# Patient Record
Sex: Female | Born: 1950 | Race: White | Hispanic: No | Marital: Married | Smoking: Never smoker
Health system: Southern US, Community
[De-identification: ages and names within clinical notes are randomized; demographics above are authoritative.]

## PROBLEM LIST (undated history)

## (undated) DIAGNOSIS — M545 Low back pain, unspecified: Secondary | ICD-10-CM

## (undated) DIAGNOSIS — R87810 Cervical high risk human papillomavirus (HPV) DNA test positive: Secondary | ICD-10-CM

## (undated) DIAGNOSIS — M1990 Unspecified osteoarthritis, unspecified site: Secondary | ICD-10-CM

## (undated) DIAGNOSIS — N319 Neuromuscular dysfunction of bladder, unspecified: Secondary | ICD-10-CM

## (undated) DIAGNOSIS — E119 Type 2 diabetes mellitus without complications: Secondary | ICD-10-CM

## (undated) DIAGNOSIS — F419 Anxiety disorder, unspecified: Secondary | ICD-10-CM

## (undated) DIAGNOSIS — F329 Major depressive disorder, single episode, unspecified: Secondary | ICD-10-CM

## (undated) DIAGNOSIS — I509 Heart failure, unspecified: Secondary | ICD-10-CM

## (undated) DIAGNOSIS — N189 Chronic kidney disease, unspecified: Secondary | ICD-10-CM

## (undated) DIAGNOSIS — G8929 Other chronic pain: Secondary | ICD-10-CM

## (undated) DIAGNOSIS — F32A Depression, unspecified: Secondary | ICD-10-CM

## (undated) DIAGNOSIS — Z950 Presence of cardiac pacemaker: Secondary | ICD-10-CM

## (undated) DIAGNOSIS — K219 Gastro-esophageal reflux disease without esophagitis: Secondary | ICD-10-CM

## (undated) DIAGNOSIS — K5900 Constipation, unspecified: Secondary | ICD-10-CM

## (undated) DIAGNOSIS — E7800 Pure hypercholesterolemia, unspecified: Secondary | ICD-10-CM

## (undated) DIAGNOSIS — R011 Cardiac murmur, unspecified: Secondary | ICD-10-CM

## (undated) DIAGNOSIS — I10 Essential (primary) hypertension: Secondary | ICD-10-CM

## (undated) DIAGNOSIS — K5909 Other constipation: Secondary | ICD-10-CM

## (undated) HISTORY — DX: Cervical high risk human papillomavirus (HPV) DNA test positive: R87.810

## (undated) HISTORY — PX: SIGMOIDOSCOPY: SUR1295

## (undated) HISTORY — PX: UPPER GASTROINTESTINAL ENDOSCOPY: SHX188

## (undated) HISTORY — PX: CATARACT EXTRACTION: SUR2

## (undated) HISTORY — PX: HERNIA REPAIR: SHX51

## (undated) HISTORY — DX: Essential (primary) hypertension: I10

## (undated) HISTORY — DX: Gastro-esophageal reflux disease without esophagitis: K21.9

## (undated) HISTORY — DX: Other constipation: K59.09

## (undated) HISTORY — PX: BREAST EXCISIONAL BIOPSY: SUR124

## (undated) HISTORY — PX: WISDOM TOOTH EXTRACTION: SHX21

## (undated) HISTORY — PX: POSTERIOR LUMBAR FUSION: SHX6036

## (undated) HISTORY — PX: CHOLECYSTECTOMY: SHX55

## (undated) HISTORY — DX: Constipation, unspecified: K59.00

## (undated) HISTORY — DX: Pure hypercholesterolemia, unspecified: E78.00

## (undated) HISTORY — PX: TUBAL LIGATION: SHX77

---
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
## 2001-11-01 ENCOUNTER — Other Ambulatory Visit: Admission: RE | Admit: 2001-11-01 | Discharge: 2001-11-01 | Payer: Self-pay | Admitting: Obstetrics and Gynecology

## 2001-11-01 ENCOUNTER — Ambulatory Visit (HOSPITAL_COMMUNITY): Admission: RE | Admit: 2001-11-01 | Discharge: 2001-11-01 | Payer: Self-pay | Admitting: Obstetrics and Gynecology

## 2001-11-01 ENCOUNTER — Encounter: Payer: Self-pay | Admitting: Obstetrics and Gynecology

## 2001-11-03 ENCOUNTER — Ambulatory Visit (HOSPITAL_COMMUNITY): Admission: RE | Admit: 2001-11-03 | Discharge: 2001-11-03 | Payer: Self-pay | Admitting: Obstetrics and Gynecology

## 2001-11-03 ENCOUNTER — Encounter: Payer: Self-pay | Admitting: Obstetrics and Gynecology

## 2002-08-15 ENCOUNTER — Ambulatory Visit (HOSPITAL_COMMUNITY): Admission: RE | Admit: 2002-08-15 | Discharge: 2002-08-15 | Payer: Self-pay | Admitting: Pulmonary Disease

## 2002-08-22 ENCOUNTER — Ambulatory Visit (HOSPITAL_COMMUNITY): Admission: RE | Admit: 2002-08-22 | Discharge: 2002-08-22 | Payer: Self-pay | Admitting: Pulmonary Disease

## 2002-09-12 ENCOUNTER — Ambulatory Visit (HOSPITAL_COMMUNITY): Admission: RE | Admit: 2002-09-12 | Discharge: 2002-09-12 | Payer: Self-pay | Admitting: Cardiology

## 2002-11-01 ENCOUNTER — Ambulatory Visit (HOSPITAL_COMMUNITY): Admission: RE | Admit: 2002-11-01 | Discharge: 2002-11-01 | Payer: Self-pay | Admitting: Obstetrics and Gynecology

## 2002-11-01 ENCOUNTER — Encounter: Payer: Self-pay | Admitting: Obstetrics and Gynecology

## 2002-11-02 ENCOUNTER — Encounter: Payer: Self-pay | Admitting: Obstetrics and Gynecology

## 2002-11-02 ENCOUNTER — Ambulatory Visit (HOSPITAL_COMMUNITY): Admission: RE | Admit: 2002-11-02 | Discharge: 2002-11-02 | Payer: Self-pay | Admitting: Obstetrics and Gynecology

## 2003-01-25 ENCOUNTER — Ambulatory Visit (HOSPITAL_COMMUNITY): Admission: RE | Admit: 2003-01-25 | Discharge: 2003-01-25 | Payer: Self-pay | Admitting: Pulmonary Disease

## 2003-04-30 ENCOUNTER — Ambulatory Visit (HOSPITAL_COMMUNITY): Admission: RE | Admit: 2003-04-30 | Discharge: 2003-04-30 | Payer: Self-pay | Admitting: *Deleted

## 2003-06-15 ENCOUNTER — Ambulatory Visit (HOSPITAL_COMMUNITY): Admission: RE | Admit: 2003-06-15 | Discharge: 2003-06-15 | Payer: Self-pay | Admitting: Pulmonary Disease

## 2003-07-13 ENCOUNTER — Encounter: Admission: RE | Admit: 2003-07-13 | Discharge: 2003-07-13 | Payer: Self-pay | Admitting: Neurosurgery

## 2003-07-31 ENCOUNTER — Encounter: Admission: RE | Admit: 2003-07-31 | Discharge: 2003-07-31 | Payer: Self-pay | Admitting: Neurosurgery

## 2003-09-15 HISTORY — PX: BACK SURGERY: SHX140

## 2003-10-03 ENCOUNTER — Inpatient Hospital Stay (HOSPITAL_COMMUNITY): Admission: RE | Admit: 2003-10-03 | Discharge: 2003-10-05 | Payer: Self-pay | Admitting: Neurosurgery

## 2003-11-13 ENCOUNTER — Ambulatory Visit (HOSPITAL_COMMUNITY): Admission: RE | Admit: 2003-11-13 | Discharge: 2003-11-13 | Payer: Self-pay | Admitting: Obstetrics and Gynecology

## 2003-11-14 ENCOUNTER — Ambulatory Visit (HOSPITAL_COMMUNITY): Admission: RE | Admit: 2003-11-14 | Discharge: 2003-11-14 | Payer: Self-pay | Admitting: Obstetrics and Gynecology

## 2004-03-19 ENCOUNTER — Encounter (HOSPITAL_COMMUNITY): Admission: RE | Admit: 2004-03-19 | Discharge: 2004-04-18 | Payer: Self-pay | Admitting: Neurosurgery

## 2004-08-28 ENCOUNTER — Ambulatory Visit: Payer: Self-pay | Admitting: *Deleted

## 2004-09-11 ENCOUNTER — Ambulatory Visit: Payer: Self-pay | Admitting: *Deleted

## 2004-09-14 HISTORY — PX: PACEMAKER INSERTION: SHX728

## 2004-10-13 ENCOUNTER — Ambulatory Visit (HOSPITAL_COMMUNITY): Admission: RE | Admit: 2004-10-13 | Discharge: 2004-10-13 | Payer: Self-pay | Admitting: Pulmonary Disease

## 2004-10-15 ENCOUNTER — Ambulatory Visit: Payer: Self-pay | Admitting: Internal Medicine

## 2004-10-28 ENCOUNTER — Inpatient Hospital Stay (HOSPITAL_COMMUNITY): Admission: RE | Admit: 2004-10-28 | Discharge: 2004-10-29 | Payer: Self-pay | Admitting: Internal Medicine

## 2004-10-28 ENCOUNTER — Ambulatory Visit: Payer: Self-pay | Admitting: Internal Medicine

## 2004-11-12 ENCOUNTER — Ambulatory Visit: Payer: Self-pay

## 2004-11-12 ENCOUNTER — Ambulatory Visit: Payer: Self-pay | Admitting: Internal Medicine

## 2004-12-05 ENCOUNTER — Ambulatory Visit: Payer: Self-pay | Admitting: *Deleted

## 2004-12-05 ENCOUNTER — Encounter: Payer: Self-pay | Admitting: Internal Medicine

## 2005-02-03 ENCOUNTER — Ambulatory Visit: Payer: Self-pay | Admitting: Internal Medicine

## 2005-03-25 ENCOUNTER — Ambulatory Visit: Payer: Self-pay | Admitting: *Deleted

## 2005-03-25 ENCOUNTER — Encounter: Payer: Self-pay | Admitting: Cardiology

## 2005-03-25 ENCOUNTER — Encounter (INDEPENDENT_AMBULATORY_CARE_PROVIDER_SITE_OTHER): Payer: Self-pay | Admitting: *Deleted

## 2005-05-06 ENCOUNTER — Ambulatory Visit: Payer: Self-pay | Admitting: Internal Medicine

## 2005-05-22 ENCOUNTER — Ambulatory Visit: Payer: Self-pay | Admitting: Cardiology

## 2005-05-22 ENCOUNTER — Ambulatory Visit (HOSPITAL_COMMUNITY): Admission: RE | Admit: 2005-05-22 | Discharge: 2005-05-22 | Payer: Self-pay | Admitting: Cardiology

## 2005-05-25 ENCOUNTER — Ambulatory Visit (HOSPITAL_COMMUNITY): Admission: RE | Admit: 2005-05-25 | Discharge: 2005-05-25 | Payer: Self-pay | Admitting: Obstetrics and Gynecology

## 2005-10-27 ENCOUNTER — Ambulatory Visit: Payer: Self-pay | Admitting: *Deleted

## 2005-10-29 ENCOUNTER — Ambulatory Visit: Payer: Self-pay | Admitting: Cardiology

## 2006-05-19 ENCOUNTER — Ambulatory Visit: Payer: Self-pay | Admitting: Cardiology

## 2006-06-17 ENCOUNTER — Ambulatory Visit (HOSPITAL_COMMUNITY): Admission: RE | Admit: 2006-06-17 | Discharge: 2006-06-17 | Payer: Self-pay | Admitting: Obstetrics and Gynecology

## 2006-07-22 ENCOUNTER — Ambulatory Visit: Payer: Self-pay | Admitting: *Deleted

## 2006-08-02 ENCOUNTER — Ambulatory Visit: Payer: Self-pay | Admitting: Cardiovascular Disease

## 2006-10-25 ENCOUNTER — Ambulatory Visit: Payer: Self-pay | Admitting: Cardiovascular Disease

## 2007-01-05 ENCOUNTER — Encounter: Admission: RE | Admit: 2007-01-05 | Discharge: 2007-01-05 | Payer: Self-pay | Admitting: Neurosurgery

## 2007-02-22 ENCOUNTER — Inpatient Hospital Stay (HOSPITAL_COMMUNITY): Admission: RE | Admit: 2007-02-22 | Discharge: 2007-03-01 | Payer: Self-pay | Admitting: Neurosurgery

## 2007-03-30 ENCOUNTER — Ambulatory Visit: Payer: Self-pay | Admitting: Internal Medicine

## 2007-08-21 ENCOUNTER — Inpatient Hospital Stay (HOSPITAL_COMMUNITY): Admission: EM | Admit: 2007-08-21 | Discharge: 2007-08-24 | Payer: Self-pay | Admitting: Emergency Medicine

## 2007-08-22 ENCOUNTER — Ambulatory Visit: Payer: Self-pay | Admitting: Cardiology

## 2007-10-07 ENCOUNTER — Other Ambulatory Visit: Admission: RE | Admit: 2007-10-07 | Discharge: 2007-10-07 | Payer: Self-pay | Admitting: Obstetrics & Gynecology

## 2007-10-11 ENCOUNTER — Ambulatory Visit (HOSPITAL_COMMUNITY): Admission: RE | Admit: 2007-10-11 | Discharge: 2007-10-11 | Payer: Self-pay | Admitting: Obstetrics & Gynecology

## 2008-05-09 ENCOUNTER — Ambulatory Visit: Payer: Self-pay | Admitting: Internal Medicine

## 2008-11-05 ENCOUNTER — Ambulatory Visit (HOSPITAL_COMMUNITY): Admission: RE | Admit: 2008-11-05 | Discharge: 2008-11-05 | Payer: Self-pay | Admitting: Obstetrics and Gynecology

## 2008-11-15 ENCOUNTER — Encounter: Payer: Self-pay | Admitting: Internal Medicine

## 2008-11-20 ENCOUNTER — Other Ambulatory Visit: Admission: RE | Admit: 2008-11-20 | Discharge: 2008-11-20 | Payer: Self-pay | Admitting: Obstetrics and Gynecology

## 2008-11-22 ENCOUNTER — Ambulatory Visit: Payer: Self-pay | Admitting: Cardiology

## 2008-11-27 ENCOUNTER — Encounter: Payer: Self-pay | Admitting: Internal Medicine

## 2008-11-27 ENCOUNTER — Ambulatory Visit: Payer: Self-pay | Admitting: Internal Medicine

## 2008-11-27 DIAGNOSIS — Z950 Presence of cardiac pacemaker: Secondary | ICD-10-CM | POA: Insufficient documentation

## 2008-11-27 DIAGNOSIS — I5042 Chronic combined systolic (congestive) and diastolic (congestive) heart failure: Secondary | ICD-10-CM | POA: Insufficient documentation

## 2008-11-27 DIAGNOSIS — I119 Hypertensive heart disease without heart failure: Secondary | ICD-10-CM

## 2008-11-27 DIAGNOSIS — I5032 Chronic diastolic (congestive) heart failure: Secondary | ICD-10-CM | POA: Insufficient documentation

## 2009-05-22 ENCOUNTER — Ambulatory Visit: Payer: Self-pay | Admitting: Internal Medicine

## 2009-05-22 DIAGNOSIS — I426 Alcoholic cardiomyopathy: Secondary | ICD-10-CM | POA: Insufficient documentation

## 2009-05-22 DIAGNOSIS — I428 Other cardiomyopathies: Secondary | ICD-10-CM

## 2009-05-24 LAB — CONVERTED CEMR LAB
Free T4: 0.5 ng/dL — ABNORMAL LOW (ref 0.6–1.6)
TSH: 1.6 microintl units/mL (ref 0.35–5.50)

## 2009-08-28 ENCOUNTER — Ambulatory Visit (HOSPITAL_COMMUNITY): Admission: RE | Admit: 2009-08-28 | Discharge: 2009-08-28 | Payer: Self-pay | Admitting: Pulmonary Disease

## 2009-11-08 ENCOUNTER — Ambulatory Visit (HOSPITAL_COMMUNITY): Admission: RE | Admit: 2009-11-08 | Discharge: 2009-11-08 | Payer: Self-pay | Admitting: Obstetrics and Gynecology

## 2009-11-28 ENCOUNTER — Other Ambulatory Visit: Admission: RE | Admit: 2009-11-28 | Discharge: 2009-11-28 | Payer: Self-pay | Admitting: Obstetrics and Gynecology

## 2009-12-31 ENCOUNTER — Ambulatory Visit: Payer: Self-pay | Admitting: Gastroenterology

## 2009-12-31 DIAGNOSIS — Z794 Long term (current) use of insulin: Secondary | ICD-10-CM

## 2009-12-31 DIAGNOSIS — E1122 Type 2 diabetes mellitus with diabetic chronic kidney disease: Secondary | ICD-10-CM

## 2009-12-31 DIAGNOSIS — E1165 Type 2 diabetes mellitus with hyperglycemia: Secondary | ICD-10-CM

## 2009-12-31 DIAGNOSIS — E119 Type 2 diabetes mellitus without complications: Secondary | ICD-10-CM | POA: Insufficient documentation

## 2009-12-31 DIAGNOSIS — N183 Chronic kidney disease, stage 3 (moderate): Secondary | ICD-10-CM

## 2009-12-31 DIAGNOSIS — E782 Mixed hyperlipidemia: Secondary | ICD-10-CM

## 2009-12-31 DIAGNOSIS — K5909 Other constipation: Secondary | ICD-10-CM | POA: Insufficient documentation

## 2009-12-31 DIAGNOSIS — K219 Gastro-esophageal reflux disease without esophagitis: Secondary | ICD-10-CM | POA: Insufficient documentation

## 2009-12-31 DIAGNOSIS — F341 Dysthymic disorder: Secondary | ICD-10-CM

## 2009-12-31 DIAGNOSIS — G8929 Other chronic pain: Secondary | ICD-10-CM | POA: Insufficient documentation

## 2009-12-31 DIAGNOSIS — M549 Dorsalgia, unspecified: Secondary | ICD-10-CM

## 2010-01-01 ENCOUNTER — Encounter: Payer: Self-pay | Admitting: Gastroenterology

## 2010-01-07 ENCOUNTER — Telehealth (INDEPENDENT_AMBULATORY_CARE_PROVIDER_SITE_OTHER): Payer: Self-pay

## 2010-01-08 ENCOUNTER — Ambulatory Visit: Payer: Self-pay | Admitting: Cardiology

## 2010-01-08 ENCOUNTER — Encounter: Payer: Self-pay | Admitting: Urgent Care

## 2010-01-08 ENCOUNTER — Encounter: Payer: Self-pay | Admitting: Internal Medicine

## 2010-01-13 ENCOUNTER — Telehealth (INDEPENDENT_AMBULATORY_CARE_PROVIDER_SITE_OTHER): Payer: Self-pay

## 2010-01-21 ENCOUNTER — Ambulatory Visit (HOSPITAL_COMMUNITY): Admission: RE | Admit: 2010-01-21 | Discharge: 2010-01-21 | Payer: Self-pay | Admitting: Gastroenterology

## 2010-01-21 ENCOUNTER — Ambulatory Visit: Payer: Self-pay | Admitting: Gastroenterology

## 2010-01-23 ENCOUNTER — Encounter: Payer: Self-pay | Admitting: Gastroenterology

## 2010-01-29 ENCOUNTER — Encounter: Payer: Self-pay | Admitting: Urgent Care

## 2010-02-05 ENCOUNTER — Telehealth (INDEPENDENT_AMBULATORY_CARE_PROVIDER_SITE_OTHER): Payer: Self-pay

## 2010-03-31 ENCOUNTER — Telehealth (INDEPENDENT_AMBULATORY_CARE_PROVIDER_SITE_OTHER): Payer: Self-pay

## 2010-04-22 ENCOUNTER — Telehealth (INDEPENDENT_AMBULATORY_CARE_PROVIDER_SITE_OTHER): Payer: Self-pay

## 2010-04-23 ENCOUNTER — Emergency Department (HOSPITAL_COMMUNITY): Admission: EM | Admit: 2010-04-23 | Discharge: 2010-04-23 | Payer: Self-pay | Admitting: Emergency Medicine

## 2010-05-14 ENCOUNTER — Telehealth (INDEPENDENT_AMBULATORY_CARE_PROVIDER_SITE_OTHER): Payer: Self-pay

## 2010-06-03 ENCOUNTER — Encounter: Payer: Self-pay | Admitting: Gastroenterology

## 2010-06-04 ENCOUNTER — Encounter: Payer: Self-pay | Admitting: Gastroenterology

## 2010-06-10 ENCOUNTER — Encounter (INDEPENDENT_AMBULATORY_CARE_PROVIDER_SITE_OTHER): Payer: Self-pay | Admitting: *Deleted

## 2010-07-11 ENCOUNTER — Encounter (INDEPENDENT_AMBULATORY_CARE_PROVIDER_SITE_OTHER): Payer: Self-pay | Admitting: *Deleted

## 2010-07-29 ENCOUNTER — Ambulatory Visit: Payer: Self-pay | Admitting: Internal Medicine

## 2010-10-05 ENCOUNTER — Encounter: Payer: Self-pay | Admitting: Neurology

## 2010-10-05 ENCOUNTER — Encounter: Payer: Self-pay | Admitting: Obstetrics and Gynecology

## 2010-10-07 ENCOUNTER — Encounter: Payer: Self-pay | Admitting: Urgent Care

## 2010-10-07 ENCOUNTER — Other Ambulatory Visit: Payer: Self-pay | Admitting: Obstetrics and Gynecology

## 2010-10-07 DIAGNOSIS — Z139 Encounter for screening, unspecified: Secondary | ICD-10-CM

## 2010-10-10 ENCOUNTER — Encounter (INDEPENDENT_AMBULATORY_CARE_PROVIDER_SITE_OTHER): Payer: Self-pay | Admitting: *Deleted

## 2010-10-14 NOTE — Procedures (Signed)
Summary: pc2 check [place]/sl  Medications Added ZOLPIDEM TARTRATE 10 MG TABS (ZOLPIDEM TARTRATE) 2 by mouth at bedtime SEROQUEL 300 MG TABS (QUETIAPINE FUMARATE) 2 by mouth at bedtime      Allergies Added: NKDA  Current Medications (verified): 1)  Effexor Xr 150 Mg Xr24h-Cap (Venlafaxine Hcl) .[name].. 1 By Mouth Two Times A Day 2)  Glipizide 5 Mg Tabs (Glipizide) .[name].. 1 By Mouth Three Times A Day 3)  Aspirin 81 Mg Tbec (Aspirin) .... Take One Tablet By Mouth Daily 4)  Omeprazole 20 Mg Cpdr (Omeprazole) .[name].. 1 By Mouth Once Daily 5)  Furosemide 40 Mg Tabs (Furosemide) .... Take One Tablet By Mouth Three Times A Day 6)  Digoxin 0.125 Mg Tabs (Digoxin) .... Take 1 Tablet By Mouth Daily 7)  Pravastatin Sodium 40 Mg Tabs (Pravastatin Sodium) .... Take One Tablet By Mouth Daily At Bedtime 8)  Zolpidem Tartrate 10 Mg Tabs (Zolpidem Tartrate) .... 2 By Mouth At Bedtime 9)  Clonazepam 0.5 Mg Tabs (Clonazepam) .[name].. 1 By Mouth Three Times A Day Prn 10)  Hydrocodone-Acetaminophen 5-500 Mg Tabs (Hydrocodone-Acetaminophen) .... Every 4 To 6 Hours As Needed 11)  Metformin Hcl 500 Mg Tabs (Metformin Hcl) .[name].. 1 By Mouth Three Times A Day 12)  Seroquel 300 Mg Tabs (Quetiapine Fumarate) .... 2 By Mouth At Bedtime 13)  Klor-Con M20 20 Meq Cr-Tabs (Potassium Chloride Crys Cr) .[name].. 1 By Mouth Two Times A Day 14)  Budeprion Xl 300 Mg Xr24h-Tab (Bupropion Hcl) .[name].. 1 By Mouth Once Daily 15)  Amitiza 8 Mcg Caps (Lubiprostone) .[name].. 1 By Mouth Daily or Two Times A Day W/ Food As Needed For Constipation  Allergies (verified): No Known Drug Allergies   PPM Specifications Following MD:  Bing Quarry, MD     PPM Vendor:  Medtronic     PPM Model Number:  [id number]     PPM Serial Number:  [id number] PPM DOI:  10/28/2004      Lead 1    Location: RA     DOI: 10/28/2004     Model #: [id number]     Serial #: [id number]     Status: active Lead 2    Location: RV     DOI: 10/28/2004     Model #: [id number]     Serial #[id number]      Status: active  Magnet Response Rate:  BOL 85 ERI  65  Indications:  NICM   PPM Follow Up Remote Check?  No Battery Voltage:  2.978 V     Battery Est. Longevity:  5 years     Pacer Dependent:  No       PPM Device Measurements Atrium  Amplitude: 8.0 mV, Impedance: 576 ohms, Threshold: 0.5 V at 0.4 msec Right Ventricle  Amplitude: 11.20 mV, Impedance: 449 ohms, Threshold: 1.0 V at 0.4 msec  Episodes MS Episodes:  76     Percent Mode Switch:  0%     Coumadin:  No Ventricular High Rate:  23     Atrial Pacing:  0.2%     Ventricular Pacing:  30.2%  Parameters Mode:  DDD     Lower Rate Limit:  60     Upper Rate Limit:  130 Paced AV Delay:  180     Sensed AV Delay:  180 Next Cardiology Appt Due:  06/14/2010 Tech Comments:  No parameter changes.  Device function normal.  Her rhythm today was sinus tachy at a rate of 103bpm.  She did have 23  VHR/AHR episodes noted all 1:1 Total of 4.8% heart rates >130bpm.  ROV 6 months with Dr. Lovena Le in [facility]. Alma Friendly, LPN  April 27, 624THL D34-534 PM  MD Comments:  Agree with above.

## 2010-10-14 NOTE — Progress Notes (Signed)
Summary: samples  Phone Note Call from Patient Call back at Home Phone [phone]   Caller: Patient Summary of Call: pt came to office gave #4 boxes of Amitiza samples Initial call taken by: Burnadette Peter LPN,  August 31, 624THL 4:03 PM     Appended Document: samples 23mcg Amitiza

## 2010-10-14 NOTE — Progress Notes (Signed)
Summary: amitiza  Phone Note Call from Patient Call back at [facility] Phone [phone]   Caller: Patient Summary of Call: pt came by office- she is unable to get the amitiza due to her insurance- it will cost over $100.00 because she has reached her "doughnut hole" already for this year. wants to know if there is anything cheaper she can take? gave pt 3 boxes of samples. please advise Initial call taken by: Burnadette Peter LPN,  April 26, 624THL 3:43 PM     Appended Document: Baker Pierini If it works well, let's just see if we can get samples for her if we have.  Thanks  Appended Document: amitiza informed pt we would try to keep her in samples as much as we can. asked her to call me when she gets down to her last box.

## 2010-10-14 NOTE — Progress Notes (Signed)
Summary: samples  Phone Note Call from Patient Call back at Home Phone [phone]   Caller: Patient Summary of Call: pt came in requesting samples- gave her 4 boxes of Amitiza 32mcg Initial call taken by: Burnadette Peter LPN,  August  9, 624THL 3:58 PM

## 2010-10-14 NOTE — Medication Information (Signed)
Summary: OMEPRAZOLE DR  OMEPRAZOLE DR   Imported By: Hoy Morn 06/04/2010 14:32:40  _____________________________________________________________________  External Attachment:    Type:   Image     Comment:   External Document  Appended Document: OMEPRAZOLE DR duplicate

## 2010-10-14 NOTE — Letter (Signed)
Summary: External Other  External Other   Imported By: Sherry Ruffing 01/08/2010 10:14:25  _____________________________________________________________________  External Attachment:    Type:   Image     Comment:   External Document

## 2010-10-14 NOTE — Cardiovascular Report (Signed)
Summary: Office Visit   Office Visit   Imported By: Sallee Provencal 01/28/2010 11:14:29  _____________________________________________________________________  External Attachment:    Type:   Image     Comment:   External Document

## 2010-10-14 NOTE — Assessment & Plan Note (Signed)
Summary: pc2/sl  Medications Added FUROSEMIDE 40 MG TABS (FUROSEMIDE) two tablet once daily MIRALAX  POWD (POLYETHYLENE GLYCOL 3350) take every morning      Allergies Added: NKDA  Visit Type:  Follow-up Referring Provider:  Criss Alvine, NP Primary Provider:  Dr. Luan Pulling  CC:  no cardiology complaints.  History of Present Illness: [name] is a middle aged woman with a non-ischemic CM, EF 40% and LBBB who underwent Bi-V PM implant in 2006.  At that time a LV lead could not be placed.  She has done well over the past year.  She denies c/p or sob and continues to exercise several times a week.  No peripheral edema or syncope. She has lost over 50 lbs. by exercising.  Current Medications (verified): 1)  Effexor Xr 150 Mg Xr24h-Cap (Venlafaxine Hcl) .[name].. 1 By Mouth Two Times A Day 2)  Glipizide 5 Mg Tabs (Glipizide) .[name].. 1 By Mouth Three Times A Day 3)  Aspirin 81 Mg Tbec (Aspirin) .... Take One Tablet By Mouth Daily 4)  Omeprazole 20 Mg Cpdr (Omeprazole) .... One By Mouth At 7am and 5pm Daily 5)  Furosemide 40 Mg Tabs (Furosemide) .... Two Tablet Once Daily 6)  Digoxin 0.125 Mg Tabs (Digoxin) .... Take 1 Tablet By Mouth Daily 7)  Pravastatin Sodium 40 Mg Tabs (Pravastatin Sodium) .... Take One Tablet By Mouth Daily At Bedtime 8)  Zolpidem Tartrate 10 Mg Tabs (Zolpidem Tartrate) .... 2 By Mouth At Bedtime 9)  Clonazepam 0.5 Mg Tabs (Clonazepam) .[name].. 1 By Mouth Three Times A Day Prn 10)  Hydrocodone-Acetaminophen 5-500 Mg Tabs (Hydrocodone-Acetaminophen) .... Every 4 To 6 Hours As Needed 11)  Metformin Hcl 500 Mg Tabs (Metformin Hcl) .[name].. 1 By Mouth Three Times A Day 12)  Klor-Con M20 20 Meq Cr-Tabs (Potassium Chloride Crys Cr) .[name].. 1 By Mouth Two Times A Day 13)  Budeprion Xl 300 Mg Xr24h-Tab (Bupropion Hcl) .[name].. 1 By Mouth Once Daily 14)  Miralax  Powd (Polyethylene Glycol 3350) .... 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    Vital Signs:  Patient profile:   60 year old female Weight:      145 pounds Pulse rate:   108 / minute BP sitting:   142 / 89  (right arm)  Vitals Entered By: Melanie Sou, CNA (July 29, 2010 3:48 PM)  Physical Exam  General:  Well developed, well nourished, no acute distress. Head:  Normocephalic and atraumatic. Eyes:  Sclera clear, no icterus. Mouth:  No deformity or lesions, dentition normal. Neck:  Supple; no masses or thyromegaly. Lungs:  Clear throughout to auscultation. Heart:  Regular rate and rhythm; no murmurs, rubs,  or bruits. Abdomen:  Soft, nontender and nondistended. No masses, hepatosplenomegaly or hernias noted. Normal bowel sounds.without guarding and without rebound.   Msk:  Symmetrical with no gross deformities. Normal posture. Pulses:  Normal pulses noted. Extremities:  No clubbing, cyanosis, edema or deformities noted. Neurologic:  Alert and  oriented x4;  grossly normal neurologically.  PPM Specifications Following MD:  Cristopher Peru, MD     PPM Vendor:  Medtronic     PPM Model Number:  [id number]     PPM Serial Number:  [id number] PPM DOI:  10/28/2004      Lead 1    Location: RA     DOI: 10/28/2004     Model #: [id number]     Serial #: [id number]     Status: active Lead 2    Location: RV     DOI: 10/28/2004     Model #: [id number]     Serial #: [id number]     Status:  active  Magnet Response Rate:  BOL 85 ERI  65  Indications:  NICM   PPM Follow Up Remote Check?  No Battery Voltage:  2.969 V     Battery Est. Longevity:  4.5 years     Pacer Dependent:  No       PPM Device Measurements Atrium  Amplitude: 2.8 mV, Impedance: 605 ohms, Threshold: 0.5 V at 0.4 msec Right Ventricle  Amplitude: 11.20 mV, Impedance: 502 ohms, Threshold: 1.0 V at 0.4 msec  Episodes MS Episodes:  70     Percent Mode Switch:  1%     Coumadin:  No Ventricular High Rate:  32     Atrial Pacing:  0.1%     Ventricular Pacing:  22.7%  Parameters Mode:  DDD     Lower Rate Limit:  60     Upper Rate Limit:  130 Paced AV Delay:  180     Sensed AV Delay:  180 Next Cardiology Appt Due:  01/13/2011 Tech Comments:  No parameter changes.  Device function normal.  ROV 6 months [facility] clinic. Alma Friendly, LPN  November 15, 624THL 4:12 PM  MD Comments:  Agree with above.  Impression & Recommendations:  Problem # 1:  PACEMAKER, PERMANENT (ICD-V45.01) Her device is working normally. Will recheck her device in several months.  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  She will continue her very ambitious exercise program and her meds as below. Also a low sodium diet is requested. Her updated medication list for this problem includes:    Aspirin 81 Mg Tbec (Aspirin) .[name]... Take one tablet by mouth daily    Furosemide 40 Mg Tabs (Furosemide) .[name]..[name] Two tablet once daily    Digoxin 0.125 Mg Tabs (Digoxin) .[name]... Take 1 tablet by mouth daily  Problem # 3:  HYPERTENSION, HEART CONTROLLED W/O ASSOC CHF (ICD-402.10) Her blood pressure is well controlled.  She will continue her current meds and low sodium diet. Her updted medication list for this problem includes:    Aspirin 81 Mg Tbec (Aspirin) .[name]... Take one tablet by mouth daily    Furosemide 40 Mg Tabs (Furosemide) .[name]..[name] Two tablet once daily

## 2010-10-14 NOTE — Progress Notes (Signed)
Summary: samples  Phone Note Call from Patient Call back at Home Phone [phone]   Caller: Patient Summary of Call: pt came by for samples of amitiza 63mcg. gave 4 boxes Initial call taken by: Burnadette Peter LPN,  May 25, 624THL 624THL PM

## 2010-10-14 NOTE — Progress Notes (Signed)
Summary: samples  Phone Note Call from Patient   Summary of Call: SS gave pt #4 boxes of amitizia samples Initial call taken by: Burnadette Peter LPN,  July 18, 624THL 624THL PM     Appended Document: samples 8 micrograms

## 2010-10-14 NOTE — Progress Notes (Signed)
Summary: amitiza samples  Phone Note Call from Patient Call back at Home Phone [phone]   Caller: Patient Summary of Call: pt came by got #4 boxes of Amitiza samples. Initial call taken by: Burnadette Peter LPN,  May  2, 624THL D34-534 PM     Appended Document: amitiza samples Amitiza 24mcg (#4 boxes) ok

## 2010-10-14 NOTE — Assessment & Plan Note (Signed)
Summary: CONSTIPATION   Referring Provider:  Criss Alvine, NP Primary Care Provider:  Dr. Luan Pulling  Chief Complaint:  constipation and needs tcs.  History of Present Illness: Pt here to set up colonoscopy.  Hx chronic constipation.  BM 2 per week.  Takes miralax 17 grams nightlly.  Denies rectal bleeding or melena.  Denies any abd pain.  The patient denies any upper GI symptoms which incluse nausea, vomiting, heartburn, indigestion, dysphagia, odynophagia, anorexia or early satiety.  Takes omeprazole once daily x 1 year.  Previously everyday symptoms.  Never had screening colonoscopy nor EGD.  Preventive Screening-Counseling & Management  Caffeine-Diet-Exercise     Does Patient Exercise: yes  Current Problems (verified): 1)  Gastroesophageal Reflux Disease, Chronic  (ICD-530.81) 2)  Hypercholesterolemia  (ICD-272.0) 3)  Constipation, Chronic  (ICD-564.09) 4)  Anxiety Depression  (ICD-300.4) 5)  Back Pain, Chronic  (ICD-724.5) 6)  Dm  (ICD-250.00) 7)  Cardiomyopathy  (ICD-425.4) 8)  Pacemaker  (ICD-V45.[name]01) 9)  Hypertension, Heart Controlled w/o Assoc CHF  (ICD-402.10) 10)  Combined Heart Failure, Chronic  (ICD-428.42)  Current Medications (verified): 1)  Effexor Xr 150 Mg Xr24h-Cap (Venlafaxine Hcl) .[name].. 1 By Mouth Two Times A Day 2)  Glipizide 5 Mg Tabs (Glipizide) .[name].. 1 By Mouth Three Times A Day 3)  Aspirin 81 Mg Tbec (Aspirin) .... Take One Tablet By Mouth Daily 4)  Omeprazole 20 Mg Cpdr (Omeprazole) .[name].. 1 By Mouth Once Daily 5)  Furosemide 40 Mg Tabs (Furosemide) .... Take One Tablet By Mouth Three Times A Day 6)  Digoxin 0.125 Mg Tabs (Digoxin) .... Take 1 Tablet By Mouth Daily 7)  Pravastatin Sodium 40 Mg Tabs (Pravastatin Sodium) .... Take One Tablet By Mouth Daily At Bedtime 8)  Zolpidem Tartrate 10 Mg Tabs (Zolpidem Tartrate) .[name].. 1 By Mouth At Bedtime 9)  Clonazepam 0.5 Mg Tabs (Clonazepam) .[name].. 1 By Mouth Three Times A Day Prn 10)  Hydrocodone-Acetaminophen  5-500 Mg Tabs (Hydrocodone-Acetaminophen) .... Every 4 To 6 Hours As Needed 11)  Metformin Hcl 500 Mg Tabs (Metformin Hcl) .[name].. 1 By Mouth Three Times A Day 12)  Seroquel 300 Mg Tabs (Quetiapine Fumarate) .[name].. 1 By Mouth At Bedtime 13)  Klor-Con M20 20 Meq Cr-Tabs (Potassium Chloride Crys Cr) .[name].. 1 By Mouth Two Times A Day 14)  Budeprion Xl 300 Mg Xr24h-Tab (Bupropion Hcl) .[name].. 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 Father: (deceased) lung CA Mother: (104) DM Siblings: 2 sisters, 1 brother healthy  Social History: No  tobacco, drinks 2 glasses wine/night married X 58 yrs Lost only son to [place] 2001 worked for [employer] previously, disabled depression Patient gets regular exercise. Does Patient Exercise:  yes  Review of Systems General:  Complains of fatigue, weight loss, and sleep disorder; denies fever, chills, sweats, weakness, and malaise; 58# intentionally over 1 year, exercises 5 days per wk. CV:  Denies chest pains, angina, palpitations, syncope, dyspnea on exertion, orthopnea, PND, peripheral edema, and claudication. Resp:  Denies dyspnea at rest, dyspnea with exercise, cough, sputum, wheezing, coughing up blood, and pleurisy. GI:  Denies jaundice, diarrhea, and fecal incontinence. GU:  Denies urinary burning, blood in urine, nocturnal urination, urinary frequency, and urinary incontinence. Derm:  Denies rash, itching, dry skin, hives, moles, warts, and unhealing  ulcers. Psych:  Denies depression, anxiety, memory loss, suicidal ideation, hallucinations, paranoia, phobia, and confusion. Heme:  Complains of bruising; denies bleeding and enlarged lymph nodes.  Vital Signs:  Patient profile:   60 year old female Height:      61 inches Weight:      154 pounds BMI:     29.20 Temp:     98.0 degrees F oral Pulse rate:   96 / minute BP sitting:   150 / 80  (right arm) Cuff size:   regular  Vitals Entered By: Burnadette Peter LPN (April 19, 624THL 075-GRM AM)  Physical Exam  General:  Well developed, well nourished, no acute distress. Head:  Normocephalic and atraumatic. Eyes:  Sclera clear, no icterus. Ears:  Normal auditory acuity. Nose:  No deformity, discharge,  or lesions. Mouth:  No deformity or lesions, dentition normal. Neck:  Supple; no masses or thyromegaly. Lungs:  Clear throughout to auscultation. Heart:  Regular rate and rhythm; no murmurs, rubs,  or bruits. Abdomen:  Soft, nontender and nondistended. No masses, hepatosplenomegaly or hernias noted. Normal bowel sounds.without guarding and without rebound.   Msk:  Symmetrical with no gross deformities. Normal posture. Pulses:  Normal pulses noted. Extremities:  No clubbing, cyanosis, edema or deformities noted. Neurologic:  Alert and  oriented x4;  grossly normal neurologically. Skin:  Intact without significant lesions or rashes. Cervical Nodes:  No significant cervical adenopathy. Psych:  Alert and cooperative. Normal mood and affect.  Impression & Recommendations:  Problem # 1:  CONSTIPATION, CHRONIC (ICD-564.09) 60 y/o caucasian female w/chronic constipation who needs a screening colonoscopy. She is on as needed narcotics and psychoactive polypharmacy.  Will discuss whether she will need sedation in OR w/ propofol given this history.   Screening colonoscopy/EGD to be performed by Dr. Barney Drain in the near future.  I have discussed risks and benefits which include, but are not  limited to, bleeding, infection, perforation, or medication reaction.  The patient agrees with this plan and consent will be obtained.  Orders: Consultation Level III [id number]  Problem # 2:  GASTROESOPHAGEAL REFLUX DISEASE, CHRONIC (ICD-530.81) Well controlled on PPI.  Will also offer EGD to screen for Barrett's esophagus at the same time given hx chronic GERD over one year.  Patient Instructions: 1)  Constipation literature 2)  Begin amitiza 31mcg daily or two times a day w/ food for constipation (2 boxes samples given) Prescriptions: AMITIZA 8 MCG CAPS (LUBIPROSTONE) 1 by mouth daily or two times a day w/ food as needed for constipation  #62 x 1   Entered and Authorized by:   Andria Meuse FNP-BC   Signed by:   Andria Meuse FNP-BC on 12/31/2009   Method used:   Electronically to        [facility] (retail)       [address]       [place], [place]  [address]       Ph: [phone] or [phone]       Fax: [phone]   RxID:   [id number]

## 2010-10-14 NOTE — Medication Information (Signed)
Summary: RX Folder  RX Folder   Imported By: Sherry Ruffing 01/23/2010 12:58:38  _____________________________________________________________________  External Attachment:    Type:   Image     Comment:   External Document  Appended Document: RX Folder - omeprazole    Prescriptions: OMEPRAZOLE 20 MG CPDR (OMEPRAZOLE) one by mouth at 7am and 5pm daily  #60 x 3   Entered and Authorized by:   Laureen Ochs. Bernarda Caffey   Signed by:   Laureen Ochs Bernarda Caffey on 01/23/2010   Method used:   Electronically to        [facility] (retail)       [address]       [place], [place]  [address]       Ph: [phone] or [phone]       Fax: [phone]   RxID:   [id number]

## 2010-10-14 NOTE — Letter (Signed)
Summary: TCS/EGD ORDER  TCS/EGD ORDER   Imported By: Sofie Rower 01/01/2010 15:15:52  _____________________________________________________________________  External Attachment:    Type:   Image     Comment:   External Document

## 2010-10-14 NOTE — Letter (Signed)
Summary: Recall Office Visit  [facility]  [address]   [place], [place] [address]   Phone: [phone]  Fax: [phone]      June 10, 2010   JASEY SHELTON [address] [place], [place]  [address] 1951-01-30   Dear [name],   According to our records, it is time for you to schedule a follow-up office visit with [name].   At your convenience, please call [phone] to schedule an office visit. If you have any questions, concerns, or feel that this letter is in error, we would appreciate your call.   [facility] Ph: [phone]   Fax: [phone]

## 2010-10-14 NOTE — Letter (Signed)
Summary: TCS ORDER  TCS ORDER   Imported By: Sofie Rower 12/31/2009 12:34:12  _____________________________________________________________________  External Attachment:    Type:   Image     Comment:   External Document

## 2010-10-14 NOTE — Miscellaneous (Signed)
Summary: dx correction   Clinical Lists Changes  Problems: Changed problem from PACEMAKER (ICD-V45.[name]01) to PACEMAKER, PERMANENT (ICD-V45.01) changed the incorrect dx code to correct dx code

## 2010-10-14 NOTE — Medication Information (Signed)
Summary: PA for amitiza  PA for amitiza   Imported By: Burnadette Peter LPN [id number]  _____________________________________________________________________  External Attachment:    Type:   Image     Comment:   External Document

## 2010-10-14 NOTE — Medication Information (Signed)
Summary: OMEPRAZOLE DR 20MG   OMEPRAZOLE DR 20MG    Imported By: Hoy Morn 06/03/2010 12:12:28  _____________________________________________________________________  External Attachment:    Type:   Image     Comment:   External Document  Appended Document: OMEPRAZOLE DR 20MG     Prescriptions: OMEPRAZOLE 20 MG CPDR (OMEPRAZOLE) one by mouth at 7am and 5pm daily  #60 x 3   Entered and Authorized by:   Laban Emperor NP   Signed by:   Laban Emperor NP on 06/04/2010   Method used:   Faxed to ...       [facility] (retail)       [address]       [place], [place]  [address]       Ph: [phone] or [phone]       Fax: [phone]   RxID:   [id number]    Pt needs return OV, routine follow-up.  Appended Document: OMEPRAZOLE DR 20MG  Did this get faxed? Vicente Males did not send electronically.  Appended Document: OMEPRAZOLE DR 20MG  Vicente Males [employer] faxed to pharmacy  Appended Document: OMEPRAZOLE DR 20MG  TRIED TO REACH PT NO ANSW OR MACH

## 2010-10-16 NOTE — Letter (Signed)
Summary: Recall Office Visit  [facility]  [address]   [place], [place] [address]   Phone: [phone]  Fax: [phone]      October 10, 2010   [name] [address] [place], [place]  [address] 06/24/1951   Dear [name],   According to our records, it is time for you to schedule a follow-up office visit with [name].   At your convenience, please call [phone] to schedule an office visit. If you have any questions, concerns, or feel that this letter is in error, we would appreciate your call.   Sincerely,    [facility] Ph: [phone]   Fax: [phone]

## 2010-10-16 NOTE — Medication Information (Signed)
Summary: OMEPRAZOLE DR 20MG   OMEPRAZOLE DR 20MG    Imported By: Hoy Morn 10/07/2010 10:34:54  _____________________________________________________________________  External Attachment:    Type:   Image     Comment:   External Document  Appended Document: OMEPRAZOLE DR 20MG  Pt needs OV   Prescriptions: OMEPRAZOLE 20 MG CPDR (OMEPRAZOLE) one by mouth at 7am and 5pm daily  #60 x 0   Entered and Authorized by:   Andria Meuse FNP-BC   Signed by:   Andria Meuse FNP-BC on 10/07/2010   Method used:   Electronically to        [facility] (retail)       [address]       [place], [place]  [address]       Ph: [phone] or [phone]       Fax: [phone]   RxID:   [id number]     Appended Document: OMEPRAZOLE DR 20MG  mailed letter to pt to call and set up OV

## 2010-11-05 ENCOUNTER — Encounter: Payer: Self-pay | Admitting: Gastroenterology

## 2010-11-10 ENCOUNTER — Ambulatory Visit (HOSPITAL_COMMUNITY)
Admission: RE | Admit: 2010-11-10 | Discharge: 2010-11-10 | Disposition: A | Discharge status: Home / Self Care | Payer: Medicare Other | Source: Ambulatory Visit | Attending: Obstetrics and Gynecology | Admitting: Obstetrics and Gynecology

## 2010-11-10 DIAGNOSIS — Z139 Encounter for screening, unspecified: Secondary | ICD-10-CM

## 2010-11-10 DIAGNOSIS — Z1231 Encounter for screening mammogram for malignant neoplasm of breast: Secondary | ICD-10-CM | POA: Insufficient documentation

## 2010-11-11 NOTE — Medication Information (Addendum)
Summary: OMEPRAZOLE DR 20MG   OMEPRAZOLE DR 20MG    Imported By: Hoy Morn 11/05/2010 11:06:59  _____________________________________________________________________  External Attachment:    Type:   Image     Comment:   External Document  Appended Document: OMEPRAZOLE DR 20MG     Prescriptions: OMEPRAZOLE 20 MG CPDR (OMEPRAZOLE) one by mouth at 7am and 5pm daily  #60 x 0   Entered and Authorized by:   Laban Emperor NP   Signed by:   Laban Emperor NP on 11/05/2010   Method used:   Faxed to ...       [facility] (retail)       [address]       [place], [place]  [address]       Ph: [phone] or [phone]       Fax: [phone]   RxID:   [id number]    Last refill. Has been notified by our office she needs return visit. Letter sent 1/27. Please call pt and inform of office policy. Thanks!  Appended Document: OMEPRAZOLE DR 20MG  pt aware, she said it was ok to make ov  Appended Document: OMEPRAZOLE DR 20MG  pt aware of OV on 4/11 @ 3pm w/SF

## 2010-11-28 LAB — CBC
MCH: 31.5 pg (ref 26.0–34.0)
MCV: 92.5 fL (ref 78.0–100.0)
Platelets: 274 10*3/uL (ref 150–400)
RDW: 13.9 % (ref 11.5–15.5)
WBC: 4.5 10*3/uL (ref 4.0–10.5)

## 2010-11-28 LAB — URINE CULTURE
Colony Count: 75000
Culture  Setup Time: 201108102109

## 2010-11-28 LAB — URINALYSIS, ROUTINE W REFLEX MICROSCOPIC
Bilirubin Urine: NEGATIVE
Ketones, ur: NEGATIVE mg/dL
Nitrite: NEGATIVE
Protein, ur: NEGATIVE mg/dL
Urobilinogen, UA: 0.2 mg/dL (ref 0.0–1.0)
pH: 6.5 (ref 5.0–8.0)

## 2010-11-28 LAB — BASIC METABOLIC PANEL
CO2: 27 mEq/L (ref 19–32)
Calcium: 9 mg/dL (ref 8.4–10.5)
Creatinine, Ser: 0.93 mg/dL (ref 0.4–1.2)
GFR calc non Af Amer: >60 mL/min (ref >60)

## 2010-11-28 LAB — DIFFERENTIAL
Basophils Absolute: 0 10*3/uL (ref 0.0–0.1)
Eosinophils Absolute: 0.1 10*3/uL (ref 0.0–0.7)
Eosinophils Relative: 3 % (ref 0–5)
Lymphocytes Relative: 33 % (ref 12–46)

## 2010-11-28 LAB — URINE MICROSCOPIC-ADD ON

## 2010-12-02 LAB — HEMOGLOBIN AND HEMATOCRIT, BLOOD
HCT: 35.5 % — ABNORMAL LOW (ref 36.0–46.0)
Hemoglobin: 12.5 g/dL (ref 12.0–15.0)

## 2010-12-02 LAB — BASIC METABOLIC PANEL
Calcium: 9.6 mg/dL (ref 8.4–10.5)
GFR calc non Af Amer: 50 mL/min — ABNORMAL LOW (ref >60)
Sodium: 136 mEq/L (ref 135–145)

## 2010-12-16 ENCOUNTER — Telehealth: Payer: Self-pay

## 2010-12-16 NOTE — Telephone Encounter (Signed)
May give pt 4 boxes of Amitiza 8 mcg and instruct to take BID. We will not be able to give her samples if she misses her appt in May 2012.

## 2010-12-16 NOTE — Telephone Encounter (Signed)
Pt called to reschedule her appt from 12/24/2010 and rescheduled at end of May. She is leaving town 12/24/2010 and will be gone for 5-6 weeks. She asked for samples of Amitiza 24 mcg. She was supposed to have come in in Sept, but has not done so yet. ( Per her EMR notes she is on Amitiza 8 mcg. Please advise!

## 2010-12-17 NOTE — Telephone Encounter (Signed)
Called and informed pt. She is aware of samples at front and she needs to keep May appt.

## 2010-12-24 ENCOUNTER — Ambulatory Visit: Payer: Medicare Other | Admitting: Gastroenterology

## 2011-01-27 NOTE — Procedures (Signed)
[name], [name] NO.:  [id number]   MEDICAL RECORD NO.:  [id number]          PATIENT TYPE:  INP   LOCATION:  A212                          FACILITY:  [facility]   PHYSICIAN:  Cristopher Estimable. Lattie Haw, MD, FACCDATE OF BIRTH:  Dec 16, 1950   DATE OF PROCEDURE:  08/22/2007  DATE OF DISCHARGE:                                ECHOCARDIOGRAM   REFERRING:  Percell Miller L. Luan Pulling, M.D.   CLINICAL DATA:  [age]-year-old woman with congestive heart failure,  hypertension and diabetes.   M-MODE:  Aorta 2.6, left atrium 3.8, septum 1, posterior wall 0.9, LV  diastole 4.7, LV systole 3.8.   1. Technically adequate echocardiographic study.  2. Normal left atrium, right atrium and right ventricle.  A pacing      wire traverses the right heart.  3. Mild aortic sclerosis; mild calcification of the wall and annulus      of the proximal ascending aorta, which is normal in diameter.  4. Normal tricuspid valve with physiologic regurgitation; estimated RV      systolic pressure at the upper limit of normal.  5. Normal proximal pulmonary artery; pulmonic valve suboptimally      imaged, but appears normal.  6. Normal mitral valve; mild annular calcification.  7. Normal inferior vena cava.  8. Normal left ventricular size; normal wall thickness; abnormal      septal motion with severe hypokinesis.  Overall left ventricular      systolic function is mildly impaired with an estimated ejection      fraction of 0.45.  9. Comparison to prior study of May 28, 2005:  No significant      interval change.      Cristopher Estimable. Lattie Haw, MD, [facility]  Electronically Signed     RMR/MEDQ  D:  08/22/2007  T:  08/23/2007  Job:  [id number]

## 2011-01-27 NOTE — H&P (Signed)
NAMEKEVA, STRID              ACCOUNT NO.:  [id number]   MEDICAL RECORD NO.:  [id number]          PATIENT TYPE:  INP   LOCATION:  A212                          FACILITY:  [facility]   PHYSICIAN:  Edward L. Luan Pulling, M.D.DATE OF BIRTH:  15-Mar-1951   DATE OF ADMISSION:  08/21/2007  DATE OF DISCHARGE:  LH                              HISTORY & PHYSICAL   Ms. Barbot came to the emergency room with bronchitis and fever.  She  was found to have renal dysfunction and also found to have hyponatremia  and hypokalemia.  Her chief complaint in the emergency room was fever.  She said that she started about 10 days ago with fever, chills, aching  all over.  She has been coughing.  Her temperature went to 103.7.  She  had taken a Z-Pak, which did not work.   PAST MEDICAL HISTORY:  1. Diabetes.  2. Neurogenic bladder.  3. A pacemaker.  4. A history of some congestive heart failure that is largely      resolved.   Surgically she has had a back surgery which has left her with a  neurogenic bladder.  She has had a cholecystectomy.   SOCIAL HISTORY:  She has a history of some fairly significant alcohol  use, although apparently she has reduced that in the last several  months.   MEDICATIONS AT HOME:  1. Metformin 500 mg b.i.d.  2. Wellbutrin 300 mg daily.  3. Effexor 150 mg b.i.d.  4. Lasix 40 mg t.i.d.  5. Spironolactone 25 mg daily.  6. Toprol XL 75 mg daily.  7. Klonopin 0.5 mg t.i.d.  8. Aspirin 81 mg daily.  9. Bethanechol 50 mg four times a day.   She had an echocardiogram that showed about a 25% ejection fraction her  earlier this year.   REVIEW OF SYSTEMS:  She has had the cough and congestion.  She has had  extensive aching and fever to 103 as mentioned.   PHYSICAL EXAMINATION TODAY:  A well-developed, well-nourished female who  is in no acute distress now.  Her pupils are equal, round and react to light and accommodation.  She  does not have any icterus.  Her nose and throat  are clear.  Mucous  membranes are dry.  NECK:  Supple without masses.  She does not have any bruits or jugular  venous distention.  HEART:  Regular.  I do not hear a gallop now.  CHEST:  With some rhonchi bilaterally.  ABDOMEN:  Soft.  I do not feel her liver.  She is nontender.  EXTREMITIES:  No edema.  CNS:  Grossly intact.   LAB WORK:  White count 9000, hemoglobin 11.4, platelets 255.  Electrolytes:  Potassium 2.2, sodium was 123, carbon dioxide 23, glucose  256, BUN of 42, creatinine 3.63.  Urine showed some hemoglobin, nitrite  was negative, trace leukocyte esterase.  Microscopic urine showed white  cells and red cells.  Chest x-ray:  No acute abnormalities.   ASSESSMENT:  She has multiple problems.  She now has what may be a viral  illness versus bronchitis.  She is hyponatremic.  She is hypokalemic.  Her renal function is not normal.  She does have diabetes.  At this  point I am going to go ahead and get a repeat echocardiogram.  She was  supposed to have had that done, I believe, in August and apparently it  has not been done.  She is to have an ultrasound of the abdomen to check  her renal function.  She is going to have replacement of her potassium  and I am going to continue with all of her other treatments, hold her  Glucophage, of course.  She is going to be on sliding scale insulin.      Edward L. Luan Pulling, M.D.  Electronically Signed     ELH/MEDQ  D:  08/22/2007  T:  08/22/2007  Job:  [id number]

## 2011-01-27 NOTE — Consult Note (Signed)
[name], [name] NO.:  [id number]   MEDICAL RECORD NO.:  [id number]          PATIENT TYPE:  INP   LOCATION:  3009                         FACILITY:  [facility]   PHYSICIAN:  Lillette Boxer. Dahlstedt, M.D.DATE OF BIRTH:  10-23-1950   DATE OF CONSULTATION:  02/28/2007  DATE OF DISCHARGE:                                 CONSULTATION   BRIEF HISTORY:  A 60 year old female who is status post lumbar surgery 6  days ago.  Catheter was removed postoperatively.  She did not void very  well, and then was found to have a postvoid residual of 2300 mL.  A  Foley catheter is indwelling at present time.  Urologic consultation is  requested.   She does have a longstanding history of some lower abdominal bloating.  She thinks that this may have been due to not voiding well.  She has not  had recurrent urinary tract infections but does have feeling of  incomplete emptying at times.  No urgency, no leakage.   MEDICAL HISTORY:  Significant for breast biopsy.  She has had prior  lumbar surgery before this visit.  She is diabetic.  She has depression.  She is hypertensive.  She has hypercholesterolemia.  She has insomnia.   MEDICATIONS:  Include Glucophage, Wellbutrin, Effexor, Lasix,  spironolactone, Toprol-XL, clonazepam, aspirin, hydrocodone, Seroquel,  Ambien, and Lipitor.   No drug allergies.   Examination was not performed.   A 20-minute discussion was held with the patient about her acute urinary  retention.   IMPRESSION:  Urinary retention, acute - question whether she has had  longstanding issues, as she is diabetic and is on multiple medications  and has constipation.   PLAN:  1. I have ordered magnesium citrate to help her bowels, as she has not      had a bowel movement since she has been in the hospital.  2. I think it is okay to discharge in the morning from urologic      standpoint - would recommend she go home with Urecholine      (prescription written) and  will teach self-catheterization      tomorrow.  She should be okay to do this.  3. I will follow her up next week in the office - she has my card and      instructions to call to set up an appointment.      Lillette Boxer. Dahlstedt, M.D.  Electronically Signed     SMD/MEDQ  D:  02/28/2007  T:  03/01/2007  Job:  [id number]

## 2011-01-27 NOTE — Group Therapy Note (Signed)
[name], [name]              ACCOUNT NO.:  [id number]   MEDICAL RECORD NO.:  [id number]          PATIENT TYPE:  INP   LOCATION:  A212                          FACILITY:  [facility]   PHYSICIAN:  Edward L. Luan Pulling, M.D.DATE OF BIRTH:  04-03-1951   DATE OF PROCEDURE:  DATE OF DISCHARGE:                                 PROGRESS NOTE   [name] says she feels better and the she looks much better.  She  has no new complaints and feels well.   PHYSICAL EXAMINATION:  Temperature 98.5, pulse 89, respirations 20,  blood pressure 106/60, O2 sat 93%.  Her blood sugar is 88.   Her lab work today, potassium 73.7, BUN 15, creatinine 1.63.   ASSESSMENT:  She is better.   PLAN:  To discharge home.  Please see discharge summary for details.      Edward L. Luan Pulling, M.D.  Electronically Signed     ELH/MEDQ  D:  08/24/2007  T:  08/24/2007  Job:  [id number]

## 2011-01-27 NOTE — Op Note (Signed)
[name], [name] NO.:  [id number]   MEDICAL RECORD NO.:  [id number]          PATIENT TYPE:  INP   LOCATION:  3172                         FACILITY:  [facility]   PHYSICIAN:  Leeroy Cha, M.D.   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   PROCEDURE:  Lysis of adhesions, bilateral L4-L5 discectomy, interbody  fusion with cage and autograft BMP, pedicle screws L4-L5, posterolateral  arthrodesis, Cell Saver, C-arm.   SURGEON:  Leeroy Cha, M.D.   ASSISTANT:  Otilio Connors, M.D.   CLINICAL HISTORY:  [name] is a 60 year old female complaining of  back pain with radiation to both legs, right worse than the left.  In  the past, she underwent laminectomy at L4-L5.  Now, she has stenosis at  the level of L4-L5 with spondylolisthesis.  Surgery was advised and the  risks were explained to her in the history and physical.   DESCRIPTION OF PROCEDURE:  The patient was taken to the OR and after  intubation, she was positioned in a prone manner.  The skin was cleaned  with DuraPrep.  Drapes were applied.  A midline incision following the  previous one from the spinous process of L3 down to L4 and L5 was made.  The muscles were retracted all the way laterally.  We identified the  lamina of L3.  The patient previously had a laminectomy, but since then,  there has been overgrowth of the lamina.  Using the drill, we drilled  the lamina of L4 and L5.  The patient has severe adhesion to the point  that 20% of the procedure was spent doing lysis of adhesions.  At the  end, we identified the L4-L5 space and we  removed the facet of L4.  We  went laterally and we entered the disc space, first from the right side  and then from the left side, and using the curet, total discectomy was  achieved.  The endplate was drilled.  Then, we proceeded with insertion  of two cages of 10 x 22 with autograft and BMP inside.  Good position  was achieved.  Then, with the C-arm, first in AP view and later on in  the lateral view, we drilled the pedicle of L4 and L5 and pedicle probes  were followed and in the end, we introduced four pedicle screws of 5.5 x  45.  Good position was achieved seeing the screws with the x-ray.  Then,  the pedicle screws were held in place using a rod plus cap.  From then  on, we went laterally and we drilled the lateral facet of L4-L5 as well  as the transverse process L4-L5.  A mix  of BMP and autograft was used for arthrodesis.  We investigated the  foramen and there was plenty of space  for the L4 and L5 nerve root.  Then, the area was irrigated.  Valsalva maneuver was negative.  She loss  500 mL of blood and 200 were transfused back.  From then on, the wound  was closed with Vicryl and nylon.  The patient did well.           ______________________________  Leeroy Cha, M.D.     EB/MEDQ  D:  02/22/2007  T:  02/22/2007  Job:  [id number]

## 2011-01-27 NOTE — Assessment & Plan Note (Signed)
[facility] OFFICE NOTE   CALISSA, REISLER                     MRN:          [id number]  DATE:05/09/2008                            DOB:          16-Mar-1951    Ms. Chronis returns today for followup.  She is a very pleasant middle-  aged woman with nonischemic cardiomyopathy, EF 30-40%.  She previously  had very severe congestive heart failure, this is improved.  She  underwent attempted BiV pacemaker insertion several years ago with the  LV lead not being able to be placed secondary to a very difficult  anatomy, and despite this, she has done well. She has started an  exercise program, where she walks on a treadmill 4 times a week and also  has been lifting weights.  Since she began this, her dyspnea and  symptoms of heart failure have markedly improved.  She denies chest pain  or shortness of breath.  She is bothered by indigestion.   PHYSICAL EXAMINATION:  GENERAL:  She is a pleasant well-appearing,  middle-aged woman in no distress.  VITAL SIGNS:  Blood pressure 124/75, pulse 87 and regular, respirations  were 18, the weight was 162 pounds.  NECK:  No jugular venous distention.  LUNGS:  Clear bilaterally to auscultation.  No wheezes, rales, or  rhonchi are present.  CARDIOVASCULAR:  Regular rate and rhythm.  Normal S1, S2.  EXTREMITIES:  No edema.   MEDICATIONS:  1. Furosemide 40 twice a day.  2. Wellbutrin 300 a day.  3. Aspirin 81 a day.  4. Lopressor 50 daily.  5. Glipizide 5 twice daily.  6. Ambien p.r.n.  7. Seroquel p.r.n.   Interrogation of her pacemaker demonstrates a Medtronic InSync model  [id number].  The P-waves were greater than 2, the R-waves were greater than  11.  The impedance 585 in the A, 470 in the V.  The threshold was 0.5 at  0.4 in the atrium and 1 at 0.4 in the RV.  She was pacing 40% of the  time in the V, less than 1% in the A.  She had 17 mode switches less  than 0% at  that time.   IMPRESSION:  1. Nonischemic cardiomyopathy.  2. Congestive heart failure, presently class I.  3. Dyslipidemia.  4. Hypertension.   DISCUSSION:  Overall, the patient is stable, and her pacemaker is  working normally.  We will plan to see the patient back in the office in  1 year for pacemaker followup.  I will see her back as  needed prior to this.  Today, I have recommended that she take  omeprazole for her reflux symptoms daily.     Champ Mungo. Lovena Le, MD  Electronically Signed    GWT/MedQ  DD: 05/09/2008  DT: 05/10/2008  Job #: [id number]   cc:   Percell Miller L. Luan Pulling, M.D.

## 2011-01-27 NOTE — Discharge Summary (Signed)
[name], [name]              ACCOUNT NO.:  [id number]   MEDICAL RECORD NO.:  [id number]          PATIENT TYPE:  INP   LOCATION:  A212                          FACILITY:  [facility]   PHYSICIAN:  Edward L. Luan Pulling, M.D.DATE OF BIRTH:  May 03, 1951   DATE OF ADMISSION:  08/21/2007  DATE OF DISCHARGE:  LH                               DISCHARGE SUMMARY   .   DISCHARGE DIAGNOSES:  1. Bronchitis.  2. Hyponatremia.  3. Hypokalemia.  4. Acute renal failure.  5. Diabetes.  6. Neurogenic bladder.  7. History of pacemaker.  8. History of congestive heart failure.  9. History of back surgery.  10.History of cholecystectomy.  11.Bipolar disease.  12.Hyperlipidemia.   HISTORY:  [name] is a 60 year old who came to the emergency room  with bronchitis and fever.  Her temperature was 103.7.  She said that it  started about 10 days ago. She had fever, chills, aching all over.  She  had been coughing.  She had taken a Z-Pak, which did not help.  When she  came to the emergency room she was markedly hyponatremic, she was  hypokalemic and her renal function was not normal either.  She says that  she has had improved CHF. That was diagnosed some time ago.  Since then,  she has been at home and been doing fairly well.   PHYSICAL EXAM:  On admission, showed a well-developed, well-nourished  female in no acute distress.  Her chest had some rhonchi, but fairly clear.  Her abdomen was soft.  Liver was not palpable.  Her heart was regular without gallop.   White count 9000, hemoglobin 11.4, sodium was 123, potassium was 2.2,  carbon dioxide 23, glucose 256, BUN 42, creatinine 3.63.   Echocardiogram showed ejection fraction of about 45%.  This is an  improvement from before.   HOSPITAL COURSE:  She was given IV fluids.  Her diuretics were held and  she showed marked improvement.  Her potassium was replaced.  She is  discharged home in much improved condition off of metformin because of  problems with her renal function.  She is going to take Glucotrol 5 mg  b.i.d. for her blood sugar. She is going to continue Wellbutrin XL 300  mg daily, Effexor 150 mg said to be b.i.d. Lasix I am only going to have  her take that on a p.r.n. basis. Spironolactone 25 mg on a p.r.n. basis.  The Lasix will be 40 mg daily or twice a day.  She is going to continue  Toprol XL 75 mg daily, Klonopin 0.5 mg t.i.d., aspirin 81 mg daily.  __________  50 mg four times a day.  She is on hydrocodone 7.5/500 every  6 hours as needed, Seroquel 600 mg at bedtime and Ambien 20 mg at  bedtime.  [facility].  I am not  going to send her home on any potassium right now, but she may need  potassium later. I am going to have her get a BMET next week.  She is  going to continue  Lipitor 80 mg at bedtime.      Edward L. Luan Pulling, M.D.  Electronically Signed     ELH/MEDQ  D:  08/24/2007  T:  08/24/2007  Job:  [id number]

## 2011-01-27 NOTE — H&P (Signed)
NAMEBLESSEN, CARVAJAL NO.:  [id number]   MEDICAL RECORD NO.:  [id number]          PATIENT TYPE:  INP   LOCATION:  2899                         FACILITY:  [facility]   PHYSICIAN:  Leeroy Cha, M.D.   DATE OF BIRTH:  18-Sep-1950   DATE OF ADMISSION:  02/22/2007  DATE OF DISCHARGE:                              HISTORY & PHYSICAL   HISTORY OF PRESENT ILLNESS:  Ms. Tafel is a lady who has been  complaining of back pain for at least 6 years, associated with pain down  to both lower extremities, which gives way with ambulation and better  with resting.  We know, in 2004 when I saw her, that she has stenosis at  the level of L4-5 with spondylolisthesis.  The patient underwent  discectomy and foraminotomy about 3 years ago, but she is not any  better, and now both legs are getting worse, right worse than the left  one.  The patient is being admitted for surgery.   PAST MEDICAL HISTORY:  Lumbar discectomy.  She has had a lump removed  from the breast.  She is allergic to no medications.   She drinks, does not smoke.   FAMILY HISTORY:  Unremarkable.   REVIEW OF SYSTEMS:  Positive for diabetes, shortness of breath, high  cholesterol, high blood pressure.   PHYSICAL EXAMINATION:  HEENT:  Head, nose, and throat normal.  NECK:  Normal.  LUNGS:  Clear.  HEART:  Heart sounds normal.  ABDOMEN:  Normal.  EXTREMITIES:  Normal pulses.  NEURO:  She has a weakness on dorsiflexion with both legs, straight leg  raising, SLR, is positive at 45 degrees.  She has a decrease of  flexibility of the lumbar spine.  Lumbar myelogram shows severe stenosis  at L4-5 with a grade 1 spondylolisthesis.   CLINICAL IMPRESSION:  L4-5 spondylolisthesis with stenosis.   RECOMMENDATIONS:  The patient is being admitted for surgery.  The  procedure would be bilateral 4-5 discectomy interbody fusion with  pedicle screws.  She knows all the risks,, such as, infection, CSF leak,  damage to the  vessels of the abdomen, no improvement whatsoever, need  for further surgery.           ______________________________  Leeroy Cha, M.D.     EB/MEDQ  D:  02/22/2007  T:  02/22/2007  Job:  [id number]

## 2011-01-27 NOTE — Discharge Summary (Signed)
[name], [name] NO.:  [id number]   MEDICAL RECORD NO.:  [id number]          PATIENT TYPE:  INP   LOCATION:  3009                         FACILITY:  [facility]   PHYSICIAN:  Leeroy Cha, M.D.   DATE OF BIRTH:  1951/06/23   DATE OF ADMISSION:  02/22/2007  DATE OF DISCHARGE:  03/01/2007                               DISCHARGE SUMMARY   ADMISSION DIAGNOSES:  1. Degenerative disk disease L4/L5 with spondylolisthesis.  2. Chronic radiculopathy.  3. Status post previous surgery.   FINAL DIAGNOSES:  1. Degenerative disk disease L4/L5 with spondylolisthesis.  2. Chronic radiculopathy.  3. Status post previous surgery.  4. Urinary retention.[name]   HISTORY OF PRESENT ILLNESS:  The patient was admitted because of back  pain in relation to both legs.  X-ray showed degenerative disk disease  with stenosis.  The patient previously had surgery.  Because of the  findings, the patient being admitted for surgery.   LABORATORIES:  Within normal limits.   COURSE IN THE HOSPITAL:  The patient was taken to surgery.  L4-L5  diskectomy with fusion was done.  The patient did well.  She had been  taking quite a bit of medication for her mental illness, and she was  quite sleepy soon after surgery.  We discontinued some medication, and  she is more awake now.  She had been having some problem with urinary  retention.  The patient was seen by the urologist yesterday, and he is  going to follow her in his office next week.  Today she is ambulating.  The wound looks fine.  She had no symptoms whatsoever except for  headache and for some lower back pain.   CONDITION ON DISCHARGE:  Improved.   MEDICATIONS:  Percocet for pain.  She is going to continue her previous  medication.   DIET:  She will continue with preadmission diet.   ACTIVITY:  Not to drive for at least a week.   FOLLOWUP:  Again to be seen by me in four weeks and the urologist next  week.    ______________________________  Leeroy Cha, M.D.    EB/MEDQ  D:  03/01/2007  T:  03/01/2007  Job:  [id number]

## 2011-01-27 NOTE — Assessment & Plan Note (Signed)
[place] OFFICE NOTE   [name], [name]                     MRN:          [id number]  DATE:03/30/2007                            DOB:          Apr 13, 1951    [name] returns today for followup.  She is a very pleasant middle-  aged woman with a nonischemic cardiomyopathy and congestive heart  failure, presently class II, with an EF of 35-40%.  She underwent  attempted BiV pacemaker implantation back in 2006, but her anatomy was  not suitable, and this could not be accomplished.  She returns today for  followup.  Overall, she has been stable.  She has recently undergone  back surgery but notes that she is now having problems with urination  since the back surgery.  She denies chest pain.  She has dyspnea with  exertion and has fatigue but nothing severe.  Her symptoms are class II.   PHYSICAL EXAMINATION:  GENERAL:  She is a pleasant well-appearing middle-  aged woman in no distress.  VITAL SIGNS:  Blood pressure 134/90, pulse 88 and regular, respirations  18.  Weight 152 pounds.  NECK:  No jugular venous distension.  LUNGS:  Clear bilaterally to auscultation.  No wheezing, rhonchi, or  rales were present.  CARDIOVASCULAR:  Regular rate and rhythm with normal S1 and S2.  EXTREMITIES:  No clubbing, cyanosis, or edema.  The pulses were 2+ and  symmetric.   MEDICATIONS:  1. Toprol-XL 50 one and a half tablets daily.  2. Furosemide 40 mg twice a day.  3. Glucophage.  4. Effexor.  5. Aspirin.  6. Seroquel.  7. Ambien.  8. Aldactone.  9. Prinivil which recently was stopped secondary to cough.   IMPRESSION:  1. Nonischemic cardiomyopathy.  2. Congestive heart failure, presently class II.  3. Status post implantable cardioverter-defibrillator.   DISCUSSION:  Overall, [name] is stable and her pacemaker is  working normally.  We will plan to see her back for followup in the  Pacemaker  Clinic in 1 year.     Champ Mungo. Lovena Le, MD  Electronically Signed   GWT/MedQ  DD: 03/30/2007  DT: 03/31/2007  Job #: [id number]

## 2011-01-27 NOTE — Group Therapy Note (Signed)
NAMEWAI, NAJAR              ACCOUNT NO.:  [id number]   MEDICAL RECORD NO.:  [id number]          PATIENT TYPE:  INP   LOCATION:  A212                          FACILITY:  [facility]   PHYSICIAN:  Edward L. Luan Pulling, M.D.DATE OF BIRTH:  1951/08/26   DATE OF PROCEDURE:  DATE OF DISCHARGE:                                 PROGRESS NOTE   Ms. Connelley seems to be feeling a little better.  She is not having any  new problems.   PHYSICAL EXAM:  Shows temperature is 99.8, pulse is 92, respirations 20,  blood pressure 89/46, O2 sats 94% on room air.  Her chest is clear.  Heart is regular.  ABDOMEN:  Soft.  Extremities showed no edema.   Her electrolytes this morning shows her potassium is up to 3, better  than it was.  BUN is 27, creatinine 2.19.  She is set for ultrasound of  the abdomen today.  She is going to continue with oral potassium  replacement.  She has also got potassium in her IV fluids and otherwise  she does look to be doing better.  I do not plan to change anything  today and I will check her after the testing done.      Edward L. Luan Pulling, M.D.  Electronically Signed     ELH/MEDQ  D:  08/23/2007  T:  08/23/2007  Job:  [id number]

## 2011-01-30 NOTE — Assessment & Plan Note (Signed)
[place] OFFICE NOTE   [name], [name]                     MRN:          [id number]  DATE:10/25/2006                            DOB:          [birth year]    [name] is seen today in followup.  She has a history of  nonischemic cardiomyopathy.   The last time I saw her we did not have records from her hospitalization  at [facility] in September.  I received these records and read  through 11 pages of them.   She was hospitalized for gall stone pancreatitis and congestive heart  failure.   The patient did have an echo at that time in the hospital with an EF of  25-30%.   Her last echo here in February of 2006 showed an EF of 35-45%.   In talking to the patient, she has been relatively stable.  She does  have occasional bloating in her abdomen and feels like she is retaining  fluid.  I told her she could take an extra Lasix for this if needed.  She has had some atypical chest pain.  She does not get it with  exertion.  She primarily gets them at night when she is about to go to  sleep.   As indicated, she had a normal cath in 2003.  Coronary risk factors  include hypertension, diabetes, and hyperlipidemia.   The patient also has a history of pacemaker implantation Valentine's day  in 2006.  Apparently, they could not place a left-sided lead.  I will  again have to go back through our records and see, but I suspect the  pacemaker was placed for congestive heart failure and RV and LV  synchrony.   The patient's review of systems is otherwise negative.  She has not had  any syncope or palpitations, PND, or orthopnea.   MEDICATIONS:  Numerous and include.  1. Toprol 75 mg a day.  2. Lasix 40 b.i.d.  3. Effexor 150 b.i.d.  4. Glucophage 500 b.i.d.  5. An aspirin a day.  6. Seroquel 200 a day.  7. Ambien 10 nightly.  8. Aldactone 25 a day.  9. Lipitor 80.  10.Prinvil 20.   EXAM:  Remarkable for being somewhat overweight.  The weight is stable,  however, at 159, blood pressure is 118/72, pulse is 76 and regular.  HEENT:  Normal.  There are no carotid bruits.  LUNGS:  Clear.  There is an S1, S2 with distant heart sounds.  ABDOMEN:  Benign.  Distal pulses are intact with no edema.  She has a slight peripheral  stocking-type neuropathy.   IMPRESSION:  Atypical chest pain in the setting of previously normal  catheterization in 2003.   I told the patient I do not think this was angina.  She will have a  followup Myoview in the fall.   In regards to her ejection fraction, it appears to have fluctuated some.  I suspect she continues to have moderate global hypokinesis.  She is on  good medication.  In the future, it may be worthwhile to switch her from  Toprol to Coreg given her diabetes.   The patient's volume status appears stable.  She is not having any PND  or orthopnea.  [facility] and I  told her this would benefit her skeletal muscles in terms of improving  her endurance.  I do not think she needs a BNP at this time.   Her current dose of aldactone and Lasix seem adequate.  She knows she  can take an extra Lasix should she have any swelling or increasing  shortness of breath.  She also knows to call us if she were to have a  significant 3 to 4 pound weight gain or increasing edema.   The patient will have her pacemaker checked in the pacemaker clinic some  time in the next 3 months.   Her hypertension is well controlled on her current medications.   Her last lipid profile was done July 22, 2006.  At that time, her LDL  was 87, which I think is adequate in light of the fact that she has no  known coronary artery disease.   I will see the patient back in about 6 months for her followup echo and  Myoview.     Wallis Bamberg. Johnsie Cancel, MD, [facility]  Electronically Signed    PCN/MedQ  DD: 10/25/2006  DT: 10/25/2006   Job #: [id number]

## 2011-01-30 NOTE — Discharge Summary (Signed)
[name], [name] NO.:  [id number]   MEDICAL RECORD NO.:  [id number]          PATIENT TYPE:  INP   LOCATION:  3707                         FACILITY:  [facility]   PHYSICIAN:  Champ Mungo. Lovena Le, M.D.  DATE OF BIRTH:  [birth year]   DATE OF ADMISSION:  10/28/2004  DATE OF DISCHARGE:  10/29/2004                                 DISCHARGE SUMMARY   DISCHARGE DIAGNOSES:  1.  Discharging postprocedure day one after implantation of Medtronic InSync      pacemaker.  Attempted placement of left ventricular lead aborted      secondary to inhospitable venous anatomy.  There was diaphragmatic      stimulation at one site, small vein at another site.  Unable to place      suitable left ventricular lead.  2.  Syncope.  3.  Left bundle branch block.  4.  Class II-III congestive heart failure.  5.  Nonischemic cardiomyopathy, ejection fraction 35-45%.   SECONDARY DIAGNOSES:  1.  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  2.  Chronic back pain.  3.  Diabetes mellitus type 2.   PROCEDURE:  On October 28, 2004, implantation of Medtronic InSync  pacemaker.  Unable to place left ventricular lead secondary to coronary  venous anatomy. Dr. Cristopher Peru, practitioner.   DISCHARGE DISPOSITION:  Ms. [name] is ready for discharge  postprocedure day one.  She has maintained sinus rhythm throughout her  hospitalization.  She does have some fusion beats noted on EKG.  Once again,  we were unable to place a left ventricular lead.  The patient does have  Medtronic pacemaker in place.  Her incision is healing nicely.  There is no  evidence of erythema, swelling, or drainage.  She has only mild discomfort  at the incision site.  Chest x-ray showed the leads were in appropriate  placement.  No pneumothorax.  Postprocedure interrogation shows that no  changes were made.  All values within normal limits.  The patient was  hypotensive, with systolic blood pressure of 90 on the  morning of discharge,  and her Prinivil was held.  Her Toprol was decreased to 50 mg instead of 75  mg.  She was given her morning Lasix.   She herself will go home with the following medications:  1.  Prinivil 20 mg daily.  2.  Toprol XL 50 mg 1-1/2 tab daily.  3.  Furosemide 40 mg daily.  4.  Effexor XR 150 mg b.i.d.  5.  Enteric coated aspirin 81 mg daily.  6.  Clonazepam 0.5 mg 1-3 tabs daily as needed.  7.  Seroquel 200 mg daily at bedtime.  8.  Ambien 10 mg two at bedtime.  9.  The patient is to hold her Glucophage 500 mg b.i.d. until Friday      October 31, 2004, then restart Friday.   The patient has been given a mobility sheet to describe left upper extremity  mobility.   DISCHARGE DIET:  Low sodium, low cholesterol diabetic diet.   She is asked to keep her incision dry for the next seven  days, sponge bathe  until Tuesday November 04, 2004.   FOLLOWUP:  At [facility], [address]  She presents to  the [facility] Clinic Wednesday November 12, 2004 at 10:00 in the morning.  She will  see Dr. Lovena Le in three months.  His office will call with that appointment.   BRIEF HISTORY:  [name] is a 60 year old female.  She has a history of  congestive heart failure which is class III in symptomatology.  She also has  nonischemic cardiomyopathy.  Ejection fraction varies between 35-45%.  She  was found to have a left bundle branch block and nonischemic cardiomyopathy.  She also recently had two episodes of syncope.  They were without warning.  They were short lived, and by the time she hit the floor she was awake  again.  She has had no palpitations with the spells.  She presents with  bruises on her arms and legs from her falls.  She has a history of  depression stemming from her son's premature demise five years ago in an  automobile accident.  She also has depression related to marital  difficulties related to her husband's alcoholism.  She would perhaps be a   candidate for ICD/biventricular implantation.  However, follow-up  echocardiogram shows ejection fraction greater than 35%.  She presents now  for biventricular pacer placement.   HOSPITAL COURSE:  The patient presented electively October 28, 2004 for  biventricular pacer placement by Dr. Lovena Le.  A Medtronic pacemaker was  implanted.  Left ventricular lead was not implanted secondary to difficult  coronary venous anatomy.  The patient tolerated the procedure well, however,  and is in sinus rhythm.  Discharging postprocedure day one.  She has once  again a slight hypotension, with systolic blood pressure 90.  Medications  were adjusted for this one day after discharge as mentioned above.      GM/MEDQ  D:  10/29/2004  T:  10/29/2004  Job:  [id number]   cc:   Champ Mungo. Lovena Le, M.D.   Edward L. Luan Pulling, M.D.  [place]  [place] [address]  Fax: [phone]

## 2011-01-30 NOTE — Op Note (Signed)
[name], [name]                        ACCOUNT NO.:  [id number]   MEDICAL RECORD NO.:  [id number]                   PATIENT TYPE:  OIB   LOCATION:  [id number]                                 FACILITY:  [facility]   PHYSICIAN:  Leeroy Cha, M.D.                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   PROCEDURE:  Bilateral 4-5 laminectomy, decompression of the L4-L5, S1 nerve  root, microscope.   SURGEON:  Leeroy Cha, M.D.   ASSISTANT:  Marchia Meiers. Vertell Limber, M.D.   INDICATIONS FOR PROCEDURE:  [name] is a 60 year old female complaining  of back pain with radiation to both legs. She has weakness on dorsiflexion.  The patient is having claudication lately. An MRI showed ________ stenosis  at the level of 4-5. Flexion and extension did not show any subluxation. The  patient wanted to proceed with surgery, and the risks were explained to her,  including  the possibility of further surgery, including  fusion.   DESCRIPTION OF PROCEDURE:  The patient was taken to the operating room and  after intubation, she was positioned in a prone manner. A midline incision  from L4 down to L5 was made. The muscles were retracted laterally. We  identified 5-1 and 4-5. We removed the spinal stenosis of L4-L5.   With the drill we drilled the laminas at those two levels. We brought the  microscope into the area, and using the 2, 3 and 4 Kerrison punch, bilateral  laminectomy with preservation of the facet was done. The yellow ligament  also was excised. We went laterally and we decompressed the L4-L5, S1 nerve  root.   At the end we had good  decompression. The level was proved by x-ray. Then  Valsalva maneuver was negative. Depo-Medrol was left in the epidural space  and the wound was closed with Vicryl and Steri-Strips.                                               Leeroy Cha, M.D.    EB/MEDQ  D:  10/03/2003  T:  10/03/2003  Job:  [id number]

## 2011-01-30 NOTE — Assessment & Plan Note (Signed)
[place] OFFICE NOTE   ADRIENNE, GISI                     MRN:          [id number]  DATE:07/22/2006                            DOB:          [birth year]    Ms. [name] was seen in the [facility] clinic today on the 8th of November,  2007 for followup of her Medtronic model [id number], In [place] III.  Date of  implant was October 28, 2004 for nonischemic cardiomyopathy.  On  interrogation of her device today, her battery voltage is 3.013.  P waves  measured greater than 2.8 mV with an atrial capture threshold of 0.5 volts  at 0.4 ms and an atrial lead impedence of 540 ohm.  R waves measured greater  than 11.2 mV with a ventricular pacing threshold of 0.5 volts at 0.4 ms and  a ventricular lead impedence of 459 ohm.  There were 34 mode-switch episodes  noted, totaling a 0% of time and 18 ventricular high rate episodes noted.  The longest one was 15 minutes long with a rate of 175 BPM.  No changes were  made in her parameters today.  She will be seen back again in six months  time.      Alma Friendly, LPN  Electronically Signed      Champ Mungo. Lovena Le, MD  Electronically Signed   PO/MedQ  DD: 07/22/2006  DT: 07/22/2006  Job #: [id number]

## 2011-01-30 NOTE — Procedures (Signed)
[name], [name]              ACCOUNT NO.:  [id number]   MEDICAL RECORD NO.:  [id number]          PATIENT TYPE:  OUT   LOCATION:  RAD                           FACILITY:  [facility]   PHYSICIAN:  Jacqulyn Ducking, M.D. [facility] OF BIRTH:  01-Dec-1950   DATE OF PROCEDURE:  05/22/2005  DATE OF DISCHARGE:                                  ECHOCARDIOGRAM   CLINICAL DATA:  A 60 year old woman with pacemaker.   RESULTS:  1.  Technically adequate echocardiographic study.  2.  Normal left atrium, right atrium and right ventricle.  Pacing wire      visualized traversing the right heart.  3.  Minimal aortic valvular sclerosis of a trileaflet structure.  4.  Mild mitral valve thickening; no prolapse; no regurgitation.  5.  Normal pulmonic valve and proximal pulmonary artery.  6.  Normal tricuspid valve; physiologic regurgitation; right ventricular      systolic pressure not ideally defined, appears normal.  7.  Normal left ventricular size; normal wall thickness; moderate to severe      hypokinesis of the anteroseptal region, at least partially due to the      presence of a paced rhythm.  Overall, LV systolic function is low normal      with an estimated ejection fraction of 0.50.  8.  Normal IVC.  9.  Normal Doppler examination.      Jacqulyn Ducking, M.D. [facility]  Electronically Signed     RR/MEDQ  D:  05/28/2005  T:  05/29/2005  Job:  [id number]

## 2011-01-30 NOTE — Assessment & Plan Note (Signed)
[place] OFFICE NOTE   [name], [name]                     MRN:          [id number]  DATE:08/02/2006                            DOB:          1951-06-23    [name] returns today for followup.  She has had a nonischemic  cardiomyopathy.  Her last EF that I see in the chart from February of 2006  was 35% to 40%.  She had a cath in 2003 with normal coronaries.   She apparently was hospitalized at [facility] recently for gallbladder  surgery.  She tells me that she had some heart problems at that time and  they did do an ultrasound.  We will have to get these records.  From our  perspective here, she has not had a significant PND or orthopnea.  She is  function class I.  Her weight has been stable.  She has been compliant with  her meds.   She is on a multitude of medications for her heart.  She is on Toprol 75 mg  a day, Lasix 40 mg b.i.d., an aspirin a day.   Lipitor 80 a day and Prinvil 20 a day.   Her last LDL cholesterol was excellent at 87.   Her biggest problem appears to be an alcoholic husband.   Unfortunately, their only child together died in a car accident 6 years ago.  Her husband's alcoholism is quite severe, and I suspect she will not be able  to stay with him.   EXAM:  The blood pressure is 100/60, pulse 68 and regular.  HEENT:  Normal.  There is no thyromegaly.  There is no lymphadenopathy.  Carotids are normal.  LUNGS:  Clear.  There is an S1 and S2.  PMI is increased.  JVP is normal.  There is no  murmurs, rubs, gallops, or clicks.  ABDOMEN:  Benign.  LOWER EXTREMITIES:  Intact pulses.  No edema.  NEURO:  Nonfocal.  SKIN:  Warm and dry.   IMPRESSION:  1. Stable nonischemic cardiomyopathy.  We will try to get the records from      [place].  If she has had an echocardiogram there, she does not need      her left ventricular function reassessed.  She will continue  her      current therapy, including diuretic angiotensin-converting enzyme      inhibitor, aspirin, and beta blocker.  2. She is a diabetic and, at some point, switching her Toprol to Coreg may      be in order for decreased insulin resistance.  3. I will see her back in about 3 months.  4. She will try to elicit the help of other family members and friends in      regards to her husband's alcoholism.     Wallis Bamberg. Johnsie Cancel, MD, [facility]  Electronically Signed    PCN/MedQ  DD: 08/02/2006  DT: 08/02/2006  Job #: [id number]

## 2011-01-30 NOTE — Procedures (Signed)
   NAME:  [name], [name]                        ACCOUNT NO.:  [id number]   MEDICAL RECORD NO.:  [id number]                   PATIENT TYPE:  OUT   LOCATION:  RAD                                  FACILITY:  [facility]   PHYSICIAN:  Jacqulyn Ducking, M.D. [facility]           DATE OF BIRTH:  07-22-1951   DATE OF PROCEDURE:  08/22/2002  DATE OF DISCHARGE:                                  ECHOCARDIOGRAM   CLINICAL DATA:  A 60 year old woman with diabetes, hypertension, and  evidence for congestive heart failure.   1. Technically difficult and somewhat limited echocardiographic study.  2. Normal left atrium, right atrium, and right ventricle.  3. Normal aorta, mitral, tricuspid, and pulmonic valves; mild to moderate     mitral annular calcification.  4. Unremarkable Doppler study with trivial mitral and tricuspid     regurgitation.  5. Normal tricuspid valve with a substantial elevation in estimated RV     systolic pressure.  6. Left ventricular size at the upper limit of normal; there is abnormal     septal motion-the anteroseptal region is virtually akinetic. The apex is     also significantly hypokinetic. Overall function appears to be moderately     impaired with an estimated ejection fraction of 0.35.  7. The IVC is not well seen - it appears normal.                                               Jacqulyn Ducking, M.D. [facility]    RR/MEDQ  D:  08/23/2002  T:  08/23/2002  Job:  [id number]

## 2011-01-30 NOTE — Cardiovascular Report (Signed)
NAMEBROOK, ZEINER                        ACCOUNT NO.:  [id number]   MEDICAL RECORD NO.:  [id number]                   PATIENT TYPE:  OIB   LOCATION:  2899                                 FACILITY:  [facility]   PHYSICIAN:  Ethelle Lyon, M.D. [facility]         DATE OF BIRTH:  03-Jan-1951   DATE OF PROCEDURE:  09/12/2002  DATE OF DISCHARGE:  09/12/2002                              CARDIAC CATHETERIZATION   PROCEDURES:  Left heart catheterization, left ventriculography, coronary  angiography.   INDICATIONS:  The patient is a 60 year old lady without prior history of  cardiac disease.  She presents with worsening bipedal edema and exertional  dyspnea.  Echocardiogram demonstrated EF of 35% with diffuse hyperkinesis.  She is therefore referred for diagnostic angiography and possible coronary  intervention.   DIAGNOSTIC TECHNIQUE:  Informed consent was obtained. Under 1% lidocaine  local anesthesia, a 6 French sheath was placed in the right femoral artery  using the modified Seldinger technique. Diagnostic angiography was performed  using a JL4 and No Torque right catheters.  Ventriculography was performed  using a 6 French straight pigtail catheter.  The patient tolerated the  procedure well and was transferred to the holding room in stable condition  where the sheaths will be removed.   COMPLICATIONS:  None.   FINDINGS:  1. LV:  137/15/28, EF 45% without regional wall motion abnormality.  2. Trace mitral regurgitation.  3. No aortic stenosis.  4. Left main:  Angiographically normal.  5. LAD:  The LAD is a moderate sized vessel giving rise to two diagnosis     branches.  It is angiographically normal.  6. Circumflex:  The circumflex is a moderate sized vessel giving rise to two     obtuse marginal branches.  It is angiographically normal.  7. RCA:  The RCA is a moderate sized, dominant vessel.  It is     angiographically normal.   IMPRESSION:  1. Angiographically normal  coronary arteries.  2. Ejection fraction 45% without regional wall motion abnormality.  3. No aortic stenosis.  4. Trace mitral regurgitation.  5. Markedly elevated left ventricular end-diastolic pressure.    PLAN:  The patient has nonischemic cardiomyopathy.  LVEDP is markedly  elevated. She was recently begun on diuretics.  We will continue this and  initiate ACE inhibition.  She will follow up with Dr. Lattie Haw in  [place].                                                     Ethelle Lyon, M.D. [facility]    WED/MEDQ  D:  09/12/2002  T:  09/13/2002  Job:  [id number]   cc:   Percell Miller L. Luan Pulling, M.D.  [place]  [place] [address]  Fax: [phone]  Jacqulyn Ducking, M.D. [facility]  [address] [address]  Fax: 1

## 2011-01-30 NOTE — H&P (Signed)
[name], [name]                        ACCOUNT NO.:  [id number]   MEDICAL RECORD NO.:  [id number]                   PATIENT TYPE:  OIB   LOCATION:  [id number]                                 FACILITY:  [facility]   PHYSICIAN:  Leeroy Cha, M.D.                DATE OF BIRTH:  16-Mar-1951   DATE OF ADMISSION:  10/03/2003  DATE OF DISCHARGE:                                HISTORY & PHYSICAL   [name] is a lady who was seen by me initially in October, 2004 with a  history of back pain for almost two years.  The pain is going to both legs,  but she feels that the right is worse than the left one.  Patient denies any  problem with bowel or bladder.  Patient had an MRI about three years ago  which showed some changes of the L4-5.  At this time, because she feels that  the pain is getting worse, she wants to proceed with surgery as soon as  possible.   PAST MEDICAL HISTORY:  She had a breast biopsy.   ALLERGIES:  She is not allergic to any medications.   FAMILY HISTORY:  Negative.   SOCIAL HISTORY:  Patient does not smoke.  She drinks socially.   REVIEW OF SYSTEMS:  Positive for anxiety, diabetes.  Difficulty with  concentration.  High cholesterol.  High blood pressure.   PHYSICAL EXAMINATION:  GENERAL:  A patient who came to my office, and she  was walking with a shorter stance.  HEENT:  Normal.  LUNGS:  Clear.  HEART:  Heart sounds are normal.  ABDOMEN:  Normal.  EXTREMITIES:  Normal pulses.  NEURO:  Mental status is normal.  Cranial nerves normal.  Strength:  She has  some weakness with dorsiflexion in both feet.  Straight-leg raising is  positive at about 60 degrees.  She has __________ of the lumbar spine.   The MRI showed that indeed she has a severe case of lumbar stenosis at the  level of L4-5.  There is no evidence of any subluxation with flexion and  extension.   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   RECOMMENDATIONS:  The patient is  being admitted for L4-5 laminectomy and  foraminotomy.  Patient was advised of the risks of infection, CSF leak,  worsening pain, damage to the vessels of the abdomen, need for further  surgery which might require fusion.                                               Leeroy Cha, M.D.   EB/MEDQ  D:  10/03/2003  T:  10/03/2003  Job:  [id number]

## 2011-01-30 NOTE — Op Note (Signed)
[name], ERVINE NO.:  [id number]   MEDICAL RECORD NO.:  [id number]          PATIENT TYPE:  INP   LOCATION:  2899                         FACILITY:  [facility]   PHYSICIAN:  Champ Mungo. Lovena Le, M.D.  DATE OF BIRTH:  06-Feb-1951   DATE OF PROCEDURE:  10/28/2004  DATE OF DISCHARGE:                                 OPERATIVE REPORT   PROCEDURE PERFORMED:  Implantation of a dual-chamber pacemaker with  attempted implantation of a biventricular pacemaker with, subsequent  insertion followed by dislodgement x2 of the left ventricular lead.   INTRODUCTION:  The patient is a 60 year old woman with a history of  nonischemic cardiomyopathy and an ejection fraction 35-40%.  She has class  III heart failure and left bundle branch block and a QRS duration of over  150 msec.  She is now referred for bi-V pacemaker insertion.   PROCEDURE:  After informed consent was obtained, the patient was taken to  the diagnostic EP lab in a fasting state.  After the usual preparation and draping, intravenous fentanyl and midazolam  was given for sedation.  Lidocaine 30 mL was infiltrated into the left  infraclavicular region.  A 7 cm incision was carried out over this region  and electrocautery utilized to dissect down to the fascial plane.  Ten  milliliters of contrast injected in the left upper extremity venous system  demonstrated a patent left subclavian vein.  It was subsequently punctured  x3.  The Medtronic model [id number], 58-cm active fixation pacing lead, serial  number [id number], was advanced to the RV septum.  Mapping was carried out  along the RV septum and at the final site, the R-waves measured 11 mV and  the ventricular threshold 0.9 V at 0.5 msec, with a pacing impedance of 851  ohms.  Ten-volt pacing did not stimulate the diaphragm after the lead was  actively fixed.  With the RV lead in satisfactory position, attention was  then turned to placement the atrial lead.  It was placed  in the right  atrium, where the P-waves measured 3 mV and the atrial threshold 1 V at 0.5  msec.  Ten-volt pacing did not stimulate the diaphragm with lead actively  fixed, and the pacing impedance was 800 ohms.  With both atrial and  ventricular leads and satisfactory position, attention was then turned  placement of the LV pacing lead.  The coronary sinus was cannulated without  difficulty.  Venography of the coronary sinus was then carried out  demonstrating three potential vein for LV pacing, first the middle cardiac  vein, which gave posterolateral branch.  The second and the most ideal was  the midlateral vein, which was bifurcated.  This vein went all the way out  to the LV apex; however, it was very small.  There was a high lateral vein  and even higher lateral vein noticed, but these were very small vessels, and  the pacing wire and the angioplasty wire could only be advanced several  centimeters into these particular veins.  The Medtronic bipolar as well as  Medtronic unipolar LV pacing leads were  subsequently advanced over the  angioplasty wire into the midlateral vein.  The lead could only be advanced  approximately one-fourth out from base to apex.  Twice this lead was allowed  to this spot and pacing thresholds were satisfactory.  However, each time  the lead subsequently dislodged spontaneously after the guiding catheters  were removed.  Interestingly enough, the posterolateral branches of the  middle cardiac vein were also attempted to utilize biventricular pacing, but  diaphragmatic stimulation and high pacing thresholds were noted in some sub-  branches of the middle cardiac vein.  For this reason the middle cardiac  vein nor its branches were suitable for LV pacing.  At this point, with no  suitable veins for LV pacing, the LV lead was removed and the atrial and RV  leads were secured to the subpectoralis fascia with a figure-of-eight silk  suture.  The sewing sleeve was  secured with silk suture.  Electrocautery was  utilized to assure hemostasis and to make a subcutaneous pocket.  Kanamycin  irrigation was utilized to irrigate the pocket.  Of course, the leads were  secured with silk suture, as was the sewing sleeve.  Additional kanamycin  was utilized to irrigate the pocket, and the Medtronic InSync model 8042  biventricular pacemaker, serial number [id number], was connected to the  right atrial and right ventricular pacing leads and placed in the  subcutaneous pocket.  The generator was secured with silk suture.  Additional kanamycin was utilized to irrigate the pocket and the incision  then closed with a layer of 2-0 Vicryl, followed by 3-0 Vicryl, followed by  a layer of 4-0 Vicryl.  Benzoin was painted on the skin and a pressure  dressing was placed and the patient returned to her room in satisfactory  condition.   COMPLICATIONS:  There were no immediate procedure complications.   RESULTS:  This demonstrates implantation of a Medtronic biventricular pacing  system with unsuccessful placement of the LV lead secondary to the patient's  very small venous anatomy with no stable pacing site secondary to either  inadequate-size veins or diaphragmatic stimulation, despite attempts  utilizing both the unipolar and bipolar leads.      GWT/MEDQ  D:  10/28/2004  T:  10/28/2004  Job:  [id number]   cc:   Scarlett Presto, M.D.  Fax: [place] Luan Pulling, M.D.  [place]  [place] [address]  Fax: [phone]

## 2011-02-04 ENCOUNTER — Ambulatory Visit (INDEPENDENT_AMBULATORY_CARE_PROVIDER_SITE_OTHER): Payer: Medicare Other | Admitting: Gastroenterology

## 2011-02-04 ENCOUNTER — Encounter: Payer: Self-pay | Admitting: Gastroenterology

## 2011-02-04 DIAGNOSIS — K5900 Constipation, unspecified: Secondary | ICD-10-CM

## 2011-02-04 DIAGNOSIS — K5909 Other constipation: Secondary | ICD-10-CM

## 2011-02-04 DIAGNOSIS — Z1211 Encounter for screening for malignant neoplasm of colon: Secondary | ICD-10-CM

## 2011-02-04 MED ORDER — LUBIPROSTONE 24 MCG PO CAPS: 24.0000 ug | ORAL_CAPSULE | Freq: Two times a day (BID) | ORAL | Status: AC

## 2011-02-04 NOTE — Progress Notes (Signed)
Subjective:    Patient ID: [name], female    DOB: 15-Jun-1951, 60 y.o.   MRN: [id number]  PCP: [facility]  HPI With Amitiza and Ex-Lax she has a BM 2x/week. Drinking water. Eating fiber. cAN'T AFFORD aMITIZA REGULARLY DUE TO COST, $100/MO. She has Medicare Rx coverage.  Past Medical History  Diagnosis Date  . Chronic constipation   . Anxiety and depression   . Back pain, chronic   . DM (diabetes mellitus)   . Cardiomyopathy   . Pacemaker   . HTN (hypertension)     Heart controlled w/o assoc CHF  . Heart failure     Chronic combined  . Hypercholesteremia   . GERD (gastroesophageal reflux disease)   . Constipation - functional    Past Surgical History  Procedure Date  . Biv 2006    PM  . Cesarean section   . Back surgery 2005  . 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 Upper gastrointestinal endoscopy MAY 2011 w/ PROP    MILD GASTRITIS 2o ETOH   No Known Allergies  Current Outpatient Prescriptions  Medication Sig Dispense Refill  . aspirin 81 MG tablet Take 81 mg by mouth daily.        [name] buPROPion (WELLBUTRIN XL) 300 MG 24 hr tablet Take 300 mg by mouth daily.        . clonazePAM (KLONOPIN) 0.5 MG tablet Take 0.5 mg by mouth 2 (two) times daily as needed.        . digoxin (LANOXIN) 0.125 MG tablet Take 125 mcg by mouth daily.        . furosemide (LASIX) 40 MG tablet Take 40 mg by mouth 2 (two) times daily.        [name] glipiZIDE (GLUCOTROL) 5 MG tablet Take 5 mg by mouth 2 (two) times daily before a meal.        . HYDROcodone-acetaminophen (VICODIN) 5-500 MG per tablet Take 1 tablet by mouth every 6 (six) hours as needed.       . metFORMIN (GLUCOPHAGE) 500 MG tablet Take 500 mg by mouth 2 (two) times daily with a meal.        . omeprazole (PRILOSEC) 20 MG capsule Take 20 mg by mouth daily.        . polyethylene glycol (MIRALAX / GLYCOLAX) packet Take 17 g by mouth daily PRN      . potassium chloride SA (K-DUR,KLOR-CON) 20 MEQ tablet Take 20 mEq  by mouth 2 (two) times daily.        . pravastatin (PRAVACHOL) 40 MG tablet Take 40 mg by mouth daily.        [name] venlafaxine (EFFEXOR-XR) 150 MG 24 hr capsule Take 150 mg by mouth daily.        [name] zolpidem (AMBIEN) 10 MG tablet Take 10 mg by mouth at bedtime as needed.        . lubiprostone (AMITIZA) 24 MCG capsule Take 1 capsule (24 mcg total) by mouth 2 (two) times daily with a meal. WHEN SHE CAN AFFORD IT        Review of Systems     Objective:   Physical Exam  Vitals reviewed. Constitutional: She is oriented to person, place, and time. She appears well-developed and well-nourished. No distress.  HENT:  Head: Normocephalic and atraumatic.  Cardiovascular: Normal rate, regular rhythm and normal heart sounds.   Pulmonary/Chest: Effort normal and breath sounds normal.  Abdominal: Soft. Bowel sounds are  normal. She exhibits no distension. There is no tenderness.  Neurological: She is alert and oriented to person, place, and time.  Psychiatric:       FLAT AFFECT.          Assessment & Plan:

## 2011-02-04 NOTE — Progress Notes (Signed)
Reminder in epic to follow up in one year

## 2011-02-04 NOTE — Assessment & Plan Note (Signed)
iTCS 2011 2o to poor bowel prep/tortuous colon  TCS IN 2016

## 2011-02-04 NOTE — Progress Notes (Signed)
Cc to PCP 

## 2011-02-04 NOTE — Assessment & Plan Note (Signed)
Responds to meds but she can't afford to take the meds daily.  Samples #8 Amitiza 24 mg given and Rx for one year. OPV in 1 year.

## 2011-03-14 ENCOUNTER — Other Ambulatory Visit: Payer: Self-pay | Admitting: Internal Medicine

## 2011-03-30 ENCOUNTER — Telehealth: Payer: Self-pay

## 2011-03-30 NOTE — Telephone Encounter (Signed)
Patient came by office wanting samples of Amitiza 24 mg. Gave her 2 boxes of Amitiza 24mg 

## 2011-03-31 NOTE — Telephone Encounter (Signed)
noted 

## 2011-05-20 ENCOUNTER — Encounter: Payer: Self-pay | Admitting: *Deleted

## 2011-06-22 LAB — URINALYSIS, ROUTINE W REFLEX MICROSCOPIC
Bilirubin Urine: NEGATIVE
Nitrite: NEGATIVE
Specific Gravity, Urine: 1.02
pH: 5

## 2011-06-22 LAB — BASIC METABOLIC PANEL
BUN: 15
BUN: 27 — ABNORMAL HIGH
BUN: 32 — ABNORMAL HIGH
CO2: 20
CO2: 23
Calcium: 6.8 — ABNORMAL LOW
Calcium: 7.3 — ABNORMAL LOW
Calcium: 8.2 — ABNORMAL LOW
Chloride: 109
Creatinine, Ser: 1.63 — ABNORMAL HIGH
Creatinine, Ser: 2.19 — ABNORMAL HIGH
Creatinine, Ser: 2.91 — ABNORMAL HIGH
GFR calc Af Amer: 40 — ABNORMAL LOW
GFR calc non Af Amer: 19 — ABNORMAL LOW
Glucose, Bld: 125 — ABNORMAL HIGH
Glucose, Bld: 140 — ABNORMAL HIGH
Potassium: 2.5 — CL

## 2011-06-22 LAB — DIFFERENTIAL
Eosinophils Absolute: 0 — ABNORMAL LOW
Eosinophils Relative: 0
Lymphs Abs: 0.5 — ABNORMAL LOW
Monocytes Relative: 13 — ABNORMAL HIGH

## 2011-06-22 LAB — URINE MICROSCOPIC-ADD ON

## 2011-06-22 LAB — URINE CULTURE

## 2011-06-22 LAB — COMPREHENSIVE METABOLIC PANEL
ALT: 67 — ABNORMAL HIGH
AST: 155 — ABNORMAL HIGH
Calcium: 7.7 — ABNORMAL LOW
GFR calc Af Amer: 16 — ABNORMAL LOW
Potassium: 2.2 — CL
Total Protein: 7.3

## 2011-06-22 LAB — CBC
Hemoglobin: 11.4 — ABNORMAL LOW
RBC: 3.84 — ABNORMAL LOW

## 2011-07-01 LAB — URINALYSIS, ROUTINE W REFLEX MICROSCOPIC
Bilirubin Urine: NEGATIVE
Ketones, ur: NEGATIVE
Protein, ur: 30 — AB
Urobilinogen, UA: 0.2

## 2011-07-01 LAB — URINE MICROSCOPIC-ADD ON

## 2011-07-02 LAB — CBC
HCT: 38.3
Hemoglobin: 12.9
MCHC: 33.7
MCV: 92.9
Platelets: 312
RBC: 4.13
RDW: 12.4
WBC: 8.2

## 2011-07-02 LAB — BASIC METABOLIC PANEL
CO2: 29
Chloride: 96
GFR calc Af Amer: >60
Potassium: 3.3 — ABNORMAL LOW
Sodium: 137

## 2011-07-02 LAB — ABO/RH: ABO/RH(D): A POS

## 2011-07-21 ENCOUNTER — Encounter: Payer: Self-pay | Admitting: Internal Medicine

## 2011-07-21 ENCOUNTER — Ambulatory Visit (INDEPENDENT_AMBULATORY_CARE_PROVIDER_SITE_OTHER): Payer: Medicare Other | Admitting: Internal Medicine

## 2011-07-21 DIAGNOSIS — I428 Other cardiomyopathies: Secondary | ICD-10-CM

## 2011-07-21 DIAGNOSIS — I5042 Chronic combined systolic (congestive) and diastolic (congestive) heart failure: Secondary | ICD-10-CM

## 2011-07-21 DIAGNOSIS — Z950 Presence of cardiac pacemaker: Secondary | ICD-10-CM

## 2011-07-21 LAB — PACEMAKER DEVICE OBSERVATION
AL THRESHOLD: 0.5 V
ATRIAL PACING PM: 0
RV LEAD AMPLITUDE: 8 mv
RV LEAD IMPEDENCE PM: 492 Ohm
RV LEAD THRESHOLD: 1 V

## 2011-07-21 NOTE — Assessment & Plan Note (Signed)
Her symptoms are class I. She behaves as if her left ventricular function is improved. I plan to have her undergo 2-D echo. If her EF is normalized then I would decrease her medications specifically stopping digoxin. On the other hand if her left ventricular dysfunction has stabilized or worsened, then she would be a candidate for initiation of beta blocker therapy as well as an ARB or ACE inhibitor. For now she will continue with a low-sodium diet and regular daily exercise.
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
## 2011-07-21 NOTE — Assessment & Plan Note (Signed)
Her device is working normally. Battery longevity is satisfactory. We plan to recheck in several months.

## 2011-07-21 NOTE — Patient Instructions (Signed)
Your physician recommends that you schedule a follow-up appointment in: 6 months with [place] physician has requested that you have an echocardiogram. Echocardiography is a painless test that uses sound waves to create images of your heart. It provides your doctor with information about the size and shape of your heart and how well your heart's chambers and valves are working. This procedure takes approximately one hour. There are no restrictions for this procedure.

## 2011-07-22 ENCOUNTER — Ambulatory Visit (HOSPITAL_COMMUNITY)
Admission: RE | Admit: 2011-07-22 | Discharge: 2011-07-22 | Disposition: A | Discharge status: Home / Self Care | Payer: Medicare Other | Source: Ambulatory Visit | Attending: Internal Medicine | Admitting: Internal Medicine

## 2011-07-22 DIAGNOSIS — I428 Other cardiomyopathies: Secondary | ICD-10-CM

## 2011-07-22 DIAGNOSIS — I369 Nonrheumatic tricuspid valve disorder, unspecified: Secondary | ICD-10-CM

## 2011-07-22 DIAGNOSIS — I501 Left ventricular failure: Secondary | ICD-10-CM | POA: Insufficient documentation

## 2011-07-22 DIAGNOSIS — E7800 Pure hypercholesterolemia, unspecified: Secondary | ICD-10-CM | POA: Insufficient documentation

## 2011-07-22 DIAGNOSIS — I5042 Chronic combined systolic (congestive) and diastolic (congestive) heart failure: Secondary | ICD-10-CM

## 2011-07-22 NOTE — Progress Notes (Signed)
*  PRELIMINARY RESULTS* Echocardiogram 2D Echocardiogram has been performed.  [name] 07/22/2011, 2:22 PM

## 2011-07-24 ENCOUNTER — Other Ambulatory Visit: Payer: Self-pay

## 2011-07-24 MED ORDER — CARVEDILOL 3.125 MG PO TABS: 3.1250 mg | ORAL_TABLET | Freq: Two times a day (BID) | ORAL | Status: DC

## 2011-07-24 MED ORDER — LOSARTAN POTASSIUM 50 MG PO TABS: 50.0000 mg | ORAL_TABLET | Freq: Every day | ORAL | Status: DC

## 2011-08-24 ENCOUNTER — Ambulatory Visit (INDEPENDENT_AMBULATORY_CARE_PROVIDER_SITE_OTHER): Payer: Medicare Other | Admitting: Internal Medicine

## 2011-08-24 ENCOUNTER — Encounter: Payer: Self-pay | Admitting: Internal Medicine

## 2011-08-24 DIAGNOSIS — I5042 Chronic combined systolic (congestive) and diastolic (congestive) heart failure: Secondary | ICD-10-CM

## 2011-08-24 DIAGNOSIS — I119 Hypertensive heart disease without heart failure: Secondary | ICD-10-CM

## 2011-08-24 MED ORDER — CARVEDILOL 6.25 MG PO TABS: 6.2500 mg | ORAL_TABLET | Freq: Two times a day (BID) | ORAL | Status: DC

## 2011-08-24 NOTE — Patient Instructions (Signed)
Your physician recommends that you schedule a follow-up appointment in: 3 months  Your physician has recommended you make the following change in your medication: Increase Coreg to 6.25 mg twice daily

## 2011-08-24 NOTE — Assessment & Plan Note (Signed)
Her blood pressure is a bit elevated. Will increase her beta blocker.
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
## 2011-08-24 NOTE — Assessment & Plan Note (Addendum)
Her symptoms remain class 1 despite her severe LV dysfunction. She will have her carvedilol uptitrated.  With class 1 symptoms, she does not have an indication for an ICD.

## 2011-11-09 ENCOUNTER — Other Ambulatory Visit: Payer: Self-pay | Admitting: Obstetrics and Gynecology

## 2011-11-09 DIAGNOSIS — Z139 Encounter for screening, unspecified: Secondary | ICD-10-CM

## 2011-11-12 ENCOUNTER — Ambulatory Visit (HOSPITAL_COMMUNITY): Payer: Medicare Other

## 2011-11-16 ENCOUNTER — Other Ambulatory Visit: Payer: Self-pay

## 2011-11-16 MED ORDER — DIGOXIN 125 MCG PO TABS: 125.0000 ug | ORAL_TABLET | Freq: Every day | ORAL | Status: DC

## 2011-11-16 NOTE — Telephone Encounter (Signed)
.   Requested Prescriptions   Signed Prescriptions Disp Refills  . digoxin (LANOXIN) 0.125 MG tablet 30 tablet 6    Sig: Take 1 tablet (125 mcg total) by mouth daily.    Authorizing Provider: Cristopher Peru    Ordering User: Laurence Compton

## 2011-11-17 ENCOUNTER — Encounter: Payer: Self-pay | Admitting: Internal Medicine

## 2011-11-17 ENCOUNTER — Ambulatory Visit (INDEPENDENT_AMBULATORY_CARE_PROVIDER_SITE_OTHER): Payer: Medicare Other | Admitting: Internal Medicine

## 2011-11-17 ENCOUNTER — Telehealth: Payer: Self-pay | Admitting: Internal Medicine

## 2011-11-17 DIAGNOSIS — Z950 Presence of cardiac pacemaker: Secondary | ICD-10-CM

## 2011-11-17 DIAGNOSIS — I5042 Chronic combined systolic (congestive) and diastolic (congestive) heart failure: Secondary | ICD-10-CM

## 2011-11-17 DIAGNOSIS — I428 Other cardiomyopathies: Secondary | ICD-10-CM

## 2011-11-17 DIAGNOSIS — Z01810 Encounter for preprocedural cardiovascular examination: Secondary | ICD-10-CM | POA: Insufficient documentation

## 2011-11-17 LAB — PACEMAKER DEVICE OBSERVATION
AL IMPEDENCE PM: 585 Ohm
AL THRESHOLD: 0.5 V
ATRIAL PACING PM: 0
BATTERY VOLTAGE: 2.933 V
RV LEAD IMPEDENCE PM: 477 Ohm

## 2011-11-17 NOTE — Assessment & Plan Note (Signed)
Her device is working normally and she is not pacemaker dependent.
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
## 2011-11-17 NOTE — Telephone Encounter (Addendum)
Pt calling re surgery tomorrow on foot, saw dr Lovena Le today, pacemaker checked and pt was told her heart only working at 20%, pt very upset and requesting a call asap  [phone]

## 2011-11-17 NOTE — Telephone Encounter (Signed)
Dr Lovena Le spoke with patient

## 2011-11-17 NOTE — Patient Instructions (Signed)
Your physician recommends that you schedule a follow-up appointment in: 6 months for pacer check and OV with Dr Lovena Le

## 2011-11-17 NOTE — Assessment & Plan Note (Signed)
The patient is scheduled for foot surgery tomorrow. She is low risk, despite her severe LV dysfunction. She has class 1 symptoms. She may need blood pressure support with induction of anesthesia.

## 2011-11-17 NOTE — Assessment & Plan Note (Signed)
She is class 1. She will continue her current meds. She is anxious to return to exercise.

## 2011-11-25 ENCOUNTER — Other Ambulatory Visit: Payer: Self-pay | Admitting: Obstetrics and Gynecology

## 2011-11-25 DIAGNOSIS — Z139 Encounter for screening, unspecified: Secondary | ICD-10-CM

## 2011-11-27 ENCOUNTER — Other Ambulatory Visit: Payer: Self-pay

## 2011-11-27 MED ORDER — LOSARTAN POTASSIUM 50 MG PO TABS: 50.0000 mg | ORAL_TABLET | Freq: Every day | ORAL | Status: DC

## 2011-12-02 ENCOUNTER — Other Ambulatory Visit (HOSPITAL_COMMUNITY)
Admission: RE | Admit: 2011-12-02 | Discharge: 2011-12-02 | Disposition: A | Discharge status: Home / Self Care | Payer: Medicare Other | Source: Ambulatory Visit | Attending: Obstetrics and Gynecology | Admitting: Obstetrics and Gynecology

## 2011-12-02 ENCOUNTER — Other Ambulatory Visit: Payer: Self-pay | Admitting: Adult Health

## 2011-12-02 DIAGNOSIS — Z124 Encounter for screening for malignant neoplasm of cervix: Secondary | ICD-10-CM | POA: Insufficient documentation

## 2011-12-04 ENCOUNTER — Ambulatory Visit (HOSPITAL_COMMUNITY): Payer: Medicare Other

## 2011-12-17 ENCOUNTER — Ambulatory Visit (INDEPENDENT_AMBULATORY_CARE_PROVIDER_SITE_OTHER): Payer: Medicare Other | Admitting: Otolaryngology

## 2011-12-24 ENCOUNTER — Ambulatory Visit (INDEPENDENT_AMBULATORY_CARE_PROVIDER_SITE_OTHER): Payer: Medicare Other | Admitting: Otolaryngology

## 2012-05-17 ENCOUNTER — Ambulatory Visit (INDEPENDENT_AMBULATORY_CARE_PROVIDER_SITE_OTHER): Payer: Medicare Other | Admitting: Internal Medicine

## 2012-05-17 ENCOUNTER — Encounter: Payer: Self-pay | Admitting: Internal Medicine

## 2012-05-17 VITALS — BP 112/60 | HR 88 | Ht 62.0 in | Wt 151.8 lb

## 2012-05-17 DIAGNOSIS — I428 Other cardiomyopathies: Secondary | ICD-10-CM

## 2012-05-17 DIAGNOSIS — I5042 Chronic combined systolic (congestive) and diastolic (congestive) heart failure: Secondary | ICD-10-CM

## 2012-05-17 DIAGNOSIS — Z950 Presence of cardiac pacemaker: Secondary | ICD-10-CM

## 2012-05-17 DIAGNOSIS — Z01810 Encounter for preprocedural cardiovascular examination: Secondary | ICD-10-CM

## 2012-05-17 LAB — PACEMAKER DEVICE OBSERVATION
AL IMPEDENCE PM: 605 Ohm
AL THRESHOLD: 0.5 V
ATRIAL PACING PM: 0
BAMS-0001: 150 {beats}/min
RV LEAD AMPLITUDE: 8 mv
RV LEAD THRESHOLD: 1 V

## 2012-05-17 NOTE — Assessment & Plan Note (Signed)
Her symptoms are currently class 1-2. She will continue her current meds. A low sodium diet is requested.

## 2012-05-17 NOTE — Assessment & Plan Note (Signed)
I have discussed the issues regarding the decision to undergo surgery. I have asked the patient to talk to Dr. Stevie Kern as to whether she is likely to be able to return to her regular exercise routine (after surgery) which she is now not participating in because of pain. From the cardiac perspective her CHF is not limiting despite her LV dysfunction. I think she is an acceptable risk. I would recommend that the patient undergo surgery at [facility] hospital, that surgery duration be minimized and that an effort be made to avoid fluid shifts during surgery.

## 2012-05-17 NOTE — Assessment & Plan Note (Signed)
Her medtronic BiV PPM is working normally (except for the absence of an LV lead). She is pacing approx 35% of the time.

## 2012-05-17 NOTE — Patient Instructions (Addendum)
Your physician wants you to follow-up in: 12 months with Dr. Lovena Le.  You will receive a reminder letter in the mail two months in advance. If you don't receive a letter, please call our office to schedule the follow-up appointment.  Your physician wants you to follow-up in: 6 months in the device clinic with [facility]. You will receive a reminder letter in the mail two months in advance. If you don't receive a letter, please call our office to schedule the follow-up appointment.
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
## 2012-05-18 ENCOUNTER — Encounter: Payer: Self-pay | Admitting: Internal Medicine

## 2012-06-14 ENCOUNTER — Other Ambulatory Visit: Payer: Self-pay | Admitting: *Deleted

## 2012-06-14 HISTORY — PX: ACHILLES TENDON SURGERY: SHX542

## 2012-06-14 MED ORDER — DIGOXIN 125 MCG PO TABS: 125.0000 ug | ORAL_TABLET | Freq: Every day | ORAL | Status: DC

## 2012-06-15 ENCOUNTER — Encounter (HOSPITAL_COMMUNITY): Payer: Self-pay | Admitting: Pharmacy Technician

## 2012-06-15 ENCOUNTER — Other Ambulatory Visit: Payer: Self-pay | Admitting: Oncology

## 2012-06-15 NOTE — Progress Notes (Signed)
Dr. Beola Cord : When possible we need orders on [name] please -- surg 06/24/12 pt coming for preop 06/20/12 thank you

## 2012-06-15 NOTE — Progress Notes (Signed)
x

## 2012-06-16 ENCOUNTER — Other Ambulatory Visit: Payer: Self-pay | Admitting: Oncology

## 2012-06-20 ENCOUNTER — Ambulatory Visit (HOSPITAL_COMMUNITY)
Admission: RE | Admit: 2012-06-20 | Discharge: 2012-06-20 | Disposition: A | Discharge status: Home / Self Care | Payer: Medicare Other | Source: Ambulatory Visit | Attending: Orthopedic Surgery | Admitting: Orthopedic Surgery

## 2012-06-20 ENCOUNTER — Encounter (HOSPITAL_COMMUNITY)
Admission: RE | Admit: 2012-06-20 | Discharge: 2012-06-20 | Disposition: A | Discharge status: Home / Self Care | Payer: Medicare Other | Source: Ambulatory Visit | Attending: Orthopedic Surgery | Admitting: Orthopedic Surgery

## 2012-06-20 ENCOUNTER — Encounter (HOSPITAL_COMMUNITY): Payer: Self-pay

## 2012-06-20 DIAGNOSIS — Z01812 Encounter for preprocedural laboratory examination: Secondary | ICD-10-CM | POA: Insufficient documentation

## 2012-06-20 DIAGNOSIS — Z950 Presence of cardiac pacemaker: Secondary | ICD-10-CM | POA: Insufficient documentation

## 2012-06-20 DIAGNOSIS — M928 Other specified juvenile osteochondrosis: Secondary | ICD-10-CM | POA: Insufficient documentation

## 2012-06-20 DIAGNOSIS — I10 Essential (primary) hypertension: Secondary | ICD-10-CM | POA: Insufficient documentation

## 2012-06-20 DIAGNOSIS — Z01810 Encounter for preprocedural cardiovascular examination: Secondary | ICD-10-CM | POA: Insufficient documentation

## 2012-06-20 DIAGNOSIS — Z79899 Other long term (current) drug therapy: Secondary | ICD-10-CM | POA: Insufficient documentation

## 2012-06-20 DIAGNOSIS — E7800 Pure hypercholesterolemia, unspecified: Secondary | ICD-10-CM | POA: Insufficient documentation

## 2012-06-20 DIAGNOSIS — K219 Gastro-esophageal reflux disease without esophagitis: Secondary | ICD-10-CM | POA: Insufficient documentation

## 2012-06-20 DIAGNOSIS — M6240 Contracture of muscle, unspecified site: Secondary | ICD-10-CM | POA: Insufficient documentation

## 2012-06-20 DIAGNOSIS — I509 Heart failure, unspecified: Secondary | ICD-10-CM | POA: Insufficient documentation

## 2012-06-20 DIAGNOSIS — E119 Type 2 diabetes mellitus without complications: Secondary | ICD-10-CM | POA: Insufficient documentation

## 2012-06-20 DIAGNOSIS — M7660 Achilles tendinitis, unspecified leg: Secondary | ICD-10-CM | POA: Insufficient documentation

## 2012-06-20 HISTORY — DX: Depression, unspecified: F32.A

## 2012-06-20 HISTORY — DX: Major depressive disorder, single episode, unspecified: F32.9

## 2012-06-20 HISTORY — DX: Heart failure, unspecified: I50.9

## 2012-06-20 LAB — CBC
MCH: 31.1 pg (ref 26.0–34.0)
MCV: 92.8 fL (ref 78.0–100.0)
Platelets: 292 10*3/uL (ref 150–400)
RBC: 4.15 MIL/uL (ref 3.87–5.11)
RDW: 12.1 % (ref 11.5–15.5)
WBC: 5.5 10*3/uL (ref 4.0–10.5)

## 2012-06-20 LAB — BASIC METABOLIC PANEL
Calcium: 9.3 mg/dL (ref 8.4–10.5)
Creatinine, Ser: 0.76 mg/dL (ref 0.50–1.10)
GFR calc Af Amer: >90 mL/min (ref >90)
GFR calc non Af Amer: >90 mL/min (ref >90)
Sodium: 137 mEq/L (ref 135–145)

## 2012-06-20 LAB — SURGICAL PCR SCREEN: MRSA, PCR: NEGATIVE

## 2012-06-20 NOTE — Progress Notes (Signed)
Abnormal glucose results faxed via EPIC to Dr Beola Cord.

## 2012-06-20 NOTE — Progress Notes (Signed)
Faxed to [facility] device orders to be completed.

## 2012-06-20 NOTE — Progress Notes (Signed)
05/17/12 Last office visit note with Dr Cristopher Peru in [facility]  07/22/11 ECHO in [facility]  Last Device Interrogation 05/17/12 in [facility]

## 2012-06-20 NOTE — Patient Instructions (Signed)
[place]  06/20/2012   Your procedure is scheduled on:  [id number]  Report to [facility] at [place] AM.  Call this number if you have problems the morning of surgery: [phone]   Remember:   Do not eat food:After Midnight.  May have clear liquids:until Midnight .   Take these medicines the morning of surgery with A SIP OF WATER:    Do not wear jewelry, make-up or nail polish.  Do not wear lotions, powders, or perfumes.   Do not shave 48 hours prior to surgery.   Do not bring valuables to the hospital.  Contacts, dentures or bridgework may not be worn into surgery.  Leave suitcase in the car. After surgery it may be brought to your room.  For patients admitted to the hospital, checkout time is 11:00 AM the day of discharge.       SEE CHG INSTRUCTION SHEET    Please read over the following fact sheets that you were given: MRSA Information, coughing and deep breathing exercises, leg exercises

## 2012-06-21 NOTE — Progress Notes (Signed)
Received device perioperative programming orders and placed on front of chart.

## 2012-06-23 MED ORDER — CEFAZOLIN SODIUM-DEXTROSE 2-3 GM-% IV SOLR
2.0000 g | INTRAVENOUS | Status: AC
  Administered 2012-06-24: 2 g via INTRAVENOUS

## 2012-06-23 MED ORDER — CHLORHEXIDINE GLUCONATE 4 % EX LIQD
60.0000 mL | Freq: Once | CUTANEOUS | Status: DC
  Filled 2012-06-23: qty 60

## 2012-06-23 MED ORDER — SODIUM CHLORIDE 0.9 % IV SOLN: INTRAVENOUS | Status: DC

## 2012-06-23 NOTE — Progress Notes (Signed)
Received Cardiac Device Peroperative Device orders completed but not signed by MD after 3rd request and placed on chart.

## 2012-06-23 NOTE — Progress Notes (Signed)
3rd request for signed by MD Cardiac Device perioperative device programming orders to be faxed. Confirmation received.

## 2012-06-24 ENCOUNTER — Encounter (HOSPITAL_COMMUNITY): Payer: Self-pay | Admitting: *Deleted

## 2012-06-24 ENCOUNTER — Encounter (HOSPITAL_COMMUNITY): Payer: Self-pay | Admitting: Anesthesiology

## 2012-06-24 ENCOUNTER — Ambulatory Visit (HOSPITAL_COMMUNITY)
Admission: RE | Admit: 2012-06-24 | Discharge: 2012-06-24 | Disposition: A | Discharge status: Hospice | Payer: Medicare Other | Source: Ambulatory Visit | Attending: Orthopedic Surgery | Admitting: Orthopedic Surgery

## 2012-06-24 ENCOUNTER — Encounter (HOSPITAL_COMMUNITY)
Admission: RE | Disposition: A | Discharge status: Hospice | Payer: Self-pay | Source: Ambulatory Visit | Attending: Orthopedic Surgery

## 2012-06-24 ENCOUNTER — Ambulatory Visit (HOSPITAL_COMMUNITY): Payer: Medicare Other | Admitting: Anesthesiology

## 2012-06-24 DIAGNOSIS — M928 Other specified juvenile osteochondrosis: Secondary | ICD-10-CM | POA: Insufficient documentation

## 2012-06-24 DIAGNOSIS — M7660 Achilles tendinitis, unspecified leg: Secondary | ICD-10-CM

## 2012-06-24 DIAGNOSIS — I509 Heart failure, unspecified: Secondary | ICD-10-CM | POA: Insufficient documentation

## 2012-06-24 DIAGNOSIS — Z950 Presence of cardiac pacemaker: Secondary | ICD-10-CM | POA: Insufficient documentation

## 2012-06-24 DIAGNOSIS — M6240 Contracture of muscle, unspecified site: Secondary | ICD-10-CM | POA: Insufficient documentation

## 2012-06-24 DIAGNOSIS — E119 Type 2 diabetes mellitus without complications: Secondary | ICD-10-CM | POA: Insufficient documentation

## 2012-06-24 DIAGNOSIS — Z79899 Other long term (current) drug therapy: Secondary | ICD-10-CM | POA: Insufficient documentation

## 2012-06-24 DIAGNOSIS — I10 Essential (primary) hypertension: Secondary | ICD-10-CM | POA: Insufficient documentation

## 2012-06-24 DIAGNOSIS — S93499A Sprain of other ligament of unspecified ankle, initial encounter: Secondary | ICD-10-CM | POA: Insufficient documentation

## 2012-06-24 DIAGNOSIS — X58XXXA Exposure to other specified factors, initial encounter: Secondary | ICD-10-CM | POA: Insufficient documentation

## 2012-06-24 HISTORY — DX: Neuromuscular dysfunction of bladder, unspecified: N31.9

## 2012-06-24 HISTORY — PX: GASTROCNEMIUS RECESSION: SHX863

## 2012-06-24 LAB — GLUCOSE, CAPILLARY
Glucose-Capillary: 165 mg/dL — ABNORMAL HIGH (ref 70–99)
Glucose-Capillary: 151 mg/dL — ABNORMAL HIGH (ref 70–99)
Glucose-Capillary: 157 mg/dL — ABNORMAL HIGH (ref 70–99)

## 2012-06-24 SURGERY — EXCISION HAGLUND'S DEFORMITY WITH ACHILLES TENDON REPAIR
Anesthesia: General | Site: Foot | Laterality: Right | Wound class: Clean

## 2012-06-24 MED ORDER — CARVEDILOL 6.25 MG PO TABS
6.2500 mg | ORAL_TABLET | Freq: Two times a day (BID) | ORAL | Status: AC
  Administered 2012-06-24: 6.25 mg via ORAL
  Filled 2012-06-24: qty 1

## 2012-06-24 MED ORDER — PROMETHAZINE HCL 25 MG/ML IJ SOLN: 6.2500 mg | INTRAMUSCULAR | Status: DC | PRN

## 2012-06-24 MED ORDER — METHOCARBAMOL 500 MG PO TABS: 500.0000 mg | ORAL_TABLET | Freq: Three times a day (TID) | ORAL | Status: DC

## 2012-06-24 MED ORDER — MEPERIDINE HCL 50 MG/ML IJ SOLN: 6.2500 mg | INTRAMUSCULAR | Status: DC | PRN

## 2012-06-24 MED ORDER — PROPOFOL 10 MG/ML IV BOLUS
INTRAVENOUS | Status: DC | PRN
  Administered 2012-06-24: 150 mg via INTRAVENOUS

## 2012-06-24 MED ORDER — OXYCODONE-ACETAMINOPHEN 5-325 MG PO TABS: 1.0000 | ORAL_TABLET | ORAL | Status: DC | PRN

## 2012-06-24 MED ORDER — ONDANSETRON HCL 4 MG/2ML IJ SOLN
INTRAMUSCULAR | Status: DC | PRN
  Administered 2012-06-24: 4 mg via INTRAVENOUS

## 2012-06-24 MED ORDER — HYDROMORPHONE HCL PF 1 MG/ML IJ SOLN
0.2500 mg | INTRAMUSCULAR | Status: DC | PRN
  Administered 2012-06-24 (×4): 0.5 mg via INTRAVENOUS

## 2012-06-24 MED ORDER — MUPIROCIN 2 % EX OINT
TOPICAL_OINTMENT | CUTANEOUS | Status: AC
  Filled 2012-06-24: qty 22

## 2012-06-24 MED ORDER — ROCURONIUM BROMIDE 100 MG/10ML IV SOLN
INTRAVENOUS | Status: DC | PRN
  Administered 2012-06-24: 40 mg via INTRAVENOUS

## 2012-06-24 MED ORDER — VITAMIN C 500 MG PO TABS: 500.0000 mg | ORAL_TABLET | Freq: Every day | ORAL | Status: DC

## 2012-06-24 MED ORDER — LACTATED RINGERS IV SOLN: INTRAVENOUS | Status: DC

## 2012-06-24 MED ORDER — LIDOCAINE HCL (CARDIAC) 20 MG/ML IV SOLN
INTRAVENOUS | Status: DC | PRN
  Administered 2012-06-24: 50 mg via INTRAVENOUS

## 2012-06-24 MED ORDER — MIDAZOLAM HCL 5 MG/5ML IJ SOLN
INTRAMUSCULAR | Status: DC | PRN
  Administered 2012-06-24 (×2): 1 mg via INTRAVENOUS

## 2012-06-24 MED ORDER — ASPIRIN EC 325 MG PO TBEC: 325.0000 mg | DELAYED_RELEASE_TABLET | Freq: Two times a day (BID) | ORAL | Status: DC

## 2012-06-24 MED ORDER — ACETAMINOPHEN 10 MG/ML IV SOLN
INTRAVENOUS | Status: DC | PRN
  Administered 2012-06-24: 1000 mg via INTRAVENOUS

## 2012-06-24 MED ORDER — FENTANYL CITRATE 0.05 MG/ML IJ SOLN
50.0000 ug | INTRAMUSCULAR | Status: DC | PRN
  Administered 2012-06-24: 50 ug via INTRAVENOUS

## 2012-06-24 MED ORDER — MUPIROCIN 2 % EX OINT
TOPICAL_OINTMENT | Freq: Two times a day (BID) | CUTANEOUS | Status: DC
Start: 2012-06-24 — End: 2012-06-24
  Administered 2012-06-24: 1 via NASAL

## 2012-06-24 MED ORDER — OXYCODONE-ACETAMINOPHEN 5-325 MG PO TABS
1.0000 | ORAL_TABLET | ORAL | Status: DC | PRN
  Administered 2012-06-24: 2 via ORAL
  Filled 2012-06-24: qty 2

## 2012-06-24 MED ORDER — FENTANYL CITRATE 0.05 MG/ML IJ SOLN
INTRAMUSCULAR | Status: DC | PRN
  Administered 2012-06-24: 50 ug via INTRAVENOUS
  Administered 2012-06-24: 100 ug via INTRAVENOUS
  Administered 2012-06-24 (×2): 50 ug via INTRAVENOUS

## 2012-06-24 MED ORDER — ROPIVACAINE HCL 5 MG/ML IJ SOLN
INTRAMUSCULAR | Status: DC | PRN
  Administered 2012-06-24: 30 mL

## 2012-06-24 MED ORDER — 0.9 % SODIUM CHLORIDE (POUR BTL) OPTIME
TOPICAL | Status: DC | PRN
  Administered 2012-06-24: 1000 mL

## 2012-06-24 MED ORDER — LACTATED RINGERS IV SOLN
INTRAVENOUS | Status: DC | PRN
  Administered 2012-06-24 (×2): via INTRAVENOUS

## 2012-06-24 SURGICAL SUPPLY — 45 items
APL SKNCLS STERI-STRIP NONHPOA (GAUZE/BANDAGES/DRESSINGS) ×1
BAG SPEC THK2 15X12 ZIP CLS (MISCELLANEOUS) ×1
BAG ZIPLOCK 12X15 (MISCELLANEOUS) ×2 IMPLANT
BANDAGE ELASTIC 4 VELCRO ST LF (GAUZE/BANDAGES/DRESSINGS) ×1 IMPLANT
BENZOIN TINCTURE PRP APPL 2/3 (GAUZE/BANDAGES/DRESSINGS) ×2 IMPLANT
CLOTH BEACON ORANGE TIMEOUT ST (SAFETY) ×2 IMPLANT
CUFF TOURN SGL QUICK 34 (TOURNIQUET CUFF) ×2
CUFF TRNQT CYL 34X4X40X1 (TOURNIQUET CUFF) ×1 IMPLANT
DECANTER SPIKE VIAL GLASS SM (MISCELLANEOUS) ×2 IMPLANT
DRAPE LG THREE QUARTER DISP (DRAPES) ×2 IMPLANT
DRAPE SURG 17X11 SM STRL (DRAPES) ×12 IMPLANT
DRSG PAD ABDOMINAL 8X10 ST (GAUZE/BANDAGES/DRESSINGS) ×2 IMPLANT
ELECT REM PT RETURN 9FT ADLT (ELECTROSURGICAL) ×2
ELECTRODE REM PT RTRN 9FT ADLT (ELECTROSURGICAL) ×1 IMPLANT
GAUZE XEROFORM 4X4 STRL (GAUZE/BANDAGES/DRESSINGS) ×2 IMPLANT
GAUZE XEROFORM 5X9 LF (GAUZE/BANDAGES/DRESSINGS) ×1 IMPLANT
GLOVE BIOGEL PI IND STRL 8 (GLOVE) ×1 IMPLANT
GLOVE BIOGEL PI INDICATOR 8 (GLOVE) ×1
GLOVE ECLIPSE 8.0 STRL XLNG CF (GLOVE) ×2 IMPLANT
GLOVE SURG SS PI 8.0 STRL IVOR (GLOVE) ×2 IMPLANT
GOWN STRL REIN XL XLG (GOWN DISPOSABLE) ×2 IMPLANT
KIT BASIN OR (CUSTOM PROCEDURE TRAY) ×2 IMPLANT
MANIFOLD NEPTUNE II (INSTRUMENTS) ×2 IMPLANT
NEEDLE HYPO 22GX1.5 SAFETY (NEEDLE) ×2 IMPLANT
NS IRRIG 1000ML POUR BTL (IV SOLUTION) ×2 IMPLANT
PACK ACHILLES SUTUREBRIDGE (Anchor) ×1 IMPLANT
PACK LOWER EXTREMITY WL (CUSTOM PROCEDURE TRAY) ×2 IMPLANT
PAD CAST 4YDX4 CTTN HI CHSV (CAST SUPPLIES) IMPLANT
PADDING CAST COTTON 4X4 STRL (CAST SUPPLIES) ×2
POSITIONER SURGICAL ARM (MISCELLANEOUS) ×2 IMPLANT
SOL PREP POV-IOD 16OZ 10% (MISCELLANEOUS) ×2 IMPLANT
SOL PREP PROV IODINE SCRUB 4OZ (MISCELLANEOUS) ×2 IMPLANT
SPLINT PLASTER CAST XFAST 5X30 (CAST SUPPLIES) IMPLANT
SPLINT PLASTER XFAST SET 5X30 (CAST SUPPLIES) ×1
SPONGE GAUZE 4X4 12PLY (GAUZE/BANDAGES/DRESSINGS) ×2 IMPLANT
STRIP CLOSURE SKIN 1/2X4 (GAUZE/BANDAGES/DRESSINGS) ×6 IMPLANT
SUT FIBERWIRE 2-0 18 17.9 3/8 (SUTURE) ×2
SUT MNCRL AB 4-0 PS2 18 (SUTURE) ×3 IMPLANT
SUT NYLON 3 0 (SUTURE) ×1 IMPLANT
SUT VIC AB 3-0 X1 27 (SUTURE) ×2 IMPLANT
SUT VICRYL 3 0 (SUTURE) ×1 IMPLANT
SUTURE FIBERWR 2-0 18 17.9 3/8 (SUTURE) IMPLANT
SYR CONTROL 10ML LL (SYRINGE) ×2 IMPLANT
TOWEL OR 17X26 10 PK STRL BLUE (TOWEL DISPOSABLE) ×2 IMPLANT
WATER STERILE IRR 1500ML POUR (IV SOLUTION) ×2 IMPLANT

## 2012-06-24 NOTE — Transfer of Care (Signed)
Immediate Anesthesia Transfer of Care Note  Patient: [name]  Procedure(s) Performed: Procedure(s) (LRB) with comments: EXCISION HAGLUND'S DEFORMITY WITH ACHILLES TENDON REPAIR (Right) - Right Primary Repair Achilles Tendon Gastroc Slide Excision Haglund's Deformity GASTROCNEMIUS SLIDE (Right)  Patient Location: PACU  Anesthesia Type: General and Regional  Level of Consciousness: awake and alert   Airway & Oxygen Therapy: Patient Spontanous Breathing and Patient connected to face mask oxygen  Post-op Assessment: Report given to PACU RN and Post -op Vital signs reviewed and stable  Post vital signs: Reviewed and stable  Complications: No apparent anesthesia complications

## 2012-06-24 NOTE — Anesthesia Procedure Notes (Signed)
Anesthesia Regional Block:  Popliteal block  Pre-Anesthetic Checklist: ,, timeout performed, Correct Patient, Correct Site, Correct Laterality, Correct Procedure, Correct Position, site marked, Risks and benefits discussed,  Surgical consent,  Pre-op evaluation,  At surgeon's request and post-op pain management  Laterality: Right  Prep: chloraprep       Needles:  Injection technique: Single-shot  Needle Type: Stimiplex     Needle Length: 10cm 10 cm Needle Gauge: 20 and 20 G    Additional Needles:  Procedures: ultrasound guided and nerve stimulator Popliteal block Narrative:  Injection made incrementally with aspirations every 5 mL.  Performed by: Personally  Anesthesiologist: Montez Hageman MD  Additional Notes: Risks, benefits and alternative to block explained extensively.  Patient tolerated procedure well, without complications.  Popliteal block

## 2012-06-24 NOTE — Progress Notes (Signed)
Pt knows to go by a medical supply store to pick up a knee scooter

## 2012-06-24 NOTE — H&P (Signed)
  H&P documentation: Placed to be scanned history and physical exam in chart.  -History and Physical Reviewed  -Patient has been re-examined  -No change in the plan of care  [name] A  

## 2012-06-24 NOTE — Addendum Note (Signed)
Addendum  created 06/24/12 1138 by Montez Hageman, MD   Modules edited:Orders

## 2012-06-24 NOTE — Brief Op Note (Signed)
06/24/2012  10:06 AM  PATIENT:  [name]  61 y.o. female  PRE-OPERATIVE DIAGNOSIS:  right tight gastroc achilles tendontis and tear haglund's deformity  POST-OPERATIVE DIAGNOSIS:  right tight gastroc achelles tendonitis and tear haglunds  PROCEDURE:  Procedure(s) (LRB) with comments: EXCISION HAGLUND'S DEFORMITY WITH ACHILLES TENDON REPAIR (Right) - Right Primary Repair Achilles Tendon Gastroc Slide Excision Haglund's Deformity GASTROCNEMIUS SLIDE (Right)  SURGEON:  Surgeon(s) and Role:    * Colin Rhein, MD - Primary  PHYSICIAN ASSISTANT: Benita Stabile, PAC   ASSISTANTS: Benita Stabile, [facility]    ANESTHESIA:   general  EBL:  Total I/O In: 1000 [I.V.:1000] Out: -   BLOOD ADMINISTERED:none  DRAINS: none   LOCAL MEDICATIONS USED:  NONE  SPECIMEN:  No Specimen  DISPOSITION OF SPECIMEN:  N/A  COUNTS:  YES  TOURNIQUET:  * Missing tourniquet times found for documented tourniquets in log:  [id number] *  DICTATION: .Other Dictation: Dictation Number [id number]  PLAN OF CARE: Discharge to home after PACU  PATIENT DISPOSITION:  PACU - hemodynamically stable.   Delay start of Pharmacological VTE agent (>24hrs) due to surgical blood loss or risk of bleeding: no

## 2012-06-24 NOTE — Op Note (Signed)
NAMETARNESHIA, HENRIKSEN NO.:  [id number]  MEDICAL RECORD NO.:  [id number]  LOCATION:  [facility]                         FACILITY:  [facility]  PHYSICIAN:  Weber Cooks, M.D.     DATE OF BIRTH:  09/28/1950  DATE OF PROCEDURE:  06/24/2012 DATE OF DISCHARGE:  06/24/2012                              OPERATIVE REPORT   PREOPERATIVE DIAGNOSES: 1. Right tight gastrocs. 2. Right Achilles tendon tear/tendinopathy. 3. Right Haglund's deformity.  POSTOPERATIVE DIAGNOSES: 1. Right tight gastrocs. 2. Right Achilles tendon tear/tendinopathy. 3. Right Haglund's deformity.  OPERATION: 1. Right gastroc slide. 2. Excision of right Haglund deformity. 3. Right primary repair of Achilles tendon.  ANESTHESIA:  General.  SURGEON:  Weber Cooks, MD  ASSISTANT:  Erskine Emery, PA.  IMPLANT:  Arthrex suture bridge.  DISPOSITION:  Stable to PR.  INDICATION:  This is a 61 year old female, who has persistent longstanding posterior right heel pain due to the above pathology that was interfering with her life to the point where she cannot do what she wants to do despite conservative management.  She was consented for the above procedure.  All risks, infection, vessel injury, Achilles tendon rupture, persistent pain, worsening pain, prolonged recovery, wound healing problems, DVT, PE were all explained.  Questions were encouraged and answered.  OPERATION:  The patient was brought to the operating room, placed in supine position initially after adequate general anesthesia was administered with popliteal block as well as Ancef 1 g IV piggyback. The patient was then placed in prone position.  All bony prominences were well padded.  Chest bolsters were placed.  Right lower extremity was then prepped and draped in sterile manner over proximally placed thigh tourniquet.  Limb was gravity exsanguinated and tourniquet was elevated to 290 mmHg.  A longitudinal incision to medial aspect of  the gastrocnemius musculotendinous junction was then made.  Dissection was carried down through skin.  Hemostasis was obtained.  Fascia was opened in line with the incision. Conjoined region was then developed between the gastroc soleus.  Soft tissue was elevated off the posterior aspect of the gastrocnemius.  Sural nerve was identified and protected posteriorly.  Gastrocnemius was then released with curved Mayo scissors. This had an extra release of tight gastroc.  The area was copiously irrigated with normal saline.  Subcu was closed with 3-0 Vicryl.  Skin was closed with 4-0 Monocryl subcuticular stitch.  Steri-Strips were applied.  A longitudinal incision along the anteromedial and anterolateral aspects of the Achilles tendon was then made.  Dissection was carried down directly to bone.  Soft tissue was elevated off the calcaneal tuber.  The retrocalcaneal bursa was removed and with a sagittal saw, Haglund's deformity was then resected.  Once this was resected, we then carefully dissected out the calcification within the Achilles tendon as well.  Once this was done, we then obtained a C-arm view to verify that this was completely removed.  Once this was done, we then placed drill holes from the Arthrex suture bridge medially and laterally within the calcaneal tuber from the freshly cut area and then with #2 FiberWire repair, the Achilles tendon down to bone.  We then crisscrossed the sutures through the  suture bridge technique and then with a knotless SwiveLock suture anchor through drill holes, we then placed the suture anchor with the suture in a crisscross technique. Once this was done, this held the Achilles tendon down beautifully.  We then repaired the edges with 2-0 FiberWire stitch on both the medial and lateral aspects of the Achilles tendon.  This had an Systems analyst. The area was copiously irrigated with normal saline.  Tourniquet was deflated.  Hemostasis was  obtained.  The skin was closed with 4-0 nylon stitch.  Sterile dressing was applied.  Modified Jones dressing was applied to the leg in gravity equinus.  The patient was stable to PR.     Weber Cooks, M.D.     PB/MEDQ  D:  06/24/2012  T:  06/24/2012  Job:  [id number]

## 2012-06-24 NOTE — Progress Notes (Signed)
Toes warm and color good. Toe movement encouraged

## 2012-06-24 NOTE — Anesthesia Preprocedure Evaluation (Addendum)
Anesthesia Evaluation  Patient identified by MRN, date of birth, ID band Patient awake    Reviewed: Allergy & Precautions, H&P , NPO status , Patient's Chart, lab work & pertinent test results  Airway Mallampati: II TM Distance: >3 FB Neck ROM: Full    Dental No notable dental hx.    Pulmonary neg pulmonary ROS,  breath sounds clear to auscultation  Pulmonary exam normal       Cardiovascular hypertension, Pt. on medications +CHF (nonischemic cardiomyopathy. EF 20%) negative cardio ROS  + pacemaker Rhythm:Regular Rate:Normal     Neuro/Psych negative neurological ROS  negative psych ROS   GI/Hepatic negative GI ROS, Neg liver ROS,   Endo/Other  negative endocrine ROSdiabetes, Oral Hypoglycemic Agents  Renal/GU negative Renal ROS  negative genitourinary   Musculoskeletal negative musculoskeletal ROS (+)   Abdominal   Peds negative pediatric ROS (+)  Hematology negative hematology ROS (+)   Anesthesia Other Findings   Reproductive/Obstetrics negative OB ROS                          Anesthesia Physical Anesthesia Plan  ASA: II  Anesthesia Plan: General   Post-op Pain Management:    Induction: Intravenous  Airway Management Planned: Oral ETT  Additional Equipment:   Intra-op Plan:   Post-operative Plan: Extubation in OR  Informed Consent: I have reviewed the patients History and Physical, chart, labs and discussed the procedure including the risks, benefits and alternatives for the proposed anesthesia with the patient or authorized representative who has indicated his/her understanding and acceptance.   Dental advisory given  Plan Discussed with: CRNA  Anesthesia Plan Comments: (Popliteal block for post op pain.)        Anesthesia Quick Evaluation

## 2012-06-24 NOTE — Anesthesia Postprocedure Evaluation (Signed)
  Anesthesia Post-op Note  Patient: [name]  Procedure(s) Performed: Procedure(s) (LRB): EXCISION HAGLUND'S DEFORMITY WITH ACHILLES TENDON REPAIR (Right) GASTROCNEMIUS SLIDE (Right)  Patient Location: PACU  Anesthesia Type: GA combined with regional for post-op pain  Level of Consciousness: awake and alert   Airway and Oxygen Therapy: Patient Spontanous Breathing  Post-op Pain: mild  Post-op Assessment: Post-op Vital signs reviewed, Patient's Cardiovascular Status Stable, Respiratory Function Stable, Patent Airway and No signs of Nausea or vomiting  Post-op Vital Signs: stable  Complications: No apparent anesthesia complications

## 2012-06-27 ENCOUNTER — Encounter (HOSPITAL_COMMUNITY): Payer: Self-pay | Admitting: Orthopedic Surgery

## 2012-06-29 MED FILL — Bupivacaine HCl Preservative Free (PF) Inj 0.5%: INTRAMUSCULAR | Qty: 30 | Status: AC

## 2012-07-22 ENCOUNTER — Ambulatory Visit (HOSPITAL_COMMUNITY)
Admission: RE | Admit: 2012-07-22 | Discharge: 2012-07-22 | Disposition: A | Discharge status: Home / Self Care | Payer: Medicare Other | Source: Ambulatory Visit | Attending: Obstetrics and Gynecology | Admitting: Obstetrics and Gynecology

## 2012-07-22 DIAGNOSIS — Z139 Encounter for screening, unspecified: Secondary | ICD-10-CM

## 2012-07-22 DIAGNOSIS — Z1231 Encounter for screening mammogram for malignant neoplasm of breast: Secondary | ICD-10-CM | POA: Insufficient documentation

## 2012-08-17 ENCOUNTER — Other Ambulatory Visit: Payer: Self-pay | Admitting: Cardiology

## 2012-08-17 DIAGNOSIS — I119 Hypertensive heart disease without heart failure: Secondary | ICD-10-CM

## 2012-08-17 DIAGNOSIS — I5042 Chronic combined systolic (congestive) and diastolic (congestive) heart failure: Secondary | ICD-10-CM

## 2012-08-17 MED ORDER — CARVEDILOL 6.25 MG PO TABS: 6.2500 mg | ORAL_TABLET | Freq: Two times a day (BID) | ORAL | Status: DC

## 2012-12-12 ENCOUNTER — Encounter: Payer: Self-pay | Admitting: Internal Medicine

## 2012-12-12 ENCOUNTER — Ambulatory Visit (INDEPENDENT_AMBULATORY_CARE_PROVIDER_SITE_OTHER): Payer: Medicare Other | Admitting: *Deleted

## 2012-12-12 ENCOUNTER — Other Ambulatory Visit: Payer: Self-pay | Admitting: Internal Medicine

## 2012-12-12 DIAGNOSIS — I428 Other cardiomyopathies: Secondary | ICD-10-CM

## 2012-12-12 DIAGNOSIS — I5042 Chronic combined systolic (congestive) and diastolic (congestive) heart failure: Secondary | ICD-10-CM

## 2012-12-12 LAB — PACEMAKER DEVICE OBSERVATION
AL AMPLITUDE: 2.8 mv
AL IMPEDENCE PM: 613 Ohm
AL THRESHOLD: 0.5 V
ATRIAL PACING PM: 0
BAMS-0001: 150 {beats}/min
BATTERY VOLTAGE: 2.891 V
RV LEAD AMPLITUDE: 8 mv
RV LEAD IMPEDENCE PM: 1067 Ohm
RV LEAD THRESHOLD: 1.5 V
VENTRICULAR PACING PM: 32

## 2012-12-12 NOTE — Progress Notes (Signed)
PPM check 

## 2013-06-12 ENCOUNTER — Ambulatory Visit (INDEPENDENT_AMBULATORY_CARE_PROVIDER_SITE_OTHER): Payer: Medicare Other | Admitting: Internal Medicine

## 2013-06-12 ENCOUNTER — Encounter: Payer: Self-pay | Admitting: Internal Medicine

## 2013-06-12 VITALS — BP 111/72 | HR 95 | Ht 62.0 in | Wt 143.0 lb

## 2013-06-12 DIAGNOSIS — I5042 Chronic combined systolic (congestive) and diastolic (congestive) heart failure: Secondary | ICD-10-CM

## 2013-06-12 DIAGNOSIS — I428 Other cardiomyopathies: Secondary | ICD-10-CM

## 2013-06-12 DIAGNOSIS — Z950 Presence of cardiac pacemaker: Secondary | ICD-10-CM

## 2013-06-12 LAB — PACEMAKER DEVICE OBSERVATION
AL IMPEDENCE PM: 691 Ohm
ATRIAL PACING PM: 0
RV LEAD IMPEDENCE PM: 1984 Ohm
RV LEAD THRESHOLD: 1 V
VENTRICULAR PACING PM: 36

## 2013-06-12 NOTE — Patient Instructions (Addendum)
Your physician recommends that you schedule a follow-up appointment in: [place] [address]

## 2013-06-12 NOTE — Assessment & Plan Note (Signed)
Her symptoms are currently class 1. She will continue her current medical therapy and maintain a low sodium diet.

## 2013-06-12 NOTE — Assessment & Plan Note (Signed)
Her device function is stable. Will plan to recheck in several months.
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
## 2013-06-15 ENCOUNTER — Other Ambulatory Visit: Payer: Self-pay

## 2013-06-15 DIAGNOSIS — I5042 Chronic combined systolic (congestive) and diastolic (congestive) heart failure: Secondary | ICD-10-CM

## 2013-06-15 DIAGNOSIS — I119 Hypertensive heart disease without heart failure: Secondary | ICD-10-CM

## 2013-06-15 MED ORDER — CARVEDILOL 6.25 MG PO TABS: 6.2500 mg | ORAL_TABLET | Freq: Two times a day (BID) | ORAL | Status: DC

## 2013-06-29 ENCOUNTER — Other Ambulatory Visit: Payer: Self-pay | Admitting: Obstetrics and Gynecology

## 2013-06-29 DIAGNOSIS — Z139 Encounter for screening, unspecified: Secondary | ICD-10-CM

## 2013-07-24 ENCOUNTER — Ambulatory Visit (HOSPITAL_COMMUNITY): Payer: Medicare Other

## 2013-07-25 ENCOUNTER — Ambulatory Visit (HOSPITAL_COMMUNITY)
Admission: RE | Admit: 2013-07-25 | Discharge: 2013-07-25 | Disposition: A | Discharge status: Home / Self Care | Payer: Medicare Other | Source: Ambulatory Visit | Attending: Obstetrics and Gynecology | Admitting: Obstetrics and Gynecology

## 2013-07-25 DIAGNOSIS — Z139 Encounter for screening, unspecified: Secondary | ICD-10-CM

## 2013-07-25 DIAGNOSIS — Z1231 Encounter for screening mammogram for malignant neoplasm of breast: Secondary | ICD-10-CM | POA: Insufficient documentation

## 2013-11-24 ENCOUNTER — Ambulatory Visit (INDEPENDENT_AMBULATORY_CARE_PROVIDER_SITE_OTHER): Payer: Medicare Other | Admitting: *Deleted

## 2013-11-24 ENCOUNTER — Encounter: Payer: Self-pay | Admitting: Internal Medicine

## 2013-11-24 DIAGNOSIS — F341 Dysthymic disorder: Secondary | ICD-10-CM

## 2013-11-24 DIAGNOSIS — I5042 Chronic combined systolic (congestive) and diastolic (congestive) heart failure: Secondary | ICD-10-CM

## 2013-11-24 DIAGNOSIS — Z950 Presence of cardiac pacemaker: Secondary | ICD-10-CM

## 2013-11-24 LAB — MDC_IDC_ENUM_SESS_TYPE_INCLINIC
Battery Remaining Longevity: 7 mo
Implantable Pulse Generator Model: 8042
Lead Channel Impedance Value: 2686 Ohm
Lead Channel Impedance Value: 712 Ohm
Lead Channel Pacing Threshold Amplitude: 1 V
Lead Channel Pacing Threshold Amplitude: 1.5 V
Lead Channel Pacing Threshold Pulse Width: 0.4 ms
Lead Channel Sensing Intrinsic Amplitude: 2.8 mV
Lead Channel Sensing Intrinsic Amplitude: 8 mV
Lead Channel Setting Pacing Amplitude: 2 V
Lead Channel Setting Sensing Sensitivity: 2.8 mV
MDC IDC MSMT LEADCHNL RV PACING THRESHOLD PULSEWIDTH: 0.4 ms
MDC IDC SET LEADCHNL RV PACING AMPLITUDE: 2.5 V
MDC IDC SET LEADCHNL RV PACING PULSEWIDTH: 0.4 ms

## 2013-11-24 NOTE — Addendum Note (Signed)
Addended by: Carlus Pavlov on: 11/24/2013 04:10 PM   Modules accepted: Orders

## 2013-11-24 NOTE — Progress Notes (Signed)
PPM check in office. 

## 2013-11-27 ENCOUNTER — Ambulatory Visit (HOSPITAL_COMMUNITY)
Admission: RE | Admit: 2013-11-27 | Discharge: 2013-11-27 | Disposition: A | Discharge status: Home / Self Care | Payer: Medicare Other | Source: Ambulatory Visit | Attending: Internal Medicine | Admitting: Internal Medicine

## 2013-11-27 DIAGNOSIS — F341 Dysthymic disorder: Secondary | ICD-10-CM

## 2013-11-27 DIAGNOSIS — Z0389 Encounter for observation for other suspected diseases and conditions ruled out: Secondary | ICD-10-CM | POA: Insufficient documentation

## 2013-11-27 DIAGNOSIS — Z950 Presence of cardiac pacemaker: Secondary | ICD-10-CM | POA: Insufficient documentation

## 2013-12-07 ENCOUNTER — Encounter: Payer: Self-pay | Admitting: *Deleted

## 2013-12-07 ENCOUNTER — Ambulatory Visit (INDEPENDENT_AMBULATORY_CARE_PROVIDER_SITE_OTHER): Payer: Medicare Other | Admitting: Internal Medicine

## 2013-12-07 ENCOUNTER — Encounter: Payer: Self-pay | Admitting: Internal Medicine

## 2013-12-07 VITALS — BP 127/76 | HR 89 | Ht 62.0 in | Wt 140.0 lb

## 2013-12-07 DIAGNOSIS — Z950 Presence of cardiac pacemaker: Secondary | ICD-10-CM

## 2013-12-07 DIAGNOSIS — I5042 Chronic combined systolic (congestive) and diastolic (congestive) heart failure: Secondary | ICD-10-CM

## 2013-12-07 DIAGNOSIS — T82110A Breakdown (mechanical) of cardiac electrode, initial encounter: Secondary | ICD-10-CM

## 2013-12-07 DIAGNOSIS — I428 Other cardiomyopathies: Secondary | ICD-10-CM

## 2013-12-07 LAB — MDC_IDC_ENUM_SESS_TYPE_INCLINIC
Battery Voltage: 2.82 V
Brady Statistic AP VP Percent: 0 %
Brady Statistic AS VS Percent: 52 %
Date Time Interrogation Session: 20150326110426
Implantable Pulse Generator Model: 8042
Lead Channel Impedance Value: 0 Ohm
Lead Channel Impedance Value: 2530 Ohm
Lead Channel Pacing Threshold Amplitude: 1 V
Lead Channel Pacing Threshold Pulse Width: 0.4 ms
Lead Channel Pacing Threshold Pulse Width: 0.5 ms
Lead Channel Sensing Intrinsic Amplitude: 2.8 mV
Lead Channel Sensing Intrinsic Amplitude: 8 mV
Lead Channel Setting Pacing Amplitude: 2 V
Lead Channel Setting Pacing Pulse Width: 0.5 ms
Lead Channel Setting Sensing Sensitivity: 2.8 mV
MDC IDC MSMT BATTERY REMAINING LONGEVITY: 9 mo
MDC IDC MSMT LEADCHNL RA IMPEDANCE VALUE: 724 Ohm
MDC IDC MSMT LEADCHNL RA PACING THRESHOLD AMPLITUDE: 0.5 V
MDC IDC SET LEADCHNL RV PACING AMPLITUDE: 2.5 V
MDC IDC STAT BRADY AP VS PERCENT: 0 %
MDC IDC STAT BRADY AS VP PERCENT: 48 %

## 2013-12-07 LAB — BASIC METABOLIC PANEL
BUN: 19 mg/dL (ref 6–23)
CALCIUM: 9.5 mg/dL (ref 8.4–10.5)
CO2: 29 mEq/L (ref 19–32)
CREATININE: 1.1 mg/dL (ref 0.4–1.2)
Chloride: 97 mEq/L (ref 96–112)
GFR: 56.6 mL/min — AB (ref >60.00)
Glucose, Bld: 216 mg/dL — ABNORMAL HIGH (ref 70–99)
Potassium: 4.1 mEq/L (ref 3.5–5.1)
Sodium: 137 mEq/L (ref 135–145)

## 2013-12-07 LAB — CBC WITH DIFFERENTIAL/PLATELET
Basophils Absolute: 0 10*3/uL (ref 0.0–0.1)
Basophils Relative: 0.3 % (ref 0.0–3.0)
EOS PCT: 2.6 % (ref 0.0–5.0)
Eosinophils Absolute: 0.2 10*3/uL (ref 0.0–0.7)
HCT: 38.4 % (ref 36.0–46.0)
Hemoglobin: 12.7 g/dL (ref 12.0–15.0)
LYMPHS PCT: 30.4 % (ref 12.0–46.0)
Lymphs Abs: 2 10*3/uL (ref 0.7–4.0)
MCHC: 33.1 g/dL (ref 30.0–36.0)
MCV: 93.9 fl (ref 78.0–100.0)
MONO ABS: 0.4 10*3/uL (ref 0.1–1.0)
MONOS PCT: 6.7 % (ref 3.0–12.0)
NEUTROS PCT: 60 % (ref 43.0–77.0)
Neutro Abs: 4 10*3/uL (ref 1.4–7.7)
Platelets: 315 10*3/uL (ref 150.0–400.0)
RBC: 4.09 Mil/uL (ref 3.87–5.11)
RDW: 13.2 % (ref 11.5–14.6)
WBC: 6.6 10*3/uL (ref 4.5–10.5)

## 2013-12-07 NOTE — Patient Instructions (Addendum)
See instruction sheet for procedure   Your physician has requested that you have an echocardiogram. Echocardiography is a painless test that uses sound waves to create images of your heart. It provides your doctor with information about the size and shape of your heart and how well your heart's chambers and valves are working. This procedure takes approximately one hour. There are no restrictions for this procedure.

## 2013-12-07 NOTE — Assessment & Plan Note (Signed)
Her PPM is close to ERI and she has developed RV lead dysfunction/malfunction. Because of her young age and the need for additional leads and because her worsening LV function previously, she needs a BiV ICD if her EF remains impaired due to the cumulative risk of sudden cardiac death. Also with her young age and need for multiple leads, she will need to have her RV lead extracted. I have discussed the risks/benefits/goals/expectations of the procedure with the patient and she wishes to proceed.
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
## 2013-12-07 NOTE — Addendum Note (Signed)
Addended by: Janan Halter F on: 12/07/2013 10:34 AM   Modules accepted: Orders, Medications

## 2013-12-07 NOTE — Assessment & Plan Note (Signed)
Her symptoms are class 2A. She is on maximal medical therapy. She will undergo 2D echo prior to her device revision. No change in chf meds.

## 2013-12-11 ENCOUNTER — Encounter (HOSPITAL_COMMUNITY): Payer: Self-pay | Admitting: Pharmacy Technician

## 2013-12-13 ENCOUNTER — Ambulatory Visit (HOSPITAL_COMMUNITY): Payer: Medicare Other | Attending: Internal Medicine | Admitting: Radiology

## 2013-12-13 DIAGNOSIS — I428 Other cardiomyopathies: Secondary | ICD-10-CM

## 2013-12-13 DIAGNOSIS — Z950 Presence of cardiac pacemaker: Secondary | ICD-10-CM

## 2013-12-13 DIAGNOSIS — I509 Heart failure, unspecified: Secondary | ICD-10-CM

## 2013-12-13 DIAGNOSIS — I5042 Chronic combined systolic (congestive) and diastolic (congestive) heart failure: Secondary | ICD-10-CM

## 2013-12-13 NOTE — Progress Notes (Signed)
Echocardiogram performed.  

## 2013-12-17 MED ORDER — GENTAMICIN SULFATE 40 MG/ML IJ SOLN
80.0000 mg | INTRAMUSCULAR | Status: DC
  Filled 2013-12-17: qty 2

## 2013-12-17 MED ORDER — CHLORHEXIDINE GLUCONATE 4 % EX LIQD
60.0000 mL | Freq: Once | CUTANEOUS | Status: DC
  Filled 2013-12-17: qty 60

## 2013-12-17 MED ORDER — SODIUM CHLORIDE 0.9 % IV SOLN: INTRAVENOUS | Status: DC

## 2013-12-17 MED ORDER — CEFAZOLIN SODIUM-DEXTROSE 2-3 GM-% IV SOLR
2.0000 g | INTRAVENOUS | Status: DC
  Filled 2013-12-17: qty 50

## 2013-12-18 ENCOUNTER — Encounter (HOSPITAL_COMMUNITY)
Admission: RE | Disposition: A | Discharge status: Home / Self Care | Payer: Self-pay | Source: Ambulatory Visit | Attending: Internal Medicine

## 2013-12-18 ENCOUNTER — Ambulatory Visit (HOSPITAL_COMMUNITY)
Admission: RE | Admit: 2013-12-18 | Discharge: 2013-12-19 | Disposition: A | Discharge status: Home / Self Care | Payer: Medicare Other | Source: Ambulatory Visit | Attending: Internal Medicine | Admitting: Internal Medicine

## 2013-12-18 ENCOUNTER — Encounter (HOSPITAL_COMMUNITY): Payer: Self-pay | Admitting: General Practice

## 2013-12-18 DIAGNOSIS — Z950 Presence of cardiac pacemaker: Secondary | ICD-10-CM | POA: Insufficient documentation

## 2013-12-18 DIAGNOSIS — T82110A Breakdown (mechanical) of cardiac electrode, initial encounter: Secondary | ICD-10-CM

## 2013-12-18 DIAGNOSIS — I428 Other cardiomyopathies: Secondary | ICD-10-CM | POA: Insufficient documentation

## 2013-12-18 DIAGNOSIS — I5022 Chronic systolic (congestive) heart failure: Secondary | ICD-10-CM | POA: Insufficient documentation

## 2013-12-18 DIAGNOSIS — Y831 Surgical operation with implant of artificial internal device as the cause of abnormal reaction of the patient, or of later complication, without mention of misadventure at the time of the procedure: Secondary | ICD-10-CM | POA: Insufficient documentation

## 2013-12-18 DIAGNOSIS — F3289 Other specified depressive episodes: Secondary | ICD-10-CM | POA: Insufficient documentation

## 2013-12-18 DIAGNOSIS — N319 Neuromuscular dysfunction of bladder, unspecified: Secondary | ICD-10-CM | POA: Insufficient documentation

## 2013-12-18 DIAGNOSIS — T82190A Other mechanical complication of cardiac electrode, initial encounter: Secondary | ICD-10-CM | POA: Insufficient documentation

## 2013-12-18 DIAGNOSIS — F411 Generalized anxiety disorder: Secondary | ICD-10-CM | POA: Insufficient documentation

## 2013-12-18 DIAGNOSIS — E119 Type 2 diabetes mellitus without complications: Secondary | ICD-10-CM | POA: Insufficient documentation

## 2013-12-18 DIAGNOSIS — I5042 Chronic combined systolic (congestive) and diastolic (congestive) heart failure: Secondary | ICD-10-CM

## 2013-12-18 DIAGNOSIS — F329 Major depressive disorder, single episode, unspecified: Secondary | ICD-10-CM | POA: Insufficient documentation

## 2013-12-18 DIAGNOSIS — I119 Hypertensive heart disease without heart failure: Secondary | ICD-10-CM

## 2013-12-18 DIAGNOSIS — Z7982 Long term (current) use of aspirin: Secondary | ICD-10-CM | POA: Insufficient documentation

## 2013-12-18 DIAGNOSIS — E7800 Pure hypercholesterolemia, unspecified: Secondary | ICD-10-CM | POA: Insufficient documentation

## 2013-12-18 DIAGNOSIS — I871 Compression of vein: Secondary | ICD-10-CM | POA: Insufficient documentation

## 2013-12-18 DIAGNOSIS — I10 Essential (primary) hypertension: Secondary | ICD-10-CM | POA: Insufficient documentation

## 2013-12-18 DIAGNOSIS — G8929 Other chronic pain: Secondary | ICD-10-CM | POA: Insufficient documentation

## 2013-12-18 DIAGNOSIS — K5900 Constipation, unspecified: Secondary | ICD-10-CM | POA: Insufficient documentation

## 2013-12-18 DIAGNOSIS — K219 Gastro-esophageal reflux disease without esophagitis: Secondary | ICD-10-CM | POA: Insufficient documentation

## 2013-12-18 DIAGNOSIS — I509 Heart failure, unspecified: Secondary | ICD-10-CM | POA: Insufficient documentation

## 2013-12-18 DIAGNOSIS — M549 Dorsalgia, unspecified: Secondary | ICD-10-CM | POA: Insufficient documentation

## 2013-12-18 HISTORY — PX: BIV PACEMAKER GENERATOR CHANGE OUT: SHX5746

## 2013-12-18 HISTORY — DX: Other chronic pain: G89.29

## 2013-12-18 HISTORY — DX: Type 2 diabetes mellitus without complications: E11.9

## 2013-12-18 HISTORY — DX: Anxiety disorder, unspecified: F41.9

## 2013-12-18 HISTORY — DX: Low back pain: M54.5

## 2013-12-18 HISTORY — PX: LEAD REVISION: SHX5945

## 2013-12-18 HISTORY — PX: BI-VENTRICULAR PACEMAKER UPGRADE: SHX5752

## 2013-12-18 HISTORY — DX: Low back pain, unspecified: M54.50

## 2013-12-18 HISTORY — PX: BI-VENTRICULAR IMPLANTABLE CARDIOVERTER DEFIBRILLATOR  (CRT-D): SHX5747

## 2013-12-18 LAB — GLUCOSE, CAPILLARY
GLUCOSE-CAPILLARY: 110 mg/dL — AB (ref 70–99)
Glucose-Capillary: 194 mg/dL — ABNORMAL HIGH (ref 70–99)
Glucose-Capillary: 122 mg/dL — ABNORMAL HIGH (ref 70–99)

## 2013-12-18 LAB — SURGICAL PCR SCREEN
MRSA, PCR: NEGATIVE
Staphylococcus aureus: NEGATIVE

## 2013-12-18 SURGERY — BIV PACEMAKER GENERATOR CHANGE OUT
Anesthesia: LOCAL

## 2013-12-18 MED ORDER — MIDAZOLAM HCL 5 MG/5ML IJ SOLN
INTRAMUSCULAR | Status: AC
  Filled 2013-12-18: qty 5

## 2013-12-18 MED ORDER — ONDANSETRON HCL 4 MG/2ML IJ SOLN: 4.0000 mg | Freq: Four times a day (QID) | INTRAMUSCULAR | Status: DC | PRN

## 2013-12-18 MED ORDER — LOSARTAN POTASSIUM 50 MG PO TABS
100.0000 mg | ORAL_TABLET | Freq: Every day | ORAL | Status: DC
  Administered 2013-12-19: 100 mg via ORAL
  Filled 2013-12-18: qty 2

## 2013-12-18 MED ORDER — MUPIROCIN 2 % EX OINT
TOPICAL_OINTMENT | CUTANEOUS | Status: AC
  Filled 2013-12-18: qty 22

## 2013-12-18 MED ORDER — DIGOXIN 125 MCG PO TABS
125.0000 ug | ORAL_TABLET | Freq: Every day | ORAL | Status: DC
  Administered 2013-12-19: 10:00:00 125 ug via ORAL
  Filled 2013-12-18: qty 1

## 2013-12-18 MED ORDER — FENTANYL CITRATE 0.05 MG/ML IJ SOLN
INTRAMUSCULAR | Status: AC
  Filled 2013-12-18: qty 2

## 2013-12-18 MED ORDER — METFORMIN HCL 500 MG PO TABS
1000.0000 mg | ORAL_TABLET | Freq: Two times a day (BID) | ORAL | Status: DC
  Filled 2013-12-18 (×2): qty 2

## 2013-12-18 MED ORDER — POTASSIUM CHLORIDE CRYS ER 20 MEQ PO TBCR
20.0000 meq | EXTENDED_RELEASE_TABLET | Freq: Two times a day (BID) | ORAL | Status: DC
  Administered 2013-12-18 – 2013-12-19 (×3): 20 meq via ORAL
  Filled 2013-12-18 (×4): qty 1

## 2013-12-18 MED ORDER — VENLAFAXINE HCL ER 150 MG PO CP24
300.0000 mg | ORAL_CAPSULE | Freq: Every morning | ORAL | Status: DC
  Administered 2013-12-19: 10:00:00 300 mg via ORAL
  Filled 2013-12-18: qty 2

## 2013-12-18 MED ORDER — CEFAZOLIN SODIUM 1-5 GM-% IV SOLN
1.0000 g | Freq: Four times a day (QID) | INTRAVENOUS | Status: AC
  Administered 2013-12-18 – 2013-12-19 (×3): 1 g via INTRAVENOUS
  Filled 2013-12-18 (×3): qty 50

## 2013-12-18 MED ORDER — MUPIROCIN 2 % EX OINT
TOPICAL_OINTMENT | Freq: Two times a day (BID) | CUTANEOUS | Status: DC
  Filled 2013-12-18: qty 22

## 2013-12-18 MED ORDER — FUROSEMIDE 80 MG PO TABS
80.0000 mg | ORAL_TABLET | Freq: Four times a day (QID) | ORAL | Status: DC
  Administered 2013-12-18 – 2013-12-19 (×3): 80 mg via ORAL
  Filled 2013-12-18 (×7): qty 1

## 2013-12-18 MED ORDER — PANTOPRAZOLE SODIUM 40 MG PO TBEC
40.0000 mg | DELAYED_RELEASE_TABLET | Freq: Every day | ORAL | Status: DC
  Administered 2013-12-19: 10:00:00 40 mg via ORAL
  Filled 2013-12-18: qty 1

## 2013-12-18 MED ORDER — CLONAZEPAM 1 MG PO TABS
1.0000 mg | ORAL_TABLET | Freq: Two times a day (BID) | ORAL | Status: DC
  Administered 2013-12-18 – 2013-12-19 (×2): 1 mg via ORAL
  Filled 2013-12-18 (×3): qty 1

## 2013-12-18 MED ORDER — ASPIRIN EC 81 MG PO TBEC
81.0000 mg | DELAYED_RELEASE_TABLET | Freq: Every day | ORAL | Status: DC
  Administered 2013-12-19: 10:00:00 81 mg via ORAL
  Filled 2013-12-18: qty 1

## 2013-12-18 MED ORDER — CARVEDILOL 6.25 MG PO TABS
6.2500 mg | ORAL_TABLET | Freq: Two times a day (BID) | ORAL | Status: DC
  Administered 2013-12-18 – 2013-12-19 (×2): 6.25 mg via ORAL
  Filled 2013-12-18 (×4): qty 1

## 2013-12-18 MED ORDER — ZOLPIDEM TARTRATE 5 MG PO TABS
5.0000 mg | ORAL_TABLET | Freq: Every day | ORAL | Status: DC
  Administered 2013-12-18: 5 mg via ORAL
  Filled 2013-12-18: qty 1

## 2013-12-18 MED ORDER — BUPROPION HCL ER (XL) 150 MG PO TB24
150.0000 mg | ORAL_TABLET | Freq: Every morning | ORAL | Status: DC
  Administered 2013-12-19: 150 mg via ORAL
  Filled 2013-12-18: qty 1

## 2013-12-18 MED ORDER — ACETAMINOPHEN 325 MG PO TABS: 325.0000 mg | ORAL_TABLET | ORAL | Status: DC | PRN

## 2013-12-18 MED ORDER — HYDROCODONE-ACETAMINOPHEN 10-325 MG PO TABS
1.0000 | ORAL_TABLET | Freq: Four times a day (QID) | ORAL | Status: DC | PRN
  Administered 2013-12-18 – 2013-12-19 (×3): 1 via ORAL
  Filled 2013-12-18 (×3): qty 1

## 2013-12-18 MED ORDER — METFORMIN HCL 500 MG PO TABS: 1000.0000 mg | ORAL_TABLET | Freq: Two times a day (BID) | ORAL | Status: DC

## 2013-12-18 MED ORDER — ATORVASTATIN CALCIUM 40 MG PO TABS
40.0000 mg | ORAL_TABLET | Freq: Every morning | ORAL | Status: DC
  Administered 2013-12-19: 40 mg via ORAL
  Filled 2013-12-18: qty 1

## 2013-12-18 MED ORDER — LIDOCAINE HCL (PF) 1 % IJ SOLN
INTRAMUSCULAR | Status: AC
  Filled 2013-12-18: qty 60

## 2013-12-18 MED ORDER — GLIPIZIDE 10 MG PO TABS
10.0000 mg | ORAL_TABLET | Freq: Two times a day (BID) | ORAL | Status: DC
  Administered 2013-12-18 – 2013-12-19 (×2): 10 mg via ORAL
  Filled 2013-12-18 (×4): qty 1

## 2013-12-18 NOTE — H&P (Signed)
  ICD Criteria  Current LVEF:30% ;Obtained < 1 month ago.  NYHA Functional Classification: Class II  Heart Failure History:  Yes, Duration of heart failure since onset is > 9 months  Non-Ischemic Dilated Cardiomyopathy History:  Yes, timeframe is > 9 months  Atrial Fibrillation/Atrial Flutter:  No.  Ventricular Tachycardia History:  No.  Cardiac Arrest History:  No  History of Syndromes with Risk of Sudden Death:  No.  Previous ICD:  No.  Electrophysiology Study: No.  Prior MI: No.  PPM: Yes, PPM type is: Dual Chamber.  OSA:  No  Patient Life Expectancy of >=1 year: Yes.  Anticoagulation Therapy:  Patient is NOT on anticoagulation therapy.   Beta Blocker Therapy:  Yes.   Ace Inhibitor/ARB Therapy:  Yes.

## 2013-12-18 NOTE — CV Procedure (Signed)
BiV ICD implant via the left subclavian vein without immediate complication. [id number].
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
## 2013-12-18 NOTE — Interval H&P Note (Signed)
History and Physical Interval Note:Since prior clinic visit, no change in the history, physical exam, assessment and plan. She has a high impedence pacing lead, and persistant LV dysfunction with an EF of 20-30% for the past 8 years despite maximal medical therapy. Her heart failure symptoms have worsened. Will plan to proceed with insertion of an ICD lead and hopefully an LV lead.  12/18/2013 11:34 AM  [name]  has presented today for surgery, with the diagnosis of lead malfunction/chf  The various methods of treatment have been discussed with the patient and family. After consideration of risks, benefits and other options for treatment, the patient has consented to  Procedure(s): BIV PACEMAKER GENERATOR CHANGE OUT (N/A) LEAD REVISION (N/A) as a surgical intervention .  The patient's history has been reviewed, patient examined, no change in status, stable for surgery.  I have reviewed the patient's chart and labs.  Questions were answered to the patient's satisfaction.     Mikle Bosworth.D.

## 2013-12-18 NOTE — Progress Notes (Signed)
Utilization Review Completed.Donne Anon T4/02/2014

## 2013-12-18 NOTE — CV Procedure (Addendum)
     PERCUTANEOUS CARDIAC VEIN INTERVENTION   [name] is a 63 y.o. female  INDICATION: Left ventricular posterior cardiac vein stenosis, preventing LV lead advancement during biventricular pacemaker.   PROCEDURE: Balloon angioplasty of posterior vein of the left ventricle.    PROCEDURE TECHNIQUE:  Dr. Lovena Le demonstrated a stenosis in the proximal to mid segment in the left posterior vein of the left ventricle. A .014 mm Luge angioplasty wire was successfully placed beyond the region of stenosis. We then used a 2.25 mm semi-compliant balloon to dilate the region of narrowing. Peak pressure was 6 atmospheres and no "waist" was noted. A single inflation was performed.  Dr. Lovena Le then carried on with his procedure, being able to advance a smaller LV lead to the desired position.  IMPRESSIONS:  Successful dilatation of the posterior left ventricular vein to 2.25 mm diameter.   RECOMMENDATION:  No specific recommendations other than as per Dr. Lovena Le.[name]    Sinclair Grooms, MD 12/18/2013 3:23 PM

## 2013-12-19 ENCOUNTER — Encounter (HOSPITAL_COMMUNITY): Payer: Self-pay | Admitting: Physician Assistant

## 2013-12-19 ENCOUNTER — Ambulatory Visit (HOSPITAL_COMMUNITY): Payer: Medicare Other

## 2013-12-19 LAB — GLUCOSE, CAPILLARY: Glucose-Capillary: 142 mg/dL — ABNORMAL HIGH (ref 70–99)

## 2013-12-19 MED ORDER — YOU HAVE A PACEMAKER BOOK
Freq: Once | Status: AC
  Administered 2013-12-19: 12:00:00
  Filled 2013-12-19 (×2): qty 1

## 2013-12-19 NOTE — Op Note (Signed)
NAMEHARTLEY, BURY NO.:  [id number]  MEDICAL RECORD NO.:  [id number]  LOCATION:  6C05C                        FACILITY:  [facility]  PHYSICIAN:  Champ Mungo. Lovena Le, MD    DATE OF BIRTH:  Jan 18, 1951  DATE OF PROCEDURE:  12/18/2013 DATE OF DISCHARGE:                              OPERATIVE REPORT   PROCEDURES PREFORMED:  Upgrade of dual-chamber pacemaker, for which there was evidence of right ventricular lead malfunction and insertion of a new defibrillator lead, a new left ventricular pacing lead, removal of the old dual-chamber pacemaker generator, and insertion of a new biventricular implantable cardioverter-defibrillator, in a patient with longstanding history of a nonischemic cardiomyopathy, chronic class II systolic heart failure, and left bundle-branch block with a QRS duration of 165 milliseconds.  INTRODUCTION:  The patient is a 63 year old woman who is well known to me.  She has a long-standing history at least 8 years of a nonischemic cardiomyopathy.  Her congestive heart failure symptoms have been fairly well controlled and at times even class I despite an ejection fraction which has varied over the years between 25% and 35%.  She is on maximal medical therapy.  The patient underwent attempted BiV pacemaker insertion approximately six years ago.  The procedure was notable that LV lead placement could not be obtained.  Over the years, she has been stable, but has had increasingly worse shortness of breath and fatigue. Her heart failure is class IIB.  The patient was found to have an elevated ventricular pacing impedance of 2500 ohms and increasing pacing thresholds and is now referred for upgrade from her dual-chamber pacemaker and attempted insertion of a biventricular ICD.  DESCRIPTION OF PROCEDURE:  After informed consent was obtained, the patient was taken to the Diagnostic Electrophysiology Laboratory in the fasting state.  After usual preparation and  draping, intravenous fentanyl and midazolam were given for sedation.  Of note, increasingly large amounts of these medications were required.  The left subclavian vein was subsequently punctured.  Prior to this, 10 mL of IV contrast was injected to the left subclavian vein, demonstrated that it was in fact patent.  The Medtronic model 6935, 55 cm, active fixation defibrillation lead, serial [id number] was advanced under fluoroscopic guidance into the right ventricle.  Mapping was carried out at the final site.  The R-waves measured 10 mV and with the lead actively fixed, the pacing impedance was 784 ohms.  The pacing threshold was 0.7 V at 0.5 milliseconds.  A 10 V pacing did not stimulate the diaphragm.  With these satisfactory parameters, attention was then turned to placement of the left ventricular lead.  The coronary sinus guiding catheter was inserted over a long guidewire into the right atrium.  Using a 6-French hexapolar EP catheter, the coronary sinus was cannulated and venography of the coronary sinus carried out.  This demonstrated a very small middle cardiac vein, a small lateral vein, and a very small high lateral vein before terminating into the anterior vein.  A angioplasty guidewire was inserted into the lateral vein and advanced through anastomosis, overlaid back into the right atrium.  A Medtronic quadripolar LV pacing catheter was advanced over the guidewire, but could not  be advanced over a stenosis which was approximately 2 cm from the coronary sinus lateral vein ostium.  At this point, multiple wires were used.  A subselective catheter was used and despite all of the above, the quadripolar LV pacing lead could not be advanced.  At this point, I enlisted the help of Dr. Daneen Schick.  Angioplasty of the lateral vein was carried out utilizing a 2.25-mm Compliant 15-mm balloon with the vein and balloon insufflated up to 4 atmospheres of pressure.  At this point,  the Medtronic model 4396, 88 cm bipolar pacing lead, serial [id number] was inserted over the guidewire and was used to traverse past the stenotic area and into the lateral vein of the left ventricle, approximately 2/3rd from base to apex.  In this location, there was no diaphragmatic stimulation.  The pacing thresholds were satisfactory.  The guiding catheter was liberated in the usual manner.  The new LV lead and the defibrillator lead was secured to the subpectoralis fascia with a figure- of-eight silk suture.  The sewing sleeves were secured with silk suture. The pocket was then opened and the old dual-chamber pacemaker was removed, the old right ventricular pacing lead which had a very elevated pacing impedance was capped.  The old atrial pacing lead was found to be working satisfactorily.  The Medtronic Viva XT BiV ICD, serial [id number] was connected to the old atrial lead and the new defibrillator lead and new LV pacing lead and placed back in the subcutaneous pocket.  The pocket was irrigated with antibiotic irrigation.  The incision was closed with 2-0 and 3-0 Vicryl.  During the procedure, we had difficult time sedating the patient satisfactorily.  For this reason, defibrillation threshold testing was not carried out.  Benzoin and Steri-Strips were painted on the skin.  A pressure dressing was applied, and the patient was returned to her room in satisfactory condition.  COMPLICATIONS:  There were no immediate procedure complications.  RESULTS:  Demonstrate successful implantation of a biventricular implantable cardioverter-defibrillator in a patient with a previously implanted dual-chamber pacemaker who had developed right ventricular lead dysfunction.     Champ Mungo. Lovena Le, MD     GWT/MEDQ  D:  12/18/2013  T:  12/19/2013  Job:  [id number]

## 2013-12-19 NOTE — Discharge Summary (Signed)
Physician Discharge Summary      Patient ID: [name] MRN: [id number] DOB/AGE: [birth year] 63 y.o.  Admit date: 12/18/2013 Discharge date: 12/19/2013  Admission Diagnoses:  Chronic systolic heart failure  Discharge Diagnoses:  Active Problems:   Chronic systolic heart failure   Nonischemic cardiomyopathy  Discharged Condition: stable  Hospital Course:  She is a pleasant 63 yo woman with a h/o a non-ischemic CM, chronic systolic heart failure, class 2a, HTN, and DM. She underwent foot surgery several months ago without difficulty. The patient continues to do well but has not been exercising. She denies chest pain or sob. No edema. No syncope. She gets sob walking up a hill.  She has a high impedence pacing lead, and persistant LV dysfunction with an EF of 20-30% for the past 8 years despite maximal medical therapy. Her heart failure symptoms have worsened. Will plan to proceed with insertion of an ICD lead and hopefully an LV lead.  The patient underwent BiV ICD implant with new LV lead via the left subclavian vein without immediate complication.  She had left ventricular posterior cardiac vein stenosis, preventing LV lead advancement during biventricular pacemaker upgrade. She then had balloon angioplasty of posterior vein of the left ventricle.  No pneumothorax was seen on post-procedure CXR. The patient was seen by Dr. Lovena Le who felt she was stable for DC home. Follow up arranged.    Consults: None  Significant Diagnostic Studies:    Treatments: See above  Discharge Exam: Blood pressure 125/68, pulse 86, temperature 99.4 F (37.4 C), temperature source Oral, resp. rate 17, height '5\' 2"'  (1.575 m), weight 139 lb 1.8 oz (63.1 kg), SpO2 97.00%.  Disposition: 50-Hospice/Home      Discharge Orders   Future Appointments Provider Department Dept Phone   12/29/2013 9:00 AM [facility] [facility] [place] [phone]   01/02/2014 9:50 AM Liliane Shi, PA-C [facility] [phone]   Future Orders Complete By Expires   Call MD for:  redness, tenderness, or signs of infection (pain, swelling, redness, odor or green/yellow discharge around incision site)  As directed    Diet - low sodium heart healthy  As directed    Increase activity slowly  As directed        Medication List         ACCU-CHEK AVIVA PLUS W/DEVICE Kit  As directed     ACCU-CHEK SMARTVIEW CONTROL Liqd  As directed     ACCU-CHEK SMARTVIEW test strip  Generic drug:  glucose blood  As directed     aspirin EC 81 MG tablet  Take 81 mg by mouth daily.     atorvastatin 40 MG tablet  Commonly known as:  LIPITOR  Take 40 mg by mouth every morning.     buPROPion 150 MG 24 hr tablet  Commonly known as:  WELLBUTRIN XL  Take 150 mg by mouth every morning.     carvedilol 6.25 MG tablet  Commonly known as:  COREG  Take 1 tablet (6.25 mg total) by mouth 2 (two) times daily with a meal.     clonazePAM 0.5 MG tablet  Commonly known as:  KLONOPIN  Take 1 mg by mouth daily as needed for anxiety.     digoxin 0.125 MG tablet  Commonly known as:  LANOXIN  Take 1 tablet (125 mcg total) by mouth daily.     FARXIGA 10 MG Tabs  Generic drug:  Dapagliflozin Propanediol  Take 10 mg by mouth  daily.     furosemide 40 MG tablet  Commonly known as:  LASIX  Take 80 mg by mouth 4 (four) times daily.     glipiZIDE 5 MG tablet  Commonly known as:  GLUCOTROL  Take 10 mg by mouth 2 (two) times daily before a meal.     HYDROcodone-acetaminophen 10-325 MG per tablet  Commonly known as:  NORCO  Take 1 tablet by mouth 4 (four) times daily as needed for moderate pain.     losartan 100 MG tablet  Commonly known as:  COZAAR  Take 100 mg by mouth daily.     metFORMIN 500 MG tablet  Commonly known as:  GLUCOPHAGE  Take 1,000 mg by mouth 2 (two) times daily with a meal.     omeprazole 20 MG capsule  Commonly known as:  PRILOSEC  Take 20 mg by mouth 2 (two) times daily.       potassium chloride SA 20 MEQ tablet  Commonly known as:  K-DUR,KLOR-CON  Take 20 mEq by mouth 2 (two) times daily.     venlafaxine XR 150 MG 24 hr capsule  Commonly known as:  EFFEXOR-XR  Take 300 mg by mouth every morning.     zolpidem 10 MG tablet  Commonly known as:  AMBIEN  Take 20 mg by mouth at bedtime.       Follow-up Information   Follow up with Richardson Dopp, PA-C On 01/02/2014. (9:50 AM)    Specialty:  Physician Assistant   Contact information:   [address] [place] [address] [phone]       Signed: Tarri Fuller 12/19/2013, 11:09 AM

## 2013-12-19 NOTE — Progress Notes (Signed)
Patient ID: MICAH FEHER, female   DOB: Jun 05, 1951, 63 y.o.   MRN: [id number]   Patient Name: [name] Date of Encounter: 12/19/2013     Active Problems:   Chronic systolic heart failure    SUBJECTIVE  Minimal incisional soreness  CURRENT MEDS . aspirin EC  81 mg Oral Daily  . atorvastatin  40 mg Oral q morning - 10a  . buPROPion  150 mg Oral q morning - 10a  . carvedilol  6.25 mg Oral BID WC  . clonazePAM  1 mg Oral BID  . digoxin  125 mcg Oral Daily  . furosemide  80 mg Oral QID  . glipiZIDE  10 mg Oral BID AC  . losartan  100 mg Oral Daily  . [START ON 12/21/2013] metFORMIN  1,000 mg Oral BID WC  . pantoprazole  40 mg Oral Daily  . potassium chloride SA  20 mEq Oral BID  . venlafaxine XR  300 mg Oral q morning - 10a  . zolpidem  5 mg Oral QHS    OBJECTIVE  Filed Vitals:   12/18/13 2023 12/19/13 0007 12/19/13 0424 12/19/13 0800  BP: 134/71 129/65 143/72 125/68  Pulse: 81 76 80 86  Temp: 97.9 F (36.6 C) 98.1 F (36.7 C) 97.7 F (36.5 C) 99.4 F (37.4 C)  TempSrc: Oral Oral Oral Oral  Resp: 17 18 17 17   Height:      Weight:  139 lb 1.8 oz (63.1 kg)    SpO2: 98% 98% 97% 97%    Intake/Output Summary (Last 24 hours) at 12/19/13 1032 Last data filed at 12/19/13 1000  Gross per 24 hour  Intake     50 ml  Output   3551 ml  Net  -3501 ml   Filed Weights   12/18/13 0753 12/19/13 0007  Weight: 138 lb (62.596 kg) 139 lb 1.8 oz (63.1 kg)    PHYSICAL EXAM  General: Pleasant, NAD. Neuro: Alert and oriented X 3. Moves all extremities spontaneously. HEENT:  Normal  Neck: Supple without bruits or JVD. Lungs:  Resp regular and unlabored, CTA. No hematoma Heart: RRR no s3, s4, or murmurs. Abdomen: Soft, non-tender, non-distended, BS + x 4.  Extremities: No clubbing, cyanosis or edema. DP/PT/Radials 2+ and equal bilaterally.  Accessory Clinical Findings  ICD interogation demonstrated normal function.  CBC No results found for this basename: WBC,  NEUTROABS, HGB, HCT, MCV, PLT,  in the last 72 hours Basic Metabolic Panel No results found for this basename: NA, K, CL, CO2, GLUCOSE, BUN, CREATININE, CALCIUM, MG, PHOS,  in the last 72 hours Liver Function Tests No results found for this basename: AST, ALT, ALKPHOS, BILITOT, PROT, ALBUMIN,  in the last 72 hours No results found for this basename: LIPASE, AMYLASE,  in the last 72 hours Cardiac Enzymes No results found for this basename: CKTOTAL, CKMB, CKMBINDEX, TROPONINI,  in the last 72 hours BNP No components found with this basename: POCBNP,  D-Dimer No results found for this basename: DDIMER,  in the last 72 hours Hemoglobin A1C No results found for this basename: HGBA1C,  in the last 72 hours Fasting Lipid Panel No results found for this basename: CHOL, HDL, LDLCALC, TRIG, CHOLHDL, LDLDIRECT,  in the last 72 hours Thyroid Function Tests No results found for this basename: TSH, T4TOTAL, FREET3, T3FREE, THYROIDAB,  in the last 72 hours  TELE  NSR with Biv Pacing  ECG  NSR with BiV pacing  Radiology/Studies  Dg Chest 2 View  12/19/2013   CLINICAL DATA:  Chest pain  EXAM: CHEST  2 VIEW  COMPARISON:  11/27/2013  FINDINGS: There is been interval addition of defibrillator leads. The cardiac shadow is stable. The lungs are clear. No pneumothorax is noted.  IMPRESSION: No acute abnormality seen.   Electronically Signed   By: Inez Catalina M.D.   On: 12/19/2013 07:51   Dg Chest 2 View  11/27/2013   CLINICAL DATA:  Suspect pacemaker dysfunction  EXAM: CHEST  2 VIEW  COMPARISON:  DG CHEST 2 VIEW dated 06/20/2012  FINDINGS: The lungs are well-expanded and clear. The cardiac silhouette is normal in size. The positioning of the pacemaker leads appears unchanged. No fracture of the pacemaker leads is demonstrated. The generator overlies the left pectoral region. There is no pleural effusion or pneumothorax. The observed portions of the bony thorax exhibit no acute abnormalities.  IMPRESSION:  There is no evidence of active cardiopulmonary disease. The pacemaker electrodes appear unchanged in position.   Electronically Signed   By: David  Martinique   On: 11/27/2013 14:15    ASSESSMENT AND PLAN  1. Chronic systolic heart failure 2. Broken PPM lead 3. S/p difficult BiV ICD upgrade from a DDD PM to a BiV ICD Rec: ok for discharge home with usual followup. I told the patient to stop drinking ETOH  Jenessa Gillingham,M.D.  12/19/2013 10:32 AM

## 2013-12-19 NOTE — Care Management Note (Addendum)
  Page 1 of 1   12/19/2013     11:54:28 AM   CARE MANAGEMENT NOTE 12/19/2013  Patient:  [name], [name]   Account Number:  [id number]  Date Initiated:  12/19/2013  Documentation initiated by:  Myron Lona  Subjective/Objective Assessment:   Admitted with HF     Action/Plan:   CM to follow for dispositon needs   Anticipated DC Date:  12/19/2013   Anticipated DC Plan:  HOME/SELF CARE         Choice offered to / List presented to:             Status of service:   Medicare Important Message given?   (If response is "NO", the following Medicare IM given date fields will be blank) Date Medicare IM given:   Date Additional Medicare IM given:    Discharge Disposition:  HOME/SELF CARE  Per UR Regulation:  Reviewed for med. necessity/level of care/duration of stay  If discussed at [place] of Stay Meetings, dates discussed:    Comments:  OPIB status D/c 12/19/2013  home / self care Kniyah Khun RN, BSN, [place], CCM 12/19/2013

## 2013-12-19 NOTE — Discharge Instructions (Signed)
Supplemental Discharge Instructions for  Pacemaker/Defibrillator Patients  Activity No heavy lifting or vigorous activity with your left/right arm for 6 to 8 weeks.  Do not raise your left/right arm above your head for one week.  Gradually raise your affected arm as drawn below.            12/22/13                   12/23/13                     12/24/13                  12/25/13  NO DRIVING for 1 week  ; you may begin driving on [id number]. WOUND CARE   Keep the wound area clean and dry.  Do not get this area wet for one week. No showers for one week; you may shower on        12/26/13                                           .   The tape/steri-strips on your wound will fall off; do not pull them off.  No bandage is needed on the site.  DO  NOT apply any creams, oils, or ointments to the wound area.   If you notice any drainage or discharge from the wound, any swelling or bruising at the site, or you develop a fever > 101? F after you are discharged home, call the office at once.  Special Instructions   You are still able to use cellular telephones; use the ear opposite the side where you have your pacemaker/defibrillator.  Avoid carrying your cellular phone near your device.   When traveling through airports, show security personnel your identification card to avoid being screened in the metal detectors.  Ask the security personnel to use the hand wand.   Avoid arc welding equipment, MRI testing (magnetic resonance imaging), TENS units (transcutaneous nerve stimulators).  Call the office for questions about other devices.   Avoid electrical appliances that are in poor condition or are not properly grounded.   Microwave ovens are safe to be near or to operate.  Additional information for defibrillator patients should your device go off:   If your device goes off ONCE and you feel fine afterward, notify the device clinic nurses.   If your device goes off ONCE and you do not feel well afterward, call  911.   If your device goes off TWICE, call 911.   If your device goes off THREE times in one day, call 911.  DO NOT DRIVE YOURSELF OR A FAMILY MEMBER WITH A DEFIBRILLATOR TO THE HOSPITAL--CALL 911.

## 2013-12-26 ENCOUNTER — Encounter (HOSPITAL_COMMUNITY): Payer: Self-pay | Admitting: *Deleted

## 2013-12-29 ENCOUNTER — Encounter: Payer: Self-pay | Admitting: Internal Medicine

## 2013-12-29 ENCOUNTER — Ambulatory Visit (INDEPENDENT_AMBULATORY_CARE_PROVIDER_SITE_OTHER): Payer: Medicare Other | Admitting: *Deleted

## 2013-12-29 DIAGNOSIS — I5022 Chronic systolic (congestive) heart failure: Secondary | ICD-10-CM

## 2013-12-29 DIAGNOSIS — I428 Other cardiomyopathies: Secondary | ICD-10-CM

## 2013-12-29 DIAGNOSIS — F341 Dysthymic disorder: Secondary | ICD-10-CM

## 2013-12-29 LAB — MDC_IDC_ENUM_SESS_TYPE_INCLINIC
Battery Remaining Longevity: 51 mo
Brady Statistic AP VS Percent: 0.03 %
Brady Statistic AS VS Percent: 1.28 %
Brady Statistic RA Percent Paced: 0.06 %
Brady Statistic RV Percent Paced: 7.83 %
Date Time Interrogation Session: 20150417095019
HIGH POWER IMPEDANCE MEASURED VALUE: 68 Ohm
HighPow Impedance: 190 Ohm
Lead Channel Impedance Value: 456 Ohm
Lead Channel Impedance Value: 475 Ohm
Lead Channel Pacing Threshold Pulse Width: 0.4 ms
Lead Channel Pacing Threshold Pulse Width: 0.8 ms
Lead Channel Sensing Intrinsic Amplitude: 17.125 mV
Lead Channel Sensing Intrinsic Amplitude: 19.75 mV
Lead Channel Sensing Intrinsic Amplitude: 4.625 mV
Lead Channel Setting Pacing Amplitude: 3.5 V
Lead Channel Setting Pacing Amplitude: 4 V
Lead Channel Setting Pacing Pulse Width: 0.4 ms
Lead Channel Setting Pacing Pulse Width: 0.8 ms
MDC IDC MSMT BATTERY VOLTAGE: 3.01 V
MDC IDC MSMT LEADCHNL LV IMPEDANCE VALUE: 399 Ohm
MDC IDC MSMT LEADCHNL LV IMPEDANCE VALUE: 760 Ohm
MDC IDC MSMT LEADCHNL LV PACING THRESHOLD AMPLITUDE: 0.75 V
MDC IDC MSMT LEADCHNL RA IMPEDANCE VALUE: 608 Ohm
MDC IDC MSMT LEADCHNL RA PACING THRESHOLD AMPLITUDE: 0.5 V
MDC IDC MSMT LEADCHNL RA PACING THRESHOLD PULSEWIDTH: 0.4 ms
MDC IDC MSMT LEADCHNL RA SENSING INTR AMPL: 4.5 mV
MDC IDC MSMT LEADCHNL RV PACING THRESHOLD AMPLITUDE: 1.125 V
MDC IDC SET LEADCHNL RA PACING AMPLITUDE: 1.5 V
MDC IDC SET LEADCHNL RV SENSING SENSITIVITY: 0.3 mV
MDC IDC SET ZONE DETECTION INTERVAL: 300 ms
MDC IDC STAT BRADY AP VP PERCENT: 0.03 %
MDC IDC STAT BRADY AS VP PERCENT: 98.67 %
Zone Setting Detection Interval: 350 ms
Zone Setting Detection Interval: 350 ms
Zone Setting Detection Interval: 360 ms

## 2013-12-29 NOTE — Progress Notes (Signed)
Wound check ICD with EKG

## 2014-01-02 ENCOUNTER — Ambulatory Visit: Payer: Medicare Other | Admitting: Physician Assistant

## 2014-01-02 ENCOUNTER — Encounter: Payer: Medicare Other | Admitting: Physician Assistant

## 2014-01-12 ENCOUNTER — Encounter: Payer: Self-pay | Admitting: Internal Medicine

## 2014-01-16 ENCOUNTER — Other Ambulatory Visit (HOSPITAL_COMMUNITY)
Admission: RE | Admit: 2014-01-16 | Discharge: 2014-01-16 | Disposition: A | Discharge status: Home / Self Care | Payer: Medicare Other | Source: Ambulatory Visit | Attending: Adult Health | Admitting: Adult Health

## 2014-01-16 ENCOUNTER — Encounter: Payer: Self-pay | Admitting: Adult Health

## 2014-01-16 ENCOUNTER — Ambulatory Visit (INDEPENDENT_AMBULATORY_CARE_PROVIDER_SITE_OTHER): Payer: Medicare Other | Admitting: Adult Health

## 2014-01-16 VITALS — BP 118/76 | HR 72 | Ht 62.0 in | Wt 132.0 lb

## 2014-01-16 DIAGNOSIS — Z124 Encounter for screening for malignant neoplasm of cervix: Secondary | ICD-10-CM | POA: Insufficient documentation

## 2014-01-16 DIAGNOSIS — Z1212 Encounter for screening for malignant neoplasm of rectum: Secondary | ICD-10-CM

## 2014-01-16 DIAGNOSIS — Z01419 Encounter for gynecological examination (general) (routine) without abnormal findings: Secondary | ICD-10-CM

## 2014-01-16 DIAGNOSIS — R87810 Cervical high risk human papillomavirus (HPV) DNA test positive: Secondary | ICD-10-CM | POA: Insufficient documentation

## 2014-01-16 DIAGNOSIS — Z1151 Encounter for screening for human papillomavirus (HPV): Secondary | ICD-10-CM | POA: Insufficient documentation

## 2014-01-16 LAB — HEMOCCULT GUIAC POC 1CARD (OFFICE): Fecal Occult Blood, POC: NEGATIVE

## 2014-01-16 NOTE — Progress Notes (Signed)
Patient ID: [name], female   DOB: 04-15-1951, 63 y.o.   MRN: [id number] History of Present Illness: [name] is a 80 year white female, married in for pap and physical.   Current Medications, Allergies, Past Medical History, Past Surgical History, Family History and Social History were reviewed in [employer] record.   Past Medical History  Diagnosis Date  . Chronic constipation   . Cardiomyopathy   . HTN (hypertension)   . Heart failure     Chronic combined  . Hypercholesteremia   . GERD (gastroesophageal reflux disease)   . Depression   . Neurogenic bladder     2008 nerve damage s/p back ssurgery  . Type II diabetes mellitus   . Chronic lower back pain   . Anxiety    Past Surgical History  Procedure Laterality Date  . Cesarean section  1981  . Back surgery  2005  . 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 Upper gastrointestinal endoscopy  MAY 2011 w/ PROP    MILD GASTRITIS 2o ETOH  . Cholecystectomy    . Achilles tendon surgery Right 06/2012  . Gastrocnemius recession  06/24/2012    Procedure: GASTROCNEMIUS SLIDE;  Surgeon: Melanie Rhein, MD;  Location: [facility] ORS;  Service: Orthopedics;  Laterality: Right;  . Bi-ventricular pacemaker upgrade  12/18/2013    w/defibrillator  . Tubal ligation  ~ 1984  . Breast biopsy Left 1973    benign  . Breast lumpectomy Left 1973  . Posterior lumbar fusion  2006; 2008  . Pacemaker insertion  2006    Dual chamber MDT pacemaker implanted - subsequent upgrade to CRTD 12-2013  . Hernia repair    . Bi-ventricular implantable cardioverter defibrillator  (crt-d)  12-18-2013    upgrade of previously implanted dual chamber pacemaker to MDT CRTD with RV lead revision by Dr [name]  Current outpatient prescriptions:ACCU-CHEK SMARTVIEW test strip, As directed, Disp: , Rfl: ;  aspirin EC 81 MG tablet, Take 81 mg by mouth daily., Disp: , Rfl: ;  atorvastatin (LIPITOR) 40 MG tablet, Take 40 mg by mouth  every morning. , Disp: , Rfl: ;  Blood Glucose Calibration (ACCU-CHEK SMARTVIEW CONTROL) LIQD, As directed, Disp: , Rfl: ;  Blood Glucose Monitoring Suppl (ACCU-CHEK AVIVA PLUS) W/DEVICE KIT, As directed, Disp: , Rfl:  buPROPion (WELLBUTRIN XL) 150 MG 24 hr tablet, Take 150 mg by mouth every morning., Disp: , Rfl: ;  carvedilol (COREG) 6.25 MG tablet, Take 1 tablet (6.25 mg total) by mouth 2 (two) times daily with a meal., Disp: 180 tablet, Rfl: 3;  clonazePAM (KLONOPIN) 0.5 MG tablet, Take 1 mg by mouth daily as needed for anxiety. , Disp: , Rfl: ;  Dapagliflozin Propanediol (FARXIGA) 10 MG TABS, Take 10 mg by mouth daily., Disp: , Rfl:  digoxin (LANOXIN) 0.125 MG tablet, Take 1 tablet (125 mcg total) by mouth daily., Disp: 90 tablet, Rfl: 3;  furosemide (LASIX) 40 MG tablet, Take 80 mg by mouth 4 (four) times daily. , Disp: , Rfl: ;  glipiZIDE (GLUCOTROL) 5 MG tablet, Take 10 mg by mouth 2 (two) times daily before a meal. , Disp: , Rfl: ;  HYDROcodone-acetaminophen (NORCO) 10-325 MG per tablet, Take 1 tablet by mouth 4 (four) times daily as needed for moderate pain. , Disp: , Rfl:  losartan (COZAAR) 100 MG tablet, Take 100 mg by mouth daily. , Disp: , Rfl: ;  metFORMIN (GLUCOPHAGE) 500 MG tablet, Take 1,000 mg  by mouth 2 (two) times daily with a meal. , Disp: , Rfl: ;  omeprazole (PRILOSEC) 20 MG capsule, Take 20 mg by mouth 2 (two) times daily. , Disp: , Rfl: ;  potassium chloride SA (K-DUR,KLOR-CON) 20 MEQ tablet, Take 20 mEq by mouth 2 (two) times daily. , Disp: , Rfl:  venlafaxine (EFFEXOR-XR) 150 MG 24 hr capsule, Take 300 mg by mouth every morning. , Disp: , Rfl: ;  zolpidem (AMBIEN) 10 MG tablet, Take 20 mg by mouth at bedtime. , Disp: , Rfl:  Review of Systems: Patient denies any headaches, blurred vision, shortness of breath, chest pain, abdominal pain, problems with bowel movements(hx constipation), urination(neurogenic bladder), or intercourse, not having sex, still does I&O cath where she does  not empty her bladder, no joint pain, moods better,seems happier.    Physical Exam:BP 118/76  Pulse 72  Ht _0  (1.575 m)  Wt 132 lb (59.875 kg)  BMI 24.14 kg/m2 General:  Well developed, well nourished, no acute distress Skin:  Warm and dry Neck:  Midline trachea, normal thyroid Lungs; Clear to auscultation bilaterally Breast:  No dominant palpable mass, retraction, or nipple discharge, has defibrillator left chest Cardiovascular: Regular rate and rhythm Abdomen:  Soft, non tender, no hepatosplenomegaly Pelvic:  External genitalia is normal in appearance.  The vagina is atrophic.The cervix is atrophic and stenotic at os.Pap with HPV performed.  Uterus is felt to be normal size, shape, and contour.  No  adnexal masses or tenderness noted. Rectal: Good sphincter tone, no polyps, or hemorrhoids felt.  Hemoccult negative. Extremities:  No swelling or varicosities noted Psych:  No mood changes,alert and cooperative,seems happy   Impression: Yearly gyn exam    Plan: Physical in 1 year Mammogram yearly  Labs with PCP Colonoscopy per GI

## 2014-01-16 NOTE — Patient Instructions (Signed)
Physical in  1 year Mammogram yearly Colonoscopy per GI  Labs with PCP 

## 2014-01-22 ENCOUNTER — Ambulatory Visit (INDEPENDENT_AMBULATORY_CARE_PROVIDER_SITE_OTHER): Payer: Medicare Other | Admitting: Internal Medicine

## 2014-01-22 ENCOUNTER — Encounter: Payer: Self-pay | Admitting: Internal Medicine

## 2014-01-22 ENCOUNTER — Telehealth: Payer: Self-pay | Admitting: Adult Health

## 2014-01-22 VITALS — BP 121/69 | HR 84 | Ht 62.0 in | Wt 131.1 lb

## 2014-01-22 DIAGNOSIS — I5022 Chronic systolic (congestive) heart failure: Secondary | ICD-10-CM

## 2014-01-22 DIAGNOSIS — Z950 Presence of cardiac pacemaker: Secondary | ICD-10-CM

## 2014-01-22 MED ORDER — FUROSEMIDE 40 MG PO TABS: 40.0000 mg | ORAL_TABLET | Freq: Two times a day (BID) | ORAL | Status: DC

## 2014-01-22 NOTE — Assessment & Plan Note (Signed)
She may have a stitch abscess. I have asked her to call us if she develops drainage at her incision line. No evidence of infection in the pocket itself.
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
## 2014-01-22 NOTE — Telephone Encounter (Signed)
Pt aware of negative pap but +HPV will repeat pap in 1 year

## 2014-01-22 NOTE — Patient Instructions (Addendum)
Your physician recommends that you schedule a follow-up appointment in: July with Dr Lovena Le.  Decreased Lasix to 40 mg twice a day

## 2014-01-22 NOTE — Assessment & Plan Note (Signed)
Her symptoms are improved. I have asked the patient to reduce her dose of lasix to 80 mg daily.

## 2014-02-20 ENCOUNTER — Ambulatory Visit (INDEPENDENT_AMBULATORY_CARE_PROVIDER_SITE_OTHER): Payer: Medicare Other | Admitting: Urology

## 2014-02-20 DIAGNOSIS — N312 Flaccid neuropathic bladder, not elsewhere classified: Secondary | ICD-10-CM

## 2014-04-09 ENCOUNTER — Telehealth: Payer: Self-pay | Admitting: *Deleted

## 2014-04-09 NOTE — Telephone Encounter (Signed)
Pt has lack of energy since getting device changeout. Pt concerned she cannot see Dr. Lovena Le in [place] for 6 months as told per scheduling. I double booked an appt here in [place] on 04/19/14. Pt also states her device has migrated towards her collar bone--annotated in appt notes.

## 2014-04-19 ENCOUNTER — Encounter: Payer: Self-pay | Admitting: Internal Medicine

## 2014-04-19 ENCOUNTER — Ambulatory Visit (INDEPENDENT_AMBULATORY_CARE_PROVIDER_SITE_OTHER): Payer: Medicare Other | Admitting: Internal Medicine

## 2014-04-19 VITALS — BP 108/68 | HR 95 | Ht 62.0 in | Wt 131.8 lb

## 2014-04-19 DIAGNOSIS — Z95810 Presence of automatic (implantable) cardiac defibrillator: Secondary | ICD-10-CM | POA: Insufficient documentation

## 2014-04-19 DIAGNOSIS — I5022 Chronic systolic (congestive) heart failure: Secondary | ICD-10-CM

## 2014-04-19 LAB — MDC_IDC_ENUM_SESS_TYPE_INCLINIC
Battery Remaining Longevity: 85 mo
Brady Statistic AP VP Percent: 0.03 %
Brady Statistic AS VP Percent: 97.89 %
Brady Statistic RA Percent Paced: 0.05 %
Brady Statistic RV Percent Paced: 23 %
Date Time Interrogation Session: 20150806143450
HighPow Impedance: 190 Ohm
HighPow Impedance: 74 Ohm
Lead Channel Impedance Value: 361 Ohm
Lead Channel Impedance Value: 475 Ohm
Lead Channel Impedance Value: 513 Ohm
Lead Channel Impedance Value: 608 Ohm
Lead Channel Impedance Value: 703 Ohm
Lead Channel Pacing Threshold Amplitude: 0.5 V
Lead Channel Pacing Threshold Pulse Width: 0.4 ms
Lead Channel Sensing Intrinsic Amplitude: 16.375 mV
Lead Channel Sensing Intrinsic Amplitude: 3.875 mV
Lead Channel Setting Pacing Amplitude: 2 V
Lead Channel Setting Pacing Amplitude: 2.5 V
Lead Channel Setting Pacing Pulse Width: 0.4 ms
Lead Channel Setting Pacing Pulse Width: 0.8 ms
Lead Channel Setting Sensing Sensitivity: 0.3 mV
MDC IDC MSMT BATTERY VOLTAGE: 2.99 V
MDC IDC MSMT LEADCHNL LV PACING THRESHOLD PULSEWIDTH: 0.8 ms
MDC IDC MSMT LEADCHNL RA PACING THRESHOLD AMPLITUDE: 0.625 V
MDC IDC MSMT LEADCHNL RA PACING THRESHOLD PULSEWIDTH: 0.4 ms
MDC IDC MSMT LEADCHNL RV PACING THRESHOLD AMPLITUDE: 0.75 V
MDC IDC SET LEADCHNL RA PACING AMPLITUDE: 2 V
MDC IDC SET ZONE DETECTION INTERVAL: 350 ms
MDC IDC SET ZONE DETECTION INTERVAL: 360 ms
MDC IDC STAT BRADY AP VS PERCENT: 0.02 %
MDC IDC STAT BRADY AS VS PERCENT: 2.05 %
Zone Setting Detection Interval: 300 ms
Zone Setting Detection Interval: 350 ms

## 2014-04-19 NOTE — Assessment & Plan Note (Signed)
Her symptoms are class 2. I have asked the patient to continue her current meds, continue a low sodium diet, and start back exercising regularly.

## 2014-04-19 NOTE — Assessment & Plan Note (Signed)
Her BiV ICD is working normally and her incision appears to be well healed. Will recheck in several months.
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
## 2014-04-19 NOTE — Patient Instructions (Addendum)
Your physician wants you to follow-up in: April 2016 with Dr Lovena Le in [place] will receive a reminder letter in the mail two months in advance. If you don't receive a letter, please call our office to schedule the follow-up appointment.   Remote monitoring is used to monitor your Pacemaker or ICD from home. This monitoring reduces the number of office visits required to check your device to one time per year. It allows [name] to keep an eye on the functioning of your device to ensure it is working properly. You are scheduled for a device check from home on 07/23/14. You may send your transmission at any time that day. If you have a wireless device, the transmission will be sent automatically. After your physician reviews your transmission, you will receive a postcard with your next transmission date.

## 2014-06-29 ENCOUNTER — Other Ambulatory Visit: Payer: Self-pay

## 2014-07-06 ENCOUNTER — Other Ambulatory Visit: Payer: Self-pay | Admitting: Obstetrics and Gynecology

## 2014-07-06 DIAGNOSIS — Z1231 Encounter for screening mammogram for malignant neoplasm of breast: Secondary | ICD-10-CM

## 2014-07-13 ENCOUNTER — Emergency Department (HOSPITAL_COMMUNITY)
Admission: EM | Admit: 2014-07-13 | Discharge: 2014-07-13 | Disposition: A | Discharge status: Home / Self Care | Payer: Medicare Other | Attending: Emergency Medicine | Admitting: Emergency Medicine

## 2014-07-13 ENCOUNTER — Encounter (HOSPITAL_COMMUNITY): Payer: Self-pay | Admitting: Emergency Medicine

## 2014-07-13 DIAGNOSIS — F329 Major depressive disorder, single episode, unspecified: Secondary | ICD-10-CM | POA: Diagnosis not present

## 2014-07-13 DIAGNOSIS — K219 Gastro-esophageal reflux disease without esophagitis: Secondary | ICD-10-CM | POA: Insufficient documentation

## 2014-07-13 DIAGNOSIS — R51 Headache: Secondary | ICD-10-CM | POA: Insufficient documentation

## 2014-07-13 DIAGNOSIS — Z7982 Long term (current) use of aspirin: Secondary | ICD-10-CM | POA: Insufficient documentation

## 2014-07-13 DIAGNOSIS — R112 Nausea with vomiting, unspecified: Secondary | ICD-10-CM | POA: Diagnosis present

## 2014-07-13 DIAGNOSIS — E119 Type 2 diabetes mellitus without complications: Secondary | ICD-10-CM | POA: Diagnosis not present

## 2014-07-13 DIAGNOSIS — F419 Anxiety disorder, unspecified: Secondary | ICD-10-CM | POA: Insufficient documentation

## 2014-07-13 DIAGNOSIS — R0602 Shortness of breath: Secondary | ICD-10-CM | POA: Diagnosis not present

## 2014-07-13 DIAGNOSIS — Z79899 Other long term (current) drug therapy: Secondary | ICD-10-CM | POA: Insufficient documentation

## 2014-07-13 DIAGNOSIS — E162 Hypoglycemia, unspecified: Secondary | ICD-10-CM

## 2014-07-13 DIAGNOSIS — I10 Essential (primary) hypertension: Secondary | ICD-10-CM | POA: Insufficient documentation

## 2014-07-13 DIAGNOSIS — N390 Urinary tract infection, site not specified: Secondary | ICD-10-CM

## 2014-07-13 DIAGNOSIS — I509 Heart failure, unspecified: Secondary | ICD-10-CM | POA: Insufficient documentation

## 2014-07-13 LAB — URINALYSIS, ROUTINE W REFLEX MICROSCOPIC
Bilirubin Urine: NEGATIVE
Glucose, UA: >1000 mg/dL — AB
Hgb urine dipstick: NEGATIVE
Ketones, ur: NEGATIVE mg/dL
Nitrite: POSITIVE — AB
Protein, ur: NEGATIVE mg/dL
Specific Gravity, Urine: <1.005 — ABNORMAL LOW (ref 1.005–1.030)
Urobilinogen, UA: 0.2 mg/dL (ref 0.0–1.0)
pH: 6 (ref 5.0–8.0)

## 2014-07-13 LAB — CBC WITH DIFFERENTIAL/PLATELET
Basophils Absolute: 0 10*3/uL (ref 0.0–0.1)
Basophils Relative: 0 % (ref 0–1)
Eosinophils Absolute: 0.2 10*3/uL (ref 0.0–0.7)
Eosinophils Relative: 2 % (ref 0–5)
HCT: 35.2 % — ABNORMAL LOW (ref 36.0–46.0)
Hemoglobin: 11.9 g/dL — ABNORMAL LOW (ref 12.0–15.0)
Lymphocytes Relative: 26 % (ref 12–46)
Lymphs Abs: 2.1 10*3/uL (ref 0.7–4.0)
MCH: 29.9 pg (ref 26.0–34.0)
MCHC: 33.8 g/dL (ref 30.0–36.0)
MCV: 88.4 fL (ref 78.0–100.0)
Monocytes Absolute: 0.7 10*3/uL (ref 0.1–1.0)
Monocytes Relative: 8 % (ref 3–12)
Neutro Abs: 5.1 10*3/uL (ref 1.7–7.7)
Neutrophils Relative %: 64 % (ref 43–77)
Platelets: 272 10*3/uL (ref 150–400)
RBC: 3.98 MIL/uL (ref 3.87–5.11)
RDW: 12.1 % (ref 11.5–15.5)
WBC: 8.1 10*3/uL (ref 4.0–10.5)

## 2014-07-13 LAB — BASIC METABOLIC PANEL
Anion gap: 15 (ref 5–15)
BUN: 14 mg/dL (ref 6–23)
CO2: 28 mEq/L (ref 19–32)
Calcium: 9.7 mg/dL (ref 8.4–10.5)
Chloride: 99 mEq/L (ref 96–112)
Creatinine, Ser: 1.04 mg/dL (ref 0.50–1.10)
GFR calc Af Amer: 66 mL/min — ABNORMAL LOW (ref >90)
GFR calc non Af Amer: 57 mL/min — ABNORMAL LOW (ref >90)
Glucose, Bld: 77 mg/dL (ref 70–99)
Potassium: 3.8 mEq/L (ref 3.7–5.3)
Sodium: 142 mEq/L (ref 137–147)

## 2014-07-13 LAB — CBG MONITORING, ED
Glucose-Capillary: 184 mg/dL — ABNORMAL HIGH (ref 70–99)
Glucose-Capillary: 37 mg/dL — CL (ref 70–99)

## 2014-07-13 LAB — URINE MICROSCOPIC-ADD ON

## 2014-07-13 MED ORDER — ONDANSETRON HCL 4 MG/2ML IJ SOLN
4.0000 mg | Freq: Once | INTRAMUSCULAR | Status: AC
  Administered 2014-07-13: 4 mg via INTRAMUSCULAR
  Filled 2014-07-13: qty 2

## 2014-07-13 MED ORDER — SODIUM CHLORIDE 0.9 % IV BOLUS (SEPSIS)
1000.0000 mL | Freq: Once | INTRAVENOUS | Status: AC
  Administered 2014-07-13: 1000 mL via INTRAVENOUS

## 2014-07-13 MED ORDER — DEXTROSE 50 % IV SOLN
INTRAVENOUS | Status: AC
  Filled 2014-07-13: qty 50

## 2014-07-13 MED ORDER — DEXTROSE 50 % IV SOLN
1.0000 | Freq: Once | INTRAVENOUS | Status: AC
  Administered 2014-07-13: 50 mL via INTRAVENOUS

## 2014-07-13 MED ORDER — DEXTROSE 5 % IV SOLN
1.0000 g | Freq: Once | INTRAVENOUS | Status: AC
  Administered 2014-07-13: 1 g via INTRAVENOUS
  Filled 2014-07-13 (×2): qty 10

## 2014-07-13 MED ORDER — CEPHALEXIN 500 MG PO CAPS: 500.0000 mg | ORAL_CAPSULE | Freq: Four times a day (QID) | ORAL | Status: DC

## 2014-07-13 MED ORDER — ONDANSETRON HCL 4 MG PO TABS: 4.0000 mg | ORAL_TABLET | Freq: Four times a day (QID) | ORAL | Status: DC

## 2014-07-13 NOTE — ED Notes (Signed)
Pt states her CBG was 40 at home. States she ate 3 glucose tabs, and drank OJ waith sugar

## 2014-07-13 NOTE — Discharge Instructions (Signed)
Urinary Tract Infection Urinary tract infections (UTIs) can develop anywhere along your urinary tract. Your urinary tract is your body's drainage system for removing wastes and extra water. Your urinary tract includes two kidneys, two ureters, a bladder, and a urethra. Your kidneys are a pair of bean-shaped organs. Each kidney is about the size of your fist. They are located below your ribs, one on each side of your spine. CAUSES Infections are caused by microbes, which are microscopic organisms, including fungi, viruses, and bacteria. These organisms are so small that they can only be seen through a microscope. Bacteria are the microbes that most commonly cause UTIs. SYMPTOMS  Symptoms of UTIs may vary by age and gender of the patient and by the location of the infection. Symptoms in young women typically include a frequent and intense urge to urinate and a painful, burning feeling in the bladder or urethra during urination. Older women and men are more likely to be tired, shaky, and weak and have muscle aches and abdominal pain. A fever may mean the infection is in your kidneys. Other symptoms of a kidney infection include pain in your back or sides below the ribs, nausea, and vomiting. DIAGNOSIS To diagnose a UTI, your caregiver will ask you about your symptoms. Your caregiver also will ask to provide a urine sample. The urine sample will be tested for bacteria and white blood cells. White blood cells are made by your body to help fight infection. TREATMENT  Typically, UTIs can be treated with medication. Because most UTIs are caused by a bacterial infection, they usually can be treated with the use of antibiotics. The choice of antibiotic and length of treatment depend on your symptoms and the type of bacteria causing your infection. HOME CARE INSTRUCTIONS  If you were prescribed antibiotics, take them exactly as your caregiver instructs you. Finish the medication even if you feel better after you  have only taken some of the medication.  Drink enough water and fluids to keep your urine clear or pale yellow.  Avoid caffeine, tea, and carbonated beverages. They tend to irritate your bladder.  Empty your bladder often. Avoid holding urine for long periods of time.  Empty your bladder before and after sexual intercourse.  After a bowel movement, women should cleanse from front to back. Use each tissue only once. SEEK MEDICAL CARE IF:   You have back pain.  You develop a fever.  Your symptoms do not begin to resolve within 3 days. SEEK IMMEDIATE MEDICAL CARE IF:   You have severe back pain or lower abdominal pain.  You develop chills.  You have nausea or vomiting.  You have continued burning or discomfort with urination. MAKE SURE YOU:   Understand these instructions.  Will watch your condition.  Will get help right away if you are not doing well or get worse. Document Released: 06/10/2005 Document Revised: 03/01/2012 Document Reviewed: 10/09/2011 [facility] Patient Information 2015 [place], [place]. This information is not intended to replace advice given to you by your health care provider. Make sure you discuss any questions you have with your health care provider.  Nausea, Adult Nausea is the feeling that you have an upset stomach or have to vomit. Nausea by itself is not likely a serious concern, but it may be an early sign of more serious medical problems. As nausea gets worse, it can lead to vomiting. If vomiting develops, there is the risk of dehydration.  CAUSES   Viral infections.  Food poisoning.  Medicines.  Pregnancy.  Motion sickness.  Migraine headaches.  Emotional distress.  Severe pain from any source.  Alcohol intoxication. HOME CARE INSTRUCTIONS  Get plenty of rest.  Ask your caregiver about specific rehydration instructions.  Eat small amounts of food and sip liquids more often.  Take all medicines as told by your caregiver. SEEK  MEDICAL CARE IF:  You have not improved after 2 days, or you get worse.  You have a headache. SEEK IMMEDIATE MEDICAL CARE IF:   You have a fever.  You faint.  You keep vomiting or have blood in your vomit.  You are extremely weak or dehydrated.  You have dark or bloody stools.  You have severe chest or abdominal pain. MAKE SURE YOU:  Understand these instructions.  Will watch your condition.  Will get help right away if you are not doing well or get worse. Document Released: 10/08/2004 Document Revised: 05/25/2012 Document Reviewed: 05/13/2011 [facility] Patient Information 2015 [place], [place]. This information is not intended to replace advice given to you by your health care provider. Make sure you discuss any questions you have with your health care provider.

## 2014-07-13 NOTE — ED Notes (Signed)
Pt states she has has nausea and vomiting for three days.

## 2014-07-13 NOTE — ED Provider Notes (Signed)
CSN: [id number]     Arrival date & time 07/13/14  1615 History  This chart was scribed for Virgel Manifold, MD by Tula Nakayama, ED Scribe. This patient was seen in room APA11/APA11 and the patient's care was started at 6:00 PM.     Chief Complaint  Patient presents with  . Emesis   The history is provided by the patient. No language interpreter was used.   HPI Comments: [name] is a 62 y.o. female with a history of DM, who presents to the Emergency Department complaining of intermittent nausea and vomiting that started a few days ago and occurs after eating. Pt states gradually worsening, intermittent dizziness, constant headache and mild SOB that occurred earlier today as associated symptoms. She notes that she has not eaten in 3 days and that her blood sugar measured 40 earlier today. She has not tried any treatment at home for current symptoms and denies positive sick contact. Pt states she was put on defibrillator 5 months ago because one of the leads of her pacemaker became loose from vein. She notes that legs have been tingling lately which has made walking difficult. Pt denies fever, abdominal pain, diarrhea, leg swelling and leg pain.   Past Medical History  Diagnosis Date  . Chronic constipation   . Cardiomyopathy   . HTN (hypertension)   . Heart failure     Chronic combined  . Hypercholesteremia   . GERD (gastroesophageal reflux disease)   . Depression   . Neurogenic bladder     2008 nerve damage s/p back ssurgery  . Type II diabetes mellitus   . Chronic lower back pain   . Anxiety    Past Surgical History  Procedure Laterality Date  . Cesarean section  1981  . Back surgery  2005  . 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 Upper gastrointestinal endoscopy  MAY 2011 w/ PROP    MILD GASTRITIS 2o ETOH  . Cholecystectomy    . Achilles tendon surgery Right 06/2012  . Gastrocnemius recession  06/24/2012    Procedure: GASTROCNEMIUS SLIDE;   Surgeon: Colin Rhein, MD;  Location: [facility];  Service: Orthopedics;  Laterality: Right;  . Bi-ventricular pacemaker upgrade  12/18/2013    w/defibrillator  . Tubal ligation  ~ 1984  . Breast biopsy Left 1973    benign  . Breast lumpectomy Left 1973  . Posterior lumbar fusion  2006; 2008  . Pacemaker insertion  2006    Dual chamber MDT pacemaker implanted - subsequent upgrade to CRTD 12-2013  . Hernia repair    . Bi-ventricular implantable cardioverter defibrillator  (crt-d)  12-18-2013    upgrade of previously implanted dual chamber pacemaker to MDT CRTD with RV lead revision by Dr Lovena Le   Family History  Problem Relation Age of Onset  . Colon cancer Neg Hx   . Colon polyps Neg Hx   . Alzheimer's disease Mother   . Diabetes Mother   . Cancer Father   . Cancer Sister     breast  . Diabetes Sister   . Diabetes Brother    History  Substance Use Topics  . Smoking status: Never Smoker   . Smokeless tobacco: Never Used  . Alcohol Use: No     Comment: 12/18/2013 "1 bottle of wine per week" NONE in 3 months 01/16/14   OB History   Grav Para Term Preterm Abortions TAB SAB Ect Mult Living   1 1  Review of Systems  Constitutional: Negative for fever.  Respiratory: Positive for shortness of breath.   Cardiovascular: Negative for leg swelling.  Gastrointestinal: Positive for nausea and vomiting. Negative for abdominal pain and diarrhea.  Musculoskeletal: Negative for arthralgias.  Neurological: Positive for dizziness and headaches.  All other systems reviewed and are negative.     Allergies  Review of patient's allergies indicates no known allergies.  Home Medications   Prior to Admission medications   Medication Sig Start Date End Date Taking? Authorizing Provider  aspirin EC 81 MG tablet Take 81 mg by mouth daily.   Yes Historical Provider, MD  atorvastatin (LIPITOR) 40 MG tablet Take 40 mg by mouth every morning.    Yes Historical Provider, MD  buPROPion  (WELLBUTRIN XL) 150 MG 24 hr tablet Take 150 mg by mouth every morning.   Yes Historical Provider, MD  BYDUREON 2 MG SUSR Inject 2 mg into the skin every 7 (seven) days.  05/22/14  Yes Historical Provider, MD  carvedilol (COREG) 6.25 MG tablet Take 1 tablet (6.25 mg total) by mouth 2 (two) times daily with a meal. 06/15/13 07/13/14 Yes Evans Lance, MD  clonazePAM (KLONOPIN) 0.5 MG tablet Take 1 mg by mouth daily as needed for anxiety.    Yes Historical Provider, MD  Cyanocobalamin (B-12 PO) Take 1 tablet by mouth daily.   Yes Historical Provider, MD  Dapagliflozin Propanediol (FARXIGA) 10 MG TABS Take 10 mg by mouth daily.   Yes Historical Provider, MD  digoxin (LANOXIN) 0.125 MG tablet Take 1 tablet (125 mcg total) by mouth daily. 06/14/12  Yes Evans Lance, MD  furosemide (LASIX) 40 MG tablet Take 80 mg by mouth daily.   Yes Historical Provider, MD  glipiZIDE (GLUCOTROL) 5 MG tablet Take 10 mg by mouth 2 (two) times daily before a meal.    Yes Historical Provider, MD  HYDROcodone-acetaminophen (NORCO) 10-325 MG per tablet Take 1 tablet by mouth 4 (four) times daily as needed for moderate pain.  05/29/13  Yes Historical Provider, MD  losartan (COZAAR) 100 MG tablet Take 100 mg by mouth every morning.  05/22/13  Yes Historical Provider, MD  metFORMIN (GLUCOPHAGE) 500 MG tablet Take 1,000 mg by mouth 2 (two) times daily with a meal.    Yes Historical Provider, MD  Multiple Vitamin (MULTIVITAMIN WITH MINERALS) TABS tablet Take 1 tablet by mouth daily.   Yes Historical Provider, MD  omeprazole (PRILOSEC) 20 MG capsule Take 20 mg by mouth 2 (two) times daily.    Yes Historical Provider, MD  potassium chloride SA (K-DUR,KLOR-CON) 20 MEQ tablet Take 20 mEq by mouth 2 (two) times daily.    Yes Historical Provider, MD  venlafaxine (EFFEXOR-XR) 150 MG 24 hr capsule Take 300 mg by mouth every morning.    Yes Historical Provider, MD  zolpidem (AMBIEN) 10 MG tablet Take 20 mg by mouth at bedtime.    Yes Historical  Provider, MD   BP 130/72  Pulse 91  Temp(Src) 97.6 F (36.4 C) (Oral)  Resp 18  Ht 5\' 2"  (1.575 m)  Wt 136 lb (61.689 kg)  BMI 24.87 kg/m2  SpO2 100% Physical Exam  Nursing note and vitals reviewed. Constitutional: She is oriented to person, place, and time. She appears well-developed and well-nourished. No distress.  HENT:  Head: Normocephalic and atraumatic.  Eyes: EOM are normal.  Neck: Normal range of motion.  Cardiovascular: Normal rate, regular rhythm and normal heart sounds.   Pulmonary/Chest: Effort normal and breath  sounds normal.  Abdominal: Soft. She exhibits no distension. There is no tenderness.  Musculoskeletal: Normal range of motion.  Neurological: She is alert and oriented to person, place, and time.  Skin: Skin is warm and dry.  Psychiatric: She has a normal mood and affect. Judgment normal.    ED Course  Procedures (including critical care time)  DIAGNOSTIC STUDIES: Oxygen Saturation is 100% on RA, normal by my interpretation.    COORDINATION OF CARE: 6:07 PM Discussed treatment plan with pt at bedside and pt agreed to plan.   Labs Review Labs Reviewed - No data to display  Imaging Review No results found.   EKG Interpretation None      MDM   Final diagnoses:  UTI (lower urinary tract infection)  Non-intractable vomiting with nausea, vomiting of unspecified type  Hypoglycemia    63 year old female with nausea and vomiting. Benign abdominal exam. Suspect symptoms may be at least somewhat related to urinary tract infection. She is afebrile. Started on antibiotics. Also noted to be hypoglycemic. Most likely related to poor by mouth intake in the setting of continued use of diabetic medication. Stabilize prior to discharge. Symptoms improved. Hopefully her nausea and vomiting or improve as infection is treated. As needed Zofran. Discussed with patient that she should defer taking her diabetic medications for the next couple days if he she  continues to have significantly decreased by mouth intake. Return precautions and follow-up instructions discussed.  I personally preformed the services scribed in my presence. The recorded information has been reviewed is accurate. Virgel Manifold, MD.     Virgel Manifold, MD 07/19/14 [phone]

## 2014-07-16 ENCOUNTER — Encounter (HOSPITAL_COMMUNITY): Payer: Self-pay | Admitting: Emergency Medicine

## 2014-07-17 LAB — URINE CULTURE
Colony Count: >=100000
Special Requests: NORMAL

## 2014-07-18 ENCOUNTER — Telehealth (HOSPITAL_BASED_OUTPATIENT_CLINIC_OR_DEPARTMENT_OTHER): Payer: Self-pay | Admitting: Emergency Medicine

## 2014-07-18 NOTE — Telephone Encounter (Signed)
Post ED Visit - Positive Culture Follow-up  Culture report reviewed by antimicrobial stewardship pharmacist: []  Wes [place], Pharm.D., BCPS []  Heide Guile, Pharm.D., BCPS []  Alycia Rossetti, Pharm.D., BCPS []  [place], [place].D., BCPS, AAHIVP []  Legrand Como, Pharm.D., BCPS, AAHIVP [x]  Elicia Lamp, Pharm.D.   Positive urine culture  >100,000 colonies E. Coli Treated with cephalexin, organism sensitive to the same and no further patient follow-up is required at this time.  Hazle Nordmann 07/18/2014, 10:37 AM

## 2014-07-23 ENCOUNTER — Encounter: Payer: Self-pay | Admitting: Internal Medicine

## 2014-07-23 ENCOUNTER — Ambulatory Visit (INDEPENDENT_AMBULATORY_CARE_PROVIDER_SITE_OTHER): Payer: Medicare Other | Admitting: *Deleted

## 2014-07-23 DIAGNOSIS — I5022 Chronic systolic (congestive) heart failure: Secondary | ICD-10-CM

## 2014-07-23 DIAGNOSIS — I429 Cardiomyopathy, unspecified: Secondary | ICD-10-CM

## 2014-07-23 NOTE — Progress Notes (Signed)
Remote ICD transmission.   

## 2014-07-25 LAB — MDC_IDC_ENUM_SESS_TYPE_REMOTE
Battery Voltage: 2.99 V
Brady Statistic AP VP Percent: 0 %
Brady Statistic AP VS Percent: 0.01 %
Brady Statistic AS VP Percent: 94.99 %
Brady Statistic AS VS Percent: 5 %
Brady Statistic RA Percent Paced: 0.01 %
Brady Statistic RV Percent Paced: 28.46 %
HighPow Impedance: 75 Ohm
Lead Channel Impedance Value: 361 Ohm
Lead Channel Impedance Value: 475 Ohm
Lead Channel Impedance Value: 551 Ohm
Lead Channel Impedance Value: 608 Ohm
Lead Channel Impedance Value: 817 Ohm
Lead Channel Pacing Threshold Amplitude: 0.5 V
Lead Channel Pacing Threshold Amplitude: 0.75 V
Lead Channel Pacing Threshold Amplitude: 1.375 V
Lead Channel Pacing Threshold Pulse Width: 0.4 ms
Lead Channel Pacing Threshold Pulse Width: 0.8 ms
Lead Channel Sensing Intrinsic Amplitude: 25.75 mV
Lead Channel Setting Pacing Amplitude: 2 V
Lead Channel Setting Pacing Amplitude: 2.5 V
Lead Channel Setting Pacing Pulse Width: 0.4 ms
Lead Channel Setting Sensing Sensitivity: 0.3 mV
MDC IDC MSMT BATTERY REMAINING LONGEVITY: 77 mo
MDC IDC MSMT LEADCHNL RA SENSING INTR AMPL: 3.125 mV
MDC IDC MSMT LEADCHNL RV IMPEDANCE VALUE: 418 Ohm
MDC IDC MSMT LEADCHNL RV PACING THRESHOLD PULSEWIDTH: 0.4 ms
MDC IDC SESS DTM: 20151109143856
MDC IDC SET LEADCHNL LV PACING AMPLITUDE: 2.5 V
MDC IDC SET LEADCHNL LV PACING PULSEWIDTH: 0.8 ms
MDC IDC SET ZONE DETECTION INTERVAL: 300 ms
MDC IDC SET ZONE DETECTION INTERVAL: 360 ms
Zone Setting Detection Interval: 350 ms
Zone Setting Detection Interval: 350 ms

## 2014-07-30 ENCOUNTER — Ambulatory Visit (HOSPITAL_COMMUNITY)
Admission: RE | Admit: 2014-07-30 | Discharge: 2014-07-30 | Disposition: A | Discharge status: Home / Self Care | Payer: Medicare Other | Source: Ambulatory Visit | Attending: Obstetrics and Gynecology | Admitting: Obstetrics and Gynecology

## 2014-07-30 DIAGNOSIS — Z1231 Encounter for screening mammogram for malignant neoplasm of breast: Secondary | ICD-10-CM | POA: Insufficient documentation

## 2014-08-01 ENCOUNTER — Encounter: Payer: Self-pay | Admitting: Cardiology

## 2014-08-07 ENCOUNTER — Telehealth: Payer: Self-pay | Admitting: Gastroenterology

## 2014-08-07 NOTE — Telephone Encounter (Signed)
I talked to the pt. She said she had been on 4 medications for diabetes and Dr. Luan Pulling referred her to Dr. Dorris Fetch. He took her off 3 of the diabetic meds and only left her on Metformin. She got very sick from that and was in bed for 2 weeks. Said she has been very sick on her stomach. I told her that we have not seen in in several years and we cannot make recommendations over the phone for her.( Last OV looks like was 02/04/2011).  I told her to see her to see her PCP or Dr. Dorris Fetch about her diabetic meds since she has not been back to him after the change in meds. I told her if PCP or Dr. Dorris Fetch thinks she needs GI appt, we will be happy to make one for her.

## 2014-08-07 NOTE — Telephone Encounter (Signed)
Pt called to make OV with [name]. She has been vomiting for 2 weeks, stays nauseated all the time and phenergan isn't helping her. She says that she's lost 17 pounds in 2 months. Our next available is in January and suggested that maybe she should follow up with her PCP. DS is speaking with patient now.

## 2014-08-08 NOTE — Telephone Encounter (Signed)
Agree 

## 2014-08-13 ENCOUNTER — Emergency Department (HOSPITAL_COMMUNITY): Payer: Medicare Other

## 2014-08-13 ENCOUNTER — Ambulatory Visit: Payer: Medicare Other | Admitting: Nutrition

## 2014-08-13 ENCOUNTER — Encounter (HOSPITAL_COMMUNITY): Payer: Self-pay | Admitting: *Deleted

## 2014-08-13 ENCOUNTER — Emergency Department (HOSPITAL_COMMUNITY)
Admission: EM | Admit: 2014-08-13 | Discharge: 2014-08-13 | Disposition: A | Discharge status: Home / Self Care | Payer: Medicare Other | Attending: Emergency Medicine | Admitting: Emergency Medicine

## 2014-08-13 DIAGNOSIS — I10 Essential (primary) hypertension: Secondary | ICD-10-CM | POA: Insufficient documentation

## 2014-08-13 DIAGNOSIS — E780 Pure hypercholesterolemia: Secondary | ICD-10-CM | POA: Insufficient documentation

## 2014-08-13 DIAGNOSIS — R000 Tachycardia, unspecified: Secondary | ICD-10-CM | POA: Insufficient documentation

## 2014-08-13 DIAGNOSIS — G8929 Other chronic pain: Secondary | ICD-10-CM | POA: Diagnosis not present

## 2014-08-13 DIAGNOSIS — R6883 Chills (without fever): Secondary | ICD-10-CM | POA: Insufficient documentation

## 2014-08-13 DIAGNOSIS — R600 Localized edema: Secondary | ICD-10-CM | POA: Insufficient documentation

## 2014-08-13 DIAGNOSIS — Z950 Presence of cardiac pacemaker: Secondary | ICD-10-CM | POA: Insufficient documentation

## 2014-08-13 DIAGNOSIS — F329 Major depressive disorder, single episode, unspecified: Secondary | ICD-10-CM | POA: Insufficient documentation

## 2014-08-13 DIAGNOSIS — Z79899 Other long term (current) drug therapy: Secondary | ICD-10-CM | POA: Insufficient documentation

## 2014-08-13 DIAGNOSIS — N390 Urinary tract infection, site not specified: Secondary | ICD-10-CM | POA: Diagnosis not present

## 2014-08-13 DIAGNOSIS — R51 Headache: Secondary | ICD-10-CM | POA: Diagnosis not present

## 2014-08-13 DIAGNOSIS — Z792 Long term (current) use of antibiotics: Secondary | ICD-10-CM | POA: Diagnosis not present

## 2014-08-13 DIAGNOSIS — R112 Nausea with vomiting, unspecified: Secondary | ICD-10-CM | POA: Diagnosis not present

## 2014-08-13 DIAGNOSIS — F419 Anxiety disorder, unspecified: Secondary | ICD-10-CM | POA: Insufficient documentation

## 2014-08-13 DIAGNOSIS — E119 Type 2 diabetes mellitus without complications: Secondary | ICD-10-CM | POA: Diagnosis not present

## 2014-08-13 DIAGNOSIS — Z7982 Long term (current) use of aspirin: Secondary | ICD-10-CM | POA: Diagnosis not present

## 2014-08-13 DIAGNOSIS — K219 Gastro-esophageal reflux disease without esophagitis: Secondary | ICD-10-CM | POA: Diagnosis not present

## 2014-08-13 DIAGNOSIS — R109 Unspecified abdominal pain: Secondary | ICD-10-CM

## 2014-08-13 LAB — COMPREHENSIVE METABOLIC PANEL
ALK PHOS: 96 U/L (ref 39–117)
ALT: 54 U/L — ABNORMAL HIGH (ref 0–35)
AST: 37 U/L (ref 0–37)
Albumin: 4.4 g/dL (ref 3.5–5.2)
Anion gap: 16 — ABNORMAL HIGH (ref 5–15)
BUN: 17 mg/dL (ref 6–23)
CO2: 28 mEq/L (ref 19–32)
Calcium: 9.6 mg/dL (ref 8.4–10.5)
Chloride: 95 mEq/L — ABNORMAL LOW (ref 96–112)
Creatinine, Ser: 1.21 mg/dL — ABNORMAL HIGH (ref 0.50–1.10)
GFR, EST AFRICAN AMERICAN: 55 mL/min — AB (ref >90)
GFR, EST NON AFRICAN AMERICAN: 47 mL/min — AB (ref >90)
GLUCOSE: 228 mg/dL — AB (ref 70–99)
POTASSIUM: 4.8 meq/L (ref 3.7–5.3)
Sodium: 139 mEq/L (ref 137–147)
Total Bilirubin: 0.6 mg/dL (ref 0.3–1.2)
Total Protein: 7.9 g/dL (ref 6.0–8.3)

## 2014-08-13 LAB — URINALYSIS, ROUTINE W REFLEX MICROSCOPIC
Bilirubin Urine: NEGATIVE
Glucose, UA: NEGATIVE mg/dL
Hgb urine dipstick: NEGATIVE
Ketones, ur: NEGATIVE mg/dL
Nitrite: NEGATIVE
PH: 6 (ref 5.0–8.0)
Protein, ur: NEGATIVE mg/dL
Specific Gravity, Urine: 1.015 (ref 1.005–1.030)
Urobilinogen, UA: 0.2 mg/dL (ref 0.0–1.0)

## 2014-08-13 LAB — CBC WITH DIFFERENTIAL/PLATELET
Basophils Absolute: 0 10*3/uL (ref 0.0–0.1)
Basophils Relative: 1 % (ref 0–1)
EOS PCT: 2 % (ref 0–5)
Eosinophils Absolute: 0.1 10*3/uL (ref 0.0–0.7)
HCT: 40.3 % (ref 36.0–46.0)
HEMOGLOBIN: 13.4 g/dL (ref 12.0–15.0)
LYMPHS ABS: 2.3 10*3/uL (ref 0.7–4.0)
Lymphocytes Relative: 36 % (ref 12–46)
MCH: 30.5 pg (ref 26.0–34.0)
MCHC: 33.3 g/dL (ref 30.0–36.0)
MCV: 91.6 fL (ref 78.0–100.0)
MONOS PCT: 9 % (ref 3–12)
Monocytes Absolute: 0.6 10*3/uL (ref 0.1–1.0)
NEUTROS PCT: 54 % (ref 43–77)
Neutro Abs: 3.6 10*3/uL (ref 1.7–7.7)
Platelets: 341 10*3/uL (ref 150–400)
RBC: 4.4 MIL/uL (ref 3.87–5.11)
RDW: 12.6 % (ref 11.5–15.5)
WBC: 6.6 10*3/uL (ref 4.0–10.5)

## 2014-08-13 LAB — LIPASE, BLOOD: Lipase: 29 U/L (ref 11–59)

## 2014-08-13 LAB — CBG MONITORING, ED: Glucose-Capillary: 135 mg/dL — ABNORMAL HIGH (ref 70–99)

## 2014-08-13 LAB — URINE MICROSCOPIC-ADD ON

## 2014-08-13 MED ORDER — PROMETHAZINE HCL 25 MG PO TABS: 25.0000 mg | ORAL_TABLET | Freq: Four times a day (QID) | ORAL | Status: DC | PRN

## 2014-08-13 MED ORDER — IOHEXOL 300 MG/ML  SOLN: 25.0000 mL | Freq: Once | INTRAMUSCULAR | Status: DC | PRN

## 2014-08-13 MED ORDER — SODIUM CHLORIDE 0.9 % IV SOLN
INTRAVENOUS | Status: DC
  Administered 2014-08-13: 16:00:00 via INTRAVENOUS

## 2014-08-13 MED ORDER — ONDANSETRON 4 MG PO TBDP: 4.0000 mg | ORAL_TABLET | Freq: Three times a day (TID) | ORAL | Status: DC | PRN

## 2014-08-13 MED ORDER — HYDROCODONE-ACETAMINOPHEN 5-325 MG PO TABS
1.0000 | ORAL_TABLET | Freq: Four times a day (QID) | ORAL | Status: DC | PRN
Start: 2014-08-13 — End: 2016-09-18

## 2014-08-13 MED ORDER — ONDANSETRON HCL 4 MG/2ML IJ SOLN
4.0000 mg | Freq: Once | INTRAMUSCULAR | Status: AC
  Administered 2014-08-13: 4 mg via INTRAVENOUS
  Filled 2014-08-13: qty 2

## 2014-08-13 MED ORDER — CEPHALEXIN 500 MG PO CAPS: 500.0000 mg | ORAL_CAPSULE | Freq: Four times a day (QID) | ORAL | Status: DC

## 2014-08-13 MED ORDER — DEXTROSE 5 % IV SOLN
1.0000 g | Freq: Once | INTRAVENOUS | Status: AC
  Administered 2014-08-13: 1 g via INTRAVENOUS
  Filled 2014-08-13: qty 10

## 2014-08-13 MED ORDER — CEFTRIAXONE SODIUM 1 G IJ SOLR
INTRAMUSCULAR | Status: AC
  Filled 2014-08-13: qty 10

## 2014-08-13 MED ORDER — HYDROMORPHONE HCL 1 MG/ML IJ SOLN
1.0000 mg | Freq: Once | INTRAMUSCULAR | Status: AC
  Administered 2014-08-13: 1 mg via INTRAVENOUS
  Filled 2014-08-13: qty 1

## 2014-08-13 MED ORDER — IOHEXOL 300 MG/ML  SOLN
100.0000 mL | Freq: Once | INTRAMUSCULAR | Status: AC | PRN
  Administered 2014-08-13: 100 mL via INTRAVENOUS

## 2014-08-13 MED ORDER — SODIUM CHLORIDE 0.9 % IV BOLUS (SEPSIS)
1000.0000 mL | Freq: Once | INTRAVENOUS | Status: AC
  Administered 2014-08-13: 1000 mL via INTRAVENOUS

## 2014-08-13 NOTE — ED Notes (Signed)
Pt states, " I have been throwing up bile for a month." Pt states pain to left flank, wrapping around back. Pain since Saturday.

## 2014-08-13 NOTE — ED Provider Notes (Signed)
CSN: [id number]     Arrival date & time 08/13/14  [id number] History  This chart was scribed for Fredia Sorrow, MD by Stephania Fragmin, ED Scribe. This patient was seen in room APA12/APA12 and the patient's care was started at 2:22 PM.    Chief Complaint  Patient presents with  . Flank Pain   Patient is a 63 y.o. female presenting with flank pain. The history is provided by the patient. No language interpreter was used.  Flank Pain This is a new problem. The current episode started 2 days ago. The problem occurs constantly. Associated symptoms include headaches (pt states that she gets them whenever her sugar is low, since she is diabetic) and shortness of breath. Pertinent negatives include no chest pain and no abdominal pain. Exacerbated by: none noted. Relieved by: none noted.     HPI Comments: [name] is a 63 y.o. female who presents to the Emergency Department complaining of 8/10 sharp, left lower back pain, as well as emesis that began 2 months ago. She reports associated emesis (reporting that she throws up bile), dehydration, anorexia, and weight loss of about 20 pounds. She has thrown up twice today. Patient reports that she had been seen here in October and was not given a clear diagnosis. She had blood tests but no CAT scan. Movement exacerbates her pain. Patient states that she has a heart defibrillator/pacemaker that has not yet fired. Dr. Luan Pulling is her PCP. Dr. Lovena Le is her cardiologist.    Past Medical History  Diagnosis Date  . Chronic constipation   . Cardiomyopathy   . HTN (hypertension)   . Heart failure     Chronic combined  . Hypercholesteremia   . GERD (gastroesophageal reflux disease)   . Depression   . Neurogenic bladder     2008 nerve damage s/p back ssurgery  . Type II diabetes mellitus   . Chronic lower back pain   . Anxiety    Past Surgical History  Procedure Laterality Date  . Cesarean section  1981  . Back surgery  2005  . Sigmoidoscopy  MAY 2011 w/  PROP    iTCS-->FSIG-poor bowel prep  . Upper gastrointestinal endoscopy  MAY 2011 w/ PROP    MILD GASTRITIS 2o ETOH  . Cholecystectomy    . Achilles tendon surgery Right 06/2012  . Gastrocnemius recession  06/24/2012    Procedure: GASTROCNEMIUS SLIDE;  Surgeon: Colin Rhein, MD;  Location: [facility];  Service: Orthopedics;  Laterality: Right;  . Bi-ventricular pacemaker upgrade  12/18/2013    w/defibrillator  . Tubal ligation  ~ 1984  . Breast biopsy Left 1973    benign  . Breast lumpectomy Left 1973  . Posterior lumbar fusion  2006; 2008  . Pacemaker insertion  2006    Dual chamber MDT pacemaker implanted - subsequent upgrade to CRTD 12-2013  . Hernia repair    . Bi-ventricular implantable cardioverter defibrillator  (crt-d)  12-18-2013    upgrade of previously implanted dual chamber pacemaker to MDT CRTD with RV lead revision by Dr Lovena Le   Family History  Problem Relation Age of Onset  . Colon cancer Neg Hx   . Colon polyps Neg Hx   . Alzheimer's disease Mother   . Diabetes Mother   . Cancer Father   . Cancer Sister     breast  . Diabetes Sister   . Diabetes Brother    History  Substance Use Topics  . Smoking status: Never Smoker   .  Smokeless tobacco: Never Used  . Alcohol Use: No   OB History    Gravida Para Term Preterm AB TAB SAB Ectopic Multiple Living   1 1             Review of Systems  Constitutional: Positive for chills. Negative for fever.  HENT: Negative for congestion, rhinorrhea and sore throat.   Eyes: Negative for visual disturbance.  Respiratory: Positive for shortness of breath. Negative for cough and chest tightness.   Cardiovascular: Positive for leg swelling (ankles). Negative for chest pain.  Gastrointestinal: Positive for nausea and vomiting. Negative for abdominal pain and diarrhea.  Genitourinary: Positive for flank pain. Negative for dysuria.  Musculoskeletal: Positive for back pain and joint swelling.  Skin: Negative for rash.   Neurological: Positive for headaches (pt states that she gets them whenever her sugar is low, since she is diabetic).  Hematological: Does not bruise/bleed easily.  Psychiatric/Behavioral: Negative for confusion.      Allergies  Review of patient's allergies indicates no known allergies.  Home Medications   Prior to Admission medications   Medication Sig Start Date End Date Taking? Authorizing Provider  Artificial Tear Solution (SYSTANE CONTACTS OP) Place 1 drop into both eyes 2 (two) times daily.   Yes Historical Provider, MD  aspirin EC 81 MG tablet Take 81 mg by mouth daily.   Yes Historical Provider, MD  atorvastatin (LIPITOR) 40 MG tablet Take 40 mg by mouth every morning.    Yes Historical Provider, MD  buPROPion (WELLBUTRIN XL) 150 MG 24 hr tablet Take 150 mg by mouth every morning.   Yes Historical Provider, MD  carvedilol (COREG) 6.25 MG tablet Take 1 tablet (6.25 mg total) by mouth 2 (two) times daily with a meal. 06/15/13 08/13/14 Yes Evans Lance, MD  clonazePAM (KLONOPIN) 0.5 MG tablet Take 1 mg by mouth daily as needed for anxiety.    Yes Historical Provider, MD  Cyanocobalamin (B-12 PO) Take 1 tablet by mouth daily.   Yes Historical Provider, MD  digoxin (LANOXIN) 0.125 MG tablet Take 1 tablet (125 mcg total) by mouth daily. 06/14/12  Yes Evans Lance, MD  furosemide (LASIX) 40 MG tablet Take 80 mg by mouth daily.   Yes Historical Provider, MD  losartan (COZAAR) 100 MG tablet Take 100 mg by mouth every morning.  05/22/13  Yes Historical Provider, MD  metFORMIN (GLUCOPHAGE) 500 MG tablet Take 1,000 mg by mouth 2 (two) times daily with a meal.    Yes Historical Provider, MD  Multiple Vitamin (MULTIVITAMIN WITH MINERALS) TABS tablet Take 1 tablet by mouth daily.   Yes Historical Provider, MD  omeprazole (PRILOSEC) 20 MG capsule Take 20 mg by mouth 2 (two) times daily.    Yes Historical Provider, MD  potassium chloride SA (K-DUR,KLOR-CON) 20 MEQ tablet Take 20 mEq by mouth 2  (two) times daily.    Yes Historical Provider, MD  venlafaxine (EFFEXOR-XR) 150 MG 24 hr capsule Take 300 mg by mouth every morning.    Yes Historical Provider, MD  zolpidem (AMBIEN) 10 MG tablet Take 20 mg by mouth at bedtime.    Yes Historical Provider, MD  BYDUREON 2 MG SUSR Inject 2 mg into the skin every 7 (seven) days.  05/22/14   Historical Provider, MD  cephALEXin (KEFLEX) 500 MG capsule Take 1 capsule (500 mg total) by mouth 4 (four) times daily. 08/13/14   Fredia Sorrow, MD  HYDROcodone-acetaminophen (NORCO/VICODIN) 5-325 MG per tablet Take 1-2 tablets by mouth every  6 (six) hours as needed. 08/13/14   Fredia Sorrow, MD  ondansetron (ZOFRAN ODT) 4 MG disintegrating tablet Take 1 tablet (4 mg total) by mouth every 8 (eight) hours as needed. 08/13/14   Fredia Sorrow, MD  ondansetron (ZOFRAN) 4 MG tablet Take 1 tablet (4 mg total) by mouth every 6 (six) hours. Patient not taking: Reported on 08/13/2014 07/13/14   Virgel Manifold, MD  promethazine (PHENERGAN) 25 MG tablet Take 1 tablet (25 mg total) by mouth every 6 (six) hours as needed. 08/13/14   Fredia Sorrow, MD   BP 125/68 mmHg  Pulse 96  Temp(Src) 98 F (36.7 C) (Oral)  Resp 18  Ht 5\' 2"  (1.575 m)  Wt 120 lb (54.432 kg)  BMI 21.94 kg/m2  SpO2 100% Physical Exam  Constitutional: She is oriented to person, place, and time. She appears well-developed and well-nourished. No distress.  HENT:  Head: Normocephalic and atraumatic.  Mouth/Throat: Oropharynx is clear and moist.  Mucus membranes a little dry.  Eyes: Conjunctivae and EOM are normal. Pupils are equal, round, and reactive to light.  Neck: Neck supple. No tracheal deviation present.  Cardiovascular: Normal rate and regular rhythm.   Tachycardic.  Pulmonary/Chest: Effort normal. No respiratory distress.  Clear to auscultation.  Abdominal: Soft. Bowel sounds are normal. There is no tenderness.  Musculoskeletal: Normal range of motion.  Neurological: She is alert and  oriented to person, place, and time. No cranial nerve deficit. She exhibits normal muscle tone. Coordination normal.  Skin: Skin is warm and dry.  Psychiatric: She has a normal mood and affect. Her behavior is normal.  Nursing note and vitals reviewed.   ED Course  Procedures (including critical care time)  DIAGNOSTIC STUDIES: Oxygen Saturation is 100% on room air, normal by my interpretation.    COORDINATION OF CARE: 2:28 PM - Discussed treatment plan with pt at bedside and pt agreed to plan.   Labs Review Labs Reviewed  URINALYSIS, ROUTINE W REFLEX MICROSCOPIC - Abnormal; Notable for the following:    APPearance HAZY (*)    Leukocytes, UA SMALL (*)    All other components within normal limits  COMPREHENSIVE METABOLIC PANEL - Abnormal; Notable for the following:    Chloride 95 (*)    Glucose, Bld 228 (*)    Creatinine, Ser 1.21 (*)    ALT 54 (*)    GFR calc non Af Amer 47 (*)    GFR calc Af Amer 55 (*)    Anion gap 16 (*)    All other components within normal limits  CBG MONITORING, ED - Abnormal; Notable for the following:    Glucose-Capillary 135 (*)    All other components within normal limits  URINE CULTURE  CBC WITH DIFFERENTIAL  URINE MICROSCOPIC-ADD ON  LIPASE, BLOOD   Results for orders placed or performed during the hospital encounter of 08/13/14  Urinalysis, Routine w reflex microscopic  Result Value Ref Range   Color, Urine YELLOW YELLOW   APPearance HAZY (A) CLEAR   Specific Gravity, Urine 1.015 1.005 - 1.030   pH 6.0 5.0 - 8.0   Glucose, UA NEGATIVE NEGATIVE mg/dL   Hgb urine dipstick NEGATIVE NEGATIVE   Bilirubin Urine NEGATIVE NEGATIVE   Ketones, ur NEGATIVE NEGATIVE mg/dL   Protein, ur NEGATIVE NEGATIVE mg/dL   Urobilinogen, UA 0.2 0.0 - 1.0 mg/dL   Nitrite NEGATIVE NEGATIVE   Leukocytes, UA SMALL (A) NEGATIVE  CBC with Differential  Result Value Ref Range   WBC 6.6 4.0 -  10.5 K/uL   RBC 4.40 3.87 - 5.11 MIL/uL   Hemoglobin 13.4 12.0 - 15.0  g/dL   HCT 40.3 36.0 - 46.0 %   MCV 91.6 78.0 - 100.0 fL   MCH 30.5 26.0 - 34.0 pg   MCHC 33.3 30.0 - 36.0 g/dL   RDW 12.6 11.5 - 15.5 %   Platelets 341 150 - 400 K/uL   Neutrophils Relative % 54 43 - 77 %   Neutro Abs 3.6 1.7 - 7.7 K/uL   Lymphocytes Relative 36 12 - 46 %   Lymphs Abs 2.3 0.7 - 4.0 K/uL   Monocytes Relative 9 3 - 12 %   Monocytes Absolute 0.6 0.1 - 1.0 K/uL   Eosinophils Relative 2 0 - 5 %   Eosinophils Absolute 0.1 0.0 - 0.7 K/uL   Basophils Relative 1 0 - 1 %   Basophils Absolute 0.0 0.0 - 0.1 K/uL  Comprehensive metabolic panel  Result Value Ref Range   Sodium 139 137 - 147 mEq/L   Potassium 4.8 3.7 - 5.3 mEq/L   Chloride 95 (L) 96 - 112 mEq/L   CO2 28 19 - 32 mEq/L   Glucose, Bld 228 (H) 70 - 99 mg/dL   BUN 17 6 - 23 mg/dL   Creatinine, Ser 1.21 (H) 0.50 - 1.10 mg/dL   Calcium 9.6 8.4 - 10.5 mg/dL   Total Protein 7.9 6.0 - 8.3 g/dL   Albumin 4.4 3.5 - 5.2 g/dL   AST 37 0 - 37 U/L   ALT 54 (H) 0 - 35 U/L   Alkaline Phosphatase 96 39 - 117 U/L   Total Bilirubin 0.6 0.3 - 1.2 mg/dL   GFR calc non Af Amer 47 (L) >90 mL/min   GFR calc Af Amer 55 (L) >90 mL/min   Anion gap 16 (H) 5 - 15  Urine microscopic-add on  Result Value Ref Range   Squamous Epithelial / LPF RARE RARE   WBC, UA 21-50 <3 WBC/hpf   Bacteria, UA RARE RARE  Lipase, blood  Result Value Ref Range   Lipase 29 11 - 59 U/L  CBG monitoring, ED  Result Value Ref Range   Glucose-Capillary 135 (H) 70 - 99 mg/dL     Imaging Review Ct Abdomen Pelvis W Contrast  08/13/2014   CLINICAL DATA:  63 year old with generalized abdominal pain. Throwing up bile for over a month. History of cholecystectomy.  EXAM: CT ABDOMEN AND PELVIS WITH CONTRAST  TECHNIQUE: Multidetector CT imaging of the abdomen and pelvis was performed using the standard protocol following bolus administration of intravenous contrast.  CONTRAST:  126mL OMNIPAQUE IOHEXOL 300 MG/ML  SOLN  COMPARISON:  CT 11/13/2003  FINDINGS: Lung  bases are clear.  Negative for free air.  The gallbladder has been removed. Patient has a cardiac ICD. No significant pericardial or pleural fluid. Very mild intrahepatic biliary dilatation. No significant extrahepatic biliary dilatation. Portal venous system is patent. Normal appearance of the pancreas and spleen. Normal appearance of the adrenal glands. There is mild scarring in the right kidney without hydronephrosis. Normal appearance of the left kidney. No gross abnormality to the uterus or adnexa tissues. Fluid in the urinary bladder. No significant free fluid or lymphadenopathy. The stomach is not distended. No gross abnormality to the stomach or duodenum. There is a fatty attenuating structure in the distal duodenum and this is most compatible with a duodenal lipoma.  Moderate amount of stool in the distal colon. The ascending colon is redundant and  difficult to exclude any areas of wall thickening. No significant inflammation in this area. Normal appearance of the appendix. No significant small bowel distention. No evidence for bowel obstruction.  Bilateral pedicle screw and rod fixation L4-L5 with interbody fusion.  IMPRESSION: No acute abnormality within the abdomen or pelvis. No evidence for a bowel obstruction.  Evidence for a duodenal lipoma.  Scarring in the right kidney.   Electronically Signed   By: Markus Daft M.D.   On: 08/13/2014 17:50     EKG Interpretation None      MDM   Final diagnoses:  Abdominal pain  UTI (lower urinary tract infection)   Patient with extensive workup for the persistent vomiting which was ongoing for a month. No evidence of significant dehydration on exam. Heart rate improved with hydration. CT scan negative for any acute findings or any explanation for the left flank pain. Possibly could be musculoskeletal but does not explain the vomiting. Urinalysis with concerns for urinary tract infection. Urine culture sent to confirm. Treated with 1 g of Rocephin here  and will continue on Keflex. Also patient given Phenergan and Zofran for the nausea and vomiting and 10 tablets of hydrocodone for the pain in the meantime. Patient does have a history of a pacemaker and defibrillator no complaints of chest pain or shortness of breath today. Patient's lipase is normal patient's the liver function tests without significant abnormalities. Renal function slightly elevated as far as the creatinine goes. Blood sugar a little elevated but no evidence of metabolic acidosis. No leukocytosis no significant anemia.  I personally performed the services described in this documentation, which was scribed in my presence. The recorded information has been reviewed and is accurate.     Fredia Sorrow, MD 08/13/14 [phone]

## 2014-08-13 NOTE — Discharge Instructions (Signed)
Today's workup without significant findings. Make an appointment to follow-up with your regular doctor. CT scan of the abdomen and pelvis was negative. Does appear to be some evidence of urinary tract infection. Urine culture is sent to confirm. Take the Keflex antibiotic as directed. Take the Phenergan and Zofran as needed for nausea and vomiting. Take the hydrocodone as needed for pain.

## 2014-08-16 LAB — URINE CULTURE

## 2014-08-19 ENCOUNTER — Telehealth (HOSPITAL_COMMUNITY): Payer: Self-pay

## 2014-08-19 NOTE — Telephone Encounter (Signed)
Post ED Visit - Positive Culture Follow-up  Culture report reviewed by antimicrobial stewardship pharmacist: []  Wes Freistatt, Pharm.D., BCPS [x]  Heide Guile, Pharm.D., BCPS []  Alycia Rossetti, Pharm.D., BCPS []  [place], [place].D., BCPS, AAHIVP []  Legrand Como, Pharm.D., BCPS, AAHIVP []  Elicia Lamp, Pharm.D.   Positive Urine culture, >/= 20,000 colonies -> E Coli Treated with Cephalexin, organism sensitive to the same and no further patient follow-up is required at this time.  Dortha Kern 08/19/2014, 8:28 PM

## 2014-08-23 ENCOUNTER — Encounter (HOSPITAL_COMMUNITY): Payer: Self-pay | Admitting: Internal Medicine

## 2014-08-27 ENCOUNTER — Ambulatory Visit: Payer: Medicare Other | Admitting: Nutrition

## 2014-08-28 ENCOUNTER — Other Ambulatory Visit: Payer: Self-pay | Admitting: Internal Medicine

## 2014-09-10 ENCOUNTER — Encounter: Payer: Self-pay | Admitting: Nutrition

## 2014-09-10 ENCOUNTER — Encounter: Payer: Medicare Other | Attending: "Endocrinology | Admitting: Nutrition

## 2014-09-10 VITALS — Ht 62.0 in | Wt 124.6 lb

## 2014-09-10 DIAGNOSIS — IMO0002 Reserved for concepts with insufficient information to code with codable children: Secondary | ICD-10-CM

## 2014-09-10 DIAGNOSIS — E118 Type 2 diabetes mellitus with unspecified complications: Secondary | ICD-10-CM | POA: Diagnosis present

## 2014-09-10 DIAGNOSIS — Z713 Dietary counseling and surveillance: Secondary | ICD-10-CM | POA: Insufficient documentation

## 2014-09-10 DIAGNOSIS — E1165 Type 2 diabetes mellitus with hyperglycemia: Secondary | ICD-10-CM

## 2014-09-10 NOTE — Patient Instructions (Signed)
Goals:  Follow Diabetes Meal Plan as instructed  Eat 3 meals per day.  Eat at 9 am Breakfast, 1-2 pm Lunch and 6-7 pm Supper.  Limit carbohydrate intake to 30-45g grams carbohydrate/meal  Add lean protein foods to meals  Bring food record and glucose log to your next nutrition visit  Do not skip meals  Take Metformin after breakfast to reduce GI side effects.

## 2014-09-10 NOTE — Progress Notes (Signed)
  Medical Nutrition Therapy:  Appt start time: 0900 end time:  1000.   Assessment:  Primary concerns today: Diabetes. She lives with her husband. She does the shopping and cooking. Most recent A1C was 7.6%, down from 7.9%. Complains of not feeling well with nausea, vomiting and no energy. BS log brought in. FBS 150's. Evening blood sugars 140's. Metformin 1 g BID. Not eating balanced meals. Doesn't have much of an appetite at times. Being treated for depression. Lost her son 6 years ago in [place].  Preferred Learning Style:   Auditory  Hands on  No preference indicated   Learning Readiness:   Ready  Change in progress  MEDICATIONS: See list   DIETARY INTAKE:  24-hr recall:  B ( AM): Skip breakfast   Snk ( AM): Apple or fruit or nabs, water L ( PM): Chicken breast, 1/4 c creamed potaotes,water Snk ( PM): Popcorn 1 cup, peanuts, banana D ( PM): creamed potatoes, dark chocolate 3 pieces, water Snk ( PM):  Beverages: water  Usual physical activity: Walking, cleaning house  Estimated energy needs: 1500 calories 170 g carbohydrates 112 g protein 42 g fat  Progress Towards Goal(s):  In progress.   Nutritional Diagnosis:  NB-1.1 Food and nutrition-related knowledge deficit As related to Diabetes.  As evidenced by A1C 7.6%.    Intervention:  Nutrition counseling on diabetes, diet, meal planning, MY Plate, target goals, signs/symptoms of hypo/hyperglycemia and treatment of low blood sugars.  Goals:  Follow Diabetes Meal Plan as instructed  Eat 3 meals per day.  Eat at 9 am Breakfast, 1-2 pm Lunch and 6-7 pm Supper.  Limit carbohydrate intake to 30-45g grams carbohydrate/meal  Add lean protein foods to meals  Bring food record and glucose log to your next nutrition visit  Do not skip meals  Take Metformin after breakfast to reduce GI side effects.   Teaching Method Utilized:  Visual Auditory Hands on  Handouts given during visit include:  The Plate  Method  Carb Counting and Food Label handouts  Meal Plan Card  Barriers to learning/adherence to lifestyle change: None  Demonstrated degree of understanding via:  Teach Back   Monitoring/Evaluation:  Dietary intake, exercise, meal planning, and body weight in 1 month(s).

## 2014-09-21 DIAGNOSIS — E1165 Type 2 diabetes mellitus with hyperglycemia: Secondary | ICD-10-CM | POA: Diagnosis not present

## 2014-09-21 DIAGNOSIS — I10 Essential (primary) hypertension: Secondary | ICD-10-CM | POA: Diagnosis not present

## 2014-09-21 DIAGNOSIS — E782 Mixed hyperlipidemia: Secondary | ICD-10-CM | POA: Diagnosis not present

## 2014-09-28 DIAGNOSIS — E782 Mixed hyperlipidemia: Secondary | ICD-10-CM | POA: Diagnosis not present

## 2014-09-28 DIAGNOSIS — I10 Essential (primary) hypertension: Secondary | ICD-10-CM | POA: Diagnosis not present

## 2014-09-28 DIAGNOSIS — E1165 Type 2 diabetes mellitus with hyperglycemia: Secondary | ICD-10-CM | POA: Diagnosis not present

## 2014-10-01 DIAGNOSIS — E119 Type 2 diabetes mellitus without complications: Secondary | ICD-10-CM | POA: Diagnosis not present

## 2014-10-15 ENCOUNTER — Encounter: Payer: Medicare Other | Attending: "Endocrinology | Admitting: Nutrition

## 2014-10-15 VITALS — Ht 62.0 in | Wt 128.6 lb

## 2014-10-15 DIAGNOSIS — E1165 Type 2 diabetes mellitus with hyperglycemia: Secondary | ICD-10-CM

## 2014-10-15 DIAGNOSIS — IMO0002 Reserved for concepts with insufficient information to code with codable children: Secondary | ICD-10-CM

## 2014-10-15 DIAGNOSIS — E118 Type 2 diabetes mellitus with unspecified complications: Secondary | ICD-10-CM | POA: Insufficient documentation

## 2014-10-15 DIAGNOSIS — Z713 Dietary counseling and surveillance: Secondary | ICD-10-CM | POA: Diagnosis not present

## 2014-10-15 NOTE — Progress Notes (Signed)
  Medical Nutrition Therapy:  Appt start time: 1130 end time:  1145  Assessment:  Primary concerns today: Diabetes Follow up. Pt. Gained 4 lbs. BS are better. No hypoglycemia. BS 100-200's with occasional 300. Under a lot of stress.  Dr. Dorris Fetch advised her to go up to 26 units per day of her Levemir.  Most recent A1C was 7.6%. Down.rom 9.2%. She lives with her husband who drinks all day long per her report. She is going to see her mother and family for the next month.    She wasn't able to keep a food journal. Drinking Glucerna shakes for breakfast rather than nothing. Eating TV dinners some since her husband doesn't eat meals.    Preferred Learning Style:   Auditory  Hands on  No preference indicated   Learning Readiness:   Ready  Change in progress  MEDICATIONS: See list   DIETARY INTAKE:  24-hr recall:  B ( AM) yogurt , water, coffee   Snk ( AM):none L ( PM): Taco- meat, lettuce and  Snk ( PM): orange D ( PM): Healthy CHoice and adkins bar, water Snk ( PM): orange Beverages: water  Usual physical activity: Walking, cleaning house  Estimated energy needs: 1500 calories 170 g carbohydrates 112 g protein 42 g fat  Progress Towards Goal(s):  In progress.   Nutritional Diagnosis:  NB-1.1 Food and nutrition-related knowledge deficit As related to Diabetes.  As evidenced by A1C 7.6%.    Intervention:  Nutrition counseling on diabetes, diet, meal planning, MY Plate, target goals, signs/symptoms of hypo/hyperglycemia and treatment of low blood sugars.  Goals:  Keep up the good work!   Follow Diabetes Meal Plan as instructed  Be sure to take care of yourself by eating  3 meals per day to include 2-3 carb choices, protein and vegetables.  Eat at 9 am Breakfast, 1-2 pm Lunch and 6-7 pm Supper.  Drink a Glucerna and a piece of fruit if needed for a meal   Continue drinking water.  Bring food record and glucose log to your next nutrition visit  Do not skip  meals Take Metformin after breakfast to reduce GI side effects. Call Dr. Dorris Fetch if blood sugars are over 300 mg/dl even when out of town.  Maintain /gain weight gradually.  May consider going to [place] to help you deal with husband and his drinking problem.   Teaching Method Utilized:  Visual Auditory Hands on  Handouts given during visit include:  The Plate Method    Barriers to learning/adherence to lifestyle change: None  Demonstrated degree of understanding via:  Teach Back   Monitoring/Evaluation:  Dietary intake, exercise, meal planning, and body weight in 1 month(s).

## 2014-10-15 NOTE — Patient Instructions (Signed)
Goals:  Keep up the good work!   Follow Diabetes Meal Plan as instructed  Be sure to take care of yourself by eating  3 meals per day to include 2-3 carb choices, protein and vegetables.  Eat at 9 am Breakfast, 1-2 pm Lunch and 6-7 pm Supper.  Drink a Glucerna and a piece of fruit if needed for a meal   Continue drinking water.  Bring food record and glucose log to your next nutrition visit  Do not skip meals Take Metformin after breakfast to reduce GI side effects. Call Dr. Dorris Fetch if blood sugars are over 300 mg/dl even when out of town.  Maintain /gain weight gradually.  May consider going to [place] to help you deal with husband and his drinking problem.

## 2014-10-22 DIAGNOSIS — I5022 Chronic systolic (congestive) heart failure: Secondary | ICD-10-CM

## 2014-10-22 DIAGNOSIS — I429 Cardiomyopathy, unspecified: Secondary | ICD-10-CM

## 2014-10-24 ENCOUNTER — Ambulatory Visit (INDEPENDENT_AMBULATORY_CARE_PROVIDER_SITE_OTHER): Payer: Medicare Other | Admitting: *Deleted

## 2014-10-24 DIAGNOSIS — I5022 Chronic systolic (congestive) heart failure: Secondary | ICD-10-CM

## 2014-10-24 DIAGNOSIS — I429 Cardiomyopathy, unspecified: Secondary | ICD-10-CM

## 2014-10-24 DIAGNOSIS — E119 Type 2 diabetes mellitus without complications: Secondary | ICD-10-CM | POA: Diagnosis not present

## 2014-10-24 NOTE — Progress Notes (Signed)
Remote ICD transmission.   

## 2014-10-26 LAB — MDC_IDC_ENUM_SESS_TYPE_REMOTE
Brady Statistic AP VP Percent: 0.02 %
Brady Statistic AP VS Percent: 0.02 %
HighPow Impedance: 60 Ohm
Lead Channel Impedance Value: 342 Ohm
Lead Channel Impedance Value: 456 Ohm
Lead Channel Impedance Value: 475 Ohm
Lead Channel Impedance Value: 589 Ohm
Lead Channel Impedance Value: 703 Ohm
Lead Channel Pacing Threshold Amplitude: 0.625 V
Lead Channel Pacing Threshold Amplitude: 1.125 V
Lead Channel Pacing Threshold Pulse Width: 0.4 ms
Lead Channel Pacing Threshold Pulse Width: 0.4 ms
Lead Channel Sensing Intrinsic Amplitude: 12.75 mV
Lead Channel Sensing Intrinsic Amplitude: 2.75 mV
Lead Channel Sensing Intrinsic Amplitude: 2.75 mV
Lead Channel Setting Pacing Amplitude: 2 V
Lead Channel Setting Pacing Amplitude: 2.5 V
Lead Channel Setting Pacing Pulse Width: 0.4 ms
Lead Channel Setting Sensing Sensitivity: 0.3 mV
MDC IDC MSMT BATTERY REMAINING LONGEVITY: 73 mo
MDC IDC MSMT BATTERY VOLTAGE: 2.99 V
MDC IDC MSMT LEADCHNL LV PACING THRESHOLD PULSEWIDTH: 0.8 ms
MDC IDC MSMT LEADCHNL RV IMPEDANCE VALUE: 342 Ohm
MDC IDC MSMT LEADCHNL RV PACING THRESHOLD AMPLITUDE: 0.875 V
MDC IDC MSMT LEADCHNL RV SENSING INTR AMPL: 12.75 mV
MDC IDC SESS DTM: 20160208173008
MDC IDC SET LEADCHNL LV PACING AMPLITUDE: 2.25 V
MDC IDC SET LEADCHNL LV PACING PULSEWIDTH: 0.8 ms
MDC IDC SET ZONE DETECTION INTERVAL: 300 ms
MDC IDC STAT BRADY AS VP PERCENT: 98.75 %
MDC IDC STAT BRADY AS VS PERCENT: 1.22 %
MDC IDC STAT BRADY RA PERCENT PACED: 0.03 %
MDC IDC STAT BRADY RV PERCENT PACED: 37.26 %
Zone Setting Detection Interval: 350 ms
Zone Setting Detection Interval: 350 ms
Zone Setting Detection Interval: 360 ms

## 2014-10-31 ENCOUNTER — Telehealth: Payer: Self-pay | Admitting: *Deleted

## 2014-10-31 NOTE — Telephone Encounter (Signed)
F/U        Pt returning call.     Please call back at [phone].

## 2014-10-31 NOTE — Telephone Encounter (Signed)
Pt instructed to take one extra Lasix for the next three days. Pt expressed understanding. Pt has noticed increase in weight >5lbs. She also mentioned swollen ankles.    Pt will send extra manual remote on 11/05/14 to review CorVue.

## 2014-10-31 NOTE — Telephone Encounter (Signed)
LMOVM w/ my direct #.  Re: increase Lasix due to abnormal CorVue

## 2014-11-02 ENCOUNTER — Encounter: Payer: Self-pay | Admitting: Internal Medicine

## 2014-11-05 ENCOUNTER — Telehealth: Payer: Self-pay | Admitting: Cardiology

## 2014-11-05 ENCOUNTER — Ambulatory Visit (INDEPENDENT_AMBULATORY_CARE_PROVIDER_SITE_OTHER): Payer: Medicare Other | Admitting: *Deleted

## 2014-11-05 DIAGNOSIS — Z95810 Presence of automatic (implantable) cardiac defibrillator: Secondary | ICD-10-CM

## 2014-11-05 NOTE — Progress Notes (Signed)
Remote ICD transmission.   

## 2014-11-05 NOTE — Telephone Encounter (Signed)
Spoke with pt and reminded pt of remote transmission that is due today. Pt verbalized understanding.   

## 2014-11-06 ENCOUNTER — Telehealth: Payer: Self-pay | Admitting: *Deleted

## 2014-11-07 LAB — MDC_IDC_ENUM_SESS_TYPE_REMOTE
Battery Voltage: 2.99 V
Brady Statistic AP VP Percent: 0.02 %
Brady Statistic AP VS Percent: 0.01 %
Brady Statistic AS VP Percent: 98.46 %
Brady Statistic AS VS Percent: 1.51 %
Brady Statistic RA Percent Paced: 0.03 %
Date Time Interrogation Session: 20160222203458
HIGH POWER IMPEDANCE MEASURED VALUE: 69 Ohm
Lead Channel Impedance Value: 418 Ohm
Lead Channel Impedance Value: 532 Ohm
Lead Channel Impedance Value: 551 Ohm
Lead Channel Impedance Value: 817 Ohm
Lead Channel Pacing Threshold Amplitude: 0.875 V
Lead Channel Pacing Threshold Amplitude: 1 V
Lead Channel Pacing Threshold Pulse Width: 0.4 ms
Lead Channel Pacing Threshold Pulse Width: 0.8 ms
Lead Channel Sensing Intrinsic Amplitude: 2.375 mV
Lead Channel Sensing Intrinsic Amplitude: 2.375 mV
Lead Channel Sensing Intrinsic Amplitude: 22.5 mV
Lead Channel Sensing Intrinsic Amplitude: 22.5 mV
Lead Channel Setting Pacing Amplitude: 2 V
Lead Channel Setting Pacing Amplitude: 2 V
Lead Channel Setting Pacing Pulse Width: 0.8 ms
MDC IDC MSMT BATTERY REMAINING LONGEVITY: 79 mo
MDC IDC MSMT LEADCHNL LV IMPEDANCE VALUE: 361 Ohm
MDC IDC MSMT LEADCHNL RA IMPEDANCE VALUE: 608 Ohm
MDC IDC MSMT LEADCHNL RA PACING THRESHOLD AMPLITUDE: 0.5 V
MDC IDC MSMT LEADCHNL RA PACING THRESHOLD PULSEWIDTH: 0.4 ms
MDC IDC SET LEADCHNL RV PACING AMPLITUDE: 2.5 V
MDC IDC SET LEADCHNL RV PACING PULSEWIDTH: 0.4 ms
MDC IDC SET LEADCHNL RV SENSING SENSITIVITY: 0.3 mV
MDC IDC SET ZONE DETECTION INTERVAL: 350 ms
MDC IDC SET ZONE DETECTION INTERVAL: 350 ms
MDC IDC STAT BRADY RV PERCENT PACED: 23.68 %
Zone Setting Detection Interval: 300 ms
Zone Setting Detection Interval: 360 ms

## 2014-11-07 NOTE — Telephone Encounter (Signed)
Spoke to patient about abnormal Optivol. I informed patient that I showed the report to [facility] last night, and although it appears to be getting better it is still abnormal.  V.O. Per [facility] patient needs to take extra Lasix x 3 more days and send in another remote in 1 week. Patient voiced understanding to instructions given.

## 2014-11-14 ENCOUNTER — Ambulatory Visit (INDEPENDENT_AMBULATORY_CARE_PROVIDER_SITE_OTHER): Payer: Medicare Other | Admitting: *Deleted

## 2014-11-14 DIAGNOSIS — I5042 Chronic combined systolic (congestive) and diastolic (congestive) heart failure: Secondary | ICD-10-CM | POA: Diagnosis not present

## 2014-11-14 DIAGNOSIS — Z95810 Presence of automatic (implantable) cardiac defibrillator: Secondary | ICD-10-CM

## 2014-11-14 NOTE — Progress Notes (Signed)
Remote ICD transmission.   

## 2014-11-16 ENCOUNTER — Encounter: Payer: Self-pay | Admitting: Cardiology

## 2014-11-16 LAB — MDC_IDC_ENUM_SESS_TYPE_REMOTE
Battery Voltage: 2.99 V
Brady Statistic AP VP Percent: 0 %
Brady Statistic AP VS Percent: 0.01 %
Brady Statistic AS VP Percent: 98.08 %
Brady Statistic RA Percent Paced: 0.02 %
HighPow Impedance: 72 Ohm
Lead Channel Impedance Value: 361 Ohm
Lead Channel Impedance Value: 399 Ohm
Lead Channel Impedance Value: 513 Ohm
Lead Channel Impedance Value: 589 Ohm
Lead Channel Impedance Value: 779 Ohm
Lead Channel Pacing Threshold Amplitude: 0.75 V
Lead Channel Pacing Threshold Pulse Width: 0.4 ms
Lead Channel Pacing Threshold Pulse Width: 0.8 ms
Lead Channel Sensing Intrinsic Amplitude: 17 mV
Lead Channel Sensing Intrinsic Amplitude: 17 mV
Lead Channel Setting Pacing Amplitude: 2 V
Lead Channel Setting Pacing Amplitude: 2.5 V
Lead Channel Setting Pacing Pulse Width: 0.8 ms
Lead Channel Setting Sensing Sensitivity: 0.3 mV
MDC IDC MSMT BATTERY REMAINING LONGEVITY: 77 mo
MDC IDC MSMT LEADCHNL LV IMPEDANCE VALUE: 532 Ohm
MDC IDC MSMT LEADCHNL LV PACING THRESHOLD AMPLITUDE: 1 V
MDC IDC MSMT LEADCHNL RA PACING THRESHOLD AMPLITUDE: 0.625 V
MDC IDC MSMT LEADCHNL RA SENSING INTR AMPL: 2.875 mV
MDC IDC MSMT LEADCHNL RA SENSING INTR AMPL: 2.875 mV
MDC IDC MSMT LEADCHNL RV PACING THRESHOLD PULSEWIDTH: 0.4 ms
MDC IDC SESS DTM: 20160302150612
MDC IDC SET LEADCHNL RA PACING AMPLITUDE: 2 V
MDC IDC SET LEADCHNL RV PACING PULSEWIDTH: 0.4 ms
MDC IDC SET ZONE DETECTION INTERVAL: 350 ms
MDC IDC STAT BRADY AS VS PERCENT: 1.91 %
MDC IDC STAT BRADY RV PERCENT PACED: 41.69 %
Zone Setting Detection Interval: 300 ms
Zone Setting Detection Interval: 350 ms
Zone Setting Detection Interval: 360 ms

## 2014-11-19 DIAGNOSIS — Z713 Dietary counseling and surveillance: Secondary | ICD-10-CM | POA: Diagnosis not present

## 2014-11-19 DIAGNOSIS — E782 Mixed hyperlipidemia: Secondary | ICD-10-CM | POA: Diagnosis not present

## 2014-11-19 DIAGNOSIS — I10 Essential (primary) hypertension: Secondary | ICD-10-CM | POA: Diagnosis not present

## 2014-11-19 DIAGNOSIS — E1165 Type 2 diabetes mellitus with hyperglycemia: Secondary | ICD-10-CM | POA: Diagnosis not present

## 2014-11-21 ENCOUNTER — Encounter: Payer: Self-pay | Admitting: Internal Medicine

## 2014-11-21 DIAGNOSIS — I10 Essential (primary) hypertension: Secondary | ICD-10-CM | POA: Diagnosis not present

## 2014-11-21 DIAGNOSIS — I420 Dilated cardiomyopathy: Secondary | ICD-10-CM | POA: Diagnosis not present

## 2014-11-21 DIAGNOSIS — M545 Low back pain: Secondary | ICD-10-CM | POA: Diagnosis not present

## 2014-11-21 DIAGNOSIS — E1165 Type 2 diabetes mellitus with hyperglycemia: Secondary | ICD-10-CM | POA: Diagnosis not present

## 2014-11-28 DIAGNOSIS — E1165 Type 2 diabetes mellitus with hyperglycemia: Secondary | ICD-10-CM | POA: Diagnosis not present

## 2014-11-28 DIAGNOSIS — E782 Mixed hyperlipidemia: Secondary | ICD-10-CM | POA: Diagnosis not present

## 2014-11-28 DIAGNOSIS — I10 Essential (primary) hypertension: Secondary | ICD-10-CM | POA: Diagnosis not present

## 2014-11-29 ENCOUNTER — Encounter: Payer: Self-pay | Admitting: Cardiology

## 2014-12-05 ENCOUNTER — Encounter: Payer: Self-pay | Admitting: Internal Medicine

## 2014-12-11 ENCOUNTER — Encounter: Payer: Self-pay | Admitting: Adult Health

## 2014-12-13 DIAGNOSIS — E119 Type 2 diabetes mellitus without complications: Secondary | ICD-10-CM | POA: Diagnosis not present

## 2014-12-25 ENCOUNTER — Telehealth: Payer: Self-pay | Admitting: Internal Medicine

## 2014-12-25 NOTE — Telephone Encounter (Signed)
She c/o her rt foot and rt hand swelling and she has gained 3 pounds since Sun and she feels her abdomen is distended  She takes Furosemide 40 mg--3 tablets every morning She says she can not sleep and she takes a 10 mg Ambien.  Mild SOB but denies CP

## 2014-12-25 NOTE — Telephone Encounter (Signed)
New Message  Per pt- pt retaining fluid, wanted to speak w/ Rn about what to do going forward. Please call back and discuss.

## 2014-12-25 NOTE — Telephone Encounter (Signed)
Received and reviewed transmission   Fluid levels stable.  Will be in touch with patient on Fri

## 2014-12-25 NOTE — Telephone Encounter (Signed)
Discussed with patient and will increase Furosemide and take an additional 40 mg at lunch on Wed and Thurs and I will touch base with her tomorrow.  She verbalized understanding and will send a transmission for [name] to check her fluid levels

## 2014-12-28 NOTE — Telephone Encounter (Signed)
Spoke with patient and she is down in 1 1/2 pounds.  She resume her regular dose of Furosemide tomorrow and call next week if she has any further problems

## 2015-01-07 DIAGNOSIS — M79641 Pain in right hand: Secondary | ICD-10-CM | POA: Diagnosis not present

## 2015-01-07 DIAGNOSIS — R200 Anesthesia of skin: Secondary | ICD-10-CM | POA: Diagnosis not present

## 2015-01-07 DIAGNOSIS — R209 Unspecified disturbances of skin sensation: Secondary | ICD-10-CM | POA: Diagnosis not present

## 2015-01-09 ENCOUNTER — Telehealth: Payer: Self-pay | Admitting: Gastroenterology

## 2015-01-09 ENCOUNTER — Encounter: Payer: Self-pay | Admitting: Gastroenterology

## 2015-01-09 NOTE — Telephone Encounter (Signed)
Letter was sent by [facility] today.

## 2015-01-09 NOTE — Telephone Encounter (Signed)
RECALL FOR [facility] °

## 2015-01-16 DIAGNOSIS — A770 Spotted fever due to Rickettsia rickettsii: Secondary | ICD-10-CM | POA: Diagnosis not present

## 2015-01-16 DIAGNOSIS — W57XXXA Bitten or stung by nonvenomous insect and other nonvenomous arthropods, initial encounter: Secondary | ICD-10-CM | POA: Diagnosis not present

## 2015-01-21 ENCOUNTER — Other Ambulatory Visit: Payer: Self-pay | Admitting: Adult Health

## 2015-01-24 DIAGNOSIS — R339 Retention of urine, unspecified: Secondary | ICD-10-CM | POA: Diagnosis not present

## 2015-01-30 ENCOUNTER — Encounter: Payer: Self-pay | Admitting: Adult Health

## 2015-01-30 ENCOUNTER — Other Ambulatory Visit (HOSPITAL_COMMUNITY)
Admission: RE | Admit: 2015-01-30 | Discharge: 2015-01-30 | Disposition: A | Discharge status: Home / Self Care | Payer: Medicare Other | Source: Ambulatory Visit | Attending: Adult Health | Admitting: Adult Health

## 2015-01-30 ENCOUNTER — Ambulatory Visit (INDEPENDENT_AMBULATORY_CARE_PROVIDER_SITE_OTHER): Payer: Medicare Other | Admitting: Adult Health

## 2015-01-30 VITALS — BP 110/60 | HR 88 | Ht 61.5 in | Wt 137.5 lb

## 2015-01-30 DIAGNOSIS — Z124 Encounter for screening for malignant neoplasm of cervix: Secondary | ICD-10-CM | POA: Diagnosis not present

## 2015-01-30 DIAGNOSIS — Z01419 Encounter for gynecological examination (general) (routine) without abnormal findings: Secondary | ICD-10-CM | POA: Diagnosis not present

## 2015-01-30 DIAGNOSIS — Z1151 Encounter for screening for human papillomavirus (HPV): Secondary | ICD-10-CM | POA: Diagnosis not present

## 2015-01-30 DIAGNOSIS — R87810 Cervical high risk human papillomavirus (HPV) DNA test positive: Secondary | ICD-10-CM

## 2015-01-30 DIAGNOSIS — Z1212 Encounter for screening for malignant neoplasm of rectum: Secondary | ICD-10-CM

## 2015-01-30 DIAGNOSIS — E119 Type 2 diabetes mellitus without complications: Secondary | ICD-10-CM | POA: Diagnosis not present

## 2015-01-30 DIAGNOSIS — K5900 Constipation, unspecified: Secondary | ICD-10-CM

## 2015-01-30 HISTORY — DX: Constipation, unspecified: K59.00

## 2015-01-30 HISTORY — DX: Cervical high risk human papillomavirus (HPV) DNA test positive: R87.810

## 2015-01-30 LAB — HEMOCCULT GUIAC POC 1CARD (OFFICE): FECAL OCCULT BLD: NEGATIVE

## 2015-01-30 MED ORDER — LUBIPROSTONE 24 MCG PO CAPS: 24.0000 ug | ORAL_CAPSULE | Freq: Every day | ORAL | Status: DC

## 2015-01-30 NOTE — Progress Notes (Signed)
Patient ID: [name], female   DOB: May 25, 1951, 64 y.o.   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 PCP is Dr Luan Pulling and sees Dr Dorris Fetch for diabetes.  Current Medications, Allergies, Past Medical History, Past Surgical History, Family History and Social History were reviewed in [employer] record.     Review of Systems: Patient denies any headaches, hearing loss, fatigue, blurred vision, shortness of breath, chest pain, abdominal pain, problems with urination(she caths self), or intercourse(not having sex). No joint pain or mood swings. Has numbness right hand, to see [place] next week.   Physical Exam:BP 110/60 mmHg  Pulse 88  Ht 5' 1.5" (1.562 m)  Wt 137 lb 8 oz (62.37 kg)  BMI 25.56 kg/m2 General:  Well developed, well nourished, no acute distress Skin:  Warm and dry Neck:  Midline trachea, normal thyroid, good ROM, no lymphadenopathy Lungs; Clear to auscultation bilaterally Breast:  No dominant palpable mass, retraction, or nipple discharge,has defibrillator above left breast Cardiovascular: Regular rate and rhythm Abdomen:  Soft, non tender, no hepatosplenomegaly Pelvic:  External genitalia is normal in appearance, no lesions.  The vagina has decreased color, moisture and rugae. Urethra has no lesions or masses. The cervix is atrophic and stenotic at os, pap with HPV performed.  Uterus is felt to be normal size, shape, and contour.  No adnexal masses or tenderness noted.Bladder is non tender, no masses felt. Rectal: Good sphincter tone, no polyps, external hemorrhoids felt.  Hemoccult negative. Extremities/musculoskeletal:  No swelling or varicosities noted, no clubbing or cyanosis Psych:  No mood changes, alert and cooperative,seems  happy   Impression: Well woman gyn exam with pap History of +HPV Constipation     Plan: Rx amitiza 24 mcg #30 1 daily with 6 refills Physical in  1 year Mammogram yearly Colonoscopy per [facility] with PCP

## 2015-01-30 NOTE — Patient Instructions (Signed)
Physical in 1 year Mammogram yearly Colonoscopy per [facility] with PCP Constipation Constipation is when a person has fewer than three bowel movements a week, has difficulty having a bowel movement, or has stools that are dry, hard, or larger than normal. As people grow older, constipation is more common. If you try to fix constipation with medicines that make you have a bowel movement (laxatives), the problem may get worse. Long-term laxative use may cause the muscles of the colon to become weak. A low-fiber diet, not taking in enough fluids, and taking certain medicines may make constipation worse.  CAUSES   Certain medicines, such as antidepressants, pain medicine, iron supplements, antacids, and water pills.   Certain diseases, such as diabetes, irritable bowel syndrome (IBS), thyroid disease, or depression.   Not drinking enough water.   Not eating enough fiber-rich foods.   Stress or travel.   Lack of physical activity or exercise.   Ignoring the urge to have a bowel movement.   Using laxatives too much.  SIGNS AND SYMPTOMS   Having fewer than three bowel movements a week.   Straining to have a bowel movement.   Having stools that are hard, dry, or larger than normal.   Feeling full or bloated.   Pain in the lower abdomen.   Not feeling relief after having a bowel movement.  DIAGNOSIS  Your health care provider will take a medical history and perform a physical exam. Further testing may be done for severe constipation. Some tests may include:  A barium enema X-ray to examine your rectum, colon, and, sometimes, your small intestine.   A sigmoidoscopy to examine your lower colon.   A colonoscopy to examine your entire colon. TREATMENT  Treatment will depend on the severity of your constipation and what is causing it. Some dietary treatments include drinking more fluids and eating more fiber-rich foods. Lifestyle treatments may include regular exercise.  If these diet and lifestyle recommendations do not help, your health care provider may recommend taking over-the-counter laxative medicines to help you have bowel movements. Prescription medicines may be prescribed if over-the-counter medicines do not work.  HOME CARE INSTRUCTIONS   Eat foods that have a lot of fiber, such as fruits, vegetables, whole grains, and beans.  Limit foods high in fat and processed sugars, such as french fries, hamburgers, cookies, candies, and soda.   A fiber supplement may be added to your diet if you cannot get enough fiber from foods.   Drink enough fluids to keep your urine clear or pale yellow.   Exercise regularly or as directed by your health care provider.   Go to the restroom when you have the urge to go. Do not hold it.   Only take over-the-counter or prescription medicines as directed by your health care provider. Do not take other medicines for constipation without talking to your health care provider first.  [place] IF:   You have bright red blood in your stool.   Your constipation lasts for more than 4 days or gets worse.   You have abdominal or rectal pain.   You have thin, pencil-like stools.   You have unexplained weight loss. MAKE SURE YOU:   Understand these instructions.  Will watch your condition.  Will get help right away if you are not doing well or get worse. Document Released: 05/29/2004 Document Revised: 09/05/2013 Document Reviewed: 06/12/2013 [facility] Patient Information 2015 [place], [place]. This information is not intended to replace advice given to  to you by your health care provider. Make sure you discuss any questions you have with your health care provider.  

## 2015-02-01 ENCOUNTER — Ambulatory Visit (INDEPENDENT_AMBULATORY_CARE_PROVIDER_SITE_OTHER): Payer: Medicare Other | Admitting: Internal Medicine

## 2015-02-01 ENCOUNTER — Encounter: Payer: Self-pay | Admitting: Internal Medicine

## 2015-02-01 ENCOUNTER — Encounter: Payer: Medicare Other | Admitting: Internal Medicine

## 2015-02-01 VITALS — BP 132/70 | HR 71 | Ht 62.0 in | Wt 140.0 lb

## 2015-02-01 DIAGNOSIS — I255 Ischemic cardiomyopathy: Secondary | ICD-10-CM

## 2015-02-01 DIAGNOSIS — Z95810 Presence of automatic (implantable) cardiac defibrillator: Secondary | ICD-10-CM

## 2015-02-01 DIAGNOSIS — Z136 Encounter for screening for cardiovascular disorders: Secondary | ICD-10-CM | POA: Diagnosis not present

## 2015-02-01 DIAGNOSIS — I5022 Chronic systolic (congestive) heart failure: Secondary | ICD-10-CM

## 2015-02-01 DIAGNOSIS — I5042 Chronic combined systolic (congestive) and diastolic (congestive) heart failure: Secondary | ICD-10-CM

## 2015-02-01 DIAGNOSIS — I119 Hypertensive heart disease without heart failure: Secondary | ICD-10-CM

## 2015-02-01 LAB — CUP PACEART INCLINIC DEVICE CHECK
Battery Voltage: 2.98 V
Brady Statistic AP VP Percent: 0.02 %
Brady Statistic AP VS Percent: 0.02 %
Brady Statistic AS VS Percent: 2.2 %
Brady Statistic RA Percent Paced: 0.03 %
Brady Statistic RV Percent Paced: 29.53 %
HighPow Impedance: 190 Ohm
HighPow Impedance: 75 Ohm
Lead Channel Impedance Value: 399 Ohm
Lead Channel Impedance Value: 513 Ohm
Lead Channel Impedance Value: 551 Ohm
Lead Channel Impedance Value: 608 Ohm
Lead Channel Impedance Value: 817 Ohm
Lead Channel Pacing Threshold Pulse Width: 0.4 ms
Lead Channel Pacing Threshold Pulse Width: 0.8 ms
Lead Channel Sensing Intrinsic Amplitude: 4 mV
Lead Channel Setting Pacing Amplitude: 2 V
Lead Channel Setting Pacing Pulse Width: 0.4 ms
Lead Channel Setting Sensing Sensitivity: 0.3 mV
MDC IDC MSMT BATTERY REMAINING LONGEVITY: 75 mo
MDC IDC MSMT LEADCHNL LV PACING THRESHOLD AMPLITUDE: 0.5 V
MDC IDC MSMT LEADCHNL RA PACING THRESHOLD AMPLITUDE: 0.5 V
MDC IDC MSMT LEADCHNL RA PACING THRESHOLD PULSEWIDTH: 0.4 ms
MDC IDC MSMT LEADCHNL RV PACING THRESHOLD AMPLITUDE: 0.5 V
MDC IDC MSMT LEADCHNL RV SENSING INTR AMPL: 15.625 mV
MDC IDC SESS DTM: 20160520100012
MDC IDC SET LEADCHNL LV PACING AMPLITUDE: 2 V
MDC IDC SET LEADCHNL LV PACING PULSEWIDTH: 0.8 ms
MDC IDC SET LEADCHNL RV PACING AMPLITUDE: 2.5 V
MDC IDC SET ZONE DETECTION INTERVAL: 350 ms
MDC IDC SET ZONE DETECTION INTERVAL: 350 ms
MDC IDC STAT BRADY AS VP PERCENT: 97.76 %
Zone Setting Detection Interval: 300 ms
Zone Setting Detection Interval: 360 ms
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
## 2015-02-01 NOTE — Patient Instructions (Signed)
Your physician wants you to follow-up in: 12 months with Dr. Lovena Le.  You will receive a reminder letter in the mail two months in advance. If you don't receive a letter, please call our office to schedule the follow-up appointment.  Your physician recommends that you continue on your current medications as directed. Please refer to the Current Medication list given to you today.  Remote monitoring is used to monitor your Pacemaker of ICD from home. This monitoring reduces the number of office visits required to check your device to one time per year. It allows [name] to keep an eye on the functioning of your device to ensure it is working properly. You are scheduled for a device check from home on 05/06/15. You may send your transmission at any time that day. If you have a wireless device, the transmission will be sent automatically. After your physician reviews your transmission, you will receive a postcard with your next transmission date.  Thank you for choosing [facility]!

## 2015-02-01 NOTE — Assessment & Plan Note (Signed)
Her symptoms are well compensated. She will continue her current meds. She is class 1.

## 2015-02-01 NOTE — Assessment & Plan Note (Signed)
Her medtronic biv ICD is working normally. Will recheck in several months.

## 2015-02-01 NOTE — Assessment & Plan Note (Signed)
Her blood pressure is well controlled. She will continue her current meds.  

## 2015-02-04 DIAGNOSIS — M79642 Pain in left hand: Secondary | ICD-10-CM | POA: Diagnosis not present

## 2015-02-04 LAB — CYTOLOGY - PAP

## 2015-02-06 ENCOUNTER — Telehealth: Payer: Self-pay | Admitting: Adult Health

## 2015-02-06 NOTE — Telephone Encounter (Signed)
Left message I called 

## 2015-02-06 NOTE — Telephone Encounter (Signed)
Pt aware pap negative but +HPV, repeat pap in 1 year , but if will worry can do colpo

## 2015-02-19 ENCOUNTER — Other Ambulatory Visit: Payer: Self-pay | Admitting: Internal Medicine

## 2015-02-19 DIAGNOSIS — E1165 Type 2 diabetes mellitus with hyperglycemia: Secondary | ICD-10-CM | POA: Diagnosis not present

## 2015-02-19 DIAGNOSIS — E559 Vitamin D deficiency, unspecified: Secondary | ICD-10-CM | POA: Diagnosis not present

## 2015-02-19 DIAGNOSIS — E782 Mixed hyperlipidemia: Secondary | ICD-10-CM | POA: Diagnosis not present

## 2015-02-19 DIAGNOSIS — I10 Essential (primary) hypertension: Secondary | ICD-10-CM | POA: Diagnosis not present

## 2015-02-20 ENCOUNTER — Other Ambulatory Visit: Payer: Self-pay

## 2015-02-20 MED ORDER — FUROSEMIDE 40 MG PO TABS: 120.0000 mg | ORAL_TABLET | Freq: Every day | ORAL | Status: DC

## 2015-02-21 DIAGNOSIS — Z0000 Encounter for general adult medical examination without abnormal findings: Secondary | ICD-10-CM | POA: Diagnosis not present

## 2015-02-21 DIAGNOSIS — Z23 Encounter for immunization: Secondary | ICD-10-CM | POA: Diagnosis not present

## 2015-03-06 DIAGNOSIS — E1165 Type 2 diabetes mellitus with hyperglycemia: Secondary | ICD-10-CM | POA: Diagnosis not present

## 2015-03-06 DIAGNOSIS — E782 Mixed hyperlipidemia: Secondary | ICD-10-CM | POA: Diagnosis not present

## 2015-03-06 DIAGNOSIS — I10 Essential (primary) hypertension: Secondary | ICD-10-CM | POA: Diagnosis not present

## 2015-03-11 ENCOUNTER — Other Ambulatory Visit: Payer: Self-pay

## 2015-03-12 DIAGNOSIS — E119 Type 2 diabetes mellitus without complications: Secondary | ICD-10-CM | POA: Diagnosis not present

## 2015-03-13 DIAGNOSIS — G5601 Carpal tunnel syndrome, right upper limb: Secondary | ICD-10-CM | POA: Diagnosis not present

## 2015-03-26 DIAGNOSIS — H2512 Age-related nuclear cataract, left eye: Secondary | ICD-10-CM | POA: Diagnosis not present

## 2015-03-26 DIAGNOSIS — G5601 Carpal tunnel syndrome, right upper limb: Secondary | ICD-10-CM | POA: Diagnosis not present

## 2015-03-26 DIAGNOSIS — G5602 Carpal tunnel syndrome, left upper limb: Secondary | ICD-10-CM | POA: Diagnosis not present

## 2015-04-09 DIAGNOSIS — G5601 Carpal tunnel syndrome, right upper limb: Secondary | ICD-10-CM | POA: Diagnosis not present

## 2015-04-10 DIAGNOSIS — E119 Type 2 diabetes mellitus without complications: Secondary | ICD-10-CM | POA: Diagnosis not present

## 2015-04-22 DIAGNOSIS — Z4789 Encounter for other orthopedic aftercare: Secondary | ICD-10-CM | POA: Diagnosis not present

## 2015-04-22 DIAGNOSIS — G5602 Carpal tunnel syndrome, left upper limb: Secondary | ICD-10-CM | POA: Diagnosis not present

## 2015-04-22 DIAGNOSIS — M65331 Trigger finger, right middle finger: Secondary | ICD-10-CM | POA: Diagnosis not present

## 2015-05-06 ENCOUNTER — Ambulatory Visit (INDEPENDENT_AMBULATORY_CARE_PROVIDER_SITE_OTHER): Payer: Medicare Other | Admitting: *Deleted

## 2015-05-06 DIAGNOSIS — I255 Ischemic cardiomyopathy: Secondary | ICD-10-CM | POA: Diagnosis not present

## 2015-05-06 DIAGNOSIS — I5042 Chronic combined systolic (congestive) and diastolic (congestive) heart failure: Secondary | ICD-10-CM | POA: Diagnosis not present

## 2015-05-07 NOTE — Progress Notes (Signed)
Remote ICD transmission.   

## 2015-05-08 DIAGNOSIS — R339 Retention of urine, unspecified: Secondary | ICD-10-CM | POA: Diagnosis not present

## 2015-05-10 ENCOUNTER — Emergency Department (HOSPITAL_COMMUNITY)
Admission: EM | Admit: 2015-05-10 | Discharge: 2015-05-10 | Disposition: A | Discharge status: Home / Self Care | Payer: Medicare Other | Attending: Emergency Medicine | Admitting: Emergency Medicine

## 2015-05-10 ENCOUNTER — Encounter (HOSPITAL_COMMUNITY): Payer: Self-pay | Admitting: *Deleted

## 2015-05-10 DIAGNOSIS — G8929 Other chronic pain: Secondary | ICD-10-CM | POA: Insufficient documentation

## 2015-05-10 DIAGNOSIS — Z950 Presence of cardiac pacemaker: Secondary | ICD-10-CM | POA: Diagnosis not present

## 2015-05-10 DIAGNOSIS — K5900 Constipation, unspecified: Secondary | ICD-10-CM | POA: Diagnosis not present

## 2015-05-10 DIAGNOSIS — L089 Local infection of the skin and subcutaneous tissue, unspecified: Secondary | ICD-10-CM | POA: Diagnosis not present

## 2015-05-10 DIAGNOSIS — E780 Pure hypercholesterolemia: Secondary | ICD-10-CM | POA: Diagnosis not present

## 2015-05-10 DIAGNOSIS — I5042 Chronic combined systolic (congestive) and diastolic (congestive) heart failure: Secondary | ICD-10-CM | POA: Insufficient documentation

## 2015-05-10 DIAGNOSIS — Z87448 Personal history of other diseases of urinary system: Secondary | ICD-10-CM | POA: Diagnosis not present

## 2015-05-10 DIAGNOSIS — E119 Type 2 diabetes mellitus without complications: Secondary | ICD-10-CM | POA: Insufficient documentation

## 2015-05-10 DIAGNOSIS — Z79899 Other long term (current) drug therapy: Secondary | ICD-10-CM | POA: Insufficient documentation

## 2015-05-10 DIAGNOSIS — I10 Essential (primary) hypertension: Secondary | ICD-10-CM | POA: Diagnosis not present

## 2015-05-10 DIAGNOSIS — F419 Anxiety disorder, unspecified: Secondary | ICD-10-CM | POA: Insufficient documentation

## 2015-05-10 DIAGNOSIS — R21 Rash and other nonspecific skin eruption: Secondary | ICD-10-CM | POA: Diagnosis present

## 2015-05-10 DIAGNOSIS — Z794 Long term (current) use of insulin: Secondary | ICD-10-CM | POA: Insufficient documentation

## 2015-05-10 DIAGNOSIS — Z7982 Long term (current) use of aspirin: Secondary | ICD-10-CM | POA: Insufficient documentation

## 2015-05-10 DIAGNOSIS — F329 Major depressive disorder, single episode, unspecified: Secondary | ICD-10-CM | POA: Diagnosis not present

## 2015-05-10 DIAGNOSIS — K219 Gastro-esophageal reflux disease without esophagitis: Secondary | ICD-10-CM | POA: Diagnosis not present

## 2015-05-10 DIAGNOSIS — L259 Unspecified contact dermatitis, unspecified cause: Secondary | ICD-10-CM | POA: Insufficient documentation

## 2015-05-10 LAB — CUP PACEART REMOTE DEVICE CHECK
Battery Remaining Longevity: 65 mo
Battery Voltage: 2.99 V
Brady Statistic AP VP Percent: 0.02 %
Brady Statistic AP VS Percent: 0.01 %
Brady Statistic AS VP Percent: 98.07 %
Brady Statistic AS VS Percent: 1.9 %
Brady Statistic RA Percent Paced: 0.03 %
Brady Statistic RV Percent Paced: 32.79 %
Date Time Interrogation Session: 20160822125327
HighPow Impedance: 72 Ohm
Lead Channel Impedance Value: 361 Ohm
Lead Channel Impedance Value: 361 Ohm
Lead Channel Impedance Value: 456 Ohm
Lead Channel Impedance Value: 513 Ohm
Lead Channel Impedance Value: 608 Ohm
Lead Channel Impedance Value: 722 Ohm
Lead Channel Pacing Threshold Amplitude: 0.625 V
Lead Channel Pacing Threshold Amplitude: 0.75 V
Lead Channel Pacing Threshold Amplitude: 1.375 V
Lead Channel Pacing Threshold Pulse Width: 0.4 ms
Lead Channel Pacing Threshold Pulse Width: 0.4 ms
Lead Channel Pacing Threshold Pulse Width: 0.8 ms
Lead Channel Sensing Intrinsic Amplitude: 15.5 mV
Lead Channel Sensing Intrinsic Amplitude: 18.125 mV
Lead Channel Sensing Intrinsic Amplitude: 2.625 mV
Lead Channel Sensing Intrinsic Amplitude: 2.625 mV
Lead Channel Setting Pacing Amplitude: 2 V
Lead Channel Setting Pacing Amplitude: 2.5 V
Lead Channel Setting Pacing Amplitude: 2.5 V
Lead Channel Setting Pacing Pulse Width: 0.4 ms
Lead Channel Setting Pacing Pulse Width: 0.8 ms
Lead Channel Setting Sensing Sensitivity: 0.3 mV
Zone Setting Detection Interval: 300 ms
Zone Setting Detection Interval: 350 ms
Zone Setting Detection Interval: 350 ms
Zone Setting Detection Interval: 360 ms

## 2015-05-10 MED ORDER — PREDNISONE 10 MG PO TABS: ORAL_TABLET | ORAL | Status: DC

## 2015-05-10 MED ORDER — SULFAMETHOXAZOLE-TRIMETHOPRIM 800-160 MG PO TABS: 1.0000 | ORAL_TABLET | Freq: Two times a day (BID) | ORAL | Status: AC

## 2015-05-10 NOTE — Discharge Instructions (Signed)
Contact Dermatitis Contact dermatitis is a rash that happens when something touches the skin. You touched something that irritates your skin, or you have allergies to something you touched. HOME CARE   Avoid the thing that caused your rash.  Keep your rash away from hot water, soap, sunlight, chemicals, and other things that might bother it.  Do not scratch your rash.  You can take cool baths to help stop itching.  Only take medicine as told by your doctor.  Keep all doctor visits as told. GET HELP RIGHT AWAY IF:   Your rash is not better after 3 days.  Your rash gets worse.  Your rash is puffy (swollen), tender, red, sore, or warm.  You have problems with your medicine. MAKE SURE YOU:   Understand these instructions.  Will watch your condition.  Will get help right away if you are not doing well or get worse. Document Released: 06/28/2009 Document Revised: 11/23/2011 Document Reviewed: 02/03/2011 [facility] Patient Information 2015 [place], [place]. This information is not intended to replace advice given to you by your health care provider. Make sure you discuss any questions you have with your health care provider.  Facial Infection You have an infection of your face. This requires special attention to help prevent serious problems. Infections in facial wounds can cause poor healing and scars. They can also spread to deeper tissues, especially around the eye. Wound and dental infections can lead to sinusitis, infection of the eye socket, and even meningitis. Permanent damage to the skin, eye, and nervous system may result if facial infections are not treated properly. With severe infections, hospital care for IV antibiotic injections may be needed if they don't respond to oral antibiotics. Antibiotics must be taken for the full course to insure the infection is eliminated. If the infection came from a bad tooth, it may have to be extracted when the infection is under control. Warm  compresses may be applied to reduce skin irritation and remove drainage. You might need a tetanus shot now if:  You cannot remember when your last tetanus shot was.  You have never had a tetanus shot.  The object that caused your wound was dirty. If you need a tetanus shot, and you decide not to get one, there is a rare chance of getting tetanus. Sickness from tetanus can be serious. If you got a tetanus shot, your arm may swell, get red and warm to the touch at the shot site. This is common and not a problem. SEEK IMMEDIATE MEDICAL CARE IF:   You have increased swelling, redness, or trouble breathing.  You have a severe headache, dizziness, nausea, or vomiting.  You develop problems with your eyesight.  You have a fever. Document Released: 10/08/2004 Document Revised: 11/23/2011 Document Reviewed: 08/31/2005 [facility] Patient Information 2015 [place], [place]. This information is not intended to replace advice given to you by your health care provider. Make sure you discuss any questions you have with your health care provider.  Facial Infection You have an infection of your face. This requires special attention to help prevent serious problems. Infections in facial wounds can cause poor healing and scars. They can also spread to deeper tissues, especially around the eye. Wound and dental infections can lead to sinusitis, infection of the eye socket, and even meningitis. Permanent damage to the skin, eye, and nervous system may result if facial infections are not treated properly. With severe infections, hospital care for IV antibiotic injections may be needed if they don't  respond to oral antibiotics. Antibiotics must be taken for the full course to insure the infection is eliminated. If the infection came from a bad tooth, it may have to be extracted when the infection is under control. Warm compresses may be applied to reduce skin irritation and remove drainage. You might need a tetanus  shot now if:  You cannot remember when your last tetanus shot was.  You have never had a tetanus shot.  The object that caused your wound was dirty. If you need a tetanus shot, and you decide not to get one, there is a rare chance of getting tetanus. Sickness from tetanus can be serious. If you got a tetanus shot, your arm may swell, get red and warm to the touch at the shot site. This is common and not a problem. SEEK IMMEDIATE MEDICAL CARE IF:   You have increased swelling, redness, or trouble breathing.  You have a severe headache, dizziness, nausea, or vomiting.  You develop problems with your eyesight.  You have a fever. Document Released: 10/08/2004 Document Revised: 11/23/2011 Document Reviewed: 08/31/2005 [facility] Patient Information 2015 [place], [place]. This information is not intended to replace advice given to you by your health care provider. Make sure you discuss any questions you have with your health care provider.

## 2015-05-10 NOTE — ED Provider Notes (Signed)
CSN: [id number]     Arrival date & time 05/10/15  1529 History   First MD Initiated Contact with Patient 05/10/15 1538     Chief Complaint  Patient presents with  . Insect Bite     (Consider location/radiation/quality/duration/timing/severity/associated sxs/prior Treatment) HPI Comments: Pt comes in with c/o rash to the left jaw. Denies problems swallowing or breathing. She states that she thought she was bit and scratched that area because it is very itchy. Denies fever. No pain. She states that the more red it is the more discomfort she is has. He states that she also has a small area on the left arm. Pt states that she may have been exposed to poison ivy/oak  The history is provided by the patient. No language interpreter was used.    Past Medical History  Diagnosis Date  . Chronic constipation   . Cardiomyopathy   . HTN (hypertension)   . Heart failure     Chronic combined  . Hypercholesteremia   . GERD (gastroesophageal reflux disease)   . Depression   . Neurogenic bladder     2008 nerve damage s/p back ssurgery  . Type II diabetes mellitus   . Chronic lower back pain   . Anxiety   . Constipation 01/30/2015  . Cervical high risk HPV (human papillomavirus) test positive 01/30/2015   Past Surgical History  Procedure Laterality Date  . Cesarean section  1981  . Back surgery  2005  . 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 Upper gastrointestinal endoscopy  MAY 2011 w/ PROP    MILD GASTRITIS 2o ETOH  . Cholecystectomy    . Achilles tendon surgery Right 06/2012  . Gastrocnemius recession  06/24/2012    Procedure: GASTROCNEMIUS SLIDE;  Surgeon: Colin Rhein, MD;  Location: [facility];  Service: Orthopedics;  Laterality: Right;  . Bi-ventricular pacemaker upgrade  12/18/2013    w/defibrillator  . Tubal ligation  ~ 1984  . Breast biopsy Left 1973    benign  . Breast lumpectomy Left 1973  . Posterior lumbar fusion  2006; 2008  . Pacemaker insertion   2006    Dual chamber MDT pacemaker implanted - subsequent upgrade to CRTD 12-2013  . Hernia repair    . Bi-ventricular implantable cardioverter defibrillator  (crt-d)  12-18-2013    upgrade of previously implanted dual chamber pacemaker to MDT CRTD with RV lead revision by Dr Lovena Le  . Biv pacemaker generator change out N/A 12/18/2013    Procedure: BIV PACEMAKER GENERATOR CHANGE OUT;  Surgeon: Evans Lance, MD;  Location: [facility] CATH LAB;  Service: Cardiovascular;  Laterality: N/A;  . Lead revision N/A 12/18/2013    Procedure: LEAD REVISION;  Surgeon: Evans Lance, MD;  Location: [facility] CATH LAB;  Service: Cardiovascular;  Laterality: N/A;   Family History  Problem Relation Age of Onset  . Colon cancer Neg Hx   . Colon polyps Neg Hx   . Alzheimer's disease Mother   . Diabetes Mother   . Cancer Father   . Cancer Sister     breast  . Diabetes Sister   . Diabetes Brother    Social History  Substance Use Topics  . Smoking status: Never Smoker   . Smokeless tobacco: Never Used  . Alcohol Use: Yes     Comment: drinks beer or wine three times a week   OB History    Gravida Para Term Preterm AB TAB SAB Ectopic Multiple Living  1 1             Review of Systems  All other systems reviewed and are negative.     Allergies  Review of patient's allergies indicates no known allergies.  Home Medications   Prior to Admission medications   Medication Sig Start Date End Date Taking? Authorizing Provider  Artificial Tear Solution (SYSTANE CONTACTS OP) Place 1 drop into both eyes 2 (two) times daily.    Historical Provider, MD  aspirin EC 81 MG tablet Take 81 mg by mouth daily.    Historical Provider, MD  atorvastatin (LIPITOR) 40 MG tablet Take 40 mg by mouth every morning.     Historical Provider, MD  buPROPion (WELLBUTRIN XL) 150 MG 24 hr tablet Take 150 mg by mouth every morning.    Historical Provider, MD  carvedilol (COREG) 6.25 MG tablet TAKE ONE TABLET BY MOUTH TWICE A DAY WITH A MEAL  02/20/15   Evans Lance, MD  clonazePAM (KLONOPIN) 0.5 MG tablet Take 1 mg by mouth daily as needed for anxiety.     Historical Provider, MD  Cyanocobalamin (B-12 PO) Take 1 tablet by mouth daily.    Historical Provider, MD  digoxin (LANOXIN) 0.125 MG tablet Take 1 tablet (125 mcg total) by mouth daily. 06/14/12   Evans Lance, MD  furosemide (LASIX) 40 MG tablet Take 3 tablets (120 mg total) by mouth daily. 02/20/15   Evans Lance, MD  HYDROcodone-acetaminophen (NORCO/VICODIN) 5-325 MG per tablet Take 1-2 tablets by mouth every 6 (six) hours as needed. 08/13/14   Fredia Sorrow, MD  insulin detemir (LEVEMIR) 100 UNIT/ML injection Inject 30 Units into the skin at bedtime.     Historical Provider, MD  losartan (COZAAR) 100 MG tablet Take 100 mg by mouth every morning.  05/22/13   Historical Provider, MD  lubiprostone (AMITIZA) 24 MCG capsule Take 1 capsule (24 mcg total) by mouth daily with breakfast. 01/30/15   Estill Dooms, NP  metFORMIN (GLUCOPHAGE) 500 MG tablet Take 1,000 mg by mouth 2 (two) times daily with a meal.     Historical Provider, MD  omeprazole (PRILOSEC) 20 MG capsule Take 20 mg by mouth 2 (two) times daily.     Historical Provider, MD  potassium chloride SA (K-DUR,KLOR-CON) 20 MEQ tablet Take 20 mEq by mouth 2 (two) times daily.     Historical Provider, MD  venlafaxine (EFFEXOR-XR) 150 MG 24 hr capsule Take 300 mg by mouth every morning.     Historical Provider, MD  zolpidem (AMBIEN) 10 MG tablet Take 20 mg by mouth at bedtime.     Historical Provider, MD   BP 138/62 mmHg  Pulse 87  Temp(Src) 97.5 F (36.4 C) (Oral)  Resp 18  Ht 5\' 2"  (1.575 m)  Wt 137 lb (62.143 kg)  BMI 25.05 kg/m2  SpO2 100% Physical Exam  Constitutional: She is oriented to person, place, and time. She appears well-developed and well-nourished.  HENT:  Right Ear: External ear normal.  Left Ear: External ear normal.  Mouth/Throat: Oropharynx is clear and moist.  Cardiovascular: Normal rate and  regular rhythm.   Pulmonary/Chest: Effort normal and breath sounds normal.  Neurological: She is alert and oriented to person, place, and time.  Skin:  Red raised papules to the left jaw with localized redness around the area.  Nursing note and vitals reviewed.   ED Course  Procedures (including critical care time) Labs Review Labs Reviewed - No data to display  Imaging  Review No results found. I have personally reviewed and evaluated these images and lab results as part of my medical decision-making.   EKG Interpretation None      MDM   Final diagnoses:  Contact dermatitis  Skin infection   Exam consistent with a contact dermatitis that has gotten a little infected. Area is localized. Will put on prednisone and bactrim. Discussed return precaution with pt    Glendell Docker, NP 05/10/15 1601  Merrily Pew, MD 05/10/15 1840

## 2015-05-10 NOTE — ED Notes (Signed)
Pt with red area to left jaw, denies trouble swallowing or SOB, c/o itching

## 2015-05-15 ENCOUNTER — Encounter: Payer: Self-pay | Admitting: Cardiology

## 2015-05-17 DIAGNOSIS — M65331 Trigger finger, right middle finger: Secondary | ICD-10-CM | POA: Diagnosis not present

## 2015-05-17 DIAGNOSIS — G5602 Carpal tunnel syndrome, left upper limb: Secondary | ICD-10-CM | POA: Diagnosis not present

## 2015-05-21 ENCOUNTER — Encounter: Payer: Self-pay | Admitting: Internal Medicine

## 2015-05-29 DIAGNOSIS — Z23 Encounter for immunization: Secondary | ICD-10-CM | POA: Diagnosis not present

## 2015-05-29 DIAGNOSIS — F329 Major depressive disorder, single episode, unspecified: Secondary | ICD-10-CM | POA: Diagnosis not present

## 2015-05-29 DIAGNOSIS — E1165 Type 2 diabetes mellitus with hyperglycemia: Secondary | ICD-10-CM | POA: Diagnosis not present

## 2015-05-29 DIAGNOSIS — I10 Essential (primary) hypertension: Secondary | ICD-10-CM | POA: Diagnosis not present

## 2015-05-29 DIAGNOSIS — E782 Mixed hyperlipidemia: Secondary | ICD-10-CM | POA: Diagnosis not present

## 2015-05-29 DIAGNOSIS — G5602 Carpal tunnel syndrome, left upper limb: Secondary | ICD-10-CM | POA: Diagnosis not present

## 2015-05-29 DIAGNOSIS — M545 Low back pain: Secondary | ICD-10-CM | POA: Diagnosis not present

## 2015-06-06 DIAGNOSIS — E119 Type 2 diabetes mellitus without complications: Secondary | ICD-10-CM | POA: Diagnosis not present

## 2015-06-07 DIAGNOSIS — I10 Essential (primary) hypertension: Secondary | ICD-10-CM | POA: Diagnosis not present

## 2015-06-07 DIAGNOSIS — E782 Mixed hyperlipidemia: Secondary | ICD-10-CM | POA: Diagnosis not present

## 2015-06-07 DIAGNOSIS — M545 Low back pain: Secondary | ICD-10-CM | POA: Diagnosis not present

## 2015-06-07 DIAGNOSIS — F329 Major depressive disorder, single episode, unspecified: Secondary | ICD-10-CM | POA: Diagnosis not present

## 2015-06-07 DIAGNOSIS — E1165 Type 2 diabetes mellitus with hyperglycemia: Secondary | ICD-10-CM | POA: Diagnosis not present

## 2015-06-24 ENCOUNTER — Other Ambulatory Visit: Payer: Self-pay | Admitting: Obstetrics and Gynecology

## 2015-06-24 DIAGNOSIS — Z1231 Encounter for screening mammogram for malignant neoplasm of breast: Secondary | ICD-10-CM

## 2015-06-25 ENCOUNTER — Other Ambulatory Visit: Payer: Self-pay | Admitting: Internal Medicine

## 2015-06-28 ENCOUNTER — Ambulatory Visit: Payer: Medicare Other | Admitting: "Endocrinology

## 2015-07-03 DIAGNOSIS — E119 Type 2 diabetes mellitus without complications: Secondary | ICD-10-CM | POA: Diagnosis not present

## 2015-07-08 DIAGNOSIS — E119 Type 2 diabetes mellitus without complications: Secondary | ICD-10-CM | POA: Diagnosis not present

## 2015-07-30 ENCOUNTER — Telehealth: Payer: Self-pay | Admitting: Internal Medicine

## 2015-07-30 ENCOUNTER — Other Ambulatory Visit: Payer: Self-pay | Admitting: "Endocrinology

## 2015-07-30 NOTE — Telephone Encounter (Signed)
New Message    Pt calling stating she is having some problems right now and doesn't know if it is something with her device or if her medications need to be changed. Pt was unable to elaborate while on the phone when she began speaking about her mother's recent passing and some other family issues going on. Please call back and advise.

## 2015-07-31 NOTE — Telephone Encounter (Addendum)
Called and spoke with patient, she is currently in [place].  Her mom died on Halloween and her younger brother who is 34 has stage 4 CA of the brain.  She hs been very upset and says she was out of her Digoxin for 3 days but is back on it now.  She mainly called to talk.  Very emotional and does not know when she will be coming home.  She will still send her transmission on 08/07/15

## 2015-08-02 ENCOUNTER — Ambulatory Visit (HOSPITAL_COMMUNITY): Payer: Medicare Other

## 2015-08-02 DIAGNOSIS — R32 Unspecified urinary incontinence: Secondary | ICD-10-CM | POA: Diagnosis not present

## 2015-08-07 ENCOUNTER — Encounter: Payer: Medicare Other | Admitting: *Deleted

## 2015-08-07 ENCOUNTER — Telehealth: Payer: Self-pay | Admitting: Cardiology

## 2015-08-07 NOTE — Telephone Encounter (Signed)
Spoke with pt and reminded pt of remote transmission that is due today. Pt verbalized understanding.   

## 2015-08-12 ENCOUNTER — Other Ambulatory Visit: Payer: Self-pay | Admitting: "Endocrinology

## 2015-08-12 ENCOUNTER — Encounter: Payer: Self-pay | Admitting: Cardiology

## 2015-08-12 DIAGNOSIS — E1069 Type 1 diabetes mellitus with other specified complication: Principal | ICD-10-CM

## 2015-08-12 DIAGNOSIS — IMO0002 Reserved for concepts with insufficient information to code with codable children: Secondary | ICD-10-CM

## 2015-08-12 DIAGNOSIS — E1065 Type 1 diabetes mellitus with hyperglycemia: Secondary | ICD-10-CM

## 2015-08-14 ENCOUNTER — Ambulatory Visit (INDEPENDENT_AMBULATORY_CARE_PROVIDER_SITE_OTHER): Payer: Medicare Other | Admitting: *Deleted

## 2015-08-14 DIAGNOSIS — I255 Ischemic cardiomyopathy: Secondary | ICD-10-CM | POA: Diagnosis not present

## 2015-08-14 DIAGNOSIS — I5042 Chronic combined systolic (congestive) and diastolic (congestive) heart failure: Secondary | ICD-10-CM

## 2015-08-14 DIAGNOSIS — I5022 Chronic systolic (congestive) heart failure: Secondary | ICD-10-CM

## 2015-08-15 DIAGNOSIS — E538 Deficiency of other specified B group vitamins: Secondary | ICD-10-CM | POA: Diagnosis not present

## 2015-08-15 DIAGNOSIS — D225 Melanocytic nevi of trunk: Secondary | ICD-10-CM | POA: Diagnosis not present

## 2015-08-15 DIAGNOSIS — L259 Unspecified contact dermatitis, unspecified cause: Secondary | ICD-10-CM | POA: Diagnosis not present

## 2015-08-16 DIAGNOSIS — H5203 Hypermetropia, bilateral: Secondary | ICD-10-CM | POA: Diagnosis not present

## 2015-08-16 DIAGNOSIS — H52229 Regular astigmatism, unspecified eye: Secondary | ICD-10-CM | POA: Diagnosis not present

## 2015-08-16 DIAGNOSIS — H524 Presbyopia: Secondary | ICD-10-CM | POA: Diagnosis not present

## 2015-08-16 DIAGNOSIS — H25013 Cortical age-related cataract, bilateral: Secondary | ICD-10-CM | POA: Diagnosis not present

## 2015-08-16 NOTE — Progress Notes (Signed)
Remote ICD transmission.   

## 2015-08-21 ENCOUNTER — Other Ambulatory Visit: Payer: Self-pay | Admitting: Obstetrics and Gynecology

## 2015-08-21 ENCOUNTER — Telehealth: Payer: Self-pay | Admitting: Internal Medicine

## 2015-08-21 DIAGNOSIS — Z1231 Encounter for screening mammogram for malignant neoplasm of breast: Secondary | ICD-10-CM

## 2015-08-21 NOTE — Telephone Encounter (Signed)
Informed pt that her transmission was received. She verbalized understanding.

## 2015-08-21 NOTE — Telephone Encounter (Signed)
New Prob    Pt calling to verify her transmission was received. Please call.

## 2015-08-22 DIAGNOSIS — E1065 Type 1 diabetes mellitus with hyperglycemia: Secondary | ICD-10-CM | POA: Diagnosis not present

## 2015-08-22 DIAGNOSIS — E1069 Type 1 diabetes mellitus with other specified complication: Secondary | ICD-10-CM | POA: Diagnosis not present

## 2015-08-23 LAB — COMPREHENSIVE METABOLIC PANEL
ALBUMIN: 4.2 g/dL (ref 3.6–5.1)
ALK PHOS: 74 U/L (ref 33–130)
ALT: 24 U/L (ref 6–29)
AST: 23 U/L (ref 10–35)
BUN: 22 mg/dL (ref 7–25)
CHLORIDE: 102 mmol/L (ref 98–110)
CO2: 27 mmol/L (ref 20–31)
Calcium: 9.2 mg/dL (ref 8.6–10.4)
Creat: 1 mg/dL — ABNORMAL HIGH (ref 0.50–0.99)
Glucose, Bld: 146 mg/dL — ABNORMAL HIGH (ref 65–99)
Potassium: 5.1 mmol/L (ref 3.5–5.3)
SODIUM: 140 mmol/L (ref 135–146)
TOTAL PROTEIN: 6.8 g/dL (ref 6.1–8.1)
Total Bilirubin: 0.4 mg/dL (ref 0.2–1.2)

## 2015-08-23 LAB — HEMOGLOBIN A1C
Hgb A1c MFr Bld: 7.5 % — ABNORMAL HIGH (ref <5.7)
MEAN PLASMA GLUCOSE: 169 mg/dL — AB (ref <117)

## 2015-08-26 LAB — CUP PACEART REMOTE DEVICE CHECK
Battery Voltage: 2.97 V
Brady Statistic AP VS Percent: 0.01 %
Brady Statistic AS VP Percent: 98.11 %
Brady Statistic RA Percent Paced: 0.03 %
Date Time Interrogation Session: 20161201045159
HIGH POWER IMPEDANCE MEASURED VALUE: 73 Ohm
Implantable Lead Implant Date: 20150406
Implantable Lead Location: 753860
Implantable Lead Model: 5076
Lead Channel Impedance Value: 342 Ohm
Lead Channel Impedance Value: 399 Ohm
Lead Channel Impedance Value: 475 Ohm
Lead Channel Impedance Value: 608 Ohm
Lead Channel Pacing Threshold Amplitude: 0.625 V
Lead Channel Pacing Threshold Pulse Width: 0.4 ms
Lead Channel Pacing Threshold Pulse Width: 0.4 ms
Lead Channel Pacing Threshold Pulse Width: 0.8 ms
Lead Channel Sensing Intrinsic Amplitude: 17.75 mV
Lead Channel Sensing Intrinsic Amplitude: 17.75 mV
Lead Channel Setting Pacing Amplitude: 2 V
Lead Channel Setting Pacing Amplitude: 2.5 V
Lead Channel Setting Pacing Pulse Width: 0.8 ms
Lead Channel Setting Sensing Sensitivity: 0.3 mV
MDC IDC LEAD IMPLANT DT: 20060214
MDC IDC LEAD IMPLANT DT: 20150406
MDC IDC LEAD LOCATION: 753858
MDC IDC LEAD LOCATION: 753859
MDC IDC LEAD MODEL: 4396
MDC IDC MSMT BATTERY REMAINING LONGEVITY: 64 mo
MDC IDC MSMT LEADCHNL LV IMPEDANCE VALUE: 703 Ohm
MDC IDC MSMT LEADCHNL LV PACING THRESHOLD AMPLITUDE: 1 V
MDC IDC MSMT LEADCHNL RA SENSING INTR AMPL: 2.5 mV
MDC IDC MSMT LEADCHNL RA SENSING INTR AMPL: 2.5 mV
MDC IDC MSMT LEADCHNL RV IMPEDANCE VALUE: 475 Ohm
MDC IDC MSMT LEADCHNL RV PACING THRESHOLD AMPLITUDE: 0.625 V
MDC IDC SET LEADCHNL RA PACING AMPLITUDE: 2 V
MDC IDC SET LEADCHNL RV PACING PULSEWIDTH: 0.4 ms
MDC IDC STAT BRADY AP VP PERCENT: 0.01 %
MDC IDC STAT BRADY AS VS PERCENT: 1.86 %
MDC IDC STAT BRADY RV PERCENT PACED: 35.34 %

## 2015-08-27 ENCOUNTER — Encounter: Payer: Self-pay | Admitting: Cardiology

## 2015-08-28 ENCOUNTER — Ambulatory Visit (HOSPITAL_COMMUNITY)
Admission: RE | Admit: 2015-08-28 | Discharge: 2015-08-28 | Disposition: A | Discharge status: Home / Self Care | Payer: Medicare Other | Source: Ambulatory Visit | Attending: Obstetrics and Gynecology | Admitting: Obstetrics and Gynecology

## 2015-08-28 DIAGNOSIS — Z1231 Encounter for screening mammogram for malignant neoplasm of breast: Secondary | ICD-10-CM

## 2015-08-29 DIAGNOSIS — I420 Dilated cardiomyopathy: Secondary | ICD-10-CM | POA: Diagnosis not present

## 2015-08-29 DIAGNOSIS — E1165 Type 2 diabetes mellitus with hyperglycemia: Secondary | ICD-10-CM | POA: Diagnosis not present

## 2015-08-29 DIAGNOSIS — M545 Low back pain: Secondary | ICD-10-CM | POA: Diagnosis not present

## 2015-08-29 DIAGNOSIS — I10 Essential (primary) hypertension: Secondary | ICD-10-CM | POA: Diagnosis not present

## 2015-09-02 DIAGNOSIS — E119 Type 2 diabetes mellitus without complications: Secondary | ICD-10-CM | POA: Diagnosis not present

## 2015-09-12 ENCOUNTER — Ambulatory Visit (INDEPENDENT_AMBULATORY_CARE_PROVIDER_SITE_OTHER): Payer: Medicare Other | Admitting: "Endocrinology

## 2015-09-12 ENCOUNTER — Encounter: Payer: Self-pay | Admitting: "Endocrinology

## 2015-09-12 VITALS — BP 132/77 | HR 88 | Ht 62.0 in | Wt 142.0 lb

## 2015-09-12 DIAGNOSIS — IMO0002 Reserved for concepts with insufficient information to code with codable children: Secondary | ICD-10-CM

## 2015-09-12 DIAGNOSIS — E118 Type 2 diabetes mellitus with unspecified complications: Secondary | ICD-10-CM | POA: Diagnosis not present

## 2015-09-12 DIAGNOSIS — E1165 Type 2 diabetes mellitus with hyperglycemia: Secondary | ICD-10-CM

## 2015-09-12 DIAGNOSIS — E785 Hyperlipidemia, unspecified: Secondary | ICD-10-CM | POA: Diagnosis not present

## 2015-09-12 DIAGNOSIS — I10 Essential (primary) hypertension: Secondary | ICD-10-CM | POA: Diagnosis not present

## 2015-09-12 DIAGNOSIS — Z794 Long term (current) use of insulin: Secondary | ICD-10-CM

## 2015-09-12 MED ORDER — INSULIN DETEMIR 100 UNIT/ML FLEXPEN: 34.0000 [IU] | PEN_INJECTOR | Freq: Every day | SUBCUTANEOUS | Status: DC

## 2015-09-12 MED ORDER — METFORMIN HCL 500 MG PO TABS: 500.0000 mg | ORAL_TABLET | Freq: Two times a day (BID) | ORAL | Status: DC
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
## 2015-09-12 NOTE — Patient Instructions (Signed)

## 2015-09-17 DIAGNOSIS — E538 Deficiency of other specified B group vitamins: Secondary | ICD-10-CM | POA: Diagnosis not present

## 2015-10-11 ENCOUNTER — Telehealth: Payer: Self-pay | Admitting: Internal Medicine

## 2015-10-11 NOTE — Telephone Encounter (Signed)
Pt has pain from neck to elbow,denies injury.I spoke with Tobin Chad in device clinic and she states she had ICD implanted in 2015 and this is not an issue. I encouraged her to see pcp and she agrees

## 2015-10-11 NOTE — Telephone Encounter (Signed)
Patient states that she has defibilator and her left arm has been going numb. Wanted to know if this could be related. / tg

## 2015-10-23 DIAGNOSIS — E538 Deficiency of other specified B group vitamins: Secondary | ICD-10-CM | POA: Diagnosis not present

## 2015-11-13 ENCOUNTER — Ambulatory Visit: Payer: Medicare Other | Admitting: *Deleted

## 2015-11-13 DIAGNOSIS — I5042 Chronic combined systolic (congestive) and diastolic (congestive) heart failure: Secondary | ICD-10-CM | POA: Diagnosis not present

## 2015-11-13 DIAGNOSIS — E119 Type 2 diabetes mellitus without complications: Secondary | ICD-10-CM | POA: Diagnosis not present

## 2015-11-13 DIAGNOSIS — E538 Deficiency of other specified B group vitamins: Secondary | ICD-10-CM | POA: Diagnosis not present

## 2015-11-13 DIAGNOSIS — I255 Ischemic cardiomyopathy: Secondary | ICD-10-CM | POA: Diagnosis not present

## 2015-11-13 NOTE — Progress Notes (Signed)
Remote ICD transmission.   

## 2015-11-28 DIAGNOSIS — R55 Syncope and collapse: Secondary | ICD-10-CM | POA: Diagnosis not present

## 2015-11-28 DIAGNOSIS — N390 Urinary tract infection, site not specified: Secondary | ICD-10-CM | POA: Diagnosis not present

## 2015-11-28 DIAGNOSIS — I10 Essential (primary) hypertension: Secondary | ICD-10-CM | POA: Diagnosis not present

## 2015-11-28 DIAGNOSIS — E1165 Type 2 diabetes mellitus with hyperglycemia: Secondary | ICD-10-CM | POA: Diagnosis not present

## 2015-11-28 DIAGNOSIS — I420 Dilated cardiomyopathy: Secondary | ICD-10-CM | POA: Diagnosis not present

## 2015-11-29 ENCOUNTER — Telehealth: Payer: Self-pay | Admitting: Internal Medicine

## 2015-11-29 DIAGNOSIS — E1165 Type 2 diabetes mellitus with hyperglycemia: Secondary | ICD-10-CM | POA: Diagnosis not present

## 2015-11-29 DIAGNOSIS — I420 Dilated cardiomyopathy: Secondary | ICD-10-CM | POA: Diagnosis not present

## 2015-11-29 DIAGNOSIS — I10 Essential (primary) hypertension: Secondary | ICD-10-CM | POA: Diagnosis not present

## 2015-11-29 DIAGNOSIS — R55 Syncope and collapse: Secondary | ICD-10-CM | POA: Diagnosis not present
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
## 2015-11-29 NOTE — Telephone Encounter (Signed)
Device transmission has been reviewed by Debroah Loop, RN who advised that there have been no incidences of irregular rhythm since March 1 and there are no problems with her device.  I called and spoke with Denman George at Dr. Luan Pulling' office and advised her that patient is scheduled for 3/22 with Tommye Standard, PA and that sooner appointment is not needed.  She thanked me for the call. I spoke with patient and advised her of no problems with her device or rhythm and reminded her of appointment date and time.  I advised her to call back prior to next visit with any complaints or concerns. She verbalized understanding and agreement.

## 2015-11-29 NOTE — Telephone Encounter (Signed)
New message   Denman George [phone]   Dr. Luan Pulling office calling for sooner appt/ pt had a Syncope episode hit face on fridge and device did not fire  Gave first available with PA while Dr.Taylor is in the office.

## 2015-12-02 DIAGNOSIS — E119 Type 2 diabetes mellitus without complications: Secondary | ICD-10-CM | POA: Diagnosis not present

## 2015-12-03 ENCOUNTER — Other Ambulatory Visit: Payer: Self-pay | Admitting: "Endocrinology

## 2015-12-03 ENCOUNTER — Other Ambulatory Visit: Payer: Self-pay | Admitting: Internal Medicine

## 2015-12-03 DIAGNOSIS — E118 Type 2 diabetes mellitus with unspecified complications: Secondary | ICD-10-CM | POA: Diagnosis not present

## 2015-12-03 DIAGNOSIS — E1165 Type 2 diabetes mellitus with hyperglycemia: Secondary | ICD-10-CM | POA: Diagnosis not present

## 2015-12-03 DIAGNOSIS — Z794 Long term (current) use of insulin: Secondary | ICD-10-CM | POA: Diagnosis not present

## 2015-12-03 LAB — BASIC METABOLIC PANEL
BUN: 16 mg/dL (ref 7–25)
CO2: 28 mmol/L (ref 20–31)
CREATININE: 1.07 mg/dL — AB (ref 0.50–0.99)
Calcium: 9.4 mg/dL (ref 8.6–10.4)
Chloride: 102 mmol/L (ref 98–110)
Glucose, Bld: 203 mg/dL — ABNORMAL HIGH (ref 65–99)
Potassium: 5.2 mmol/L (ref 3.5–5.3)
Sodium: 140 mmol/L (ref 135–146)

## 2015-12-03 NOTE — Progress Notes (Signed)
Cardiology Office Note Date:  12/04/2015  Patient ID:  Melanie, [name] Jul 21, 1951, MRN [id number] PCP:  Alonza Bogus, MD  Electrophysiologist:  Dr. Lovena Le   Chief Complaint: syncope  History of Present Illness: [name] is a 65 y.o. female with history of NICM, chronic CHF with CRT-D (upgraded from PPM with hx of   The patient is benig seen today for Dr. Lovena Le, she sufferred a syncopal event on 11/17/15  She did a rermote transmission afterwards without any arrhythmias or therapies.  She reports she had gone to bed as usual the evening prior, she had a few beers (about 6) over the course of 6-7 hours that day, did not feel like she was intoxicated.  She had taken her sleeping pill the same she has been on for >10 years, and went to bed.  She does not recall getting up and going to the kitchen, does not recall any of the events, says she is going by what her fried told her.  Apparently her friend woke to find her on the kitchen floor in front of the fridge awake but "out of it" and bleeding from the side of her forehead.   She states she helped her to bed, and cleaned her up, none of which she has independent recall of, and then woke later that morning with pain where she had struck her shoulder and head, but otherwise felt fine.  She did not seek medical attention at that time, outside of calling her PMD office last week who recommended to call here.  She has had no medication changes and taken no extra or unusual medications, she reports drinking plenty of water.  No CP, palpitations, no near syncope or syncope.  She does say on occasion she will feel a little lightheaded.  She has not had any symptoms since, no dizziness, near syncope or syncope no headaches or neurological symptoms.   She suspects her BS may be the culprit noting her BS in the mornings as low as 50 at times and at other times 170, she had labs yesterday for her endocrinologist and is pending her visit with  them.    Past Medical History  Diagnosis Date  . Chronic constipation   . Cardiomyopathy   . HTN (hypertension)   . Heart failure     Chronic combined  . Hypercholesteremia   . GERD (gastroesophageal reflux disease)   . Depression   . Neurogenic bladder     2008 nerve damage s/p back ssurgery  . Type II diabetes mellitus ([place])   . Chronic lower back pain   . Anxiety   . Constipation 01/30/2015  . Cervical high risk HPV (human papillomavirus) test positive 01/30/2015    Past Surgical History  Procedure Laterality Date  . Cesarean section  1981  . Back surgery  2005  . 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 Upper gastrointestinal endoscopy  MAY 2011 w/ PROP    MILD GASTRITIS 2o ETOH  . Cholecystectomy    . Achilles tendon surgery Right 06/2012  . Gastrocnemius recession  06/24/2012    Procedure: GASTROCNEMIUS SLIDE;  Surgeon: Colin Rhein, MD;  Location: [facility];  Service: Orthopedics;  Laterality: Right;  . Bi-ventricular pacemaker upgrade  12/18/2013    w/defibrillator  . Tubal ligation  ~ 1984  . Breast biopsy Left 1973    benign  . Breast lumpectomy Left 1973  . Posterior lumbar fusion  2006;  2008  . Pacemaker insertion  2006    Dual chamber MDT pacemaker implanted - subsequent upgrade to CRTD 12-2013  . Hernia repair    . Bi-ventricular implantable cardioverter defibrillator  (crt-d)  12-18-2013    upgrade of previously implanted dual chamber pacemaker to MDT CRTD with RV lead revision by Dr Lovena Le  . Biv pacemaker generator change out N/A 12/18/2013    Procedure: BIV PACEMAKER GENERATOR CHANGE OUT;  Surgeon: Evans Lance, MD;  Location: [facility] C[facility] LAB;  Service: Cardiovascular;  Laterality: N/A;  . Lead revision N/A 12/18/2013    Procedure: LEAD REVISION;  Surgeon: Evans Lance, MD;  Location: [facility] CATH LAB;  Service: Cardiovascular;  Laterality: N/A;    Current Outpatient Prescriptions  Medication Sig Dispense Refill  . Artificial Tear  Solution (SYSTANE CONTACTS OP) Place 1 drop into both eyes 2 (two) times daily.    [name] aspirin EC 81 MG tablet Take 81 mg by mouth daily.    [name] atorvastatin (LIPITOR) 40 MG tablet Take 40 mg by mouth every morning.     [name] buPROPion (WELLBUTRIN XL) 150 MG 24 hr tablet Take 150 mg by mouth every morning.    . carvedilol (COREG) 6.25 MG tablet TAKE ONE TABLET BY MOUTH TWICE A DAY WITH A MEAL 180 tablet 3  . clonazePAM (KLONOPIN) 0.5 MG tablet Take 1 mg by mouth daily as needed for anxiety.     . Cyanocobalamin (B-12 PO) Take 1 tablet by mouth daily.    . digoxin (LANOXIN) 0.125 MG tablet Take 1 tablet (125 mcg total) by mouth daily. 90 tablet 3  . furosemide (LASIX) 40 MG tablet TAKE THREE TABLETS BY MOUTH ONCE A DAY. 270 tablet 1  . HYDROcodone-acetaminophen (NORCO/VICODIN) 5-325 MG per tablet Take 1-2 tablets by mouth every 6 (six) hours as needed. 10 tablet 0  . Insulin Detemir (LEVEMIR FLEXTOUCH) 100 UNIT/ML Pen Inject 34 Units into the skin daily at 10 pm. 15 mL 2  . losartan (COZAAR) 100 MG tablet Take 100 mg by mouth every morning.     . metFORMIN (GLUCOPHAGE) 500 MG tablet Take 1 tablet (500 mg total) by mouth 2 (two) times daily with a meal. 60 tablet 2  . omeprazole (PRILOSEC) 20 MG capsule Take 20 mg by mouth 2 (two) times daily.     . potassium chloride SA (K-DUR,KLOR-CON) 20 MEQ tablet Take 20 mEq by mouth 2 (two) times daily.     [name] UNIFINE PENTIPS 32G X 4 MM MISC USE ONCE AT BEDTIME 100 each 2  . venlafaxine (EFFEXOR-XR) 150 MG 24 hr capsule Take 300 mg by mouth every morning.     . zolpidem (AMBIEN) 10 MG tablet Take 20 mg by mouth at bedtime.      No current facility-administered medications for this visit.    Allergies:   Review of patient's allergies indicates no known allergies.   Social History:  The patient  reports that she has never smoked. She has never used smokeless tobacco. She reports that she drinks alcohol. She reports that she does not use illicit drugs.   Family  History:  The patient's family history includes Alzheimer's disease in her mother; Cancer in her father and sister; Diabetes in her brother, mother, and sister. There is no history of Colon cancer or Colon polyps.  ROS:  Please see the history of present illness.   All other systems are reviewed and otherwise negative.   PHYSICAL EXAM:  VS:  BP 136/74 mmHg  Pulse 99  Ht 5\' 2"  (1.575 m)  Wt 143 lb (64.864 kg)  BMI 26.15 kg/m2 BMI: Body mass index is 26.15 kg/(m^2). Well nourished, well developed, in no acute distress HEENT: normocephalic, left periorbital ecchymosis, greenish/yellowing in color, left forehead swelling reported as improving slowly Neck: no JVD, carotid bruits or masses Cardiac:  normal S1, S2; RRR; no significant murmurs, no rubs, or gallops Lungs:  clear to auscultation bilaterally, no wheezing, rhonchi or rales Abd: soft, nontender MS: no deformity or atrophy Ext: no edema Skin: warm and dry, no rash Neuro:  No gross deficits appreciated Psych: euthymic mood, full affect  ICD site is stable, no tethering or discomfort   EKG:  Done today shows SR, V paced ICD check today: normal device function, no arrhythmias or therapies, VT monitoring zone adjusted to 150bpm from 171bpm given syncopal event  12/13/13: Echoardiogram Study Conclusions - Left ventricle: The cavity size was normal. Wall thickness was normal. Systolic function was moderately to severely reduced. The estimated ejection fraction was in the range of 30% to 35%. There is hypokinesis of the anteroseptal, inferior, and apical myocardium. Doppler parameters are consistent with abnormal left ventricular relaxation (grade 1 diastolic dysfunction). Doppler parameters are consistent with high ventricular filling pressure. - Mitral valve: Calcified annulus. - Left atrium: The atrium was mildly dilated. - Pulmonary arteries: Systolic pressure was mildly increased. PA peak pressure: 67mm Hg  (S).  Recent Labs: 08/22/2015: ALT 24 12/03/2015: BUN 16; Creat 1.07*; Potassium 5.2; Sodium 140  No results found for requested labs within last 365 days.   Estimated Creatinine Clearance: 47.6 mL/min (by C-G formula based on Cr of 1.07).   Wt Readings from Last 3 Encounters:  12/04/15 143 lb (64.864 kg)  09/12/15 142 lb (64.411 kg)  05/10/15 137 lb (62.143 kg)     Other studies reviewed: Additional studies/records reviewed today include: summarized above  DEVICE information:  MDT CRT-D 12/18/13, Dr. Lovena Le, original device was PPM  ASSESSMENT AND PLAN:  1. Syncope      Device check without any arrhythmias or therapies, normal device function      No significant orthostatic changes, she tells me on lower doses of lasix she has had trouble with fluid OL and is encouraged to keep adequately hydrated watching her weight daily, she says she does and her daily weight has remained very stable with AM weights 136-139lbs       ?ETOH  She is advised of [place] law, no driving for 6 months after syncopal event, she states understanding  2. NICM with CRT-D     exam appears compensated     On  BB/ARB, lasix/K+  3. HTN     Controlled   Disposition: Urged to get evaluation of the traumatic injury, she denies any recurrent symptoms and reports slowly improving and reluctant to have the injury evaluated, but again, encouraged to do so.  She is instructed to F/u with her PMD for further evaluation of her syncope as well as her endocrinologist, discussed with Dr. Lovena Le, to f/u with him at her scheduled visit in 19months, sooner if needed.  Current medicines are reviewed at length with the patient today.  The patient did not have any concerns regarding medicines.  Haywood Lasso, PA-C 12/04/2015 3:37 PM     [address] [place] [phone] (office)  [phone] (fax)

## 2015-12-04 ENCOUNTER — Encounter: Payer: Self-pay | Admitting: Physician Assistant

## 2015-12-04 ENCOUNTER — Ambulatory Visit (INDEPENDENT_AMBULATORY_CARE_PROVIDER_SITE_OTHER): Payer: Medicare Other | Admitting: Physician Assistant

## 2015-12-04 VITALS — BP 136/74 | HR 99 | Ht 62.0 in | Wt 143.0 lb

## 2015-12-04 DIAGNOSIS — R55 Syncope and collapse: Secondary | ICD-10-CM | POA: Diagnosis not present

## 2015-12-04 DIAGNOSIS — I429 Cardiomyopathy, unspecified: Secondary | ICD-10-CM

## 2015-12-04 DIAGNOSIS — R42 Dizziness and giddiness: Secondary | ICD-10-CM | POA: Diagnosis not present

## 2015-12-04 DIAGNOSIS — I10 Essential (primary) hypertension: Secondary | ICD-10-CM

## 2015-12-04 LAB — HEMOGLOBIN A1C
Hgb A1c MFr Bld: 9.1 % — ABNORMAL HIGH (ref <5.7)
MEAN PLASMA GLUCOSE: 214 mg/dL — AB (ref <117)

## 2015-12-04 NOTE — Patient Instructions (Addendum)
Medication Instructions:   Your physician recommends that you continue on your current medications as directed. Please refer to the Current Medication list given to you today.    If you need a refill on your cardiac medications before your next appointment, please call your pharmacy.  Labwork:  NONE ORDER TODAY    Testing/Procedures:  NONE ORDER TODAY    Follow-Up: WITH YOUR PRIMARY CARE DOCTOR. SYNCOPE EPISODES   Any Other Special Instructions Will Be Listed Below (If Applicable).

## 2015-12-05 ENCOUNTER — Ambulatory Visit: Payer: Medicare Other | Admitting: Physician Assistant

## 2015-12-12 ENCOUNTER — Other Ambulatory Visit: Payer: Self-pay | Admitting: Internal Medicine

## 2015-12-12 ENCOUNTER — Ambulatory Visit (INDEPENDENT_AMBULATORY_CARE_PROVIDER_SITE_OTHER): Payer: Medicare Other | Admitting: "Endocrinology

## 2015-12-12 ENCOUNTER — Encounter: Payer: Self-pay | Admitting: "Endocrinology

## 2015-12-12 VITALS — BP 137/79 | HR 76 | Ht 62.0 in | Wt 141.0 lb

## 2015-12-12 DIAGNOSIS — H5201 Hypermetropia, right eye: Secondary | ICD-10-CM | POA: Diagnosis not present

## 2015-12-12 DIAGNOSIS — E118 Type 2 diabetes mellitus with unspecified complications: Secondary | ICD-10-CM | POA: Diagnosis not present

## 2015-12-12 DIAGNOSIS — H52223 Regular astigmatism, bilateral: Secondary | ICD-10-CM | POA: Diagnosis not present

## 2015-12-12 DIAGNOSIS — E785 Hyperlipidemia, unspecified: Secondary | ICD-10-CM

## 2015-12-12 DIAGNOSIS — E1165 Type 2 diabetes mellitus with hyperglycemia: Secondary | ICD-10-CM | POA: Diagnosis not present

## 2015-12-12 DIAGNOSIS — H524 Presbyopia: Secondary | ICD-10-CM | POA: Diagnosis not present

## 2015-12-12 DIAGNOSIS — I10 Essential (primary) hypertension: Secondary | ICD-10-CM | POA: Diagnosis not present

## 2015-12-12 DIAGNOSIS — Z794 Long term (current) use of insulin: Secondary | ICD-10-CM | POA: Diagnosis not present

## 2015-12-12 DIAGNOSIS — D519 Vitamin B12 deficiency anemia, unspecified: Secondary | ICD-10-CM | POA: Diagnosis not present

## 2015-12-12 DIAGNOSIS — IMO0002 Reserved for concepts with insufficient information to code with codable children: Secondary | ICD-10-CM

## 2015-12-12 DIAGNOSIS — H25013 Cortical age-related cataract, bilateral: Secondary | ICD-10-CM | POA: Diagnosis not present

## 2015-12-12 MED ORDER — INSULIN DETEMIR 100 UNIT/ML FLEXPEN: 40.0000 [IU] | PEN_INJECTOR | Freq: Every day | SUBCUTANEOUS | Status: DC

## 2015-12-12 NOTE — Patient Instructions (Signed)
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
## 2015-12-16 ENCOUNTER — Ambulatory Visit: Payer: Medicare Other | Admitting: "Endocrinology

## 2016-01-13 ENCOUNTER — Other Ambulatory Visit: Payer: Self-pay | Admitting: "Endocrinology

## 2016-01-13 DIAGNOSIS — D519 Vitamin B12 deficiency anemia, unspecified: Secondary | ICD-10-CM | POA: Diagnosis not present

## 2016-01-13 NOTE — Telephone Encounter (Signed)
Please check patient's metformin. Pharmacy is saying we filled it for 500mg  but patient has said she takes 1000 twice a day. Please double check

## 2016-01-13 NOTE — Telephone Encounter (Signed)
I want her to be on metformin 500 mg by mouth twice a day. She may have been taking 1000 mg however it was lowered to 500 mg during one of her visits.

## 2016-01-14 NOTE — Telephone Encounter (Signed)
Left voice mail for pt with info

## 2016-01-21 DIAGNOSIS — M65331 Trigger finger, right middle finger: Secondary | ICD-10-CM | POA: Diagnosis not present

## 2016-01-24 ENCOUNTER — Encounter: Payer: Self-pay | Admitting: "Endocrinology

## 2016-01-24 ENCOUNTER — Ambulatory Visit (INDEPENDENT_AMBULATORY_CARE_PROVIDER_SITE_OTHER): Payer: Medicare Other | Admitting: "Endocrinology

## 2016-01-24 VITALS — BP 138/80 | HR 94 | Resp 18 | Ht 62.0 in | Wt 138.0 lb

## 2016-01-24 DIAGNOSIS — E785 Hyperlipidemia, unspecified: Secondary | ICD-10-CM | POA: Diagnosis not present

## 2016-01-24 DIAGNOSIS — Z794 Long term (current) use of insulin: Secondary | ICD-10-CM | POA: Diagnosis not present

## 2016-01-24 DIAGNOSIS — E1165 Type 2 diabetes mellitus with hyperglycemia: Secondary | ICD-10-CM | POA: Diagnosis not present

## 2016-01-24 DIAGNOSIS — E118 Type 2 diabetes mellitus with unspecified complications: Secondary | ICD-10-CM

## 2016-01-24 DIAGNOSIS — IMO0002 Reserved for concepts with insufficient information to code with codable children: Secondary | ICD-10-CM

## 2016-01-24 DIAGNOSIS — I10 Essential (primary) hypertension: Secondary | ICD-10-CM | POA: Diagnosis not present
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
## 2016-01-24 NOTE — Patient Instructions (Signed)

## 2016-02-05 ENCOUNTER — Encounter: Payer: Self-pay | Admitting: Adult Health

## 2016-02-05 ENCOUNTER — Other Ambulatory Visit (HOSPITAL_COMMUNITY)
Admission: RE | Admit: 2016-02-05 | Discharge: 2016-02-05 | Disposition: A | Discharge status: Home / Self Care | Payer: Medicare Other | Source: Ambulatory Visit | Attending: Adult Health | Admitting: Adult Health

## 2016-02-05 ENCOUNTER — Ambulatory Visit (INDEPENDENT_AMBULATORY_CARE_PROVIDER_SITE_OTHER): Payer: Medicare Other | Admitting: Adult Health

## 2016-02-05 VITALS — BP 132/80 | HR 94 | Ht 61.5 in | Wt 139.0 lb

## 2016-02-05 DIAGNOSIS — M545 Low back pain, unspecified: Secondary | ICD-10-CM

## 2016-02-05 DIAGNOSIS — Z1212 Encounter for screening for malignant neoplasm of rectum: Secondary | ICD-10-CM

## 2016-02-05 DIAGNOSIS — R87810 Cervical high risk human papillomavirus (HPV) DNA test positive: Secondary | ICD-10-CM | POA: Diagnosis not present

## 2016-02-05 DIAGNOSIS — Z01419 Encounter for gynecological examination (general) (routine) without abnormal findings: Secondary | ICD-10-CM | POA: Insufficient documentation

## 2016-02-05 DIAGNOSIS — Z01411 Encounter for gynecological examination (general) (routine) with abnormal findings: Secondary | ICD-10-CM

## 2016-02-05 DIAGNOSIS — Z124 Encounter for screening for malignant neoplasm of cervix: Secondary | ICD-10-CM

## 2016-02-05 DIAGNOSIS — F341 Dysthymic disorder: Secondary | ICD-10-CM

## 2016-02-05 DIAGNOSIS — R319 Hematuria, unspecified: Secondary | ICD-10-CM

## 2016-02-05 DIAGNOSIS — Z1151 Encounter for screening for human papillomavirus (HPV): Secondary | ICD-10-CM | POA: Insufficient documentation

## 2016-02-05 LAB — POCT URINALYSIS DIPSTICK
Glucose, UA: NEGATIVE
Nitrite, UA: NEGATIVE
PROTEIN UA: NEGATIVE

## 2016-02-05 LAB — HEMOCCULT GUIAC POC 1CARD (OFFICE): Fecal Occult Blood, POC: NEGATIVE

## 2016-02-05 NOTE — Patient Instructions (Signed)
Physical in 1 year Mammogram yearly Labs with PCP Colonoscopy advised Call Dr Caprice Beaver

## 2016-02-05 NOTE — Progress Notes (Signed)
Patient ID: [name], female   DOB: 1950/11/29, 65 y.o.   MRN: [id number] History of Present Illness: [name] is a 65 year old white female, married in for a well woman gyn exam and pap, her last pap was 01/16/14,negative with +HPV.She is teary today, her mom died in 07-10-2015 and her younger brother was diagnosed with inoperable brain tumor.She used to see Dr Letta Moynahan but insurance has changed, she knows she is depressed.She has chronic back pain but is having low back pain esp in am. PCP is Dr Luan Pulling and sees Dr Dorris Fetch now for diabetes.   Current Medications, Allergies, Past Medical History, Past Surgical History, Family History and Social History were reviewed in [employer] record.     Review of Systems: Patient denies any headaches, hearing loss, fatigue, blurred vision, shortness of breath, chest pain, abdominal pain, problems with bowel movements, urination, or intercourse(not having sex). No joint pain or mood swings.See HPI for positives.    Physical Exam:BP 132/80 mmHg  Pulse 94  Ht 5' 1.5" (1.562 m)  Wt 139 lb (63.05 kg)  BMI 25.84 kg/m2 urine dipstick trace blood and 2+ leuks General:  Well developed, well nourished, no acute distress Skin:  Warm and dry, no rashes Neck:  Midline trachea, normal thyroid, good ROM, no lymphadenopathy, no carotid bruits heard Lungs; Clear to auscultation bilaterally Breast:  No dominant palpable mass, retraction, or nipple discharge, has pace maker above left breast Cardiovascular: Regular rate and rhythm Abdomen:  Soft, non tender, no hepatosplenomegaly Pelvic:  External genitalia is normal in appearance, no lesions.  The vagina is normal in appearance for age. Urethra has no lesions or masses. The cervix is smooth, pap with HPV performed.  Uterus is felt to be normal size, shape, and contour.  No adnexal masses or tenderness noted.Bladder is non tender, no masses felt. Rectal: Good sphincter tone, no polyps,  or hemorrhoids felt.  Hemoccult negative. Extremities/musculoskeletal:  No swelling or varicosities noted, no clubbing or cyanosis Psych:  No mood changes, alert and cooperative,seems upset and is teary Discussed that she may have arthritis in back, but will send urine to R/O UTI, and she needs to call Dr Caprice Beaver for appt.Try to spend time with family but take time for self.  Impression: Well woman gyn exam and pap High risk HPV on pap Hematuria Low back pain Anxiety and depression     Plan: Call Dr Caprice Beaver for cash appt UA C&S sent Physical in 1 year, pap in 3 if normal Mammogram yearly Colonoscopy advised, but she declines for now Labs with PCP and Dr Dorris Fetch

## 2016-02-06 LAB — URINALYSIS, ROUTINE W REFLEX MICROSCOPIC
BILIRUBIN UA: NEGATIVE
Glucose, UA: NEGATIVE
Ketones, UA: NEGATIVE
Nitrite, UA: NEGATIVE
PH UA: 6.5 (ref 5.0–7.5)
PROTEIN UA: NEGATIVE
RBC, UA: NEGATIVE
Specific Gravity, UA: 1.012 (ref 1.005–1.030)
Urobilinogen, Ur: 0.2 mg/dL (ref 0.2–1.0)

## 2016-02-06 LAB — CYTOLOGY - PAP

## 2016-02-06 LAB — MICROSCOPIC EXAMINATION
CASTS: NONE SEEN /LPF
EPITHELIAL CELLS (NON RENAL): NONE SEEN /HPF (ref 0–10)

## 2016-02-07 LAB — URINE CULTURE: ORGANISM ID, BACTERIA: NO GROWTH

## 2016-02-18 DIAGNOSIS — D519 Vitamin B12 deficiency anemia, unspecified: Secondary | ICD-10-CM | POA: Diagnosis not present

## 2016-02-19 DIAGNOSIS — M65331 Trigger finger, right middle finger: Secondary | ICD-10-CM | POA: Diagnosis not present

## 2016-02-19 DIAGNOSIS — M25532 Pain in left wrist: Secondary | ICD-10-CM | POA: Diagnosis not present

## 2016-02-21 ENCOUNTER — Ambulatory Visit (INDEPENDENT_AMBULATORY_CARE_PROVIDER_SITE_OTHER): Payer: Medicare Other | Admitting: Internal Medicine

## 2016-02-21 ENCOUNTER — Encounter: Payer: Self-pay | Admitting: Internal Medicine

## 2016-02-21 VITALS — BP 134/70 | HR 93 | Ht 61.5 in | Wt 140.0 lb

## 2016-02-21 DIAGNOSIS — I5022 Chronic systolic (congestive) heart failure: Secondary | ICD-10-CM | POA: Diagnosis not present

## 2016-02-21 LAB — CUP PACEART INCLINIC DEVICE CHECK
Battery Remaining Longevity: 55 mo
Battery Voltage: 2.97 V
Brady Statistic AS VP Percent: 98.19 %
Date Time Interrogation Session: 20170609100233
HighPow Impedance: 74 Ohm
Implantable Lead Implant Date: 20060214
Implantable Lead Implant Date: 20150406
Implantable Lead Location: 753858
Implantable Lead Location: 753859
Implantable Lead Model: 5076
Lead Channel Impedance Value: 513 Ohm
Lead Channel Impedance Value: 646 Ohm
Lead Channel Impedance Value: 760 Ohm
Lead Channel Pacing Threshold Amplitude: 0.875 V
Lead Channel Pacing Threshold Amplitude: 1.25 V
Lead Channel Pacing Threshold Pulse Width: 0.4 ms
Lead Channel Pacing Threshold Pulse Width: 0.8 ms
Lead Channel Sensing Intrinsic Amplitude: 2.625 mV
Lead Channel Sensing Intrinsic Amplitude: 20 mV
Lead Channel Sensing Intrinsic Amplitude: 4.25 mV
Lead Channel Setting Pacing Amplitude: 2.5 V
MDC IDC LEAD IMPLANT DT: 20150406
MDC IDC LEAD LOCATION: 753860
MDC IDC LEAD MODEL: 4396
MDC IDC MSMT LEADCHNL LV IMPEDANCE VALUE: 361 Ohm
MDC IDC MSMT LEADCHNL LV IMPEDANCE VALUE: 513 Ohm
MDC IDC MSMT LEADCHNL LV PACING THRESHOLD AMPLITUDE: 1 V
MDC IDC MSMT LEADCHNL LV PACING THRESHOLD PULSEWIDTH: 0.6 ms
MDC IDC MSMT LEADCHNL RA PACING THRESHOLD AMPLITUDE: 0.625 V
MDC IDC MSMT LEADCHNL RA PACING THRESHOLD PULSEWIDTH: 0.4 ms
MDC IDC MSMT LEADCHNL RV IMPEDANCE VALUE: 399 Ohm
MDC IDC MSMT LEADCHNL RV SENSING INTR AMPL: 18.25 mV
MDC IDC SET LEADCHNL LV PACING PULSEWIDTH: 0.6 ms
MDC IDC SET LEADCHNL RA PACING AMPLITUDE: 2 V
MDC IDC SET LEADCHNL RV PACING AMPLITUDE: 2.5 V
MDC IDC SET LEADCHNL RV PACING PULSEWIDTH: 0.4 ms
MDC IDC SET LEADCHNL RV SENSING SENSITIVITY: 0.3 mV
MDC IDC STAT BRADY AP VP PERCENT: 0.03 %
MDC IDC STAT BRADY AP VS PERCENT: 0.02 %
MDC IDC STAT BRADY AS VS PERCENT: 1.77 %
MDC IDC STAT BRADY RA PERCENT PACED: 0.05 %
MDC IDC STAT BRADY RV PERCENT PACED: 27.15 %

## 2016-02-21 NOTE — Patient Instructions (Signed)
Your physician wants you to follow-up in: 1 Year with Dr. Lovena Le. You will receive a reminder letter in the mail two months in advance. If you don't receive a letter, please call our office to schedule the follow-up appointment.  Remote monitoring is used to monitor your Pacemaker of ICD from home. This monitoring reduces the number of office visits required to check your device to one time per year. It allows [name] to keep an eye on the functioning of your device to ensure it is working properly. You are scheduled for a device check from home on 05/25/16. You may send your transmission at any time that day. If you have a wireless device, the transmission will be sent automatically. After your physician reviews your transmission, you will receive a postcard with your next transmission date.  Your physician recommends that you continue on your current medications as directed. Please refer to the Current Medication list given to you today.  If you need a refill on your cardiac medications before your next appointment, please call your pharmacy.  Thank you for choosing [facility]!
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
## 2016-02-26 DIAGNOSIS — R339 Retention of urine, unspecified: Secondary | ICD-10-CM | POA: Diagnosis not present

## 2016-03-02 DIAGNOSIS — M5410 Radiculopathy, site unspecified: Secondary | ICD-10-CM | POA: Diagnosis not present

## 2016-03-02 DIAGNOSIS — I10 Essential (primary) hypertension: Secondary | ICD-10-CM | POA: Diagnosis not present

## 2016-03-02 DIAGNOSIS — E1165 Type 2 diabetes mellitus with hyperglycemia: Secondary | ICD-10-CM | POA: Diagnosis not present

## 2016-03-02 DIAGNOSIS — I420 Dilated cardiomyopathy: Secondary | ICD-10-CM | POA: Diagnosis not present

## 2016-03-13 ENCOUNTER — Other Ambulatory Visit: Payer: Self-pay | Admitting: "Endocrinology

## 2016-03-13 DIAGNOSIS — E118 Type 2 diabetes mellitus with unspecified complications: Secondary | ICD-10-CM | POA: Diagnosis not present

## 2016-03-13 DIAGNOSIS — E1165 Type 2 diabetes mellitus with hyperglycemia: Secondary | ICD-10-CM | POA: Diagnosis not present

## 2016-03-13 DIAGNOSIS — Z794 Long term (current) use of insulin: Secondary | ICD-10-CM | POA: Diagnosis not present

## 2016-03-13 DIAGNOSIS — E785 Hyperlipidemia, unspecified: Secondary | ICD-10-CM | POA: Diagnosis not present

## 2016-03-13 LAB — COMPLETE METABOLIC PANEL WITH GFR
ALT: 22 U/L (ref 6–29)
AST: 17 U/L (ref 10–35)
Albumin: 4.2 g/dL (ref 3.6–5.1)
Alkaline Phosphatase: 74 U/L (ref 33–130)
BILIRUBIN TOTAL: 0.3 mg/dL (ref 0.2–1.2)
BUN: 21 mg/dL (ref 7–25)
CALCIUM: 9.2 mg/dL (ref 8.6–10.4)
CHLORIDE: 102 mmol/L (ref 98–110)
CO2: 27 mmol/L (ref 20–31)
Creat: 1.02 mg/dL — ABNORMAL HIGH (ref 0.50–0.99)
GFR, Est African American: 68 mL/min (ref >=60)
GFR, Est Non African American: 59 mL/min — ABNORMAL LOW (ref >=60)
Glucose, Bld: 121 mg/dL — ABNORMAL HIGH (ref 65–99)
Potassium: 4.2 mmol/L (ref 3.5–5.3)
Sodium: 141 mmol/L (ref 135–146)
TOTAL PROTEIN: 6.9 g/dL (ref 6.1–8.1)

## 2016-03-13 LAB — LIPID PANEL
CHOL/HDL RATIO: 4.1 ratio (ref <=5.0)
CHOLESTEROL: 186 mg/dL (ref 125–200)
HDL: 45 mg/dL — AB (ref >=46)
LDL Cholesterol: 87 mg/dL (ref <130)
TRIGLYCERIDES: 268 mg/dL — AB (ref <150)
VLDL: 54 mg/dL — AB (ref <30)

## 2016-03-13 LAB — HEMOGLOBIN A1C
Hgb A1c MFr Bld: 8 % — ABNORMAL HIGH (ref <5.7)
MEAN PLASMA GLUCOSE: 183 mg/dL

## 2016-03-14 LAB — MICROALBUMIN / CREATININE URINE RATIO
Creatinine, Urine: 115 mg/dL (ref 20–320)
Microalb Creat Ratio: 11 mcg/mg creat (ref <30)
Microalb, Ur: 1.3 mg/dL

## 2016-03-20 ENCOUNTER — Encounter: Payer: Self-pay | Admitting: "Endocrinology

## 2016-03-20 ENCOUNTER — Telehealth: Payer: Self-pay | Admitting: "Endocrinology

## 2016-03-20 ENCOUNTER — Ambulatory Visit (INDEPENDENT_AMBULATORY_CARE_PROVIDER_SITE_OTHER): Payer: Medicare Other | Admitting: "Endocrinology

## 2016-03-20 VITALS — BP 129/78 | HR 90 | Ht 61.5 in | Wt 136.0 lb

## 2016-03-20 DIAGNOSIS — E118 Type 2 diabetes mellitus with unspecified complications: Secondary | ICD-10-CM

## 2016-03-20 DIAGNOSIS — IMO0002 Reserved for concepts with insufficient information to code with codable children: Secondary | ICD-10-CM

## 2016-03-20 DIAGNOSIS — I10 Essential (primary) hypertension: Secondary | ICD-10-CM

## 2016-03-20 DIAGNOSIS — E785 Hyperlipidemia, unspecified: Secondary | ICD-10-CM

## 2016-03-20 DIAGNOSIS — Z794 Long term (current) use of insulin: Secondary | ICD-10-CM

## 2016-03-20 DIAGNOSIS — E1165 Type 2 diabetes mellitus with hyperglycemia: Secondary | ICD-10-CM

## 2016-03-20 MED ORDER — METFORMIN HCL 500 MG PO TABS: 500.0000 mg | ORAL_TABLET | Freq: Two times a day (BID) | ORAL | Status: DC

## 2016-03-20 NOTE — Telephone Encounter (Signed)
500 mg twice a day, I believe that's how I sent it to the pharmacy.

## 2016-03-20 NOTE — Telephone Encounter (Signed)
Should metformin be 1000mg  bid or metformin 500mg  bid.

## 2016-03-20 NOTE — Telephone Encounter (Signed)
pharmacy called and said med was changed to one times a day instead of 2x a day - wants a call back to confirm

## 2016-03-20 NOTE — Telephone Encounter (Signed)
Pharmacy notified.
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
## 2016-03-23 DIAGNOSIS — E538 Deficiency of other specified B group vitamins: Secondary | ICD-10-CM | POA: Diagnosis not present

## 2016-03-31 DIAGNOSIS — E119 Type 2 diabetes mellitus without complications: Secondary | ICD-10-CM | POA: Diagnosis not present

## 2016-04-02 ENCOUNTER — Ambulatory Visit (INDEPENDENT_AMBULATORY_CARE_PROVIDER_SITE_OTHER): Payer: Medicare Other | Admitting: Otolaryngology

## 2016-04-02 DIAGNOSIS — H903 Sensorineural hearing loss, bilateral: Secondary | ICD-10-CM

## 2016-04-13 ENCOUNTER — Telehealth: Payer: Self-pay | Admitting: "Endocrinology

## 2016-04-13 NOTE — Telephone Encounter (Signed)
Left message to return call 

## 2016-04-13 NOTE — Telephone Encounter (Signed)
Pt said Dr. Dorris Fetch changed her from 4 a day metformin to 2 a day - she can't get her readings stable and wants to know what to do

## 2016-04-13 NOTE — Telephone Encounter (Signed)
Patient returned my call and stated that she only test her blood sugar once daily, and her readings have been as follows: 04/10/16 245 04/11/16 166 / 354 (later that afternoon) 04/12/16 215 04/13/16 196 / 209 (later that afternoon) Please advise

## 2016-04-14 DIAGNOSIS — E538 Deficiency of other specified B group vitamins: Secondary | ICD-10-CM | POA: Diagnosis not present

## 2016-04-14 NOTE — Telephone Encounter (Signed)
I will need  All readings for 2 days

## 2016-04-22 NOTE — Telephone Encounter (Signed)
Routing to [employer]

## 2016-05-04 ENCOUNTER — Other Ambulatory Visit: Payer: Self-pay | Admitting: "Endocrinology

## 2016-05-04 ENCOUNTER — Other Ambulatory Visit: Payer: Self-pay | Admitting: Internal Medicine

## 2016-05-22 DIAGNOSIS — E119 Type 2 diabetes mellitus without complications: Secondary | ICD-10-CM | POA: Diagnosis not present

## 2016-05-25 ENCOUNTER — Ambulatory Visit (INDEPENDENT_AMBULATORY_CARE_PROVIDER_SITE_OTHER): Payer: Medicare Other | Admitting: *Deleted

## 2016-05-25 DIAGNOSIS — I5022 Chronic systolic (congestive) heart failure: Secondary | ICD-10-CM | POA: Diagnosis not present

## 2016-05-25 DIAGNOSIS — I429 Cardiomyopathy, unspecified: Secondary | ICD-10-CM

## 2016-05-25 DIAGNOSIS — Z95810 Presence of automatic (implantable) cardiac defibrillator: Secondary | ICD-10-CM

## 2016-05-25 NOTE — Progress Notes (Signed)
Remote ICD transmission.   

## 2016-05-27 DIAGNOSIS — E538 Deficiency of other specified B group vitamins: Secondary | ICD-10-CM | POA: Diagnosis not present

## 2016-05-27 LAB — CUP PACEART REMOTE DEVICE CHECK
Brady Statistic AP VP Percent: 0.03 %
Brady Statistic AP VS Percent: 0.02 %
Brady Statistic AS VP Percent: 98.34 %
Brady Statistic RA Percent Paced: 0.05 %
Brady Statistic RV Percent Paced: 27.02 %
Date Time Interrogation Session: 20170911120807
HighPow Impedance: 72 Ohm
Implantable Lead Implant Date: 20060214
Implantable Lead Implant Date: 20150406
Implantable Lead Location: 753858
Implantable Lead Location: 753859
Implantable Lead Model: 4396
Implantable Lead Model: 5076
Lead Channel Impedance Value: 361 Ohm
Lead Channel Impedance Value: 513 Ohm
Lead Channel Impedance Value: 589 Ohm
Lead Channel Impedance Value: 722 Ohm
Lead Channel Pacing Threshold Amplitude: 0.5 V
Lead Channel Pacing Threshold Pulse Width: 0.4 ms
Lead Channel Pacing Threshold Pulse Width: 0.4 ms
Lead Channel Sensing Intrinsic Amplitude: 16.375 mV
Lead Channel Sensing Intrinsic Amplitude: 3.125 mV
Lead Channel Sensing Intrinsic Amplitude: 3.125 mV
Lead Channel Setting Pacing Pulse Width: 0.6 ms
MDC IDC LEAD IMPLANT DT: 20150406
MDC IDC LEAD LOCATION: 753860
MDC IDC MSMT BATTERY REMAINING LONGEVITY: 42 mo
MDC IDC MSMT BATTERY VOLTAGE: 2.97 V
MDC IDC MSMT LEADCHNL LV PACING THRESHOLD AMPLITUDE: 1.875 V
MDC IDC MSMT LEADCHNL LV PACING THRESHOLD PULSEWIDTH: 0.6 ms
MDC IDC MSMT LEADCHNL RV IMPEDANCE VALUE: 342 Ohm
MDC IDC MSMT LEADCHNL RV IMPEDANCE VALUE: 418 Ohm
MDC IDC MSMT LEADCHNL RV PACING THRESHOLD AMPLITUDE: 0.75 V
MDC IDC MSMT LEADCHNL RV SENSING INTR AMPL: 16.375 mV
MDC IDC SET LEADCHNL LV PACING AMPLITUDE: 3 V
MDC IDC SET LEADCHNL RA PACING AMPLITUDE: 2 V
MDC IDC SET LEADCHNL RV SENSING SENSITIVITY: 0.3 mV
MDC IDC STAT BRADY AS VS PERCENT: 1.61 %

## 2016-05-28 ENCOUNTER — Encounter: Payer: Self-pay | Admitting: Cardiology

## 2016-06-01 DIAGNOSIS — M545 Low back pain: Secondary | ICD-10-CM | POA: Diagnosis not present

## 2016-06-01 DIAGNOSIS — I420 Dilated cardiomyopathy: Secondary | ICD-10-CM | POA: Diagnosis not present

## 2016-06-01 DIAGNOSIS — G44329 Chronic post-traumatic headache, not intractable: Secondary | ICD-10-CM | POA: Diagnosis not present

## 2016-06-01 DIAGNOSIS — I10 Essential (primary) hypertension: Secondary | ICD-10-CM | POA: Diagnosis not present

## 2016-06-01 DIAGNOSIS — Z23 Encounter for immunization: Secondary | ICD-10-CM | POA: Diagnosis not present

## 2016-06-02 ENCOUNTER — Other Ambulatory Visit: Payer: Self-pay

## 2016-06-02 ENCOUNTER — Telehealth: Payer: Self-pay | Admitting: Cardiology

## 2016-06-02 MED ORDER — GLUCOSE BLOOD VI STRP: ORAL_STRIP | 5 refills | Status: DC

## 2016-06-02 NOTE — Telephone Encounter (Signed)
Transmission received- no alerts, normal device function. Spoke with [name]- she describes the sound as a [name] steady tone. She reports that she was cleaning her fireplace glass when she heard the tone. I informed her that this is a "magnet response" from the device and that her device returned to normal function after she stepped away from the magnet at the fireplace. She verbalizes understanding and is appreciative.

## 2016-06-02 NOTE — Telephone Encounter (Signed)
Pt heard a noise from her implanted device. Pt stated that she feels fine. Pt is going to send a remote transmission. Once transmission is received please call and discuss results.

## 2016-06-04 ENCOUNTER — Other Ambulatory Visit: Payer: Self-pay

## 2016-06-04 MED ORDER — BLOOD GLUCOSE MONITOR KIT
PACK | 0 refills | Status: DC
Start: 2016-06-04 — End: 2016-12-03

## 2016-06-16 ENCOUNTER — Ambulatory Visit (INDEPENDENT_AMBULATORY_CARE_PROVIDER_SITE_OTHER): Payer: Medicare Other | Admitting: Urology

## 2016-06-16 DIAGNOSIS — N312 Flaccid neuropathic bladder, not elsewhere classified: Secondary | ICD-10-CM | POA: Diagnosis not present

## 2016-06-18 ENCOUNTER — Other Ambulatory Visit: Payer: Self-pay | Admitting: "Endocrinology

## 2016-06-18 LAB — COMPLETE METABOLIC PANEL WITHOUT GFR
ALT: 19 U/L (ref 6–29)
AST: 14 U/L (ref 10–35)
Albumin: 4.3 g/dL (ref 3.6–5.1)
Alkaline Phosphatase: 80 U/L (ref 33–130)
BUN: 19 mg/dL (ref 7–25)
CO2: 27 mmol/L (ref 20–31)
Calcium: 9.8 mg/dL (ref 8.6–10.4)
Chloride: 98 mmol/L (ref 98–110)
Creat: 1.07 mg/dL — ABNORMAL HIGH (ref 0.50–0.99)
GFR, Est African American: 64 mL/min
GFR, Est Non African American: 55 mL/min — ABNORMAL LOW
Glucose, Bld: 192 mg/dL — ABNORMAL HIGH (ref 65–99)
Potassium: 4.5 mmol/L (ref 3.5–5.3)
Sodium: 139 mmol/L (ref 135–146)
Total Bilirubin: 0.4 mg/dL (ref 0.2–1.2)
Total Protein: 7.1 g/dL (ref 6.1–8.1)

## 2016-06-19 LAB — HEMOGLOBIN A1C
Hgb A1c MFr Bld: 8.3 % — ABNORMAL HIGH (ref <5.7)
MEAN PLASMA GLUCOSE: 192 mg/dL

## 2016-06-25 ENCOUNTER — Ambulatory Visit (INDEPENDENT_AMBULATORY_CARE_PROVIDER_SITE_OTHER): Payer: Medicare Other | Admitting: "Endocrinology

## 2016-06-25 ENCOUNTER — Encounter: Payer: Self-pay | Admitting: "Endocrinology

## 2016-06-25 VITALS — BP 119/72 | HR 98 | Ht 61.5 in | Wt 140.0 lb

## 2016-06-25 DIAGNOSIS — E1165 Type 2 diabetes mellitus with hyperglycemia: Secondary | ICD-10-CM | POA: Diagnosis not present

## 2016-06-25 DIAGNOSIS — E782 Mixed hyperlipidemia: Secondary | ICD-10-CM

## 2016-06-25 DIAGNOSIS — Z794 Long term (current) use of insulin: Secondary | ICD-10-CM | POA: Diagnosis not present

## 2016-06-25 DIAGNOSIS — E118 Type 2 diabetes mellitus with unspecified complications: Secondary | ICD-10-CM

## 2016-06-25 DIAGNOSIS — I10 Essential (primary) hypertension: Secondary | ICD-10-CM | POA: Diagnosis not present

## 2016-06-25 DIAGNOSIS — IMO0002 Reserved for concepts with insufficient information to code with codable children: Secondary | ICD-10-CM

## 2016-06-25 NOTE — Patient Instructions (Signed)
Advice for weight management -For most of us the best way to lose weight is by diet management. Generally speaking, diet management means restricting carbohydrate consumption to minimum possible (and to unprocessed or minimally processed complex starch) and increasing protein intake (animal or plant source), fruits, and vegetables.  -Sticking to a routine mealtime to eat 3 meals a day and avoiding unnecessary snacks is shown to have a big role in weight control.  -It is better to avoid simple carbohydrates including: Cakes, Desserts, Ice Cream, Soda (diet and regular), Sweet Tea, Candies, Chips, Cookies, Artificial Sweeteners, and "Sugar-free" Products.   -Exercise: 30 minutes a day 3-4 days a week, or 150 minutes a week. Combine stretch, strength, and aerobic activities. You may seek evaluation by your heart doctor prior to initiating exercise if you have high risk for heart disease.  -If you are interested, we can schedule a visit with [name], RDN, CDE for individualized nutrition education.  
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
## 2016-07-28 ENCOUNTER — Telehealth: Payer: Self-pay | Admitting: "Endocrinology

## 2016-07-28 NOTE — Telephone Encounter (Signed)
Patient states she has had some high readings at night.  Saturday AM 96 Saturday PM 142  Sunday  AM 88 Sunday PM 215  Monday AM 127 Monday PM 215  Tuesday AM 147  Patient says she had readings the week before that were over 200.  Please advise.

## 2016-07-28 NOTE — Telephone Encounter (Signed)
I spoke with patient, nor change necessary. she will call if her readings are above 300 mg/dL.

## 2016-08-04 ENCOUNTER — Telehealth: Payer: Self-pay | Admitting: Internal Medicine

## 2016-08-04 NOTE — Telephone Encounter (Signed)
[name] is calling because she is feeling pain where the defibrillator is . States is kind of hard to describe it . Please call

## 2016-08-04 NOTE — Telephone Encounter (Signed)
Spoke with patient who had difficulty describing the pain she felt at her site. She additionally described ShOB and fatigue developing and worsening over the last 3 weeks. Reviewed with Dr. Lovena Le, will schedule her for 11/28 at 8:30am.

## 2016-08-11 ENCOUNTER — Encounter: Payer: Self-pay | Admitting: Internal Medicine

## 2016-08-11 ENCOUNTER — Ambulatory Visit (INDEPENDENT_AMBULATORY_CARE_PROVIDER_SITE_OTHER): Payer: Medicare Other | Admitting: Internal Medicine

## 2016-08-11 ENCOUNTER — Other Ambulatory Visit: Payer: Self-pay

## 2016-08-11 VITALS — BP 114/68 | HR 88 | Ht 61.0 in | Wt 137.8 lb

## 2016-08-11 DIAGNOSIS — R0602 Shortness of breath: Secondary | ICD-10-CM | POA: Diagnosis not present

## 2016-08-11 DIAGNOSIS — Z95810 Presence of automatic (implantable) cardiac defibrillator: Secondary | ICD-10-CM | POA: Diagnosis not present

## 2016-08-11 LAB — CUP PACEART INCLINIC DEVICE CHECK
Brady Statistic AP VS Percent: 0.02 %
Brady Statistic AS VS Percent: 1.67 %
Date Time Interrogation Session: 20171128141716
HIGH POWER IMPEDANCE MEASURED VALUE: 70 Ohm
Implantable Lead Implant Date: 20150406
Implantable Lead Location: 753860
Implantable Pulse Generator Implant Date: 20150406
Lead Channel Impedance Value: 361 Ohm
Lead Channel Impedance Value: 456 Ohm
Lead Channel Impedance Value: 608 Ohm
Lead Channel Pacing Threshold Amplitude: 1.75 V
Lead Channel Pacing Threshold Pulse Width: 0.4 ms
Lead Channel Sensing Intrinsic Amplitude: 18.75 mV
Lead Channel Setting Pacing Amplitude: 2 V
Lead Channel Setting Pacing Pulse Width: 0.4 ms
Lead Channel Setting Pacing Pulse Width: 0.6 ms
MDC IDC LEAD IMPLANT DT: 20060214
MDC IDC LEAD IMPLANT DT: 20150406
MDC IDC LEAD LOCATION: 753858
MDC IDC LEAD LOCATION: 753859
MDC IDC LEAD MODEL: 4396
MDC IDC MSMT BATTERY REMAINING LONGEVITY: 40 mo
MDC IDC MSMT BATTERY VOLTAGE: 2.91 V
MDC IDC MSMT LEADCHNL LV IMPEDANCE VALUE: 418 Ohm
MDC IDC MSMT LEADCHNL LV IMPEDANCE VALUE: 646 Ohm
MDC IDC MSMT LEADCHNL LV PACING THRESHOLD PULSEWIDTH: 0.6 ms
MDC IDC MSMT LEADCHNL RA PACING THRESHOLD AMPLITUDE: 0.75 V
MDC IDC MSMT LEADCHNL RA SENSING INTR AMPL: 3 mV
MDC IDC MSMT LEADCHNL RA SENSING INTR AMPL: 3.625 mV
MDC IDC MSMT LEADCHNL RV IMPEDANCE VALUE: 361 Ohm
MDC IDC MSMT LEADCHNL RV PACING THRESHOLD AMPLITUDE: 0.75 V
MDC IDC MSMT LEADCHNL RV PACING THRESHOLD PULSEWIDTH: 0.4 ms
MDC IDC MSMT LEADCHNL RV SENSING INTR AMPL: 16.875 mV
MDC IDC SET LEADCHNL LV PACING AMPLITUDE: 2.75 V
MDC IDC SET LEADCHNL RV PACING AMPLITUDE: 2.5 V
MDC IDC SET LEADCHNL RV SENSING SENSITIVITY: 0.3 mV
MDC IDC STAT BRADY AP VP PERCENT: 0.03 %
MDC IDC STAT BRADY AS VP PERCENT: 98.29 %
MDC IDC STAT BRADY RA PERCENT PACED: 0.05 %
MDC IDC STAT BRADY RV PERCENT PACED: 26.21 %
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
## 2016-08-11 NOTE — Patient Instructions (Addendum)
Medication Instructions:  Your physician recommends that you continue on your current medications as directed. Please refer to the Current Medication list given to you today.   Labwork: None Ordered   Testing/Procedures: None Ordered   Follow-Up: Your physician wants you to follow-up in: 1 year with Dr. Lovena Le. You will receive a reminder letter in the mail two months in advance. If you don't receive a letter, please call our office to schedule the follow-up appointment.  Remote monitoring is used to monitor your ICD from home. This monitoring reduces the number of office visits required to check your device to one time per year. It allows [name] to keep an eye on the functioning of your device to ensure it is working properly. You are scheduled for a device check from home on 11/10/16. You may send your transmission at any time that day. If you have a wireless device, the transmission will be sent automatically. After your physician reviews your transmission, you will receive a postcard with your next transmission date.     Any Other Special Instructions Will Be Listed Below (If Applicable).     If you need a refill on your cardiac medications before your next appointment, please call your pharmacy.

## 2016-08-24 ENCOUNTER — Ambulatory Visit (INDEPENDENT_AMBULATORY_CARE_PROVIDER_SITE_OTHER): Payer: Medicare Other | Admitting: *Deleted

## 2016-08-24 ENCOUNTER — Telehealth: Payer: Self-pay | Admitting: Cardiology

## 2016-08-24 DIAGNOSIS — I255 Ischemic cardiomyopathy: Secondary | ICD-10-CM

## 2016-08-24 DIAGNOSIS — I5042 Chronic combined systolic (congestive) and diastolic (congestive) heart failure: Secondary | ICD-10-CM | POA: Diagnosis not present

## 2016-08-24 NOTE — Telephone Encounter (Signed)
LMOVM reminding pt to send remote transmission.   

## 2016-08-25 NOTE — Progress Notes (Signed)
Remote ICD transmission.   

## 2016-08-28 ENCOUNTER — Encounter: Payer: Self-pay | Admitting: Cardiology

## 2016-09-01 LAB — CUP PACEART REMOTE DEVICE CHECK
Brady Statistic AP VS Percent: 0.01 %
Brady Statistic AS VP Percent: 98.72 %
Brady Statistic AS VS Percent: 1.24 %
Brady Statistic RA Percent Paced: 0.05 %
Brady Statistic RV Percent Paced: 20.68 %
Date Time Interrogation Session: 20171211194412
HIGH POWER IMPEDANCE MEASURED VALUE: 65 Ohm
Implantable Lead Implant Date: 20150406
Implantable Lead Location: 753860
Lead Channel Impedance Value: 342 Ohm
Lead Channel Impedance Value: 418 Ohm
Lead Channel Impedance Value: 513 Ohm
Lead Channel Impedance Value: 589 Ohm
Lead Channel Pacing Threshold Amplitude: 0.625 V
Lead Channel Pacing Threshold Amplitude: 1.625 V
Lead Channel Pacing Threshold Pulse Width: 0.4 ms
Lead Channel Pacing Threshold Pulse Width: 0.4 ms
Lead Channel Sensing Intrinsic Amplitude: 14.25 mV
Lead Channel Setting Pacing Amplitude: 2 V
Lead Channel Setting Pacing Amplitude: 2.5 V
Lead Channel Setting Pacing Pulse Width: 0.4 ms
Lead Channel Setting Pacing Pulse Width: 0.6 ms
Lead Channel Setting Sensing Sensitivity: 0.3 mV
MDC IDC LEAD IMPLANT DT: 20060214
MDC IDC LEAD IMPLANT DT: 20150406
MDC IDC LEAD LOCATION: 753858
MDC IDC LEAD LOCATION: 753859
MDC IDC LEAD MODEL: 4396
MDC IDC MSMT BATTERY REMAINING LONGEVITY: 41 mo
MDC IDC MSMT BATTERY VOLTAGE: 2.96 V
MDC IDC MSMT LEADCHNL LV IMPEDANCE VALUE: 760 Ohm
MDC IDC MSMT LEADCHNL LV PACING THRESHOLD PULSEWIDTH: 0.6 ms
MDC IDC MSMT LEADCHNL RA SENSING INTR AMPL: 3.125 mV
MDC IDC MSMT LEADCHNL RA SENSING INTR AMPL: 3.125 mV
MDC IDC MSMT LEADCHNL RV IMPEDANCE VALUE: 342 Ohm
MDC IDC MSMT LEADCHNL RV PACING THRESHOLD AMPLITUDE: 0.875 V
MDC IDC MSMT LEADCHNL RV SENSING INTR AMPL: 14.25 mV
MDC IDC PG IMPLANT DT: 20150406
MDC IDC SET LEADCHNL LV PACING AMPLITUDE: 2.75 V
MDC IDC STAT BRADY AP VP PERCENT: 0.03 %

## 2016-09-09 ENCOUNTER — Other Ambulatory Visit: Payer: Self-pay | Admitting: "Endocrinology

## 2016-09-10 ENCOUNTER — Other Ambulatory Visit: Payer: Self-pay

## 2016-09-10 MED ORDER — INSULIN DETEMIR 100 UNIT/ML FLEXPEN: 46.0000 [IU] | PEN_INJECTOR | Freq: Every day | SUBCUTANEOUS | 2 refills | Status: DC

## 2016-09-18 ENCOUNTER — Encounter (HOSPITAL_COMMUNITY): Payer: Self-pay | Admitting: Emergency Medicine

## 2016-09-18 ENCOUNTER — Emergency Department (HOSPITAL_COMMUNITY): Payer: Medicare Other

## 2016-09-18 ENCOUNTER — Emergency Department (HOSPITAL_COMMUNITY)
Admission: EM | Admit: 2016-09-18 | Discharge: 2016-09-18 | Disposition: A | Discharge status: Home / Self Care | Payer: Medicare Other | Attending: Emergency Medicine | Admitting: Emergency Medicine

## 2016-09-18 DIAGNOSIS — E860 Dehydration: Secondary | ICD-10-CM | POA: Insufficient documentation

## 2016-09-18 DIAGNOSIS — I110 Hypertensive heart disease with heart failure: Secondary | ICD-10-CM | POA: Insufficient documentation

## 2016-09-18 DIAGNOSIS — R112 Nausea with vomiting, unspecified: Secondary | ICD-10-CM

## 2016-09-18 DIAGNOSIS — E119 Type 2 diabetes mellitus without complications: Secondary | ICD-10-CM | POA: Insufficient documentation

## 2016-09-18 DIAGNOSIS — R197 Diarrhea, unspecified: Secondary | ICD-10-CM | POA: Diagnosis not present

## 2016-09-18 DIAGNOSIS — R1110 Vomiting, unspecified: Secondary | ICD-10-CM | POA: Diagnosis not present

## 2016-09-18 DIAGNOSIS — Z794 Long term (current) use of insulin: Secondary | ICD-10-CM | POA: Insufficient documentation

## 2016-09-18 DIAGNOSIS — I5042 Chronic combined systolic (congestive) and diastolic (congestive) heart failure: Secondary | ICD-10-CM | POA: Diagnosis not present

## 2016-09-18 DIAGNOSIS — Z79899 Other long term (current) drug therapy: Secondary | ICD-10-CM | POA: Insufficient documentation

## 2016-09-18 LAB — CBC
HEMATOCRIT: 39.1 % (ref 36.0–46.0)
HEMOGLOBIN: 12.8 g/dL (ref 12.0–15.0)
MCH: 29.4 pg (ref 26.0–34.0)
MCHC: 32.7 g/dL (ref 30.0–36.0)
MCV: 89.9 fL (ref 78.0–100.0)
Platelets: 290 10*3/uL (ref 150–400)
RBC: 4.35 MIL/uL (ref 3.87–5.11)
RDW: 13.8 % (ref 11.5–15.5)
WBC: 7.4 10*3/uL (ref 4.0–10.5)

## 2016-09-18 LAB — COMPREHENSIVE METABOLIC PANEL
ALBUMIN: 4.1 g/dL (ref 3.5–5.0)
ALT: 27 U/L (ref 14–54)
ANION GAP: 14 (ref 5–15)
AST: 28 U/L (ref 15–41)
Alkaline Phosphatase: 92 U/L (ref 38–126)
BUN: 26 mg/dL — ABNORMAL HIGH (ref 6–20)
CALCIUM: 8.8 mg/dL — AB (ref 8.9–10.3)
CHLORIDE: 102 mmol/L (ref 101–111)
CO2: 20 mmol/L — AB (ref 22–32)
Creatinine, Ser: 1.23 mg/dL — ABNORMAL HIGH (ref 0.44–1.00)
GFR calc Af Amer: 53 mL/min — ABNORMAL LOW (ref >60)
GFR calc non Af Amer: 46 mL/min — ABNORMAL LOW (ref >60)
GLUCOSE: 351 mg/dL — AB (ref 65–99)
POTASSIUM: 4 mmol/L (ref 3.5–5.1)
SODIUM: 136 mmol/L (ref 135–145)
Total Bilirubin: 0.6 mg/dL (ref 0.3–1.2)
Total Protein: 7.7 g/dL (ref 6.5–8.1)

## 2016-09-18 LAB — URINALYSIS, ROUTINE W REFLEX MICROSCOPIC
BACTERIA UA: NONE SEEN
BILIRUBIN URINE: NEGATIVE
Glucose, UA: 50 mg/dL — AB
Hgb urine dipstick: NEGATIVE
KETONES UR: NEGATIVE mg/dL
Nitrite: NEGATIVE
Protein, ur: NEGATIVE mg/dL
SPECIFIC GRAVITY, URINE: 1.014 (ref 1.005–1.030)
pH: 5 (ref 5.0–8.0)

## 2016-09-18 LAB — LIPASE, BLOOD: Lipase: 23 U/L (ref 11–51)

## 2016-09-18 MED ORDER — ACETAMINOPHEN 325 MG PO TABS
650.0000 mg | ORAL_TABLET | Freq: Once | ORAL | Status: DC
  Filled 2016-09-18: qty 2

## 2016-09-18 MED ORDER — KETOROLAC TROMETHAMINE 30 MG/ML IJ SOLN
15.0000 mg | Freq: Once | INTRAMUSCULAR | Status: AC
  Administered 2016-09-18: 15 mg via INTRAVENOUS
  Filled 2016-09-18: qty 1

## 2016-09-18 MED ORDER — SODIUM CHLORIDE 0.9 % IV BOLUS (SEPSIS)
1000.0000 mL | Freq: Once | INTRAVENOUS | Status: AC
  Administered 2016-09-18: 1000 mL via INTRAVENOUS

## 2016-09-18 MED ORDER — METOCLOPRAMIDE HCL 10 MG PO TABS: 10.0000 mg | ORAL_TABLET | Freq: Four times a day (QID) | ORAL | 0 refills | Status: DC | PRN

## 2016-09-18 MED ORDER — ACETAMINOPHEN 325 MG PO TABS
650.0000 mg | ORAL_TABLET | Freq: Once | ORAL | Status: AC
  Administered 2016-09-18: 650 mg via ORAL

## 2016-09-18 MED ORDER — ONDANSETRON HCL 4 MG/2ML IJ SOLN
4.0000 mg | Freq: Once | INTRAMUSCULAR | Status: AC
  Administered 2016-09-18: 4 mg via INTRAVENOUS

## 2016-09-18 MED ORDER — KETOROLAC TROMETHAMINE 30 MG/ML IJ SOLN: 30.0000 mg | Freq: Once | INTRAMUSCULAR | Status: DC

## 2016-09-18 MED ORDER — ONDANSETRON HCL 4 MG/2ML IJ SOLN
INTRAMUSCULAR | Status: AC
  Filled 2016-09-18: qty 2

## 2016-09-18 MED ORDER — METOCLOPRAMIDE HCL 5 MG/ML IJ SOLN
10.0000 mg | Freq: Once | INTRAMUSCULAR | Status: AC
  Administered 2016-09-18: 10 mg via INTRAVENOUS
  Filled 2016-09-18: qty 2

## 2016-09-18 MED ORDER — SODIUM CHLORIDE 0.9 % IV BOLUS (SEPSIS)
500.0000 mL | Freq: Once | INTRAVENOUS | Status: AC
  Administered 2016-09-18: 500 mL via INTRAVENOUS

## 2016-09-18 NOTE — ED Triage Notes (Signed)
Patient states body aches nausea, vomiting that started today.

## 2016-09-18 NOTE — ED Provider Notes (Signed)
[facility] DEPT Provider Note   CSN: [id number] Arrival date & time: 09/18/16  1032   By signing my name below, I, Hilbert Odor, attest that this documentation has been prepared under the direction and in the presence of Merrily Pew, MD. Electronically Signed: Hilbert Odor, Scribe. 09/18/16. 11:18 AM. History   Chief Complaint Chief Complaint  Patient presents with  . Emesis    The history is provided by the patient and the spouse. No language interpreter was used.   HPI Comments: [name] is a 66 y.o. female who presents to the Emergency Department complaining of vomiting since 4 am this morning. She denies taking any medications to alleviate her vomiting. Patient also reports middle back pain. She denies fever. She reports having a flu shot this year. She reports no sick contact. She has a hx of type 2 diabetes which she takes [place]. She denies hx of A-fib. She is currently on 120 mg of lasix a day. Patient was last seen by her cardiologist last year but no recent ECHO was performed.  Past Medical History:  Diagnosis Date  . Anxiety   . Cardiomyopathy   . Cervical high risk HPV (human papillomavirus) test positive 01/30/2015  . Chronic constipation   . Chronic lower back pain   . Constipation 01/30/2015  . Depression   . GERD (gastroesophageal reflux disease)   . Heart failure    Chronic combined  . HTN (hypertension)   . Hypercholesteremia   . Neurogenic bladder    2008 nerve damage s/p back ssurgery  . Type II diabetes mellitus [facility])     Patient Active Problem List   Diagnosis Date Noted  . Essential hypertension, benign 09/12/2015  . Constipation 01/30/2015  . Cervical high risk HPV (human papillomavirus) test positive 01/30/2015  . Biventricular ICD (implantable cardioverter-defibrillator) in place 04/19/2014  . Chronic systolic heart failure ([place]) 12/18/2013  . Preop cardiovascular exam 11/17/2011  . Colon cancer screening 02/04/2011  . Type  2 diabetes mellitus, uncontrolled ([place]) 12/31/2009  . Hyperlipidemia 12/31/2009  . ANXIETY DEPRESSION 12/31/2009  . GASTROESOPHAGEAL REFLUX DISEASE, CHRONIC 12/31/2009  . CONSTIPATION, CHRONIC 12/31/2009  . BACK PAIN, CHRONIC 12/31/2009  . CARDIOMYOPATHY 05/22/2009  . HYPERTENSION, HEART CONTROLLED W/O ASSOC CHF 11/27/2008  . COMBINED HEART FAILURE, CHRONIC 11/27/2008  . PACEMAKER, PERMANENT 11/27/2008    Past Surgical History:  Procedure Laterality Date  . ACHILLES TENDON SURGERY Right 06/2012  . BACK SURGERY  2005  . BI-VENTRICULAR IMPLANTABLE CARDIOVERTER DEFIBRILLATOR  (CRT-D)  12-18-2013   upgrade of previously implanted dual chamber pacemaker to MDT CRTD with RV lead revision by Dr Lovena Le  . BI-VENTRICULAR PACEMAKER UPGRADE  12/18/2013   w/defibrillator  . BIV PACEMAKER GENERATOR CHANGE OUT N/A 12/18/2013   Procedure: BIV PACEMAKER GENERATOR CHANGE OUT;  Surgeon: Evans Lance, MD;  Location: [facility] CATH LAB;  Service: Cardiovascular;  Laterality: N/A;  . BREAST BIOPSY Left 1973   benign  . BREAST LUMPECTOMY Left 1973  . CESAREAN SECTION  1981  . CHOLECYSTECTOMY    . GASTROCNEMIUS RECESSION  06/24/2012   Procedure: GASTROCNEMIUS SLIDE;  Surgeon: Colin Rhein, MD;  Location: [facility] ORS;  Service: Orthopedics;  Laterality: Right;  . HERNIA REPAIR    . LEAD REVISION N/A 12/18/2013   Procedure: LEAD REVISION;  Surgeon: Evans Lance, MD;  Location: [facility] CATH LAB;  Service: Cardiovascular;  Laterality: N/A;  . PACEMAKER INSERTION  2006   Dual chamber MDT pacemaker implanted - subsequent upgrade to CRTD 12-2013  .  POSTERIOR LUMBAR FUSION  2006; 2008  . SIGMOIDOSCOPY  MAY 2011 w/ PROP   iTCS-->FSIG-poor bowel prep  . TUBAL LIGATION  ~ 1984  . UPPER GASTROINTESTINAL ENDOSCOPY  MAY 2011 w/ PROP   MILD GASTRITIS 2o ETOH    OB History    Gravida Para Term Preterm AB Living   1 1           SAB TAB Ectopic Multiple Live Births           1       Home Medications    Prior to Admission  medications   Medication Sig Start Date End Date Taking? Authorizing Provider  ALPRAZolam Duanne Moron) 0.5 MG tablet Take 1 tablet by mouth 4 (four) times daily as needed for anxiety. 07/13/16  Yes Historical Provider, MD  Artificial Tear Solution (SYSTANE CONTACTS OP) Place 1 drop into both eyes 2 (two) times daily.   Yes Historical Provider, MD  aspirin EC 81 MG tablet Take 81 mg by mouth daily.   Yes Historical Provider, MD  atorvastatin (LIPITOR) 40 MG tablet Take 40 mg by mouth every morning.    Yes Historical Provider, MD  buPROPion (WELLBUTRIN XL) 150 MG 24 hr tablet Take 150 mg by mouth every morning.   Yes Historical Provider, MD  carvedilol (COREG) 6.25 MG tablet TAKE ONE TABLET BY MOUTH TWICE A DAY WITH A MEAL 12/04/15  Yes Evans Lance, MD  digoxin (LANOXIN) 0.125 MG tablet Take 1 tablet (125 mcg total) by mouth daily. 06/14/12  Yes Evans Lance, MD  furosemide (LASIX) 40 MG tablet Take 120 mg by mouth daily.   Yes Historical Provider, MD  HYDROcodone-acetaminophen (NORCO) 10-325 MG tablet Take 1-2 tablets by mouth every 6 (six) hours as needed for moderate pain.   Yes Historical Provider, MD  Insulin Detemir (LEVEMIR FLEXTOUCH) 100 UNIT/ML Pen Inject 46 Units into the skin at bedtime. 09/10/16  Yes Cassandria Anger, MD  losartan (COZAAR) 100 MG tablet Take 100 mg by mouth every morning.  05/22/13  Yes Historical Provider, MD  metFORMIN (GLUCOPHAGE) 500 MG tablet Take 1 tablet (500 mg total) by mouth 2 (two) times daily with a meal. 03/20/16  Yes Cassandria Anger, MD  omeprazole (PRILOSEC) 20 MG capsule Take 20 mg by mouth 2 (two) times daily.    Yes Historical Provider, MD  potassium chloride SA (K-DUR,KLOR-CON) 20 MEQ tablet Take 20 mEq by mouth 2 (two) times daily.    Yes Historical Provider, MD  venlafaxine (EFFEXOR-XR) 150 MG 24 hr capsule Take 300 mg by mouth every morning.    Yes Historical Provider, MD  zolpidem (AMBIEN) 10 MG tablet Take 20 mg by mouth at bedtime.    Yes  Historical Provider, MD  blood glucose meter kit and supplies KIT Dispense based on patient and insurance preference. Use up to four times daily as directed. (FOR ICD-10 E10.65) 06/04/16   Cassandria Anger, MD  cephALEXin (KEFLEX) 500 MG capsule Take 1 capsule by mouth 3 (three) times daily. 09/15/16   Historical Provider, MD  glucose blood (BAYER CONTOUR NEXT TEST) test strip Use as instructed 4 x daily. e11.65 06/02/16   Cassandria Anger, MD  metoCLOPramide (REGLAN) 10 MG tablet Take 1 tablet (10 mg total) by mouth every 6 (six) hours as needed for nausea (nausea/headache). 09/18/16   Merrily Pew, MD  UNIFINE PENTIPS 32G X 4 MM MISC USE ONCE AT BEDTIME 07/31/15   Cassandria Anger, MD  Family History Family History  Problem Relation Age of Onset  . Alzheimer's disease Mother   . Diabetes Mother   . Cancer Father   . Cancer Sister     breast  . Diabetes Sister   . Diabetes Brother   . Cancer Brother     brain tumor  . Colon cancer Neg Hx   . Colon polyps Neg Hx     Social History Social History  Substance Use Topics  . Smoking status: Never Smoker  . Smokeless tobacco: Never Used  . Alcohol use 0.0 oz/week     Comment: drinks beer or wine three times a week     Allergies   Patient has no known allergies.   Review of Systems Review of Systems  Constitutional: Negative for fever.  Respiratory: Negative for shortness of breath.   Cardiovascular: Negative for chest pain.  Gastrointestinal: Positive for vomiting.  Musculoskeletal: Positive for back pain.  All other systems reviewed and are negative.    Physical Exam Updated Vital Signs BP 104/64 (BP Location: Right Arm)   Pulse 110   Temp 98.3 F (36.8 C) (Oral)   Resp 18   Ht 5' 1" (1.549 m)   Wt 140 lb (63.5 kg)   SpO2 100%   BMI 26.45 kg/m   Physical Exam  Constitutional: She is oriented to person, place, and time. She appears distressed.  HENT:  Head: Normocephalic and atraumatic.  Eyes:  Conjunctivae and EOM are normal.  Neck: Normal range of motion. Neck supple.  Cardiovascular: Regular rhythm.  Tachycardia present.  Exam reveals no gallop and no friction rub.   No murmur heard. Tachycardia. No obvious signs of rubs or gallops but heard to assess. No lower extremity swelling.  Pulmonary/Chest: Effort normal. She has no wheezes. She has no rales.  Abdominal: Soft. She exhibits no distension and no mass. There is no tenderness. There is no guarding.  Musculoskeletal: Normal range of motion.  Neurological: She is alert and oriented to person, place, and time.  Skin: Skin is warm and dry. No erythema. No pallor.  Psychiatric: She has a normal mood and affect.     ED Treatments / Results  DIAGNOSTIC STUDIES: Oxygen Saturation is 100% on RA, normal by my interpretation.    COORDINATION OF CARE: 11:18 AM Discussed treatment plan with pt at bedside and pt agreed to plan.  Labs (all labs ordered are listed, but only abnormal results are displayed) Labs Reviewed  COMPREHENSIVE METABOLIC PANEL - Abnormal; Notable for the following:       Result Value   CO2 20 (*)    Glucose, Bld 351 (*)    BUN 26 (*)    Creatinine, Ser 1.23 (*)    Calcium 8.8 (*)    GFR calc non Af Amer 46 (*)    GFR calc Af Amer 53 (*)    All other components within normal limits  URINALYSIS, ROUTINE W REFLEX MICROSCOPIC - Abnormal; Notable for the following:    Glucose, UA 50 (*)    Leukocytes, UA SMALL (*)    All other components within normal limits  LIPASE, BLOOD  CBC    EKG  EKG Interpretation  Date/Time:  Friday September 18 2016 11:15:42 EST Ventricular Rate:  141 PR Interval:    QRS Duration: 136 QT Interval:  353 QTC Calculation: 541 R Axis:   -19 Text Interpretation:  Sinus tachycardia Left bundle branch block changes likely 2/2 LBBB, unsure if paced Confirmed by [facility] MD,  Jasper Hanf [phone]) on 09/18/2016 11:26:09 AM       Radiology Dg Chest 2 View  Result Date:  09/18/2016 CLINICAL DATA:  Vomiting since this morning EXAM: CHEST  2 VIEW COMPARISON:  12/19/2013 FINDINGS: There is no focal parenchymal opacity. There is no pleural effusion or pneumothorax. The heart and mediastinal contours are unremarkable. There is a 3 lead cardiac pacemaker. The osseous structures are unremarkable. IMPRESSION: No active cardiopulmonary disease. Electronically Signed   By: Kathreen Devoid   On: 09/18/2016 12:12    Procedures Procedures (including critical care time)  Medications Ordered in ED Medications  ondansetron (ZOFRAN) 4 MG/2ML injection (not administered)  sodium chloride 0.9 % bolus 1,000 mL (0 mLs Intravenous Stopped 09/18/16 1249)  ondansetron (ZOFRAN) injection 4 mg (4 mg Intravenous Given 09/18/16 1131)  acetaminophen (TYLENOL) tablet 650 mg (650 mg Oral Given 09/18/16 1149)  ketorolac (TORADOL) 30 MG/ML injection 15 mg (15 mg Intravenous Given 09/18/16 1150)  metoCLOPramide (REGLAN) injection 10 mg (10 mg Intravenous Given 09/18/16 1304)  sodium chloride 0.9 % bolus 500 mL (0 mLs Intravenous Stopped 09/18/16 1514)     Initial Impression / Assessment and Plan / ED Course  I have reviewed the triage vital signs and the nursing notes.  Pertinent labs & imaging results that were available during my care of the patient were reviewed by me and considered in my medical decision making (see chart for details).  Clinical Course     Patient with likely gastroenteritis. Patient was tolerating by mouth liquids without difficulty at time of discharge. Her heart rate improved from 140s to as low as 101. Suspect this was related to dehydration and fever. I suspect this will continue to improve. Abdominal exam benign so I suspect abdominal symptoms related to vomiting, rather than other way around. doub appy, obstruction or other ermergent causes at this time. I did not want to give her too much IV fluids as she has a history of heart failure with EF in the 30-35% range. Plan for  discharge on Reglan and will continue hydration. Will follow up with primary doctor if not improving otherwise return here for any new or worsening symptoms.  Final Clinical Impressions(s) / ED Diagnoses   Final diagnoses:  Dehydration  Nausea vomiting and diarrhea    New Prescriptions Discharge Medication List as of 09/18/2016  3:00 PM    START taking these medications   Details  metoCLOPramide (REGLAN) 10 MG tablet Take 1 tablet (10 mg total) by mouth every 6 (six) hours as needed for nausea (nausea/headache)., Starting Fri 09/18/2016, Print       I personally performed the services described in this documentation, which was scribed in my presence. The recorded information has been reviewed and is accurate.   Merrily Pew, MD 09/18/16 [phone]

## 2016-09-21 ENCOUNTER — Ambulatory Visit: Payer: Medicare Other | Admitting: "Endocrinology

## 2016-09-30 ENCOUNTER — Ambulatory Visit: Payer: Medicare Other | Admitting: "Endocrinology

## 2016-11-02 ENCOUNTER — Encounter (HOSPITAL_COMMUNITY): Payer: Self-pay | Admitting: *Deleted

## 2016-11-02 ENCOUNTER — Emergency Department (HOSPITAL_COMMUNITY)
Admission: EM | Admit: 2016-11-02 | Discharge: 2016-11-02 | Disposition: A | Discharge status: Home / Self Care | Payer: Medicare Other | Attending: Dermatology | Admitting: Dermatology

## 2016-11-02 DIAGNOSIS — Z794 Long term (current) use of insulin: Secondary | ICD-10-CM | POA: Diagnosis not present

## 2016-11-02 DIAGNOSIS — Z5321 Procedure and treatment not carried out due to patient leaving prior to being seen by health care provider: Secondary | ICD-10-CM | POA: Diagnosis not present

## 2016-11-02 DIAGNOSIS — I10 Essential (primary) hypertension: Secondary | ICD-10-CM | POA: Diagnosis not present

## 2016-11-02 DIAGNOSIS — F329 Major depressive disorder, single episode, unspecified: Secondary | ICD-10-CM | POA: Diagnosis not present

## 2016-11-02 DIAGNOSIS — Z7982 Long term (current) use of aspirin: Secondary | ICD-10-CM | POA: Insufficient documentation

## 2016-11-02 DIAGNOSIS — E119 Type 2 diabetes mellitus without complications: Secondary | ICD-10-CM | POA: Insufficient documentation

## 2016-11-02 LAB — CBG MONITORING, ED: Glucose-Capillary: 144 mg/dL — ABNORMAL HIGH (ref 65–99)

## 2016-11-02 NOTE — ED Notes (Signed)
Notified by registration that patient left.  

## 2016-11-02 NOTE — ED Triage Notes (Signed)
Pt crying during triage. Family member states that she lost her brother few weeks ago.  Pt denies SI

## 2016-11-02 NOTE — ED Notes (Signed)
Pt called to back to be roomed but no answer.

## 2016-11-05 ENCOUNTER — Emergency Department (HOSPITAL_COMMUNITY)
Admission: EM | Admit: 2016-11-05 | Discharge: 2016-11-05 | Disposition: A | Discharge status: Home / Self Care | Payer: Medicare Other | Attending: Emergency Medicine | Admitting: Emergency Medicine

## 2016-11-05 ENCOUNTER — Encounter (HOSPITAL_COMMUNITY): Payer: Self-pay | Admitting: *Deleted

## 2016-11-05 ENCOUNTER — Emergency Department (HOSPITAL_COMMUNITY): Payer: Medicare Other

## 2016-11-05 DIAGNOSIS — R42 Dizziness and giddiness: Secondary | ICD-10-CM | POA: Diagnosis not present

## 2016-11-05 DIAGNOSIS — Z7982 Long term (current) use of aspirin: Secondary | ICD-10-CM | POA: Insufficient documentation

## 2016-11-05 DIAGNOSIS — I10 Essential (primary) hypertension: Secondary | ICD-10-CM | POA: Insufficient documentation

## 2016-11-05 DIAGNOSIS — E876 Hypokalemia: Secondary | ICD-10-CM | POA: Diagnosis not present

## 2016-11-05 DIAGNOSIS — E871 Hypo-osmolality and hyponatremia: Secondary | ICD-10-CM | POA: Insufficient documentation

## 2016-11-05 DIAGNOSIS — E860 Dehydration: Secondary | ICD-10-CM | POA: Diagnosis not present

## 2016-11-05 DIAGNOSIS — Z5181 Encounter for therapeutic drug level monitoring: Secondary | ICD-10-CM | POA: Diagnosis not present

## 2016-11-05 DIAGNOSIS — E162 Hypoglycemia, unspecified: Secondary | ICD-10-CM

## 2016-11-05 DIAGNOSIS — E11649 Type 2 diabetes mellitus with hypoglycemia without coma: Secondary | ICD-10-CM | POA: Diagnosis not present

## 2016-11-05 DIAGNOSIS — R269 Unspecified abnormalities of gait and mobility: Secondary | ICD-10-CM | POA: Diagnosis not present

## 2016-11-05 DIAGNOSIS — Z79899 Other long term (current) drug therapy: Secondary | ICD-10-CM | POA: Insufficient documentation

## 2016-11-05 DIAGNOSIS — I6789 Other cerebrovascular disease: Secondary | ICD-10-CM | POA: Diagnosis not present

## 2016-11-05 DIAGNOSIS — Z794 Long term (current) use of insulin: Secondary | ICD-10-CM | POA: Insufficient documentation

## 2016-11-05 DIAGNOSIS — R531 Weakness: Secondary | ICD-10-CM | POA: Diagnosis not present

## 2016-11-05 DIAGNOSIS — R51 Headache: Secondary | ICD-10-CM | POA: Insufficient documentation

## 2016-11-05 DIAGNOSIS — F329 Major depressive disorder, single episode, unspecified: Secondary | ICD-10-CM | POA: Insufficient documentation

## 2016-11-05 DIAGNOSIS — D649 Anemia, unspecified: Secondary | ICD-10-CM | POA: Insufficient documentation

## 2016-11-05 DIAGNOSIS — F339 Major depressive disorder, recurrent, unspecified: Secondary | ICD-10-CM

## 2016-11-05 LAB — CBC
HCT: 24 % — ABNORMAL LOW (ref 36.0–46.0)
HCT: 24.4 % — ABNORMAL LOW (ref 36.0–46.0)
HEMOGLOBIN: 8.4 g/dL — AB (ref 12.0–15.0)
Hemoglobin: 8.3 g/dL — ABNORMAL LOW (ref 12.0–15.0)
MCH: 28.7 pg (ref 26.0–34.0)
MCH: 28.7 pg (ref 26.0–34.0)
MCHC: 34.6 g/dL (ref 30.0–36.0)
MCHC: 34.4 g/dL (ref 30.0–36.0)
MCV: 83 fL (ref 78.0–100.0)
MCV: 83.3 fL (ref 78.0–100.0)
PLATELETS: 166 10*3/uL (ref 150–400)
Platelets: 176 10*3/uL (ref 150–400)
RBC: 2.89 MIL/uL — ABNORMAL LOW (ref 3.87–5.11)
RBC: 2.93 MIL/uL — AB (ref 3.87–5.11)
RDW: 15.5 % (ref 11.5–15.5)
RDW: 15.5 % (ref 11.5–15.5)
WBC: 9 10*3/uL (ref 4.0–10.5)
WBC: 9.7 10*3/uL (ref 4.0–10.5)

## 2016-11-05 LAB — URINALYSIS, ROUTINE W REFLEX MICROSCOPIC
Bilirubin Urine: NEGATIVE
GLUCOSE, UA: NEGATIVE mg/dL
Ketones, ur: NEGATIVE mg/dL
Nitrite: NEGATIVE
PH: 5 (ref 5.0–8.0)
Protein, ur: NEGATIVE mg/dL
Specific Gravity, Urine: 1.004 — ABNORMAL LOW (ref 1.005–1.030)

## 2016-11-05 LAB — RETICULOCYTES
RBC.: 3.12 MIL/uL — AB (ref 3.87–5.11)
Retic Ct Pct: <0.4 % — ABNORMAL LOW (ref 0.4–3.1)

## 2016-11-05 LAB — RAPID URINE DRUG SCREEN, HOSP PERFORMED
Amphetamines: NOT DETECTED
BENZODIAZEPINES: POSITIVE — AB
Barbiturates: NOT DETECTED
COCAINE: NOT DETECTED
OPIATES: NOT DETECTED
Tetrahydrocannabinol: NOT DETECTED

## 2016-11-05 LAB — BASIC METABOLIC PANEL
ANION GAP: 9 (ref 5–15)
Anion gap: 12 (ref 5–15)
BUN: 43 mg/dL — ABNORMAL HIGH (ref 6–20)
BUN: 44 mg/dL — AB (ref 6–20)
CALCIUM: 8.3 mg/dL — AB (ref 8.9–10.3)
CHLORIDE: 103 mmol/L (ref 101–111)
CHLORIDE: 95 mmol/L — AB (ref 101–111)
CO2: 17 mmol/L — ABNORMAL LOW (ref 22–32)
CO2: 18 mmol/L — ABNORMAL LOW (ref 22–32)
CREATININE: 2.58 mg/dL — AB (ref 0.44–1.00)
Calcium: 7.4 mg/dL — ABNORMAL LOW (ref 8.9–10.3)
Creatinine, Ser: 2.34 mg/dL — ABNORMAL HIGH (ref 0.44–1.00)
GFR calc non Af Amer: 19 mL/min — ABNORMAL LOW (ref >60)
GFR calc non Af Amer: 21 mL/min — ABNORMAL LOW (ref >60)
GFR, EST AFRICAN AMERICAN: 21 mL/min — AB (ref >60)
GFR, EST AFRICAN AMERICAN: 24 mL/min — AB (ref >60)
Glucose, Bld: 230 mg/dL — ABNORMAL HIGH (ref 65–99)
Glucose, Bld: 86 mg/dL (ref 65–99)
POTASSIUM: 3.1 mmol/L — AB (ref 3.5–5.1)
Potassium: 3.5 mmol/L (ref 3.5–5.1)
SODIUM: 130 mmol/L — AB (ref 135–145)
Sodium: 124 mmol/L — ABNORMAL LOW (ref 135–145)

## 2016-11-05 LAB — HEPATIC FUNCTION PANEL
ALBUMIN: 2.7 g/dL — AB (ref 3.5–5.0)
ALK PHOS: 100 U/L (ref 38–126)
ALT: 91 U/L — ABNORMAL HIGH (ref 14–54)
AST: 156 U/L — AB (ref 15–41)
BILIRUBIN DIRECT: 0.2 mg/dL (ref 0.1–0.5)
BILIRUBIN TOTAL: 0.7 mg/dL (ref 0.3–1.2)
Indirect Bilirubin: 0.5 mg/dL (ref 0.3–0.9)
Total Protein: 6.9 g/dL (ref 6.5–8.1)

## 2016-11-05 LAB — IRON AND TIBC
Iron: 20 ug/dL — ABNORMAL LOW (ref 28–170)
SATURATION RATIOS: 7 % — AB (ref 10.4–31.8)
TIBC: 281 ug/dL (ref 250–450)
UIBC: 261 ug/dL

## 2016-11-05 LAB — FERRITIN: Ferritin: 282 ng/mL (ref 11–307)

## 2016-11-05 LAB — FOLATE: FOLATE: 19.4 ng/mL (ref >5.9)

## 2016-11-05 LAB — VITAMIN B12: VITAMIN B 12: 2012 pg/mL — AB (ref 180–914)

## 2016-11-05 LAB — CBG MONITORING, ED
GLUCOSE-CAPILLARY: 147 mg/dL — AB (ref 65–99)
GLUCOSE-CAPILLARY: 238 mg/dL — AB (ref 65–99)
Glucose-Capillary: 35 mg/dL — CL (ref 65–99)

## 2016-11-05 LAB — PROTIME-INR
INR: 1.09
Prothrombin Time: 14.1 seconds (ref 11.4–15.2)

## 2016-11-05 LAB — POC OCCULT BLOOD, ED: FECAL OCCULT BLD: NEGATIVE

## 2016-11-05 LAB — ETHANOL: Alcohol, Ethyl (B): <5 mg/dL (ref <5)

## 2016-11-05 LAB — SALICYLATE LEVEL: Salicylate Lvl: <7 mg/dL (ref 2.8–30.0)

## 2016-11-05 LAB — APTT: aPTT: 32 seconds (ref 24–36)

## 2016-11-05 LAB — ACETAMINOPHEN LEVEL: Acetaminophen (Tylenol), Serum: <10 ug/mL — ABNORMAL LOW (ref 10–30)

## 2016-11-05 MED ORDER — SODIUM CHLORIDE 0.9 % IV BOLUS (SEPSIS)
1000.0000 mL | Freq: Once | INTRAVENOUS | Status: AC
  Administered 2016-11-05: 1000 mL via INTRAVENOUS

## 2016-11-05 MED ORDER — DEXTROSE 50 % IV SOLN
INTRAVENOUS | Status: AC
Start: 2016-11-05 — End: 2016-11-05
  Administered 2016-11-05: 50 mL
  Filled 2016-11-05: qty 50

## 2016-11-05 MED ORDER — FERROUS SULFATE 325 (65 FE) MG PO TABS: 325.0000 mg | ORAL_TABLET | Freq: Three times a day (TID) | ORAL | 0 refills | Status: DC

## 2016-11-05 NOTE — ED Notes (Signed)
Pt ambulatory to restroom,

## 2016-11-05 NOTE — ED Notes (Signed)
Pt sitting up in chair, skin clammy, cool to touch, pt states "I think my blood sugar has dropped" pt assisted back to bed, iv restarted, Dr Tomi Bamberger notified, medication given as ordered,

## 2016-11-05 NOTE — ED Notes (Signed)
ED Provider at bedside. 

## 2016-11-05 NOTE — ED Triage Notes (Addendum)
Pt presents to er with [facility] for generalized weakness, no appetite, depression, states that she has recently lost her brother, denies any SI or HI, pt tearful during triage and confused, states "today is Saturday"

## 2016-11-05 NOTE — ED Notes (Signed)
Pt updated on plan of care,  

## 2016-11-05 NOTE — ED Provider Notes (Signed)
[facility] DEPT Provider Note   CSN: [id number] Arrival date & time: 11/05/16  0024  TIme seen 01:32 AM   History   Chief Complaint Chief Complaint  Patient presents with  . Weakness    HPI [name] is a 66 y.o. female.  HPI  patient states she has not been eating for the past 7-9 days. She states she has no appetite and when she smells food makes her feel very nauseated. She has not had vomiting. She does not think she has lost weight. She states she feels like she has been coordinated for the past week. She states she's bumping into things and she is finding bruises on her body that she doesn't know how she got. She states she has a headache on the top of her head. She states "I always get headaches". She states her headaches are like the one she has today. She takes [facility] powders about once a day for the headaches. She feels like the headache is from an irritation in her neck and she feels like she's having tension in her neck which is causing the headaches. Patient reports she started being treated for depression in Nov 01, 1999 after the death of her only child who was 43 in a motor vehicle accident. She states most recently her brother was diagnosed with brain cancer in 08/01/2015 and he died on 10-31-2022 of this year. She states she feels like she is very depressed. She denies suicidal or homicidal ideation. She states she's never had to be admitted for her depression. She was seen a psychiatrist from 11/01/99 through 10/31/2013, Dr. Caprice Beaver, however her insurance changed and she had to quit going. She states her PCP has continued her on her anti-depression medications.  She denies any black stools or rectal bleeding. She denies having periods.   PCP Alonza Bogus, MD   Past Medical History:  Diagnosis Date  . Anxiety   . Cardiomyopathy   . Cervical high risk HPV (human papillomavirus) test positive 01/30/2015  . Chronic constipation   . Chronic lower back pain   . Constipation  01/30/2015  . Depression   . GERD (gastroesophageal reflux disease)   . Heart failure    Chronic combined  . HTN (hypertension)   . Hypercholesteremia   . Neurogenic bladder    2006/10/31 nerve damage s/p back ssurgery  . Type II diabetes mellitus [facility])     Patient Active Problem List   Diagnosis Date Noted  . Essential hypertension, benign 09/12/2015  . Constipation 01/30/2015  . Cervical high risk HPV (human papillomavirus) test positive 01/30/2015  . Biventricular ICD (implantable cardioverter-defibrillator) in place 04/19/2014  . Chronic systolic heart failure ([place]) 12/18/2013  . Preop cardiovascular exam 11/17/2011  . Colon cancer screening 02/04/2011  . Type 2 diabetes mellitus, uncontrolled ([place]) 12/31/2009  . Hyperlipidemia 12/31/2009  . ANXIETY DEPRESSION 12/31/2009  . GASTROESOPHAGEAL REFLUX DISEASE, CHRONIC 12/31/2009  . CONSTIPATION, CHRONIC 12/31/2009  . BACK PAIN, CHRONIC 12/31/2009  . CARDIOMYOPATHY 05/22/2009  . HYPERTENSION, HEART CONTROLLED W/O ASSOC CHF 11/27/2008  . COMBINED HEART FAILURE, CHRONIC 11/27/2008  . PACEMAKER, PERMANENT 11/27/2008    Past Surgical History:  Procedure Laterality Date  . ACHILLES TENDON SURGERY Right Jul 31, 2012  . BACK SURGERY  2003/11/01  . BI-VENTRICULAR IMPLANTABLE CARDIOVERTER DEFIBRILLATOR  (CRT-D)  12-18-2013   upgrade of previously implanted dual chamber pacemaker to MDT CRTD with RV lead revision by Dr Lovena Le  . BI-VENTRICULAR PACEMAKER UPGRADE  12/18/2013   w/defibrillator  . BIV  PACEMAKER GENERATOR CHANGE OUT N/A 12/18/2013   Procedure: BIV PACEMAKER GENERATOR CHANGE OUT;  Surgeon: Evans Lance, MD;  Location[facility] CATH LAB;  Service: Cardiovascular;  Laterality: N/A;  . BREAST BIOPSY Left 1973   benign  . BREAST LUMPECTOMY Left 1973  . CESAREAN SECTION  1981  . CHOLECYSTECTOMY    . GASTROCNEMIUS RECESSION  06/24/2012   Procedure: GASTROCNEMIUS SLIDE;  Surgeon: Colin Rhein, MD;  Location: [facility] ORS;  Service: Orthopedics;   Laterality: Right;  . HERNIA REPAIR    . LEAD REVISION N/A 12/18/2013   Procedure: LEAD REVISION;  Surgeon: Evans Lance, MD;  Location: [facility] CATH LAB;  Service: Cardiovascular;  Laterality: N/A;  . PACEMAKER INSERTION  2006   Dual chamber MDT pacemaker implanted - subsequent upgrade to CRTD 12-2013  . POSTERIOR LUMBAR FUSION  2006; 2008  . SIGMOIDOSCOPY  MAY 2011 w/ PROP   iTCS-->FSIG-poor bowel prep  . TUBAL LIGATION  ~ 1984  . UPPER GASTROINTESTINAL ENDOSCOPY  MAY 2011 w/ PROP   MILD GASTRITIS 2o ETOH    OB History    Gravida Para Term Preterm AB Living   1 1           SAB TAB Ectopic Multiple Live Births           1       Home Medications    Prior to Admission medications   Medication Sig Start Date End Date Taking? Authorizing Provider  ALPRAZolam Duanne Moron) 0.5 MG tablet Take 1 tablet by mouth 4 (four) times daily as needed for anxiety. 07/13/16  Yes Historical Provider, MD  Artificial Tear Solution (SYSTANE CONTACTS OP) Place 1 drop into both eyes 2 (two) times daily.   Yes Historical Provider, MD  aspirin EC 81 MG tablet Take 81 mg by mouth daily.   Yes Historical Provider, MD  atorvastatin (LIPITOR) 40 MG tablet Take 40 mg by mouth every morning.    Yes Historical Provider, MD  blood glucose meter kit and supplies KIT Dispense based on patient and insurance preference. Use up to four times daily as directed. (FOR ICD-10 E10.65) 06/04/16  Yes Cassandria Anger, MD  buPROPion (WELLBUTRIN XL) 150 MG 24 hr tablet Take 150 mg by mouth every morning.   Yes Historical Provider, MD  carvedilol (COREG) 6.25 MG tablet TAKE ONE TABLET BY MOUTH TWICE A DAY WITH A MEAL 12/04/15  Yes Evans Lance, MD  digoxin (LANOXIN) 0.125 MG tablet Take 1 tablet (125 mcg total) by mouth daily. 06/14/12  Yes Evans Lance, MD  furosemide (LASIX) 40 MG tablet Take 120 mg by mouth daily.   Yes Historical Provider, MD  glucose blood (BAYER CONTOUR NEXT TEST) test strip Use as instructed 4 x daily. e11.65  06/02/16  Yes Cassandria Anger, MD  HYDROcodone-acetaminophen (NORCO) 10-325 MG tablet Take 1-2 tablets by mouth every 6 (six) hours as needed for moderate pain.   Yes Historical Provider, MD  Insulin Detemir (LEVEMIR FLEXTOUCH) 100 UNIT/ML Pen Inject 46 Units into the skin at bedtime. 09/10/16  Yes Cassandria Anger, MD  losartan (COZAAR) 100 MG tablet Take 100 mg by mouth every morning.  05/22/13  Yes Historical Provider, MD  metFORMIN (GLUCOPHAGE) 500 MG tablet Take 1 tablet (500 mg total) by mouth 2 (two) times daily with a meal. 03/20/16  Yes Cassandria Anger, MD  omeprazole (PRILOSEC) 20 MG capsule Take 20 mg by mouth 2 (two) times daily.    Yes Historical Provider,  MD  potassium chloride SA (K-DUR,KLOR-CON) 20 MEQ tablet Take 20 mEq by mouth 2 (two) times daily.    Yes Historical Provider, MD  UNIFINE PENTIPS 32G X 4 MM MISC USE ONCE AT BEDTIME 07/31/15  Yes Gebreselassie W Nida, MD  venlafaxine (EFFEXOR-XR) 150 MG 24 hr capsule Take 300 mg by mouth every morning.    Yes Historical Provider, MD  zolpidem (AMBIEN) 10 MG tablet Take 20 mg by mouth at bedtime.    Yes Historical Provider, MD  cephALEXin (KEFLEX) 500 MG capsule Take 1 capsule by mouth 3 (three) times daily. 09/15/16   Historical Provider, MD  ferrous sulfate 325 (65 FE) MG tablet Take 1 tablet (325 mg total) by mouth 3 (three) times daily with meals. 11/05/16    , MD  metoCLOPramide (REGLAN) 10 MG tablet Take 1 tablet (10 mg total) by mouth every 6 (six) hours as needed for nausea (nausea/headache). 09/18/16   Jason Mesner, MD    Family History Family History  Problem Relation Age of Onset  . Alzheimer's disease Mother   . Diabetes Mother   . Cancer Father   . Cancer Sister     breast  . Diabetes Sister   . Diabetes Brother   . Cancer Brother     brain tumor  . Colon cancer Neg Hx   . Colon polyps Neg Hx     Social History Social History  Substance Use Topics  . Smoking status: Never Smoker  . Smokeless  tobacco: Never Used  . Alcohol use 0.0 oz/week     Comment: drinks beer or wine three times a week  on disability for depression after the death of her son   Allergies   Patient has no known allergies.   Review of Systems Review of Systems  All other systems reviewed and are negative.    Physical Exam Updated Vital Signs BP (!) 104/53   Pulse 81   Temp 97.9 F (36.6 C)   Resp 20   Ht 5' 1" (1.549 m)   Wt 130 lb (59 kg)   SpO2 100%   BMI 24.56 kg/m   Vital signs normal    Physical Exam  Constitutional: She is oriented to person, place, and time. She appears well-developed and well-nourished.  Non-toxic appearance. She does not appear ill. No distress.  HENT:  Head: Normocephalic and atraumatic.  Right Ear: External ear normal.  Left Ear: External ear normal.  Nose: Nose normal. No mucosal edema or rhinorrhea.  Mouth/Throat: Oropharynx is clear and moist and mucous membranes are normal. No dental abscesses or uvula swelling.  Eyes: Conjunctivae and EOM are normal. Pupils are equal, round, and reactive to light.  Neck: Normal range of motion and full passive range of motion without pain. Neck supple.  Cardiovascular: Normal rate, regular rhythm and normal heart sounds.  Exam reveals no gallop and no friction rub.   No murmur heard. Pulmonary/Chest: Effort normal and breath sounds normal. No respiratory distress. She has no wheezes. She has no rhonchi. She has no rales. She exhibits no tenderness and no crepitus.  Abdominal: Soft. Normal appearance and bowel sounds are normal. She exhibits no distension. There is no tenderness. There is no rebound and no guarding.  Musculoskeletal: Normal range of motion. She exhibits no edema or tenderness.  Moves all extremities well.   Neurological: She is alert and oriented to person, place, and time. She has normal strength. No cranial nerve deficit.  Skin: Skin is warm, dry and   intact. No rash noted. No erythema. No pallor.    Psychiatric: Her mood appears not anxious. Her speech is delayed. She is slowed. She exhibits a depressed mood. She expresses no homicidal and no suicidal ideation. She expresses no suicidal plans and no homicidal plans.  sobbing  Nursing note and vitals reviewed.    ED Treatments / Results  Labs (all labs ordered are listed, but only abnormal results are displayed) Results for orders placed or performed during the hospital encounter of 11/05/16  Basic metabolic panel  Result Value Ref Range   Sodium 124 (L) 135 - 145 mmol/L   Potassium 3.5 3.5 - 5.1 mmol/L   Chloride 95 (L) 101 - 111 mmol/L   CO2 17 (L) 22 - 32 mmol/L   Glucose, Bld 230 (H) 65 - 99 mg/dL   BUN 44 (H) 6 - 20 mg/dL   Creatinine, Ser 2.58 (H) 0.44 - 1.00 mg/dL   Calcium 8.3 (L) 8.9 - 10.3 mg/dL   GFR calc non Af Amer 19 (L) >60 mL/min   GFR calc Af Amer 21 (L) >60 mL/min   Anion gap 12 5 - 15  CBC  Result Value Ref Range   WBC 9.7 4.0 - 10.5 K/uL   RBC 2.89 (L) 3.87 - 5.11 MIL/uL   Hemoglobin 8.3 (L) 12.0 - 15.0 g/dL   HCT 24.0 (L) 36.0 - 46.0 %   MCV 83.0 78.0 - 100.0 fL   MCH 28.7 26.0 - 34.0 pg   MCHC 34.6 30.0 - 36.0 g/dL   RDW 15.5 11.5 - 15.5 %   Platelets 166 150 - 400 K/uL  Urinalysis, Routine w reflex microscopic  Result Value Ref Range   Color, Urine YELLOW YELLOW   APPearance HAZY (A) CLEAR   Specific Gravity, Urine 1.004 (L) 1.005 - 1.030   pH 5.0 5.0 - 8.0   Glucose, UA NEGATIVE NEGATIVE mg/dL   Hgb urine dipstick MODERATE (A) NEGATIVE   Bilirubin Urine NEGATIVE NEGATIVE   Ketones, ur NEGATIVE NEGATIVE mg/dL   Protein, ur NEGATIVE NEGATIVE mg/dL   Nitrite NEGATIVE NEGATIVE   Leukocytes, UA LARGE (A) NEGATIVE   RBC / HPF 0-5 0 - 5 RBC/hpf   WBC, UA 6-30 0 - 5 WBC/hpf   Bacteria, UA RARE (A) NONE SEEN   WBC Clumps PRESENT   Hepatic function panel  Result Value Ref Range   Total Protein 6.9 6.5 - 8.1 g/dL   Albumin 2.7 (L) 3.5 - 5.0 g/dL   AST 156 (H) 15 - 41 U/L   ALT 91 (H) 14 - 54  U/L   Alkaline Phosphatase 100 38 - 126 U/L   Total Bilirubin 0.7 0.3 - 1.2 mg/dL   Bilirubin, Direct 0.2 0.1 - 0.5 mg/dL   Indirect Bilirubin 0.5 0.3 - 0.9 mg/dL  Acetaminophen level  Result Value Ref Range   Acetaminophen (Tylenol), Serum <10 (L) 10 - 30 ug/mL  Ethanol  Result Value Ref Range   Alcohol, Ethyl (B) <5 <5 mg/dL  Salicylate level  Result Value Ref Range   Salicylate Lvl <7.0 2.8 - 30.0 mg/dL  Urine rapid drug screen (hosp performed)  Result Value Ref Range   Opiates NONE DETECTED NONE DETECTED   Cocaine NONE DETECTED NONE DETECTED   Benzodiazepines POSITIVE (A) NONE DETECTED   Amphetamines NONE DETECTED NONE DETECTED   Tetrahydrocannabinol NONE DETECTED NONE DETECTED   Barbiturates NONE DETECTED NONE DETECTED  Protime-INR  Result Value Ref Range   Prothrombin Time 14.1 11.4 -   15.2 seconds   INR 1.09   APTT  Result Value Ref Range   aPTT 32 24 - 36 seconds  Basic metabolic panel  Result Value Ref Range   Sodium 130 (L) 135 - 145 mmol/L   Potassium 3.1 (L) 3.5 - 5.1 mmol/L   Chloride 103 101 - 111 mmol/L   CO2 18 (L) 22 - 32 mmol/L   Glucose, Bld 86 65 - 99 mg/dL   BUN 43 (H) 6 - 20 mg/dL   Creatinine, Ser 2.34 (H) 0.44 - 1.00 mg/dL   Calcium 7.4 (L) 8.9 - 10.3 mg/dL   GFR calc non Af Amer 21 (L) >60 mL/min   GFR calc Af Amer 24 (L) >60 mL/min   Anion gap 9 5 - 15  CBC  Result Value Ref Range   WBC 9.0 4.0 - 10.5 K/uL   RBC 2.93 (L) 3.87 - 5.11 MIL/uL   Hemoglobin 8.4 (L) 12.0 - 15.0 g/dL   HCT 24.4 (L) 36.0 - 46.0 %   MCV 83.3 78.0 - 100.0 fL   MCH 28.7 26.0 - 34.0 pg   MCHC 34.4 30.0 - 36.0 g/dL   RDW 15.5 11.5 - 15.5 %   Platelets 176 150 - 400 K/uL  CBG monitoring, ED  Result Value Ref Range   Glucose-Capillary 238 (H) 65 - 99 mg/dL  CBG monitoring, ED  Result Value Ref Range   Glucose-Capillary 35 (LL) 65 - 99 mg/dL  CBG monitoring, ED  Result Value Ref Range   Glucose-Capillary 147 (H) 65 - 99 mg/dL  POC occult blood, ED RN will collect   Result Value Ref Range   Fecal Occult Bld NEGATIVE NEGATIVE   Laboratory interpretation all normal except Hyponatremia which improved after IV fluids, stable renal insufficiency, hypokalemia    EKG  EKG Interpretation None       Radiology Ct Head Wo Contrast  Result Date: 11/05/2016 CLINICAL DATA:  Generalized weakness. EXAM: CT HEAD WITHOUT CONTRAST TECHNIQUE: Contiguous axial images were obtained from the base of the skull through the vertex without intravenous contrast. COMPARISON:  Head CT 08/22/2007 FINDINGS: Brain: No mass lesion, intraparenchymal hemorrhage or extra-axial collection. No evidence of acute cortical infarct. There is periventricular hypoattenuation compatible with chronic microvascular disease. Vascular: No hyperdense vessel or unexpected calcification. Skull: Normal visualized skull base, calvarium and extracranial soft tissues. Sinuses/Orbits: No sinus fluid levels or advanced mucosal thickening. No mastoid effusion. Normal orbits. IMPRESSION: Chronic microvascular ischemia without acute intracranial abnormality. Electronically Signed   By: Kevin  Herman M.D.   On: 11/05/2016 02:10    Procedures Procedures (including critical care time)  Medications Ordered in ED Medications  sodium chloride 0.9 % bolus 1,000 mL (0 mLs Intravenous Stopped 11/05/16 0321)  sodium chloride 0.9 % bolus 1,000 mL (0 mLs Intravenous Stopped 11/05/16 0406)  dextrose 50 % solution (50 mLs  Given 11/05/16 0535)     Initial Impression / Assessment and Plan / ED Course  I have reviewed the triage vital signs and the nursing notes.  Pertinent labs & imaging results that were available during my care of the patient were reviewed by me and considered in my medical decision making (see chart for details).  Patient refused TTS consult. She was given IV fluids, a repeat bmet was done to see if her hyponatremia improved after the IV fluids.  Recheck at 4:50 AM patient states she's feeling  better. She is starting to have urinary output. They're waiting for her second bmet to be   resulted.  5:30 AM Nurse reports patient's mental status seemed to change, her CBG was 35. She was given 1 amp of D50 and then given something to drink and eat.  Patient was again offered mental health evaluation which she has refused. We discussed she now has a new anemia. She denies any known source of bleeding. Patient looks like she is feeling improved. She's no longer tearful and crying. She states she and related to the bathroom without difficulty. Her indices are normal and not indicative of iron deficiency anemia.  Her hemoccult was negative. Anemia panel was ordered. She was started on iron until her anemia panel can be resulted and reviewed by her PCP or hematology. IWe discussed referral to a hematologist for evaluation of her anemia she can follow-up with her primary care. At this point patient was felt stabilized to be discharged.     Orthostatic VS for the past 24 hrs:  BP- Lying Pulse- Lying BP- Sitting Pulse- Sitting BP- Standing at 0 minutes Pulse- Standing at 0 minutes  11/05/16 0806 103/58 85 105/56 90 100/50 90     Orthostatic Vital signs normal    Final Clinical Impressions(s) / ED Diagnoses   Final diagnoses:  Episode of recurrent major depressive disorder, unspecified depression episode severity (HCC)  Dehydration  Hyponatremia  Anemia, unspecified type  Hypoglycemia    New Prescriptions New Prescriptions   FERROUS SULFATE 325 (65 FE) MG TABLET    Take 1 tablet (325 mg total) by mouth 3 (three) times daily with meals.   Plan discharge   , MD, FACEP     , MD 11/05/16 0841  

## 2016-11-05 NOTE — ED Notes (Signed)
Patient ambulatory to restroom without assistance. 

## 2016-11-05 NOTE — ED Notes (Signed)
Patient denies any dizziness or weakness upon sitting and standing - also walking to restroom.

## 2016-11-05 NOTE — ED Notes (Signed)
Pt returned from ct,  

## 2016-11-05 NOTE — ED Notes (Signed)
Pt sitting up in bed, skin warm and dry, states " I am feeling better"

## 2016-11-05 NOTE — ED Notes (Signed)
Pt ambulatory to restroom, tolerated well, reports that she is feeling better,

## 2016-11-05 NOTE — Discharge Instructions (Signed)
Start the iron pills. You can either have Dr Luan Pulling recheck you for the anemia, or see the hematologist here at the hospital (Dr Lindi Adie and [place]). Consider getting a therapist again to help you with your brothers death. You need to monitor your blood sugars closely, you may not need as much insulin if you aren't eating normally.  Return to the ED if he feel like you are going to harm herself or someone else, you started getting dizzy or lightheaded or feel extremely weak, he get chest pain, shortness of breath, or you see blood in your bowel movements or urine.

## 2016-11-07 ENCOUNTER — Encounter (HOSPITAL_COMMUNITY): Payer: Self-pay | Admitting: Emergency Medicine

## 2016-11-07 ENCOUNTER — Emergency Department (HOSPITAL_COMMUNITY)
Admission: EM | Admit: 2016-11-07 | Discharge: 2016-11-07 | Disposition: A | Discharge status: Home / Self Care | Payer: Medicare Other | Attending: Dermatology | Admitting: Dermatology

## 2016-11-07 DIAGNOSIS — E119 Type 2 diabetes mellitus without complications: Secondary | ICD-10-CM | POA: Insufficient documentation

## 2016-11-07 DIAGNOSIS — Z79899 Other long term (current) drug therapy: Secondary | ICD-10-CM | POA: Diagnosis not present

## 2016-11-07 DIAGNOSIS — R202 Paresthesia of skin: Secondary | ICD-10-CM | POA: Diagnosis not present

## 2016-11-07 DIAGNOSIS — R252 Cramp and spasm: Secondary | ICD-10-CM | POA: Diagnosis not present

## 2016-11-07 DIAGNOSIS — I6789 Other cerebrovascular disease: Secondary | ICD-10-CM | POA: Diagnosis not present

## 2016-11-07 DIAGNOSIS — I509 Heart failure, unspecified: Secondary | ICD-10-CM | POA: Diagnosis not present

## 2016-11-07 DIAGNOSIS — I110 Hypertensive heart disease with heart failure: Secondary | ICD-10-CM | POA: Insufficient documentation

## 2016-11-07 DIAGNOSIS — R001 Bradycardia, unspecified: Secondary | ICD-10-CM | POA: Diagnosis not present

## 2016-11-07 DIAGNOSIS — Z7982 Long term (current) use of aspirin: Secondary | ICD-10-CM | POA: Insufficient documentation

## 2016-11-07 DIAGNOSIS — Z794 Long term (current) use of insulin: Secondary | ICD-10-CM | POA: Insufficient documentation

## 2016-11-07 DIAGNOSIS — Z5321 Procedure and treatment not carried out due to patient leaving prior to being seen by health care provider: Secondary | ICD-10-CM | POA: Insufficient documentation

## 2016-11-07 NOTE — ED Triage Notes (Signed)
Pt reports uncontrollable arm and hand twitching since yesterday. Pt appears anxious. Pt does take xanax for anxiety. Pt has pacemaker. NAD noted.

## 2016-11-07 NOTE — ED Notes (Signed)
Called for treatment x 1

## 2016-11-12 DIAGNOSIS — I10 Essential (primary) hypertension: Secondary | ICD-10-CM | POA: Diagnosis not present

## 2016-11-12 DIAGNOSIS — E1165 Type 2 diabetes mellitus with hyperglycemia: Secondary | ICD-10-CM | POA: Diagnosis not present

## 2016-11-12 DIAGNOSIS — I420 Dilated cardiomyopathy: Secondary | ICD-10-CM | POA: Diagnosis not present

## 2016-11-13 ENCOUNTER — Other Ambulatory Visit: Payer: Self-pay | Admitting: Obstetrics and Gynecology

## 2016-11-13 DIAGNOSIS — Z1231 Encounter for screening mammogram for malignant neoplasm of breast: Secondary | ICD-10-CM

## 2016-11-16 ENCOUNTER — Telehealth: Payer: Self-pay | Admitting: "Endocrinology

## 2016-11-16 NOTE — Telephone Encounter (Signed)
Patient calling with her last three AM readings. She is concerned that they are too low.  March 3rd-  53 March 4th- 45 March 5th- 70.  Patient is scheduled to see Dr.Nida on 11-25-16.

## 2016-11-16 NOTE — Telephone Encounter (Signed)
Advise patient to lower her Levemir to 30 units, continue to test blood glucose before meals and at bedtime and bring meter and logs during her next visit.

## 2016-11-16 NOTE — Telephone Encounter (Signed)
Pt.notified

## 2016-11-19 ENCOUNTER — Other Ambulatory Visit: Payer: Self-pay | Admitting: "Endocrinology

## 2016-11-19 DIAGNOSIS — E118 Type 2 diabetes mellitus with unspecified complications: Secondary | ICD-10-CM | POA: Diagnosis not present

## 2016-11-19 DIAGNOSIS — E1165 Type 2 diabetes mellitus with hyperglycemia: Secondary | ICD-10-CM | POA: Diagnosis not present

## 2016-11-19 DIAGNOSIS — Z794 Long term (current) use of insulin: Secondary | ICD-10-CM | POA: Diagnosis not present

## 2016-11-19 LAB — HEMOGLOBIN A1C
Hgb A1c MFr Bld: 9.6 % — ABNORMAL HIGH (ref <5.7)
MEAN PLASMA GLUCOSE: 229 mg/dL

## 2016-11-19 LAB — COMPREHENSIVE METABOLIC PANEL
ALT: 40 U/L — ABNORMAL HIGH (ref 6–29)
AST: 34 U/L (ref 10–35)
Albumin: 3.4 g/dL — ABNORMAL LOW (ref 3.6–5.1)
Alkaline Phosphatase: 127 U/L (ref 33–130)
BILIRUBIN TOTAL: 0.3 mg/dL (ref 0.2–1.2)
BUN: 25 mg/dL (ref 7–25)
CALCIUM: 9 mg/dL (ref 8.6–10.4)
CHLORIDE: 100 mmol/L (ref 98–110)
CO2: 20 mmol/L (ref 20–31)
CREATININE: 1.38 mg/dL — AB (ref 0.50–0.99)
GLUCOSE: 115 mg/dL — AB (ref 65–99)
Potassium: 4 mmol/L (ref 3.5–5.3)
SODIUM: 138 mmol/L (ref 135–146)
Total Protein: 7.1 g/dL (ref 6.1–8.1)

## 2016-11-23 ENCOUNTER — Ambulatory Visit (INDEPENDENT_AMBULATORY_CARE_PROVIDER_SITE_OTHER): Payer: Medicare Other | Admitting: *Deleted

## 2016-11-23 DIAGNOSIS — I255 Ischemic cardiomyopathy: Secondary | ICD-10-CM | POA: Diagnosis not present

## 2016-11-23 DIAGNOSIS — R339 Retention of urine, unspecified: Secondary | ICD-10-CM | POA: Diagnosis not present

## 2016-11-24 NOTE — Progress Notes (Signed)
Remote ICD transmission.   

## 2016-11-25 ENCOUNTER — Ambulatory Visit (HOSPITAL_COMMUNITY)
Admission: RE | Admit: 2016-11-25 | Discharge: 2016-11-25 | Disposition: A | Discharge status: Home / Self Care | Payer: Medicare Other | Source: Ambulatory Visit | Attending: Obstetrics and Gynecology | Admitting: Obstetrics and Gynecology

## 2016-11-25 ENCOUNTER — Ambulatory Visit (INDEPENDENT_AMBULATORY_CARE_PROVIDER_SITE_OTHER): Payer: Medicare Other | Admitting: "Endocrinology

## 2016-11-25 ENCOUNTER — Encounter: Payer: Self-pay | Admitting: Cardiology

## 2016-11-25 ENCOUNTER — Encounter: Payer: Self-pay | Admitting: "Endocrinology

## 2016-11-25 VITALS — BP 109/74 | HR 91 | Ht 61.0 in | Wt 129.0 lb

## 2016-11-25 DIAGNOSIS — E1165 Type 2 diabetes mellitus with hyperglycemia: Secondary | ICD-10-CM | POA: Diagnosis not present

## 2016-11-25 DIAGNOSIS — IMO0002 Reserved for concepts with insufficient information to code with codable children: Secondary | ICD-10-CM

## 2016-11-25 DIAGNOSIS — I10 Essential (primary) hypertension: Secondary | ICD-10-CM

## 2016-11-25 DIAGNOSIS — Z1231 Encounter for screening mammogram for malignant neoplasm of breast: Secondary | ICD-10-CM | POA: Insufficient documentation

## 2016-11-25 DIAGNOSIS — Z794 Long term (current) use of insulin: Secondary | ICD-10-CM | POA: Diagnosis not present

## 2016-11-25 DIAGNOSIS — E782 Mixed hyperlipidemia: Secondary | ICD-10-CM

## 2016-11-25 DIAGNOSIS — E118 Type 2 diabetes mellitus with unspecified complications: Secondary | ICD-10-CM

## 2016-11-25 LAB — CUP PACEART REMOTE DEVICE CHECK
Battery Remaining Longevity: 39 mo
Battery Voltage: 2.96 V
Brady Statistic AP VP Percent: 0.02 %
Brady Statistic AS VP Percent: 97.18 %
Brady Statistic RV Percent Paced: 34.57 %
Date Time Interrogation Session: 20180312083624
HighPow Impedance: 72 Ohm
Implantable Lead Implant Date: 20150406
Implantable Lead Location: 753858
Implantable Lead Location: 753860
Implantable Lead Model: 4396
Implantable Lead Model: 5076
Lead Channel Impedance Value: 342 Ohm
Lead Channel Impedance Value: 418 Ohm
Lead Channel Impedance Value: 475 Ohm
Lead Channel Impedance Value: 703 Ohm
Lead Channel Pacing Threshold Amplitude: 0.625 V
Lead Channel Pacing Threshold Amplitude: 0.875 V
Lead Channel Pacing Threshold Amplitude: 1.5 V
Lead Channel Pacing Threshold Pulse Width: 0.6 ms
Lead Channel Sensing Intrinsic Amplitude: 19.75 mV
Lead Channel Sensing Intrinsic Amplitude: 2.875 mV
Lead Channel Sensing Intrinsic Amplitude: 2.875 mV
Lead Channel Setting Pacing Amplitude: 2.5 V
Lead Channel Setting Pacing Amplitude: 2.5 V
Lead Channel Setting Pacing Pulse Width: 0.6 ms
MDC IDC LEAD IMPLANT DT: 20060214
MDC IDC LEAD IMPLANT DT: 20150406
MDC IDC LEAD LOCATION: 753859
MDC IDC MSMT LEADCHNL LV IMPEDANCE VALUE: 342 Ohm
MDC IDC MSMT LEADCHNL RA IMPEDANCE VALUE: 608 Ohm
MDC IDC MSMT LEADCHNL RA PACING THRESHOLD PULSEWIDTH: 0.4 ms
MDC IDC MSMT LEADCHNL RV PACING THRESHOLD PULSEWIDTH: 0.4 ms
MDC IDC MSMT LEADCHNL RV SENSING INTR AMPL: 19.75 mV
MDC IDC PG IMPLANT DT: 20150406
MDC IDC SET LEADCHNL RA PACING AMPLITUDE: 2 V
MDC IDC SET LEADCHNL RV PACING PULSEWIDTH: 0.4 ms
MDC IDC SET LEADCHNL RV SENSING SENSITIVITY: 0.3 mV
MDC IDC STAT BRADY AP VS PERCENT: 0.01 %
MDC IDC STAT BRADY AS VS PERCENT: 2.78 %
MDC IDC STAT BRADY RA PERCENT PACED: 0.04 %

## 2016-11-25 MED ORDER — INSULIN LISPRO 100 UNIT/ML (KWIKPEN): 5.0000 [IU] | PEN_INJECTOR | Freq: Three times a day (TID) | SUBCUTANEOUS | 2 refills | Status: DC

## 2016-11-25 MED ORDER — INSULIN DETEMIR 100 UNIT/ML FLEXPEN: 30.0000 [IU] | PEN_INJECTOR | Freq: Every day | SUBCUTANEOUS | 2 refills | Status: DC
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
## 2016-11-25 NOTE — Patient Instructions (Signed)

## 2016-11-30 ENCOUNTER — Encounter (HOSPITAL_COMMUNITY): Payer: Self-pay | Admitting: Hematology

## 2016-11-30 ENCOUNTER — Encounter (HOSPITAL_COMMUNITY): Payer: Medicare Other | Attending: Hematology | Admitting: Hematology

## 2016-11-30 ENCOUNTER — Encounter (HOSPITAL_COMMUNITY): Payer: Medicare Other

## 2016-11-30 VITALS — BP 113/51 | HR 91 | Temp 97.7°F | Resp 16 | Wt 137.1 lb

## 2016-11-30 DIAGNOSIS — D649 Anemia, unspecified: Secondary | ICD-10-CM

## 2016-11-30 DIAGNOSIS — I509 Heart failure, unspecified: Secondary | ICD-10-CM | POA: Diagnosis not present

## 2016-11-30 DIAGNOSIS — I10 Essential (primary) hypertension: Secondary | ICD-10-CM | POA: Diagnosis not present

## 2016-11-30 DIAGNOSIS — I119 Hypertensive heart disease without heart failure: Secondary | ICD-10-CM | POA: Diagnosis not present

## 2016-11-30 DIAGNOSIS — I4891 Unspecified atrial fibrillation: Secondary | ICD-10-CM

## 2016-11-30 LAB — COMPREHENSIVE METABOLIC PANEL
ALT: 29 U/L (ref 14–54)
ANION GAP: 8 (ref 5–15)
AST: 25 U/L (ref 15–41)
Albumin: 3.9 g/dL (ref 3.5–5.0)
Alkaline Phosphatase: 103 U/L (ref 38–126)
BILIRUBIN TOTAL: 0.4 mg/dL (ref 0.3–1.2)
BUN: 27 mg/dL — AB (ref 6–20)
CHLORIDE: 98 mmol/L — AB (ref 101–111)
CO2: 27 mmol/L (ref 22–32)
Calcium: 9.4 mg/dL (ref 8.9–10.3)
Creatinine, Ser: 1.39 mg/dL — ABNORMAL HIGH (ref 0.44–1.00)
GFR calc Af Amer: 45 mL/min — ABNORMAL LOW (ref >60)
GFR calc non Af Amer: 39 mL/min — ABNORMAL LOW (ref >60)
Glucose, Bld: 106 mg/dL — ABNORMAL HIGH (ref 65–99)
POTASSIUM: 4 mmol/L (ref 3.5–5.1)
Sodium: 133 mmol/L — ABNORMAL LOW (ref 135–145)
TOTAL PROTEIN: 8 g/dL (ref 6.5–8.1)

## 2016-11-30 LAB — CBC WITH DIFFERENTIAL/PLATELET
BASOS ABS: 0 10*3/uL (ref 0.0–0.1)
BASOS PCT: 1 %
EOS ABS: 0.3 10*3/uL (ref 0.0–0.7)
Eosinophils Relative: 3 %
HEMATOCRIT: 28.4 % — AB (ref 36.0–46.0)
HEMOGLOBIN: 9.1 g/dL — AB (ref 12.0–15.0)
Lymphocytes Relative: 33 %
Lymphs Abs: 2.4 10*3/uL (ref 0.7–4.0)
MCH: 28.8 pg (ref 26.0–34.0)
MCHC: 32 g/dL (ref 30.0–36.0)
MCV: 89.9 fL (ref 78.0–100.0)
MONO ABS: 0.7 10*3/uL (ref 0.1–1.0)
Monocytes Relative: 9 %
NEUTROS ABS: 4 10*3/uL (ref 1.7–7.7)
NEUTROS PCT: 54 %
Platelets: 408 10*3/uL — ABNORMAL HIGH (ref 150–400)
RBC: 3.16 MIL/uL — ABNORMAL LOW (ref 3.87–5.11)
RDW: 16.1 % — ABNORMAL HIGH (ref 11.5–15.5)
WBC: 7.3 10*3/uL (ref 4.0–10.5)

## 2016-11-30 LAB — IRON AND TIBC
IRON: 78 ug/dL (ref 28–170)
SATURATION RATIOS: 22 % (ref 10.4–31.8)
TIBC: 361 ug/dL (ref 250–450)
UIBC: 283 ug/dL

## 2016-11-30 LAB — TYPE AND SCREEN
ABO/RH(D): A POS
ANTIBODY SCREEN: NEGATIVE

## 2016-11-30 LAB — RETICULOCYTES
RBC.: 3.16 MIL/uL — AB (ref 3.87–5.11)
RETIC COUNT ABSOLUTE: 101.1 10*3/uL (ref 19.0–186.0)
RETIC CT PCT: 3.2 % — AB (ref 0.4–3.1)

## 2016-11-30 LAB — FERRITIN: FERRITIN: 58 ng/mL (ref 11–307)

## 2016-11-30 LAB — LACTATE DEHYDROGENASE: LDH: 136 U/L (ref 98–192)

## 2016-11-30 LAB — VITAMIN B12: Vitamin B-12: 701 pg/mL (ref 180–914)
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
## 2016-11-30 NOTE — Patient Instructions (Signed)
[place] at [facility] Discharge Instructions  RECOMMENDATIONS MADE BY THE CONSULTANT AND ANY TEST RESULTS WILL BE SENT TO YOUR REFERRING PHYSICIAN.  You were seen today by Dr. Inetta Fermo today Follow up in 2 weeks with labs See Amy up front for appointments   Thank you for choosing [place] at [facility] to provide your oncology and hematology care.  To afford each patient quality time with our provider, please arrive at least 15 minutes before your scheduled appointment time.    If you have a lab appointment with the [place] please come in thru the  Main Entrance and check in at the main information desk  You need to re-schedule your appointment should you arrive 10 or more minutes late.  We strive to give you quality time with our providers, and arriving late affects you and other patients whose appointments are after yours.  Also, if you no show three or more times for appointments you may be dismissed from the clinic at the providers discretion.     Again, thank you for choosing [facility].  Our hope is that these requests will decrease the amount of time that you wait before being seen by our physicians.       _____________________________________________________________  Should you have questions after your visit to [facility], please contact our office at [phone] between the hours of 8:30 a.m. and 4:30 p.m.  Voicemails left after 4:30 p.m. will not be returned until the following business day.  For prescription refill requests, have your pharmacy contact our office.       Resources For Cancer Patients and their Caregivers ? American Cancer Society: Can assist with transportation, wigs, general needs, runs Look Good Feel Better.        ([phone] 859-1422 ? Cancer Care: Provides financial assistance, online support groups, medication/co-pay assistance.  1-800-813-HOPE (586)590-4840) ? [place] Assists [place] cancer patients and their families through emotional , educational and financial support.  (913)071-9969 ? [place] DSS Where to apply for food stamps, Medicaid and utility assistance. (831)495-0688 ? [facility]: Transportation to medical appointments. 252-364-9176 ? Social Security Administration: May apply for disability if have a Stage IV cancer. 406-642-5011 (214)404-9576 ? [employer], Disability and Transit Services: Assists with nutrition, care and transit needs. [place] Support Programs: @10RELATIVEDAYS @ > Cancer Support Group  2nd Tuesday of the month 1pm-2pm, Journey Room  > Creative Journey  3rd Tuesday of the month 1130am-1pm, Journey Room  > Look Good Feel Better  1st Wednesday of the month 10am-12 noon, Journey Room (Call [place] to register [phone])

## 2016-12-01 LAB — HIV ANTIBODY (ROUTINE TESTING W REFLEX): HIV SCREEN 4TH GENERATION: NONREACTIVE

## 2016-12-01 LAB — THYROID PANEL WITH TSH
Free Thyroxine Index: 1.5 (ref 1.2–4.9)
T3 Uptake Ratio: 24 % (ref 24–39)
T4, Total: 6.3 ug/dL (ref 4.5–12.0)
TSH: 2.58 u[IU]/mL (ref 0.450–4.500)

## 2016-12-01 LAB — KAPPA/LAMBDA LIGHT CHAINS
KAPPA FREE LGHT CHN: 66 mg/L — AB (ref 3.3–19.4)
Kappa, lambda light chain ratio: 2.27 — ABNORMAL HIGH (ref 0.26–1.65)
LAMDA FREE LIGHT CHAINS: 29.1 mg/L — AB (ref 5.7–26.3)

## 2016-12-01 LAB — HAPTOGLOBIN: Haptoglobin: 304 mg/dL — ABNORMAL HIGH (ref 34–200)

## 2016-12-02 LAB — MULTIPLE MYELOMA PANEL, SERUM
ALBUMIN SERPL ELPH-MCNC: 3.6 g/dL (ref 2.9–4.4)
ALPHA 1: 0.4 g/dL (ref 0.0–0.4)
ALPHA2 GLOB SERPL ELPH-MCNC: 1.1 g/dL — AB (ref 0.4–1.0)
Albumin/Glob SerPl: 1 (ref 0.7–1.7)
B-GLOBULIN SERPL ELPH-MCNC: 1.2 g/dL (ref 0.7–1.3)
GAMMA GLOB SERPL ELPH-MCNC: 1.3 g/dL (ref 0.4–1.8)
GLOBULIN, TOTAL: 4 g/dL — AB (ref 2.2–3.9)
IgA: 325 mg/dL (ref 87–352)
IgG (Immunoglobin G), Serum: 1148 mg/dL (ref 700–1600)
IgM, Serum: 102 mg/dL (ref 26–217)
Total Protein ELP: 7.6 g/dL (ref 6.0–8.5)

## 2016-12-02 LAB — PARVOVIRUS B19 ANTIBODY, IGG AND IGM
Parovirus B19 IgG Abs: 4.3 index — ABNORMAL HIGH (ref 0.0–0.8)
Parovirus B19 IgM Abs: 1.1 index — ABNORMAL HIGH (ref 0.0–0.8)

## 2016-12-03 ENCOUNTER — Other Ambulatory Visit: Payer: Self-pay

## 2016-12-03 ENCOUNTER — Other Ambulatory Visit: Payer: Self-pay | Admitting: Internal Medicine

## 2016-12-03 MED ORDER — GLUCOSE BLOOD VI STRP: ORAL_STRIP | 5 refills | Status: DC

## 2016-12-03 MED ORDER — BLOOD GLUCOSE MONITOR KIT: PACK | 0 refills | Status: DC

## 2016-12-07 ENCOUNTER — Encounter: Payer: Self-pay | Admitting: "Endocrinology

## 2016-12-07 ENCOUNTER — Ambulatory Visit (INDEPENDENT_AMBULATORY_CARE_PROVIDER_SITE_OTHER): Payer: Medicare Other | Admitting: "Endocrinology

## 2016-12-07 VITALS — BP 111/70 | HR 101 | Ht 61.0 in | Wt 138.0 lb

## 2016-12-07 DIAGNOSIS — E118 Type 2 diabetes mellitus with unspecified complications: Secondary | ICD-10-CM | POA: Diagnosis not present

## 2016-12-07 DIAGNOSIS — I429 Cardiomyopathy, unspecified: Secondary | ICD-10-CM | POA: Diagnosis not present

## 2016-12-07 DIAGNOSIS — M545 Low back pain: Secondary | ICD-10-CM | POA: Diagnosis not present

## 2016-12-07 DIAGNOSIS — E1165 Type 2 diabetes mellitus with hyperglycemia: Secondary | ICD-10-CM

## 2016-12-07 DIAGNOSIS — Z794 Long term (current) use of insulin: Secondary | ICD-10-CM

## 2016-12-07 DIAGNOSIS — E782 Mixed hyperlipidemia: Secondary | ICD-10-CM | POA: Diagnosis not present

## 2016-12-07 DIAGNOSIS — I10 Essential (primary) hypertension: Secondary | ICD-10-CM

## 2016-12-07 DIAGNOSIS — I420 Dilated cardiomyopathy: Secondary | ICD-10-CM | POA: Diagnosis not present

## 2016-12-07 DIAGNOSIS — IMO0002 Reserved for concepts with insufficient information to code with codable children: Secondary | ICD-10-CM

## 2016-12-07 DIAGNOSIS — N390 Urinary tract infection, site not specified: Secondary | ICD-10-CM | POA: Diagnosis not present

## 2016-12-07 MED ORDER — INSULIN DETEMIR 100 UNIT/ML FLEXPEN: 35.0000 [IU] | PEN_INJECTOR | Freq: Every day | SUBCUTANEOUS | 2 refills | Status: DC

## 2016-12-07 NOTE — Patient Instructions (Signed)
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
## 2016-12-08 LAB — FOLATE RBC
FOLATE, RBC: UNDETERMINED ng/mL
HEMATOCRIT: 27.5 % — AB (ref 34.0–46.6)

## 2016-12-15 ENCOUNTER — Encounter (HOSPITAL_COMMUNITY): Payer: Medicare Other

## 2016-12-15 ENCOUNTER — Encounter (HOSPITAL_COMMUNITY): Payer: Self-pay | Admitting: Oncology

## 2016-12-15 ENCOUNTER — Encounter (HOSPITAL_COMMUNITY): Payer: Medicare Other | Attending: Oncology | Admitting: Oncology

## 2016-12-15 VITALS — BP 133/65 | HR 79 | Temp 97.9°F | Resp 20 | Wt 133.0 lb

## 2016-12-15 DIAGNOSIS — D631 Anemia in chronic kidney disease: Secondary | ICD-10-CM | POA: Diagnosis not present

## 2016-12-15 DIAGNOSIS — D649 Anemia, unspecified: Secondary | ICD-10-CM

## 2016-12-15 DIAGNOSIS — K5900 Constipation, unspecified: Secondary | ICD-10-CM | POA: Diagnosis not present

## 2016-12-15 DIAGNOSIS — N189 Chronic kidney disease, unspecified: Secondary | ICD-10-CM | POA: Insufficient documentation

## 2016-12-15 LAB — CBC WITH DIFFERENTIAL/PLATELET
BASOS PCT: 0 %
Basophils Absolute: 0 10*3/uL (ref 0.0–0.1)
EOS ABS: 0.4 10*3/uL (ref 0.0–0.7)
EOS PCT: 5 %
HCT: 28.7 % — ABNORMAL LOW (ref 36.0–46.0)
HEMOGLOBIN: 9.1 g/dL — AB (ref 12.0–15.0)
LYMPHS ABS: 2.9 10*3/uL (ref 0.7–4.0)
Lymphocytes Relative: 32 %
MCH: 28.5 pg (ref 26.0–34.0)
MCHC: 31.7 g/dL (ref 30.0–36.0)
MCV: 90 fL (ref 78.0–100.0)
Monocytes Absolute: 0.6 10*3/uL (ref 0.1–1.0)
Monocytes Relative: 7 %
Neutro Abs: 5 10*3/uL (ref 1.7–7.7)
Neutrophils Relative %: 56 %
PLATELETS: 387 10*3/uL (ref 150–400)
RBC: 3.19 MIL/uL — ABNORMAL LOW (ref 3.87–5.11)
RDW: 17.1 % — ABNORMAL HIGH (ref 11.5–15.5)
WBC: 9 10*3/uL (ref 4.0–10.5)

## 2016-12-15 LAB — RETICULOCYTES
RBC.: 3.19 MIL/uL — AB (ref 3.87–5.11)
RETIC CT PCT: 2.7 % (ref 0.4–3.1)
Retic Count, Absolute: 86.1 10*3/uL (ref 19.0–186.0)

## 2016-12-15 LAB — COMPREHENSIVE METABOLIC PANEL
ALK PHOS: 110 U/L (ref 38–126)
ALT: 27 U/L (ref 14–54)
ANION GAP: 9 (ref 5–15)
AST: 22 U/L (ref 15–41)
Albumin: 3.5 g/dL (ref 3.5–5.0)
BILIRUBIN TOTAL: 0.4 mg/dL (ref 0.3–1.2)
BUN: 31 mg/dL — ABNORMAL HIGH (ref 6–20)
CALCIUM: 9.1 mg/dL (ref 8.9–10.3)
CO2: 24 mmol/L (ref 22–32)
Chloride: 102 mmol/L (ref 101–111)
Creatinine, Ser: 1.77 mg/dL — ABNORMAL HIGH (ref 0.44–1.00)
GFR, EST AFRICAN AMERICAN: 34 mL/min — AB (ref >60)
GFR, EST NON AFRICAN AMERICAN: 29 mL/min — AB (ref >60)
Glucose, Bld: 108 mg/dL — ABNORMAL HIGH (ref 65–99)
POTASSIUM: 4.7 mmol/L (ref 3.5–5.1)
Sodium: 135 mmol/L (ref 135–145)
TOTAL PROTEIN: 7.8 g/dL (ref 6.5–8.1)

## 2016-12-15 NOTE — Patient Instructions (Signed)
[facility] at [facility] Discharge Instructions  RECOMMENDATIONS MADE BY THE CONSULTANT AND ANY TEST RESULTS WILL BE SENT TO YOUR REFERRING PHYSICIAN.  You were seen today by Dr. Louise Zhou Follow up in 3 months with lab work See Amy up front for appointments   Thank you for choosing [facility] at [facility] to provide your oncology and hematology care.  To afford each patient quality time with our provider, please arrive at least 15 minutes before your scheduled appointment time.    If you have a lab appointment with the Cancer Center please come in thru the  Main Entrance and check in at the main information desk  You need to re-schedule your appointment should you arrive 10 or more minutes late.  We strive to give you quality time with our providers, and arriving late affects you and other patients whose appointments are after yours.  Also, if you no show three or more times for appointments you may be dismissed from the clinic at the providers discretion.     Again, thank you for choosing [facility].  Our hope is that these requests will decrease the amount of time that you wait before being seen by our physicians.       _____________________________________________________________  Should you have questions after your visit to  Cancer Center, please contact our office at [phone] between the hours of 8:30 a.m. and 4:30 p.m.  Voicemails left after 4:30 p.m. will not be returned until the following business day.  For prescription refill requests, have your pharmacy contact our office.       Resources For Cancer Patients and their Caregivers ? American Cancer Society: Can assist with transportation, wigs, general needs, runs Look Good Feel Better.        1-888-227-6333 ? Cancer Care: Provides financial assistance, online support groups, medication/co-pay assistance.  1-800-813-HOPE (4673) ? [facility] Assists [place] cancer patients and their families through emotional , educational and financial support.  336-427-4357 ? [place] DSS Where to apply for food stamps, Medicaid and utility assistance. 336-342-1394 ? [facility]: Transportation to medical appointments. 336-347-2287 ? Social Security Administration: May apply for disability if have a Stage IV cancer. 336-342-7796 1-800-772-1213 ? [place] Aging, Disability and Transit Services: Assists with nutrition, care and transit needs. 336-349-2343  Cancer Center Support Programs: @10RELATIVEDAYS@ > Cancer Support Group  2nd Tuesday of the month 1pm-2pm, Journey Room  > Creative Journey  3rd Tuesday of the month 1130am-1pm, Journey Room  > Look Good Feel Better  1st Wednesday of the month 10am-12 noon, Journey Room (Call American Cancer Society to register 1-800-395-5775)    
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
## 2016-12-16 ENCOUNTER — Encounter (HOSPITAL_COMMUNITY): Payer: Self-pay | Admitting: Oncology

## 2016-12-16 NOTE — Progress Notes (Unsigned)
Faxed patient records for referral to Dr Mechele Collin for CKD.

## 2017-01-04 ENCOUNTER — Other Ambulatory Visit: Payer: Self-pay | Admitting: "Endocrinology

## 2017-01-04 ENCOUNTER — Encounter: Payer: Self-pay | Admitting: "Endocrinology

## 2017-01-04 ENCOUNTER — Ambulatory Visit (INDEPENDENT_AMBULATORY_CARE_PROVIDER_SITE_OTHER): Payer: Medicare Other | Admitting: "Endocrinology

## 2017-01-04 VITALS — BP 127/70 | HR 74 | Ht 61.0 in | Wt 134.0 lb

## 2017-01-04 DIAGNOSIS — E782 Mixed hyperlipidemia: Secondary | ICD-10-CM

## 2017-01-04 DIAGNOSIS — E1165 Type 2 diabetes mellitus with hyperglycemia: Secondary | ICD-10-CM

## 2017-01-04 DIAGNOSIS — Z794 Long term (current) use of insulin: Secondary | ICD-10-CM | POA: Diagnosis not present

## 2017-01-04 DIAGNOSIS — E1122 Type 2 diabetes mellitus with diabetic chronic kidney disease: Secondary | ICD-10-CM | POA: Diagnosis not present

## 2017-01-04 DIAGNOSIS — I10 Essential (primary) hypertension: Secondary | ICD-10-CM | POA: Diagnosis not present

## 2017-01-04 DIAGNOSIS — N183 Chronic kidney disease, stage 3 (moderate): Secondary | ICD-10-CM

## 2017-01-04 DIAGNOSIS — IMO0002 Reserved for concepts with insufficient information to code with codable children: Secondary | ICD-10-CM

## 2017-01-04 MED ORDER — INSULIN LISPRO 100 UNIT/ML (KWIKPEN): 8.0000 [IU] | PEN_INJECTOR | Freq: Three times a day (TID) | SUBCUTANEOUS | 2 refills | Status: DC

## 2017-01-04 MED ORDER — INSULIN DETEMIR 100 UNIT/ML FLEXPEN: 40.0000 [IU] | PEN_INJECTOR | Freq: Every day | SUBCUTANEOUS | 2 refills | Status: DC
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
## 2017-01-27 ENCOUNTER — Other Ambulatory Visit (HOSPITAL_COMMUNITY): Payer: Self-pay | Admitting: Pulmonary Disease

## 2017-01-27 DIAGNOSIS — M545 Low back pain: Secondary | ICD-10-CM | POA: Diagnosis not present

## 2017-01-27 DIAGNOSIS — N185 Chronic kidney disease, stage 5: Secondary | ICD-10-CM

## 2017-01-27 DIAGNOSIS — E1121 Type 2 diabetes mellitus with diabetic nephropathy: Secondary | ICD-10-CM | POA: Diagnosis not present

## 2017-01-27 DIAGNOSIS — H25013 Cortical age-related cataract, bilateral: Secondary | ICD-10-CM | POA: Diagnosis not present

## 2017-01-27 DIAGNOSIS — I129 Hypertensive chronic kidney disease with stage 1 through stage 4 chronic kidney disease, or unspecified chronic kidney disease: Secondary | ICD-10-CM | POA: Diagnosis not present

## 2017-01-27 DIAGNOSIS — H52223 Regular astigmatism, bilateral: Secondary | ICD-10-CM | POA: Diagnosis not present

## 2017-01-27 DIAGNOSIS — H524 Presbyopia: Secondary | ICD-10-CM | POA: Diagnosis not present

## 2017-01-27 DIAGNOSIS — D6481 Anemia due to antineoplastic chemotherapy: Secondary | ICD-10-CM | POA: Diagnosis not present

## 2017-01-27 DIAGNOSIS — H5201 Hypermetropia, right eye: Secondary | ICD-10-CM | POA: Diagnosis not present

## 2017-01-27 LAB — HM DIABETES EYE EXAM

## 2017-02-04 ENCOUNTER — Encounter: Payer: Self-pay | Admitting: Adult Health

## 2017-02-04 ENCOUNTER — Ambulatory Visit (INDEPENDENT_AMBULATORY_CARE_PROVIDER_SITE_OTHER): Payer: Medicare Other | Admitting: Adult Health

## 2017-02-04 VITALS — BP 120/60 | HR 88 | Ht 61.0 in | Wt 138.0 lb

## 2017-02-04 DIAGNOSIS — Z1211 Encounter for screening for malignant neoplasm of colon: Secondary | ICD-10-CM

## 2017-02-04 DIAGNOSIS — Z01419 Encounter for gynecological examination (general) (routine) without abnormal findings: Secondary | ICD-10-CM

## 2017-02-04 DIAGNOSIS — Z1212 Encounter for screening for malignant neoplasm of rectum: Secondary | ICD-10-CM | POA: Diagnosis not present

## 2017-02-04 LAB — HEMOCCULT GUIAC POC 1CARD (OFFICE): FECAL OCCULT BLD: NEGATIVE

## 2017-02-04 NOTE — Progress Notes (Signed)
Patient ID: [name], female   DOB: [birth year], 66 y.o.   MRN: [id number] History of Present Illness:  Nazariah is a 66 year old white female, in for well woman gyn exam, she had a normal pap with negative HPV 02-17-16.Her brother died in 19-Oct-2022 and she is still sad. PCP is Dr Luan Pulling and she sees Dr Dorris Fetch.   Current Medications, Allergies, Past Medical History, Past Surgical History, Family History and Social History were reviewed in [employer] record.     Review of Systems:  Patient denies any headaches, hearing loss, fatigue, blurred vision, shortness of breath, chest pain, abdominal pain, problems with bowel movements(constipated, chronically) , urination, or intercourse(not having sex).No joint pain or mood swings.   Physical Exam:BP 120/60 (BP Location: Left Arm, Patient Position: Sitting, Cuff Size: Small)   Pulse 88   Ht 5\' 1"  (1.549 m)   Wt 138 lb (62.6 kg)   BMI 26.07 kg/m  General:  Well developed, well nourished, no acute distress Skin:  Warm and dry Neck:  Midline trachea, normal thyroid, good ROM, no lymphadenopathy,no carotid bruits  Lungs; Clear to auscultation bilaterally Breast:  No dominant palpable mass, retraction, or nipple discharge,pacemaker on left above breast  Cardiovascular: Regular rate and rhythm Abdomen:  Soft, non tender, no hepatosplenomegaly Pelvic:  External genitalia is normal in appearance, no lesions.  The vagina is normal in appearance. Urethra has no lesions or masses. The cervix is smooth.  Uterus is felt to be normal size, shape, and contour.  No adnexal masses or tenderness noted.Bladder is non tender, no masses felt. Rectal: Good sphincter tone, no polyps, or hemorrhoids felt.  Hemoccult negative.+rectocele Extremities/musculoskeletal:  No swelling or varicosities noted, no clubbing or cyanosis Psych:  No mood changes, alert and cooperative,seems happy PHQ 9 score 7,is on meds and has therapist.denies being suicidal.    Impression:  1. Well woman exam with routine gynecological exam   2. Screening for colorectal cancer      Plan: Physical in 1 year Pap in 2020 Mammogram yearly Labs with PCP and Dr Dorris Fetch  Colonoscopy per GI

## 2017-02-09 ENCOUNTER — Ambulatory Visit (HOSPITAL_COMMUNITY)
Admission: RE | Admit: 2017-02-09 | Discharge: 2017-02-09 | Disposition: A | Discharge status: Home / Self Care | Payer: Medicare Other | Source: Ambulatory Visit | Attending: Pulmonary Disease | Admitting: Pulmonary Disease

## 2017-02-09 DIAGNOSIS — R9341 Abnormal radiologic findings on diagnostic imaging of renal pelvis, ureter, or bladder: Secondary | ICD-10-CM | POA: Diagnosis not present

## 2017-02-09 DIAGNOSIS — N1330 Unspecified hydronephrosis: Secondary | ICD-10-CM | POA: Diagnosis not present

## 2017-02-09 DIAGNOSIS — N185 Chronic kidney disease, stage 5: Secondary | ICD-10-CM | POA: Insufficient documentation

## 2017-02-09 DIAGNOSIS — N181 Chronic kidney disease, stage 1: Secondary | ICD-10-CM | POA: Diagnosis not present

## 2017-02-10 DIAGNOSIS — E1121 Type 2 diabetes mellitus with diabetic nephropathy: Secondary | ICD-10-CM | POA: Diagnosis not present

## 2017-02-10 DIAGNOSIS — M545 Low back pain: Secondary | ICD-10-CM | POA: Diagnosis not present

## 2017-02-10 DIAGNOSIS — N183 Chronic kidney disease, stage 3 (moderate): Secondary | ICD-10-CM | POA: Diagnosis not present

## 2017-02-10 DIAGNOSIS — I129 Hypertensive chronic kidney disease with stage 1 through stage 4 chronic kidney disease, or unspecified chronic kidney disease: Secondary | ICD-10-CM | POA: Diagnosis not present

## 2017-02-22 ENCOUNTER — Ambulatory Visit (INDEPENDENT_AMBULATORY_CARE_PROVIDER_SITE_OTHER): Payer: Medicare Other | Admitting: *Deleted

## 2017-02-22 DIAGNOSIS — I255 Ischemic cardiomyopathy: Secondary | ICD-10-CM | POA: Diagnosis not present

## 2017-02-22 NOTE — Progress Notes (Signed)
Remote ICD transmission.   

## 2017-02-23 LAB — CUP PACEART REMOTE DEVICE CHECK
Brady Statistic AP VS Percent: 0.02 %
Brady Statistic AS VP Percent: 97.95 %
Brady Statistic RA Percent Paced: 0.05 %
Brady Statistic RV Percent Paced: 24.03 %
HighPow Impedance: 79 Ohm
Implantable Lead Implant Date: 20060214
Implantable Lead Implant Date: 20150406
Implantable Lead Location: 753859
Implantable Lead Location: 753860
Implantable Lead Model: 4396
Lead Channel Impedance Value: 399 Ohm
Lead Channel Impedance Value: 456 Ohm
Lead Channel Impedance Value: 551 Ohm
Lead Channel Impedance Value: 646 Ohm
Lead Channel Pacing Threshold Amplitude: 0.625 V
Lead Channel Pacing Threshold Amplitude: 0.875 V
Lead Channel Pacing Threshold Pulse Width: 0.4 ms
Lead Channel Pacing Threshold Pulse Width: 0.4 ms
Lead Channel Sensing Intrinsic Amplitude: 3.125 mV
Lead Channel Setting Pacing Amplitude: 2 V
Lead Channel Setting Pacing Pulse Width: 0.6 ms
Lead Channel Setting Sensing Sensitivity: 0.3 mV
MDC IDC LEAD IMPLANT DT: 20150406
MDC IDC LEAD LOCATION: 753858
MDC IDC MSMT BATTERY REMAINING LONGEVITY: 34 mo
MDC IDC MSMT BATTERY VOLTAGE: 2.95 V
MDC IDC MSMT LEADCHNL LV IMPEDANCE VALUE: 817 Ohm
MDC IDC MSMT LEADCHNL LV PACING THRESHOLD AMPLITUDE: 1.5 V
MDC IDC MSMT LEADCHNL LV PACING THRESHOLD PULSEWIDTH: 0.6 ms
MDC IDC MSMT LEADCHNL RA SENSING INTR AMPL: 3.125 mV
MDC IDC MSMT LEADCHNL RV IMPEDANCE VALUE: 399 Ohm
MDC IDC MSMT LEADCHNL RV SENSING INTR AMPL: 17.625 mV
MDC IDC MSMT LEADCHNL RV SENSING INTR AMPL: 17.625 mV
MDC IDC PG IMPLANT DT: 20150406
MDC IDC SESS DTM: 20180611041706
MDC IDC SET LEADCHNL LV PACING AMPLITUDE: 2.75 V
MDC IDC SET LEADCHNL RV PACING AMPLITUDE: 2.5 V
MDC IDC SET LEADCHNL RV PACING PULSEWIDTH: 0.4 ms
MDC IDC STAT BRADY AP VP PERCENT: 0.03 %
MDC IDC STAT BRADY AS VS PERCENT: 2.01 %

## 2017-02-24 ENCOUNTER — Encounter: Payer: Self-pay | Admitting: Cardiology

## 2017-02-25 DIAGNOSIS — R339 Retention of urine, unspecified: Secondary | ICD-10-CM | POA: Diagnosis not present

## 2017-03-16 ENCOUNTER — Encounter (HOSPITAL_COMMUNITY): Payer: Medicare Other | Attending: Adult Health | Admitting: Adult Health

## 2017-03-16 ENCOUNTER — Encounter (HOSPITAL_COMMUNITY): Payer: Medicare Other

## 2017-03-16 VITALS — BP 138/111 | HR 94 | Resp 16 | Ht 61.0 in | Wt 139.0 lb

## 2017-03-16 DIAGNOSIS — Z9049 Acquired absence of other specified parts of digestive tract: Secondary | ICD-10-CM | POA: Insufficient documentation

## 2017-03-16 DIAGNOSIS — D631 Anemia in chronic kidney disease: Secondary | ICD-10-CM | POA: Diagnosis not present

## 2017-03-16 DIAGNOSIS — K219 Gastro-esophageal reflux disease without esophagitis: Secondary | ICD-10-CM | POA: Diagnosis not present

## 2017-03-16 DIAGNOSIS — N189 Chronic kidney disease, unspecified: Secondary | ICD-10-CM

## 2017-03-16 DIAGNOSIS — K5909 Other constipation: Secondary | ICD-10-CM | POA: Diagnosis not present

## 2017-03-16 DIAGNOSIS — Z79899 Other long term (current) drug therapy: Secondary | ICD-10-CM | POA: Diagnosis not present

## 2017-03-16 DIAGNOSIS — I509 Heart failure, unspecified: Secondary | ICD-10-CM | POA: Diagnosis not present

## 2017-03-16 DIAGNOSIS — Z7982 Long term (current) use of aspirin: Secondary | ICD-10-CM | POA: Insufficient documentation

## 2017-03-16 DIAGNOSIS — D649 Anemia, unspecified: Secondary | ICD-10-CM | POA: Diagnosis not present

## 2017-03-16 DIAGNOSIS — Z950 Presence of cardiac pacemaker: Secondary | ICD-10-CM | POA: Insufficient documentation

## 2017-03-16 DIAGNOSIS — E7800 Pure hypercholesterolemia, unspecified: Secondary | ICD-10-CM | POA: Insufficient documentation

## 2017-03-16 DIAGNOSIS — Z803 Family history of malignant neoplasm of breast: Secondary | ICD-10-CM | POA: Diagnosis not present

## 2017-03-16 DIAGNOSIS — K5900 Constipation, unspecified: Secondary | ICD-10-CM

## 2017-03-16 DIAGNOSIS — E1122 Type 2 diabetes mellitus with diabetic chronic kidney disease: Secondary | ICD-10-CM | POA: Insufficient documentation

## 2017-03-16 DIAGNOSIS — I130 Hypertensive heart and chronic kidney disease with heart failure and stage 1 through stage 4 chronic kidney disease, or unspecified chronic kidney disease: Secondary | ICD-10-CM | POA: Diagnosis not present

## 2017-03-16 DIAGNOSIS — D509 Iron deficiency anemia, unspecified: Secondary | ICD-10-CM

## 2017-03-16 DIAGNOSIS — Z9889 Other specified postprocedural states: Secondary | ICD-10-CM | POA: Diagnosis not present

## 2017-03-16 DIAGNOSIS — Z800 Family history of malignant neoplasm of digestive organs: Secondary | ICD-10-CM | POA: Insufficient documentation

## 2017-03-16 DIAGNOSIS — Z833 Family history of diabetes mellitus: Secondary | ICD-10-CM | POA: Insufficient documentation

## 2017-03-16 DIAGNOSIS — Z808 Family history of malignant neoplasm of other organs or systems: Secondary | ICD-10-CM | POA: Diagnosis not present

## 2017-03-16 DIAGNOSIS — Z794 Long term (current) use of insulin: Secondary | ICD-10-CM | POA: Insufficient documentation

## 2017-03-16 DIAGNOSIS — F329 Major depressive disorder, single episode, unspecified: Secondary | ICD-10-CM

## 2017-03-16 DIAGNOSIS — I429 Cardiomyopathy, unspecified: Secondary | ICD-10-CM | POA: Diagnosis not present

## 2017-03-16 DIAGNOSIS — F419 Anxiety disorder, unspecified: Secondary | ICD-10-CM | POA: Diagnosis not present

## 2017-03-16 LAB — CBC WITH DIFFERENTIAL/PLATELET
BASOS ABS: 0 10*3/uL (ref 0.0–0.1)
Basophils Relative: 1 %
Eosinophils Absolute: 0.3 10*3/uL (ref 0.0–0.7)
Eosinophils Relative: 3 %
HEMATOCRIT: 38.3 % (ref 36.0–46.0)
Hemoglobin: 12.5 g/dL (ref 12.0–15.0)
LYMPHS ABS: 1.7 10*3/uL (ref 0.7–4.0)
LYMPHS PCT: 23 %
MCH: 29.8 pg (ref 26.0–34.0)
MCHC: 32.6 g/dL (ref 30.0–36.0)
MCV: 91.4 fL (ref 78.0–100.0)
MONO ABS: 0.8 10*3/uL (ref 0.1–1.0)
Monocytes Relative: 11 %
NEUTROS ABS: 4.7 10*3/uL (ref 1.7–7.7)
Neutrophils Relative %: 62 %
PLATELETS: 286 10*3/uL (ref 150–400)
RBC: 4.19 MIL/uL (ref 3.87–5.11)
RDW: 13.3 % (ref 11.5–15.5)
WBC: 7.6 10*3/uL (ref 4.0–10.5)

## 2017-03-16 LAB — COMPREHENSIVE METABOLIC PANEL
ALT: 27 U/L (ref 14–54)
AST: 25 U/L (ref 15–41)
Albumin: 4.2 g/dL (ref 3.5–5.0)
Alkaline Phosphatase: 95 U/L (ref 38–126)
Anion gap: 12 (ref 5–15)
BILIRUBIN TOTAL: 0.6 mg/dL (ref 0.3–1.2)
BUN: 29 mg/dL — AB (ref 6–20)
CO2: 24 mmol/L (ref 22–32)
CREATININE: 1.53 mg/dL — AB (ref 0.44–1.00)
Calcium: 9.6 mg/dL (ref 8.9–10.3)
Chloride: 101 mmol/L (ref 101–111)
GFR, EST AFRICAN AMERICAN: 40 mL/min — AB (ref >60)
GFR, EST NON AFRICAN AMERICAN: 35 mL/min — AB (ref >60)
Glucose, Bld: 96 mg/dL (ref 65–99)
POTASSIUM: 4.1 mmol/L (ref 3.5–5.1)
Sodium: 137 mmol/L (ref 135–145)
TOTAL PROTEIN: 8.2 g/dL — AB (ref 6.5–8.1)

## 2017-03-16 LAB — FERRITIN: FERRITIN: 31 ng/mL (ref 11–307)

## 2017-03-16 LAB — IRON AND TIBC
Iron: 113 ug/dL (ref 28–170)
Saturation Ratios: 28 % (ref 10.4–31.8)
TIBC: 399 ug/dL (ref 250–450)
UIBC: 286 ug/dL

## 2017-03-16 MED ORDER — POLYSACCHAR IRON-FA-B12 150-1-25 MG-MG-MCG PO CAPS: 1.0000 | ORAL_CAPSULE | Freq: Every day | ORAL | 6 refills | Status: DC

## 2017-03-16 NOTE — Patient Instructions (Signed)
[place] at [facility] Discharge Instructions  RECOMMENDATIONS MADE BY THE CONSULTANT AND ANY TEST RESULTS WILL BE SENT TO YOUR REFERRING PHYSICIAN.  You were seen today by Mike Craze NP. Constipation sheet given. Start taking Ferrous sulfate daily. Please call us if you feel like you need labs prior to next appointment. Return in 4 months for labs and follow up.   Thank you for choosing [place] at [facility] to provide your oncology and hematology care.  To afford each patient quality time with our provider, please arrive at least 15 minutes before your scheduled appointment time.    If you have a lab appointment with the [facility] please come in thru the  Main Entrance and check in at the main information desk  You need to re-schedule your appointment should you arrive 10 or more minutes late.  We strive to give you quality time with our providers, and arriving late affects you and other patients whose appointments are after yours.  Also, if you no show three or more times for appointments you may be dismissed from the clinic at the providers discretion.     Again, thank you for choosing [facility].  Our hope is that these requests will decrease the amount of time that you wait before being seen by our physicians.       _____________________________________________________________  Should you have questions after your visit to [facility], please contact our office at [phone] between the hours of 8:30 a.m. and 4:30 p.m.  Voicemails left after 4:30 p.m. will not be returned until the following business day.  For prescription refill requests, have your pharmacy contact our office.       Resources For Cancer Patients and their Caregivers ? American Cancer Society: Can assist with transportation, wigs, general needs, runs Look Good Feel Better.        469-828-8544 ? Cancer Care: Provides  financial assistance, online support groups, medication/co-pay assistance.  1-800-813-HOPE 773-851-6136) ? [place] Assists [place] cancer patients and their families through emotional , educational and financial support.  215-226-0661 ? [place] DSS Where to apply for food stamps, Medicaid and utility assistance. 620-578-4237 ? [facility]: Transportation to medical appointments. (850)741-6618 ? Social Security Administration: May apply for disability if have a Stage IV cancer. (574)245-4643 (984) 821-6182 ? [employer], Disability and Transit Services: Assists with nutrition, care and transit needs. [place] Support Programs: @10RELATIVEDAYS @ > Cancer Support Group  2nd Tuesday of the month 1pm-2pm, Journey Room  > Creative Journey  3rd Tuesday of the month 1130am-1pm, Journey Room  > Look Good Feel Better  1st Wednesday of the month 10am-12 noon, Journey Room (Call [place] to register [phone])
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
## 2017-03-17 LAB — ERYTHROPOIETIN: ERYTHROPOIETIN: 15.4 m[IU]/mL (ref 2.6–18.5)

## 2017-03-18 ENCOUNTER — Other Ambulatory Visit: Payer: Self-pay | Admitting: "Endocrinology

## 2017-03-18 DIAGNOSIS — E1065 Type 1 diabetes mellitus with hyperglycemia: Secondary | ICD-10-CM

## 2017-03-18 DIAGNOSIS — IMO0002 Reserved for concepts with insufficient information to code with codable children: Secondary | ICD-10-CM

## 2017-03-18 DIAGNOSIS — E108 Type 1 diabetes mellitus with unspecified complications: Principal | ICD-10-CM

## 2017-03-18 LAB — KAPPA/LAMBDA LIGHT CHAINS
KAPPA FREE LGHT CHN: 39.3 mg/L — AB (ref 3.3–19.4)
Kappa, lambda light chain ratio: 1.86 — ABNORMAL HIGH (ref 0.26–1.65)
Lambda free light chains: 21.1 mg/L (ref 5.7–26.3)

## 2017-03-22 ENCOUNTER — Ambulatory Visit (INDEPENDENT_AMBULATORY_CARE_PROVIDER_SITE_OTHER): Payer: Medicare Other | Admitting: Otolaryngology

## 2017-03-22 ENCOUNTER — Ambulatory Visit: Payer: Medicare Other | Admitting: "Endocrinology

## 2017-03-23 ENCOUNTER — Emergency Department (HOSPITAL_COMMUNITY)
Admission: EM | Admit: 2017-03-23 | Discharge: 2017-03-23 | Disposition: A | Discharge status: Home / Self Care | Payer: Medicare Other | Attending: Emergency Medicine | Admitting: Emergency Medicine

## 2017-03-23 ENCOUNTER — Encounter (HOSPITAL_COMMUNITY): Payer: Self-pay | Admitting: *Deleted

## 2017-03-23 DIAGNOSIS — Z950 Presence of cardiac pacemaker: Secondary | ICD-10-CM | POA: Insufficient documentation

## 2017-03-23 DIAGNOSIS — Z79899 Other long term (current) drug therapy: Secondary | ICD-10-CM | POA: Diagnosis not present

## 2017-03-23 DIAGNOSIS — T383X1A Poisoning by insulin and oral hypoglycemic [antidiabetic] drugs, accidental (unintentional), initial encounter: Secondary | ICD-10-CM | POA: Diagnosis not present

## 2017-03-23 DIAGNOSIS — I5042 Chronic combined systolic (congestive) and diastolic (congestive) heart failure: Secondary | ICD-10-CM | POA: Diagnosis not present

## 2017-03-23 DIAGNOSIS — E119 Type 2 diabetes mellitus without complications: Secondary | ICD-10-CM | POA: Diagnosis not present

## 2017-03-23 DIAGNOSIS — I110 Hypertensive heart disease with heart failure: Secondary | ICD-10-CM | POA: Insufficient documentation

## 2017-03-23 DIAGNOSIS — Z7982 Long term (current) use of aspirin: Secondary | ICD-10-CM | POA: Diagnosis not present

## 2017-03-23 DIAGNOSIS — R7309 Other abnormal glucose: Secondary | ICD-10-CM | POA: Diagnosis present

## 2017-03-23 LAB — CBG MONITORING, ED
GLUCOSE-CAPILLARY: 84 mg/dL (ref 65–99)
Glucose-Capillary: 202 mg/dL — ABNORMAL HIGH (ref 65–99)
Glucose-Capillary: 163 mg/dL — ABNORMAL HIGH (ref 65–99)
Glucose-Capillary: 182 mg/dL — ABNORMAL HIGH (ref 65–99)

## 2017-03-23 LAB — BASIC METABOLIC PANEL
Anion gap: 11 (ref 5–15)
BUN: 39 mg/dL — AB (ref 6–20)
CHLORIDE: 103 mmol/L (ref 101–111)
CO2: 25 mmol/L (ref 22–32)
CREATININE: 1.74 mg/dL — AB (ref 0.44–1.00)
Calcium: 9.7 mg/dL (ref 8.9–10.3)
GFR calc Af Amer: 35 mL/min — ABNORMAL LOW (ref >60)
GFR calc non Af Amer: 30 mL/min — ABNORMAL LOW (ref >60)
GLUCOSE: 93 mg/dL (ref 65–99)
Potassium: 4.2 mmol/L (ref 3.5–5.1)
Sodium: 139 mmol/L (ref 135–145)

## 2017-03-23 MED ORDER — DEXTROSE 50 % IV SOLN
INTRAVENOUS | Status: AC
  Filled 2017-03-23: qty 50

## 2017-03-23 MED ORDER — DEXTROSE 50 % IV SOLN
1.0000 | Freq: Once | INTRAVENOUS | Status: AC
  Administered 2017-03-23: 50 mL via INTRAVENOUS

## 2017-03-23 NOTE — Discharge Instructions (Signed)
You were seen today for an accidental overdose of insulin. Continue to monitor your blood sugars closely and do not administer any further insulin until after a normal breakfast and repeat blood sugar testing. If you begin to feel symptoms of low blood sugar, take glucose tablets and seek evaluation.

## 2017-03-23 NOTE — ED Provider Notes (Signed)
[facility] DEPT Provider Note   CSN: [id number] Arrival date & time: 03/23/17  0009     History   Chief Complaint Chief Complaint  Patient presents with  . Drug Overdose    HPI [name] is a 66 y.o. female.  HPI  This is a 66 year old female with a history of diabetes, cardiomyopathy, hypertension, hyperlipidemia who presents after administering 2 months insulin. Patient reports that at 10 PM she administered 40 units of Humalog. She states that at night she usually takes 40 units of Levemir. At that time her blood glucose was 197. Since that time she has taken 6 glucose tablets, eat a piece of pie, and a pack of crackers. She has noted downward trend of her blood sugars. Currently her blood sugar is 84. She reports that she feels shaky and is having difficulty concentrating. She also reports sweatiness and headache. No chest pain or shortness of breath. She states that she feels this way when her blood glucose is falling.  Past Medical History:  Diagnosis Date  . Anxiety   . Cardiomyopathy   . Cervical high risk HPV (human papillomavirus) test positive 01/30/2015  . Chronic constipation   . Chronic lower back pain   . Constipation 01/30/2015  . Depression   . GERD (gastroesophageal reflux disease)   . Heart failure    Chronic combined  . HTN (hypertension)   . Hypercholesteremia   . Neurogenic bladder    2008 nerve damage s/p back ssurgery  . Type II diabetes mellitus [facility])     Patient Active Problem List   Diagnosis Date Noted  . Anemia in chronic kidney disease 12/15/2016  . Essential hypertension, benign 09/12/2015  . Constipation 01/30/2015  . Cervical high risk HPV (human papillomavirus) test positive 01/30/2015  . Biventricular ICD (implantable cardioverter-defibrillator) in place 04/19/2014  . Chronic systolic heart failure ([place]) 12/18/2013  . Preop cardiovascular exam 11/17/2011  . Colon cancer screening 02/04/2011  . Type 2 diabetes mellitus,  uncontrolled ([place]) 12/31/2009  . Mixed hyperlipidemia 12/31/2009  . ANXIETY DEPRESSION 12/31/2009  . GASTROESOPHAGEAL REFLUX DISEASE, CHRONIC 12/31/2009  . CONSTIPATION, CHRONIC 12/31/2009  . BACK PAIN, CHRONIC 12/31/2009  . CARDIOMYOPATHY 05/22/2009  . HYPERTENSION, HEART CONTROLLED W/O ASSOC CHF 11/27/2008  . COMBINED HEART FAILURE, CHRONIC 11/27/2008  . PACEMAKER, PERMANENT 11/27/2008    Past Surgical History:  Procedure Laterality Date  . ACHILLES TENDON SURGERY Right 06/2012  . BACK SURGERY  2005  . BI-VENTRICULAR IMPLANTABLE CARDIOVERTER DEFIBRILLATOR  (CRT-D)  12-18-2013   upgrade of previously implanted dual chamber pacemaker to MDT CRTD with RV lead revision by Dr Lovena Le  . BI-VENTRICULAR PACEMAKER UPGRADE  12/18/2013   w/defibrillator  . BIV PACEMAKER GENERATOR CHANGE OUT N/A 12/18/2013   Procedure: BIV PACEMAKER GENERATOR CHANGE OUT;  Surgeon: Evans Lance, MD;  Location: [facility] CATH LAB;  Service: Cardiovascular;  Laterality: N/A;  . BREAST BIOPSY Left 1973   benign  . BREAST LUMPECTOMY Left 1973  . CESAREAN SECTION  1981  . CHOLECYSTECTOMY    . GASTROCNEMIUS RECESSION  06/24/2012   Procedure: GASTROCNEMIUS SLIDE;  Surgeon: Colin Rhein, MD;  Location: [facility] ORS;  Service: Orthopedics;  Laterality: Right;  . HERNIA REPAIR    . LEAD REVISION N/A 12/18/2013   Procedure: LEAD REVISION;  Surgeon: Evans Lance, MD;  Location: [facility] CATH LAB;  Service: Cardiovascular;  Laterality: N/A;  . PACEMAKER INSERTION  2006   Dual chamber MDT pacemaker implanted - subsequent upgrade to CRTD 12-2013  .  POSTERIOR LUMBAR FUSION  2006; 2008  . SIGMOIDOSCOPY  MAY 2011 w/ PROP   iTCS-->FSIG-poor bowel prep  . TUBAL LIGATION  ~ 1984  . UPPER GASTROINTESTINAL ENDOSCOPY  MAY 2011 w/ PROP   MILD GASTRITIS 2o ETOH    OB History    Gravida Para Term Preterm AB Living   1 1           SAB TAB Ectopic Multiple Live Births           1       Home Medications    Prior to Admission medications     Medication Sig Start Date End Date Taking? Authorizing Provider  acyclovir (ZOVIRAX) 200 MG capsule  09/23/16  Yes [provider]  ALPRAZolam (XANAX) 0.5 MG tablet Take 1 tablet by mouth 4 (four) times daily as needed for anxiety. 07/13/16  Yes [provider]  Artificial Tear Solution (SYSTANE CONTACTS OP) Place 1 drop into both eyes 2 (two) times daily.   Yes [provider]  aspirin EC 81 MG tablet Take 81 mg by mouth daily.   Yes [provider]  atorvastatin (LIPITOR) 40 MG tablet Take 40 mg by mouth every morning.    Yes [provider]  blood glucose meter kit and supplies KIT Dispense based on patient and insurance preference. Use up to four times daily as directed. (FOR ICD-10 E10.65) 12/03/16  Yes Cassandria Anger, MD  buPROPion [facility] SR) 150 MG 12 hr tablet  03/03/17  Yes [provider]  carvedilol (COREG) 6.25 MG tablet TAKE ONE TABLET BY MOUTH TWICE A DAY WITH A MEAL 12/03/16  Yes Evans Lance, MD  digoxin (LANOXIN) 0.125 MG tablet Take 1 tablet (125 mcg total) by mouth daily. 06/14/12  Yes Evans Lance, MD  furosemide (LASIX) 40 MG tablet Take 120 mg by mouth daily.   Yes [provider]  HYDROcodone-acetaminophen (NORCO) 10-325 MG tablet Take 1-2 tablets by mouth every 6 (six) hours as needed for moderate pain.   Yes [provider]  Insulin Detemir (LEVEMIR FLEXTOUCH) 100 UNIT/ML Pen Inject 40 Units into the skin at bedtime. 01/04/17  Yes Nida, Marella Chimes, MD  insulin lispro (HUMALOG KWIKPEN) 100 UNIT/ML KiwkPen Inject 0.08-0.14 mLs (8-14 Units total) into the skin 3 (three) times daily. 01/04/17  Yes Nida, Marella Chimes, MD  losartan (COZAAR) 100 MG tablet Take 100 mg by mouth every morning.  05/22/13  Yes [provider]  omeprazole (PRILOSEC) 20 MG capsule Take 20 mg by mouth 2 (two) times daily.    Yes [provider]  ONE TOUCH ULTRA TEST test strip USE TO CHECK BLOOD  SUGAR UP TO 4 TIMES ADAY. 01/04/17  Yes Nida, Marella Chimes, MD  ONETOUCH DELICA LANCETS 42H MISC USE TO CHECK BLOOD SUGAR UP TO FOUR TIMES DAILY. 01/04/17  Yes Nida, Marella Chimes, MD  Polysacchar Iron-FA-B12 (FERREX 150 FORTE) 150-1-25 MG-MG-MCG CAPS Take 1 tablet by mouth daily. 03/16/17  Yes Holley Bouche, NP  potassium chloride SA (K-DUR,KLOR-CON) 20 MEQ tablet Take 20 mEq by mouth 2 (two) times daily.    Yes [provider]  UNIFINE PENTIPS 32G X 4 MM MISC USE ONCE AT BEDTIME 07/31/15  Yes Nida, Marella Chimes, MD  venlafaxine (EFFEXOR-XR) 150 MG 24 hr capsule Take 300 mg by mouth every morning.    Yes [provider]  vitamin C (ASCORBIC ACID) 250 MG tablet Take 250 mg by mouth daily.   Yes [provider]  zolpidem (AMBIEN) 10 MG tablet Take 20 mg by mouth at bedtime.    Yes [provider]    Family History Family History  Problem Relation Age of Onset  . Alzheimer's disease Mother   . Diabetes Mother   . Cancer Father   . Cancer Sister        breast  . Diabetes Sister   . Diabetes Brother   . Brain cancer Brother   . Diabetes Brother   . Colon cancer Neg Hx   . Colon polyps Neg Hx     Social History Social History  Substance Use Topics  . Smoking status: Never Smoker  . Smokeless tobacco: Never Used  . Alcohol use 0.0 oz/week     Comment: drinks beer or wine three times a week     Allergies   Patient has no known allergies.   Review of Systems Review of Systems  Constitutional: Positive for chills and diaphoresis. Negative for fever.  Respiratory: Negative for shortness of breath.   Cardiovascular: Negative for chest pain.  Gastrointestinal: Negative for nausea.  Genitourinary: Negative for dysuria.  Neurological: Positive for headaches.  All other systems reviewed and are negative.    Physical Exam Updated Vital Signs BP 138/60   Pulse 81   Temp 98.6 F (37 C) (Oral)   Resp 16   Ht '5\' 1"'  (1.549 m)   Wt 63  kg (139 lb)   SpO2 99%   BMI 26.26 kg/m   Physical Exam  Constitutional: She is oriented to person, place, and time. She appears well-developed and well-nourished.  HENT:  Head: Normocephalic and atraumatic.  Eyes: Pupils are equal, round, and reactive to light.  Cardiovascular: Normal rate, regular rhythm and normal heart sounds.   Pulmonary/Chest: Effort normal and breath sounds normal. No respiratory distress. She has no wheezes.  Abdominal: Soft. There is no tenderness.  Neurological: She is alert and oriented to person, place, and time.  Skin: Skin is warm. She is diaphoretic.  Psychiatric: She has a normal mood and affect.  Nursing note and vitals reviewed.    ED Treatments / Results  Labs (all labs ordered are listed, but only abnormal results are displayed) Labs Reviewed  BASIC METABOLIC PANEL - Abnormal; Notable for the following:       Result Value   BUN 39 (*)    Creatinine, Ser 1.74 (*)    GFR calc non Af Amer 30 (*)    GFR calc Af Amer 35 (*)    All other components within normal limits  CBG MONITORING, ED - Abnormal; Notable for the following:    Glucose-Capillary 202 (*)    All other components within normal limits  CBG MONITORING, ED - Abnormal; Notable for the following:    Glucose-Capillary 163 (*)    All other components within normal limits  CBG MONITORING, ED - Abnormal; Notable for the following:    Glucose-Capillary 182 (*)    All other components within normal limits  CBG MONITORING, ED    EKG  EKG Interpretation None       Radiology No results found.  Procedures Procedures (including critical care time)  Medications Ordered in ED Medications  dextrose 50 % solution 50 mL (50 mLs Intravenous Given 03/23/17 0047)     Initial Impression / Assessment and Plan / ED Course  I have reviewed the triage vital signs and the nursing notes.  Pertinent labs & imaging results that were available during my care  of the patient were reviewed by  me and considered in my medical decision making (see chart for details).     Patient presents after accidentally administering 40 units of Humalog instead of her long-acting insulin. She is overall nontoxic appearing. She is diaphoretic. Blood sugar initially 84. Given that she is symptomatic and has had continued decline in blood sugar despite multiple oral challenges, patient was given 1 amp of D50. Peak of Humalog is between 2 and 5 hours. She is right timing. She was monitored closely. She continued to eat. Multiple repeat blood sugars at 1, 2, and 3 hours were reassuring. Patient states that she feels "great." Feel that she is mechanically monitored. She has not required any further glucose administration. Encouraged close glucose monitoring at home and no administration of insulin until after breakfast if needed. Patient stated understanding.  After history, exam, and medical workup I feel the patient has been appropriately medically screened and is safe for discharge home. Pertinent diagnoses were discussed with the patient. Patient was given return precautions.   Final Clinical Impressions(s) / ED Diagnoses   Final diagnoses:  Insulin overdose, accidental or unintentional, initial encounter    New Prescriptions New Prescriptions   No medications on file     Merryl Hacker, MD 03/23/17 [phone]

## 2017-03-23 NOTE — ED Notes (Signed)
Patient given regular Coke and peanut butter nabs at this time. RN aware.

## 2017-03-23 NOTE — ED Triage Notes (Signed)
Pt states she took 40 units of humalog insulin instead of her levimer tonight around 2200.

## 2017-03-29 DIAGNOSIS — E1121 Type 2 diabetes mellitus with diabetic nephropathy: Secondary | ICD-10-CM | POA: Diagnosis not present

## 2017-03-29 DIAGNOSIS — M545 Low back pain: Secondary | ICD-10-CM | POA: Diagnosis not present

## 2017-03-29 DIAGNOSIS — E1165 Type 2 diabetes mellitus with hyperglycemia: Secondary | ICD-10-CM | POA: Diagnosis not present

## 2017-03-29 DIAGNOSIS — I10 Essential (primary) hypertension: Secondary | ICD-10-CM | POA: Diagnosis not present

## 2017-03-31 DIAGNOSIS — R809 Proteinuria, unspecified: Secondary | ICD-10-CM | POA: Diagnosis not present

## 2017-03-31 DIAGNOSIS — I509 Heart failure, unspecified: Secondary | ICD-10-CM | POA: Diagnosis not present

## 2017-03-31 DIAGNOSIS — D649 Anemia, unspecified: Secondary | ICD-10-CM | POA: Diagnosis not present

## 2017-03-31 DIAGNOSIS — N183 Chronic kidney disease, stage 3 (moderate): Secondary | ICD-10-CM | POA: Diagnosis not present

## 2017-04-05 ENCOUNTER — Telehealth: Payer: Self-pay | Admitting: "Endocrinology

## 2017-04-05 DIAGNOSIS — E108 Type 1 diabetes mellitus with unspecified complications: Secondary | ICD-10-CM | POA: Diagnosis not present

## 2017-04-05 DIAGNOSIS — E119 Type 2 diabetes mellitus without complications: Secondary | ICD-10-CM | POA: Diagnosis not present

## 2017-04-05 DIAGNOSIS — E1065 Type 1 diabetes mellitus with hyperglycemia: Secondary | ICD-10-CM | POA: Diagnosis not present

## 2017-04-05 DIAGNOSIS — R21 Rash and other nonspecific skin eruption: Secondary | ICD-10-CM | POA: Diagnosis not present

## 2017-04-05 NOTE — Telephone Encounter (Signed)
Pt states she has had high BG readings after getting a Dexamethasone inj for Poison oak yesterday.    Date Before breakfast Before lunch Before supper Bedtime  7/21 184 124 157 160  7/22 277 185 216 160  7/23 400 280            Pt taking: Levemir 40 units qhs, Humalog 8-14 units TIDAC

## 2017-04-05 NOTE — Telephone Encounter (Signed)
Pt.notified

## 2017-04-05 NOTE — Telephone Encounter (Signed)
[name] is stating that she had to get a Prednisone Injection for Poison [place] sugar is running 400 this morning fasting what should she do?

## 2017-04-05 NOTE — Telephone Encounter (Signed)
Advise to increase Levemir to 50 units qhs, humalog to 10 units TIDAC plus SSI.

## 2017-04-06 LAB — COMPREHENSIVE METABOLIC PANEL
A/G RATIO: 1.8 (ref 1.2–2.2)
ALBUMIN: 4.9 g/dL — AB (ref 3.6–4.8)
ALT: 20 IU/L (ref 0–32)
AST: 16 IU/L (ref 0–40)
Alkaline Phosphatase: 98 IU/L (ref 39–117)
BUN/Creatinine Ratio: 25 (ref 12–28)
BUN: 39 mg/dL — ABNORMAL HIGH (ref 8–27)
Bilirubin Total: 0.3 mg/dL (ref 0.0–1.2)
CALCIUM: 10.1 mg/dL (ref 8.7–10.3)
CO2: 20 mmol/L (ref 20–29)
CREATININE: 1.56 mg/dL — AB (ref 0.57–1.00)
Chloride: 98 mmol/L (ref 96–106)
GFR, EST AFRICAN AMERICAN: 40 mL/min/{1.73_m2} — AB (ref >59)
GFR, EST NON AFRICAN AMERICAN: 35 mL/min/{1.73_m2} — AB (ref >59)
GLOBULIN, TOTAL: 2.8 g/dL (ref 1.5–4.5)
Glucose: 263 mg/dL — ABNORMAL HIGH (ref 65–99)
POTASSIUM: 5.2 mmol/L (ref 3.5–5.2)
SODIUM: 140 mmol/L (ref 134–144)
TOTAL PROTEIN: 7.7 g/dL (ref 6.0–8.5)

## 2017-04-06 LAB — HEMOGLOBIN A1C
Est. average glucose Bld gHb Est-mCnc: 177 mg/dL
HEMOGLOBIN A1C: 7.8 % — AB (ref 4.8–5.6)

## 2017-04-09 ENCOUNTER — Other Ambulatory Visit (HOSPITAL_COMMUNITY): Payer: Self-pay | Admitting: Nephrology

## 2017-04-09 DIAGNOSIS — N183 Chronic kidney disease, stage 3 unspecified: Secondary | ICD-10-CM

## 2017-04-13 ENCOUNTER — Telehealth: Payer: Self-pay | Admitting: Adult Health

## 2017-04-13 NOTE — Telephone Encounter (Signed)
Left message that I called her back

## 2017-04-14 NOTE — Telephone Encounter (Signed)
Left message I returned her call

## 2017-04-15 ENCOUNTER — Ambulatory Visit: Payer: Medicare Other | Admitting: "Endocrinology

## 2017-04-28 ENCOUNTER — Encounter (HOSPITAL_COMMUNITY): Payer: Self-pay

## 2017-04-28 ENCOUNTER — Ambulatory Visit (HOSPITAL_COMMUNITY)
Admission: RE | Admit: 2017-04-28 | Discharge: 2017-04-28 | Disposition: A | Discharge status: Home / Self Care | Payer: Medicare Other | Source: Ambulatory Visit | Attending: Nephrology | Admitting: Nephrology

## 2017-05-06 ENCOUNTER — Encounter: Payer: Self-pay | Admitting: "Endocrinology

## 2017-05-06 ENCOUNTER — Ambulatory Visit (INDEPENDENT_AMBULATORY_CARE_PROVIDER_SITE_OTHER): Payer: Medicare Other | Admitting: "Endocrinology

## 2017-05-06 VITALS — BP 140/80 | HR 92 | Ht 61.0 in | Wt 142.0 lb

## 2017-05-06 DIAGNOSIS — I10 Essential (primary) hypertension: Secondary | ICD-10-CM

## 2017-05-06 DIAGNOSIS — E1122 Type 2 diabetes mellitus with diabetic chronic kidney disease: Secondary | ICD-10-CM | POA: Diagnosis not present

## 2017-05-06 DIAGNOSIS — N183 Chronic kidney disease, stage 3 (moderate): Secondary | ICD-10-CM

## 2017-05-06 DIAGNOSIS — Z794 Long term (current) use of insulin: Secondary | ICD-10-CM | POA: Diagnosis not present

## 2017-05-06 DIAGNOSIS — E782 Mixed hyperlipidemia: Secondary | ICD-10-CM | POA: Diagnosis not present

## 2017-05-06 DIAGNOSIS — E1165 Type 2 diabetes mellitus with hyperglycemia: Secondary | ICD-10-CM

## 2017-05-06 DIAGNOSIS — IMO0002 Reserved for concepts with insufficient information to code with codable children: Secondary | ICD-10-CM

## 2017-05-06 MED ORDER — FREESTYLE LIBRE SENSOR SYSTEM MISC: 2 refills | Status: DC

## 2017-05-06 MED ORDER — FREESTYLE LIBRE READER DEVI: 1.0000 | Freq: Once | 0 refills | Status: AC

## 2017-05-06 NOTE — Patient Instructions (Signed)
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
## 2017-05-14 ENCOUNTER — Telehealth: Payer: Self-pay

## 2017-05-14 MED ORDER — INSULIN LISPRO 100 UNIT/ML (KWIKPEN): PEN_INJECTOR | SUBCUTANEOUS | 2 refills | Status: DC

## 2017-05-14 NOTE — Telephone Encounter (Signed)
Melissa from [employer] called and wants to change the dose so it will be for 30 days and ins will pay and only charge one co-pay.  Can you please call Melissa at [phone]

## 2017-05-24 ENCOUNTER — Ambulatory Visit (INDEPENDENT_AMBULATORY_CARE_PROVIDER_SITE_OTHER): Payer: Medicare Other | Admitting: *Deleted

## 2017-05-24 DIAGNOSIS — I255 Ischemic cardiomyopathy: Secondary | ICD-10-CM | POA: Diagnosis not present

## 2017-05-25 LAB — CUP PACEART REMOTE DEVICE CHECK
Battery Remaining Longevity: 34 mo
Brady Statistic AP VP Percent: 0.38 %
Brady Statistic AP VS Percent: 0.06 %
Brady Statistic AS VP Percent: 92.68 %
HIGH POWER IMPEDANCE MEASURED VALUE: 69 Ohm
Implantable Lead Implant Date: 20150406
Implantable Lead Implant Date: 20150406
Implantable Lead Location: 753860
Implantable Lead Model: 4396
Implantable Lead Model: 5076
Implantable Pulse Generator Implant Date: 20150406
Lead Channel Impedance Value: 342 Ohm
Lead Channel Impedance Value: 342 Ohm
Lead Channel Impedance Value: 456 Ohm
Lead Channel Impedance Value: 532 Ohm
Lead Channel Impedance Value: 760 Ohm
Lead Channel Pacing Threshold Amplitude: 0.75 V
Lead Channel Pacing Threshold Amplitude: 1.5 V
Lead Channel Pacing Threshold Pulse Width: 0.4 ms
Lead Channel Pacing Threshold Pulse Width: 0.4 ms
Lead Channel Pacing Threshold Pulse Width: 0.6 ms
Lead Channel Sensing Intrinsic Amplitude: 17.75 mV
Lead Channel Setting Pacing Amplitude: 2 V
Lead Channel Setting Pacing Amplitude: 2.5 V
Lead Channel Setting Pacing Pulse Width: 0.4 ms
Lead Channel Setting Pacing Pulse Width: 0.6 ms
Lead Channel Setting Sensing Sensitivity: 0.3 mV
MDC IDC LEAD IMPLANT DT: 20060214
MDC IDC LEAD LOCATION: 753858
MDC IDC LEAD LOCATION: 753859
MDC IDC MSMT BATTERY VOLTAGE: 2.95 V
MDC IDC MSMT LEADCHNL RA IMPEDANCE VALUE: 646 Ohm
MDC IDC MSMT LEADCHNL RA SENSING INTR AMPL: 3.625 mV
MDC IDC MSMT LEADCHNL RA SENSING INTR AMPL: 3.625 mV
MDC IDC MSMT LEADCHNL RV PACING THRESHOLD AMPLITUDE: 0.875 V
MDC IDC MSMT LEADCHNL RV SENSING INTR AMPL: 17.75 mV
MDC IDC SESS DTM: 20180910062725
MDC IDC SET LEADCHNL LV PACING AMPLITUDE: 2.5 V
MDC IDC STAT BRADY AS VS PERCENT: 6.88 %
MDC IDC STAT BRADY RA PERCENT PACED: 0.44 %
MDC IDC STAT BRADY RV PERCENT PACED: 22.17 %

## 2017-05-25 NOTE — Progress Notes (Signed)
Remote ICD transmission.   

## 2017-05-26 ENCOUNTER — Encounter: Payer: Self-pay | Admitting: Cardiology

## 2017-06-02 ENCOUNTER — Telehealth: Payer: Self-pay

## 2017-06-02 NOTE — Telephone Encounter (Signed)
Increase Levemir to 50 units daily at bedtime, increase Humalog to 10 units 3 times a day before meals plus sliding scale.

## 2017-06-02 NOTE — Telephone Encounter (Signed)
Pt states she has had high BG readings.   Date Before breakfast Before lunch Before supper Bedtime  9/17 [phone]  9/18 296 [phone]  9/19 [phone]            Pt taking:  Levemir 40 units qhs, Humalog 8-16 units TIDAC

## 2017-06-03 DIAGNOSIS — E119 Type 2 diabetes mellitus without complications: Secondary | ICD-10-CM | POA: Diagnosis not present

## 2017-06-03 NOTE — Telephone Encounter (Signed)
Pt.notified

## 2017-06-03 NOTE — Telephone Encounter (Signed)
Left message for pt to call back  °

## 2017-06-30 ENCOUNTER — Other Ambulatory Visit: Payer: Self-pay | Admitting: "Endocrinology

## 2017-07-01 DIAGNOSIS — N183 Chronic kidney disease, stage 3 (moderate): Secondary | ICD-10-CM | POA: Diagnosis not present

## 2017-07-01 DIAGNOSIS — Z23 Encounter for immunization: Secondary | ICD-10-CM | POA: Diagnosis not present

## 2017-07-01 DIAGNOSIS — M545 Low back pain: Secondary | ICD-10-CM | POA: Diagnosis not present

## 2017-07-01 DIAGNOSIS — E1121 Type 2 diabetes mellitus with diabetic nephropathy: Secondary | ICD-10-CM | POA: Diagnosis not present

## 2017-07-01 DIAGNOSIS — I10 Essential (primary) hypertension: Secondary | ICD-10-CM | POA: Diagnosis not present

## 2017-07-16 ENCOUNTER — Ambulatory Visit (HOSPITAL_COMMUNITY): Payer: Medicare Other

## 2017-07-16 ENCOUNTER — Other Ambulatory Visit (HOSPITAL_COMMUNITY): Payer: Medicare Other

## 2017-07-22 ENCOUNTER — Other Ambulatory Visit: Payer: Self-pay | Admitting: "Endocrinology

## 2017-07-29 ENCOUNTER — Other Ambulatory Visit: Payer: Self-pay | Admitting: "Endocrinology

## 2017-07-30 ENCOUNTER — Encounter: Payer: Self-pay | Admitting: Internal Medicine

## 2017-07-30 ENCOUNTER — Ambulatory Visit (INDEPENDENT_AMBULATORY_CARE_PROVIDER_SITE_OTHER): Payer: Medicare Other | Admitting: Internal Medicine

## 2017-07-30 ENCOUNTER — Other Ambulatory Visit: Payer: Self-pay | Admitting: "Endocrinology

## 2017-07-30 VITALS — BP 126/76 | HR 89 | Ht 61.0 in | Wt 145.0 lb

## 2017-07-30 DIAGNOSIS — Z95810 Presence of automatic (implantable) cardiac defibrillator: Secondary | ICD-10-CM

## 2017-07-30 DIAGNOSIS — I255 Ischemic cardiomyopathy: Secondary | ICD-10-CM

## 2017-07-30 DIAGNOSIS — I5042 Chronic combined systolic (congestive) and diastolic (congestive) heart failure: Secondary | ICD-10-CM

## 2017-07-30 DIAGNOSIS — Z794 Long term (current) use of insulin: Secondary | ICD-10-CM | POA: Diagnosis not present

## 2017-07-30 DIAGNOSIS — E782 Mixed hyperlipidemia: Secondary | ICD-10-CM | POA: Diagnosis not present

## 2017-07-30 DIAGNOSIS — N183 Chronic kidney disease, stage 3 (moderate): Secondary | ICD-10-CM | POA: Diagnosis not present

## 2017-07-30 DIAGNOSIS — E1165 Type 2 diabetes mellitus with hyperglycemia: Secondary | ICD-10-CM | POA: Diagnosis not present

## 2017-07-30 DIAGNOSIS — E1122 Type 2 diabetes mellitus with diabetic chronic kidney disease: Secondary | ICD-10-CM | POA: Diagnosis not present

## 2017-07-30 NOTE — Patient Instructions (Signed)
Medication Instructions:  Your physician recommends that you continue on your current medications as directed. Please refer to the Current Medication list given to you today.   Labwork: NONE   Testing/Procedures: NONE   Follow-Up: Your physician wants you to follow-up in: 1 Year with Dr. Lovena Le. You will receive a reminder letter in the mail two months in advance. If you don't receive a letter, please call our office to schedule the follow-up appointment.  Remote monitoring is used to monitor your Pacemaker of ICD from home. This monitoring reduces the number of office visits required to check your device to one time per year. It allows [name] to keep an eye on the functioning of your device to ensure it is working properly. You are scheduled for a device check from home on 08/24/17. You may send your transmission at any time that day. If you have a wireless device, the transmission will be sent automatically. After your physician reviews your transmission, you will receive a postcard with your next transmission date.   Any Other Special Instructions Will Be Listed Below (If Applicable).     If you need a refill on your cardiac medications before your next appointment, please call your pharmacy. Thank you for choosing [facility]!
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
## 2017-07-31 LAB — SPECIMEN STATUS REPORT

## 2017-07-31 LAB — LIPID PANEL W/O CHOL/HDL RATIO
CHOLESTEROL TOTAL: 202 mg/dL — AB (ref 100–199)
HDL: 40 mg/dL (ref >39)
LDL CALC: 92 mg/dL (ref 0–99)
Triglycerides: 348 mg/dL — ABNORMAL HIGH (ref 0–149)
VLDL CHOLESTEROL CAL: 70 mg/dL — AB (ref 5–40)

## 2017-07-31 LAB — RENAL FUNCTION PANEL
Albumin: 4.3 g/dL (ref 3.6–4.8)
BUN / CREAT RATIO: 17 (ref 12–28)
BUN: 26 mg/dL (ref 8–27)
CO2: 20 mmol/L (ref 20–29)
CREATININE: 1.51 mg/dL — AB (ref 0.57–1.00)
Calcium: 9.3 mg/dL (ref 8.7–10.3)
Chloride: 101 mmol/L (ref 96–106)
GFR calc non Af Amer: 36 mL/min/{1.73_m2} — ABNORMAL LOW (ref >59)
GFR, EST AFRICAN AMERICAN: 42 mL/min/{1.73_m2} — AB (ref >59)
Glucose: 190 mg/dL — ABNORMAL HIGH (ref 65–99)
Phosphorus: 4.9 mg/dL — ABNORMAL HIGH (ref 2.5–4.5)
Potassium: 4.4 mmol/L (ref 3.5–5.2)
Sodium: 139 mmol/L (ref 134–144)

## 2017-07-31 LAB — HGB A1C W/O EAG: Hgb A1c MFr Bld: 7.9 % — ABNORMAL HIGH (ref 4.8–5.6)

## 2017-08-02 ENCOUNTER — Other Ambulatory Visit: Payer: Self-pay | Admitting: Internal Medicine

## 2017-08-04 ENCOUNTER — Other Ambulatory Visit: Payer: Self-pay

## 2017-08-09 ENCOUNTER — Ambulatory Visit (INDEPENDENT_AMBULATORY_CARE_PROVIDER_SITE_OTHER): Payer: Medicare Other | Admitting: "Endocrinology

## 2017-08-09 ENCOUNTER — Encounter: Payer: Self-pay | Admitting: "Endocrinology

## 2017-08-09 VITALS — BP 132/83 | HR 81 | Ht 61.0 in | Wt 148.0 lb

## 2017-08-09 DIAGNOSIS — E1122 Type 2 diabetes mellitus with diabetic chronic kidney disease: Secondary | ICD-10-CM

## 2017-08-09 DIAGNOSIS — N183 Chronic kidney disease, stage 3 (moderate): Secondary | ICD-10-CM

## 2017-08-09 DIAGNOSIS — E782 Mixed hyperlipidemia: Secondary | ICD-10-CM | POA: Diagnosis not present

## 2017-08-09 DIAGNOSIS — Z794 Long term (current) use of insulin: Secondary | ICD-10-CM | POA: Diagnosis not present

## 2017-08-09 DIAGNOSIS — E1165 Type 2 diabetes mellitus with hyperglycemia: Secondary | ICD-10-CM | POA: Diagnosis not present

## 2017-08-09 DIAGNOSIS — I10 Essential (primary) hypertension: Secondary | ICD-10-CM

## 2017-08-09 DIAGNOSIS — IMO0002 Reserved for concepts with insufficient information to code with codable children: Secondary | ICD-10-CM
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
## 2017-08-09 NOTE — Patient Instructions (Signed)

## 2017-08-17 LAB — CUP PACEART INCLINIC DEVICE CHECK
Battery Remaining Longevity: 31 mo
Brady Statistic AP VS Percent: 0.03 %
Brady Statistic AS VS Percent: 3.1 %
Brady Statistic RV Percent Paced: 25.33 %
Date Time Interrogation Session: 20181116160616
HIGH POWER IMPEDANCE MEASURED VALUE: 76 Ohm
Implantable Lead Implant Date: 20150406
Implantable Lead Location: 753859
Implantable Pulse Generator Implant Date: 20150406
Lead Channel Impedance Value: 342 Ohm
Lead Channel Impedance Value: 399 Ohm
Lead Channel Pacing Threshold Amplitude: 0.75 V
Lead Channel Pacing Threshold Amplitude: 1.5 V
Lead Channel Pacing Threshold Pulse Width: 0.4 ms
Lead Channel Sensing Intrinsic Amplitude: 19.625 mV
Lead Channel Setting Pacing Amplitude: 2.5 V
Lead Channel Setting Pacing Pulse Width: 0.4 ms
Lead Channel Setting Sensing Sensitivity: 0.3 mV
MDC IDC LEAD IMPLANT DT: 20060214
MDC IDC LEAD IMPLANT DT: 20150406
MDC IDC LEAD LOCATION: 753858
MDC IDC LEAD LOCATION: 753860
MDC IDC MSMT BATTERY VOLTAGE: 2.95 V
MDC IDC MSMT LEADCHNL LV IMPEDANCE VALUE: 532 Ohm
MDC IDC MSMT LEADCHNL LV IMPEDANCE VALUE: 779 Ohm
MDC IDC MSMT LEADCHNL LV PACING THRESHOLD PULSEWIDTH: 0.6 ms
MDC IDC MSMT LEADCHNL RA IMPEDANCE VALUE: 646 Ohm
MDC IDC MSMT LEADCHNL RA SENSING INTR AMPL: 3.5 mV
MDC IDC MSMT LEADCHNL RV IMPEDANCE VALUE: 475 Ohm
MDC IDC MSMT LEADCHNL RV PACING THRESHOLD AMPLITUDE: 0.75 V
MDC IDC MSMT LEADCHNL RV PACING THRESHOLD PULSEWIDTH: 0.4 ms
MDC IDC SET LEADCHNL LV PACING AMPLITUDE: 2.5 V
MDC IDC SET LEADCHNL LV PACING PULSEWIDTH: 0.6 ms
MDC IDC SET LEADCHNL RA PACING AMPLITUDE: 2 V
MDC IDC STAT BRADY AP VP PERCENT: 0.1 %
MDC IDC STAT BRADY AS VP PERCENT: 96.78 %
MDC IDC STAT BRADY RA PERCENT PACED: 0.12 %

## 2017-08-24 ENCOUNTER — Ambulatory Visit (INDEPENDENT_AMBULATORY_CARE_PROVIDER_SITE_OTHER): Payer: Medicare Other | Admitting: *Deleted

## 2017-08-24 DIAGNOSIS — I255 Ischemic cardiomyopathy: Secondary | ICD-10-CM | POA: Diagnosis not present

## 2017-08-24 NOTE — Progress Notes (Signed)
Remote ICD transmission.   

## 2017-08-26 LAB — CUP PACEART REMOTE DEVICE CHECK
Brady Statistic AP VP Percent: 0.03 %
Brady Statistic AP VS Percent: 0.02 %
Brady Statistic AS VP Percent: 98.5 %
Brady Statistic AS VS Percent: 1.46 %
Brady Statistic RV Percent Paced: 26.21 %
Date Time Interrogation Session: 20181211093824
HighPow Impedance: 73 Ohm
Implantable Lead Implant Date: 20060214
Implantable Lead Implant Date: 20150406
Implantable Lead Implant Date: 20150406
Implantable Lead Location: 753858
Lead Channel Impedance Value: 361 Ohm
Lead Channel Impedance Value: 361 Ohm
Lead Channel Impedance Value: 456 Ohm
Lead Channel Impedance Value: 779 Ohm
Lead Channel Pacing Threshold Amplitude: 1.625 V
Lead Channel Pacing Threshold Pulse Width: 0.4 ms
Lead Channel Pacing Threshold Pulse Width: 0.6 ms
Lead Channel Sensing Intrinsic Amplitude: 2.875 mV
Lead Channel Sensing Intrinsic Amplitude: 2.875 mV
Lead Channel Setting Pacing Amplitude: 2 V
Lead Channel Setting Pacing Amplitude: 2.5 V
Lead Channel Setting Pacing Pulse Width: 0.4 ms
MDC IDC LEAD LOCATION: 753859
MDC IDC LEAD LOCATION: 753860
MDC IDC MSMT BATTERY REMAINING LONGEVITY: 28 mo
MDC IDC MSMT BATTERY VOLTAGE: 2.94 V
MDC IDC MSMT LEADCHNL LV IMPEDANCE VALUE: 532 Ohm
MDC IDC MSMT LEADCHNL RA IMPEDANCE VALUE: 665 Ohm
MDC IDC MSMT LEADCHNL RA PACING THRESHOLD AMPLITUDE: 0.625 V
MDC IDC MSMT LEADCHNL RV PACING THRESHOLD AMPLITUDE: 0.875 V
MDC IDC MSMT LEADCHNL RV PACING THRESHOLD PULSEWIDTH: 0.4 ms
MDC IDC MSMT LEADCHNL RV SENSING INTR AMPL: 18.75 mV
MDC IDC MSMT LEADCHNL RV SENSING INTR AMPL: 18.75 mV
MDC IDC PG IMPLANT DT: 20150406
MDC IDC SET LEADCHNL LV PACING AMPLITUDE: 2.75 V
MDC IDC SET LEADCHNL LV PACING PULSEWIDTH: 0.6 ms
MDC IDC SET LEADCHNL RV SENSING SENSITIVITY: 0.3 mV
MDC IDC STAT BRADY RA PERCENT PACED: 0.04 %

## 2017-08-27 ENCOUNTER — Encounter: Payer: Self-pay | Admitting: Cardiology

## 2017-09-06 ENCOUNTER — Other Ambulatory Visit: Payer: Self-pay | Admitting: "Endocrinology

## 2017-09-30 DIAGNOSIS — N183 Chronic kidney disease, stage 3 (moderate): Secondary | ICD-10-CM | POA: Diagnosis not present

## 2017-09-30 DIAGNOSIS — I10 Essential (primary) hypertension: Secondary | ICD-10-CM | POA: Diagnosis not present

## 2017-09-30 DIAGNOSIS — Z79891 Long term (current) use of opiate analgesic: Secondary | ICD-10-CM | POA: Diagnosis not present

## 2017-09-30 DIAGNOSIS — E1121 Type 2 diabetes mellitus with diabetic nephropathy: Secondary | ICD-10-CM | POA: Diagnosis not present

## 2017-09-30 DIAGNOSIS — M545 Low back pain: Secondary | ICD-10-CM | POA: Diagnosis not present

## 2017-10-04 ENCOUNTER — Other Ambulatory Visit: Payer: Self-pay | Admitting: Internal Medicine

## 2017-10-04 ENCOUNTER — Other Ambulatory Visit: Payer: Self-pay | Admitting: "Endocrinology

## 2017-10-04 DIAGNOSIS — Z23 Encounter for immunization: Secondary | ICD-10-CM | POA: Diagnosis not present

## 2017-10-06 ENCOUNTER — Other Ambulatory Visit: Payer: Self-pay | Admitting: "Endocrinology

## 2017-10-12 ENCOUNTER — Other Ambulatory Visit: Payer: Self-pay

## 2017-10-12 ENCOUNTER — Other Ambulatory Visit (HOSPITAL_COMMUNITY)
Admission: RE | Admit: 2017-10-12 | Discharge: 2017-10-12 | Disposition: A | Discharge status: Home / Self Care | Payer: Medicare Other | Source: Ambulatory Visit | Attending: Pulmonary Disease | Admitting: Pulmonary Disease

## 2017-10-12 DIAGNOSIS — G44329 Chronic post-traumatic headache, not intractable: Secondary | ICD-10-CM | POA: Insufficient documentation

## 2017-10-12 DIAGNOSIS — N183 Chronic kidney disease, stage 3 (moderate): Secondary | ICD-10-CM | POA: Diagnosis not present

## 2017-10-12 DIAGNOSIS — F4321 Adjustment disorder with depressed mood: Secondary | ICD-10-CM | POA: Insufficient documentation

## 2017-10-12 DIAGNOSIS — I10 Essential (primary) hypertension: Secondary | ICD-10-CM | POA: Diagnosis not present

## 2017-10-12 DIAGNOSIS — Z79891 Long term (current) use of opiate analgesic: Secondary | ICD-10-CM | POA: Diagnosis not present

## 2017-10-12 LAB — CBC
HEMATOCRIT: 35.1 % — AB (ref 36.0–46.0)
HEMOGLOBIN: 10.8 g/dL — AB (ref 12.0–15.0)
MCH: 29.1 pg (ref 26.0–34.0)
MCHC: 30.8 g/dL (ref 30.0–36.0)
MCV: 94.6 fL (ref 78.0–100.0)
Platelets: 305 10*3/uL (ref 150–400)
RBC: 3.71 MIL/uL — ABNORMAL LOW (ref 3.87–5.11)
RDW: 13.1 % (ref 11.5–15.5)
WBC: 5.8 10*3/uL (ref 4.0–10.5)

## 2017-10-12 LAB — LIPID PANEL
Cholesterol: 235 mg/dL — ABNORMAL HIGH (ref 0–200)
HDL: 39 mg/dL — AB (ref >40)
LDL Cholesterol: UNDETERMINED mg/dL (ref 0–99)
TRIGLYCERIDES: 550 mg/dL — AB (ref <150)
Total CHOL/HDL Ratio: 6 RATIO
VLDL: UNDETERMINED mg/dL (ref 0–40)

## 2017-10-12 LAB — COMPREHENSIVE METABOLIC PANEL
ALBUMIN: 4.1 g/dL (ref 3.5–5.0)
ALT: 26 U/L (ref 14–54)
ANION GAP: 12 (ref 5–15)
AST: 23 U/L (ref 15–41)
Alkaline Phosphatase: 93 U/L (ref 38–126)
BILIRUBIN TOTAL: 0.5 mg/dL (ref 0.3–1.2)
BUN: 36 mg/dL — AB (ref 6–20)
CO2: 27 mmol/L (ref 22–32)
Calcium: 9.6 mg/dL (ref 8.9–10.3)
Chloride: 99 mmol/L — ABNORMAL LOW (ref 101–111)
Creatinine, Ser: 1.42 mg/dL — ABNORMAL HIGH (ref 0.44–1.00)
GFR calc Af Amer: 44 mL/min — ABNORMAL LOW (ref >60)
GFR calc non Af Amer: 38 mL/min — ABNORMAL LOW (ref >60)
GLUCOSE: 162 mg/dL — AB (ref 65–99)
Potassium: 4.2 mmol/L (ref 3.5–5.1)
Sodium: 138 mmol/L (ref 135–145)
TOTAL PROTEIN: 7.5 g/dL (ref 6.5–8.1)

## 2017-10-12 LAB — HEMOGLOBIN A1C
HEMOGLOBIN A1C: 8.3 % — AB (ref 4.8–5.6)
MEAN PLASMA GLUCOSE: 191.51 mg/dL

## 2017-10-12 LAB — TSH: TSH: 1.956 u[IU]/mL (ref 0.350–4.500)

## 2017-10-12 LAB — RAPID URINE DRUG SCREEN, HOSP PERFORMED
Amphetamines: NOT DETECTED
BENZODIAZEPINES: NOT DETECTED
Barbiturates: NOT DETECTED
Cocaine: NOT DETECTED
Opiates: POSITIVE — AB
Tetrahydrocannabinol: NOT DETECTED

## 2017-10-12 MED ORDER — INSULIN DETEMIR 100 UNIT/ML FLEXPEN: 40.0000 [IU] | PEN_INJECTOR | Freq: Every day | SUBCUTANEOUS | 2 refills | Status: DC

## 2017-11-01 ENCOUNTER — Other Ambulatory Visit (HOSPITAL_COMMUNITY): Payer: Self-pay | Admitting: Adult Health

## 2017-11-01 DIAGNOSIS — D509 Iron deficiency anemia, unspecified: Secondary | ICD-10-CM

## 2017-11-01 NOTE — Telephone Encounter (Signed)
Pt has no follow-up scheduled.  I saw her last in 03/2017 and it was recommended she return in 4 months for FU with labs.  Appears that she called and canceled appointments in 07/2017 and did not wish to reschedule.     I would prefer we see her to continue to prescribe her oral iron supplements.  Please call patient to check on her. If she has decided to seek care elsewhere, please let me know.   Mike Craze, NP [place] [phone]

## 2017-11-02 ENCOUNTER — Other Ambulatory Visit: Payer: Self-pay | Admitting: Internal Medicine

## 2017-11-02 ENCOUNTER — Other Ambulatory Visit: Payer: Self-pay

## 2017-11-02 ENCOUNTER — Telehealth: Payer: Self-pay | Admitting: "Endocrinology

## 2017-11-02 MED ORDER — FUROSEMIDE 40 MG PO TABS: 120.0000 mg | ORAL_TABLET | Freq: Every day | ORAL | 2 refills | Status: DC

## 2017-11-02 MED ORDER — INSULIN GLARGINE 100 UNIT/ML SOLOSTAR PEN: 50.0000 [IU] | PEN_INJECTOR | Freq: Every day | SUBCUTANEOUS | 0 refills | Status: DC

## 2017-11-02 NOTE — Telephone Encounter (Signed)
New Message    *STAT* If patient is at the pharmacy, call can be transferred to refill team.   1. Which medications need to be refilled? (please list name of each medication and dose if known) digoxin (LANOXIN) 0.125 MG tablet, carvedilol (COREG) 6.25 MG tablet and furosemide (LASIX) 40 MG tablet  2. Which pharmacy/location (including street and city if local pharmacy) is medication to be sent to [facility]  3. Do they need a 30 day or 90 day supply? 90  Patient states that [facility] is new rx on file.

## 2017-11-02 NOTE — Telephone Encounter (Signed)
[name] is calling stating that she is changing pharmacies she is going to [facility] now and she is requesting her Rx's from Dr. Dorris Fetch to be sent or faxed to # [phone], please advise?

## 2017-11-02 NOTE — Telephone Encounter (Signed)
Pt's medication was sent to pt's pharmacy as requested. Confirmation received.  °

## 2017-11-10 ENCOUNTER — Ambulatory Visit: Payer: Medicare Other | Admitting: "Endocrinology

## 2017-11-11 ENCOUNTER — Encounter: Payer: Self-pay | Admitting: *Deleted

## 2017-11-15 ENCOUNTER — Other Ambulatory Visit: Payer: Self-pay | Admitting: "Endocrinology

## 2017-11-15 DIAGNOSIS — Z794 Long term (current) use of insulin: Secondary | ICD-10-CM | POA: Diagnosis not present

## 2017-11-15 DIAGNOSIS — E1122 Type 2 diabetes mellitus with diabetic chronic kidney disease: Secondary | ICD-10-CM | POA: Diagnosis not present

## 2017-11-15 DIAGNOSIS — E1165 Type 2 diabetes mellitus with hyperglycemia: Secondary | ICD-10-CM | POA: Diagnosis not present

## 2017-11-15 DIAGNOSIS — N183 Chronic kidney disease, stage 3 (moderate): Secondary | ICD-10-CM | POA: Diagnosis not present

## 2017-11-16 LAB — COMPREHENSIVE METABOLIC PANEL
ALBUMIN: 4.7 g/dL (ref 3.6–4.8)
ALT: 19 IU/L (ref 0–32)
AST: 21 IU/L (ref 0–40)
Albumin/Globulin Ratio: 1.7 (ref 1.2–2.2)
Alkaline Phosphatase: 98 IU/L (ref 39–117)
BILIRUBIN TOTAL: 0.2 mg/dL (ref 0.0–1.2)
BUN / CREAT RATIO: 21 (ref 12–28)
BUN: 41 mg/dL — ABNORMAL HIGH (ref 8–27)
CHLORIDE: 103 mmol/L (ref 96–106)
CO2: 23 mmol/L (ref 20–29)
CREATININE: 1.96 mg/dL — AB (ref 0.57–1.00)
Calcium: 9.4 mg/dL (ref 8.7–10.3)
GFR calc non Af Amer: 26 mL/min/{1.73_m2} — ABNORMAL LOW (ref >59)
GFR, EST AFRICAN AMERICAN: 30 mL/min/{1.73_m2} — AB (ref >59)
GLOBULIN, TOTAL: 2.7 g/dL (ref 1.5–4.5)
Glucose: 181 mg/dL — ABNORMAL HIGH (ref 65–99)
Potassium: 4.8 mmol/L (ref 3.5–5.2)
Sodium: 140 mmol/L (ref 134–144)
TOTAL PROTEIN: 7.4 g/dL (ref 6.0–8.5)

## 2017-11-16 LAB — SPECIMEN STATUS REPORT

## 2017-11-16 LAB — HGB A1C W/O EAG: Hgb A1c MFr Bld: 7.9 % — ABNORMAL HIGH (ref 4.8–5.6)

## 2017-11-22 ENCOUNTER — Ambulatory Visit (INDEPENDENT_AMBULATORY_CARE_PROVIDER_SITE_OTHER): Payer: Medicare Other | Admitting: "Endocrinology

## 2017-11-22 ENCOUNTER — Encounter: Payer: Self-pay | Admitting: "Endocrinology

## 2017-11-22 VITALS — BP 136/82 | HR 89 | Ht 61.0 in | Wt 142.0 lb

## 2017-11-22 DIAGNOSIS — E1165 Type 2 diabetes mellitus with hyperglycemia: Secondary | ICD-10-CM | POA: Diagnosis not present

## 2017-11-22 DIAGNOSIS — E1122 Type 2 diabetes mellitus with diabetic chronic kidney disease: Secondary | ICD-10-CM

## 2017-11-22 DIAGNOSIS — IMO0002 Reserved for concepts with insufficient information to code with codable children: Secondary | ICD-10-CM

## 2017-11-22 DIAGNOSIS — N183 Chronic kidney disease, stage 3 (moderate): Secondary | ICD-10-CM

## 2017-11-22 DIAGNOSIS — Z794 Long term (current) use of insulin: Secondary | ICD-10-CM | POA: Diagnosis not present

## 2017-11-22 DIAGNOSIS — I10 Essential (primary) hypertension: Secondary | ICD-10-CM | POA: Diagnosis not present

## 2017-11-22 DIAGNOSIS — E782 Mixed hyperlipidemia: Secondary | ICD-10-CM | POA: Diagnosis not present

## 2017-11-22 MED ORDER — FREESTYLE LIBRE 14 DAY READER DEVI: 1.0000 | 0 refills | Status: DC

## 2017-11-22 MED ORDER — FREESTYLE LIBRE 14 DAY SENSOR MISC: 1.0000 | 3 refills | Status: DC
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
## 2017-11-23 ENCOUNTER — Ambulatory Visit (INDEPENDENT_AMBULATORY_CARE_PROVIDER_SITE_OTHER): Payer: Medicare Other | Admitting: *Deleted

## 2017-11-23 DIAGNOSIS — I255 Ischemic cardiomyopathy: Secondary | ICD-10-CM

## 2017-11-23 NOTE — Progress Notes (Signed)
Remote ICD transmission.   

## 2017-11-24 ENCOUNTER — Encounter: Payer: Self-pay | Admitting: Cardiology

## 2017-11-25 ENCOUNTER — Other Ambulatory Visit: Payer: Self-pay | Admitting: "Endocrinology

## 2017-11-26 ENCOUNTER — Other Ambulatory Visit (HOSPITAL_COMMUNITY): Payer: Self-pay | Admitting: Pulmonary Disease

## 2017-11-26 DIAGNOSIS — Z1231 Encounter for screening mammogram for malignant neoplasm of breast: Secondary | ICD-10-CM

## 2017-11-29 ENCOUNTER — Ambulatory Visit (HOSPITAL_COMMUNITY)
Admission: RE | Admit: 2017-11-29 | Discharge: 2017-11-29 | Disposition: A | Discharge status: Home / Self Care | Payer: Medicare Other | Source: Ambulatory Visit | Attending: Pulmonary Disease | Admitting: Pulmonary Disease

## 2017-11-29 ENCOUNTER — Encounter (HOSPITAL_COMMUNITY): Payer: Self-pay

## 2017-11-29 DIAGNOSIS — Z1231 Encounter for screening mammogram for malignant neoplasm of breast: Secondary | ICD-10-CM | POA: Insufficient documentation

## 2017-11-30 LAB — CUP PACEART REMOTE DEVICE CHECK
Brady Statistic AP VP Percent: 0.02 %
Brady Statistic AP VS Percent: 0.01 %
Brady Statistic AS VP Percent: 98.36 %
Brady Statistic AS VS Percent: 1.6 %
Brady Statistic RV Percent Paced: 27.55 %
Date Time Interrogation Session: 20190312082505
HighPow Impedance: 82 Ohm
Implantable Lead Implant Date: 20060214
Implantable Lead Implant Date: 20150406
Implantable Lead Implant Date: 20150406
Implantable Lead Location: 753858
Implantable Lead Model: 4396
Implantable Lead Model: 5076
Lead Channel Impedance Value: 399 Ohm
Lead Channel Impedance Value: 456 Ohm
Lead Channel Impedance Value: 513 Ohm
Lead Channel Impedance Value: 760 Ohm
Lead Channel Pacing Threshold Amplitude: 1 V
Lead Channel Pacing Threshold Amplitude: 1.75 V
Lead Channel Pacing Threshold Pulse Width: 0.4 ms
Lead Channel Sensing Intrinsic Amplitude: 2.75 mV
Lead Channel Sensing Intrinsic Amplitude: 2.75 mV
Lead Channel Setting Pacing Amplitude: 2 V
Lead Channel Setting Pacing Pulse Width: 0.4 ms
MDC IDC LEAD LOCATION: 753859
MDC IDC LEAD LOCATION: 753860
MDC IDC MSMT BATTERY REMAINING LONGEVITY: 23 mo
MDC IDC MSMT BATTERY VOLTAGE: 2.93 V
MDC IDC MSMT LEADCHNL LV PACING THRESHOLD PULSEWIDTH: 0.6 ms
MDC IDC MSMT LEADCHNL RA IMPEDANCE VALUE: 665 Ohm
MDC IDC MSMT LEADCHNL RA PACING THRESHOLD AMPLITUDE: 0.75 V
MDC IDC MSMT LEADCHNL RV IMPEDANCE VALUE: 361 Ohm
MDC IDC MSMT LEADCHNL RV PACING THRESHOLD PULSEWIDTH: 0.4 ms
MDC IDC MSMT LEADCHNL RV SENSING INTR AMPL: 26.25 mV
MDC IDC MSMT LEADCHNL RV SENSING INTR AMPL: 26.25 mV
MDC IDC PG IMPLANT DT: 20150406
MDC IDC SET LEADCHNL LV PACING AMPLITUDE: 2.75 V
MDC IDC SET LEADCHNL LV PACING PULSEWIDTH: 0.6 ms
MDC IDC SET LEADCHNL RV PACING AMPLITUDE: 2.5 V
MDC IDC SET LEADCHNL RV SENSING SENSITIVITY: 0.3 mV
MDC IDC STAT BRADY RA PERCENT PACED: 0.04 %

## 2017-12-07 ENCOUNTER — Telehealth: Payer: Self-pay | Admitting: Internal Medicine

## 2017-12-07 NOTE — Telephone Encounter (Signed)
Pt states she's having some pain in her left shoulder at her defib. States no CP and no SOB has been going on for about 2 days.

## 2017-12-07 NOTE — Telephone Encounter (Signed)
LVM for call back

## 2017-12-08 NOTE — Telephone Encounter (Signed)
[name] reports that she has pain around the device site x 3 days. Denies redness, swelling or drainage. Denies fever and chills. She reports that she was pulling weeds recently but is right handed and doubts that it may have impacted her left-sided device. She denies any recent infections. She will come to the device clinic tomorrow at 3 to be evaluated.

## 2017-12-09 ENCOUNTER — Ambulatory Visit (INDEPENDENT_AMBULATORY_CARE_PROVIDER_SITE_OTHER): Payer: Self-pay | Admitting: *Deleted

## 2017-12-09 DIAGNOSIS — I255 Ischemic cardiomyopathy: Secondary | ICD-10-CM

## 2017-12-09 NOTE — Progress Notes (Signed)
Device site assessed due to pain, no redness, no edema, pt reported pain started 3 days ago, pt denies any kind of trauma to site, pt stated it hurts when she moves her arm a certain way and radiates.  Assessed by JA recommended pt to apply heat and ibuprofen, pt voiced understanding of this.

## 2017-12-27 ENCOUNTER — Other Ambulatory Visit: Payer: Self-pay | Admitting: "Endocrinology

## 2017-12-29 DIAGNOSIS — E785 Hyperlipidemia, unspecified: Secondary | ICD-10-CM | POA: Diagnosis not present

## 2017-12-29 DIAGNOSIS — N183 Chronic kidney disease, stage 3 (moderate): Secondary | ICD-10-CM | POA: Diagnosis not present

## 2017-12-29 DIAGNOSIS — E1165 Type 2 diabetes mellitus with hyperglycemia: Secondary | ICD-10-CM | POA: Diagnosis not present

## 2017-12-29 DIAGNOSIS — E1121 Type 2 diabetes mellitus with diabetic nephropathy: Secondary | ICD-10-CM | POA: Diagnosis not present

## 2017-12-30 ENCOUNTER — Other Ambulatory Visit: Payer: Self-pay

## 2017-12-30 ENCOUNTER — Other Ambulatory Visit: Payer: Self-pay | Admitting: "Endocrinology

## 2017-12-30 ENCOUNTER — Other Ambulatory Visit (HOSPITAL_COMMUNITY): Payer: Self-pay | Admitting: Adult Health

## 2017-12-30 DIAGNOSIS — D509 Iron deficiency anemia, unspecified: Secondary | ICD-10-CM

## 2017-12-30 MED ORDER — INSULIN DETEMIR 100 UNIT/ML FLEXPEN: 40.0000 [IU] | PEN_INJECTOR | Freq: Every day | SUBCUTANEOUS | 2 refills | Status: DC

## 2017-12-30 MED ORDER — FREESTYLE LIBRE 14 DAY SENSOR MISC: 1.0000 | 3 refills | Status: DC

## 2017-12-30 MED ORDER — FREESTYLE LIBRE 14 DAY READER DEVI: 1.0000 | 0 refills | Status: DC

## 2017-12-30 NOTE — Telephone Encounter (Signed)
She has not been seen since 03/2017.  She needs to return for follow-up visit; please help get her scheduled if she is willing.   Mike Craze, NP [place] [phone]

## 2018-01-05 ENCOUNTER — Other Ambulatory Visit: Payer: Self-pay | Admitting: "Endocrinology

## 2018-01-05 DIAGNOSIS — E559 Vitamin D deficiency, unspecified: Secondary | ICD-10-CM | POA: Diagnosis not present

## 2018-01-05 DIAGNOSIS — N183 Chronic kidney disease, stage 3 (moderate): Secondary | ICD-10-CM | POA: Diagnosis not present

## 2018-01-05 DIAGNOSIS — R809 Proteinuria, unspecified: Secondary | ICD-10-CM | POA: Diagnosis not present

## 2018-01-05 DIAGNOSIS — D519 Vitamin B12 deficiency anemia, unspecified: Secondary | ICD-10-CM | POA: Diagnosis not present

## 2018-01-05 DIAGNOSIS — I10 Essential (primary) hypertension: Secondary | ICD-10-CM | POA: Diagnosis not present

## 2018-01-05 DIAGNOSIS — Z79899 Other long term (current) drug therapy: Secondary | ICD-10-CM | POA: Diagnosis not present

## 2018-01-05 DIAGNOSIS — D509 Iron deficiency anemia, unspecified: Secondary | ICD-10-CM | POA: Diagnosis not present

## 2018-01-12 DIAGNOSIS — N183 Chronic kidney disease, stage 3 (moderate): Secondary | ICD-10-CM | POA: Diagnosis not present

## 2018-01-12 DIAGNOSIS — E559 Vitamin D deficiency, unspecified: Secondary | ICD-10-CM | POA: Diagnosis not present

## 2018-01-12 DIAGNOSIS — N2581 Secondary hyperparathyroidism of renal origin: Secondary | ICD-10-CM | POA: Diagnosis not present

## 2018-01-12 DIAGNOSIS — R809 Proteinuria, unspecified: Secondary | ICD-10-CM | POA: Diagnosis not present

## 2018-02-08 ENCOUNTER — Encounter: Payer: Self-pay | Admitting: Adult Health

## 2018-02-08 ENCOUNTER — Ambulatory Visit (INDEPENDENT_AMBULATORY_CARE_PROVIDER_SITE_OTHER): Payer: Medicare Other | Admitting: Adult Health

## 2018-02-08 VITALS — BP 116/60 | HR 82 | Ht 61.0 in | Wt 145.0 lb

## 2018-02-08 DIAGNOSIS — Z1211 Encounter for screening for malignant neoplasm of colon: Secondary | ICD-10-CM

## 2018-02-08 DIAGNOSIS — Z780 Asymptomatic menopausal state: Secondary | ICD-10-CM | POA: Diagnosis not present

## 2018-02-08 DIAGNOSIS — Z01411 Encounter for gynecological examination (general) (routine) with abnormal findings: Secondary | ICD-10-CM | POA: Diagnosis not present

## 2018-02-08 DIAGNOSIS — Z1212 Encounter for screening for malignant neoplasm of rectum: Secondary | ICD-10-CM | POA: Diagnosis not present

## 2018-02-08 DIAGNOSIS — K5900 Constipation, unspecified: Secondary | ICD-10-CM | POA: Diagnosis not present

## 2018-02-08 DIAGNOSIS — Z01419 Encounter for gynecological examination (general) (routine) without abnormal findings: Secondary | ICD-10-CM

## 2018-02-08 LAB — HEMOCCULT GUIAC POC 1CARD (OFFICE): FECAL OCCULT BLD: NEGATIVE

## 2018-02-08 NOTE — Progress Notes (Signed)
Patient ID: [name], female   DOB: 08/24/1951, 67 y.o.   MRN: [id number] History of Present Illness: [name] is a 67 year old white female, married, PM in for well woman gyn exam, she had normal pap with negative HPV 02/05/16. PCP is Dr Luan Pulling and she sees Dr Dorris Fetch and Dr Lovena Le, and Dr Lowanda Foster now.    Current Medications, Allergies, Past Medical History, Past Surgical History, Family History and Social History were reviewed in [employer] record.     Review of Systems: Patient denies any headaches, hearing loss, fatigue, blurred vision, shortness of breath, chest pain, abdominal pain, problems with bowel movements(constipated at times), urination(she does self cath), or intercourse.(not having sex) No joint pain or mood swings. Short term memory not as good, misplaces things.   Physical Exam:BP 116/60 (BP Location: Left Arm, Patient Position: Sitting, Cuff Size: Normal)   Pulse 82   Ht 5\' 1"  (1.549 m)   Wt 145 lb (65.8 kg)   BMI 27.40 kg/m   General:  Well developed, well nourished, no acute distress Skin:  Warm and dry,has fading bruise above left eye,( she does not remember falling or hitting it) Neck:  Midline trachea, normal thyroid, good ROM, no lymphadenopathy,no carotid bruits heard Lungs; Clear to auscultation bilaterally Breast:  No dominant palpable mass, retraction, or nipple discharge,pacemaker above left breast Cardiovascular: Regular rate and rhythm Abdomen:  Soft, non tender, no hepatosplenomegaly Pelvic:  External genitalia is normal in appearance, no lesions.  The vagina is normal in appearance for age, with loss of color,moisture and rugae. Urethra has no lesions or masses. The cervix is smooth.  Uterus is felt to be normal size, shape, and contour.  No adnexal masses or tenderness noted.Bladder is non tender, no masses felt. Rectal: Good sphincter tone, no polyps, or hemorrhoids felt.  Hemoccult  negative.+rectocele. Extremities/musculoskeletal:  No swelling or varicosities noted, no clubbing or cyanosis Psych:  No mood changes, alert and cooperative,seems happy PHQ 2 score 0.  Impression: 1. Well woman exam with routine gynecological exam   2. Screening for colorectal cancer   3. Postmenopause       Plan: DEXA scan ordered for 5/30 at 2:30 pm at [facility] Pap and physical in 2 years Mammogram yearly Labs with PCP and Dr Dorris Fetch  Colonoscopy per GI She is going to see Eye doctor soon

## 2018-02-10 ENCOUNTER — Ambulatory Visit (HOSPITAL_COMMUNITY)
Admission: RE | Admit: 2018-02-10 | Discharge: 2018-02-10 | Disposition: A | Discharge status: Home / Self Care | Payer: Medicare Other | Source: Ambulatory Visit | Attending: Adult Health | Admitting: Adult Health

## 2018-02-10 DIAGNOSIS — M85832 Other specified disorders of bone density and structure, left forearm: Secondary | ICD-10-CM | POA: Diagnosis not present

## 2018-02-10 DIAGNOSIS — Z780 Asymptomatic menopausal state: Secondary | ICD-10-CM | POA: Insufficient documentation

## 2018-02-18 ENCOUNTER — Other Ambulatory Visit: Payer: Self-pay | Admitting: "Endocrinology

## 2018-02-18 DIAGNOSIS — E1122 Type 2 diabetes mellitus with diabetic chronic kidney disease: Secondary | ICD-10-CM | POA: Diagnosis not present

## 2018-02-18 DIAGNOSIS — E1165 Type 2 diabetes mellitus with hyperglycemia: Secondary | ICD-10-CM | POA: Diagnosis not present

## 2018-02-18 DIAGNOSIS — N183 Chronic kidney disease, stage 3 (moderate): Secondary | ICD-10-CM | POA: Diagnosis not present

## 2018-02-18 DIAGNOSIS — Z794 Long term (current) use of insulin: Secondary | ICD-10-CM | POA: Diagnosis not present

## 2018-02-19 LAB — COMPREHENSIVE METABOLIC PANEL
A/G RATIO: 1.6 (ref 1.2–2.2)
ALK PHOS: 96 IU/L (ref 39–117)
ALT: 24 IU/L (ref 0–32)
AST: 22 IU/L (ref 0–40)
Albumin: 4.2 g/dL (ref 3.6–4.8)
BILIRUBIN TOTAL: 0.3 mg/dL (ref 0.0–1.2)
BUN/Creatinine Ratio: 17 (ref 12–28)
BUN: 29 mg/dL — AB (ref 8–27)
CHLORIDE: 102 mmol/L (ref 96–106)
CO2: 24 mmol/L (ref 20–29)
Calcium: 9.4 mg/dL (ref 8.7–10.3)
Creatinine, Ser: 1.66 mg/dL — ABNORMAL HIGH (ref 0.57–1.00)
GFR calc non Af Amer: 32 mL/min/{1.73_m2} — ABNORMAL LOW (ref >59)
GFR, EST AFRICAN AMERICAN: 37 mL/min/{1.73_m2} — AB (ref >59)
GLUCOSE: 220 mg/dL — AB (ref 65–99)
Globulin, Total: 2.7 g/dL (ref 1.5–4.5)
POTASSIUM: 5.5 mmol/L — AB (ref 3.5–5.2)
Sodium: 140 mmol/L (ref 134–144)
TOTAL PROTEIN: 6.9 g/dL (ref 6.0–8.5)

## 2018-02-19 LAB — HGB A1C W/O EAG: HEMOGLOBIN A1C: 8 % — AB (ref 4.8–5.6)

## 2018-02-22 ENCOUNTER — Ambulatory Visit (INDEPENDENT_AMBULATORY_CARE_PROVIDER_SITE_OTHER): Payer: Medicare Other | Admitting: *Deleted

## 2018-02-22 DIAGNOSIS — I255 Ischemic cardiomyopathy: Secondary | ICD-10-CM | POA: Diagnosis not present

## 2018-02-22 LAB — CUP PACEART REMOTE DEVICE CHECK
Brady Statistic AP VS Percent: 0.01 %
Brady Statistic AS VS Percent: 1.84 %
Date Time Interrogation Session: 20190611062725
HIGH POWER IMPEDANCE MEASURED VALUE: 72 Ohm
Implantable Lead Implant Date: 20060214
Implantable Lead Implant Date: 20150406
Implantable Lead Location: 753860
Implantable Pulse Generator Implant Date: 20150406
Lead Channel Impedance Value: 361 Ohm
Lead Channel Impedance Value: 456 Ohm
Lead Channel Impedance Value: 532 Ohm
Lead Channel Impedance Value: 665 Ohm
Lead Channel Pacing Threshold Amplitude: 0.75 V
Lead Channel Pacing Threshold Amplitude: 1.5 V
Lead Channel Pacing Threshold Pulse Width: 0.4 ms
Lead Channel Pacing Threshold Pulse Width: 0.4 ms
Lead Channel Sensing Intrinsic Amplitude: 17 mV
Lead Channel Sensing Intrinsic Amplitude: 17 mV
Lead Channel Sensing Intrinsic Amplitude: 3.375 mV
Lead Channel Setting Pacing Amplitude: 2 V
Lead Channel Setting Pacing Amplitude: 2.5 V
Lead Channel Setting Pacing Pulse Width: 0.6 ms
MDC IDC LEAD IMPLANT DT: 20150406
MDC IDC LEAD LOCATION: 753858
MDC IDC LEAD LOCATION: 753859
MDC IDC MSMT BATTERY REMAINING LONGEVITY: 20 mo
MDC IDC MSMT BATTERY VOLTAGE: 2.92 V
MDC IDC MSMT LEADCHNL LV IMPEDANCE VALUE: 779 Ohm
MDC IDC MSMT LEADCHNL LV PACING THRESHOLD PULSEWIDTH: 0.6 ms
MDC IDC MSMT LEADCHNL RA SENSING INTR AMPL: 3.375 mV
MDC IDC MSMT LEADCHNL RV IMPEDANCE VALUE: 361 Ohm
MDC IDC MSMT LEADCHNL RV PACING THRESHOLD AMPLITUDE: 0.875 V
MDC IDC SET LEADCHNL RV PACING AMPLITUDE: 2.5 V
MDC IDC SET LEADCHNL RV PACING PULSEWIDTH: 0.4 ms
MDC IDC SET LEADCHNL RV SENSING SENSITIVITY: 0.3 mV
MDC IDC STAT BRADY AP VP PERCENT: 0.03 %
MDC IDC STAT BRADY AS VP PERCENT: 98.12 %
MDC IDC STAT BRADY RA PERCENT PACED: 0.04 %
MDC IDC STAT BRADY RV PERCENT PACED: 23.83 %

## 2018-02-22 NOTE — Progress Notes (Signed)
Remote ICD transmission.   

## 2018-02-23 ENCOUNTER — Encounter: Payer: Self-pay | Admitting: Cardiology

## 2018-02-24 ENCOUNTER — Ambulatory Visit (INDEPENDENT_AMBULATORY_CARE_PROVIDER_SITE_OTHER): Payer: Medicare Other | Admitting: "Endocrinology

## 2018-02-24 ENCOUNTER — Encounter: Payer: Self-pay | Admitting: "Endocrinology

## 2018-02-24 VITALS — BP 132/82 | HR 86 | Ht 61.0 in | Wt 146.0 lb

## 2018-02-24 DIAGNOSIS — I255 Ischemic cardiomyopathy: Secondary | ICD-10-CM

## 2018-02-24 DIAGNOSIS — I10 Essential (primary) hypertension: Secondary | ICD-10-CM | POA: Diagnosis not present

## 2018-02-24 DIAGNOSIS — E1165 Type 2 diabetes mellitus with hyperglycemia: Secondary | ICD-10-CM | POA: Diagnosis not present

## 2018-02-24 DIAGNOSIS — IMO0002 Reserved for concepts with insufficient information to code with codable children: Secondary | ICD-10-CM

## 2018-02-24 DIAGNOSIS — N183 Chronic kidney disease, stage 3 (moderate): Secondary | ICD-10-CM | POA: Diagnosis not present

## 2018-02-24 DIAGNOSIS — E1122 Type 2 diabetes mellitus with diabetic chronic kidney disease: Secondary | ICD-10-CM

## 2018-02-24 DIAGNOSIS — Z794 Long term (current) use of insulin: Secondary | ICD-10-CM | POA: Diagnosis not present

## 2018-02-24 DIAGNOSIS — E782 Mixed hyperlipidemia: Secondary | ICD-10-CM | POA: Diagnosis not present

## 2018-02-24 MED ORDER — INSULIN GLARGINE 100 UNIT/ML SOLOSTAR PEN: 50.0000 [IU] | PEN_INJECTOR | Freq: Every day | SUBCUTANEOUS | 2 refills | Status: DC

## 2018-02-24 NOTE — Patient Instructions (Signed)
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
## 2018-03-04 ENCOUNTER — Other Ambulatory Visit: Payer: Self-pay | Admitting: "Endocrinology

## 2018-03-08 ENCOUNTER — Other Ambulatory Visit: Payer: Self-pay | Admitting: "Endocrinology

## 2018-03-22 DIAGNOSIS — H25013 Cortical age-related cataract, bilateral: Secondary | ICD-10-CM | POA: Diagnosis not present

## 2018-03-22 DIAGNOSIS — E119 Type 2 diabetes mellitus without complications: Secondary | ICD-10-CM | POA: Diagnosis not present

## 2018-03-30 DIAGNOSIS — E785 Hyperlipidemia, unspecified: Secondary | ICD-10-CM | POA: Diagnosis not present

## 2018-03-30 DIAGNOSIS — M546 Pain in thoracic spine: Secondary | ICD-10-CM | POA: Diagnosis not present

## 2018-03-30 DIAGNOSIS — E1121 Type 2 diabetes mellitus with diabetic nephropathy: Secondary | ICD-10-CM | POA: Diagnosis not present

## 2018-03-30 DIAGNOSIS — I129 Hypertensive chronic kidney disease with stage 1 through stage 4 chronic kidney disease, or unspecified chronic kidney disease: Secondary | ICD-10-CM | POA: Diagnosis not present

## 2018-03-30 DIAGNOSIS — Z79891 Long term (current) use of opiate analgesic: Secondary | ICD-10-CM | POA: Diagnosis not present

## 2018-03-31 DIAGNOSIS — E785 Hyperlipidemia, unspecified: Secondary | ICD-10-CM | POA: Diagnosis not present

## 2018-03-31 DIAGNOSIS — E1121 Type 2 diabetes mellitus with diabetic nephropathy: Secondary | ICD-10-CM | POA: Diagnosis not present

## 2018-03-31 DIAGNOSIS — I129 Hypertensive chronic kidney disease with stage 1 through stage 4 chronic kidney disease, or unspecified chronic kidney disease: Secondary | ICD-10-CM | POA: Diagnosis not present

## 2018-03-31 DIAGNOSIS — M546 Pain in thoracic spine: Secondary | ICD-10-CM | POA: Diagnosis not present

## 2018-05-09 ENCOUNTER — Encounter: Payer: Self-pay | Admitting: "Endocrinology

## 2018-05-09 DIAGNOSIS — E119 Type 2 diabetes mellitus without complications: Secondary | ICD-10-CM | POA: Diagnosis not present

## 2018-05-09 LAB — HM DIABETES EYE EXAM

## 2018-05-24 ENCOUNTER — Other Ambulatory Visit: Payer: Self-pay | Admitting: "Endocrinology

## 2018-05-24 ENCOUNTER — Ambulatory Visit (INDEPENDENT_AMBULATORY_CARE_PROVIDER_SITE_OTHER): Payer: Medicare Other | Admitting: *Deleted

## 2018-05-24 DIAGNOSIS — E1122 Type 2 diabetes mellitus with diabetic chronic kidney disease: Secondary | ICD-10-CM | POA: Diagnosis not present

## 2018-05-24 DIAGNOSIS — I10 Essential (primary) hypertension: Secondary | ICD-10-CM | POA: Diagnosis not present

## 2018-05-24 DIAGNOSIS — D509 Iron deficiency anemia, unspecified: Secondary | ICD-10-CM | POA: Diagnosis not present

## 2018-05-24 DIAGNOSIS — R809 Proteinuria, unspecified: Secondary | ICD-10-CM | POA: Diagnosis not present

## 2018-05-24 DIAGNOSIS — Z79899 Other long term (current) drug therapy: Secondary | ICD-10-CM | POA: Diagnosis not present

## 2018-05-24 DIAGNOSIS — E559 Vitamin D deficiency, unspecified: Secondary | ICD-10-CM | POA: Diagnosis not present

## 2018-05-24 DIAGNOSIS — N183 Chronic kidney disease, stage 3 (moderate): Secondary | ICD-10-CM | POA: Diagnosis not present

## 2018-05-24 DIAGNOSIS — I255 Ischemic cardiomyopathy: Secondary | ICD-10-CM | POA: Diagnosis not present

## 2018-05-24 DIAGNOSIS — Z794 Long term (current) use of insulin: Secondary | ICD-10-CM | POA: Diagnosis not present

## 2018-05-24 DIAGNOSIS — E1165 Type 2 diabetes mellitus with hyperglycemia: Secondary | ICD-10-CM | POA: Diagnosis not present

## 2018-05-24 NOTE — Progress Notes (Signed)
Remote ICD transmission.   

## 2018-05-25 LAB — COMPREHENSIVE METABOLIC PANEL
ALBUMIN: 4.5 g/dL (ref 3.6–4.8)
ALT: 28 IU/L (ref 0–32)
AST: 23 IU/L (ref 0–40)
Albumin/Globulin Ratio: 1.7 (ref 1.2–2.2)
Alkaline Phosphatase: 101 IU/L (ref 39–117)
BUN / CREAT RATIO: 23 (ref 12–28)
BUN: 38 mg/dL — AB (ref 8–27)
Bilirubin Total: 0.3 mg/dL (ref 0.0–1.2)
CALCIUM: 9.4 mg/dL (ref 8.7–10.3)
CO2: 22 mmol/L (ref 20–29)
CREATININE: 1.64 mg/dL — AB (ref 0.57–1.00)
Chloride: 100 mmol/L (ref 96–106)
GFR calc Af Amer: 38 mL/min/{1.73_m2} — ABNORMAL LOW (ref >59)
GFR calc non Af Amer: 33 mL/min/{1.73_m2} — ABNORMAL LOW (ref >59)
GLUCOSE: 199 mg/dL — AB (ref 65–99)
Globulin, Total: 2.6 g/dL (ref 1.5–4.5)
Potassium: 4.6 mmol/L (ref 3.5–5.2)
Sodium: 141 mmol/L (ref 134–144)
Total Protein: 7.1 g/dL (ref 6.0–8.5)

## 2018-05-25 LAB — HGB A1C W/O EAG: Hgb A1c MFr Bld: 7.8 % — ABNORMAL HIGH (ref 4.8–5.6)

## 2018-05-25 LAB — SPECIMEN STATUS REPORT

## 2018-05-31 ENCOUNTER — Ambulatory Visit (INDEPENDENT_AMBULATORY_CARE_PROVIDER_SITE_OTHER): Payer: Medicare Other | Admitting: "Endocrinology

## 2018-05-31 ENCOUNTER — Encounter: Payer: Self-pay | Admitting: "Endocrinology

## 2018-05-31 VITALS — BP 134/79 | HR 91 | Ht 61.0 in | Wt 146.0 lb

## 2018-05-31 DIAGNOSIS — E782 Mixed hyperlipidemia: Secondary | ICD-10-CM | POA: Diagnosis not present

## 2018-05-31 DIAGNOSIS — I255 Ischemic cardiomyopathy: Secondary | ICD-10-CM | POA: Diagnosis not present

## 2018-05-31 DIAGNOSIS — N183 Chronic kidney disease, stage 3 (moderate): Secondary | ICD-10-CM | POA: Diagnosis not present

## 2018-05-31 DIAGNOSIS — Z794 Long term (current) use of insulin: Secondary | ICD-10-CM

## 2018-05-31 DIAGNOSIS — E1122 Type 2 diabetes mellitus with diabetic chronic kidney disease: Secondary | ICD-10-CM

## 2018-05-31 DIAGNOSIS — E1165 Type 2 diabetes mellitus with hyperglycemia: Secondary | ICD-10-CM

## 2018-05-31 DIAGNOSIS — I10 Essential (primary) hypertension: Secondary | ICD-10-CM

## 2018-05-31 DIAGNOSIS — IMO0002 Reserved for concepts with insufficient information to code with codable children: Secondary | ICD-10-CM

## 2018-05-31 MED ORDER — FREESTYLE LIBRE 14 DAY SENSOR MISC: 1.0000 | 2 refills | Status: DC

## 2018-05-31 MED ORDER — FREESTYLE LIBRE 14 DAY READER DEVI: 1.0000 | Freq: Once | 0 refills | Status: AC

## 2018-05-31 NOTE — Patient Instructions (Signed)
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
## 2018-06-14 LAB — CUP PACEART REMOTE DEVICE CHECK
Battery Voltage: 2.91 V
Brady Statistic RV Percent Paced: 24.1 %
Date Time Interrogation Session: 20190910083723
HighPow Impedance: 71 Ohm
Implantable Lead Implant Date: 20060214
Implantable Lead Implant Date: 20150406
Implantable Lead Location: 753858
Implantable Lead Location: 753860
Lead Channel Impedance Value: 342 Ohm
Lead Channel Impedance Value: 418 Ohm
Lead Channel Impedance Value: 418 Ohm
Lead Channel Pacing Threshold Amplitude: 1.625 V
Lead Channel Pacing Threshold Pulse Width: 0.4 ms
Lead Channel Pacing Threshold Pulse Width: 0.4 ms
Lead Channel Pacing Threshold Pulse Width: 0.6 ms
Lead Channel Sensing Intrinsic Amplitude: 17.375 mV
Lead Channel Sensing Intrinsic Amplitude: 17.375 mV
Lead Channel Sensing Intrinsic Amplitude: 3 mV
Lead Channel Setting Pacing Amplitude: 2 V
Lead Channel Setting Pacing Amplitude: 2.5 V
Lead Channel Setting Pacing Amplitude: 2.75 V
Lead Channel Setting Pacing Pulse Width: 0.4 ms
Lead Channel Setting Pacing Pulse Width: 0.6 ms
MDC IDC LEAD IMPLANT DT: 20150406
MDC IDC LEAD LOCATION: 753859
MDC IDC MSMT BATTERY REMAINING LONGEVITY: 18 mo
MDC IDC MSMT LEADCHNL LV IMPEDANCE VALUE: 532 Ohm
MDC IDC MSMT LEADCHNL LV IMPEDANCE VALUE: 817 Ohm
MDC IDC MSMT LEADCHNL RA IMPEDANCE VALUE: 646 Ohm
MDC IDC MSMT LEADCHNL RA PACING THRESHOLD AMPLITUDE: 0.875 V
MDC IDC MSMT LEADCHNL RA SENSING INTR AMPL: 3 mV
MDC IDC MSMT LEADCHNL RV PACING THRESHOLD AMPLITUDE: 1 V
MDC IDC PG IMPLANT DT: 20150406
MDC IDC SET LEADCHNL RV SENSING SENSITIVITY: 0.3 mV
MDC IDC STAT BRADY AP VP PERCENT: 0.02 %
MDC IDC STAT BRADY AP VS PERCENT: 0.01 %
MDC IDC STAT BRADY AS VP PERCENT: 98.49 %
MDC IDC STAT BRADY AS VS PERCENT: 1.47 %
MDC IDC STAT BRADY RA PERCENT PACED: 0.03 %

## 2018-06-17 ENCOUNTER — Other Ambulatory Visit: Payer: Self-pay

## 2018-06-17 MED ORDER — FREESTYLE LIBRE 14 DAY SENSOR MISC: 1.0000 | 2 refills | Status: DC

## 2018-06-22 DIAGNOSIS — R809 Proteinuria, unspecified: Secondary | ICD-10-CM | POA: Diagnosis not present

## 2018-06-22 DIAGNOSIS — E559 Vitamin D deficiency, unspecified: Secondary | ICD-10-CM | POA: Diagnosis not present

## 2018-06-22 DIAGNOSIS — N2581 Secondary hyperparathyroidism of renal origin: Secondary | ICD-10-CM | POA: Diagnosis not present

## 2018-06-22 DIAGNOSIS — N183 Chronic kidney disease, stage 3 (moderate): Secondary | ICD-10-CM | POA: Diagnosis not present

## 2018-06-29 ENCOUNTER — Other Ambulatory Visit: Payer: Self-pay | Admitting: "Endocrinology

## 2018-07-04 DIAGNOSIS — Z23 Encounter for immunization: Secondary | ICD-10-CM | POA: Diagnosis not present

## 2018-07-12 ENCOUNTER — Ambulatory Visit (INDEPENDENT_AMBULATORY_CARE_PROVIDER_SITE_OTHER): Payer: Medicare Other | Admitting: Urology

## 2018-07-12 DIAGNOSIS — N312 Flaccid neuropathic bladder, not elsewhere classified: Secondary | ICD-10-CM

## 2018-07-19 ENCOUNTER — Telehealth: Payer: Self-pay | Admitting: "Endocrinology

## 2018-07-19 NOTE — Telephone Encounter (Signed)
Pt notified to put another sensor on but to make sure she cleans the area well and has no lotion on skin in that area.

## 2018-07-19 NOTE — Telephone Encounter (Signed)
[name] is calling stating that her [name] Sensor only stayed on for 7 days and fell off, she is asking what can she do now, please advise?

## 2018-08-02 ENCOUNTER — Other Ambulatory Visit: Payer: Self-pay

## 2018-08-02 ENCOUNTER — Other Ambulatory Visit: Payer: Self-pay | Admitting: Internal Medicine

## 2018-08-02 MED ORDER — INSULIN PEN NEEDLE 32G X 4 MM MISC: 1.0000 | Freq: Four times a day (QID) | 2 refills | Status: DC

## 2018-08-03 DIAGNOSIS — I10 Essential (primary) hypertension: Secondary | ICD-10-CM | POA: Diagnosis not present

## 2018-08-03 DIAGNOSIS — N183 Chronic kidney disease, stage 3 (moderate): Secondary | ICD-10-CM | POA: Diagnosis not present

## 2018-08-03 DIAGNOSIS — E1121 Type 2 diabetes mellitus with diabetic nephropathy: Secondary | ICD-10-CM | POA: Diagnosis not present

## 2018-08-03 DIAGNOSIS — I420 Dilated cardiomyopathy: Secondary | ICD-10-CM | POA: Diagnosis not present

## 2018-08-23 ENCOUNTER — Ambulatory Visit (INDEPENDENT_AMBULATORY_CARE_PROVIDER_SITE_OTHER): Payer: Medicare Other

## 2018-08-23 DIAGNOSIS — I255 Ischemic cardiomyopathy: Secondary | ICD-10-CM

## 2018-08-23 DIAGNOSIS — I5042 Chronic combined systolic (congestive) and diastolic (congestive) heart failure: Secondary | ICD-10-CM

## 2018-08-25 ENCOUNTER — Other Ambulatory Visit: Payer: Self-pay | Admitting: "Endocrinology

## 2018-08-25 DIAGNOSIS — N183 Chronic kidney disease, stage 3 (moderate): Secondary | ICD-10-CM | POA: Diagnosis not present

## 2018-08-25 DIAGNOSIS — E1165 Type 2 diabetes mellitus with hyperglycemia: Secondary | ICD-10-CM | POA: Diagnosis not present

## 2018-08-25 DIAGNOSIS — E1122 Type 2 diabetes mellitus with diabetic chronic kidney disease: Secondary | ICD-10-CM | POA: Diagnosis not present

## 2018-08-25 DIAGNOSIS — Z794 Long term (current) use of insulin: Secondary | ICD-10-CM | POA: Diagnosis not present

## 2018-08-25 NOTE — Progress Notes (Signed)
Remote ICD transmission.   

## 2018-08-26 LAB — HGB A1C W/O EAG: Hgb A1c MFr Bld: 8.2 % — ABNORMAL HIGH (ref 4.8–5.6)

## 2018-08-26 LAB — COMPREHENSIVE METABOLIC PANEL
ALT: 29 IU/L (ref 0–32)
AST: 26 IU/L (ref 0–40)
Albumin/Globulin Ratio: 1.9 (ref 1.2–2.2)
Albumin: 4.6 g/dL (ref 3.6–4.8)
Alkaline Phosphatase: 103 IU/L (ref 39–117)
BUN/Creatinine Ratio: 19 (ref 12–28)
BUN: 35 mg/dL — ABNORMAL HIGH (ref 8–27)
Bilirubin Total: <0.2 mg/dL (ref 0.0–1.2)
CO2: 24 mmol/L (ref 20–29)
CREATININE: 1.8 mg/dL — AB (ref 0.57–1.00)
Calcium: 9.8 mg/dL (ref 8.7–10.3)
Chloride: 98 mmol/L (ref 96–106)
GFR calc Af Amer: 34 mL/min/{1.73_m2} — ABNORMAL LOW (ref >59)
GFR calc non Af Amer: 29 mL/min/{1.73_m2} — ABNORMAL LOW (ref >59)
Globulin, Total: 2.4 g/dL (ref 1.5–4.5)
Glucose: 191 mg/dL — ABNORMAL HIGH (ref 65–99)
Potassium: 4.9 mmol/L (ref 3.5–5.2)
Sodium: 139 mmol/L (ref 134–144)
TOTAL PROTEIN: 7 g/dL (ref 6.0–8.5)

## 2018-09-01 ENCOUNTER — Encounter: Payer: Self-pay | Admitting: "Endocrinology

## 2018-09-01 ENCOUNTER — Ambulatory Visit (INDEPENDENT_AMBULATORY_CARE_PROVIDER_SITE_OTHER): Payer: Medicare Other | Admitting: "Endocrinology

## 2018-09-01 ENCOUNTER — Other Ambulatory Visit: Payer: Self-pay | Admitting: "Endocrinology

## 2018-09-01 VITALS — BP 137/79 | HR 99 | Ht 61.0 in | Wt 149.0 lb

## 2018-09-01 DIAGNOSIS — E1122 Type 2 diabetes mellitus with diabetic chronic kidney disease: Secondary | ICD-10-CM | POA: Diagnosis not present

## 2018-09-01 DIAGNOSIS — E782 Mixed hyperlipidemia: Secondary | ICD-10-CM

## 2018-09-01 DIAGNOSIS — I10 Essential (primary) hypertension: Secondary | ICD-10-CM | POA: Diagnosis not present

## 2018-09-01 DIAGNOSIS — I255 Ischemic cardiomyopathy: Secondary | ICD-10-CM

## 2018-09-01 DIAGNOSIS — Z794 Long term (current) use of insulin: Secondary | ICD-10-CM | POA: Diagnosis not present

## 2018-09-01 DIAGNOSIS — N184 Chronic kidney disease, stage 4 (severe): Secondary | ICD-10-CM | POA: Diagnosis not present

## 2018-09-01 NOTE — Patient Instructions (Signed)
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
## 2018-09-21 ENCOUNTER — Other Ambulatory Visit: Payer: Self-pay | Admitting: "Endocrinology

## 2018-10-02 LAB — CUP PACEART REMOTE DEVICE CHECK
Battery Remaining Longevity: 14 mo
Battery Voltage: 2.89 V
Brady Statistic AP VP Percent: 0.03 %
Brady Statistic AP VS Percent: 0.01 %
Brady Statistic AS VP Percent: 98.14 %
Brady Statistic AS VS Percent: 1.82 %
Brady Statistic RA Percent Paced: 0.04 %
Brady Statistic RV Percent Paced: 21.74 %
Date Time Interrogation Session: 20191210083523
HighPow Impedance: 78 Ohm
Implantable Lead Implant Date: 20060214
Implantable Lead Implant Date: 20150406
Implantable Lead Location: 753859
Implantable Lead Location: 753860
Implantable Lead Model: 4396
Implantable Lead Model: 5076
Implantable Pulse Generator Implant Date: 20150406
Lead Channel Impedance Value: 342 Ohm
Lead Channel Impedance Value: 418 Ohm
Lead Channel Impedance Value: 513 Ohm
Lead Channel Impedance Value: 646 Ohm
Lead Channel Impedance Value: 817 Ohm
Lead Channel Pacing Threshold Amplitude: 0.75 V
Lead Channel Pacing Threshold Amplitude: 1 V
Lead Channel Pacing Threshold Amplitude: 1.375 V
Lead Channel Pacing Threshold Pulse Width: 0.4 ms
Lead Channel Pacing Threshold Pulse Width: 0.4 ms
Lead Channel Pacing Threshold Pulse Width: 0.6 ms
Lead Channel Sensing Intrinsic Amplitude: 3.625 mV
Lead Channel Sensing Intrinsic Amplitude: 31.125 mV
Lead Channel Setting Pacing Amplitude: 2 V
Lead Channel Setting Pacing Amplitude: 2.5 V
Lead Channel Setting Pacing Amplitude: 2.5 V
Lead Channel Setting Pacing Pulse Width: 0.4 ms
Lead Channel Setting Pacing Pulse Width: 0.6 ms
Lead Channel Setting Sensing Sensitivity: 0.3 mV
MDC IDC LEAD IMPLANT DT: 20150406
MDC IDC LEAD LOCATION: 753858
MDC IDC MSMT LEADCHNL RA SENSING INTR AMPL: 3.625 mV
MDC IDC MSMT LEADCHNL RV IMPEDANCE VALUE: 456 Ohm
MDC IDC MSMT LEADCHNL RV SENSING INTR AMPL: 31.125 mV

## 2018-11-03 DIAGNOSIS — N390 Urinary tract infection, site not specified: Secondary | ICD-10-CM | POA: Diagnosis not present

## 2018-11-03 DIAGNOSIS — N183 Chronic kidney disease, stage 3 (moderate): Secondary | ICD-10-CM | POA: Diagnosis not present

## 2018-11-03 DIAGNOSIS — I129 Hypertensive chronic kidney disease with stage 1 through stage 4 chronic kidney disease, or unspecified chronic kidney disease: Secondary | ICD-10-CM | POA: Diagnosis not present

## 2018-11-03 DIAGNOSIS — E1121 Type 2 diabetes mellitus with diabetic nephropathy: Secondary | ICD-10-CM | POA: Diagnosis not present

## 2018-11-11 DIAGNOSIS — E785 Hyperlipidemia, unspecified: Secondary | ICD-10-CM | POA: Diagnosis not present

## 2018-11-11 DIAGNOSIS — E0821 Diabetes mellitus due to underlying condition with diabetic nephropathy: Secondary | ICD-10-CM | POA: Diagnosis not present

## 2018-11-11 DIAGNOSIS — Z6828 Body mass index (BMI) 28.0-28.9, adult: Secondary | ICD-10-CM | POA: Diagnosis not present

## 2018-11-11 DIAGNOSIS — E118 Type 2 diabetes mellitus with unspecified complications: Secondary | ICD-10-CM | POA: Diagnosis not present

## 2018-11-11 DIAGNOSIS — I429 Cardiomyopathy, unspecified: Secondary | ICD-10-CM | POA: Diagnosis not present

## 2018-11-11 DIAGNOSIS — N183 Chronic kidney disease, stage 3 (moderate): Secondary | ICD-10-CM | POA: Diagnosis not present

## 2018-11-11 DIAGNOSIS — N390 Urinary tract infection, site not specified: Secondary | ICD-10-CM | POA: Diagnosis not present

## 2018-11-11 DIAGNOSIS — R102 Pelvic and perineal pain: Secondary | ICD-10-CM | POA: Diagnosis not present

## 2018-11-11 DIAGNOSIS — E1140 Type 2 diabetes mellitus with diabetic neuropathy, unspecified: Secondary | ICD-10-CM | POA: Diagnosis not present

## 2018-11-22 ENCOUNTER — Ambulatory Visit (INDEPENDENT_AMBULATORY_CARE_PROVIDER_SITE_OTHER): Payer: Medicare Other | Admitting: *Deleted

## 2018-11-22 DIAGNOSIS — I255 Ischemic cardiomyopathy: Secondary | ICD-10-CM

## 2018-11-23 LAB — CUP PACEART REMOTE DEVICE CHECK
Battery Remaining Longevity: 13 mo
Battery Voltage: 2.87 V
Brady Statistic AP VP Percent: 0.03 %
Brady Statistic AP VS Percent: 0.02 %
Brady Statistic AS VP Percent: 97.81 %
Brady Statistic AS VS Percent: 2.14 %
Brady Statistic RA Percent Paced: 0.04 %
Date Time Interrogation Session: 20200310084226
HighPow Impedance: 69 Ohm
Implantable Lead Implant Date: 20150406
Implantable Lead Implant Date: 20150406
Implantable Lead Location: 753858
Implantable Lead Location: 753860
Implantable Lead Model: 4396
Implantable Lead Model: 5076
Implantable Pulse Generator Implant Date: 20150406
Lead Channel Impedance Value: 342 Ohm
Lead Channel Impedance Value: 399 Ohm
Lead Channel Impedance Value: 418 Ohm
Lead Channel Impedance Value: 456 Ohm
Lead Channel Impedance Value: 646 Ohm
Lead Channel Impedance Value: 760 Ohm
Lead Channel Pacing Threshold Amplitude: 0.875 V
Lead Channel Pacing Threshold Amplitude: 1 V
Lead Channel Pacing Threshold Amplitude: 1.5 V
Lead Channel Pacing Threshold Pulse Width: 0.4 ms
Lead Channel Pacing Threshold Pulse Width: 0.4 ms
Lead Channel Pacing Threshold Pulse Width: 0.6 ms
Lead Channel Sensing Intrinsic Amplitude: 24 mV
Lead Channel Sensing Intrinsic Amplitude: 24 mV
Lead Channel Sensing Intrinsic Amplitude: 3.25 mV
Lead Channel Setting Pacing Amplitude: 2 V
Lead Channel Setting Pacing Amplitude: 2.5 V
Lead Channel Setting Pacing Amplitude: 2.5 V
Lead Channel Setting Pacing Pulse Width: 0.4 ms
Lead Channel Setting Pacing Pulse Width: 0.6 ms
Lead Channel Setting Sensing Sensitivity: 0.3 mV
MDC IDC LEAD IMPLANT DT: 20060214
MDC IDC LEAD LOCATION: 753859
MDC IDC MSMT LEADCHNL RA SENSING INTR AMPL: 3.25 mV
MDC IDC STAT BRADY RV PERCENT PACED: 24.98 %

## 2018-11-28 ENCOUNTER — Other Ambulatory Visit: Payer: Self-pay | Admitting: Internal Medicine

## 2018-11-30 ENCOUNTER — Encounter: Payer: Self-pay | Admitting: Cardiology

## 2018-11-30 NOTE — Progress Notes (Signed)
Remote ICD transmission.   

## 2018-12-02 ENCOUNTER — Ambulatory Visit: Payer: Medicare Other | Admitting: "Endocrinology

## 2018-12-07 DIAGNOSIS — Z6828 Body mass index (BMI) 28.0-28.9, adult: Secondary | ICD-10-CM | POA: Diagnosis not present

## 2018-12-07 DIAGNOSIS — I429 Cardiomyopathy, unspecified: Secondary | ICD-10-CM | POA: Diagnosis not present

## 2018-12-07 DIAGNOSIS — E1140 Type 2 diabetes mellitus with diabetic neuropathy, unspecified: Secondary | ICD-10-CM | POA: Diagnosis not present

## 2018-12-07 DIAGNOSIS — N183 Chronic kidney disease, stage 3 (moderate): Secondary | ICD-10-CM | POA: Diagnosis not present

## 2018-12-07 DIAGNOSIS — E118 Type 2 diabetes mellitus with unspecified complications: Secondary | ICD-10-CM | POA: Diagnosis not present

## 2018-12-07 DIAGNOSIS — E0821 Diabetes mellitus due to underlying condition with diabetic nephropathy: Secondary | ICD-10-CM | POA: Diagnosis not present

## 2018-12-07 DIAGNOSIS — E785 Hyperlipidemia, unspecified: Secondary | ICD-10-CM | POA: Diagnosis not present

## 2018-12-15 ENCOUNTER — Other Ambulatory Visit: Payer: Self-pay | Admitting: "Endocrinology

## 2018-12-19 DIAGNOSIS — F419 Anxiety disorder, unspecified: Secondary | ICD-10-CM | POA: Diagnosis not present

## 2018-12-19 DIAGNOSIS — E119 Type 2 diabetes mellitus without complications: Secondary | ICD-10-CM | POA: Diagnosis not present

## 2018-12-19 DIAGNOSIS — M545 Low back pain: Secondary | ICD-10-CM | POA: Diagnosis not present

## 2018-12-19 DIAGNOSIS — K210 Gastro-esophageal reflux disease with esophagitis: Secondary | ICD-10-CM | POA: Diagnosis not present

## 2018-12-29 ENCOUNTER — Other Ambulatory Visit: Payer: Self-pay | Admitting: Internal Medicine

## 2019-01-04 DIAGNOSIS — N183 Chronic kidney disease, stage 3 (moderate): Secondary | ICD-10-CM | POA: Diagnosis not present

## 2019-01-04 DIAGNOSIS — E1140 Type 2 diabetes mellitus with diabetic neuropathy, unspecified: Secondary | ICD-10-CM | POA: Diagnosis not present

## 2019-01-04 DIAGNOSIS — I429 Cardiomyopathy, unspecified: Secondary | ICD-10-CM | POA: Diagnosis not present

## 2019-01-04 DIAGNOSIS — E0821 Diabetes mellitus due to underlying condition with diabetic nephropathy: Secondary | ICD-10-CM | POA: Diagnosis not present

## 2019-01-04 DIAGNOSIS — Z6828 Body mass index (BMI) 28.0-28.9, adult: Secondary | ICD-10-CM | POA: Diagnosis not present

## 2019-01-04 DIAGNOSIS — E118 Type 2 diabetes mellitus with unspecified complications: Secondary | ICD-10-CM | POA: Diagnosis not present

## 2019-01-04 DIAGNOSIS — E785 Hyperlipidemia, unspecified: Secondary | ICD-10-CM | POA: Diagnosis not present

## 2019-01-05 ENCOUNTER — Telehealth: Payer: Self-pay

## 2019-01-05 DIAGNOSIS — N184 Chronic kidney disease, stage 4 (severe): Principal | ICD-10-CM

## 2019-01-05 DIAGNOSIS — Z794 Long term (current) use of insulin: Principal | ICD-10-CM

## 2019-01-05 DIAGNOSIS — E1122 Type 2 diabetes mellitus with diabetic chronic kidney disease: Secondary | ICD-10-CM

## 2019-01-05 MED ORDER — FREESTYLE LIBRE 14 DAY SENSOR MISC: 1.0000 | 2 refills | Status: DC

## 2019-01-05 NOTE — Telephone Encounter (Signed)
LeighAnn Hailei Besser, CMA  

## 2019-01-06 DIAGNOSIS — I959 Hypotension, unspecified: Secondary | ICD-10-CM | POA: Diagnosis not present

## 2019-01-06 DIAGNOSIS — I509 Heart failure, unspecified: Secondary | ICD-10-CM | POA: Diagnosis not present

## 2019-01-09 ENCOUNTER — Telehealth: Payer: Self-pay | Admitting: Internal Medicine

## 2019-01-09 NOTE — Telephone Encounter (Signed)
Pt has been having dizzy spells for a few days and states her BP is low. Please give pt a call @ [phone]

## 2019-01-09 NOTE — Telephone Encounter (Signed)
Returned pt call. She states that for the past two weeks she has been getting dizzy. She has taken her blood pressure several time and it has been low. ( 86/49, 95/66, 94/60) her heart rate has been staying in upper 80's to 90's. She has not had any CP, SOB, or passing out. She is taking her medications as prescribed. She states she drinks plenty of water to stay hydrated. Please advise.

## 2019-01-10 DIAGNOSIS — I10 Essential (primary) hypertension: Secondary | ICD-10-CM | POA: Diagnosis not present

## 2019-01-10 DIAGNOSIS — I420 Dilated cardiomyopathy: Secondary | ICD-10-CM | POA: Diagnosis not present

## 2019-01-10 NOTE — Telephone Encounter (Signed)
Reduce losartan to 50 mg daily.

## 2019-01-10 NOTE — Telephone Encounter (Signed)
Called to notify pt to reduce Losartan to 50 mg Daily. Pt states that she has been taking 50 mg daily and is still having decreased BP and dizziness. Please advise.

## 2019-01-13 DIAGNOSIS — I10 Essential (primary) hypertension: Secondary | ICD-10-CM | POA: Diagnosis not present

## 2019-01-13 DIAGNOSIS — I509 Heart failure, unspecified: Secondary | ICD-10-CM | POA: Diagnosis not present

## 2019-01-13 DIAGNOSIS — M545 Low back pain: Secondary | ICD-10-CM | POA: Diagnosis not present

## 2019-01-18 DIAGNOSIS — Z79899 Other long term (current) drug therapy: Secondary | ICD-10-CM | POA: Diagnosis not present

## 2019-01-18 DIAGNOSIS — E118 Type 2 diabetes mellitus with unspecified complications: Secondary | ICD-10-CM | POA: Diagnosis not present

## 2019-01-18 DIAGNOSIS — E1140 Type 2 diabetes mellitus with diabetic neuropathy, unspecified: Secondary | ICD-10-CM | POA: Diagnosis not present

## 2019-01-18 DIAGNOSIS — Z7189 Other specified counseling: Secondary | ICD-10-CM | POA: Diagnosis not present

## 2019-01-18 DIAGNOSIS — Z6828 Body mass index (BMI) 28.0-28.9, adult: Secondary | ICD-10-CM | POA: Diagnosis not present

## 2019-01-18 DIAGNOSIS — N183 Chronic kidney disease, stage 3 (moderate): Secondary | ICD-10-CM | POA: Diagnosis not present

## 2019-01-18 DIAGNOSIS — I429 Cardiomyopathy, unspecified: Secondary | ICD-10-CM | POA: Diagnosis not present

## 2019-01-18 DIAGNOSIS — N184 Chronic kidney disease, stage 4 (severe): Secondary | ICD-10-CM | POA: Diagnosis not present

## 2019-01-18 DIAGNOSIS — E0821 Diabetes mellitus due to underlying condition with diabetic nephropathy: Secondary | ICD-10-CM | POA: Diagnosis not present

## 2019-01-18 DIAGNOSIS — R945 Abnormal results of liver function studies: Secondary | ICD-10-CM | POA: Diagnosis not present

## 2019-01-18 DIAGNOSIS — E785 Hyperlipidemia, unspecified: Secondary | ICD-10-CM | POA: Diagnosis not present

## 2019-01-24 ENCOUNTER — Other Ambulatory Visit (HOSPITAL_COMMUNITY): Payer: Self-pay | Admitting: Pulmonary Disease

## 2019-01-24 DIAGNOSIS — Z1231 Encounter for screening mammogram for malignant neoplasm of breast: Secondary | ICD-10-CM

## 2019-02-01 ENCOUNTER — Encounter (HOSPITAL_COMMUNITY): Payer: Self-pay

## 2019-02-01 ENCOUNTER — Ambulatory Visit (HOSPITAL_COMMUNITY)
Admission: RE | Admit: 2019-02-01 | Discharge: 2019-02-01 | Disposition: A | Discharge status: Home / Self Care | Payer: Medicare Other | Source: Ambulatory Visit | Attending: Pulmonary Disease | Admitting: Pulmonary Disease

## 2019-02-01 ENCOUNTER — Other Ambulatory Visit: Payer: Self-pay

## 2019-02-01 DIAGNOSIS — Z1231 Encounter for screening mammogram for malignant neoplasm of breast: Secondary | ICD-10-CM | POA: Insufficient documentation

## 2019-02-02 ENCOUNTER — Other Ambulatory Visit (HOSPITAL_COMMUNITY): Payer: Self-pay | Admitting: Pulmonary Disease

## 2019-02-02 DIAGNOSIS — R928 Other abnormal and inconclusive findings on diagnostic imaging of breast: Secondary | ICD-10-CM

## 2019-02-07 ENCOUNTER — Other Ambulatory Visit: Payer: Self-pay

## 2019-02-07 ENCOUNTER — Ambulatory Visit (HOSPITAL_COMMUNITY)
Admission: RE | Admit: 2019-02-07 | Discharge: 2019-02-07 | Disposition: A | Discharge status: Home / Self Care | Payer: Medicare Other | Source: Ambulatory Visit | Attending: Pulmonary Disease | Admitting: Pulmonary Disease

## 2019-02-07 DIAGNOSIS — R928 Other abnormal and inconclusive findings on diagnostic imaging of breast: Secondary | ICD-10-CM

## 2019-02-20 ENCOUNTER — Telehealth: Payer: Self-pay | Admitting: Internal Medicine

## 2019-02-20 NOTE — Telephone Encounter (Signed)
Patient called to stated her machine to check defib is not working

## 2019-02-20 NOTE — Telephone Encounter (Signed)
LMOVM with my direct office number.

## 2019-02-21 ENCOUNTER — Encounter: Payer: Medicare Other | Admitting: *Deleted

## 2019-02-21 NOTE — Telephone Encounter (Signed)
Pt states that [employer] is sending her a new monitor. It will take 7-10- business days to get to her.

## 2019-02-22 ENCOUNTER — Telehealth: Payer: Self-pay

## 2019-02-22 NOTE — Telephone Encounter (Signed)
Left message for patient to remind of missed remote transmission.  

## 2019-02-23 ENCOUNTER — Ambulatory Visit (INDEPENDENT_AMBULATORY_CARE_PROVIDER_SITE_OTHER): Payer: Medicare Other | Admitting: *Deleted

## 2019-02-23 ENCOUNTER — Telehealth: Payer: Self-pay | Admitting: Student

## 2019-02-23 DIAGNOSIS — I255 Ischemic cardiomyopathy: Secondary | ICD-10-CM | POA: Diagnosis not present

## 2019-02-23 LAB — CUP PACEART REMOTE DEVICE CHECK
Battery Remaining Longevity: 9 mo
Battery Voltage: 2.84 V
Brady Statistic AP VP Percent: 0.03 %
Brady Statistic AP VS Percent: 0.01 %
Brady Statistic AS VP Percent: 98.19 %
Brady Statistic AS VS Percent: 1.77 %
Brady Statistic RA Percent Paced: 0.04 %
Brady Statistic RV Percent Paced: 24.08 %
Date Time Interrogation Session: 20200611124326
HighPow Impedance: 71 Ohm
Implantable Lead Implant Date: 20060214
Implantable Lead Implant Date: 20150406
Implantable Lead Implant Date: 20150406
Implantable Lead Location: 753858
Implantable Lead Location: 753859
Implantable Lead Location: 753860
Implantable Lead Model: 4396
Implantable Lead Model: 5076
Implantable Pulse Generator Implant Date: 20150406
Lead Channel Impedance Value: 342 Ohm
Lead Channel Impedance Value: 361 Ohm
Lead Channel Impedance Value: 399 Ohm
Lead Channel Impedance Value: 475 Ohm
Lead Channel Impedance Value: 646 Ohm
Lead Channel Impedance Value: 722 Ohm
Lead Channel Pacing Threshold Amplitude: 0.875 V
Lead Channel Pacing Threshold Amplitude: 1 V
Lead Channel Pacing Threshold Amplitude: 1.75 V
Lead Channel Pacing Threshold Pulse Width: 0.4 ms
Lead Channel Pacing Threshold Pulse Width: 0.4 ms
Lead Channel Pacing Threshold Pulse Width: 0.6 ms
Lead Channel Sensing Intrinsic Amplitude: 15.125 mV
Lead Channel Sensing Intrinsic Amplitude: 2.875 mV
Lead Channel Setting Pacing Amplitude: 2 V
Lead Channel Setting Pacing Amplitude: 2.5 V
Lead Channel Setting Pacing Amplitude: 2.75 V
Lead Channel Setting Pacing Pulse Width: 0.4 ms
Lead Channel Setting Pacing Pulse Width: 0.6 ms
Lead Channel Setting Sensing Sensitivity: 0.3 mV

## 2019-02-23 NOTE — Telephone Encounter (Signed)
Pt new monitor came and she sent a transmission. Transmission received 02-23-2019

## 2019-02-23 NOTE — Telephone Encounter (Signed)
Called to let patient know device estimates 9 mo until ERI. Will proceed with monthly battery checks. All questions answered.    Legrand Como [address]" [place], PA-C 02/23/2019 1:42 PM

## 2019-03-02 ENCOUNTER — Encounter: Payer: Self-pay | Admitting: Cardiology

## 2019-03-02 NOTE — Progress Notes (Signed)
Remote ICD transmission.   

## 2019-03-10 ENCOUNTER — Telehealth: Payer: Self-pay | Admitting: Internal Medicine

## 2019-03-10 ENCOUNTER — Other Ambulatory Visit: Payer: Self-pay | Admitting: Internal Medicine

## 2019-03-10 ENCOUNTER — Other Ambulatory Visit: Payer: Medicare Other

## 2019-03-10 DIAGNOSIS — Z20822 Contact with and (suspected) exposure to covid-19: Secondary | ICD-10-CM

## 2019-03-10 DIAGNOSIS — R6889 Other general symptoms and signs: Secondary | ICD-10-CM | POA: Diagnosis not present

## 2019-03-10 NOTE — Telephone Encounter (Signed)
I advise her she needs to be seen probably in the ED as it is late Friday evening now.She says sx's started four days ago and she is still ataxic, dizzy and has numb sensation in legs

## 2019-03-10 NOTE — Telephone Encounter (Signed)
New Message           Patient is calling in today to let Dr. Lovena Le know that she is dizzy and wobble, can't walk straight and legs feel numb. Pls advise.

## 2019-03-14 ENCOUNTER — Other Ambulatory Visit (HOSPITAL_COMMUNITY): Payer: Self-pay | Admitting: Pulmonary Disease

## 2019-03-14 DIAGNOSIS — R42 Dizziness and giddiness: Secondary | ICD-10-CM

## 2019-03-14 DIAGNOSIS — R55 Syncope and collapse: Secondary | ICD-10-CM

## 2019-03-15 ENCOUNTER — Other Ambulatory Visit: Payer: Self-pay

## 2019-03-15 ENCOUNTER — Ambulatory Visit (HOSPITAL_COMMUNITY)
Admission: RE | Admit: 2019-03-15 | Discharge: 2019-03-15 | Disposition: A | Discharge status: Home / Self Care | Payer: Medicare Other | Source: Ambulatory Visit | Attending: Pulmonary Disease | Admitting: Pulmonary Disease

## 2019-03-15 DIAGNOSIS — R42 Dizziness and giddiness: Secondary | ICD-10-CM | POA: Diagnosis not present

## 2019-03-15 DIAGNOSIS — E1165 Type 2 diabetes mellitus with hyperglycemia: Secondary | ICD-10-CM | POA: Diagnosis not present

## 2019-03-15 DIAGNOSIS — R55 Syncope and collapse: Secondary | ICD-10-CM | POA: Insufficient documentation

## 2019-03-16 ENCOUNTER — Encounter: Payer: Medicare Other | Admitting: Internal Medicine

## 2019-03-16 LAB — NOVEL CORONAVIRUS, NAA: SARS-CoV-2, NAA: NOT DETECTED

## 2019-03-24 DIAGNOSIS — E119 Type 2 diabetes mellitus without complications: Secondary | ICD-10-CM | POA: Diagnosis not present

## 2019-03-27 ENCOUNTER — Ambulatory Visit (INDEPENDENT_AMBULATORY_CARE_PROVIDER_SITE_OTHER): Payer: Medicare Other | Admitting: *Deleted

## 2019-03-27 DIAGNOSIS — I639 Cerebral infarction, unspecified: Secondary | ICD-10-CM

## 2019-03-27 DIAGNOSIS — I5022 Chronic systolic (congestive) heart failure: Secondary | ICD-10-CM

## 2019-03-27 LAB — CUP PACEART REMOTE DEVICE CHECK
Battery Remaining Longevity: 8 mo
Battery Voltage: 2.85 V
Brady Statistic AP VP Percent: 0.03 %
Brady Statistic AP VS Percent: 0.02 %
Brady Statistic AS VP Percent: 98.26 %
Brady Statistic AS VS Percent: 1.69 %
Brady Statistic RA Percent Paced: 0.05 %
Brady Statistic RV Percent Paced: 18.22 %
Date Time Interrogation Session: 20200713062722
HighPow Impedance: 73 Ohm
Implantable Lead Implant Date: 20060214
Implantable Lead Implant Date: 20150406
Implantable Lead Implant Date: 20150406
Implantable Lead Location: 753858
Implantable Lead Location: 753859
Implantable Lead Location: 753860
Implantable Lead Model: 4396
Implantable Lead Model: 5076
Implantable Pulse Generator Implant Date: 20150406
Lead Channel Impedance Value: 361 Ohm
Lead Channel Impedance Value: 418 Ohm
Lead Channel Impedance Value: 456 Ohm
Lead Channel Impedance Value: 532 Ohm
Lead Channel Impedance Value: 646 Ohm
Lead Channel Impedance Value: 836 Ohm
Lead Channel Pacing Threshold Amplitude: 0.875 V
Lead Channel Pacing Threshold Amplitude: 1 V
Lead Channel Pacing Threshold Amplitude: 1.875 V
Lead Channel Pacing Threshold Pulse Width: 0.4 ms
Lead Channel Pacing Threshold Pulse Width: 0.4 ms
Lead Channel Pacing Threshold Pulse Width: 0.6 ms
Lead Channel Sensing Intrinsic Amplitude: 17.5 mV
Lead Channel Sensing Intrinsic Amplitude: 17.5 mV
Lead Channel Sensing Intrinsic Amplitude: 2.875 mV
Lead Channel Sensing Intrinsic Amplitude: 2.875 mV
Lead Channel Setting Pacing Amplitude: 2 V
Lead Channel Setting Pacing Amplitude: 2.5 V
Lead Channel Setting Pacing Amplitude: 3.25 V
Lead Channel Setting Pacing Pulse Width: 0.4 ms
Lead Channel Setting Pacing Pulse Width: 0.6 ms
Lead Channel Setting Sensing Sensitivity: 0.3 mV

## 2019-04-05 ENCOUNTER — Encounter: Payer: Self-pay | Admitting: Internal Medicine

## 2019-04-05 ENCOUNTER — Ambulatory Visit (INDEPENDENT_AMBULATORY_CARE_PROVIDER_SITE_OTHER): Payer: Medicare Other | Admitting: Internal Medicine

## 2019-04-05 DIAGNOSIS — I255 Ischemic cardiomyopathy: Secondary | ICD-10-CM

## 2019-04-05 DIAGNOSIS — I5042 Chronic combined systolic (congestive) and diastolic (congestive) heart failure: Secondary | ICD-10-CM

## 2019-04-05 LAB — CUP PACEART INCLINIC DEVICE CHECK
Battery Remaining Longevity: 8 mo
Battery Voltage: 2.84 V
Brady Statistic AP VP Percent: 0.03 %
Brady Statistic AP VS Percent: 0.01 %
Brady Statistic AS VP Percent: 98.19 %
Brady Statistic AS VS Percent: 1.76 %
Brady Statistic RA Percent Paced: 0.05 %
Brady Statistic RV Percent Paced: 23.62 %
Date Time Interrogation Session: 20200722102607
HighPow Impedance: 67 Ohm
Implantable Lead Implant Date: 20060214
Implantable Lead Implant Date: 20150406
Implantable Lead Implant Date: 20150406
Implantable Lead Location: 753858
Implantable Lead Location: 753859
Implantable Lead Location: 753860
Implantable Lead Model: 4396
Implantable Lead Model: 5076
Implantable Pulse Generator Implant Date: 20150406
Lead Channel Impedance Value: 342 Ohm
Lead Channel Impedance Value: 399 Ohm
Lead Channel Impedance Value: 456 Ohm
Lead Channel Impedance Value: 456 Ohm
Lead Channel Impedance Value: 646 Ohm
Lead Channel Impedance Value: 703 Ohm
Lead Channel Pacing Threshold Amplitude: 0.875 V
Lead Channel Pacing Threshold Amplitude: 0.875 V
Lead Channel Pacing Threshold Amplitude: 1.875 V
Lead Channel Pacing Threshold Pulse Width: 0.4 ms
Lead Channel Pacing Threshold Pulse Width: 0.4 ms
Lead Channel Pacing Threshold Pulse Width: 0.6 ms
Lead Channel Sensing Intrinsic Amplitude: 15 mV
Lead Channel Sensing Intrinsic Amplitude: 16.125 mV
Lead Channel Sensing Intrinsic Amplitude: 2.75 mV
Lead Channel Sensing Intrinsic Amplitude: 2.875 mV
Lead Channel Setting Pacing Amplitude: 2 V
Lead Channel Setting Pacing Amplitude: 2.5 V
Lead Channel Setting Pacing Amplitude: 3 V
Lead Channel Setting Pacing Pulse Width: 0.4 ms
Lead Channel Setting Pacing Pulse Width: 0.6 ms
Lead Channel Setting Sensing Sensitivity: 0.3 mV

## 2019-04-05 NOTE — Patient Instructions (Signed)
Medication Instructions:  Your physician recommends that you continue on your current medications as directed. Please refer to the Current Medication list given to you today.  If you need a refill on your cardiac medications before your next appointment, please call your pharmacy.   Lab work: NONE   If you have labs (blood work) drawn today and your tests are completely normal, you will receive your results only by: [name] MyChart Message (if you have MyChart) OR . A paper copy in the mail If you have any lab test that is abnormal or we need to change your treatment, we will call you to review the results.  Testing/Procedures: NONE   Follow-Up: At [facility], you and your health needs are our priority.  As part of our continuing mission to provide you with exceptional heart care, we have created designated Provider Care Teams.  These Care Teams include your primary Cardiologist (physician) and Advanced Practice Providers (APPs -  Physician Assistants and Nurse Practitioners) who all work together to provide you with the care you need, when you need it. You will need a follow up appointment in 1 years.  Please call our office 2 months in advance to schedule this appointment.  You may see None or one of the following Advanced Practice Providers on your designated Care Team:   Chanetta Marshall, NP . Tommye Standard, PA-C  Any Other Special Instructions Will Be Listed Below (If Applicable). Thank you for choosing [facility]!
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
## 2019-04-10 NOTE — Progress Notes (Signed)
Remote ICD transmission.   

## 2019-04-10 NOTE — Addendum Note (Signed)
Addended by: Douglass Rivers D on: 04/10/2019 01:13 PM   Modules accepted: Level of Service

## 2019-04-20 DIAGNOSIS — N184 Chronic kidney disease, stage 4 (severe): Secondary | ICD-10-CM | POA: Diagnosis not present

## 2019-04-20 DIAGNOSIS — I429 Cardiomyopathy, unspecified: Secondary | ICD-10-CM | POA: Diagnosis not present

## 2019-04-20 DIAGNOSIS — E1140 Type 2 diabetes mellitus with diabetic neuropathy, unspecified: Secondary | ICD-10-CM | POA: Diagnosis not present

## 2019-04-20 DIAGNOSIS — R945 Abnormal results of liver function studies: Secondary | ICD-10-CM | POA: Diagnosis not present

## 2019-04-20 DIAGNOSIS — E118 Type 2 diabetes mellitus with unspecified complications: Secondary | ICD-10-CM | POA: Diagnosis not present

## 2019-04-20 DIAGNOSIS — E139 Other specified diabetes mellitus without complications: Secondary | ICD-10-CM | POA: Diagnosis not present

## 2019-04-20 DIAGNOSIS — Z7189 Other specified counseling: Secondary | ICD-10-CM | POA: Diagnosis not present

## 2019-04-20 DIAGNOSIS — E109 Type 1 diabetes mellitus without complications: Secondary | ICD-10-CM | POA: Diagnosis not present

## 2019-04-20 DIAGNOSIS — E0821 Diabetes mellitus due to underlying condition with diabetic nephropathy: Secondary | ICD-10-CM | POA: Diagnosis not present

## 2019-04-20 DIAGNOSIS — Z6823 Body mass index (BMI) 23.0-23.9, adult: Secondary | ICD-10-CM | POA: Diagnosis not present

## 2019-04-20 DIAGNOSIS — E785 Hyperlipidemia, unspecified: Secondary | ICD-10-CM | POA: Diagnosis not present

## 2019-04-26 ENCOUNTER — Ambulatory Visit (INDEPENDENT_AMBULATORY_CARE_PROVIDER_SITE_OTHER): Payer: Medicare Other | Admitting: *Deleted

## 2019-04-26 DIAGNOSIS — Z95810 Presence of automatic (implantable) cardiac defibrillator: Secondary | ICD-10-CM

## 2019-04-27 LAB — CUP PACEART REMOTE DEVICE CHECK
Battery Remaining Longevity: 7 mo
Battery Voltage: 2.84 V
Brady Statistic AP VP Percent: 0.05 %
Brady Statistic AP VS Percent: 0.02 %
Brady Statistic AS VP Percent: 98.35 %
Brady Statistic AS VS Percent: 1.58 %
Brady Statistic RA Percent Paced: 0.07 %
Brady Statistic RV Percent Paced: 18.36 %
Date Time Interrogation Session: 20200812073624
HighPow Impedance: 68 Ohm
Implantable Lead Implant Date: 20060214
Implantable Lead Implant Date: 20150406
Implantable Lead Implant Date: 20150406
Implantable Lead Location: 753858
Implantable Lead Location: 753859
Implantable Lead Location: 753860
Implantable Lead Model: 4396
Implantable Lead Model: 5076
Implantable Pulse Generator Implant Date: 20150406
Lead Channel Impedance Value: 304 Ohm
Lead Channel Impedance Value: 399 Ohm
Lead Channel Impedance Value: 418 Ohm
Lead Channel Impedance Value: 475 Ohm
Lead Channel Impedance Value: 646 Ohm
Lead Channel Impedance Value: 722 Ohm
Lead Channel Pacing Threshold Amplitude: 0.75 V
Lead Channel Pacing Threshold Amplitude: 0.875 V
Lead Channel Pacing Threshold Amplitude: 1.625 V
Lead Channel Pacing Threshold Pulse Width: 0.4 ms
Lead Channel Pacing Threshold Pulse Width: 0.4 ms
Lead Channel Pacing Threshold Pulse Width: 0.6 ms
Lead Channel Sensing Intrinsic Amplitude: 14.375 mV
Lead Channel Sensing Intrinsic Amplitude: 14.375 mV
Lead Channel Sensing Intrinsic Amplitude: 2.875 mV
Lead Channel Sensing Intrinsic Amplitude: 2.875 mV
Lead Channel Setting Pacing Amplitude: 2 V
Lead Channel Setting Pacing Amplitude: 2.5 V
Lead Channel Setting Pacing Amplitude: 3.25 V
Lead Channel Setting Pacing Pulse Width: 0.4 ms
Lead Channel Setting Pacing Pulse Width: 0.6 ms
Lead Channel Setting Sensing Sensitivity: 0.3 mV

## 2019-05-05 ENCOUNTER — Encounter: Payer: Self-pay | Admitting: Cardiology

## 2019-05-05 NOTE — Addendum Note (Signed)
Addended by: Tiajuana Amass on: 05/05/2019 01:52 PM   Modules accepted: Level of Service

## 2019-05-05 NOTE — Progress Notes (Signed)
Remote ICD transmission.   

## 2019-05-22 ENCOUNTER — Other Ambulatory Visit: Payer: Self-pay

## 2019-05-22 ENCOUNTER — Emergency Department (HOSPITAL_COMMUNITY): Payer: Medicare Other

## 2019-05-22 ENCOUNTER — Emergency Department (HOSPITAL_COMMUNITY)
Admission: EM | Admit: 2019-05-22 | Discharge: 2019-05-22 | Disposition: A | Discharge status: Home / Self Care | Payer: Medicare Other | Attending: Emergency Medicine | Admitting: Emergency Medicine

## 2019-05-22 ENCOUNTER — Encounter (HOSPITAL_COMMUNITY): Payer: Self-pay

## 2019-05-22 DIAGNOSIS — M25552 Pain in left hip: Secondary | ICD-10-CM | POA: Insufficient documentation

## 2019-05-22 DIAGNOSIS — I10 Essential (primary) hypertension: Secondary | ICD-10-CM | POA: Diagnosis not present

## 2019-05-22 DIAGNOSIS — Z794 Long term (current) use of insulin: Secondary | ICD-10-CM | POA: Diagnosis not present

## 2019-05-22 DIAGNOSIS — Z79899 Other long term (current) drug therapy: Secondary | ICD-10-CM | POA: Insufficient documentation

## 2019-05-22 DIAGNOSIS — E119 Type 2 diabetes mellitus without complications: Secondary | ICD-10-CM | POA: Diagnosis not present

## 2019-05-22 DIAGNOSIS — Z950 Presence of cardiac pacemaker: Secondary | ICD-10-CM | POA: Diagnosis not present

## 2019-05-22 MED ORDER — LIDOCAINE 4 % EX PTCH: 1.0000 | MEDICATED_PATCH | Freq: Two times a day (BID) | CUTANEOUS | 0 refills | Status: DC

## 2019-05-22 MED ORDER — NAPROXEN 375 MG PO TABS: 375.0000 mg | ORAL_TABLET | Freq: Two times a day (BID) | ORAL | 0 refills | Status: DC

## 2019-05-22 MED ORDER — NAPROXEN 250 MG PO TABS
375.0000 mg | ORAL_TABLET | Freq: Once | ORAL | Status: AC
  Administered 2019-05-22: 06:00:00 375 mg via ORAL
  Filled 2019-05-22: qty 2

## 2019-05-22 NOTE — Discharge Instructions (Addendum)
°  We signed the ER for hip pain. X-rays show a small bone spur in the hip, however it does not appear to be the main cause for your pain to [name].  We would like you to add Naprosyn to your pain regimen.  Lidocaine patch also prescribed. Follow-up with orthopedic doctors in 1 week for further management.

## 2019-05-22 NOTE — ED Triage Notes (Signed)
Pt c/o left hip pain that has been going on a few weeks. Denies injury, said she came now because she was tired of it hurting ,.

## 2019-05-23 NOTE — ED Provider Notes (Signed)
[facility] EMERGENCY DEPARTMENT Provider Note   CSN: [id number] Arrival date & time: 05/22/19  0444     History   Chief Complaint Chief Complaint  Patient presents with  . Hip Pain    left    HPI [name] is a 68 y.o. female.     HPI  68 year old female comes in a chief complaint of left hip pain.  She reports that over the last couple of weeks she has had increased pain in her left hip.  The pain is gotten more intense and is constant now.  Pain is worse with walking, however tonight she could not sleep well because of the pain decided come to the ER.  Patient is really not taken much of medicine for the pain as she is on chronic 10 mg oxycodone.  She denies any nausea, vomiting, fevers, chills.  She also denies any trauma.  No history of known hip arthritis.  Patient does not take any medications that would weaken her immune system and she does not have any underlying immuno-compromising diagnosis.   Past Medical History:  Diagnosis Date  . Anxiety   . Cardiomyopathy   . Cervical high risk HPV (human papillomavirus) test positive 01/30/2015  . Chronic constipation   . Chronic lower back pain   . Constipation 01/30/2015  . Depression   . GERD (gastroesophageal reflux disease)   . Heart failure    Chronic combined  . HTN (hypertension)   . Hypercholesteremia   . Neurogenic bladder    2008 nerve damage s/p back ssurgery  . Type II diabetes mellitus [facility])     Patient Active Problem List   Diagnosis Date Noted  . Postmenopause 02/08/2018  . Screening for colorectal cancer 02/08/2018  . Well woman exam with routine gynecological exam 02/08/2018  . Anemia in chronic kidney disease 12/15/2016  . Essential hypertension, benign 09/12/2015  . Constipation 01/30/2015  . Cervical high risk HPV (human papillomavirus) test positive 01/30/2015  . Biventricular ICD (implantable cardioverter-defibrillator) in place 04/19/2014  . Chronic systolic heart failure ([place])  12/18/2013  . Preop cardiovascular exam 11/17/2011  . Colon cancer screening 02/04/2011  . Uncontrolled type 2 diabetes mellitus with stage 3 chronic kidney disease, with long-term current use of insulin ([place]) 12/31/2009  . Mixed hyperlipidemia 12/31/2009  . ANXIETY DEPRESSION 12/31/2009  . GASTROESOPHAGEAL REFLUX DISEASE, CHRONIC 12/31/2009  . CONSTIPATION, CHRONIC 12/31/2009  . BACK PAIN, CHRONIC 12/31/2009  . Cerebral vascular accident ([place]) 05/22/2009  . HYPERTENSION, HEART CONTROLLED W/O ASSOC CHF 11/27/2008  . COMBINED HEART FAILURE, CHRONIC 11/27/2008  . PACEMAKER, PERMANENT 11/27/2008    Past Surgical History:  Procedure Laterality Date  . ACHILLES TENDON SURGERY Right 06/2012  . BACK SURGERY  2005  . BI-VENTRICULAR IMPLANTABLE CARDIOVERTER DEFIBRILLATOR  (CRT-D)  12-18-2013   upgrade of previously implanted dual chamber pacemaker to MDT CRTD with RV lead revision by Dr Lovena Le  . BI-VENTRICULAR PACEMAKER UPGRADE  12/18/2013   w/defibrillator  . BIV PACEMAKER GENERATOR CHANGE OUT N/A 12/18/2013   Procedure: BIV PACEMAKER GENERATOR CHANGE OUT;  Surgeon: Evans Lance, MD;  Location: [facility] CATH LAB;  Service: Cardiovascular;  Laterality: N/A;  . BREAST BIOPSY Left 1973   benign  . BREAST LUMPECTOMY Left 1973  . CESAREAN SECTION  1981  . CHOLECYSTECTOMY    . GASTROCNEMIUS RECESSION  06/24/2012   Procedure: GASTROCNEMIUS SLIDE;  Surgeon: Colin Rhein, MD;  Location: [facility] ORS;  Service: Orthopedics;  Laterality: Right;  . HERNIA REPAIR    .  LEAD REVISION N/A 12/18/2013   Procedure: LEAD REVISION;  Surgeon: Evans Lance, MD;  Location: [facility] CATH LAB;  Service: Cardiovascular;  Laterality: N/A;  . PACEMAKER INSERTION  2006   Dual chamber MDT pacemaker implanted - subsequent upgrade to CRTD 12-2013  . POSTERIOR LUMBAR FUSION  2006; 2008  . SIGMOIDOSCOPY  MAY 2011 w/ PROP   iTCS-->FSIG-poor bowel prep  . TUBAL LIGATION  ~ 1984  . UPPER GASTROINTESTINAL ENDOSCOPY  MAY 2011 w/ PROP   MILD  GASTRITIS 2o ETOH     OB History    Gravida  1   Para  1   Term      Preterm      AB      Living        SAB      TAB      Ectopic      Multiple      Live Births  1            Home Medications    Prior to Admission medications   Medication Sig Start Date End Date Taking? Authorizing Provider  ALPRAZolam Duanne Moron) 0.5 MG tablet Take 1 tablet by mouth 4 (four) times daily as needed for anxiety. 07/13/16   [provider]  Artificial Tear Solution (SYSTANE CONTACTS OP) Place 1 drop into both eyes 2 (two) times daily.    [provider]  aspirin EC 81 MG tablet Take 81 mg by mouth daily.    [provider]  blood glucose meter kit and supplies KIT Dispense based on patient and insurance preference. Use up to four times daily as directed. (FOR ICD-10 E10.65) 12/03/16   Cassandria Anger, MD  buPROPion [facility] SR) 150 MG 12 hr tablet  03/03/17   [provider]  [place] 2 MG/0.85ML AUIJ  02/10/19   [provider]  carvedilol (COREG) 6.25 MG tablet TAKE ONE TABLET BY MOUTH TWICE A DAY WITH A MEAL 12/29/18   Evans Lance, MD  Continuous Blood Gluc Sensor (FREESTYLE LIBRE 14 DAY SENSOR) MISC Inject 1 each into the skin every 14 (fourteen) days. Use as directed. 01/05/19   Cassandria Anger, MD  digoxin (LANOXIN) 0.125 MG tablet Take 1 tablet (125 mcg total) by mouth daily. 06/14/12   Evans Lance, MD  furosemide (LASIX) 40 MG tablet TAKE 3 TABLETS BY MOUTH ONCE DAILY Patient taking differently: Take 40 mg by mouth daily.  08/02/18   Evans Lance, MD  HYDROcodone-acetaminophen [facility]) 10-325 MG tablet Take 1-2 tablets by mouth every 6 (six) hours as needed for moderate pain.    [provider]  insulin lispro (HUMALOG KWIKPEN) 100 UNIT/ML KwikPen INJECT UP TO 50 UNITS DAILY 09/21/18   Nida, Marella Chimes, MD  LANTUS SOLOSTAR 100 UNIT/ML Solostar Pen INJECT 50 UNITS INTO THE SKIN DAILY AT 10:00PM 09/01/18    Cassandria Anger, MD  Lidocaine 4 % PTCH Apply 1 patch topically 2 (two) times daily. 05/22/19   Varney Biles, MD  losartan (COZAAR) 100 MG tablet Take 100 mg by mouth every morning.  05/22/13   [provider]  naproxen (NAPROSYN) 375 MG tablet Take 1 tablet (375 mg total) by mouth 2 (two) times daily. 05/22/19   Varney Biles, MD  omeprazole (PRILOSEC) 20 MG capsule Take 20 mg by mouth 2 (two) times daily.     [provider]  ONE TOUCH ULTRA TEST test strip USE TO CHECK BLOOD SUGAR UP TO FOUR  TIMES DAILY 03/07/18   Cassandria Anger, MD  [facility] DELICA LANCETS 93T MISC USE TO CHECK BLOOD SUGAR UP TO FOUR TIMES DAILY 01/06/18   Cassandria Anger, MD  potassium chloride SA (K-DUR,KLOR-CON) 20 MEQ tablet Take 20 mEq by mouth 2 (two) times daily.     [provider]  rosuvastatin (CRESTOR) 40 MG tablet Take 40 mg by mouth daily.  02/18/19   [provider]  UNIFINE PENTIPS 32G X 4 MM MISC USE ONE AT BEDTIME 03/08/18   Nida, Marella Chimes, MD  UNIFINE PENTIPS 32G X 4 MM MISC USE TO CHECK SUGAR FOUR TIMES DAILY 12/15/18   Cassandria Anger, MD  venlafaxine (EFFEXOR-XR) 150 MG 24 hr capsule Take 300 mg by mouth every morning.     [provider]  zolpidem (AMBIEN) 10 MG tablet Take 20 mg by mouth at bedtime.     [provider]    Family History Family History  Problem Relation Age of Onset  . Alzheimer's disease Mother   . Diabetes Mother   . Cancer Father   . Cancer Sister        breast  . Diabetes Sister   . Diabetes Brother   . Brain cancer Brother   . Diabetes Brother   . Colon cancer Neg Hx   . Colon polyps Neg Hx     Social History Social History   Tobacco Use  . Smoking status: Never Smoker  . Smokeless tobacco: Never Used  Substance Use Topics  . Alcohol use: Yes    Alcohol/week: 0.0 standard drinks    Comment: drinks beer or wine three times a week  . Drug use: No     Allergies   Patient has no  known allergies.   Review of Systems Review of Systems  Constitutional: Positive for activity change.  Musculoskeletal: Positive for arthralgias.  Allergic/Immunologic: Negative for immunocompromised state.  Neurological: Negative for numbness.     Physical Exam Updated Vital Signs BP (!) 144/82   Pulse 84   Temp 98.5 F (36.9 C)   Resp 18   Wt 56.7 kg   SpO2 98%   BMI 23.62 kg/m   Physical Exam Vitals signs and nursing note reviewed.  Constitutional:      Appearance: She is well-developed.  HENT:     Head: Normocephalic and atraumatic.  Neck:     Musculoskeletal: Normal range of motion and neck supple.  Cardiovascular:     Rate and Rhythm: Normal rate.  Pulmonary:     Effort: Pulmonary effort is normal.  Abdominal:     General: Bowel sounds are normal.  Musculoskeletal:     Comments: Patient has tenderness with active hip flexion.  She was able to tolerate passive internal and external hip rotation without any significant discomfort.  Patient has reproducible tenderness over the anterior and lateral proximal femur region.  Skin:    General: Skin is warm and dry.  Neurological:     Mental Status: She is alert and oriented to person, place, and time.      ED Treatments / Results  Labs (all labs ordered are listed, but only abnormal results are displayed) Labs Reviewed - No data to display  EKG None  Radiology Dg Hip Unilat With Pelvis 2-3 Views Left  Result Date: 05/22/2019 CLINICAL DATA:  Hip pain on the left for a few weeks EXAM: DG HIP (WITH OR WITHOUT PELVIS) 2-3V LEFT COMPARISON:  None. FINDINGS: No acute fracture, malalignment, or  erosion. Mild left hip spurring. No noted joint narrowing. There is an os acetabulum on the left. Ischial tuberosity enthesophytes. L4-5 fusion. Prominent formed stool throughout the visualized colon. Vascular calcifications. IMPRESSION: 1. No acute finding. 2. Mild left hip spurring. Electronically Signed   By: Monte Fantasia M.D.   On: 05/22/2019 07:04    Procedures Procedures (including critical care time)  Medications Ordered in ED Medications  naproxen (NAPROSYN) tablet 375 mg (375 mg Oral Given 05/22/19 1100)     Initial Impression / Assessment and Plan / ED Course  I have reviewed the triage vital signs and the nursing notes.  Pertinent labs & imaging results that were available during my care of the patient were reviewed by me and considered in my medical decision making (see chart for details).       68 year old comes in a chief complaint of hip pain.  She has no signs of hip fracture, dislocation and exam is not consistent with septic arthritis.  Pain is been getting worse over the past few days.  X-ray does show some hip spurring, which could be the underlying cause for the pain.  I also think the pain could be IT band related.  She has been advised to follow-up with orthopedic service, given that the pain has significantly disabling her.   Final Clinical Impressions(s) / ED Diagnoses   Final diagnoses:  Pain of left hip joint    ED Discharge Orders         Ordered    naproxen (NAPROSYN) 375 MG tablet  2 times daily     05/22/19 0731    Lidocaine 4 % PTCH  2 times daily     05/22/19 Hadar, Dotti Busey, MD 05/23/19 [phone]

## 2019-05-26 ENCOUNTER — Ambulatory Visit (INDEPENDENT_AMBULATORY_CARE_PROVIDER_SITE_OTHER): Payer: Medicare Other | Admitting: *Deleted

## 2019-05-26 DIAGNOSIS — I639 Cerebral infarction, unspecified: Secondary | ICD-10-CM

## 2019-05-26 DIAGNOSIS — I5022 Chronic systolic (congestive) heart failure: Secondary | ICD-10-CM | POA: Diagnosis not present

## 2019-05-26 LAB — CUP PACEART REMOTE DEVICE CHECK
Battery Remaining Longevity: 6 mo
Battery Voltage: 2.82 V
Brady Statistic AP VP Percent: 0.03 %
Brady Statistic AP VS Percent: 0.02 %
Brady Statistic AS VP Percent: 98.56 %
Brady Statistic AS VS Percent: 1.39 %
Brady Statistic RA Percent Paced: 0.04 %
Brady Statistic RV Percent Paced: 16.18 %
Date Time Interrogation Session: 20200911072109
HighPow Impedance: 74 Ohm
Implantable Lead Implant Date: 20060214
Implantable Lead Implant Date: 20150406
Implantable Lead Implant Date: 20150406
Implantable Lead Location: 753858
Implantable Lead Location: 753859
Implantable Lead Location: 753860
Implantable Lead Model: 4396
Implantable Lead Model: 5076
Implantable Pulse Generator Implant Date: 20150406
Lead Channel Impedance Value: 342 Ohm
Lead Channel Impedance Value: 399 Ohm
Lead Channel Impedance Value: 418 Ohm
Lead Channel Impedance Value: 532 Ohm
Lead Channel Impedance Value: 646 Ohm
Lead Channel Impedance Value: 817 Ohm
Lead Channel Pacing Threshold Amplitude: 0.875 V
Lead Channel Pacing Threshold Amplitude: 1 V
Lead Channel Pacing Threshold Amplitude: 2.25 V
Lead Channel Pacing Threshold Pulse Width: 0.4 ms
Lead Channel Pacing Threshold Pulse Width: 0.4 ms
Lead Channel Pacing Threshold Pulse Width: 0.6 ms
Lead Channel Sensing Intrinsic Amplitude: 15.125 mV
Lead Channel Sensing Intrinsic Amplitude: 15.125 mV
Lead Channel Sensing Intrinsic Amplitude: 2.625 mV
Lead Channel Sensing Intrinsic Amplitude: 2.625 mV
Lead Channel Setting Pacing Amplitude: 2 V
Lead Channel Setting Pacing Amplitude: 2.5 V
Lead Channel Setting Pacing Amplitude: 3.25 V
Lead Channel Setting Pacing Pulse Width: 0.4 ms
Lead Channel Setting Pacing Pulse Width: 0.6 ms
Lead Channel Setting Sensing Sensitivity: 0.3 mV

## 2019-05-31 DIAGNOSIS — N183 Chronic kidney disease, stage 3 (moderate): Secondary | ICD-10-CM | POA: Diagnosis not present

## 2019-05-31 DIAGNOSIS — Z23 Encounter for immunization: Secondary | ICD-10-CM | POA: Diagnosis not present

## 2019-05-31 DIAGNOSIS — I420 Dilated cardiomyopathy: Secondary | ICD-10-CM | POA: Diagnosis not present

## 2019-05-31 DIAGNOSIS — E1121 Type 2 diabetes mellitus with diabetic nephropathy: Secondary | ICD-10-CM | POA: Diagnosis not present

## 2019-05-31 DIAGNOSIS — M545 Low back pain: Secondary | ICD-10-CM | POA: Diagnosis not present

## 2019-06-02 NOTE — Progress Notes (Signed)
Remote ICD transmission.   

## 2019-06-06 ENCOUNTER — Other Ambulatory Visit: Payer: Self-pay | Admitting: Internal Medicine

## 2019-06-06 DIAGNOSIS — Z0000 Encounter for general adult medical examination without abnormal findings: Secondary | ICD-10-CM | POA: Diagnosis not present

## 2019-06-06 DIAGNOSIS — Z114 Encounter for screening for human immunodeficiency virus [HIV]: Secondary | ICD-10-CM | POA: Diagnosis not present

## 2019-06-06 DIAGNOSIS — M545 Low back pain: Secondary | ICD-10-CM | POA: Diagnosis not present

## 2019-06-06 DIAGNOSIS — Z79899 Other long term (current) drug therapy: Secondary | ICD-10-CM | POA: Diagnosis not present

## 2019-06-06 DIAGNOSIS — F411 Generalized anxiety disorder: Secondary | ICD-10-CM | POA: Diagnosis not present

## 2019-06-06 DIAGNOSIS — Z1159 Encounter for screening for other viral diseases: Secondary | ICD-10-CM | POA: Diagnosis not present

## 2019-06-06 DIAGNOSIS — E559 Vitamin D deficiency, unspecified: Secondary | ICD-10-CM | POA: Diagnosis not present

## 2019-06-06 DIAGNOSIS — R5383 Other fatigue: Secondary | ICD-10-CM | POA: Diagnosis not present

## 2019-06-13 ENCOUNTER — Telehealth: Payer: Self-pay | Admitting: Internal Medicine

## 2019-06-13 NOTE — Telephone Encounter (Signed)
Please give pt a call concerning her AVS from 05/26/2019  [phone]

## 2019-06-14 NOTE — Telephone Encounter (Signed)
LMOVM for pt to return call 

## 2019-06-16 NOTE — Telephone Encounter (Signed)
Second attempt:  LMOVM requesting call back from patient. Direct number given for return call.

## 2019-06-20 NOTE — Telephone Encounter (Signed)
Third attempt:  LMOVM requesting call back from patient. Direct number given for return call.  Also sent MyChart message within this encounter asking patient to call if she still has questions.

## 2019-06-26 ENCOUNTER — Ambulatory Visit (INDEPENDENT_AMBULATORY_CARE_PROVIDER_SITE_OTHER): Payer: Medicare Other | Admitting: *Deleted

## 2019-06-26 DIAGNOSIS — I5022 Chronic systolic (congestive) heart failure: Secondary | ICD-10-CM

## 2019-06-26 DIAGNOSIS — I639 Cerebral infarction, unspecified: Secondary | ICD-10-CM

## 2019-06-26 LAB — CUP PACEART REMOTE DEVICE CHECK
Battery Remaining Longevity: 5 mo
Battery Voltage: 2.82 V
Brady Statistic AP VP Percent: 0.17 %
Brady Statistic AP VS Percent: 0.03 %
Brady Statistic AS VP Percent: 97.05 %
Brady Statistic AS VS Percent: 2.75 %
Brady Statistic RA Percent Paced: 0.19 %
Brady Statistic RV Percent Paced: 13.64 %
Date Time Interrogation Session: 20201012062506
HighPow Impedance: 75 Ohm
Implantable Lead Implant Date: 20060214
Implantable Lead Implant Date: 20150406
Implantable Lead Implant Date: 20150406
Implantable Lead Location: 753858
Implantable Lead Location: 753859
Implantable Lead Location: 753860
Implantable Lead Model: 4396
Implantable Lead Model: 5076
Implantable Pulse Generator Implant Date: 20150406
Lead Channel Impedance Value: 361 Ohm
Lead Channel Impedance Value: 456 Ohm
Lead Channel Impedance Value: 456 Ohm
Lead Channel Impedance Value: 551 Ohm
Lead Channel Impedance Value: 646 Ohm
Lead Channel Impedance Value: 836 Ohm
Lead Channel Pacing Threshold Amplitude: 0.875 V
Lead Channel Pacing Threshold Amplitude: 1 V
Lead Channel Pacing Threshold Amplitude: 2.125 V
Lead Channel Pacing Threshold Pulse Width: 0.4 ms
Lead Channel Pacing Threshold Pulse Width: 0.4 ms
Lead Channel Pacing Threshold Pulse Width: 0.6 ms
Lead Channel Sensing Intrinsic Amplitude: 16.125 mV
Lead Channel Sensing Intrinsic Amplitude: 16.125 mV
Lead Channel Sensing Intrinsic Amplitude: 2.875 mV
Lead Channel Sensing Intrinsic Amplitude: 2.875 mV
Lead Channel Setting Pacing Amplitude: 2 V
Lead Channel Setting Pacing Amplitude: 2.5 V
Lead Channel Setting Pacing Amplitude: 3.25 V
Lead Channel Setting Pacing Pulse Width: 0.4 ms
Lead Channel Setting Pacing Pulse Width: 0.6 ms
Lead Channel Setting Sensing Sensitivity: 0.3 mV

## 2019-07-06 DIAGNOSIS — N184 Chronic kidney disease, stage 4 (severe): Secondary | ICD-10-CM | POA: Diagnosis not present

## 2019-07-06 DIAGNOSIS — Z79899 Other long term (current) drug therapy: Secondary | ICD-10-CM | POA: Diagnosis not present

## 2019-07-06 DIAGNOSIS — M545 Low back pain: Secondary | ICD-10-CM | POA: Diagnosis not present

## 2019-07-06 DIAGNOSIS — M961 Postlaminectomy syndrome, not elsewhere classified: Secondary | ICD-10-CM | POA: Diagnosis not present

## 2019-07-06 DIAGNOSIS — Z1159 Encounter for screening for other viral diseases: Secondary | ICD-10-CM | POA: Diagnosis not present

## 2019-07-06 DIAGNOSIS — G8929 Other chronic pain: Secondary | ICD-10-CM | POA: Diagnosis not present

## 2019-07-06 NOTE — Progress Notes (Signed)
Remote ICD transmission.   

## 2019-07-24 DIAGNOSIS — R945 Abnormal results of liver function studies: Secondary | ICD-10-CM | POA: Diagnosis not present

## 2019-07-24 DIAGNOSIS — E1140 Type 2 diabetes mellitus with diabetic neuropathy, unspecified: Secondary | ICD-10-CM | POA: Diagnosis not present

## 2019-07-24 DIAGNOSIS — E118 Type 2 diabetes mellitus with unspecified complications: Secondary | ICD-10-CM | POA: Diagnosis not present

## 2019-07-24 DIAGNOSIS — Z6823 Body mass index (BMI) 23.0-23.9, adult: Secondary | ICD-10-CM | POA: Diagnosis not present

## 2019-07-24 DIAGNOSIS — E119 Type 2 diabetes mellitus without complications: Secondary | ICD-10-CM | POA: Diagnosis not present

## 2019-07-24 DIAGNOSIS — S90425A Blister (nonthermal), left lesser toe(s), initial encounter: Secondary | ICD-10-CM | POA: Diagnosis not present

## 2019-07-24 DIAGNOSIS — E0821 Diabetes mellitus due to underlying condition with diabetic nephropathy: Secondary | ICD-10-CM | POA: Diagnosis not present

## 2019-07-24 DIAGNOSIS — E785 Hyperlipidemia, unspecified: Secondary | ICD-10-CM | POA: Diagnosis not present

## 2019-07-24 DIAGNOSIS — Z7189 Other specified counseling: Secondary | ICD-10-CM | POA: Diagnosis not present

## 2019-07-24 DIAGNOSIS — E139 Other specified diabetes mellitus without complications: Secondary | ICD-10-CM | POA: Diagnosis not present

## 2019-07-24 DIAGNOSIS — N184 Chronic kidney disease, stage 4 (severe): Secondary | ICD-10-CM | POA: Diagnosis not present

## 2019-07-24 DIAGNOSIS — E109 Type 1 diabetes mellitus without complications: Secondary | ICD-10-CM | POA: Diagnosis not present

## 2019-07-24 DIAGNOSIS — I429 Cardiomyopathy, unspecified: Secondary | ICD-10-CM | POA: Diagnosis not present

## 2019-07-26 ENCOUNTER — Ambulatory Visit (INDEPENDENT_AMBULATORY_CARE_PROVIDER_SITE_OTHER): Payer: Medicare Other | Admitting: *Deleted

## 2019-07-26 DIAGNOSIS — I5022 Chronic systolic (congestive) heart failure: Secondary | ICD-10-CM

## 2019-07-26 DIAGNOSIS — I639 Cerebral infarction, unspecified: Secondary | ICD-10-CM

## 2019-07-26 LAB — CUP PACEART REMOTE DEVICE CHECK
Battery Remaining Longevity: 5 mo
Battery Voltage: 2.82 V
Brady Statistic AP VP Percent: 0.66 %
Brady Statistic AP VS Percent: 0.03 %
Brady Statistic AS VP Percent: 96.57 %
Brady Statistic AS VS Percent: 2.74 %
Brady Statistic RA Percent Paced: 0.68 %
Brady Statistic RV Percent Paced: 15.52 %
Date Time Interrogation Session: 20201111052204
HighPow Impedance: 69 Ohm
Implantable Lead Implant Date: 20060214
Implantable Lead Implant Date: 20150406
Implantable Lead Implant Date: 20150406
Implantable Lead Location: 753858
Implantable Lead Location: 753859
Implantable Lead Location: 753860
Implantable Lead Model: 4396
Implantable Lead Model: 5076
Implantable Pulse Generator Implant Date: 20150406
Lead Channel Impedance Value: 342 Ohm
Lead Channel Impedance Value: 418 Ohm
Lead Channel Impedance Value: 418 Ohm
Lead Channel Impedance Value: 475 Ohm
Lead Channel Impedance Value: 665 Ohm
Lead Channel Impedance Value: 760 Ohm
Lead Channel Pacing Threshold Amplitude: 1 V
Lead Channel Pacing Threshold Amplitude: 1 V
Lead Channel Pacing Threshold Amplitude: 1.5 V
Lead Channel Pacing Threshold Pulse Width: 0.4 ms
Lead Channel Pacing Threshold Pulse Width: 0.4 ms
Lead Channel Pacing Threshold Pulse Width: 0.6 ms
Lead Channel Sensing Intrinsic Amplitude: 15.125 mV
Lead Channel Sensing Intrinsic Amplitude: 15.125 mV
Lead Channel Sensing Intrinsic Amplitude: 2.75 mV
Lead Channel Sensing Intrinsic Amplitude: 2.75 mV
Lead Channel Setting Pacing Amplitude: 2 V
Lead Channel Setting Pacing Amplitude: 2.5 V
Lead Channel Setting Pacing Amplitude: 3 V
Lead Channel Setting Pacing Pulse Width: 0.4 ms
Lead Channel Setting Pacing Pulse Width: 0.6 ms
Lead Channel Setting Sensing Sensitivity: 0.3 mV

## 2019-08-02 DIAGNOSIS — M961 Postlaminectomy syndrome, not elsewhere classified: Secondary | ICD-10-CM | POA: Diagnosis not present

## 2019-08-02 DIAGNOSIS — Z79899 Other long term (current) drug therapy: Secondary | ICD-10-CM | POA: Diagnosis not present

## 2019-08-02 DIAGNOSIS — F411 Generalized anxiety disorder: Secondary | ICD-10-CM | POA: Diagnosis not present

## 2019-08-02 DIAGNOSIS — E1142 Type 2 diabetes mellitus with diabetic polyneuropathy: Secondary | ICD-10-CM | POA: Diagnosis not present

## 2019-08-03 DIAGNOSIS — M79675 Pain in left toe(s): Secondary | ICD-10-CM | POA: Diagnosis not present

## 2019-08-03 DIAGNOSIS — S90425D Blister (nonthermal), left lesser toe(s), subsequent encounter: Secondary | ICD-10-CM | POA: Diagnosis not present

## 2019-08-03 DIAGNOSIS — E119 Type 2 diabetes mellitus without complications: Secondary | ICD-10-CM | POA: Diagnosis not present

## 2019-08-16 ENCOUNTER — Other Ambulatory Visit: Payer: Self-pay | Admitting: "Endocrinology

## 2019-08-17 NOTE — Progress Notes (Signed)
Remote ICD transmission.   

## 2019-08-25 ENCOUNTER — Ambulatory Visit (INDEPENDENT_AMBULATORY_CARE_PROVIDER_SITE_OTHER): Payer: Medicare Other | Admitting: *Deleted

## 2019-08-25 DIAGNOSIS — Z95810 Presence of automatic (implantable) cardiac defibrillator: Secondary | ICD-10-CM | POA: Diagnosis not present

## 2019-08-25 LAB — CUP PACEART REMOTE DEVICE CHECK
Battery Remaining Longevity: 4 mo
Battery Voltage: 2.8 V
Brady Statistic AP VP Percent: 0.48 %
Brady Statistic AP VS Percent: 0.02 %
Brady Statistic AS VP Percent: 97.1 %
Brady Statistic AS VS Percent: 2.4 %
Brady Statistic RA Percent Paced: 0.5 %
Brady Statistic RV Percent Paced: 12.01 %
Date Time Interrogation Session: 20201211022604
HighPow Impedance: 75 Ohm
Implantable Lead Implant Date: 20060214
Implantable Lead Implant Date: 20150406
Implantable Lead Implant Date: 20150406
Implantable Lead Location: 753858
Implantable Lead Location: 753859
Implantable Lead Location: 753860
Implantable Lead Model: 4396
Implantable Lead Model: 5076
Implantable Pulse Generator Implant Date: 20150406
Lead Channel Impedance Value: 361 Ohm
Lead Channel Impedance Value: 456 Ohm
Lead Channel Impedance Value: 475 Ohm
Lead Channel Impedance Value: 551 Ohm
Lead Channel Impedance Value: 665 Ohm
Lead Channel Impedance Value: 874 Ohm
Lead Channel Pacing Threshold Amplitude: 0.875 V
Lead Channel Pacing Threshold Amplitude: 0.875 V
Lead Channel Pacing Threshold Amplitude: 2.125 V
Lead Channel Pacing Threshold Pulse Width: 0.4 ms
Lead Channel Pacing Threshold Pulse Width: 0.4 ms
Lead Channel Pacing Threshold Pulse Width: 0.6 ms
Lead Channel Sensing Intrinsic Amplitude: 14.125 mV
Lead Channel Sensing Intrinsic Amplitude: 14.125 mV
Lead Channel Sensing Intrinsic Amplitude: 3 mV
Lead Channel Sensing Intrinsic Amplitude: 3 mV
Lead Channel Setting Pacing Amplitude: 2 V
Lead Channel Setting Pacing Amplitude: 2.5 V
Lead Channel Setting Pacing Amplitude: 3.25 V
Lead Channel Setting Pacing Pulse Width: 0.4 ms
Lead Channel Setting Pacing Pulse Width: 0.6 ms
Lead Channel Setting Sensing Sensitivity: 0.3 mV

## 2019-09-05 DIAGNOSIS — M961 Postlaminectomy syndrome, not elsewhere classified: Secondary | ICD-10-CM | POA: Diagnosis not present

## 2019-09-05 DIAGNOSIS — Z79899 Other long term (current) drug therapy: Secondary | ICD-10-CM | POA: Diagnosis not present

## 2019-09-05 DIAGNOSIS — E1142 Type 2 diabetes mellitus with diabetic polyneuropathy: Secondary | ICD-10-CM | POA: Diagnosis not present

## 2019-09-05 DIAGNOSIS — R300 Dysuria: Secondary | ICD-10-CM | POA: Diagnosis not present

## 2019-09-05 DIAGNOSIS — G4700 Insomnia, unspecified: Secondary | ICD-10-CM | POA: Diagnosis not present

## 2019-09-13 DIAGNOSIS — M545 Low back pain: Secondary | ICD-10-CM | POA: Diagnosis not present

## 2019-09-19 ENCOUNTER — Other Ambulatory Visit: Payer: Self-pay | Admitting: Internal Medicine

## 2019-09-20 ENCOUNTER — Encounter: Payer: Self-pay | Admitting: Family Medicine

## 2019-09-20 ENCOUNTER — Ambulatory Visit (INDEPENDENT_AMBULATORY_CARE_PROVIDER_SITE_OTHER): Payer: Medicare Other | Admitting: Family Medicine

## 2019-09-20 ENCOUNTER — Other Ambulatory Visit: Payer: Self-pay

## 2019-09-20 VITALS — BP 125/79 | HR 80 | Temp 97.8°F | Ht 61.0 in | Wt 120.0 lb

## 2019-09-20 DIAGNOSIS — E1165 Type 2 diabetes mellitus with hyperglycemia: Secondary | ICD-10-CM

## 2019-09-20 DIAGNOSIS — Z1211 Encounter for screening for malignant neoplasm of colon: Secondary | ICD-10-CM | POA: Diagnosis not present

## 2019-09-20 DIAGNOSIS — I10 Essential (primary) hypertension: Secondary | ICD-10-CM

## 2019-09-20 DIAGNOSIS — R82998 Other abnormal findings in urine: Secondary | ICD-10-CM

## 2019-09-20 DIAGNOSIS — R3129 Other microscopic hematuria: Secondary | ICD-10-CM

## 2019-09-20 DIAGNOSIS — Z794 Long term (current) use of insulin: Secondary | ICD-10-CM

## 2019-09-20 DIAGNOSIS — E1122 Type 2 diabetes mellitus with diabetic chronic kidney disease: Secondary | ICD-10-CM

## 2019-09-20 DIAGNOSIS — E782 Mixed hyperlipidemia: Secondary | ICD-10-CM

## 2019-09-20 DIAGNOSIS — N183 Chronic kidney disease, stage 3 unspecified: Secondary | ICD-10-CM | POA: Diagnosis not present

## 2019-09-20 DIAGNOSIS — F341 Dysthymic disorder: Secondary | ICD-10-CM | POA: Diagnosis not present

## 2019-09-20 DIAGNOSIS — IMO0002 Reserved for concepts with insufficient information to code with codable children: Secondary | ICD-10-CM

## 2019-09-20 DIAGNOSIS — R310 Gross hematuria: Secondary | ICD-10-CM | POA: Diagnosis not present

## 2019-09-20 LAB — POCT URINALYSIS DIP (CLINITEK)
Bilirubin, UA: NEGATIVE
Glucose, UA: 100 mg/dL — AB
Ketones, POC UA: NEGATIVE mg/dL
Nitrite, UA: POSITIVE — AB
POC PROTEIN,UA: 30 — AB
Spec Grav, UA: 1.025 (ref 1.010–1.025)
Urobilinogen, UA: 0.2 E.U./dL
pH, UA: 6 (ref 5.0–8.0)

## 2019-09-20 MED ORDER — AMOXICILLIN 250 MG PO CAPS: 250.0000 mg | ORAL_CAPSULE | Freq: Two times a day (BID) | ORAL | 0 refills | Status: DC

## 2019-09-20 NOTE — Telephone Encounter (Signed)
This is a [place] pt.  °
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
## 2019-09-20 NOTE — Patient Instructions (Addendum)
cologuard Referral to nephrology Referral to psy Fasting labwork

## 2019-09-21 ENCOUNTER — Other Ambulatory Visit: Payer: Self-pay | Admitting: Internal Medicine

## 2019-09-21 ENCOUNTER — Telehealth: Payer: Self-pay | Admitting: Internal Medicine

## 2019-09-21 DIAGNOSIS — E1165 Type 2 diabetes mellitus with hyperglycemia: Secondary | ICD-10-CM | POA: Diagnosis not present

## 2019-09-21 DIAGNOSIS — N183 Chronic kidney disease, stage 3 unspecified: Secondary | ICD-10-CM | POA: Diagnosis not present

## 2019-09-21 DIAGNOSIS — E1122 Type 2 diabetes mellitus with diabetic chronic kidney disease: Secondary | ICD-10-CM | POA: Diagnosis not present

## 2019-09-21 DIAGNOSIS — E782 Mixed hyperlipidemia: Secondary | ICD-10-CM | POA: Diagnosis not present

## 2019-09-21 DIAGNOSIS — Z1211 Encounter for screening for malignant neoplasm of colon: Secondary | ICD-10-CM | POA: Diagnosis not present

## 2019-09-21 DIAGNOSIS — Z794 Long term (current) use of insulin: Secondary | ICD-10-CM | POA: Diagnosis not present

## 2019-09-21 DIAGNOSIS — I10 Essential (primary) hypertension: Secondary | ICD-10-CM | POA: Diagnosis not present

## 2019-09-21 NOTE — Telephone Encounter (Signed)
Please give pt a call- states she's not feeling well, not having a lot of energy and she thinks its coming from her device   [phone]

## 2019-09-21 NOTE — Telephone Encounter (Signed)
Patient called because she has concerns about her battery life of her CRT-D. Saw readings on my chart and PCP referred her to call our office. She reports that her only complaint at this time is that she has decreased energy level x 1 week. No CP, no SOB, no palpitations, no syncope. Education done concerning remote transmissions and alerts for RRT. Requested patient send manual transmission for reassurance.

## 2019-09-22 ENCOUNTER — Other Ambulatory Visit: Payer: Self-pay | Admitting: Family Medicine

## 2019-09-22 LAB — URINE CULTURE
MICRO NUMBER:: 10014342
SPECIMEN QUALITY:: ADEQUATE

## 2019-09-22 LAB — COMPREHENSIVE METABOLIC PANEL
ALT: 73 IU/L — ABNORMAL HIGH (ref 0–32)
AST: 64 IU/L — ABNORMAL HIGH (ref 0–40)
Albumin/Globulin Ratio: 1.8 (ref 1.2–2.2)
Albumin: 4.5 g/dL (ref 3.8–4.8)
Alkaline Phosphatase: 140 IU/L — ABNORMAL HIGH (ref 39–117)
BUN/Creatinine Ratio: 14 (ref 12–28)
BUN: 25 mg/dL (ref 8–27)
Bilirubin Total: 0.2 mg/dL (ref 0.0–1.2)
CO2: 23 mmol/L (ref 20–29)
Calcium: 9.3 mg/dL (ref 8.7–10.3)
Chloride: 101 mmol/L (ref 96–106)
Creatinine, Ser: 1.79 mg/dL — ABNORMAL HIGH (ref 0.57–1.00)
GFR calc Af Amer: 34 mL/min/{1.73_m2} — ABNORMAL LOW (ref >59)
GFR calc non Af Amer: 29 mL/min/{1.73_m2} — ABNORMAL LOW (ref >59)
Globulin, Total: 2.5 g/dL (ref 1.5–4.5)
Glucose: 164 mg/dL — ABNORMAL HIGH (ref 65–99)
Potassium: 4.2 mmol/L (ref 3.5–5.2)
Sodium: 140 mmol/L (ref 134–144)
Total Protein: 7 g/dL (ref 6.0–8.5)

## 2019-09-22 LAB — TSH: TSH: 3.73 u[IU]/mL (ref 0.450–4.500)

## 2019-09-22 MED ORDER — NITROFURANTOIN MONOHYD MACRO 100 MG PO CAPS: 100.0000 mg | ORAL_CAPSULE | Freq: Two times a day (BID) | ORAL | 0 refills | Status: DC

## 2019-09-22 NOTE — Telephone Encounter (Signed)
LMOVM (DPR) advising manual transmission received 09/22/19 shows estimated remaining battery longevity is 3 months. Continue monthly battery checks. Gave direct DC phone number and office hours for any additional questions concerns.

## 2019-09-24 DIAGNOSIS — R3129 Other microscopic hematuria: Secondary | ICD-10-CM | POA: Insufficient documentation

## 2019-09-24 DIAGNOSIS — R82998 Other abnormal findings in urine: Secondary | ICD-10-CM | POA: Insufficient documentation

## 2019-09-25 ENCOUNTER — Ambulatory Visit (INDEPENDENT_AMBULATORY_CARE_PROVIDER_SITE_OTHER): Payer: Medicare Other | Admitting: *Deleted

## 2019-09-25 DIAGNOSIS — I5022 Chronic systolic (congestive) heart failure: Secondary | ICD-10-CM | POA: Diagnosis not present

## 2019-09-25 LAB — CUP PACEART REMOTE DEVICE CHECK
Battery Remaining Longevity: 4 mo
Battery Voltage: 2.79 V
Brady Statistic AP VP Percent: 0.03 %
Brady Statistic AP VS Percent: 0.01 %
Brady Statistic AS VP Percent: 98.42 %
Brady Statistic AS VS Percent: 1.54 %
Brady Statistic RA Percent Paced: 0.04 %
Brady Statistic RV Percent Paced: 17.11 %
Date Time Interrogation Session: 20210111001605
HighPow Impedance: 70 Ohm
Implantable Lead Implant Date: 20060214
Implantable Lead Implant Date: 20150406
Implantable Lead Implant Date: 20150406
Implantable Lead Location: 753858
Implantable Lead Location: 753859
Implantable Lead Location: 753860
Implantable Lead Model: 4396
Implantable Lead Model: 5076
Implantable Pulse Generator Implant Date: 20150406
Lead Channel Impedance Value: 342 Ohm
Lead Channel Impedance Value: 399 Ohm
Lead Channel Impedance Value: 456 Ohm
Lead Channel Impedance Value: 475 Ohm
Lead Channel Impedance Value: 665 Ohm
Lead Channel Impedance Value: 760 Ohm
Lead Channel Pacing Threshold Amplitude: 0.75 V
Lead Channel Pacing Threshold Amplitude: 0.875 V
Lead Channel Pacing Threshold Amplitude: 1.625 V
Lead Channel Pacing Threshold Pulse Width: 0.4 ms
Lead Channel Pacing Threshold Pulse Width: 0.4 ms
Lead Channel Pacing Threshold Pulse Width: 0.6 ms
Lead Channel Sensing Intrinsic Amplitude: 2.875 mV
Lead Channel Sensing Intrinsic Amplitude: 2.875 mV
Lead Channel Sensing Intrinsic Amplitude: 22.25 mV
Lead Channel Sensing Intrinsic Amplitude: 22.25 mV
Lead Channel Setting Pacing Amplitude: 2 V
Lead Channel Setting Pacing Amplitude: 2.5 V
Lead Channel Setting Pacing Amplitude: 2.75 V
Lead Channel Setting Pacing Pulse Width: 0.4 ms
Lead Channel Setting Pacing Pulse Width: 0.6 ms
Lead Channel Setting Sensing Sensitivity: 0.3 mV

## 2019-09-30 ENCOUNTER — Encounter (HOSPITAL_COMMUNITY): Payer: Self-pay | Admitting: Emergency Medicine

## 2019-09-30 ENCOUNTER — Emergency Department (HOSPITAL_COMMUNITY)
Admission: EM | Admit: 2019-09-30 | Discharge: 2019-09-30 | Disposition: A | Discharge status: Home / Self Care | Payer: Medicare Other | Attending: Emergency Medicine | Admitting: Emergency Medicine

## 2019-09-30 ENCOUNTER — Emergency Department (HOSPITAL_COMMUNITY): Payer: Medicare Other

## 2019-09-30 ENCOUNTER — Other Ambulatory Visit: Payer: Self-pay

## 2019-09-30 DIAGNOSIS — N183 Chronic kidney disease, stage 3 unspecified: Secondary | ICD-10-CM | POA: Insufficient documentation

## 2019-09-30 DIAGNOSIS — Z79899 Other long term (current) drug therapy: Secondary | ICD-10-CM | POA: Diagnosis not present

## 2019-09-30 DIAGNOSIS — I129 Hypertensive chronic kidney disease with stage 1 through stage 4 chronic kidney disease, or unspecified chronic kidney disease: Secondary | ICD-10-CM | POA: Diagnosis not present

## 2019-09-30 DIAGNOSIS — Z950 Presence of cardiac pacemaker: Secondary | ICD-10-CM | POA: Insufficient documentation

## 2019-09-30 DIAGNOSIS — Z794 Long term (current) use of insulin: Secondary | ICD-10-CM | POA: Insufficient documentation

## 2019-09-30 DIAGNOSIS — E1122 Type 2 diabetes mellitus with diabetic chronic kidney disease: Secondary | ICD-10-CM | POA: Insufficient documentation

## 2019-09-30 DIAGNOSIS — M25552 Pain in left hip: Secondary | ICD-10-CM | POA: Diagnosis not present

## 2019-09-30 MED ORDER — PREDNISONE 10 MG PO TABS: ORAL_TABLET | ORAL | 0 refills | Status: DC

## 2019-09-30 MED ORDER — PREDNISONE 50 MG PO TABS
60.0000 mg | ORAL_TABLET | Freq: Once | ORAL | Status: AC
  Administered 2019-09-30: 18:00:00 60 mg via ORAL
  Filled 2019-09-30: qty 1

## 2019-09-30 NOTE — ED Triage Notes (Signed)
Patient complains of left sided hip pain that began December 30. Patient has been referred to an orthopedic and her appointment is next Wednesday.

## 2019-09-30 NOTE — ED Provider Notes (Signed)
[facility] EMERGENCY DEPARTMENT Provider Note   CSN: [id number] Arrival date & time: 09/30/19  1553     History Chief Complaint  Patient presents with  . Hip Pain    [name] is a 69 y.o. female with a history as outlined below, significant for chronic low back pain, hypertension and type 2 diabetes and known left hip arthritis, currently pending evaluation by orthopedist Dr. Aline Brochure in 4 days presenting with increased pain. She denies injury but describes sharp pain radiating from her left posterior mid buttock region and radiates into the left upper posterior thigh.  She was seen for this previously here and a trial of nsaids and lidoderm patch was not helpful.  She does take hydrocodone 10/325 q 6 hours for her chronic back pain for many years, but states it this medicine now wears off after about 4-5 hours with this new pain.  She denies new or worsened low back pain, also denies weakness in the legs, but has noticed a numb sensation localized to the left posterior calf recently. No urinary or fecal incontinence or retention.  Denies fevers or chills.  She is asking for a steroid joint injection which has helped with hand pain in the past.    HPI     Past Medical History:  Diagnosis Date  . Anxiety   . Cardiomyopathy   . Cervical high risk HPV (human papillomavirus) test positive 01/30/2015  . Chronic constipation   . Chronic lower back pain   . Constipation 01/30/2015  . Depression   . GERD (gastroesophageal reflux disease)   . Heart failure    Chronic combined  . HTN (hypertension)   . Hypercholesteremia   . Neurogenic bladder    2008 nerve damage s/p back ssurgery  . Type II diabetes mellitus [facility])     Patient Active Problem List   Diagnosis Date Noted  . Urine leukocytes 09/24/2019  . Other microscopic hematuria 09/24/2019  . Postmenopause 02/08/2018  . Screening for colorectal cancer 02/08/2018  . Well woman exam with routine gynecological exam 02/08/2018   . Anemia in chronic kidney disease 12/15/2016  . Essential hypertension, benign 09/12/2015  . Constipation 01/30/2015  . Cervical high risk HPV (human papillomavirus) test positive 01/30/2015  . Biventricular ICD (implantable cardioverter-defibrillator) in place 04/19/2014  . Chronic systolic heart failure ([place]) 12/18/2013  . Preop cardiovascular exam 11/17/2011  . Colon cancer screening 02/04/2011  . Uncontrolled type 2 diabetes mellitus with stage 3 chronic kidney disease, with long-term current use of insulin ([place]) 12/31/2009  . Mixed hyperlipidemia 12/31/2009  . ANXIETY DEPRESSION 12/31/2009  . GASTROESOPHAGEAL REFLUX DISEASE, CHRONIC 12/31/2009  . CONSTIPATION, CHRONIC 12/31/2009  . BACK PAIN, CHRONIC 12/31/2009  . Cerebral vascular accident ([place]) 05/22/2009  . HYPERTENSION, HEART CONTROLLED W/O ASSOC CHF 11/27/2008  . COMBINED HEART FAILURE, CHRONIC 11/27/2008  . PACEMAKER, PERMANENT 11/27/2008    Past Surgical History:  Procedure Laterality Date  . ACHILLES TENDON SURGERY Right 06/2012  . BACK SURGERY  2005  . BI-VENTRICULAR IMPLANTABLE CARDIOVERTER DEFIBRILLATOR  (CRT-D)  12-18-2013   upgrade of previously implanted dual chamber pacemaker to MDT CRTD with RV lead revision by Dr Lovena Le  . BI-VENTRICULAR PACEMAKER UPGRADE  12/18/2013   w/defibrillator  . BIV PACEMAKER GENERATOR CHANGE OUT N/A 12/18/2013   Procedure: BIV PACEMAKER GENERATOR CHANGE OUT;  Surgeon: Evans Lance, MD;  Location: [facility] CATH LAB;  Service: Cardiovascular;  Laterality: N/A;  . BREAST BIOPSY Left 1973   benign  . BREAST LUMPECTOMY  Left 1973  . CESAREAN SECTION  1981  . CHOLECYSTECTOMY    . GASTROCNEMIUS RECESSION  06/24/2012   Procedure: GASTROCNEMIUS SLIDE;  Surgeon: Colin Rhein, MD;  Location: [facility] ORS;  Service: Orthopedics;  Laterality: Right;  . HERNIA REPAIR    . LEAD REVISION N/A 12/18/2013   Procedure: LEAD REVISION;  Surgeon: Evans Lance, MD;  Location: [facility] CATH LAB;  Service: Cardiovascular;   Laterality: N/A;  . PACEMAKER INSERTION  2006   Dual chamber MDT pacemaker implanted - subsequent upgrade to CRTD 12-2013  . POSTERIOR LUMBAR FUSION  2006; 2008  . SIGMOIDOSCOPY  MAY 2011 w/ PROP   iTCS-->FSIG-poor bowel prep  . TUBAL LIGATION  ~ 1984  . UPPER GASTROINTESTINAL ENDOSCOPY  MAY 2011 w/ PROP   MILD GASTRITIS 2o ETOH     OB History    Gravida  1   Para  1   Term      Preterm      AB      Living        SAB      TAB      Ectopic      Multiple      Live Births  1           Family History  Problem Relation Age of Onset  . Alzheimer's disease Mother   . Diabetes Mother   . Cancer Father   . Cancer Sister        breast  . Diabetes Sister   . Diabetes Brother   . Brain cancer Brother   . Diabetes Brother   . Colon cancer Neg Hx   . Colon polyps Neg Hx     Social History   Tobacco Use  . Smoking status: Never Smoker  . Smokeless tobacco: Never Used  Substance Use Topics  . Alcohol use: Yes    Alcohol/week: 0.0 standard drinks    Comment: drinks beer or wine three times a week  . Drug use: No    Home Medications Prior to Admission medications   Medication Sig Start Date End Date Taking? Authorizing Provider  ALPRAZolam Duanne Moron) 0.5 MG tablet Take 1 tablet by mouth 4 (four) times daily as needed for anxiety. 07/13/16   [provider]  amoxicillin (AMOXIL) 250 MG capsule Take 1 capsule (250 mg total) by mouth 2 (two) times daily. 09/20/19   Corum, Rex Kras, MD  Artificial Tear Solution (SYSTANE CONTACTS OP) Place 1 drop into both eyes 2 (two) times daily.    [provider]  aspirin EC 81 MG tablet Take 81 mg by mouth daily.    [provider]  blood glucose meter kit and supplies KIT Dispense based on patient and insurance preference. Use up to four times daily as directed. (FOR ICD-10 E10.65) 12/03/16   Cassandria Anger, MD  buPROPion [facility] SR) 150 MG 12 hr tablet  03/03/17   [provider]    [place] 2 MG/0.85ML AUIJ  02/10/19   [provider]  carvedilol (COREG) 6.25 MG tablet TAKE ONE TABLET BY MOUTH TWICE A DAY WITH A MEAL 09/20/19   Arnoldo Lenis, MD  Continuous Blood Gluc Sensor (FREESTYLE LIBRE 14 DAY SENSOR) MISC Inject 1 each into the skin every 14 (fourteen) days. Use as directed. 01/05/19   Cassandria Anger, MD  digoxin (LANOXIN) 0.125 MG tablet Take 1 tablet (125 mcg total) by mouth daily. 06/14/12   Evans Lance, MD  furosemide (  LASIX) 40 MG tablet Take 1 tablet (40 mg total) by mouth daily. 06/06/19   Evans Lance, MD  HYDROcodone-acetaminophen [facility]) 10-325 MG tablet Take 1-2 tablets by mouth every 6 (six) hours as needed for moderate pain.    [provider]  insulin lispro (HUMALOG KWIKPEN) 100 UNIT/ML KwikPen INJECT UP TO 50 UNITS DAILY 09/21/18   Nida, Marella Chimes, MD  LANTUS SOLOSTAR 100 UNIT/ML Solostar Pen INJECT 50 UNITS INTO THE SKIN DAILY AT 10:00PM 09/01/18   Cassandria Anger, MD  Lidocaine 4 % PTCH Apply 1 patch topically 2 (two) times daily. 05/22/19   Varney Biles, MD  losartan (COZAAR) 100 MG tablet Take 100 mg by mouth every morning.  05/22/13   [provider]  naproxen (NAPROSYN) 375 MG tablet Take 1 tablet (375 mg total) by mouth 2 (two) times daily. 05/22/19   Varney Biles, MD  nitrofurantoin, macrocrystal-monohydrate, (MACROBID) 100 MG capsule Take 1 capsule (100 mg total) by mouth 2 (two) times daily. 09/22/19   Corum, Rex Kras, MD  omeprazole (PRILOSEC) 20 MG capsule Take 20 mg by mouth 2 (two) times daily.     [provider]  ONE TOUCH ULTRA TEST test strip USE TO CHECK BLOOD SUGAR UP TO FOUR TIMES DAILY 03/07/18   Nida, Marella Chimes, MD  [facility] DELICA LANCETS 70Y MISC USE TO CHECK BLOOD SUGAR UP TO FOUR TIMES DAILY 01/06/18   Nida, Marella Chimes, MD  potassium chloride SA (K-DUR,KLOR-CON) 20 MEQ tablet Take 20 mEq by mouth 2 (two) times daily.     [provider]  predniSONE  (DELTASONE) 10 MG tablet Take 5 tablets day one, 4 tablets day two, 3 tablets day three ,2 tablets day four, then 1 tablet day five 09/30/19   Zai Chmiel, Almyra Free, PA-C  rosuvastatin (CRESTOR) 40 MG tablet Take 40 mg by mouth daily.  02/18/19   [provider]  UNIFINE PENTIPS 32G X 4 MM MISC USE ONE AT BEDTIME 03/08/18   Nida, Marella Chimes, MD  UNIFINE PENTIPS 32G X 4 MM MISC USE TO CHECK SUGAR FOUR TIMES DAILY 12/15/18   Cassandria Anger, MD  venlafaxine (EFFEXOR-XR) 150 MG 24 hr capsule Take 300 mg by mouth every morning.     [provider]  zolpidem (AMBIEN) 10 MG tablet Take 20 mg by mouth at bedtime. 1 1/2 at bedtime    [provider]    Allergies    Patient has no known allergies.  Review of Systems   Review of Systems  Constitutional: Negative for fever.  Respiratory: Negative for shortness of breath.   Cardiovascular: Negative for chest pain and leg swelling.  Gastrointestinal: Negative for abdominal distention, abdominal pain and constipation.  Genitourinary: Negative for difficulty urinating, dysuria, flank pain, frequency and urgency.  Musculoskeletal: Positive for arthralgias and back pain. Negative for gait problem and joint swelling.  Skin: Negative for rash.  Neurological: Positive for numbness. Negative for weakness.    Physical Exam Updated Vital Signs BP 127/71 (BP Location: Right Arm)   Pulse 77   Temp 98.2 F (36.8 C) (Oral)   Ht '5\' 1"'$  (1.549 m)   Wt 54.4 kg   SpO2 100%   BMI 22.67 kg/m   Physical Exam Vitals and nursing note reviewed.  Constitutional:      Appearance: She is well-developed.  HENT:     Head: Normocephalic.  Eyes:     Conjunctiva/sclera: Conjunctivae normal.  Cardiovascular:     Rate and Rhythm: Normal rate.  Comments: Pedal pulses normal. Pulmonary:     Effort: Pulmonary effort is normal.  Abdominal:     General: There is no distension.     Palpations: Abdomen is soft. There is no mass.    Musculoskeletal:        General: Normal range of motion.     Cervical back: Normal range of motion and neck supple.     Lumbar back: No swelling, edema, spasms or tenderness.       Legs:     Comments: Pain from mid left buttock through left lateral hip. No rash, no edema. No lumbar midline pain.   Skin:    General: Skin is warm and dry.  Neurological:     Mental Status: She is alert.     Sensory: No sensory deficit.     Motor: No tremor or atrophy.     Gait: Gait normal.     Deep Tendon Reflexes:     Reflex Scores:      Patellar reflexes are 2+ on the right side and 2+ on the left side.      Achilles reflexes are 2+ on the right side and 2+ on the left side.    Comments: No strength deficit noted in hip and knee flexor and extensor muscle groups.  Ankle flexion and extension intact. Negative SLR bilaterally.  Decreased sensation left posterior calf.       ED Results / Procedures / Treatments   Labs (all labs ordered are listed, but only abnormal results are displayed) Labs Reviewed - No data to display  EKG None  Radiology DG Hip Unilat With Pelvis 2-3 Views Left  Result Date: 09/30/2019 CLINICAL DATA:  69 year old female with left hip pain. EXAM: DG HIP (WITH OR WITHOUT PELVIS) 2-3V LEFT COMPARISON:  Left hip radiograph dated 05/22/2019. FINDINGS: There is no acute fracture or dislocation. Mild bilateral hip arthritic changes, left greater right. There is mild associated spurring as seen on the prior radiograph. Lower lumbar fusion screws. The soft tissues are unremarkable. IMPRESSION: 1. No acute fracture or dislocation. 2. Mild bilateral hip osteoarthritic changes with spurring, left greater right, and similar to prior radiograph. Electronically Signed   By: Anner Crete M.D.   On: 09/30/2019 16:41    Procedures Procedures (including critical care time)  Medications Ordered in ED Medications  predniSONE (DELTASONE) tablet 60 mg (has no administration in time range)     ED Course  I have reviewed the triage vital signs and the nursing notes.  Pertinent labs & imaging results that were available during my care of the patient were reviewed by me and considered in my medical decision making (see chart for details).    MDM Rules/Calculators/A&P                      Exam and history suggesting soft tissue inflammation and/or sciatica.  She has no groin pain with active or passive ROM of the left hip.   Imaging positive for mild arthritis changes.  Discussed general steroid injection vs prednisone tablets.  Pt opted for prednisone tabs.  6 day taper given.  Heat tx, activities as tolerated.  Plan f/u with Dr. Aline Brochure as planned.  Advised to keep close watch on her cbgs while on prednisone - she has Dexcom for frequent checks.   Final Clinical Impression(s) / ED Diagnoses Final diagnoses:  Left hip pain    Rx / DC Orders ED Discharge Orders  Ordered    predniSONE (DELTASONE) 10 MG tablet     09/30/19 1738           Landis Martins 09/30/19 1827    Nat Christen, MD 10/01/19 1728

## 2019-09-30 NOTE — Discharge Instructions (Addendum)
Keep your appointment with Dr. Aline Brochure on Wednesday as you have scheduled.  Take your next dose of prednisone tomorrow evening.  Continue application of heat 20 minutes 3-4 times daily.  Make sure to keep a close watch on your blood glucose levels while on prednisone.

## 2019-09-30 NOTE — Progress Notes (Signed)
ICD remote 

## 2019-10-04 ENCOUNTER — Ambulatory Visit: Payer: Medicare Other

## 2019-10-04 ENCOUNTER — Other Ambulatory Visit: Payer: Self-pay

## 2019-10-04 ENCOUNTER — Ambulatory Visit (INDEPENDENT_AMBULATORY_CARE_PROVIDER_SITE_OTHER): Payer: Medicare Other | Admitting: Orthopedic Surgery

## 2019-10-04 VITALS — BP 99/53 | HR 87 | Temp 97.3°F | Ht 61.0 in | Wt 113.0 lb

## 2019-10-04 DIAGNOSIS — G8929 Other chronic pain: Secondary | ICD-10-CM | POA: Diagnosis not present

## 2019-10-04 DIAGNOSIS — M961 Postlaminectomy syndrome, not elsewhere classified: Secondary | ICD-10-CM | POA: Diagnosis not present

## 2019-10-04 DIAGNOSIS — M5442 Lumbago with sciatica, left side: Secondary | ICD-10-CM | POA: Diagnosis not present

## 2019-10-04 DIAGNOSIS — Z79899 Other long term (current) drug therapy: Secondary | ICD-10-CM | POA: Diagnosis not present

## 2019-10-04 NOTE — Progress Notes (Signed)
[name]  10/04/2019  Body mass index is 21.35 kg/m.   HISTORY SECTION :  Chief Complaint  Patient presents with  . Back Pain    Back pain.   69 year old female followed by pain management at [facility] medical history of 2 lumbar surgeries by Dr. Joya Salm in 2006 and 8 presents with a 4-week history of left lower back pain radiating into both left and right lower extremities with bilateral numbness and tingling requiring ER visit on September 30, 2019 at which time they gave her steroids however despite that and her chronic opioids of hydrocodone 10 mg she still having severe pain in the left leg with weakness and difficulty walking.  Her bowel and bladder function remain intact she has a history of defibrillator.  She presents to [name] from the ER for evaluation and treatment.  Review of systems heartburn history of hematuria depression back pain all other systems were negative  History of hypertension kidney disease liver disease heart disease pneumonia and tuberculosis  Other medical history is negative    ROS As above    has a past medical history of Anxiety, Cardiomyopathy, Cervical high risk HPV (human papillomavirus) test positive (01/30/2015), Chronic constipation, Chronic lower back pain, Constipation (01/30/2015), Depression, GERD (gastroesophageal reflux disease), Heart failure, HTN (hypertension), Hypercholesteremia, Neurogenic bladder, and Type II diabetes mellitus ([place]).   Past Surgical History:  Procedure Laterality Date  . ACHILLES TENDON SURGERY Right 06/2012  . BACK SURGERY  2005  . BI-VENTRICULAR IMPLANTABLE CARDIOVERTER DEFIBRILLATOR  (CRT-D)  12-18-2013   upgrade of previously implanted dual chamber pacemaker to MDT CRTD with RV lead revision by Dr Lovena Le  . BI-VENTRICULAR PACEMAKER UPGRADE  12/18/2013   w/defibrillator  . BIV PACEMAKER GENERATOR CHANGE OUT N/A 12/18/2013   Procedure: BIV PACEMAKER GENERATOR CHANGE OUT;  Surgeon: Evans Lance, MD;  Location: [facility]  CATH LAB;  Service: Cardiovascular;  Laterality: N/A;  . BREAST BIOPSY Left 1973   benign  . BREAST LUMPECTOMY Left 1973  . CESAREAN SECTION  1981  . CHOLECYSTECTOMY    . GASTROCNEMIUS RECESSION  06/24/2012   Procedure: GASTROCNEMIUS SLIDE;  Surgeon: Colin Rhein, MD;  Location: [facility] ORS;  Service: Orthopedics;  Laterality: Right;  . HERNIA REPAIR    . LEAD REVISION N/A 12/18/2013   Procedure: LEAD REVISION;  Surgeon: Evans Lance, MD;  Location: [facility] CATH LAB;  Service: Cardiovascular;  Laterality: N/A;  . PACEMAKER INSERTION  2006   Dual chamber MDT pacemaker implanted - subsequent upgrade to CRTD 12-2013  . POSTERIOR LUMBAR FUSION  2006; 2008  . SIGMOIDOSCOPY  MAY 2011 w/ PROP   iTCS-->FSIG-poor bowel prep  . TUBAL LIGATION  ~ 1984  . UPPER GASTROINTESTINAL ENDOSCOPY  MAY 2011 w/ PROP   MILD GASTRITIS 2o ETOH    Body mass index is 21.35 kg/m.   No Known Allergies   Current Outpatient Medications:  .  ALPRAZolam (XANAX) 0.5 MG tablet, Take 1 tablet by mouth 4 (four) times daily as needed for anxiety., Disp: , Rfl:  .  Artificial Tear Solution (SYSTANE CONTACTS OP), Place 1 drop into both eyes 2 (two) times daily., Disp: , Rfl:  .  aspirin EC 81 MG tablet, Take 81 mg by mouth daily., Disp: , Rfl:  .  blood glucose meter kit and supplies KIT, Dispense based on patient and insurance preference. Use up to four times daily as directed. (FOR ICD-10 E10.65), Disp: 1 each, Rfl: 0 .  buPROPion (WELLBUTRIN SR) 150 MG 12  hr tablet, , Disp: , Rfl:  .  BYDUREON [place] 2 MG/0.85ML AUIJ, , Disp: , Rfl:  .  carvedilol (COREG) 6.25 MG tablet, TAKE ONE TABLET BY MOUTH TWICE A DAY WITH A MEAL, Disp: 60 tablet, Rfl: 6 .  Continuous Blood Gluc Sensor (FREESTYLE LIBRE 14 DAY SENSOR) MISC, Inject 1 each into the skin every 14 (fourteen) days. Use as directed., Disp: 6 each, Rfl: 2 .  digoxin (LANOXIN) 0.125 MG tablet, Take 1 tablet (125 mcg total) by mouth daily., Disp: 90 tablet, Rfl: 3 .  furosemide  (LASIX) 40 MG tablet, Take 1 tablet (40 mg total) by mouth daily., Disp: 90 tablet, Rfl: 3 .  HYDROcodone-acetaminophen (NORCO) 10-325 MG tablet, Take 1-2 tablets by mouth every 6 (six) hours as needed for moderate pain., Disp: , Rfl:  .  insulin lispro (HUMALOG KWIKPEN) 100 UNIT/ML KwikPen, INJECT UP TO 50 UNITS DAILY, Disp: 15 mL, Rfl: 2 .  LANTUS SOLOSTAR 100 UNIT/ML Solostar Pen, INJECT 50 UNITS INTO THE SKIN DAILY AT 10:00PM, Disp: 15 mL, Rfl: 2 .  Lidocaine 4 % PTCH, Apply 1 patch topically 2 (two) times daily., Disp: 12 patch, Rfl: 0 .  nitrofurantoin, macrocrystal-monohydrate, (MACROBID) 100 MG capsule, Take 1 capsule (100 mg total) by mouth 2 (two) times daily., Disp: 10 capsule, Rfl: 0 .  omeprazole (PRILOSEC) 20 MG capsule, Take 20 mg by mouth 2 (two) times daily. , Disp: , Rfl:  .  ONETOUCH DELICA LANCETS 50I MISC, USE TO CHECK BLOOD SUGAR UP TO FOUR TIMES DAILY, Disp: 400 each, Rfl: 2 .  potassium chloride SA (K-DUR,KLOR-CON) 20 MEQ tablet, Take 20 mEq by mouth 2 (two) times daily. , Disp: , Rfl:  .  rosuvastatin (CRESTOR) 40 MG tablet, Take 40 mg by mouth daily. , Disp: , Rfl:  .  UNIFINE PENTIPS 32G X 4 MM MISC, USE TO CHECK SUGAR FOUR TIMES DAILY, Disp: 200 each, Rfl: 2 .  venlafaxine (EFFEXOR-XR) 150 MG 24 hr capsule, Take 300 mg by mouth every morning. , Disp: , Rfl:  .  zolpidem (AMBIEN) 10 MG tablet, Take 20 mg by mouth at bedtime. 1 1/2 at bedtime, Disp: , Rfl:  .  amoxicillin (AMOXIL) 250 MG capsule, Take 1 capsule (250 mg total) by mouth 2 (two) times daily. (Patient not taking: Reported on 10/04/2019), Disp: 10 capsule, Rfl: 0 .  losartan (COZAAR) 100 MG tablet, Take 100 mg by mouth every morning. , Disp: , Rfl:  .  naproxen (NAPROSYN) 375 MG tablet, Take 1 tablet (375 mg total) by mouth 2 (two) times daily., Disp: 10 tablet, Rfl: 0 .  ONE TOUCH ULTRA TEST test strip, USE TO CHECK BLOOD SUGAR UP TO FOUR TIMES DAILY, Disp: 150 each, Rfl: 5 .  predniSONE (DELTASONE) 10 MG  tablet, Take 5 tablets day one, 4 tablets day two, 3 tablets day three ,2 tablets day four, then 1 tablet day five (Patient not taking: Reported on 10/04/2019), Disp: 15 tablet, Rfl: 0 .  UNIFINE PENTIPS 32G X 4 MM MISC, USE ONE AT BEDTIME, Disp: 100 each, Rfl: 2   PHYSICAL EXAM SECTION: 1) BP (!) 99/53   Pulse 87   Temp (!) 97.3 F (36.3 C)   Ht _0  (1.549 m)   Wt 113 lb (51.3 kg)   BMI 21.35 kg/m   Body mass index is 21.35 kg/m. General appearance: Well-developed well-nourished no gross deformities  2) Cardiovascular normal pulse and perfusion in the lower extremities normal color without edema  3) Neurologically deep tendon reflexes are equal and normal, no sensation loss or deficits no pathologic reflexes  4) Psychological: Awake alert and oriented x3 mood and affect normal  5) Skin no lacerations or ulcerations no nodularity no palpable masses, no erythema or nodularity  6) Musculoskeletal: Tenderness in the lumbar spine starting at about L2 down through S1 and then on the left side with negative straight leg raise right positive left 2+ reflexes at the knees 1+ at the ankles  Dorsiflexion plantarflexion strength normal   MEDICAL DECISION MAKING  A.  Encounter Diagnosis  Name Primary?  . Chronic left-sided low back pain with left-sided sciatica Yes    B. DATA ANALYSED:   IMAGING: Independent interpretation of images: X-rays of the pelvis show arthritis in the left hip with some lateral acetabular spurs with minimal joint space narrowing there is slight decrease in head neck offset on the lateral hip x-ray and we see fusion device at looks like L5-S1  Orders: I ordered an x-ray of her lumbar spine.  Report is in the chart.  Coronal alignment looks normal sagittal alignment shows slight anterolisthesis of L5 on S1 the fusion is actually L4 and 5 looks to be intact with some osteophyte formation superior to the fusion construct  Outside records reviewed: Emergency  department on September 30, 2019 notes indicate patient is on a pain management regimen at a pain management center came there complaining of back pain radiating to the left thigh and complained of pain in the buttock area lateral hip, imaging was done I reviewed those independently and she was placed on prednisone  C. MANAGEMENT   She is going to need a CT scan secondary to the defibrillator I will read it and then call her with the result and then get her over to neurosurgery  She will do her pain management with [facility] medical as she is doing  Summary chronic problem with exacerbation, CT scan No orders of the defined types were placed in this encounter.     Arther Abbott, MD  10/04/2019 10:15 AM

## 2019-10-04 NOTE — Patient Instructions (Signed)
[name] we will arrange a CAT scan you will get a call on the date after I read I will call you let you know what it shows and then have you set up with [place] [facility] for follow-up  Please see [facility] medical for pain management

## 2019-10-09 ENCOUNTER — Telehealth: Payer: Self-pay | Admitting: Orthopedic Surgery

## 2019-10-09 NOTE — Telephone Encounter (Signed)
FYI on our referral to Dr Ernestina Patches for this patient.

## 2019-10-13 ENCOUNTER — Other Ambulatory Visit: Payer: Self-pay | Admitting: Family Medicine

## 2019-10-13 ENCOUNTER — Other Ambulatory Visit: Payer: Self-pay

## 2019-10-13 ENCOUNTER — Ambulatory Visit (HOSPITAL_COMMUNITY)
Admission: RE | Admit: 2019-10-13 | Discharge: 2019-10-13 | Disposition: A | Discharge status: Home / Self Care | Payer: Medicare Other | Source: Ambulatory Visit | Attending: Orthopedic Surgery | Admitting: Orthopedic Surgery

## 2019-10-13 DIAGNOSIS — M48061 Spinal stenosis, lumbar region without neurogenic claudication: Secondary | ICD-10-CM | POA: Diagnosis not present

## 2019-10-13 DIAGNOSIS — G8929 Other chronic pain: Secondary | ICD-10-CM | POA: Diagnosis not present

## 2019-10-13 DIAGNOSIS — M5442 Lumbago with sciatica, left side: Secondary | ICD-10-CM | POA: Insufficient documentation

## 2019-10-16 ENCOUNTER — Telehealth: Payer: Self-pay | Admitting: Orthopedic Surgery

## 2019-10-16 ENCOUNTER — Telehealth: Payer: Self-pay | Admitting: Radiology

## 2019-10-16 DIAGNOSIS — G8929 Other chronic pain: Secondary | ICD-10-CM

## 2019-10-16 DIAGNOSIS — M5442 Lumbago with sciatica, left side: Secondary | ICD-10-CM

## 2019-10-16 NOTE — Telephone Encounter (Signed)
Discussed with Ms. [name] on CT scan results showing severe spinal stenosis previous fusion seem to heal okay but has disease above and below the fusion level  Patient does not want to return to Dr. Joya Salm but needs referral so he can [name] try Dr. Patrice Paradise

## 2019-10-16 NOTE — Telephone Encounter (Signed)
-----   Message from Carole Civil, MD sent at 10/16/2019 12:06 PM EST ----- Referral doesn't want botero so lets try cohen

## 2019-10-24 DIAGNOSIS — N1832 Chronic kidney disease, stage 3b: Secondary | ICD-10-CM | POA: Insufficient documentation

## 2019-10-25 ENCOUNTER — Ambulatory Visit (INDEPENDENT_AMBULATORY_CARE_PROVIDER_SITE_OTHER): Payer: Medicare Other | Admitting: *Deleted

## 2019-10-25 DIAGNOSIS — I5022 Chronic systolic (congestive) heart failure: Secondary | ICD-10-CM

## 2019-10-25 LAB — CUP PACEART REMOTE DEVICE CHECK
Battery Remaining Longevity: 4 mo
Battery Voltage: 2.77 V
Brady Statistic AP VP Percent: 0.06 %
Brady Statistic AP VS Percent: 0.01 %
Brady Statistic AS VP Percent: 98.65 %
Brady Statistic AS VS Percent: 1.27 %
Brady Statistic RA Percent Paced: 0.07 %
Brady Statistic RV Percent Paced: 23.46 %
Date Time Interrogation Session: 20210210012405
HighPow Impedance: 76 Ohm
Implantable Lead Implant Date: 20060214
Implantable Lead Implant Date: 20150406
Implantable Lead Implant Date: 20150406
Implantable Lead Location: 753858
Implantable Lead Location: 753859
Implantable Lead Location: 753860
Implantable Lead Model: 4396
Implantable Lead Model: 5076
Implantable Pulse Generator Implant Date: 20150406
Lead Channel Impedance Value: 361 Ohm
Lead Channel Impedance Value: 456 Ohm
Lead Channel Impedance Value: 475 Ohm
Lead Channel Impedance Value: 589 Ohm
Lead Channel Impedance Value: 703 Ohm
Lead Channel Impedance Value: 874 Ohm
Lead Channel Pacing Threshold Amplitude: 0.875 V
Lead Channel Pacing Threshold Amplitude: 0.875 V
Lead Channel Pacing Threshold Amplitude: 1.75 V
Lead Channel Pacing Threshold Pulse Width: 0.4 ms
Lead Channel Pacing Threshold Pulse Width: 0.4 ms
Lead Channel Pacing Threshold Pulse Width: 0.6 ms
Lead Channel Sensing Intrinsic Amplitude: 2.75 mV
Lead Channel Sensing Intrinsic Amplitude: 2.75 mV
Lead Channel Sensing Intrinsic Amplitude: 21.875 mV
Lead Channel Sensing Intrinsic Amplitude: 21.875 mV
Lead Channel Setting Pacing Amplitude: 2 V
Lead Channel Setting Pacing Amplitude: 2.5 V
Lead Channel Setting Pacing Amplitude: 2.75 V
Lead Channel Setting Pacing Pulse Width: 0.4 ms
Lead Channel Setting Pacing Pulse Width: 0.6 ms
Lead Channel Setting Sensing Sensitivity: 0.3 mV

## 2019-10-25 NOTE — Progress Notes (Signed)
ICD Remote  

## 2019-10-25 NOTE — Addendum Note (Signed)
Addended by: Jennette Banker on: 10/25/2019 04:05 PM   Modules accepted: Level of Service

## 2019-10-28 ENCOUNTER — Other Ambulatory Visit: Payer: Self-pay

## 2019-10-28 ENCOUNTER — Ambulatory Visit: Payer: Medicare Other | Attending: Internal Medicine

## 2019-10-28 DIAGNOSIS — Z23 Encounter for immunization: Secondary | ICD-10-CM

## 2019-10-28 NOTE — Progress Notes (Signed)
   Covid-19 Vaccination Clinic  Name:  [name]    MRN: [id number] DOB: 1950/11/29  10/28/2019  [name] was observed post Covid-19 immunization for 15 minutes without incidence. She was provided with Vaccine Information Sheet and instruction to access the V-Safe system.   [name] was instructed to call 911 with any severe reactions post vaccine: [name] Difficulty breathing  . Swelling of your face and throat  . A fast heartbeat  . A bad rash all over your body  . Dizziness and weakness    Immunizations Administered    Name Date Dose VIS Date Route   Moderna COVID-19 Vaccine 10/28/2019  1:49 PM 0.5 mL 08/15/2019 Intramuscular   Manufacturer: Moderna   Lot: [id number]   [place]: [phone]

## 2019-10-30 DIAGNOSIS — I5042 Chronic combined systolic (congestive) and diastolic (congestive) heart failure: Secondary | ICD-10-CM | POA: Diagnosis not present

## 2019-10-30 DIAGNOSIS — Z79899 Other long term (current) drug therapy: Secondary | ICD-10-CM | POA: Diagnosis not present

## 2019-10-30 DIAGNOSIS — E211 Secondary hyperparathyroidism, not elsewhere classified: Secondary | ICD-10-CM | POA: Diagnosis not present

## 2019-10-30 DIAGNOSIS — D638 Anemia in other chronic diseases classified elsewhere: Secondary | ICD-10-CM | POA: Diagnosis not present

## 2019-10-30 DIAGNOSIS — R809 Proteinuria, unspecified: Secondary | ICD-10-CM | POA: Diagnosis not present

## 2019-10-30 DIAGNOSIS — E1129 Type 2 diabetes mellitus with other diabetic kidney complication: Secondary | ICD-10-CM | POA: Diagnosis not present

## 2019-10-30 DIAGNOSIS — E559 Vitamin D deficiency, unspecified: Secondary | ICD-10-CM | POA: Diagnosis not present

## 2019-10-30 DIAGNOSIS — E1122 Type 2 diabetes mellitus with diabetic chronic kidney disease: Secondary | ICD-10-CM | POA: Diagnosis not present

## 2019-10-30 DIAGNOSIS — N189 Chronic kidney disease, unspecified: Secondary | ICD-10-CM | POA: Diagnosis not present

## 2019-11-01 DIAGNOSIS — M961 Postlaminectomy syndrome, not elsewhere classified: Secondary | ICD-10-CM | POA: Diagnosis not present

## 2019-11-01 DIAGNOSIS — Z79899 Other long term (current) drug therapy: Secondary | ICD-10-CM | POA: Diagnosis not present

## 2019-11-01 DIAGNOSIS — M48062 Spinal stenosis, lumbar region with neurogenic claudication: Secondary | ICD-10-CM | POA: Diagnosis not present

## 2019-11-03 DIAGNOSIS — M791 Myalgia, unspecified site: Secondary | ICD-10-CM | POA: Diagnosis not present

## 2019-11-03 DIAGNOSIS — I429 Cardiomyopathy, unspecified: Secondary | ICD-10-CM | POA: Diagnosis not present

## 2019-11-03 DIAGNOSIS — E118 Type 2 diabetes mellitus with unspecified complications: Secondary | ICD-10-CM | POA: Diagnosis not present

## 2019-11-03 DIAGNOSIS — E785 Hyperlipidemia, unspecified: Secondary | ICD-10-CM | POA: Diagnosis not present

## 2019-11-03 DIAGNOSIS — Z79899 Other long term (current) drug therapy: Secondary | ICD-10-CM | POA: Diagnosis not present

## 2019-11-03 DIAGNOSIS — M4316 Spondylolisthesis, lumbar region: Secondary | ICD-10-CM | POA: Diagnosis not present

## 2019-11-03 DIAGNOSIS — M4716 Other spondylosis with myelopathy, lumbar region: Secondary | ICD-10-CM | POA: Diagnosis not present

## 2019-11-03 DIAGNOSIS — M4326 Fusion of spine, lumbar region: Secondary | ICD-10-CM | POA: Diagnosis not present

## 2019-11-03 DIAGNOSIS — E139 Other specified diabetes mellitus without complications: Secondary | ICD-10-CM | POA: Diagnosis not present

## 2019-11-03 DIAGNOSIS — E0821 Diabetes mellitus due to underlying condition with diabetic nephropathy: Secondary | ICD-10-CM | POA: Diagnosis not present

## 2019-11-03 DIAGNOSIS — N184 Chronic kidney disease, stage 4 (severe): Secondary | ICD-10-CM | POA: Diagnosis not present

## 2019-11-17 ENCOUNTER — Other Ambulatory Visit: Payer: Self-pay | Admitting: Rehabilitation

## 2019-11-17 ENCOUNTER — Telehealth: Payer: Self-pay | Admitting: Nurse Practitioner

## 2019-11-17 DIAGNOSIS — M4716 Other spondylosis with myelopathy, lumbar region: Secondary | ICD-10-CM

## 2019-11-17 NOTE — Telephone Encounter (Signed)
Phone call to patient to verify medication list and allergies for myelogram procedure. Pt instructed to hold wellbutrin and effexor for 48hrs prior to myelogram appointment time. Pt verbalized understanding. Pre and post procedure instructions reviewed with pt.

## 2019-11-21 ENCOUNTER — Ambulatory Visit
Admission: RE | Admit: 2019-11-21 | Discharge: 2019-11-21 | Disposition: A | Discharge status: Home / Self Care | Payer: 59 | Source: Ambulatory Visit | Attending: Rehabilitation | Admitting: Rehabilitation

## 2019-11-21 ENCOUNTER — Other Ambulatory Visit: Payer: Self-pay

## 2019-11-21 DIAGNOSIS — M4716 Other spondylosis with myelopathy, lumbar region: Secondary | ICD-10-CM

## 2019-11-21 DIAGNOSIS — M48061 Spinal stenosis, lumbar region without neurogenic claudication: Secondary | ICD-10-CM | POA: Diagnosis not present

## 2019-11-21 MED ORDER — IOPAMIDOL (ISOVUE-M 300) INJECTION 61%
15.0000 mL | Freq: Once | INTRAMUSCULAR | Status: AC | PRN
  Administered 2019-11-21: 15 mL via INTRATHECAL

## 2019-11-21 MED ORDER — ONDANSETRON HCL 4 MG/2ML IJ SOLN
4.0000 mg | Freq: Once | INTRAMUSCULAR | Status: AC
  Administered 2019-11-21: 4 mg via INTRAMUSCULAR

## 2019-11-21 MED ORDER — MEPERIDINE HCL 50 MG/ML IJ SOLN
50.0000 mg | Freq: Once | INTRAMUSCULAR | Status: AC
  Administered 2019-11-21: 14:00:00 50 mg via INTRAMUSCULAR

## 2019-11-21 MED ORDER — DIAZEPAM 5 MG PO TABS
5.0000 mg | ORAL_TABLET | Freq: Once | ORAL | Status: AC
  Administered 2019-11-21: 13:00:00 5 mg via ORAL

## 2019-11-21 NOTE — Progress Notes (Signed)
Patient states she has been off Effexor and Wellbutrin for at least the past two days.

## 2019-11-21 NOTE — Discharge Instructions (Signed)
Myelogram Discharge Instructions  1. Go home and rest quietly for the next 24 hours.  It is important to lie flat for the next 24 hours.  Get up only to go to the restroom.  You may lie in the bed or on a couch on your back, your stomach, your left side or your right side.  You may have one pillow under your head.  You may have pillows between your knees while you are on your side or under your knees while you are on your back.  2. DO NOT drive today.  Recline the seat as far back as it will go, while still wearing your seat belt, on the way home.  3. You may get up to go to the bathroom as needed.  You may sit up for 10 minutes to eat.  You may resume your normal diet and medications unless otherwise indicated.  Drink lots of extra fluids today and tomorrow.  4. The incidence of headache, nausea, or vomiting is about 5% (one in 20 patients).  If you develop a headache, lie flat and drink plenty of fluids until the headache goes away.  Caffeinated beverages may be helpful.  If you develop severe nausea and vomiting or a headache that does not go away with flat bed rest, call [phone].  5. You may resume normal activities after your 24 hours of bed rest is over; however, do not exert yourself strongly or do any heavy lifting tomorrow. If when you get up you have a headache when standing, go back to bed and force fluids for another 24 hours.  6. Call your physician for a follow-up appointment.  The results of your myelogram will be sent directly to your physician by the following day.  7. If you have any questions or if complications develop after you arrive home, please call [phone].  Discharge instructions have been explained to the patient.  The patient, or the person responsible for the patient, fully understands these instructions.  YOU MAY RESTART YOUR [facility] AND [facility] TOMORROW 11/22/19 AT 1:00PM.

## 2019-11-24 ENCOUNTER — Ambulatory Visit (INDEPENDENT_AMBULATORY_CARE_PROVIDER_SITE_OTHER): Payer: Medicare Other | Admitting: *Deleted

## 2019-11-24 ENCOUNTER — Telehealth: Payer: Self-pay

## 2019-11-24 DIAGNOSIS — I5022 Chronic systolic (congestive) heart failure: Secondary | ICD-10-CM

## 2019-11-24 LAB — CUP PACEART REMOTE DEVICE CHECK
Battery Remaining Longevity: 2 mo
Battery Voltage: 2.74 V
Brady Statistic AP VP Percent: 0.04 %
Brady Statistic AP VS Percent: 0.01 %
Brady Statistic AS VP Percent: 98.73 %
Brady Statistic AS VS Percent: 1.23 %
Brady Statistic RA Percent Paced: 0.05 %
Brady Statistic RV Percent Paced: 22.06 %
Date Time Interrogation Session: 20210312033623
HighPow Impedance: 70 Ohm
Implantable Lead Implant Date: 20060214
Implantable Lead Implant Date: 20150406
Implantable Lead Implant Date: 20150406
Implantable Lead Location: 753858
Implantable Lead Location: 753859
Implantable Lead Location: 753860
Implantable Lead Model: 4396
Implantable Lead Model: 5076
Implantable Pulse Generator Implant Date: 20150406
Lead Channel Impedance Value: 342 Ohm
Lead Channel Impedance Value: 399 Ohm
Lead Channel Impedance Value: 418 Ohm
Lead Channel Impedance Value: 513 Ohm
Lead Channel Impedance Value: 703 Ohm
Lead Channel Impedance Value: 779 Ohm
Lead Channel Pacing Threshold Amplitude: 0.875 V
Lead Channel Pacing Threshold Amplitude: 1 V
Lead Channel Pacing Threshold Amplitude: 1.5 V
Lead Channel Pacing Threshold Pulse Width: 0.4 ms
Lead Channel Pacing Threshold Pulse Width: 0.4 ms
Lead Channel Pacing Threshold Pulse Width: 0.6 ms
Lead Channel Sensing Intrinsic Amplitude: 16.75 mV
Lead Channel Sensing Intrinsic Amplitude: 16.75 mV
Lead Channel Sensing Intrinsic Amplitude: 2.375 mV
Lead Channel Sensing Intrinsic Amplitude: 2.375 mV
Lead Channel Setting Pacing Amplitude: 2 V
Lead Channel Setting Pacing Amplitude: 2.5 V
Lead Channel Setting Pacing Amplitude: 2.75 V
Lead Channel Setting Pacing Pulse Width: 0.4 ms
Lead Channel Setting Pacing Pulse Width: 0.6 ms
Lead Channel Setting Sensing Sensitivity: 0.3 mV

## 2019-11-24 NOTE — Telephone Encounter (Signed)
Pt states she had a remote visit this morning.  She does not understand the remote message.  Please call [phone]  Thanks renee

## 2019-11-24 NOTE — Progress Notes (Signed)
ICD Remote  

## 2019-11-24 NOTE — Telephone Encounter (Signed)
Device clinic called pt, not cardiology, will forward

## 2019-11-25 ENCOUNTER — Ambulatory Visit: Payer: Medicare Other | Attending: Internal Medicine

## 2019-11-25 DIAGNOSIS — Z23 Encounter for immunization: Secondary | ICD-10-CM

## 2019-11-25 NOTE — Progress Notes (Signed)
   Covid-19 Vaccination Clinic  Name:  [name]    MRN: [id number] DOB: [birth year]  11/25/2019  [name] was observed post Covid-19 immunization for 15 minutes without incident. She was provided with Vaccine Information Sheet and instruction to access the V-Safe system.   [name] was instructed to call 911 with any severe reactions post vaccine: [name] Difficulty breathing  . Swelling of face and throat  . A fast heartbeat  . A bad rash all over body  . Dizziness and weakness   Immunizations Administered    Name Date Dose VIS Date Route   Pfizer COVID-19 Vaccine 11/25/2019 11:34 AM 0.3 mL 08/25/2019 Intramuscular   Manufacturer: [employer]   Lot: [id number]   [place]: [id number]

## 2019-11-27 NOTE — Addendum Note (Signed)
Addended by: Douglass Rivers D on: 11/27/2019 12:50 PM   Modules accepted: Level of Service

## 2019-11-27 NOTE — Telephone Encounter (Signed)
Spoke with pt, advised remote transmission received on 3/12, normal device function.  Currently on mothly checks due to battery at [facility].  Pt questioned notation on report about fluid accumulation.  Advised this has stabilized by time of report.  Increase fluid retention can occur if increase in sodium in diet or changes in diuretic.  Pt indicates she did recently have a change in furosemide dosage.

## 2019-11-28 DIAGNOSIS — Z7189 Other specified counseling: Secondary | ICD-10-CM | POA: Diagnosis not present

## 2019-11-28 DIAGNOSIS — E785 Hyperlipidemia, unspecified: Secondary | ICD-10-CM | POA: Diagnosis not present

## 2019-11-28 DIAGNOSIS — I429 Cardiomyopathy, unspecified: Secondary | ICD-10-CM | POA: Diagnosis not present

## 2019-11-28 DIAGNOSIS — N184 Chronic kidney disease, stage 4 (severe): Secondary | ICD-10-CM | POA: Diagnosis not present

## 2019-11-28 DIAGNOSIS — R945 Abnormal results of liver function studies: Secondary | ICD-10-CM | POA: Diagnosis not present

## 2019-11-28 DIAGNOSIS — E0821 Diabetes mellitus due to underlying condition with diabetic nephropathy: Secondary | ICD-10-CM | POA: Diagnosis not present

## 2019-11-28 DIAGNOSIS — Z6823 Body mass index (BMI) 23.0-23.9, adult: Secondary | ICD-10-CM | POA: Diagnosis not present

## 2019-11-28 DIAGNOSIS — E1140 Type 2 diabetes mellitus with diabetic neuropathy, unspecified: Secondary | ICD-10-CM | POA: Diagnosis not present

## 2019-11-28 DIAGNOSIS — E109 Type 1 diabetes mellitus without complications: Secondary | ICD-10-CM | POA: Diagnosis not present

## 2019-11-28 DIAGNOSIS — E139 Other specified diabetes mellitus without complications: Secondary | ICD-10-CM | POA: Diagnosis not present

## 2019-11-30 DIAGNOSIS — Z79899 Other long term (current) drug therapy: Secondary | ICD-10-CM | POA: Diagnosis not present

## 2019-11-30 DIAGNOSIS — M48062 Spinal stenosis, lumbar region with neurogenic claudication: Secondary | ICD-10-CM | POA: Diagnosis not present

## 2019-11-30 DIAGNOSIS — F411 Generalized anxiety disorder: Secondary | ICD-10-CM | POA: Diagnosis not present

## 2019-11-30 DIAGNOSIS — M961 Postlaminectomy syndrome, not elsewhere classified: Secondary | ICD-10-CM | POA: Diagnosis not present

## 2019-12-01 DIAGNOSIS — M4326 Fusion of spine, lumbar region: Secondary | ICD-10-CM | POA: Diagnosis not present

## 2019-12-01 DIAGNOSIS — Z6822 Body mass index (BMI) 22.0-22.9, adult: Secondary | ICD-10-CM | POA: Diagnosis not present

## 2019-12-01 DIAGNOSIS — M4716 Other spondylosis with myelopathy, lumbar region: Secondary | ICD-10-CM | POA: Diagnosis not present

## 2019-12-01 DIAGNOSIS — M4316 Spondylolisthesis, lumbar region: Secondary | ICD-10-CM | POA: Diagnosis not present

## 2019-12-08 DIAGNOSIS — M4316 Spondylolisthesis, lumbar region: Secondary | ICD-10-CM | POA: Diagnosis not present

## 2019-12-08 DIAGNOSIS — M5417 Radiculopathy, lumbosacral region: Secondary | ICD-10-CM | POA: Diagnosis not present

## 2019-12-08 DIAGNOSIS — M4326 Fusion of spine, lumbar region: Secondary | ICD-10-CM | POA: Diagnosis not present

## 2019-12-11 DIAGNOSIS — M5417 Radiculopathy, lumbosacral region: Secondary | ICD-10-CM | POA: Diagnosis not present

## 2019-12-25 ENCOUNTER — Ambulatory Visit (INDEPENDENT_AMBULATORY_CARE_PROVIDER_SITE_OTHER): Payer: Medicare Other | Admitting: *Deleted

## 2019-12-25 DIAGNOSIS — I5022 Chronic systolic (congestive) heart failure: Secondary | ICD-10-CM

## 2019-12-25 LAB — CUP PACEART REMOTE DEVICE CHECK
Battery Remaining Longevity: 2 mo
Battery Voltage: 2.73 V
Brady Statistic AP VP Percent: 0.03 %
Brady Statistic AP VS Percent: 0.01 %
Brady Statistic AS VP Percent: 98.61 %
Brady Statistic AS VS Percent: 1.35 %
Brady Statistic RA Percent Paced: 0.04 %
Brady Statistic RV Percent Paced: 16.46 %
Date Time Interrogation Session: 20210412033323
HighPow Impedance: 74 Ohm
Implantable Lead Implant Date: 20060214
Implantable Lead Implant Date: 20150406
Implantable Lead Implant Date: 20150406
Implantable Lead Location: 753858
Implantable Lead Location: 753859
Implantable Lead Location: 753860
Implantable Lead Model: 4396
Implantable Lead Model: 5076
Implantable Pulse Generator Implant Date: 20150406
Lead Channel Impedance Value: 342 Ohm
Lead Channel Impedance Value: 399 Ohm
Lead Channel Impedance Value: 418 Ohm
Lead Channel Impedance Value: 532 Ohm
Lead Channel Impedance Value: 665 Ohm
Lead Channel Impedance Value: 779 Ohm
Lead Channel Pacing Threshold Amplitude: 0.875 V
Lead Channel Pacing Threshold Amplitude: 0.875 V
Lead Channel Pacing Threshold Amplitude: 1.75 V
Lead Channel Pacing Threshold Pulse Width: 0.4 ms
Lead Channel Pacing Threshold Pulse Width: 0.4 ms
Lead Channel Pacing Threshold Pulse Width: 0.6 ms
Lead Channel Sensing Intrinsic Amplitude: 18.875 mV
Lead Channel Sensing Intrinsic Amplitude: 18.875 mV
Lead Channel Sensing Intrinsic Amplitude: 2.625 mV
Lead Channel Sensing Intrinsic Amplitude: 2.625 mV
Lead Channel Setting Pacing Amplitude: 2 V
Lead Channel Setting Pacing Amplitude: 2.5 V
Lead Channel Setting Pacing Amplitude: 2.75 V
Lead Channel Setting Pacing Pulse Width: 0.4 ms
Lead Channel Setting Pacing Pulse Width: 0.6 ms
Lead Channel Setting Sensing Sensitivity: 0.3 mV

## 2019-12-26 ENCOUNTER — Ambulatory Visit: Payer: Medicare Other | Admitting: Family Medicine

## 2019-12-28 DIAGNOSIS — M4326 Fusion of spine, lumbar region: Secondary | ICD-10-CM | POA: Diagnosis not present

## 2019-12-28 DIAGNOSIS — M7062 Trochanteric bursitis, left hip: Secondary | ICD-10-CM | POA: Diagnosis not present

## 2019-12-28 DIAGNOSIS — M5417 Radiculopathy, lumbosacral region: Secondary | ICD-10-CM | POA: Diagnosis not present

## 2019-12-31 DIAGNOSIS — Z79899 Other long term (current) drug therapy: Secondary | ICD-10-CM | POA: Diagnosis not present

## 2019-12-31 DIAGNOSIS — M48062 Spinal stenosis, lumbar region with neurogenic claudication: Secondary | ICD-10-CM | POA: Diagnosis not present

## 2019-12-31 DIAGNOSIS — M961 Postlaminectomy syndrome, not elsewhere classified: Secondary | ICD-10-CM | POA: Diagnosis not present

## 2020-01-08 ENCOUNTER — Telehealth: Payer: Self-pay | Admitting: Internal Medicine

## 2020-01-08 NOTE — Telephone Encounter (Signed)
Per phone call from pt-- for the past few mornings her defib has been going off at 8am, it sounds like an alarm and it's waking her up   Please give pt a call @ [phone]

## 2020-01-08 NOTE — Telephone Encounter (Signed)
Per phone call from pt-- for the past few mornings her defib has been going off at 8am, it sounds like an alarm and it's waking her up   Please give pt a call @ [phone]

## 2020-01-08 NOTE — Telephone Encounter (Signed)
Transmission from 01/06/20 at 05:15 reviewed. ICD at RRT as of 01/06/20. Otherwise normal device function.  Spoke with patient to advise of RRT status. Scheduled with Dr. Lovena Le in [address] on 01/11/20 at 11:00am to discuss gen change and to turn off RRT alert. Advised pt that if alert starts sounding more than once daily, to call back, but otherwise she can ignore alert. Pt verbalizes understanding and agreement with plan. Pt denies additional questions or concerns at this time.

## 2020-01-09 DIAGNOSIS — R945 Abnormal results of liver function studies: Secondary | ICD-10-CM | POA: Diagnosis not present

## 2020-01-09 DIAGNOSIS — E109 Type 1 diabetes mellitus without complications: Secondary | ICD-10-CM | POA: Diagnosis not present

## 2020-01-09 DIAGNOSIS — Z6823 Body mass index (BMI) 23.0-23.9, adult: Secondary | ICD-10-CM | POA: Diagnosis not present

## 2020-01-09 DIAGNOSIS — E0821 Diabetes mellitus due to underlying condition with diabetic nephropathy: Secondary | ICD-10-CM | POA: Diagnosis not present

## 2020-01-09 DIAGNOSIS — I429 Cardiomyopathy, unspecified: Secondary | ICD-10-CM | POA: Diagnosis not present

## 2020-01-09 DIAGNOSIS — E785 Hyperlipidemia, unspecified: Secondary | ICD-10-CM | POA: Diagnosis not present

## 2020-01-09 DIAGNOSIS — E1140 Type 2 diabetes mellitus with diabetic neuropathy, unspecified: Secondary | ICD-10-CM | POA: Diagnosis not present

## 2020-01-09 DIAGNOSIS — N184 Chronic kidney disease, stage 4 (severe): Secondary | ICD-10-CM | POA: Diagnosis not present

## 2020-01-09 DIAGNOSIS — E139 Other specified diabetes mellitus without complications: Secondary | ICD-10-CM | POA: Diagnosis not present

## 2020-01-09 DIAGNOSIS — Z7189 Other specified counseling: Secondary | ICD-10-CM | POA: Diagnosis not present

## 2020-01-11 ENCOUNTER — Encounter: Payer: Self-pay | Admitting: Internal Medicine

## 2020-01-11 ENCOUNTER — Other Ambulatory Visit: Payer: Self-pay

## 2020-01-11 ENCOUNTER — Ambulatory Visit (INDEPENDENT_AMBULATORY_CARE_PROVIDER_SITE_OTHER): Payer: Medicare Other | Admitting: Internal Medicine

## 2020-01-11 VITALS — BP 140/76 | HR 83 | Temp 97.7°F | Ht 61.0 in | Wt 122.4 lb

## 2020-01-11 DIAGNOSIS — Z95810 Presence of automatic (implantable) cardiac defibrillator: Secondary | ICD-10-CM | POA: Diagnosis not present

## 2020-01-11 DIAGNOSIS — I5042 Chronic combined systolic (congestive) and diastolic (congestive) heart failure: Secondary | ICD-10-CM

## 2020-01-11 DIAGNOSIS — Z01818 Encounter for other preprocedural examination: Secondary | ICD-10-CM | POA: Diagnosis not present

## 2020-01-11 LAB — CUP PACEART INCLINIC DEVICE CHECK
Date Time Interrogation Session: 20210429110720
Implantable Lead Implant Date: 20060214
Implantable Lead Implant Date: 20150406
Implantable Lead Implant Date: 20150406
Implantable Lead Location: 753858
Implantable Lead Location: 753859
Implantable Lead Location: 753860
Implantable Lead Model: 4396
Implantable Lead Model: 5076
Implantable Pulse Generator Implant Date: 20150406
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
## 2020-01-11 NOTE — Patient Instructions (Signed)
Medication Instructions:  Your physician recommends that you continue on your current medications as directed. Please refer to the Current Medication list given to you today.  *If you need a refill on your cardiac medications before your next appointment, please call your pharmacy*   Lab Work: Your physician recommends that you return for lab work in: [place] , BMET   If you have labs (blood work) drawn today and your tests are completely normal, you will receive your results only by: [name] MyChart Message (if you have MyChart) OR . A paper copy in the mail If you have any lab test that is abnormal or we need to change your treatment, we will call you to review the results.   Testing/Procedures: Your physician has recommended that you have a defibrillator inserted. An implantable cardioverter defibrillator (ICD) is a small device that is placed in your chest or, in rare cases, your abdomen. This device uses electrical pulses or shocks to help control life-threatening, irregular heartbeats that could lead the heart to suddenly stop beating (sudden cardiac arrest). Leads are attached to the ICD that goes into your heart. This is done in the hospital and usually requires an overnight stay. Please see the instruction sheet given to you today for more information.     Follow-Up: At [facility], you and your health needs are our priority.  As part of our continuing mission to provide you with exceptional heart care, we have created designated Provider Care Teams.  These Care Teams include your primary Cardiologist (physician) and Advanced Practice Providers (APPs -  Physician Assistants and Nurse Practitioners) who all work together to provide you with the care you need, when you need it.  We recommend signing up for the patient portal called "MyChart".  Sign up information is provided on this After Visit Summary.  MyChart is used to connect with patients for Virtual Visits (Telemedicine).  Patients are  able to view lab/test results, encounter notes, upcoming appointments, etc.  Non-urgent messages can be sent to your provider as well.   To learn more about what you can do with MyChart, go to [email or web address].    Your next appointment:    ICD Gen Change   The format for your next appointment:   In Person  Provider:   Cristopher Peru, MD   Other Instructions Thank you for choosing [facility]!

## 2020-01-16 ENCOUNTER — Other Ambulatory Visit (HOSPITAL_COMMUNITY): Payer: 59

## 2020-01-16 DIAGNOSIS — M5417 Radiculopathy, lumbosacral region: Secondary | ICD-10-CM | POA: Diagnosis not present

## 2020-01-18 ENCOUNTER — Other Ambulatory Visit: Payer: Self-pay

## 2020-01-18 ENCOUNTER — Ambulatory Visit (HOSPITAL_COMMUNITY)
Admission: RE | Admit: 2020-01-18 | Discharge: 2020-01-18 | Disposition: A | Discharge status: Home / Self Care | Payer: Medicare Other | Source: Ambulatory Visit | Attending: Internal Medicine | Admitting: Internal Medicine

## 2020-01-18 DIAGNOSIS — Z95810 Presence of automatic (implantable) cardiac defibrillator: Secondary | ICD-10-CM | POA: Diagnosis not present

## 2020-01-18 DIAGNOSIS — I5042 Chronic combined systolic (congestive) and diastolic (congestive) heart failure: Secondary | ICD-10-CM | POA: Insufficient documentation

## 2020-01-18 NOTE — Progress Notes (Signed)
*  PRELIMINARY RESULTS* Echocardiogram 2D Echocardiogram has been performed.  [name] 01/18/2020, 2:56 PM

## 2020-01-23 DIAGNOSIS — E0822 Diabetes mellitus due to underlying condition with diabetic chronic kidney disease: Secondary | ICD-10-CM | POA: Diagnosis not present

## 2020-01-23 DIAGNOSIS — M545 Low back pain: Secondary | ICD-10-CM | POA: Diagnosis not present

## 2020-01-23 DIAGNOSIS — K219 Gastro-esophageal reflux disease without esophagitis: Secondary | ICD-10-CM | POA: Diagnosis not present

## 2020-01-23 DIAGNOSIS — I4891 Unspecified atrial fibrillation: Secondary | ICD-10-CM | POA: Diagnosis not present

## 2020-01-23 DIAGNOSIS — F5101 Primary insomnia: Secondary | ICD-10-CM | POA: Diagnosis not present

## 2020-01-23 DIAGNOSIS — F411 Generalized anxiety disorder: Secondary | ICD-10-CM | POA: Diagnosis not present

## 2020-01-23 DIAGNOSIS — E782 Mixed hyperlipidemia: Secondary | ICD-10-CM | POA: Diagnosis not present

## 2020-01-23 DIAGNOSIS — M79673 Pain in unspecified foot: Secondary | ICD-10-CM | POA: Diagnosis not present

## 2020-01-23 DIAGNOSIS — Z0189 Encounter for other specified special examinations: Secondary | ICD-10-CM | POA: Diagnosis not present

## 2020-01-24 ENCOUNTER — Ambulatory Visit (INDEPENDENT_AMBULATORY_CARE_PROVIDER_SITE_OTHER): Payer: Medicare Other | Admitting: *Deleted

## 2020-01-24 DIAGNOSIS — E109 Type 1 diabetes mellitus without complications: Secondary | ICD-10-CM | POA: Diagnosis not present

## 2020-01-24 DIAGNOSIS — I429 Cardiomyopathy, unspecified: Secondary | ICD-10-CM | POA: Diagnosis not present

## 2020-01-24 DIAGNOSIS — E1140 Type 2 diabetes mellitus with diabetic neuropathy, unspecified: Secondary | ICD-10-CM | POA: Diagnosis not present

## 2020-01-24 DIAGNOSIS — I5022 Chronic systolic (congestive) heart failure: Secondary | ICD-10-CM

## 2020-01-24 DIAGNOSIS — N184 Chronic kidney disease, stage 4 (severe): Secondary | ICD-10-CM | POA: Diagnosis not present

## 2020-01-24 DIAGNOSIS — E0821 Diabetes mellitus due to underlying condition with diabetic nephropathy: Secondary | ICD-10-CM | POA: Diagnosis not present

## 2020-01-24 DIAGNOSIS — Z6823 Body mass index (BMI) 23.0-23.9, adult: Secondary | ICD-10-CM | POA: Diagnosis not present

## 2020-01-24 DIAGNOSIS — E785 Hyperlipidemia, unspecified: Secondary | ICD-10-CM | POA: Diagnosis not present

## 2020-01-24 DIAGNOSIS — Z7189 Other specified counseling: Secondary | ICD-10-CM | POA: Diagnosis not present

## 2020-01-24 DIAGNOSIS — R945 Abnormal results of liver function studies: Secondary | ICD-10-CM | POA: Diagnosis not present

## 2020-01-24 DIAGNOSIS — E139 Other specified diabetes mellitus without complications: Secondary | ICD-10-CM | POA: Diagnosis not present

## 2020-01-24 DIAGNOSIS — I255 Ischemic cardiomyopathy: Secondary | ICD-10-CM

## 2020-01-24 LAB — CUP PACEART REMOTE DEVICE CHECK
Battery Remaining Longevity: 1 mo
Battery Voltage: 2.7 V
Brady Statistic AP VP Percent: 0.02 %
Brady Statistic AP VS Percent: 0.01 %
Brady Statistic AS VP Percent: 98.67 %
Brady Statistic AS VS Percent: 1.31 %
Brady Statistic RA Percent Paced: 0.02 %
Brady Statistic RV Percent Paced: 31.4 %
Date Time Interrogation Session: 20210512001603
HighPow Impedance: 69 Ohm
Implantable Lead Implant Date: 20060214
Implantable Lead Implant Date: 20150406
Implantable Lead Implant Date: 20150406
Implantable Lead Location: 753858
Implantable Lead Location: 753859
Implantable Lead Location: 753860
Implantable Lead Model: 4396
Implantable Lead Model: 5076
Implantable Pulse Generator Implant Date: 20150406
Lead Channel Impedance Value: 342 Ohm
Lead Channel Impedance Value: 361 Ohm
Lead Channel Impedance Value: 418 Ohm
Lead Channel Impedance Value: 475 Ohm
Lead Channel Impedance Value: 703 Ohm
Lead Channel Impedance Value: 760 Ohm
Lead Channel Pacing Threshold Amplitude: 0.875 V
Lead Channel Pacing Threshold Amplitude: 0.875 V
Lead Channel Pacing Threshold Amplitude: 1.5 V
Lead Channel Pacing Threshold Pulse Width: 0.4 ms
Lead Channel Pacing Threshold Pulse Width: 0.4 ms
Lead Channel Pacing Threshold Pulse Width: 0.6 ms
Lead Channel Sensing Intrinsic Amplitude: 15.5 mV
Lead Channel Sensing Intrinsic Amplitude: 15.5 mV
Lead Channel Sensing Intrinsic Amplitude: 2.5 mV
Lead Channel Sensing Intrinsic Amplitude: 2.5 mV
Lead Channel Setting Pacing Amplitude: 2 V
Lead Channel Setting Pacing Amplitude: 2.5 V
Lead Channel Setting Pacing Amplitude: 2.5 V
Lead Channel Setting Pacing Pulse Width: 0.4 ms
Lead Channel Setting Pacing Pulse Width: 0.6 ms
Lead Channel Setting Sensing Sensitivity: 0.3 mV

## 2020-01-25 ENCOUNTER — Encounter: Payer: Self-pay | Admitting: *Deleted

## 2020-01-25 DIAGNOSIS — N1832 Chronic kidney disease, stage 3b: Secondary | ICD-10-CM | POA: Diagnosis not present

## 2020-01-25 DIAGNOSIS — N189 Chronic kidney disease, unspecified: Secondary | ICD-10-CM | POA: Diagnosis not present

## 2020-01-25 DIAGNOSIS — Z79899 Other long term (current) drug therapy: Secondary | ICD-10-CM | POA: Diagnosis not present

## 2020-01-25 DIAGNOSIS — E1122 Type 2 diabetes mellitus with diabetic chronic kidney disease: Secondary | ICD-10-CM | POA: Diagnosis not present

## 2020-01-25 DIAGNOSIS — D631 Anemia in chronic kidney disease: Secondary | ICD-10-CM | POA: Diagnosis not present

## 2020-01-25 DIAGNOSIS — R809 Proteinuria, unspecified: Secondary | ICD-10-CM | POA: Diagnosis not present

## 2020-01-25 DIAGNOSIS — E559 Vitamin D deficiency, unspecified: Secondary | ICD-10-CM | POA: Diagnosis not present

## 2020-01-25 DIAGNOSIS — I5042 Chronic combined systolic (congestive) and diastolic (congestive) heart failure: Secondary | ICD-10-CM | POA: Diagnosis not present

## 2020-01-25 NOTE — Progress Notes (Signed)
Remote ICD transmission.   

## 2020-01-26 NOTE — Addendum Note (Signed)
Addended by: Levonne Hubert on: 01/26/2020 02:19 PM   Modules accepted: Orders

## 2020-01-26 NOTE — Addendum Note (Signed)
Addended by: Levonne Hubert on: 01/26/2020 02:08 PM   Modules accepted: Orders

## 2020-01-29 ENCOUNTER — Other Ambulatory Visit: Payer: Self-pay | Admitting: Family Medicine

## 2020-01-29 NOTE — Telephone Encounter (Signed)
Please advise,

## 2020-02-01 DIAGNOSIS — Z79899 Other long term (current) drug therapy: Secondary | ICD-10-CM | POA: Diagnosis not present

## 2020-02-01 DIAGNOSIS — M48062 Spinal stenosis, lumbar region with neurogenic claudication: Secondary | ICD-10-CM | POA: Diagnosis not present

## 2020-02-01 DIAGNOSIS — M961 Postlaminectomy syndrome, not elsewhere classified: Secondary | ICD-10-CM | POA: Diagnosis not present

## 2020-02-01 DIAGNOSIS — F411 Generalized anxiety disorder: Secondary | ICD-10-CM | POA: Diagnosis not present

## 2020-02-02 DIAGNOSIS — E1129 Type 2 diabetes mellitus with other diabetic kidney complication: Secondary | ICD-10-CM | POA: Diagnosis not present

## 2020-02-02 DIAGNOSIS — E211 Secondary hyperparathyroidism, not elsewhere classified: Secondary | ICD-10-CM | POA: Diagnosis not present

## 2020-02-02 DIAGNOSIS — N178 Other acute kidney failure: Secondary | ICD-10-CM | POA: Diagnosis not present

## 2020-02-02 DIAGNOSIS — N189 Chronic kidney disease, unspecified: Secondary | ICD-10-CM | POA: Diagnosis not present

## 2020-02-02 DIAGNOSIS — I5042 Chronic combined systolic (congestive) and diastolic (congestive) heart failure: Secondary | ICD-10-CM | POA: Diagnosis not present

## 2020-02-02 DIAGNOSIS — E1122 Type 2 diabetes mellitus with diabetic chronic kidney disease: Secondary | ICD-10-CM | POA: Diagnosis not present

## 2020-02-02 DIAGNOSIS — R809 Proteinuria, unspecified: Secondary | ICD-10-CM | POA: Diagnosis not present

## 2020-02-02 DIAGNOSIS — D638 Anemia in other chronic diseases classified elsewhere: Secondary | ICD-10-CM | POA: Diagnosis not present

## 2020-02-05 ENCOUNTER — Telehealth: Payer: Self-pay | Admitting: Internal Medicine

## 2020-02-05 NOTE — Telephone Encounter (Signed)
Pt called stating defib went off on Saturday

## 2020-02-05 NOTE — Telephone Encounter (Addendum)
Patient reports she was sleeping and woke up when here ICD "went Off". She reports heard a noise but felt no shock. She heard a noise and says it sounded like an alarm clock. Patient denies s/sx except feeling tired today.Remote transmission sent by patient. No alerts or arrhythmias on transmission. Patient scheduled to have gen change on 02/22/20. Patient reassured that her ICD has not treated her. Will call office if she has any change in condition. Shock plan reviewed.

## 2020-02-06 DIAGNOSIS — M7062 Trochanteric bursitis, left hip: Secondary | ICD-10-CM | POA: Diagnosis not present

## 2020-02-06 DIAGNOSIS — M4316 Spondylolisthesis, lumbar region: Secondary | ICD-10-CM | POA: Diagnosis not present

## 2020-02-06 DIAGNOSIS — M4716 Other spondylosis with myelopathy, lumbar region: Secondary | ICD-10-CM | POA: Diagnosis not present

## 2020-02-06 DIAGNOSIS — M4326 Fusion of spine, lumbar region: Secondary | ICD-10-CM | POA: Diagnosis not present

## 2020-02-06 DIAGNOSIS — M545 Low back pain: Secondary | ICD-10-CM | POA: Diagnosis not present

## 2020-02-06 DIAGNOSIS — M5417 Radiculopathy, lumbosacral region: Secondary | ICD-10-CM | POA: Diagnosis not present

## 2020-02-07 DIAGNOSIS — E0822 Diabetes mellitus due to underlying condition with diabetic chronic kidney disease: Secondary | ICD-10-CM | POA: Diagnosis not present

## 2020-02-07 DIAGNOSIS — Z0189 Encounter for other specified special examinations: Secondary | ICD-10-CM | POA: Diagnosis not present

## 2020-02-07 DIAGNOSIS — E782 Mixed hyperlipidemia: Secondary | ICD-10-CM | POA: Diagnosis not present

## 2020-02-08 ENCOUNTER — Telehealth: Payer: Self-pay | Admitting: "Endocrinology

## 2020-02-08 NOTE — Telephone Encounter (Signed)
Left a message requesting a return call to the office. 

## 2020-02-08 NOTE — Telephone Encounter (Signed)
[facility] left a VM that the pt is trying to get her [place] [facility]. They need the date of last appt patient has not been here since 08/2018. Call back @ [phone] ext [phone] reference number [id number]

## 2020-02-13 DIAGNOSIS — M79673 Pain in unspecified foot: Secondary | ICD-10-CM | POA: Diagnosis not present

## 2020-02-13 DIAGNOSIS — I4891 Unspecified atrial fibrillation: Secondary | ICD-10-CM | POA: Diagnosis not present

## 2020-02-13 DIAGNOSIS — R945 Abnormal results of liver function studies: Secondary | ICD-10-CM | POA: Diagnosis not present

## 2020-02-13 DIAGNOSIS — M545 Low back pain: Secondary | ICD-10-CM | POA: Diagnosis not present

## 2020-02-13 DIAGNOSIS — E782 Mixed hyperlipidemia: Secondary | ICD-10-CM | POA: Diagnosis not present

## 2020-02-13 DIAGNOSIS — F5101 Primary insomnia: Secondary | ICD-10-CM | POA: Diagnosis not present

## 2020-02-13 DIAGNOSIS — F331 Major depressive disorder, recurrent, moderate: Secondary | ICD-10-CM | POA: Diagnosis not present

## 2020-02-13 DIAGNOSIS — K219 Gastro-esophageal reflux disease without esophagitis: Secondary | ICD-10-CM | POA: Diagnosis not present

## 2020-02-13 DIAGNOSIS — N1832 Chronic kidney disease, stage 3b: Secondary | ICD-10-CM | POA: Diagnosis not present

## 2020-02-13 DIAGNOSIS — F411 Generalized anxiety disorder: Secondary | ICD-10-CM | POA: Diagnosis not present

## 2020-02-13 DIAGNOSIS — E0822 Diabetes mellitus due to underlying condition with diabetic chronic kidney disease: Secondary | ICD-10-CM | POA: Diagnosis not present

## 2020-02-13 NOTE — Telephone Encounter (Signed)
Spoke with Denzil Hughes and verified that the pt has not been here since 08/2018

## 2020-02-14 DIAGNOSIS — M48062 Spinal stenosis, lumbar region with neurogenic claudication: Secondary | ICD-10-CM | POA: Diagnosis not present

## 2020-02-14 DIAGNOSIS — M961 Postlaminectomy syndrome, not elsewhere classified: Secondary | ICD-10-CM | POA: Diagnosis not present

## 2020-02-14 DIAGNOSIS — M4716 Other spondylosis with myelopathy, lumbar region: Secondary | ICD-10-CM | POA: Diagnosis not present

## 2020-02-14 DIAGNOSIS — M4316 Spondylolisthesis, lumbar region: Secondary | ICD-10-CM | POA: Diagnosis not present

## 2020-02-15 ENCOUNTER — Telehealth: Payer: Self-pay

## 2020-02-15 NOTE — Telephone Encounter (Signed)
   [facility] Pre-operative Risk Assessment    HEARTCARE STAFF: - Please ensure there is not already an duplicate clearance open for this procedure. - Under Visit Info/Reason for Call, type in Other and utilize the format Clearance MM/DD/YY or Clearance TBD. Do not use dashes or single digits. - If request is for dental extraction, please clarify the # of teeth to be extracted.  Request for surgical clearance:  1. What type of surgery is being performed? Lumbar    2. When is this surgery scheduled?  TBD   3. What type of clearance is required (medical clearance vs. Pharmacy clearance to hold med vs. Both)? Both   4. Are there any medications that need to be held prior to surgery and how long? Aspirin 7 days prior    5. Practice name and name of physician performing surgery? [facility]  6. What is the office phone number? [phone]   7.   What is the office fax number? [phone] Attn: Surgery department   8.   Anesthesia type (None, local, MAC, general) ? None listed    [name] 02/15/2020, 4:30 PM  _________________________________________________________________   (provider comments below)

## 2020-02-16 NOTE — Telephone Encounter (Signed)
   Primary Cardiologist: Cristopher Peru, MD  Chart reviewed as part of pre-operative protocol coverage. Patient chart was reviewed 02/16/2020 in reference to pre-operative risk assessment for pending surgery as outlined below.  RILDA BULLS was last seen on 01/11/20 by Dr. Lovena Le.  At this visit it was noted that her device was at [facility]. Follow up echo with improved EF and now scheduled for generator change out on 02/22/20 with Dr. Lovena Le. Will need to defer surgery until after this is completed and seen in follow up.    Pre-op covering staff: - Please schedule appointment and call patient to inform them that surgery will need to be deferred until after generator change out and follow up.   I will contact requesting surgeon's office via preferred method (i.e, phone, fax) to inform them of need for generator change out and follow up prior to surgery.  Reino Bellis, NP 02/16/2020, 10:33 AM

## 2020-02-19 ENCOUNTER — Telehealth: Payer: Self-pay | Admitting: Internal Medicine

## 2020-02-19 ENCOUNTER — Other Ambulatory Visit (HOSPITAL_COMMUNITY)
Admission: RE | Admit: 2020-02-19 | Discharge: 2020-02-19 | Disposition: A | Discharge status: Home / Self Care | Payer: Medicare Other | Source: Ambulatory Visit | Attending: Internal Medicine | Admitting: Internal Medicine

## 2020-02-19 DIAGNOSIS — Z01812 Encounter for preprocedural laboratory examination: Secondary | ICD-10-CM | POA: Insufficient documentation

## 2020-02-19 DIAGNOSIS — I5042 Chronic combined systolic (congestive) and diastolic (congestive) heart failure: Secondary | ICD-10-CM | POA: Diagnosis not present

## 2020-02-19 DIAGNOSIS — Z20822 Contact with and (suspected) exposure to covid-19: Secondary | ICD-10-CM | POA: Insufficient documentation

## 2020-02-19 DIAGNOSIS — Z01818 Encounter for other preprocedural examination: Secondary | ICD-10-CM | POA: Diagnosis not present

## 2020-02-19 LAB — SARS CORONAVIRUS 2 (TAT 6-24 HRS): SARS Coronavirus 2: NEGATIVE

## 2020-02-19 NOTE — Telephone Encounter (Signed)
New message   Pt has question about her procedure with Dr. Lovena Le.

## 2020-02-20 ENCOUNTER — Telehealth: Payer: Self-pay | Admitting: Internal Medicine

## 2020-02-20 LAB — CBC
Hematocrit: 38.5 % (ref 34.0–46.6)
Hemoglobin: 12.1 g/dL (ref 11.1–15.9)
MCH: 30 pg (ref 26.6–33.0)
MCHC: 31.4 g/dL — ABNORMAL LOW (ref 31.5–35.7)
MCV: 96 fL (ref 79–97)
Platelets: 210 10*3/uL (ref 150–450)
RBC: 4.03 x10E6/uL (ref 3.77–5.28)
RDW: 13.1 % (ref 11.7–15.4)
WBC: 7.2 10*3/uL (ref 3.4–10.8)

## 2020-02-20 LAB — BASIC METABOLIC PANEL
BUN/Creatinine Ratio: 20 (ref 12–28)
BUN: 26 mg/dL (ref 8–27)
CO2: 27 mmol/L (ref 20–29)
Calcium: 8.8 mg/dL (ref 8.7–10.3)
Chloride: 102 mmol/L (ref 96–106)
Creatinine, Ser: 1.33 mg/dL — ABNORMAL HIGH (ref 0.57–1.00)
GFR calc Af Amer: 48 mL/min/{1.73_m2} — ABNORMAL LOW (ref >59)
GFR calc non Af Amer: 41 mL/min/{1.73_m2} — ABNORMAL LOW (ref >59)
Glucose: 200 mg/dL — ABNORMAL HIGH (ref 65–99)
Potassium: 4.8 mmol/L (ref 3.5–5.2)
Sodium: 143 mmol/L (ref 134–144)

## 2020-02-20 NOTE — Telephone Encounter (Signed)
Tammy with [facility] returned Brittany's call. Call successfully transferred to [place].

## 2020-02-20 NOTE — Telephone Encounter (Signed)
Spoke with pt. All questions answered. Pt's sister will go with her during procedure and pt will take overnight bag.

## 2020-02-20 NOTE — Telephone Encounter (Signed)
Patient is scheduled for BIV PPM generator change on 02/22/20. Patient is not scheduled with Dr. Lovena Le until 05/28/20 which is the standard 3 month physician follow up post gen change. Spoke with Dr. Lovena Le and he will plan to clear patient 6 weeks after her generator change.   Spoke with Tammy from [facility] to make her aware of the above information. Patient is suppose to have L3-S1 Laminectomy. She states that they will plan to schedule surgery 6 weeks after gen change on 02/22/20, but will call back or re-fax clearance a little closer to time.

## 2020-02-20 NOTE — Telephone Encounter (Signed)
Will remove from pre-op pool for now and wait for office to re-fax clearance form closer to time of surgery.

## 2020-02-21 ENCOUNTER — Telehealth: Payer: Self-pay

## 2020-02-21 NOTE — Telephone Encounter (Signed)
Pt is requesting to be re-instructed before surgery tomorrow   Please call  [phone]  Thanks renee

## 2020-02-21 NOTE — Telephone Encounter (Signed)
Pt calling to ask if she may have supper tonight. Pt informed she is NPO after midnight.

## 2020-02-22 ENCOUNTER — Ambulatory Visit (HOSPITAL_COMMUNITY)
Admission: RE | Disposition: A | Discharge status: Home / Self Care | Payer: Self-pay | Source: Home / Self Care | Attending: Internal Medicine

## 2020-02-22 ENCOUNTER — Encounter (HOSPITAL_COMMUNITY): Payer: Self-pay | Admitting: Internal Medicine

## 2020-02-22 ENCOUNTER — Other Ambulatory Visit: Payer: Self-pay

## 2020-02-22 ENCOUNTER — Ambulatory Visit (HOSPITAL_COMMUNITY)
Admission: RE | Admit: 2020-02-22 | Discharge: 2020-02-22 | Disposition: A | Discharge status: Home / Self Care | Payer: Medicare Other | Attending: Internal Medicine | Admitting: Internal Medicine

## 2020-02-22 DIAGNOSIS — I110 Hypertensive heart disease with heart failure: Secondary | ICD-10-CM | POA: Insufficient documentation

## 2020-02-22 DIAGNOSIS — Z7982 Long term (current) use of aspirin: Secondary | ICD-10-CM | POA: Diagnosis not present

## 2020-02-22 DIAGNOSIS — F419 Anxiety disorder, unspecified: Secondary | ICD-10-CM | POA: Diagnosis not present

## 2020-02-22 DIAGNOSIS — E7800 Pure hypercholesterolemia, unspecified: Secondary | ICD-10-CM | POA: Diagnosis not present

## 2020-02-22 DIAGNOSIS — I429 Cardiomyopathy, unspecified: Secondary | ICD-10-CM | POA: Diagnosis not present

## 2020-02-22 DIAGNOSIS — Z794 Long term (current) use of insulin: Secondary | ICD-10-CM | POA: Diagnosis not present

## 2020-02-22 DIAGNOSIS — Z006 Encounter for examination for normal comparison and control in clinical research program: Secondary | ICD-10-CM | POA: Diagnosis not present

## 2020-02-22 DIAGNOSIS — I5022 Chronic systolic (congestive) heart failure: Secondary | ICD-10-CM | POA: Diagnosis not present

## 2020-02-22 DIAGNOSIS — K219 Gastro-esophageal reflux disease without esophagitis: Secondary | ICD-10-CM | POA: Diagnosis not present

## 2020-02-22 DIAGNOSIS — F329 Major depressive disorder, single episode, unspecified: Secondary | ICD-10-CM | POA: Insufficient documentation

## 2020-02-22 DIAGNOSIS — E119 Type 2 diabetes mellitus without complications: Secondary | ICD-10-CM | POA: Diagnosis not present

## 2020-02-22 DIAGNOSIS — E785 Hyperlipidemia, unspecified: Secondary | ICD-10-CM | POA: Insufficient documentation

## 2020-02-22 DIAGNOSIS — Z79899 Other long term (current) drug therapy: Secondary | ICD-10-CM | POA: Insufficient documentation

## 2020-02-22 DIAGNOSIS — Z950 Presence of cardiac pacemaker: Secondary | ICD-10-CM | POA: Diagnosis present

## 2020-02-22 HISTORY — PX: BIV PACEMAKER INSERTION CRT-P: EP1199

## 2020-02-22 LAB — CUP PACEART REMOTE DEVICE CHECK
Battery Voltage: 3.2 V
Brady Statistic AP VP Percent: 0.06 %
Brady Statistic AP VS Percent: 0 %
Brady Statistic AS VP Percent: 98.72 %
Brady Statistic AS VS Percent: 1.22 %
Brady Statistic RA Percent Paced: 0.06 %
Brady Statistic RV Percent Paced: 1.08 %
Date Time Interrogation Session: 20210610145138
Implantable Pulse Generator Implant Date: 20150406
Lead Channel Impedance Value: 1273 Ohm
Lead Channel Impedance Value: 1387 Ohm
Lead Channel Impedance Value: 361 Ohm
Lead Channel Impedance Value: 380 Ohm
Lead Channel Impedance Value: 494 Ohm
Lead Channel Impedance Value: 513 Ohm
Lead Channel Impedance Value: 627 Ohm
Lead Channel Impedance Value: 741 Ohm
Lead Channel Impedance Value: 798 Ohm
Lead Channel Sensing Intrinsic Amplitude: 2.25 mV
Lead Channel Sensing Intrinsic Amplitude: 6 mV
Lead Channel Setting Pacing Amplitude: 3.5 V
Lead Channel Setting Pacing Amplitude: 3.5 V
Lead Channel Setting Pacing Amplitude: 3.5 V
Lead Channel Setting Pacing Pulse Width: 0.4 ms
Lead Channel Setting Pacing Pulse Width: 0.6 ms
Lead Channel Setting Sensing Sensitivity: 0.9 mV

## 2020-02-22 LAB — GLUCOSE, CAPILLARY
Glucose-Capillary: 165 mg/dL — ABNORMAL HIGH (ref 70–99)
Glucose-Capillary: 171 mg/dL — ABNORMAL HIGH (ref 70–99)

## 2020-02-22 SURGERY — BIV PACEMAKER INSERTION CRT-P

## 2020-02-22 MED ORDER — CHLORHEXIDINE GLUCONATE 4 % EX LIQD: 4.0000 "application " | Freq: Once | CUTANEOUS | Status: DC

## 2020-02-22 MED ORDER — SODIUM CHLORIDE 0.9 % IV SOLN
80.0000 mg | INTRAVENOUS | Status: AC
  Administered 2020-02-22: 80 mg
  Filled 2020-02-22: qty 2

## 2020-02-22 MED ORDER — LIDOCAINE HCL 1 % IJ SOLN
INTRAMUSCULAR | Status: AC
  Filled 2020-02-22: qty 60

## 2020-02-22 MED ORDER — CEFAZOLIN SODIUM-DEXTROSE 2-4 GM/100ML-% IV SOLN
INTRAVENOUS | Status: AC
  Filled 2020-02-22: qty 100

## 2020-02-22 MED ORDER — SODIUM CHLORIDE 0.9 % IV SOLN
INTRAVENOUS | Status: AC
  Filled 2020-02-22: qty 2

## 2020-02-22 MED ORDER — LIDOCAINE HCL (PF) 1 % IJ SOLN
INTRAMUSCULAR | Status: DC | PRN
  Administered 2020-02-22: 60 mL

## 2020-02-22 MED ORDER — MIDAZOLAM HCL 5 MG/5ML IJ SOLN
INTRAMUSCULAR | Status: DC | PRN
  Administered 2020-02-22: 2 mg via INTRAVENOUS
  Administered 2020-02-22: 1 mg via INTRAVENOUS
  Administered 2020-02-22 (×2): 2 mg via INTRAVENOUS
  Administered 2020-02-22 (×3): 1 mg via INTRAVENOUS

## 2020-02-22 MED ORDER — FENTANYL CITRATE (PF) 100 MCG/2ML IJ SOLN
INTRAMUSCULAR | Status: AC
  Filled 2020-02-22: qty 2

## 2020-02-22 MED ORDER — ACETAMINOPHEN 325 MG PO TABS: 325.0000 mg | ORAL_TABLET | ORAL | Status: DC | PRN

## 2020-02-22 MED ORDER — MIDAZOLAM HCL 5 MG/5ML IJ SOLN
INTRAMUSCULAR | Status: AC
  Filled 2020-02-22: qty 5

## 2020-02-22 MED ORDER — CEFAZOLIN SODIUM-DEXTROSE 2-4 GM/100ML-% IV SOLN
2.0000 g | INTRAVENOUS | Status: AC
  Administered 2020-02-22: 2 g via INTRAVENOUS

## 2020-02-22 MED ORDER — ONDANSETRON HCL 4 MG/2ML IJ SOLN: 4.0000 mg | Freq: Four times a day (QID) | INTRAMUSCULAR | Status: DC | PRN

## 2020-02-22 MED ORDER — SODIUM CHLORIDE 0.9 % IV SOLN: INTRAVENOUS | Status: DC

## 2020-02-22 MED ORDER — FENTANYL CITRATE (PF) 100 MCG/2ML IJ SOLN
INTRAMUSCULAR | Status: DC | PRN
  Administered 2020-02-22: 12.5 ug via INTRAVENOUS
  Administered 2020-02-22 (×3): 25 ug via INTRAVENOUS
  Administered 2020-02-22: 12.5 ug via INTRAVENOUS
  Administered 2020-02-22: 25 ug via INTRAVENOUS

## 2020-02-22 SURGICAL SUPPLY — 9 items
CABLE SURGICAL S-101-97-12 (CABLE) ×3 IMPLANT
DEVICE DISSECT PLASMABLAD 3.0S (MISCELLANEOUS) IMPLANT
PACEMAKER PRCT MRI CRTP W1TR01 (Pacemaker) IMPLANT
PAD PRO RADIOLUCENT 2001M-C (PAD) ×3 IMPLANT
PLASMABLADE 3.0S (MISCELLANEOUS) ×3
POUCH AIGIS-R ANTIBACT ICD (Mesh General) ×3 IMPLANT
POUCH AIGIS-R ANTIBACT ICD LRG (Mesh General) IMPLANT
PPM PRECEPTA MRI CRT-P W1TR01 (Pacemaker) ×3 IMPLANT
TRAY PACEMAKER INSERTION (PACKS) ×3 IMPLANT

## 2020-02-22 NOTE — Discharge Instructions (Signed)
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
## 2020-02-23 MED FILL — Lidocaine HCl Local Inj 1%: INTRAMUSCULAR | Qty: 60 | Status: AC

## 2020-03-04 ENCOUNTER — Ambulatory Visit: Payer: Medicare Other | Admitting: Family Medicine

## 2020-03-05 ENCOUNTER — Ambulatory Visit (INDEPENDENT_AMBULATORY_CARE_PROVIDER_SITE_OTHER): Payer: Medicare Other | Admitting: Emergency Medicine

## 2020-03-05 ENCOUNTER — Other Ambulatory Visit: Payer: Self-pay

## 2020-03-05 DIAGNOSIS — Z950 Presence of cardiac pacemaker: Secondary | ICD-10-CM

## 2020-03-05 DIAGNOSIS — I5043 Acute on chronic combined systolic (congestive) and diastolic (congestive) heart failure: Secondary | ICD-10-CM

## 2020-03-05 LAB — CUP PACEART INCLINIC DEVICE CHECK
Battery Remaining Longevity: 95 mo
Battery Voltage: 3.2 V
Brady Statistic AP VP Percent: 0.01 %
Brady Statistic AP VS Percent: 0.01 %
Brady Statistic AS VP Percent: 97.85 %
Brady Statistic AS VS Percent: 2.12 %
Brady Statistic RA Percent Paced: 0.03 %
Brady Statistic RV Percent Paced: 29.47 %
Date Time Interrogation Session: 20210622164400
Implantable Lead Implant Date: 20060214
Implantable Lead Implant Date: 20150406
Implantable Lead Implant Date: 20150406
Implantable Lead Location: 753858
Implantable Lead Location: 753859
Implantable Lead Location: 753860
Implantable Lead Model: 4396
Implantable Lead Model: 5076
Implantable Pulse Generator Implant Date: 20210610
Lead Channel Impedance Value: 1292 Ohm
Lead Channel Impedance Value: 1406 Ohm
Lead Channel Impedance Value: 342 Ohm
Lead Channel Impedance Value: 399 Ohm
Lead Channel Impedance Value: 456 Ohm
Lead Channel Impedance Value: 532 Ohm
Lead Channel Impedance Value: 608 Ohm
Lead Channel Impedance Value: 722 Ohm
Lead Channel Impedance Value: 798 Ohm
Lead Channel Pacing Threshold Amplitude: 1 V
Lead Channel Pacing Threshold Amplitude: 1.5 V
Lead Channel Pacing Threshold Amplitude: 1.75 V
Lead Channel Pacing Threshold Pulse Width: 0.4 ms
Lead Channel Pacing Threshold Pulse Width: 0.4 ms
Lead Channel Pacing Threshold Pulse Width: 0.6 ms
Lead Channel Sensing Intrinsic Amplitude: 2.75 mV
Lead Channel Sensing Intrinsic Amplitude: 8.125 mV
Lead Channel Setting Pacing Amplitude: 2.25 V
Lead Channel Setting Pacing Amplitude: 2.25 V
Lead Channel Setting Pacing Amplitude: 3 V
Lead Channel Setting Pacing Pulse Width: 0.4 ms
Lead Channel Setting Pacing Pulse Width: 0.6 ms
Lead Channel Setting Sensing Sensitivity: 0.9 mV

## 2020-03-05 NOTE — Progress Notes (Signed)
CRT-P  wound check and device check in clinic. Steri strips removed, wound edges approximated, edema present at wound site that is reported to be decreasing by patient. No drainage ir redness at incision site. Normal device function. Thresholds, sensing, impedance consistent with previous measurements. Histograms appropriate for patient and level of activity. No mode switches or ventricular high rate episodes. Patient bi-ventricularly pacing 97.85% of the time. Device programmed with appropriate safety margins. Device heart failure diagnostics are within normal limits and stable over time. Estimated longevity 7 yrs 11 months. Patient enrolled in remote follow-up and next remote scheduled for 05/24/20. Plan to check device remotely every  3 months. Follow up with Dr Lovena Le 04/10/20 for preop clearance. Patient instructed to apply cold pack to wound site for 20 minutes TID. To call office if she has any increased edema, drainage, redness , fever or chills.

## 2020-03-06 DIAGNOSIS — E139 Other specified diabetes mellitus without complications: Secondary | ICD-10-CM | POA: Diagnosis not present

## 2020-03-06 DIAGNOSIS — I429 Cardiomyopathy, unspecified: Secondary | ICD-10-CM | POA: Diagnosis not present

## 2020-03-06 DIAGNOSIS — E0821 Diabetes mellitus due to underlying condition with diabetic nephropathy: Secondary | ICD-10-CM | POA: Diagnosis not present

## 2020-03-06 DIAGNOSIS — E109 Type 1 diabetes mellitus without complications: Secondary | ICD-10-CM | POA: Diagnosis not present

## 2020-03-06 DIAGNOSIS — Z6823 Body mass index (BMI) 23.0-23.9, adult: Secondary | ICD-10-CM | POA: Diagnosis not present

## 2020-03-06 DIAGNOSIS — R945 Abnormal results of liver function studies: Secondary | ICD-10-CM | POA: Diagnosis not present

## 2020-03-06 DIAGNOSIS — N184 Chronic kidney disease, stage 4 (severe): Secondary | ICD-10-CM | POA: Diagnosis not present

## 2020-03-06 DIAGNOSIS — E1140 Type 2 diabetes mellitus with diabetic neuropathy, unspecified: Secondary | ICD-10-CM | POA: Diagnosis not present

## 2020-03-06 DIAGNOSIS — E785 Hyperlipidemia, unspecified: Secondary | ICD-10-CM | POA: Diagnosis not present

## 2020-03-07 DIAGNOSIS — G8929 Other chronic pain: Secondary | ICD-10-CM | POA: Diagnosis not present

## 2020-03-07 DIAGNOSIS — M545 Low back pain: Secondary | ICD-10-CM | POA: Diagnosis not present

## 2020-03-07 DIAGNOSIS — Z79899 Other long term (current) drug therapy: Secondary | ICD-10-CM | POA: Diagnosis not present

## 2020-03-07 DIAGNOSIS — F411 Generalized anxiety disorder: Secondary | ICD-10-CM | POA: Diagnosis not present

## 2020-03-11 ENCOUNTER — Encounter (HOSPITAL_COMMUNITY): Payer: Self-pay | Admitting: *Deleted

## 2020-03-11 ENCOUNTER — Emergency Department (HOSPITAL_COMMUNITY): Payer: Medicare Other

## 2020-03-11 ENCOUNTER — Inpatient Hospital Stay (HOSPITAL_COMMUNITY)
Admission: EM | Admit: 2020-03-11 | Discharge: 2020-03-14 | DRG: 091 | Disposition: A | Discharge status: Home Health | Payer: Medicare Other | Attending: Family Medicine | Admitting: Family Medicine

## 2020-03-11 ENCOUNTER — Other Ambulatory Visit: Payer: Self-pay

## 2020-03-11 DIAGNOSIS — Z03818 Encounter for observation for suspected exposure to other biological agents ruled out: Secondary | ICD-10-CM | POA: Diagnosis not present

## 2020-03-11 DIAGNOSIS — R52 Pain, unspecified: Secondary | ICD-10-CM

## 2020-03-11 DIAGNOSIS — Z7982 Long term (current) use of aspirin: Secondary | ICD-10-CM

## 2020-03-11 DIAGNOSIS — Z794 Long term (current) use of insulin: Secondary | ICD-10-CM | POA: Diagnosis not present

## 2020-03-11 DIAGNOSIS — K219 Gastro-esophageal reflux disease without esophagitis: Secondary | ICD-10-CM | POA: Diagnosis present

## 2020-03-11 DIAGNOSIS — R9431 Abnormal electrocardiogram [ECG] [EKG]: Secondary | ICD-10-CM | POA: Diagnosis not present

## 2020-03-11 DIAGNOSIS — Z950 Presence of cardiac pacemaker: Secondary | ICD-10-CM

## 2020-03-11 DIAGNOSIS — I429 Cardiomyopathy, unspecified: Secondary | ICD-10-CM | POA: Diagnosis present

## 2020-03-11 DIAGNOSIS — R410 Disorientation, unspecified: Secondary | ICD-10-CM | POA: Diagnosis not present

## 2020-03-11 DIAGNOSIS — F341 Dysthymic disorder: Secondary | ICD-10-CM | POA: Diagnosis present

## 2020-03-11 DIAGNOSIS — G92 Toxic encephalopathy: Principal | ICD-10-CM | POA: Diagnosis present

## 2020-03-11 DIAGNOSIS — Z79891 Long term (current) use of opiate analgesic: Secondary | ICD-10-CM

## 2020-03-11 DIAGNOSIS — Z20822 Contact with and (suspected) exposure to covid-19: Secondary | ICD-10-CM | POA: Diagnosis present

## 2020-03-11 DIAGNOSIS — N390 Urinary tract infection, site not specified: Secondary | ICD-10-CM | POA: Diagnosis present

## 2020-03-11 DIAGNOSIS — N1832 Chronic kidney disease, stage 3b: Secondary | ICD-10-CM | POA: Diagnosis present

## 2020-03-11 DIAGNOSIS — R4182 Altered mental status, unspecified: Secondary | ICD-10-CM

## 2020-03-11 DIAGNOSIS — E1122 Type 2 diabetes mellitus with diabetic chronic kidney disease: Secondary | ICD-10-CM | POA: Diagnosis present

## 2020-03-11 DIAGNOSIS — Z820 Family history of epilepsy and other diseases of the nervous system: Secondary | ICD-10-CM

## 2020-03-11 DIAGNOSIS — IMO0002 Reserved for concepts with insufficient information to code with codable children: Secondary | ICD-10-CM

## 2020-03-11 DIAGNOSIS — Z95810 Presence of automatic (implantable) cardiac defibrillator: Secondary | ICD-10-CM | POA: Diagnosis present

## 2020-03-11 DIAGNOSIS — F418 Other specified anxiety disorders: Secondary | ICD-10-CM | POA: Diagnosis present

## 2020-03-11 DIAGNOSIS — G8929 Other chronic pain: Secondary | ICD-10-CM | POA: Diagnosis present

## 2020-03-11 DIAGNOSIS — Z79899 Other long term (current) drug therapy: Secondary | ICD-10-CM | POA: Diagnosis not present

## 2020-03-11 DIAGNOSIS — B952 Enterococcus as the cause of diseases classified elsewhere: Secondary | ICD-10-CM | POA: Diagnosis present

## 2020-03-11 DIAGNOSIS — E1165 Type 2 diabetes mellitus with hyperglycemia: Secondary | ICD-10-CM | POA: Diagnosis present

## 2020-03-11 DIAGNOSIS — I5042 Chronic combined systolic (congestive) and diastolic (congestive) heart failure: Secondary | ICD-10-CM | POA: Diagnosis present

## 2020-03-11 DIAGNOSIS — I5032 Chronic diastolic (congestive) heart failure: Secondary | ICD-10-CM | POA: Diagnosis present

## 2020-03-11 DIAGNOSIS — M545 Low back pain: Secondary | ICD-10-CM | POA: Diagnosis present

## 2020-03-11 DIAGNOSIS — T424X5A Adverse effect of benzodiazepines, initial encounter: Secondary | ICD-10-CM | POA: Diagnosis present

## 2020-03-11 DIAGNOSIS — I130 Hypertensive heart and chronic kidney disease with heart failure and stage 1 through stage 4 chronic kidney disease, or unspecified chronic kidney disease: Secondary | ICD-10-CM | POA: Diagnosis present

## 2020-03-11 DIAGNOSIS — M25559 Pain in unspecified hip: Secondary | ICD-10-CM | POA: Diagnosis present

## 2020-03-11 DIAGNOSIS — E782 Mixed hyperlipidemia: Secondary | ICD-10-CM | POA: Diagnosis present

## 2020-03-11 DIAGNOSIS — Z833 Family history of diabetes mellitus: Secondary | ICD-10-CM

## 2020-03-11 DIAGNOSIS — T402X5A Adverse effect of other opioids, initial encounter: Secondary | ICD-10-CM | POA: Diagnosis present

## 2020-03-11 DIAGNOSIS — G9341 Metabolic encephalopathy: Secondary | ICD-10-CM | POA: Diagnosis present

## 2020-03-11 DIAGNOSIS — T426X5A Adverse effect of other antiepileptic and sedative-hypnotic drugs, initial encounter: Secondary | ICD-10-CM | POA: Diagnosis present

## 2020-03-11 DIAGNOSIS — E119 Type 2 diabetes mellitus without complications: Secondary | ICD-10-CM

## 2020-03-11 DIAGNOSIS — I639 Cerebral infarction, unspecified: Secondary | ICD-10-CM | POA: Diagnosis present

## 2020-03-11 DIAGNOSIS — M1612 Unilateral primary osteoarthritis, left hip: Secondary | ICD-10-CM | POA: Diagnosis not present

## 2020-03-11 DIAGNOSIS — Z808 Family history of malignant neoplasm of other organs or systems: Secondary | ICD-10-CM

## 2020-03-11 DIAGNOSIS — N319 Neuromuscular dysfunction of bladder, unspecified: Secondary | ICD-10-CM | POA: Diagnosis present

## 2020-03-11 DIAGNOSIS — I119 Hypertensive heart disease without heart failure: Secondary | ICD-10-CM | POA: Diagnosis present

## 2020-03-11 LAB — CBC WITH DIFFERENTIAL/PLATELET
Abs Immature Granulocytes: 0.02 10*3/uL (ref 0.00–0.07)
Basophils Absolute: 0 10*3/uL (ref 0.0–0.1)
Basophils Relative: 1 %
Eosinophils Absolute: 0.2 10*3/uL (ref 0.0–0.5)
Eosinophils Relative: 3 %
HCT: 40.1 % (ref 36.0–46.0)
Hemoglobin: 12.8 g/dL (ref 12.0–15.0)
Immature Granulocytes: 0 %
Lymphocytes Relative: 28 %
Lymphs Abs: 1.6 10*3/uL (ref 0.7–4.0)
MCH: 30.7 pg (ref 26.0–34.0)
MCHC: 31.9 g/dL (ref 30.0–36.0)
MCV: 96.2 fL (ref 80.0–100.0)
Monocytes Absolute: 0.6 10*3/uL (ref 0.1–1.0)
Monocytes Relative: 10 %
Neutro Abs: 3.5 10*3/uL (ref 1.7–7.7)
Neutrophils Relative %: 58 %
Platelets: 278 10*3/uL (ref 150–400)
RBC: 4.17 MIL/uL (ref 3.87–5.11)
RDW: 13.5 % (ref 11.5–15.5)
WBC: 5.9 10*3/uL (ref 4.0–10.5)
nRBC: 0 % (ref 0.0–0.2)

## 2020-03-11 LAB — ETHANOL: Alcohol, Ethyl (B): <10 mg/dL (ref <10)

## 2020-03-11 LAB — COMPREHENSIVE METABOLIC PANEL
ALT: 47 U/L — ABNORMAL HIGH (ref 0–44)
AST: 40 U/L (ref 15–41)
Albumin: 4.3 g/dL (ref 3.5–5.0)
Alkaline Phosphatase: 112 U/L (ref 38–126)
Anion gap: 13 (ref 5–15)
BUN: 32 mg/dL — ABNORMAL HIGH (ref 8–23)
CO2: 23 mmol/L (ref 22–32)
Calcium: 9.3 mg/dL (ref 8.9–10.3)
Chloride: 106 mmol/L (ref 98–111)
Creatinine, Ser: 1.6 mg/dL — ABNORMAL HIGH (ref 0.44–1.00)
GFR calc Af Amer: 38 mL/min — ABNORMAL LOW (ref >60)
GFR calc non Af Amer: 33 mL/min — ABNORMAL LOW (ref >60)
Glucose, Bld: 234 mg/dL — ABNORMAL HIGH (ref 70–99)
Potassium: 4.1 mmol/L (ref 3.5–5.1)
Sodium: 142 mmol/L (ref 135–145)
Total Bilirubin: 0.9 mg/dL (ref 0.3–1.2)
Total Protein: 7.4 g/dL (ref 6.5–8.1)

## 2020-03-11 LAB — SALICYLATE LEVEL: Salicylate Lvl: <7 mg/dL — ABNORMAL LOW (ref 7.0–30.0)

## 2020-03-11 LAB — RAPID URINE DRUG SCREEN, HOSP PERFORMED
Amphetamines: NOT DETECTED
Barbiturates: NOT DETECTED
Benzodiazepines: POSITIVE — AB
Cocaine: NOT DETECTED
Opiates: POSITIVE — AB
Tetrahydrocannabinol: NOT DETECTED

## 2020-03-11 LAB — URINALYSIS, ROUTINE W REFLEX MICROSCOPIC
Bilirubin Urine: NEGATIVE
Glucose, UA: 50 mg/dL — AB
Hgb urine dipstick: NEGATIVE
Ketones, ur: NEGATIVE mg/dL
Nitrite: POSITIVE — AB
Protein, ur: 30 mg/dL — AB
Specific Gravity, Urine: 1.013 (ref 1.005–1.030)
pH: 5 (ref 5.0–8.0)

## 2020-03-11 LAB — CBG MONITORING, ED: Glucose-Capillary: 217 mg/dL — ABNORMAL HIGH (ref 70–99)

## 2020-03-11 MED ORDER — SODIUM CHLORIDE 0.9 % IV BOLUS
1000.0000 mL | Freq: Once | INTRAVENOUS | Status: AC
  Administered 2020-03-11: 1000 mL via INTRAVENOUS

## 2020-03-11 MED ORDER — SODIUM CHLORIDE 0.9 % IV SOLN
1.0000 g | Freq: Once | INTRAVENOUS | Status: AC
  Administered 2020-03-11: 1 g via INTRAVENOUS
  Filled 2020-03-11: qty 10

## 2020-03-11 MED ORDER — ACETAMINOPHEN 325 MG PO TABS
650.0000 mg | ORAL_TABLET | Freq: Once | ORAL | Status: AC
  Administered 2020-03-11: 650 mg via ORAL
  Filled 2020-03-11: qty 2

## 2020-03-11 NOTE — ED Notes (Signed)
Neighbor reports when she went to check on the patient at her home the pt was not talking coherently and she had taken apart a toilet in the middle of the night

## 2020-03-11 NOTE — ED Triage Notes (Signed)
Pt brought in by a neighbor stating she has altered mental status x 2 days; pt self caths for her urine and it may be a UTI

## 2020-03-11 NOTE — ED Provider Notes (Signed)
[facility] Provider Note   CSN: [id number] Arrival date & time: 03/11/20  1640     History Chief Complaint  Patient presents with  . Altered Mental Status    [name] is a 69 y.o. female.  HPI  LEVEL 5 CAVEAT 2/90 TO AMS 69 year old female with a history of neurogenic bladder, CHF with an EF of 55 to 60% as of 01/18/2020, DM type II, with ICD to the ER with altered mental status.  Patient was brought into the ER by her neighbor, who noted increasing confusion starting Sunday.  Patient's neighbor states that she spoke with her on Saturday and the patient seemed alert and oriented, however on Sunday appeared acutely confused.  She states she made it back into town to check on her, and noticed that she was even more confused than on Sunday.  She was found in her bed unaware of her surroundings.  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  Patient has a history of a neurogenic bladder and generally has to self catheterize herself, and her neighbor suspects that her altered mental status may be due to a UTI.    Past Medical History:  Diagnosis Date  . Anxiety   . Cardiomyopathy   . Cervical high risk HPV (human papillomavirus) test positive 01/30/2015  . Chronic constipation   . Chronic lower back pain   . Constipation 01/30/2015  . Depression   . GERD (gastroesophageal reflux disease)   . Heart failure    Chronic combined  . HTN (hypertension)   . Hypercholesteremia   . Neurogenic bladder    2008 nerve damage s/p back ssurgery  . Type II diabetes mellitus [facility])     Patient Active Problem List   Diagnosis Date Noted  . Stage 3b chronic kidney disease 10/24/2019  . Urine leukocytes 09/24/2019  . Other microscopic hematuria 09/24/2019  . Postmenopause 02/08/2018  . Screening for colorectal cancer 02/08/2018  . Well woman exam with routine gynecological exam 02/08/2018  . Anemia in chronic kidney disease 12/15/2016    . Essential hypertension, benign 09/12/2015  . Constipation 01/30/2015  . Cervical high risk HPV (human papillomavirus) test positive 01/30/2015  . Biventricular ICD (implantable cardioverter-defibrillator) in place 04/19/2014  . Chronic systolic heart failure ([place]) 12/18/2013  . Preop cardiovascular exam 11/17/2011  . Colon cancer screening 02/04/2011  . Uncontrolled type 2 diabetes mellitus with stage 3 chronic kidney disease, with long-term current use of insulin ([place]) 12/31/2009  . Mixed hyperlipidemia 12/31/2009  . ANXIETY DEPRESSION 12/31/2009  . GASTROESOPHAGEAL REFLUX DISEASE, CHRONIC 12/31/2009  . CONSTIPATION, CHRONIC 12/31/2009  . BACK PAIN, CHRONIC 12/31/2009  . Cerebral vascular accident ([place]) 05/22/2009  . HYPERTENSION, HEART CONTROLLED W/O ASSOC CHF 11/27/2008  . COMBINED HEART FAILURE, CHRONIC 11/27/2008  . PACEMAKER, PERMANENT 11/27/2008    Past Surgical History:  Procedure Laterality Date  . ACHILLES TENDON SURGERY Right 06/2012  . BACK SURGERY  2005  . BI-VENTRICULAR IMPLANTABLE CARDIOVERTER DEFIBRILLATOR  (CRT-D)  12-18-2013   upgrade of previously implanted dual chamber pacemaker to MDT CRTD with RV lead revision by Dr Lovena Le  . BI-VENTRICULAR PACEMAKER UPGRADE  12/18/2013   w/defibrillator  . BIV PACEMAKER GENERATOR CHANGE OUT N/A 12/18/2013   Procedure: BIV PACEMAKER GENERATOR CHANGE OUT;  Surgeon: Evans Lance, MD;  Location: [facility] CATH LAB;  Service: Cardiovascular;  Laterality: N/A;  . BIV PACEMAKER INSERTION CRT-P N/A 02/22/2020   Procedure: DOWNGRADE BIV PACEMAKER INSERTION CRT-P;  Surgeon: Evans Lance, MD;  Location: [facility] CV LAB;  Service: Cardiovascular;  Laterality: N/A;  . BREAST BIOPSY Left 1973   benign  . BREAST LUMPECTOMY Left 1973  . CESAREAN SECTION  1981  . CHOLECYSTECTOMY    . GASTROCNEMIUS RECESSION  06/24/2012   Procedure: GASTROCNEMIUS SLIDE;  Surgeon: Colin Rhein, MD;  Location: [facility] ORS;  Service: Orthopedics;  Laterality: Right;   . HERNIA REPAIR    . LEAD REVISION N/A 12/18/2013   Procedure: LEAD REVISION;  Surgeon: Evans Lance, MD;  Location: [facility] CATH LAB;  Service: Cardiovascular;  Laterality: N/A;  . PACEMAKER INSERTION  2006   Dual chamber MDT pacemaker implanted - subsequent upgrade to CRTD 12-2013  . POSTERIOR LUMBAR FUSION  2006; 2008  . SIGMOIDOSCOPY  MAY 2011 w/ PROP   iTCS-->FSIG-poor bowel prep  . TUBAL LIGATION  ~ 1984  . UPPER GASTROINTESTINAL ENDOSCOPY  MAY 2011 w/ PROP   MILD GASTRITIS 2o ETOH     OB History    Gravida  1   Para  1   Term      Preterm      AB      Living        SAB      TAB      Ectopic      Multiple      Live Births  1           Family History  Problem Relation Age of Onset  . Alzheimer's disease Mother   . Diabetes Mother   . Cancer Father   . Cancer Sister        breast  . Diabetes Sister   . Diabetes Brother   . Brain cancer Brother   . Diabetes Brother   . Colon cancer Neg Hx   . Colon polyps Neg Hx     Social History   Tobacco Use  . Smoking status: Never Smoker  . Smokeless tobacco: Never Used  Vaping Use  . Vaping Use: Never used  Substance Use Topics  . Alcohol use: Yes    Alcohol/week: 0.0 standard drinks    Comment: drinks beer or wine three times a week  . Drug use: No    Home Medications Prior to Admission medications   Medication Sig Start Date End Date Taking? Authorizing Provider  ALPRAZolam Duanne Moron) 0.5 MG tablet Take 0.5 mg by mouth 4 (four) times daily as needed for anxiety.  07/13/16   [provider]  Artificial Tear Solution (SYSTANE CONTACTS OP) Place 1 drop into both eyes 2 (two) times daily.    [provider]  aspirin EC 81 MG tablet Take 81 mg by mouth daily.    [provider]  buPROPion (WELLBUTRIN SR) 150 MG 12 hr tablet Take 150 mg by mouth daily.  03/03/17   [provider]  BYDUREON BCISE 2 MG/0.85ML AUIJ Inject 2 mg into the muscle every 14 (fourteen) days.  02/10/19    [provider]  carvedilol (COREG) 6.25 MG tablet TAKE ONE TABLET BY MOUTH TWICE A DAY WITH A MEAL Patient taking differently: Take 6.25 mg by mouth 2 (two) times daily with a meal.  09/20/19   Branch, Alphonse Guild, MD  clonazePAM (KLONOPIN) 1 MG tablet Take 1 mg by mouth 2 (two) times daily as needed. 03/07/20   [provider]  digoxin (LANOXIN) 0.125 MG tablet Take 1 tablet (125 mcg total) by mouth daily. 06/14/12   Lovena Le,  Champ Mungo, MD  furosemide (LASIX) 40 MG tablet Take 1 tablet (40 mg total) by mouth daily. 06/06/19   Evans Lance, MD  gabapentin (NEURONTIN) 600 MG tablet Take 600 mg by mouth at bedtime.  11/30/19   [provider]  HYDROcodone-acetaminophen (NORCO) 10-325 MG tablet Take 1-2 tablets by mouth every 6 (six) hours as needed for moderate pain.    [provider]  insulin lispro (HUMALOG KWIKPEN) 100 UNIT/ML KwikPen INJECT UP TO 50 UNITS DAILY Patient taking differently: Inject 6 Units into the skin 3 (three) times daily.  09/21/18   Cassandria Anger, MD  LANTUS SOLOSTAR 100 UNIT/ML Solostar Pen INJECT 50 UNITS INTO THE SKIN DAILY AT 10:00PM Patient taking differently: Inject 16 Units into the skin at bedtime.  09/01/18   Cassandria Anger, MD  Lidocaine 4 % PTCH Apply 1 patch topically 2 (two) times daily. Patient taking differently: Apply 1 patch topically 2 (two) times daily as needed (pain).  05/22/19   Varney Biles, MD  omeprazole (PRILOSEC) 20 MG capsule TAKE ONE CAPSULE BY MOUTH TWICE A DAY Patient taking differently: Take 20 mg by mouth daily.  01/30/20   Corum, Rex Kras, MD  potassium chloride SA (K-DUR,KLOR-CON) 20 MEQ tablet Take 20 mEq by mouth daily.     [provider]  pregabalin (LYRICA) 75 MG capsule Take 75 mg by mouth 2 (two) times daily.    [provider]  rosuvastatin (CRESTOR) 40 MG tablet TAKE ONE (1) TABLET BY MOUTH EVERY DAY (STOP ATORVASTATIN) Patient taking differently: Take 40 mg by mouth  daily.  01/29/20   Maryruth Hancock, MD  venlafaxine (EFFEXOR-XR) 150 MG 24 hr capsule Take 300 mg by mouth every morning.     [provider]  venlafaxine XR (EFFEXOR-XR) 75 MG 24 hr capsule Take 75 mg by mouth daily. 03/06/20   [provider]  zolpidem (AMBIEN) 10 MG tablet Take 10 mg by mouth at bedtime.     [provider]    Allergies    Patient has no known allergies.  Review of Systems   Review of Systems  Unable to perform ROS: Mental status change    Physical Exam Updated Vital Signs BP (!) 155/81   Pulse (!) 103   Temp 99.6 F (37.6 C) (Rectal)   Resp (!) 23   Ht 5\' 1"  (1.549 m)   Wt 59 kg   SpO2 100%   BMI 24.56 kg/m   Physical Exam Vitals and nursing note reviewed.  Constitutional:      General: She is not in acute distress.    Appearance: Normal appearance. She is well-developed. She is not ill-appearing, toxic-appearing or diaphoretic.  HENT:     Head: Normocephalic and atraumatic.     Mouth/Throat:     Mouth: Mucous membranes are moist.  Eyes:     Conjunctiva/sclera: Conjunctivae normal.  Cardiovascular:     Rate and Rhythm: Normal rate and regular rhythm.     Heart sounds: No murmur heard.   Pulmonary:     Effort: Pulmonary effort is normal. No respiratory distress.     Breath sounds: Normal breath sounds.  Abdominal:     Palpations: Abdomen is soft.     Tenderness: There is no abdominal tenderness.     Comments: Abdomen soft and nontender  Musculoskeletal:     Cervical back: Neck supple.  Skin:    General: Skin is warm and dry.  Neurological:     Mental  Status: She is alert.     Comments:  Mental Status:  Alert to person and place, but not time.  Follow 2 step commands without difficulty.  Cranial Nerves:  II: Peripheral visual fields grossly normal, pupils equal, round, reactive to light III,IV, VI: ptosis not present, extra-ocular motions intact bilaterally  V,VII: smile symmetric, facial light touch sensation  equal VIII: hearing grossly normal to voice  X: uvula elevates symmetrically  XI: bilateral shoulder shrug symmetric and strong XII: midline tongue extension without fassiculations Motor:  Normal tone. 5/5 strength of BUE and BLE major muscle groups including strong and equal grip strength and dorsiflexion/plantar flexion Sensory: light touch normal in all extremities. Cerebellar: normal finger-to-nose with bilateral upper extremities, Romberg sign absent      ED Results / Procedures / Treatments   Labs (all labs ordered are listed, but only abnormal results are displayed) Labs Reviewed  COMPREHENSIVE METABOLIC PANEL - Abnormal; Notable for the following components:      Result Value   Glucose, Bld 234 (*)    BUN 32 (*)    Creatinine, Ser 1.60 (*)    ALT 47 (*)    GFR calc non Af Amer 33 (*)    GFR calc Af Amer 38 (*)    All other components within normal limits  URINALYSIS, ROUTINE W REFLEX MICROSCOPIC - Abnormal; Notable for the following components:   Color, Urine AMBER (*)    Glucose, UA 50 (*)    Protein, ur 30 (*)    Nitrite POSITIVE (*)    Leukocytes,Ua SMALL (*)    Bacteria, UA RARE (*)    All other components within normal limits  RAPID URINE DRUG SCREEN, HOSP PERFORMED - Abnormal; Notable for the following components:   Opiates POSITIVE (*)    Benzodiazepines POSITIVE (*)    All other components within normal limits  SALICYLATE LEVEL - Abnormal; Notable for the following components:   Salicylate Lvl <7.8 (*)    All other components within normal limits  CBG MONITORING, ED - Abnormal; Notable for the following components:   Glucose-Capillary 217 (*)    All other components within normal limits  URINE CULTURE  CBC WITH DIFFERENTIAL/PLATELET  ETHANOL  CBG MONITORING, ED    EKG EKG Interpretation  Date/Time:  Monday March 11 2020 18:20:36 EDT Ventricular Rate:  97 PR Interval:    QRS Duration: 135 QT Interval:  372 QTC Calculation: 473 R  Axis:   -11 Text Interpretation: Sinus rhythm Nonspecific intraventricular conduction delay Lateral infarct, old Borderline ST elevation, anterior leads since last tracing no significant change Confirmed by Noemi Chapel [phone]) on 03/11/2020 7:47:48 PM   Radiology CT Head Wo Contrast  Result Date: 03/11/2020 CLINICAL DATA:  Altered mental status EXAM: CT HEAD WITHOUT CONTRAST TECHNIQUE: Contiguous axial images were obtained from the base of the skull through the vertex without intravenous contrast. COMPARISON:  03/15/2019 FINDINGS: Brain: No acute intracranial abnormality. Specifically, no hemorrhage, hydrocephalus, mass lesion, acute infarction, or significant intracranial injury. Vascular: No hyperdense vessel or unexpected calcification. Skull: No acute calvarial abnormality. Sinuses/Orbits: Visualized paranasal sinuses and mastoids clear. Orbital soft tissues unremarkable. Other: None IMPRESSION: Normal study. Electronically Signed   By: Rolm Baptise M.D.   On: 03/11/2020 20:21    Procedures Procedures (including critical care time)  Medications Ordered in ED Medications  sodium chloride 0.9 % bolus 1,000 mL (1,000 mLs Intravenous New Bag/Given 03/11/20 2124)  cefTRIAXone (ROCEPHIN) 1 g in sodium chloride 0.9 % 100 mL IVPB (  1 g Intravenous New Bag/Given 03/11/20 2151)    ED Course  I have reviewed the triage vital signs and the nursing notes.  Pertinent labs & imaging results that were available during my care of the patient were reviewed by me and considered in my medical decision making (see chart for details).    MDM Rules/Calculators/A&P                         Patient presents with altered mental status On physical exam, she is overall well-appearing, in no acute distress, however rambling and unable to answer certain questions.  She is alert to person and place, however she thinks that it is 77.  She will follow two-step commands, no acute neuro deficits.  Abdomen soft and  nontender.  CBC without leukocytosis, normal hemoglobin.  UA with positive nitrates, suggestive of UTI, will send for cultures.  CMP with a stable creatinine per chart review, glucose of 234.  No evidence of DKA.  Negative alcohol, salicylate level.  UDS with positive opioids and benzodiazepines.  Per PDMP, patient has scripts for both.  EKG unchanged from previous.  CT head without acute abnormalities.  Altered mental status could be secondary to UTI, but the patient does not meet sepsis criteria.  She is tachycardic, but not tachypneic on my exam.  Patient started on Rocephin, given fluids.  Discussed the case with Dr. Sabra Heck, consulted hospitalist group for admission for IV antibiotics and close observation.  Patient has remained hemodynamically stable throughout the ED course.   At this stage in the ED course, the patient is medically screened and stable for discharge. Final Clinical Impression(s) / ED Diagnoses Final diagnoses:  Altered mental status, unspecified altered mental status type  Urinary tract infection without hematuria, site unspecified    Rx / DC Orders ED Discharge Orders    None       Lyndel Safe 03/11/20 2301    Noemi Chapel, MD 03/12/20 [phone]

## 2020-03-11 NOTE — ED Notes (Signed)
Patient transported to CT 

## 2020-03-12 DIAGNOSIS — I130 Hypertensive heart and chronic kidney disease with heart failure and stage 1 through stage 4 chronic kidney disease, or unspecified chronic kidney disease: Secondary | ICD-10-CM | POA: Diagnosis present

## 2020-03-12 DIAGNOSIS — T424X5A Adverse effect of benzodiazepines, initial encounter: Secondary | ICD-10-CM | POA: Diagnosis present

## 2020-03-12 DIAGNOSIS — I429 Cardiomyopathy, unspecified: Secondary | ICD-10-CM | POA: Diagnosis present

## 2020-03-12 DIAGNOSIS — R4182 Altered mental status, unspecified: Secondary | ICD-10-CM | POA: Diagnosis present

## 2020-03-12 DIAGNOSIS — G9341 Metabolic encephalopathy: Secondary | ICD-10-CM | POA: Diagnosis present

## 2020-03-12 DIAGNOSIS — Z20822 Contact with and (suspected) exposure to covid-19: Secondary | ICD-10-CM | POA: Diagnosis present

## 2020-03-12 DIAGNOSIS — G92 Toxic encephalopathy: Secondary | ICD-10-CM | POA: Diagnosis present

## 2020-03-12 DIAGNOSIS — N390 Urinary tract infection, site not specified: Secondary | ICD-10-CM

## 2020-03-12 DIAGNOSIS — G8929 Other chronic pain: Secondary | ICD-10-CM | POA: Diagnosis present

## 2020-03-12 DIAGNOSIS — T402X5A Adverse effect of other opioids, initial encounter: Secondary | ICD-10-CM | POA: Diagnosis present

## 2020-03-12 DIAGNOSIS — E1165 Type 2 diabetes mellitus with hyperglycemia: Secondary | ICD-10-CM | POA: Diagnosis present

## 2020-03-12 DIAGNOSIS — M25559 Pain in unspecified hip: Secondary | ICD-10-CM | POA: Diagnosis present

## 2020-03-12 DIAGNOSIS — Z794 Long term (current) use of insulin: Secondary | ICD-10-CM | POA: Diagnosis not present

## 2020-03-12 DIAGNOSIS — K219 Gastro-esophageal reflux disease without esophagitis: Secondary | ICD-10-CM | POA: Diagnosis present

## 2020-03-12 DIAGNOSIS — Z79899 Other long term (current) drug therapy: Secondary | ICD-10-CM | POA: Diagnosis not present

## 2020-03-12 DIAGNOSIS — T426X5A Adverse effect of other antiepileptic and sedative-hypnotic drugs, initial encounter: Secondary | ICD-10-CM | POA: Diagnosis present

## 2020-03-12 DIAGNOSIS — B952 Enterococcus as the cause of diseases classified elsewhere: Secondary | ICD-10-CM | POA: Diagnosis present

## 2020-03-12 DIAGNOSIS — Z79891 Long term (current) use of opiate analgesic: Secondary | ICD-10-CM | POA: Diagnosis not present

## 2020-03-12 DIAGNOSIS — F418 Other specified anxiety disorders: Secondary | ICD-10-CM | POA: Diagnosis present

## 2020-03-12 DIAGNOSIS — R410 Disorientation, unspecified: Secondary | ICD-10-CM

## 2020-03-12 DIAGNOSIS — N1832 Chronic kidney disease, stage 3b: Secondary | ICD-10-CM | POA: Diagnosis present

## 2020-03-12 DIAGNOSIS — I5042 Chronic combined systolic (congestive) and diastolic (congestive) heart failure: Secondary | ICD-10-CM | POA: Diagnosis present

## 2020-03-12 DIAGNOSIS — E1122 Type 2 diabetes mellitus with diabetic chronic kidney disease: Secondary | ICD-10-CM | POA: Diagnosis present

## 2020-03-12 DIAGNOSIS — N319 Neuromuscular dysfunction of bladder, unspecified: Secondary | ICD-10-CM | POA: Diagnosis present

## 2020-03-12 DIAGNOSIS — E782 Mixed hyperlipidemia: Secondary | ICD-10-CM | POA: Diagnosis present

## 2020-03-12 DIAGNOSIS — M545 Low back pain: Secondary | ICD-10-CM | POA: Diagnosis present

## 2020-03-12 LAB — CBC
HCT: 37 % (ref 36.0–46.0)
Hemoglobin: 11.6 g/dL — ABNORMAL LOW (ref 12.0–15.0)
MCH: 31.1 pg (ref 26.0–34.0)
MCHC: 31.4 g/dL (ref 30.0–36.0)
MCV: 99.2 fL (ref 80.0–100.0)
Platelets: 247 10*3/uL (ref 150–400)
RBC: 3.73 MIL/uL — ABNORMAL LOW (ref 3.87–5.11)
RDW: 13.6 % (ref 11.5–15.5)
WBC: 5.8 10*3/uL (ref 4.0–10.5)
nRBC: 0 % (ref 0.0–0.2)

## 2020-03-12 LAB — COMPREHENSIVE METABOLIC PANEL
ALT: 39 U/L (ref 0–44)
AST: 33 U/L (ref 15–41)
Albumin: 3.7 g/dL (ref 3.5–5.0)
Alkaline Phosphatase: 97 U/L (ref 38–126)
Anion gap: 11 (ref 5–15)
BUN: 28 mg/dL — ABNORMAL HIGH (ref 8–23)
CO2: 24 mmol/L (ref 22–32)
Calcium: 8.6 mg/dL — ABNORMAL LOW (ref 8.9–10.3)
Chloride: 107 mmol/L (ref 98–111)
Creatinine, Ser: 1.42 mg/dL — ABNORMAL HIGH (ref 0.44–1.00)
GFR calc Af Amer: 44 mL/min — ABNORMAL LOW (ref >60)
GFR calc non Af Amer: 38 mL/min — ABNORMAL LOW (ref >60)
Glucose, Bld: 189 mg/dL — ABNORMAL HIGH (ref 70–99)
Potassium: 3.7 mmol/L (ref 3.5–5.1)
Sodium: 142 mmol/L (ref 135–145)
Total Bilirubin: 0.5 mg/dL (ref 0.3–1.2)
Total Protein: 6.5 g/dL (ref 6.5–8.1)

## 2020-03-12 LAB — GLUCOSE, CAPILLARY
Glucose-Capillary: 170 mg/dL — ABNORMAL HIGH (ref 70–99)
Glucose-Capillary: 190 mg/dL — ABNORMAL HIGH (ref 70–99)
Glucose-Capillary: 183 mg/dL — ABNORMAL HIGH (ref 70–99)
Glucose-Capillary: 153 mg/dL — ABNORMAL HIGH (ref 70–99)

## 2020-03-12 LAB — SARS CORONAVIRUS 2 BY RT PCR (HOSPITAL ORDER, PERFORMED IN ~~LOC~~ HOSPITAL LAB): SARS Coronavirus 2: NEGATIVE

## 2020-03-12 LAB — HIV ANTIBODY (ROUTINE TESTING W REFLEX): HIV Screen 4th Generation wRfx: NONREACTIVE

## 2020-03-12 MED ORDER — ALPRAZOLAM 0.5 MG PO TABS: 0.5000 mg | ORAL_TABLET | Freq: Three times a day (TID) | ORAL | Status: DC | PRN

## 2020-03-12 MED ORDER — BISACODYL 10 MG RE SUPP: 10.0000 mg | Freq: Once | RECTAL | Status: DC

## 2020-03-12 MED ORDER — SODIUM CHLORIDE 0.9% FLUSH
3.0000 mL | INTRAVENOUS | Status: DC | PRN
  Administered 2020-03-12: 3 mL via INTRAVENOUS

## 2020-03-12 MED ORDER — ASPIRIN EC 81 MG PO TBEC
81.0000 mg | DELAYED_RELEASE_TABLET | Freq: Every day | ORAL | Status: DC
  Administered 2020-03-12 – 2020-03-14 (×3): 81 mg via ORAL
  Filled 2020-03-12 (×3): qty 1

## 2020-03-12 MED ORDER — INSULIN GLARGINE 100 UNIT/ML ~~LOC~~ SOLN
16.0000 [IU] | Freq: Every day | SUBCUTANEOUS | Status: DC
  Administered 2020-03-12 – 2020-03-13 (×2): 16 [IU] via SUBCUTANEOUS
  Filled 2020-03-12 (×3): qty 0.16

## 2020-03-12 MED ORDER — CHLORHEXIDINE GLUCONATE CLOTH 2 % EX PADS
6.0000 | MEDICATED_PAD | Freq: Every day | CUTANEOUS | Status: DC
  Administered 2020-03-13: 6 via TOPICAL

## 2020-03-12 MED ORDER — VENLAFAXINE HCL ER 75 MG PO CP24
300.0000 mg | ORAL_CAPSULE | Freq: Every morning | ORAL | Status: DC
  Administered 2020-03-12 – 2020-03-14 (×3): 300 mg via ORAL
  Filled 2020-03-12 (×3): qty 4

## 2020-03-12 MED ORDER — SODIUM CHLORIDE 0.9 % IV SOLN: INTRAVENOUS | Status: DC

## 2020-03-12 MED ORDER — ACETAMINOPHEN 325 MG PO TABS
650.0000 mg | ORAL_TABLET | Freq: Four times a day (QID) | ORAL | Status: DC | PRN
  Administered 2020-03-12 – 2020-03-13 (×2): 650 mg via ORAL
  Filled 2020-03-12 (×2): qty 2

## 2020-03-12 MED ORDER — VENLAFAXINE HCL ER 37.5 MG PO CP24: 75.0000 mg | ORAL_CAPSULE | Freq: Every day | ORAL | Status: DC

## 2020-03-12 MED ORDER — ACETAMINOPHEN 650 MG RE SUPP: 650.0000 mg | Freq: Four times a day (QID) | RECTAL | Status: DC | PRN

## 2020-03-12 MED ORDER — BUPROPION HCL ER (SR) 150 MG PO TB12
150.0000 mg | ORAL_TABLET | Freq: Every day | ORAL | Status: DC
  Administered 2020-03-12 – 2020-03-14 (×3): 150 mg via ORAL
  Filled 2020-03-12 (×3): qty 1

## 2020-03-12 MED ORDER — FUROSEMIDE 10 MG/ML IJ SOLN
INTRAMUSCULAR | Status: AC
  Filled 2020-03-12: qty 2

## 2020-03-12 MED ORDER — SODIUM CHLORIDE 0.9 % IV SOLN: 250.0000 mL | INTRAVENOUS | Status: DC | PRN

## 2020-03-12 MED ORDER — HYDROMORPHONE HCL 1 MG/ML IJ SOLN
0.5000 mg | INTRAMUSCULAR | Status: DC | PRN
  Administered 2020-03-12 – 2020-03-13 (×4): 0.5 mg via INTRAVENOUS
  Filled 2020-03-12 (×4): qty 0.5

## 2020-03-12 MED ORDER — SODIUM CHLORIDE 0.9% FLUSH
3.0000 mL | Freq: Two times a day (BID) | INTRAVENOUS | Status: DC
  Administered 2020-03-12 – 2020-03-14 (×6): 3 mL via INTRAVENOUS

## 2020-03-12 MED ORDER — PANTOPRAZOLE SODIUM 40 MG PO TBEC
40.0000 mg | DELAYED_RELEASE_TABLET | Freq: Every day | ORAL | Status: DC
  Administered 2020-03-12 – 2020-03-14 (×3): 40 mg via ORAL
  Filled 2020-03-12 (×3): qty 1

## 2020-03-12 MED ORDER — BISACODYL 10 MG RE SUPP
10.0000 mg | Freq: Once | RECTAL | Status: AC
  Administered 2020-03-12: 10 mg via RECTAL

## 2020-03-12 MED ORDER — FUROSEMIDE 10 MG/ML IJ SOLN
INTRAMUSCULAR | Status: AC
  Filled 2020-03-12: qty 4

## 2020-03-12 MED ORDER — FUROSEMIDE 40 MG PO TABS
40.0000 mg | ORAL_TABLET | Freq: Every day | ORAL | Status: DC
  Administered 2020-03-12 – 2020-03-14 (×3): 40 mg via ORAL
  Filled 2020-03-12 (×3): qty 1

## 2020-03-12 MED ORDER — HALOPERIDOL LACTATE 5 MG/ML IJ SOLN
2.0000 mg | Freq: Four times a day (QID) | INTRAMUSCULAR | Status: DC | PRN
  Administered 2020-03-12: 2 mg via INTRAVENOUS
  Filled 2020-03-12: qty 1

## 2020-03-12 MED ORDER — POTASSIUM CHLORIDE CRYS ER 20 MEQ PO TBCR
20.0000 meq | EXTENDED_RELEASE_TABLET | Freq: Every day | ORAL | Status: DC
  Administered 2020-03-12 – 2020-03-14 (×3): 20 meq via ORAL
  Filled 2020-03-12 (×4): qty 1

## 2020-03-12 MED ORDER — ENOXAPARIN SODIUM 30 MG/0.3ML ~~LOC~~ SOLN
30.0000 mg | SUBCUTANEOUS | Status: DC
  Administered 2020-03-13: 30 mg via SUBCUTANEOUS
  Filled 2020-03-12 (×2): qty 0.3

## 2020-03-12 MED ORDER — ONDANSETRON HCL 4 MG PO TABS: 4.0000 mg | ORAL_TABLET | Freq: Four times a day (QID) | ORAL | Status: DC | PRN

## 2020-03-12 MED ORDER — ONDANSETRON HCL 4 MG/2ML IJ SOLN
4.0000 mg | Freq: Four times a day (QID) | INTRAMUSCULAR | Status: DC | PRN
  Administered 2020-03-12: 4 mg via INTRAVENOUS
  Filled 2020-03-12: qty 2

## 2020-03-12 MED ORDER — DIGOXIN 125 MCG PO TABS
125.0000 ug | ORAL_TABLET | Freq: Every day | ORAL | Status: DC
  Administered 2020-03-12 – 2020-03-14 (×3): 125 ug via ORAL
  Filled 2020-03-12 (×3): qty 1

## 2020-03-12 MED ORDER — ZOLPIDEM TARTRATE 5 MG PO TABS
5.0000 mg | ORAL_TABLET | Freq: Every day | ORAL | Status: DC
  Administered 2020-03-12: 5 mg via ORAL
  Filled 2020-03-12: qty 1

## 2020-03-12 MED ORDER — INSULIN ASPART 100 UNIT/ML ~~LOC~~ SOLN
0.0000 [IU] | Freq: Three times a day (TID) | SUBCUTANEOUS | Status: DC
  Administered 2020-03-12 (×3): 2 [IU] via SUBCUTANEOUS
  Administered 2020-03-13 (×2): 1 [IU] via SUBCUTANEOUS
  Administered 2020-03-14: 2 [IU] via SUBCUTANEOUS
  Administered 2020-03-14: 3 [IU] via SUBCUTANEOUS

## 2020-03-12 MED ORDER — ROSUVASTATIN CALCIUM 20 MG PO TABS
40.0000 mg | ORAL_TABLET | Freq: Every day | ORAL | Status: DC
  Administered 2020-03-12 – 2020-03-14 (×3): 40 mg via ORAL
  Filled 2020-03-12 (×3): qty 2

## 2020-03-12 MED ORDER — SODIUM CHLORIDE 0.9 % IV SOLN
1.0000 g | INTRAVENOUS | Status: DC
  Administered 2020-03-12: 1 g via INTRAVENOUS
  Filled 2020-03-12: qty 10

## 2020-03-12 MED ORDER — CARVEDILOL 12.5 MG PO TABS
6.2500 mg | ORAL_TABLET | Freq: Two times a day (BID) | ORAL | Status: DC
  Administered 2020-03-12 – 2020-03-14 (×5): 6.25 mg via ORAL
  Filled 2020-03-12 (×5): qty 1
  Filled 2020-03-12: qty 2

## 2020-03-12 NOTE — ED Notes (Signed)
Patient yelling out went in to see patient. Patient crying and started screaming at  nurse. Try to calm patient down and redirect to see problem. Unsuccessful at this time. Rambling about how "hemorrhoids" called nurse a "bitch". Left room to allow patient to calm down.

## 2020-03-12 NOTE — Progress Notes (Signed)
PROGRESS NOTE    Patient: [name]                            PCP: Maryruth Hancock, MD                    DOB: 1950-12-05            DOA: 03/11/2020 [id number]             DOS: 03/12/2020, 10:26 AM   LOS: 0 days   Date of Service: The patient was seen and examined on 03/12/2020  Subjective:   The patient was seen and examined this AM.  Much more awake, cooperative, complaining of hip pain Hemodynamic stable -Overnight was agitated, confused, yelling, screaming, was not cooperative Currently stable awake alert oriented following commands   Brief Narrative:  Per HPI: [name]  is a 69 y.o. female, with medical history of neurogenic bladder, chronic systolic and diastolic heart failure, s/p ICD, diabetes mellitus type 2 was brought to the ED with complaints of altered mental status. 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  Patient has history of neurogenic bladder and generally has to self catheterize herself. Patient confusion continues to improve, remains agitated. -Noted for abnormal urine in ED, started on IV antibiotics, On chronic narcotics due to chronic back pain,  Assessment & Plan:   Active Problems:   Altered mental status  Altered mental status  -Associated with confusion, agitation, combative -Seems to be multifactorial, ? underlying psychiatric disorder narcotic dependence, infection, UTI -Treating underlying UTI -As needed Haldol, Xanax ordered -As needed Dilaudid, scheduled home medication of Vicodin (gabapentin) on hold -Continuing home medication of Wellbutrin, Effexor,  Urine tract infection, with a history of neurogenic bladder, self-catheterization -We will follow the urine cultures -Patient has been initiated IV antibiotics Rocephin -will be continued  Diabetes mellitus type 2 -Monitoring CBG QA CHS, SSI coverage -  Continue Lantus 6 units subcu daily.   Chronic diastolic CHF -continue Lasix 40 mg daily,  Coreg 6.25 mg twice daily -We will monitor daily weight, I's and O  Chronic pain -Hip pain, back pain -chronic -hold Vicodin, continue Tylenol as needed for pain -Initiated as needed Dilaudid.  History of dysrhythmia/ ?  CAD, -On pacemaker, recently placed -Patient reports she has been stable since the pacemaker placement has had syncopes prior.  Not on chronic anticoagulation -She is to follow-up with cardiology as an outpatient. -We will continue home medication of digoxin, Coreg, statins, Lasix   Nutritional status:          Cultures; Urine Culture >> NGT   Antimicrobials: -6/28 IV Rocephin  Consultants: None   ------------------------------------------------------------------------------------------------------------------------------------------------  DVT prophylaxis:  @VTEPROPHYLAXISORDERS2 @  SCD/Compression stockings and Lovenox SQ Code Status:   Code Status: Full Code Family Communication: No family member present at bedside- attempt will be made to update daily The above findings and plan of care has been discussed with patient  in detail,  they expressed understanding and agreement of above. -Advance care planning has been discussed.   Admission status:    Status is: Inpatient  Remains inpatient appropriate because:Inpatient level of care appropriate due to severity of illness   Dispo: The patient is from: Home              Anticipated d/c is to: Home              Anticipated d/c  date is: 2 days              Patient currently is not medically stable to d/c.        Procedures:   No admission procedures for hospital encounter.    Antimicrobials:  Anti-infectives (From admission, onward)   Start     Dose/Rate Route Frequency Ordered Stop   03/12/20 2200  cefTRIAXone (ROCEPHIN) 1 g in sodium chloride 0.9 % 100 mL IVPB     Discontinue     1 g 200 mL/hr over 30 Minutes Intravenous Every 24 hours 03/12/20 0308     03/11/20 2130  cefTRIAXone  (ROCEPHIN) 1 g in sodium chloride 0.9 % 100 mL IVPB        1 g 200 mL/hr over 30 Minutes Intravenous  Once 03/11/20 2122 03/11/20 2248       Medication:  . aspirin EC  81 mg Oral Daily  . buPROPion  150 mg Oral Daily  . carvedilol  6.25 mg Oral BID WC  . digoxin  125 mcg Oral Daily  . enoxaparin (LOVENOX) injection  30 mg Subcutaneous Q24H  . furosemide  40 mg Oral Daily  . insulin aspart  0-9 Units Subcutaneous TID WC  . insulin glargine  16 Units Subcutaneous QHS  . pantoprazole  40 mg Oral Daily  . potassium chloride SA  20 mEq Oral Daily  . rosuvastatin  40 mg Oral Daily  . sodium chloride flush  3 mL Intravenous Q12H  . venlafaxine XR  300 mg Oral q morning - 10a  . zolpidem  5 mg Oral QHS    sodium chloride, acetaminophen **OR** acetaminophen, [START ON 03/13/2020] ALPRAZolam, haloperidol lactate, HYDROmorphone (DILAUDID) injection, ondansetron **OR** ondansetron (ZOFRAN) IV, sodium chloride flush   Objective:   Vitals:   03/11/20 2001 03/11/20 2128 03/12/20 0100 03/12/20 0315  BP: (!) 155/81  (!) 160/89 (!) 166/76  Pulse: (!) 103  100 96  Resp: (!) 23  15 20   Temp:  99.6 F (37.6 C)  99.4 F (37.4 C)  TempSrc:  Rectal  Oral  SpO2: 100%  99% 100%  Weight:      Height:        Intake/Output Summary (Last 24 hours) at 03/12/2020 1026 Last data filed at 03/12/2020 0500 Gross per 24 hour  Intake --  Output 613 ml  Net -613 ml   Filed Weights   03/11/20 1737  Weight: 59 kg     Examination:   Physical Exam  Constitution:  Alert, cooperative, no distress,  Appears calm and comfortable  Psychiatric: Normal and stable mood and affect, cognition intact,   HEENT: Normocephalic, PERRL, otherwise with in Normal limits  Chest:Chest symmetric Cardio vascular:  S1/S2, RRR, No murmure, No Rubs or Gallops  pulmonary: Clear to auscultation bilaterally, respirations unlabored, negative wheezes / crackles Abdomen: Soft, non-tender, non-distended, bowel sounds,no masses,  no organomegaly Muscular skeletal: Limited exam - in bed, able to move all 4 extremities, Normal strength,  Neuro: CNII-XII intact. , normal motor and sensation, reflexes intact  Extremities: No pitting edema lower extremities, +2 pulses  Skin: Dry, warm to touch, negative for any Rashes, No open wounds Wounds: per nursing documentation    ------------------------------------------------------------------------------------------------------------------------------------------    LABs:  CBC Latest Ref Rng & Units 03/12/2020 03/11/2020 02/19/2020  WBC 4.0 - 10.5 K/uL 5.8 5.9 7.2  Hemoglobin 12.0 - 15.0 g/dL 11.6(L) 12.8 12.1  Hematocrit 36 - 46 % 37.0 40.1 38.5  Platelets 150 - 400 K/uL  247 278 210   CMP Latest Ref Rng & Units 03/12/2020 03/11/2020 02/19/2020  Glucose 70 - 99 mg/dL 189(H) 234(H) 200(H)  BUN 8 - 23 mg/dL 28(H) 32(H) 26  Creatinine 0.44 - 1.00 mg/dL 1.42(H) 1.60(H) 1.33(H)  Sodium 135 - 145 mmol/L 142 142 143  Potassium 3.5 - 5.1 mmol/L 3.7 4.1 4.8  Chloride 98 - 111 mmol/L 107 106 102  CO2 22 - 32 mmol/L 24 23 27   Calcium 8.9 - 10.3 mg/dL 8.6(L) 9.3 8.8  Total Protein 6.5 - 8.1 g/dL 6.5 7.4 -  Total Bilirubin 0.3 - 1.2 mg/dL 0.5 0.9 -  Alkaline Phos 38 - 126 U/L 97 112 -  AST 15 - 41 U/L 33 40 -  ALT 0 - 44 U/L 39 47(H) -       Micro Results Recent Results (from the past 240 hour(s))  SARS Coronavirus 2 by RT PCR (hospital order, performed in [place] hospital lab) Nasopharyngeal Nasopharyngeal Swab     Status: None   Collection Time: 03/12/20  1:30 AM   Specimen: Nasopharyngeal Swab  Result Value Ref Range Status   SARS Coronavirus 2 NEGATIVE NEGATIVE Final    Comment: (NOTE) SARS-CoV-2 target nucleic acids are NOT DETECTED.  and sho    Radiology Reports CT Head Wo Contrast  Result Date: 03/11/2020 CLINICAL DATA:  Altered mental status EXAM: CT HEAD WITHOUT CONTRAST TECHNIQUE: Contiguous axial images were obtained from the base of the skull through the  vertex without intravenous contrast. COMPARISON:  03/15/2019 FINDINGS: Brain: No acute intracranial abnormality. Specifically, no hemorrhage, hydrocephalus, mass lesion, acute infarction, or significant intracranial injury. Vascular: No hyperdense vessel or unexpected calcification. Skull: No acute calvarial abnormality. Sinuses/Orbits: Visualized paranasal sinuses and mastoids clear. Orbital soft tissues unremarkable. Other: None IMPRESSION: Normal study. Electronically Signed   By: Rolm Baptise M.D.   On: 03/11/2020 20:21   EP PPM/ICD IMPLANT  Result Date: 02/22/2020 Conclusion: Successful removal of a previously implanted biventricular ICD which had reached elective replacement, and insertion of a new biventricular pacemaker with relocation of the pacemaker pocket from the subcutaneous to the subpectoral location. Cristopher Peru, MD  CUP PACEART [facility] DEVICE CHECK  Result Date: 03/05/2020 CRT-P  wound check and device check in clinic. Steri strips removed, wound edges approximated, edema present at wound site that is reported to be decreasing by patient. No drainage ir redness at incision site. Normal device function. Thresholds, sensing,  impedance consistent with previous measurements. Histograms appropriate for patient and level of activity. No mode switches or ventricular high rate episodes. Patient bi-ventricularly pacing 97.85% of the time. Device programmed with appropriate safety margins. Device heart failure diagnostics are within normal limits and stable over time. Estimated longevity 7 yrs 11 months. Patient enrolled in remote follow-up and next remote scheduled for 05/24/20. Plan to check device remotely every  3 months. Follow up with [facility] 04/10/20 for preop clearance.  CUP PACEART REMOTE DEVICE CHECK  Result Date: 02/22/2020 Activation. Materials engineer. Normal funciton.   SIGNED: Deatra James, MD, FACP, FHM. [facility],  Pager (please use [email or web address] to page/text)  If  7PM-7AM, please contact night-coverage [email or web address] [facility] 03/12/2020, 10:26 AM

## 2020-03-12 NOTE — H&P (Signed)
TRH H&P    Patient Demographics:    [name], is a 69 y.o. female  MRN: [id number]  DOB - 18-Nov-1950  Admit Date - 03/11/2020  Referring MD/NP/PA: Noemi Chapel  Outpatient Primary MD for the patient is Corum, Rex Kras, MD  Patient coming from: Home  Chief complaint-altered mental status   HPI:    [name]  is a 69 y.o. female, with medical history of neurogenic bladder, chronic systolic and diastolic heart failure, s/p ICD, diabetes mellitus type 2 was brought to the ED with complaints of altered mental status.  Patient was brought to the ER by her neighbor who noticed that she was having increasing confusion starting Sunday.  Patient's neighbor checked back today and she was more confused she was found in her bed, unaware of her surroundings.  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  Patient has history of neurogenic bladder and generally has to self catheterize herself. In the ED patient mental status improved, she is alert and has only mild confusion now. She is was found to abnormal UA, started on IV Rocephin She takes Vicodin for chronic low back pain at home. Patient also takes clonazepam, Lyrica, gabapentin She complains of dysuria Denies abdominal pain Denies chest pain or shortness of breath    Review of systems:    In addition to the HPI above,    All other systems reviewed and are negative.    Past History of the following :    Past Medical History:  Diagnosis Date  . Anxiety   . Cardiomyopathy   . Cervical high risk HPV (human papillomavirus) test positive 01/30/2015  . Chronic constipation   . Chronic lower back pain   . Constipation 01/30/2015  . Depression   . GERD (gastroesophageal reflux disease)   . Heart failure    Chronic combined  . HTN (hypertension)   . Hypercholesteremia   . Neurogenic bladder    2008 nerve damage s/p back  ssurgery  . Type II diabetes mellitus ([place])       Past Surgical History:  Procedure Laterality Date  . ACHILLES TENDON SURGERY Right 06/2012  . BACK SURGERY  2005  . BI-VENTRICULAR IMPLANTABLE CARDIOVERTER DEFIBRILLATOR  (CRT-D)  12-18-2013   upgrade of previously implanted dual chamber pacemaker to MDT CRTD with RV lead revision by Dr Lovena Le  . BI-VENTRICULAR PACEMAKER UPGRADE  12/18/2013   w/defibrillator  . BIV PACEMAKER GENERATOR CHANGE OUT N/A 12/18/2013   Procedure: BIV PACEMAKER GENERATOR CHANGE OUT;  Surgeon: Evans Lance, MD;  Location: [facility] CATH LAB;  Service: Cardiovascular;  Laterality: N/A;  . BIV PACEMAKER INSERTION CRT-P N/A 02/22/2020   Procedure: DOWNGRADE BIV PACEMAKER INSERTION CRT-P;  Surgeon: Evans Lance, MD;  Location: [facility] CV LAB;  Service: Cardiovascular;  Laterality: N/A;  . BREAST BIOPSY Left 1973   benign  . BREAST LUMPECTOMY Left 1973  . CESAREAN SECTION  1981  . CHOLECYSTECTOMY    . GASTROCNEMIUS RECESSION  06/24/2012   Procedure: GASTROCNEMIUS SLIDE;  Surgeon: Eddie Dibbles  Naaman Plummer, MD;  Location: [facility] ORS;  Service: Orthopedics;  Laterality: Right;  . HERNIA REPAIR    . LEAD REVISION N/A 12/18/2013   Procedure: LEAD REVISION;  Surgeon: Evans Lance, MD;  Location: [facility] CATH LAB;  Service: Cardiovascular;  Laterality: N/A;  . PACEMAKER INSERTION  2006   Dual chamber MDT pacemaker implanted - subsequent upgrade to CRTD 12-2013  . POSTERIOR LUMBAR FUSION  2006; 2008  . SIGMOIDOSCOPY  MAY 2011 w/ PROP   iTCS-->FSIG-poor bowel prep  . TUBAL LIGATION  ~ 1984  . UPPER GASTROINTESTINAL ENDOSCOPY  MAY 2011 w/ PROP   MILD GASTRITIS 2o ETOH      Social History:      Social History   Tobacco Use  . Smoking status: Never Smoker  . Smokeless tobacco: Never Used  Substance Use Topics  . Alcohol use: Yes    Alcohol/week: 0.0 standard drinks    Comment: drinks beer or wine three times a week       Family History :     Family History  Problem Relation Age  of Onset  . Alzheimer's disease Mother   . Diabetes Mother   . Cancer Father   . Cancer Sister        breast  . Diabetes Sister   . Diabetes Brother   . Brain cancer Brother   . Diabetes Brother   . Colon cancer Neg Hx   . Colon polyps Neg Hx       Home Medications:   Prior to Admission medications   Medication Sig Start Date End Date Taking? Authorizing Provider  ALPRAZolam Duanne Moron) 0.5 MG tablet Take 0.5 mg by mouth 4 (four) times daily as needed for anxiety.  07/13/16   [provider]  Artificial Tear Solution (SYSTANE CONTACTS OP) Place 1 drop into both eyes 2 (two) times daily.    [provider]  aspirin EC 81 MG tablet Take 81 mg by mouth daily.    [provider]  buPROPion (WELLBUTRIN SR) 150 MG 12 hr tablet Take 150 mg by mouth daily.  03/03/17   [provider]  BYDUREON BCISE 2 MG/0.85ML AUIJ Inject 2 mg into the muscle every 14 (fourteen) days.  02/10/19   [provider]  carvedilol (COREG) 6.25 MG tablet TAKE ONE TABLET BY MOUTH TWICE A DAY WITH A MEAL Patient taking differently: Take 6.25 mg by mouth 2 (two) times daily with a meal.  09/20/19   Branch, Alphonse Guild, MD  clonazePAM (KLONOPIN) 1 MG tablet Take 1 mg by mouth 2 (two) times daily as needed. 03/07/20   [provider]  digoxin (LANOXIN) 0.125 MG tablet Take 1 tablet (125 mcg total) by mouth daily. 06/14/12   Evans Lance, MD  furosemide (LASIX) 40 MG tablet Take 1 tablet (40 mg total) by mouth daily. 06/06/19   Evans Lance, MD  gabapentin (NEURONTIN) 600 MG tablet Take 600 mg by mouth at bedtime.  11/30/19   [provider]  HYDROcodone-acetaminophen (NORCO) 10-325 MG tablet Take 1-2 tablets by mouth every 6 (six) hours as needed for moderate pain.    [provider]  insulin lispro (HUMALOG KWIKPEN) 100 UNIT/ML KwikPen INJECT UP TO 50 UNITS DAILY Patient taking differently: Inject 6 Units into the skin 3 (three) times daily.  09/21/18    Cassandria Anger, MD  LANTUS SOLOSTAR 100 UNIT/ML Solostar Pen INJECT 50 UNITS INTO THE SKIN DAILY AT 10:00PM Patient taking differently: Inject 16 Units  into the skin at bedtime.  09/01/18   Cassandria Anger, MD  Lidocaine 4 % PTCH Apply 1 patch topically 2 (two) times daily. Patient taking differently: Apply 1 patch topically 2 (two) times daily as needed (pain).  05/22/19   Varney Biles, MD  omeprazole (PRILOSEC) 20 MG capsule TAKE ONE CAPSULE BY MOUTH TWICE A DAY Patient taking differently: Take 20 mg by mouth daily.  01/30/20   Corum, Rex Kras, MD  potassium chloride SA (K-DUR,KLOR-CON) 20 MEQ tablet Take 20 mEq by mouth daily.     [provider]  pregabalin (LYRICA) 75 MG capsule Take 75 mg by mouth 2 (two) times daily.    [provider]  rosuvastatin (CRESTOR) 40 MG tablet TAKE ONE (1) TABLET BY MOUTH EVERY DAY (STOP ATORVASTATIN) Patient taking differently: Take 40 mg by mouth daily.  01/29/20   Maryruth Hancock, MD  venlafaxine (EFFEXOR-XR) 150 MG 24 hr capsule Take 300 mg by mouth every morning.     [provider]  venlafaxine XR (EFFEXOR-XR) 75 MG 24 hr capsule Take 75 mg by mouth daily. 03/06/20   [provider]  zolpidem (AMBIEN) 10 MG tablet Take 10 mg by mouth at bedtime.     [provider]     Allergies:    No Known Allergies   Physical Exam:   Vitals  Blood pressure (!) 155/81, pulse (!) 103, temperature 99.6 F (37.6 C), temperature source Rectal, resp. rate (!) 23, height 5\' 1"  (1.549 m), weight 59 kg, SpO2 100 %.  1.  General: Appears in no acute distress  2. Psychiatric: Alert, oriented x3, intact insight and judgment  3. Neurologic: Cranial nerves II through XII grossly intact, no focal deficit noted  4. HEENMT:  Atraumatic normocephalic, extraocular muscles are intact  5. Respiratory : Clear to auscultation bilaterally, no wheezing or crackles auscultated  6. Cardiovascular : S1-S2, regular, no  murmur auscultated, no edema in the lower extremities  7. Gastrointestinal:  Abdomen is soft, nontender, no organomegaly      Data Review:    CBC Recent Labs  Lab 03/11/20 1756  WBC 5.9  HGB 12.8  HCT 40.1  PLT 278  MCV 96.2  MCH 30.7  MCHC 31.9  RDW 13.5  LYMPHSABS 1.6  MONOABS 0.6  EOSABS 0.2  BASOSABS 0.0   ------------------------------------------------------------------------------------------------------------------  Results for orders placed or performed during the hospital encounter of 03/11/20 (from the past 48 hour(s))  CBG monitoring, ED     Status: Abnormal   Collection Time: 03/11/20  5:33 PM  Result Value Ref Range   Glucose-Capillary 217 (H) 70 - 99 mg/dL    Comment: Glucose reference range applies only to samples taken after fasting for at least 8 hours.  Urinalysis, Routine w reflex microscopic     Status: Abnormal   Collection Time: 03/11/20  5:47 PM  Result Value Ref Range   Color, Urine AMBER (A) YELLOW    Comment: BIOCHEMICALS MAY BE AFFECTED BY COLOR   APPearance CLEAR CLEAR   Specific Gravity, Urine 1.013 1.005 - 1.030   pH 5.0 5.0 - 8.0   Glucose, UA 50 (A) NEGATIVE mg/dL   Hgb urine dipstick NEGATIVE NEGATIVE   Bilirubin Urine NEGATIVE NEGATIVE   Ketones, ur NEGATIVE NEGATIVE mg/dL   Protein, ur 30 (A) NEGATIVE mg/dL   Nitrite POSITIVE (A) NEGATIVE   Leukocytes,Ua SMALL (A) NEGATIVE   RBC / HPF 6-10 0 - 5 RBC/hpf   WBC, UA 21-50 0 -  5 WBC/hpf   Bacteria, UA RARE (A) NONE SEEN   Squamous Epithelial / LPF 0-5 0 - 5   WBC Clumps PRESENT    Mucus PRESENT     Comment: Performed at [facility], [address], [place], [place] [address]  Rapid urine drug screen (hospital performed)     Status: Abnormal   Collection Time: 03/11/20  5:52 PM  Result Value Ref Range   Opiates POSITIVE (A) NONE DETECTED   Cocaine NONE DETECTED NONE DETECTED   Benzodiazepines POSITIVE (A) NONE DETECTED   Amphetamines NONE DETECTED NONE DETECTED    Tetrahydrocannabinol NONE DETECTED NONE DETECTED   Barbiturates NONE DETECTED NONE DETECTED    Comment: (NOTE) DRUG SCREEN FOR MEDICAL PURPOSES ONLY.  IF CONFIRMATION IS NEEDED FOR ANY PURPOSE, NOTIFY LAB WITHIN 5 DAYS.  LOWEST DETECTABLE LIMITS FOR URINE DRUG SCREEN Drug Class                     Cutoff (ng/mL) Amphetamine and metabolites    1000 Barbiturate and metabolites    200 Benzodiazepine                 673 Tricyclics and metabolites     300 Opiates and metabolites        300 Cocaine and metabolites        300 THC                            50 Performed at [facility], [address], [place], [place] [address]   CBC with Differential     Status: None   Collection Time: 03/11/20  5:56 PM  Result Value Ref Range   WBC 5.9 4.0 - 10.5 K/uL   RBC 4.17 3.87 - 5.11 MIL/uL   Hemoglobin 12.8 12.0 - 15.0 g/dL   HCT 40.1 36 - 46 %   MCV 96.2 80.0 - 100.0 fL   MCH 30.7 26.0 - 34.0 pg   MCHC 31.9 30.0 - 36.0 g/dL   RDW 13.5 11.5 - 15.5 %   Platelets 278 150 - 400 K/uL   nRBC 0.0 0.0 - 0.2 %   Neutrophils Relative % 58 %   Neutro Abs 3.5 1.7 - 7.7 K/uL   Lymphocytes Relative 28 %   Lymphs Abs 1.6 0.7 - 4.0 K/uL   Monocytes Relative 10 %   Monocytes Absolute 0.6 0 - 1 K/uL   Eosinophils Relative 3 %   Eosinophils Absolute 0.2 0 - 0 K/uL   Basophils Relative 1 %   Basophils Absolute 0.0 0 - 0 K/uL   Immature Granulocytes 0 %   Abs Immature Granulocytes 0.02 0.00 - 0.07 K/uL    Comment: Performed at [facility], [address] [place], [place] [address]  Comprehensive metabolic panel     Status: Abnormal   Collection Time: 03/11/20  5:56 PM  Result Value Ref Range   Sodium 142 135 - 145 mmol/L   Potassium 4.1 3.5 - 5.1 mmol/L   Chloride 106 98 - 111 mmol/L   CO2 23 22 - 32 mmol/L   Glucose, Bld 234 (H) 70 - 99 mg/dL    Comment: Glucose reference range applies only to samples taken after fasting for at least 8 hours.   BUN 32 (H) 8 - 23 mg/dL   Creatinine, Ser 1.60  (H) 0.44 - 1.00 mg/dL   Calcium 9.3 8.9 - 10.3 mg/dL   Total Protein  7.4 6.5 - 8.1 g/dL   Albumin 4.3 3.5 - 5.0 g/dL   AST 40 15 - 41 U/L   ALT 47 (H) 0 - 44 U/L   Alkaline Phosphatase 112 38 - 126 U/L   Total Bilirubin 0.9 0.3 - 1.2 mg/dL   GFR calc non Af Amer 33 (L) >60 mL/min   GFR calc Af Amer 38 (L) >60 mL/min   Anion gap 13 5 - 15    Comment: Performed at [facility], [address] [place], [place] [address]  Ethanol     Status: None   Collection Time: 03/11/20  5:56 PM  Result Value Ref Range   Alcohol, Ethyl (B) <10 <10 mg/dL    Comment: (NOTE) Lowest detectable limit for serum alcohol is 10 mg/dL.  For medical purposes only. Performed at [facility], [address], [place], [place] [address]   Salicylate level     Status: Abnormal   Collection Time: 03/11/20  5:56 PM  Result Value Ref Range   Salicylate Lvl <5.8 (L) 7.0 - 30.0 mg/dL    Comment: Performed at [facility], [address] [place], [place] [address]    Chemistries  Recent Labs  Lab 03/11/20 1756  NA 142  K 4.1  CL 106  CO2 23  GLUCOSE 234*  BUN 32*  CREATININE 1.60*  CALCIUM 9.3  AST 40  ALT 47*  ALKPHOS 112  BILITOT 0.9   ------------------------------------------------------------------------------------------------------------------  ------------------------------------------------------------------------------------------------------------------ GFR: Estimated Creatinine Clearance: 28.2 mL/min (A) (by C-G formula based on SCr of 1.6 mg/dL (H)). Liver Function Tests: Recent Labs  Lab 03/11/20 1756  AST 40  ALT 47*  ALKPHOS 112  BILITOT 0.9  PROT 7.4  ALBUMIN 4.3   No results for input(s): LIPASE, AMYLASE in the last 168 hours. No results for input(s): AMMONIA in the last 168 hours. Coagulation Profile: No results for input(s): INR, PROTIME in the last 168 hours. Cardiac Enzymes: No results for input(s): CKTOTAL, CKMB, CKMBINDEX, TROPONINI in the last 168 hours. BNP (last  3 results) No results for input(s): PROBNP in the last 8760 hours. HbA1C: No results for input(s): HGBA1C in the last 72 hours. CBG: Recent Labs  Lab 03/11/20 1733  GLUCAP 217*   Lipid Profile: No results for input(s): CHOL, HDL, LDLCALC, TRIG, CHOLHDL, LDLDIRECT in the last 72 hours. Thyroid Function Tests: No results for input(s): TSH, T4TOTAL, FREET4, T3FREE, THYROIDAB in the last 72 hours. Anemia Panel: No results for input(s): VITAMINB12, FOLATE, FERRITIN, TIBC, IRON, RETICCTPCT in the last 72 hours.  --------------------------------------------------------------------------------------------------------------- Urine analysis:    Component Value Date/Time   COLORURINE AMBER (A) 03/11/2020 1747   APPEARANCEUR CLEAR 03/11/2020 1747   APPEARANCEUR Clear 02/05/2016 1300   LABSPEC 1.013 03/11/2020 1747   PHURINE 5.0 03/11/2020 1747   GLUCOSEU 50 (A) 03/11/2020 1747   HGBUR NEGATIVE 03/11/2020 1747   BILIRUBINUR NEGATIVE 03/11/2020 1747   BILIRUBINUR negative 09/20/2019 1545   BILIRUBINUR Negative 02/05/2016 1300   KETONESUR NEGATIVE 03/11/2020 1747   PROTEINUR 30 (A) 03/11/2020 1747   UROBILINOGEN 0.2 09/20/2019 1545   UROBILINOGEN 0.2 08/13/2014 1332   NITRITE POSITIVE (A) 03/11/2020 1747   LEUKOCYTESUR SMALL (A) 03/11/2020 1747      Imaging Results:    CT Head Wo Contrast  Result Date: 03/11/2020 CLINICAL DATA:  Altered mental status EXAM: CT HEAD WITHOUT CONTRAST TECHNIQUE: Contiguous axial images were obtained from the base of the skull through the vertex without intravenous contrast. COMPARISON:  03/15/2019 FINDINGS: Brain: No acute  intracranial abnormality. Specifically, no hemorrhage, hydrocephalus, mass lesion, acute infarction, or significant intracranial injury. Vascular: No hyperdense vessel or unexpected calcification. Skull: No acute calvarial abnormality. Sinuses/Orbits: Visualized paranasal sinuses and mastoids clear. Orbital soft tissues unremarkable.  Other: None IMPRESSION: Normal study. Electronically Signed   By: Rolm Baptise M.D.   On: 03/11/2020 20:21    My personal review of EKG: Rhythm NSR, nonspecific ST changes   Assessment & Plan:    Active Problems:   Altered mental status   1. Altered mental status-multifactorial, medication induced and also due to UTI.  Patient found to have abnormal UA, started on IV Rocephin.  Urine culture has been obtained.  Will follow urine culture results.  Will hold Vicodin, Lyrica, gabapentin, clonazepam. 2. UTI-patient has abnormal UA, started on IV Rocephin as above.  Follow urine culture results. 3. Diabetes mellitus type 2-start sliding scale insulin NovoLog.  Continue Lantus 6 units subcu daily. 4. Chronic diastolic insulin CHF-continue Lasix 40 mg daily, Coreg 6.25 mg twice daily 5. Chronic pain-hold Vicodin, continue Tylenol as needed for pain   DVT Prophylaxis-   Lovenox   AM Labs Ordered, also please review Full Orders  Family Communication: Admission, patients condition and plan of care including tests being ordered have been discussed with the patient  who indicate understanding and agree with the plan and Code Status.  Code Status: Full code  Admission status: Inpatient :The appropriate admission status for this patient is INPATIENT. Inpatient status is judged to be reasonable and necessary in order to provide the required intensity of service to ensure the patient's safety. The patient's presenting symptoms, physical exam findings, and initial radiographic and laboratory data in the context of their chronic comorbidities is felt to place them at high risk for further clinical deterioration. Furthermore, it is not anticipated that the patient will be medically stable for discharge from the hospital within 2 midnights of admission. The following factors support the admission status of inpatient.     The patient's presenting symptoms include altered mental status. The worrisome  physical exam findings include altered mental status. The initial radiographic and laboratory data are worrisome because of UTI. The chronic co-morbidities include diabetes mellitus type 2.       * I certify that at the point of admission it is my clinical judgment that the patient will require inpatient hospital care spanning beyond 2 midnights from the point of admission due to high intensity of service, high risk for further deterioration and high frequency of surveillance required.*  Time spent in minutes : 60 minutes   Kamila Broda S Rozena Fierro M.D

## 2020-03-12 NOTE — ED Notes (Signed)
Patient confused and crying. Repeatedly calling out.

## 2020-03-13 ENCOUNTER — Inpatient Hospital Stay (HOSPITAL_COMMUNITY): Payer: Medicare Other

## 2020-03-13 LAB — BASIC METABOLIC PANEL
Anion gap: 10 (ref 5–15)
BUN: 23 mg/dL (ref 8–23)
CO2: 27 mmol/L (ref 22–32)
Calcium: 9.1 mg/dL (ref 8.9–10.3)
Chloride: 102 mmol/L (ref 98–111)
Creatinine, Ser: 1.48 mg/dL — ABNORMAL HIGH (ref 0.44–1.00)
GFR calc Af Amer: 42 mL/min — ABNORMAL LOW (ref >60)
GFR calc non Af Amer: 36 mL/min — ABNORMAL LOW (ref >60)
Glucose, Bld: 195 mg/dL — ABNORMAL HIGH (ref 70–99)
Potassium: 3.7 mmol/L (ref 3.5–5.1)
Sodium: 139 mmol/L (ref 135–145)

## 2020-03-13 LAB — GLUCOSE, CAPILLARY
Glucose-Capillary: 130 mg/dL — ABNORMAL HIGH (ref 70–99)
Glucose-Capillary: 150 mg/dL — ABNORMAL HIGH (ref 70–99)
Glucose-Capillary: 103 mg/dL — ABNORMAL HIGH (ref 70–99)
Glucose-Capillary: 188 mg/dL — ABNORMAL HIGH (ref 70–99)

## 2020-03-13 LAB — CBC
HCT: 39.7 % (ref 36.0–46.0)
Hemoglobin: 12.4 g/dL (ref 12.0–15.0)
MCH: 30.9 pg (ref 26.0–34.0)
MCHC: 31.2 g/dL (ref 30.0–36.0)
MCV: 99 fL (ref 80.0–100.0)
Platelets: 238 10*3/uL (ref 150–400)
RBC: 4.01 MIL/uL (ref 3.87–5.11)
RDW: 13.3 % (ref 11.5–15.5)
WBC: 5.9 10*3/uL (ref 4.0–10.5)
nRBC: 0 % (ref 0.0–0.2)

## 2020-03-13 LAB — HEMOGLOBIN A1C
Hgb A1c MFr Bld: 7.9 % — ABNORMAL HIGH (ref 4.8–5.6)
Mean Plasma Glucose: 180 mg/dL

## 2020-03-13 MED ORDER — HYDROCODONE-ACETAMINOPHEN 10-325 MG PO TABS: 1.0000 | ORAL_TABLET | Freq: Four times a day (QID) | ORAL | Status: DC | PRN

## 2020-03-13 MED ORDER — SODIUM CHLORIDE 0.9 % IV SOLN
1.0000 g | Freq: Three times a day (TID) | INTRAVENOUS | Status: DC
  Administered 2020-03-13 – 2020-03-14 (×2): 1 g via INTRAVENOUS
  Filled 2020-03-13 (×6): qty 1000

## 2020-03-13 MED ORDER — POLYETHYLENE GLYCOL 3350 17 G PO PACK
17.0000 g | PACK | Freq: Two times a day (BID) | ORAL | Status: DC
  Administered 2020-03-13 – 2020-03-14 (×3): 17 g via ORAL
  Filled 2020-03-13 (×3): qty 1

## 2020-03-13 MED ORDER — CLONAZEPAM 0.5 MG PO TABS: 1.0000 mg | ORAL_TABLET | Freq: Two times a day (BID) | ORAL | Status: DC | PRN

## 2020-03-13 MED ORDER — HYDROCODONE-ACETAMINOPHEN 10-325 MG PO TABS
1.0000 | ORAL_TABLET | Freq: Four times a day (QID) | ORAL | Status: DC | PRN
  Administered 2020-03-13 – 2020-03-14 (×4): 1 via ORAL
  Filled 2020-03-13 (×4): qty 1

## 2020-03-13 MED ORDER — ZOLPIDEM TARTRATE 5 MG PO TABS
5.0000 mg | ORAL_TABLET | Freq: Every evening | ORAL | Status: AC | PRN
  Administered 2020-03-14: 5 mg via ORAL
  Filled 2020-03-13: qty 1

## 2020-03-13 NOTE — Progress Notes (Signed)
Patient wants her Ambien so bad and stated she cannot sleep without it. Notified Danny NP and awaiting new orders.

## 2020-03-13 NOTE — Progress Notes (Signed)
PROGRESS NOTE    [name]  QQV:[id number] DOB: [birth year] DOA: 03/11/2020 PCP: Maryruth Hancock, MD   Chief Complaint  Patient presents with  . Altered Mental Status    Brief Narrative: [name]  is [name] 69 y.o. female, with medical history of neurogenic bladder, chronic systolic and diastolic heart failure, s/p ICD, diabetes mellitus type 2 was brought to the ED with complaints of altered mental status.  Patient was brought to the ER by her neighbor who noticed that she was having increasing confusion starting Sunday.  Patient's neighbor checked back today and she was more confused she was found in her bed, unaware of her surroundings.  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  Patient has history of neurogenic bladder and generally has to self catheterize herself. She takes Vicodin for chronic low back pain at home. Patient also takes clonazepam, Lyrica, gabapentin  She was admitted for AMS and started on abx for UTI.  Assessment & Plan:   Principal Problem:   Altered mental status Active Problems:   Uncontrolled type 2 diabetes mellitus with stage 3 chronic kidney disease, with long-term current use of insulin (HCC)   Mixed hyperlipidemia   ANXIETY DEPRESSION   HYPERTENSION, HEART CONTROLLED W/O ASSOC CHF   Cerebral vascular accident ([place])   COMBINED HEART FAILURE, CHRONIC   PACEMAKER, PERMANENT   Biventricular ICD (implantable cardioverter-defibrillator) in place  Acute Metabolic Encephalopathy - suspect this is related to her UTI below with possible contribution from some of her home potentially deliriogenic meds (clonazepam, lyrica, gabapentin, vicodin) -Treating underlying UTI - delirium is improving, [name]&Ox3, [name] little slow to respond - As needed Haldol - resume home clonazepam and norco - continue wellbutrin and effexor - may need to d/c some meds - on lyrica and gabapentin and xanax and klonopin on med list - will touch base with  pharmacy, but likely plan to simplify regimen by discontinuing some of these   Urine tract infection, with Temperence Zenor history of neurogenic bladder, self-catheterization -We will follow the urine cultures -foley was placed - will discontinue and resume I&O cath  Diabetes mellitus type 2 - lantus 16 units daily - SSI   Chronic diastolic CHF -continue Lasix 40 mg daily, Coreg 6.25 mg twice daily -I/O, daily weights  Chronic pain -Hip pain, back pain -chronic - c/o L hip pain today, no known fall - follow L hip plain films -> no fx/dislocation -> OA changes in hip L>R - resume home norco  History of Systolic HF  S/p Pacemaker Placement - EF improved in recent echo 01/18/20 -> EF 55-60% - s/p removal of biventricular ICD and replacement of biventricular pacemaker on 02/22/20 -She is to follow-up with cardiology as an outpatient. -We will continue home medication of digoxin, Coreg, statins, Lasix  DVT prophylaxis: lovenox Code Status: full  Family Communication: none at bedside Disposition:   Status is: Inpatient  Remains inpatient appropriate because:Inpatient level of care appropriate due to severity of illness   Dispo: The patient is from: Home              Anticipated d/c is to: Home              Anticipated d/c date is: 2 days              Patient currently is not medically stable to d/c.   Consultants:   none  Procedures:   none  Antimicrobials:  Anti-infectives (From admission, onward)  Start     Dose/Rate Route Frequency Ordered Stop   03/12/20 2200  cefTRIAXone (ROCEPHIN) 1 g in sodium chloride 0.9 % 100 mL IVPB     Discontinue     1 g 200 mL/hr over 30 Minutes Intravenous Every 24 hours 03/12/20 0308     03/11/20 2130  cefTRIAXone (ROCEPHIN) 1 g in sodium chloride 0.9 % 100 mL IVPB        1 g 200 mL/hr over 30 Minutes Intravenous  Once 03/11/20 2122 03/11/20 2248      Subjective: [name]&Ox3 C/o L hip pain, no fall Notes her thinking is clearer When her  neighbor found her, talking out of her head  Objective: Vitals:   03/12/20 2001 03/12/20 2058 03/13/20 0544 03/13/20 0951  BP:  131/70 (!) 146/71   Pulse:  86 93 97  Resp:  20 20   Temp:  98.2 F (36.8 C) 98.8 F (37.1 C)   TempSrc:  Oral Oral   SpO2: 99% 98% 97%   Weight:      Height:        Intake/Output Summary (Last 24 hours) at 03/13/2020 1132 Last data filed at 03/13/2020 1000 Gross per 24 hour  Intake 533.02 ml  Output 2000 ml  Net -1466.98 ml   Filed Weights   03/11/20 1737  Weight: 59 kg    Examination:  General exam: Appears calm and comfortable  Respiratory system: Clear to auscultation. Respiratory effort normal. Cardiovascular system: S1 & S2 heard, RRR. Gastrointestinal system: Abdomen is nondistended, soft and nontender. Central nervous system: Alert and oriented. No focal neurological deficits. Extremities: no LEE, L hip pain with palpation (able to lift and move LLE without apparent difficulty) Skin: No rashes, lesions or ulcers Psychiatry: Judgement and insight appear normal. Mood & affect appropriate.     Data Reviewed: I have personally reviewed following labs and imaging studies  CBC: Recent Labs  Lab 03/11/20 1756 03/12/20 0425 03/13/20 0448  WBC 5.9 5.8 5.9  NEUTROABS 3.5  --   --   HGB 12.8 11.6* 12.4  HCT 40.1 37.0 39.7  MCV 96.2 99.2 99.0  PLT 278 247 706    Basic Metabolic Panel: Recent Labs  Lab 03/11/20 1756 03/12/20 0425 03/13/20 0448  NA 142 142 139  K 4.1 3.7 3.7  CL 106 107 102  CO2 23 24 27   GLUCOSE 234* 189* 195*  BUN 32* 28* 23  CREATININE 1.60* 1.42* 1.48*  CALCIUM 9.3 8.6* 9.1    GFR: Estimated Creatinine Clearance: 30.5 mL/min ([name]) (by C-G formula based on SCr of 1.48 mg/dL (H)).  Liver Function Tests: Recent Labs  Lab 03/11/20 1756 03/12/20 0425  AST 40 33  ALT 47* 39  ALKPHOS 112 97  BILITOT 0.9 0.5  PROT 7.4 6.5  ALBUMIN 4.3 3.7    CBG: Recent Labs  Lab 03/12/20 0738 03/12/20 1118  03/12/20 1638 03/12/20 2011 03/13/20 0753  GLUCAP 170* 190* 183* 153* 130*     Recent Results (from the past 240 hour(s))  Urine culture     Status: Abnormal (Preliminary result)   Collection Time: 03/11/20  5:47 PM   Specimen: Urine, Random  Result Value Ref Range Status   Specimen Description   Final    URINE, RANDOM Performed at [facility], [address], [place], [place] [address]    Special Requests   Final    NONE Performed at [facility], [address] [place], [place] [address]    Culture ([name])  Final    >=100,000 COLONIES/mL ENTEROCOCCUS FAECALIS SUSCEPTIBILITIES TO FOLLOW Performed at [facility], [place] [address], [place], [place] [address]    Report Status PENDING  Incomplete  SARS Coronavirus 2 by RT PCR (hospital order, performed in [facility] hospital lab) Nasopharyngeal Nasopharyngeal Swab     Status: None   Collection Time: 03/12/20  1:30 AM   Specimen: Nasopharyngeal Swab  Result Value Ref Range Status   SARS Coronavirus 2 NEGATIVE NEGATIVE Final    Comment: (NOTE) SARS-CoV-2 target nucleic acids are NOT DETECTED.  The SARS-CoV-2 RNA is generally detectable in upper and lower respiratory specimens during the acute phase of infection. The lowest concentration of SARS-CoV-2 viral copies this assay can detect is 250 copies / mL. [name] negative result does not preclude SARS-CoV-2 infection and should not be used as the sole basis for treatment or other patient management decisions.  [name] negative result may occur with improper specimen collection / handling, submission of specimen other than nasopharyngeal swab, presence of viral mutation(s) within the areas targeted by this assay, and inadequate number of viral copies (<250 copies / mL). [name] negative result must be combined with clinical observations, patient history, and epidemiological information.  Fact Sheet for Patients:   StrictlyIdeas.no  Fact Sheet for Healthcare  Providers: BankingDealers.co.za  This test is not yet approved or  cleared by the [place] FDA and has been authorized for detection and/or diagnosis of SARS-CoV-2 by FDA under an Emergency Use Authorization (EUA).  This EUA will remain in effect (meaning this test can be used) for the duration of the COVID-19 declaration under Section 564(b)(1) of the Act, 21 U.S.C. section 360bbb-3(b)(1), unless the authorization is terminated or revoked sooner.  Performed at [facility], [address], [place], [place] [address]          Radiology Studies: CT Head Wo Contrast  Result Date: 03/11/2020 CLINICAL DATA:  Altered mental status EXAM: CT HEAD WITHOUT CONTRAST TECHNIQUE: Contiguous axial images were obtained from the base of the skull through the vertex without intravenous contrast. COMPARISON:  03/15/2019 FINDINGS: Brain: No acute intracranial abnormality. Specifically, no hemorrhage, hydrocephalus, mass lesion, acute infarction, or significant intracranial injury. Vascular: No hyperdense vessel or unexpected calcification. Skull: No acute calvarial abnormality. Sinuses/Orbits: Visualized paranasal sinuses and mastoids clear. Orbital soft tissues unremarkable. Other: None IMPRESSION: Normal study. Electronically Signed   By: Rolm Baptise M.D.   On: 03/11/2020 20:21   DG HIP UNILAT WITH PELVIS 2-3 VIEWS LEFT  Result Date: 03/13/2020 CLINICAL DATA:  Pain EXAM: DG HIP (WITH OR WITHOUT PELVIS) 2-3V LEFT COMPARISON:  September 30, 2019 FINDINGS: Frontal pelvis as well as frontal and lateral left hip images obtained. There is no evident fracture or dislocation. There is relatively mild symmetric narrowing of each hip joint. There are calcifications along the lateral aspect of the left hip joint likely due to arthropathy and potential tendinosis. No erosive changes. Postoperative change noted in the lower lumbar region. IMPRESSION: Osteoarthritic change in each hip joint,  slightly more severe on the left than on the right, stable. No fracture or dislocation. Postoperative change lower lumbar spine. Electronically Signed   By: Lowella Grip III M.D.   On: 03/13/2020 11:07        Scheduled Meds: . aspirin EC  81 mg Oral Daily  . buPROPion  150 mg Oral Daily  . carvedilol  6.25 mg Oral BID WC  . Chlorhexidine Gluconate Cloth  6 each Topical Daily  . digoxin  125 mcg Oral Daily  . enoxaparin (LOVENOX) injection  30 mg Subcutaneous Q24H  . furosemide  40 mg Oral Daily  . insulin aspart  0-9 Units Subcutaneous TID WC  . insulin glargine  16 Units Subcutaneous QHS  . pantoprazole  40 mg Oral Daily  . polyethylene glycol  17 g Oral BID  . potassium chloride SA  20 mEq Oral Daily  . rosuvastatin  40 mg Oral Daily  . sodium chloride flush  3 mL Intravenous Q12H  . venlafaxine XR  300 mg Oral q morning - 10a  . zolpidem  5 mg Oral QHS   Continuous Infusions: . sodium chloride    . cefTRIAXone (ROCEPHIN)  IV 1 g (03/12/20 2219)     LOS: 1 day    Time spent: over 30 min    Fayrene Helper, MD [facility]   To contact the attending provider between 7A-7P or the covering provider during after hours 7P-7A, please log into the web site [email or web address] and access using universal [place] password for that web site. If you do not have the password, please call the hospital operator.  03/13/2020, 11:32 AM

## 2020-03-13 NOTE — Progress Notes (Signed)
New order for Ambien 5 mg PRN received from [facility] NP. Will administer and continue to monitor.

## 2020-03-13 NOTE — Evaluation (Signed)
Physical Therapy Evaluation Patient Details Name: [name] MRN: [id number] DOB: 1951/04/05 Today's Date: 03/13/2020   History of Present Illness  [name]  is a 69 y.o. female, with medical history of neurogenic bladder, chronic systolic and diastolic heart failure, s/p ICD, diabetes mellitus type 2 was brought to the ED with complaints of altered mental status.  Patient was brought to the ER by her neighbor who noticed that she was having increasing confusion starting Sunday.  Patient's neighbor checked back today and she was more confused she was found in her bed, unaware of her surroundings.  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  Patient has history of neurogenic bladder and generally has to self catheterize herself.In the ED patient mental status improved, she is alert and has only mild confusion now.She is was found to abnormal UA, started on IV RocephinShe takes Vicodin for chronic low back pain at home.Patient also takes clonazepam, Lyrica, gabapentinShe complains of dysuriaDenies abdominal painDenies chest pain or shortness of breath    Clinical Impression  Patient demonstrates good return for moving LLE during bed mobility, able to ambulate without AD with slow labored cadence and limited weightbearing on RLE due to increasing pain, had to use RW for safety to return to room and patient tolerated sitting up in chair after therapy.  Patient will benefit from continued physical therapy in hospital and recommended venue below to increase strength, balance, endurance for safe ADLs and gait.    Follow Up Recommendations Home health PT;Supervision for mobility/OOB;Supervision - Intermittent    Equipment Recommendations  None recommended by PT    Recommendations for Other Services       Precautions / Restrictions Precautions Precautions: Fall Restrictions Weight Bearing Restrictions: No      Mobility  Bed Mobility Overal bed mobility:  Modified Independent                Transfers Overall transfer level: Needs assistance Equipment used: Rolling walker (2 wheeled);None Transfers: Sit to/from [employer] to Stand: Supervision Stand pivot transfers: Supervision       General transfer comment: increased time, labored movement  Ambulation/Gait Ambulation/Gait assistance: Supervision;Min guard Gait Distance (Feet): 50 Feet Assistive device: Rolling walker (2 wheeled);None Gait Pattern/deviations: Decreased step length - right;Decreased step length - left;Decreased stride length Gait velocity: decreased   General Gait Details: slow labored cadence, ambulated half the distance without AD, once left hip increased used RW due decreased stride length and slowing of cadence, able to make it back to room without loss of balance  Stairs            Wheelchair Mobility    Modified Rankin (Stroke Patients Only)       Balance Overall balance assessment: Needs assistance Sitting-balance support: Feet supported;No upper extremity supported Sitting balance-Leahy Scale: Good Sitting balance - Comments: seated at EOB   Standing balance support: During functional activity;No upper extremity supported Standing balance-Leahy Scale: Fair Standing balance comment: fair/good using RW                             Pertinent Vitals/Pain Pain Assessment: 0-10 Pain Score: 8  Pain Location: left hip Pain Descriptors / Indicators: Sore;Discomfort Pain Intervention(s): Limited activity within patient's tolerance;Monitored during session;Patient requesting pain meds-RN notified    Home Living Family/patient expects to be discharged to:: Private residence Living Arrangements: Spouse/significant other Available Help at Discharge: Family;Available 24 hours/day Type  of Home: House Home Access: [place]: One level Home Equipment: [place] - 2 wheels;Wheelchair -  manual;Shower seat;Bedside commode      Prior Function Level of Independence: Independent         Comments: Hydrographic surveyor, drives     Journalist, newspaper        Extremity/Trunk Assessment   Upper Extremity Assessment Upper Extremity Assessment: Overall WFL for tasks assessed    Lower Extremity Assessment Lower Extremity Assessment: Generalized weakness    Cervical / Trunk Assessment Cervical / Trunk Assessment: Normal  Communication   Communication: No difficulties  Cognition Arousal/Alertness: Awake/alert Behavior During Therapy: WFL for tasks assessed/performed Overall Cognitive Status: Within Functional Limits for tasks assessed                                        General Comments      Exercises     Assessment/Plan    PT Assessment Patient needs continued PT services  PT Problem List Decreased strength;Decreased activity tolerance;Decreased balance;Decreased mobility       PT Treatment Interventions Balance training;Gait training;Stair training;Functional mobility training;Therapeutic activities;Therapeutic exercise;Patient/family education    PT Goals (Current goals can be found in the Care Plan section)  Acute Rehab PT Goals Patient Stated Goal: return home with family to assist PT Goal Formulation: With patient Time For Goal Achievement: 03/15/20 Potential to Achieve Goals: Good    Frequency Min 3X/week   Barriers to discharge        Co-evaluation               AM-PAC PT "6 Clicks" Mobility  Outcome Measure Help needed turning from your back to your side while in a flat bed without using bedrails?: None Help needed moving from lying on your back to sitting on the side of a flat bed without using bedrails?: None Help needed moving to and from a bed to a chair (including a wheelchair)?: A Little Help needed standing up from a chair using your arms (e.g., wheelchair or bedside chair)?: A Little Help needed to walk in  hospital room?: A Little Help needed climbing 3-5 steps with a railing? : A Little 6 Click Score: 20    End of Session   Activity Tolerance: Patient tolerated treatment well;Patient limited by fatigue Patient left: in chair;with call bell/phone within reach Nurse Communication: Mobility status PT Visit Diagnosis: Other abnormalities of gait and mobility (R26.89);Unsteadiness on feet (R26.81);Muscle weakness (generalized) (M62.81)    Time: 8563-1497 PT Time Calculation (min) (ACUTE ONLY): 17 min   Charges:   PT Evaluation $PT Eval Low Complexity: 1 Low PT Treatments $Therapeutic Activity: 8-22 mins        1:52 PM, 03/13/20 Lonell Grandchild, MPT Physical Therapist with [facility] 336 [phone] office [phone] mobile phone

## 2020-03-13 NOTE — Plan of Care (Signed)
  Problem: Acute Rehab PT Goals(only PT should resolve) Goal: Pt Will Go Supine/Side To Sit Outcome: Progressing Flowsheets (Taken 03/13/2020 1353) Pt will go Supine/Side to Sit:  Independently  with modified independence Goal: Patient Will Transfer Sit To/From Stand Outcome: Progressing Flowsheets (Taken 03/13/2020 1353) Patient will transfer sit to/from stand:  with supervision  with modified independence Goal: Pt Will Transfer Bed To Chair/Chair To Bed Outcome: Progressing Flowsheets (Taken 03/13/2020 1353) Pt will Transfer Bed to Chair/Chair to Bed:  with modified independence  with supervision Goal: Pt Will Ambulate Outcome: Progressing Flowsheets (Taken 03/13/2020 1353) Pt will Ambulate:  with modified independence  with supervision  100 feet  with rolling walker   1:54 PM, 03/13/20 Lonell Grandchild, MPT Physical Therapist with [facility] 336 [phone] office [phone] mobile phone

## 2020-03-13 NOTE — Progress Notes (Signed)
PHARMACY - PHYSICIAN COMMUNICATION CRITICAL VALUE ALERT - BLOOD CULTURE IDENTIFICATION (BCID)  [name] is an 69 y.o. female who presented to [facility] on 03/11/2020   Assessment:  Enterococcus faecalis UTI- sensitivities pending  Name of physician (or Provider) Contacted: Dr. Florene Glen  Current antibiotics: Ceftriaxone 1000 mg IV every 24 hours.  Changes to prescribed antibiotics recommended:  Recommendations accepted by provider- change to ampicillin 1000 mg IV every 8 hours. 99% sensitivity per antibiogram  No results found for this or any previous visit.  Ramond Craver 03/13/2020  3:29 PM

## 2020-03-14 LAB — URINE CULTURE: Culture: >=100000 — AB

## 2020-03-14 LAB — CBC
HCT: 38.9 % (ref 36.0–46.0)
Hemoglobin: 12.4 g/dL (ref 12.0–15.0)
MCH: 30.6 pg (ref 26.0–34.0)
MCHC: 31.9 g/dL (ref 30.0–36.0)
MCV: 96 fL (ref 80.0–100.0)
Platelets: 225 10*3/uL (ref 150–400)
RBC: 4.05 MIL/uL (ref 3.87–5.11)
RDW: 13 % (ref 11.5–15.5)
WBC: 6.4 10*3/uL (ref 4.0–10.5)
nRBC: 0 % (ref 0.0–0.2)

## 2020-03-14 LAB — GLUCOSE, CAPILLARY
Glucose-Capillary: 158 mg/dL — ABNORMAL HIGH (ref 70–99)
Glucose-Capillary: 218 mg/dL — ABNORMAL HIGH (ref 70–99)

## 2020-03-14 LAB — COMPREHENSIVE METABOLIC PANEL
ALT: 41 U/L (ref 0–44)
AST: 35 U/L (ref 15–41)
Albumin: 4 g/dL (ref 3.5–5.0)
Alkaline Phosphatase: 100 U/L (ref 38–126)
Anion gap: 12 (ref 5–15)
BUN: 23 mg/dL (ref 8–23)
CO2: 26 mmol/L (ref 22–32)
Calcium: 9.4 mg/dL (ref 8.9–10.3)
Chloride: 102 mmol/L (ref 98–111)
Creatinine, Ser: 1.38 mg/dL — ABNORMAL HIGH (ref 0.44–1.00)
GFR calc Af Amer: 46 mL/min — ABNORMAL LOW (ref >60)
GFR calc non Af Amer: 39 mL/min — ABNORMAL LOW (ref >60)
Glucose, Bld: 135 mg/dL — ABNORMAL HIGH (ref 70–99)
Potassium: 3.4 mmol/L — ABNORMAL LOW (ref 3.5–5.1)
Sodium: 140 mmol/L (ref 135–145)
Total Bilirubin: 0.8 mg/dL (ref 0.3–1.2)
Total Protein: 7 g/dL (ref 6.5–8.1)

## 2020-03-14 MED ORDER — ZOLPIDEM TARTRATE 10 MG PO TABS: 5.0000 mg | ORAL_TABLET | Freq: Every day | ORAL | 0 refills | Status: DC

## 2020-03-14 MED ORDER — LACTATED RINGERS IV BOLUS: 500.0000 mL | Freq: Once | INTRAVENOUS | Status: DC

## 2020-03-14 MED ORDER — AMOXICILLIN-POT CLAVULANATE 875-125 MG PO TABS: 1.0000 | ORAL_TABLET | Freq: Two times a day (BID) | ORAL | 0 refills | Status: AC

## 2020-03-14 NOTE — Discharge Summary (Signed)
Physician Discharge Summary  [name] OXB:[id number] DOB: 03-30-1951 DOA: 03/11/2020  PCP: Melanie Hancock, MD  Admit date: 03/11/2020 Discharge date: 03/14/2020  Time spent: 40 minutes  Recommendations for Outpatient Follow-up:  1. Follow outpatient CBC/CMP 2. Complete course of abx outpatient 3. Follow potentially deliriogenic meds outpatient - lyrica has been d/c'd - she's on norco, clonazepam, ambien, and gabapentin - follow with outpatient provider for adjustments to these meds 4. Follow HR outpatient - tachycardia improved with IVF prior to discharge  5. Follow hip pain outpatient   Discharge Diagnoses:  Principal Problem:   Altered mental status Active Problems:   Uncontrolled type 2 diabetes mellitus with stage 3 chronic kidney disease, with long-term current use of insulin (HCC)   Mixed hyperlipidemia   ANXIETY DEPRESSION   HYPERTENSION, HEART CONTROLLED W/O ASSOC CHF   Cerebral vascular accident ([place])   COMBINED HEART FAILURE, CHRONIC   PACEMAKER, PERMANENT   Biventricular ICD (implantable cardioverter-defibrillator) in place   Discharge Condition: stable  Diet recommendation: hearthealthy  Filed Weights   03/11/20 1737  Weight: 59 kg    History of present illness:  JanetWashburnis a74 y.o.female,with medical history of neurogenic bladder, chronic systolic and diastolic heart failure, s/p ICD, diabetes mellitus type 2 was brought to the ED with complaints of altered mental status. Patient was brought to the ER by her neighbor who noticed that she was having increasing confusion starting Sunday. Patient's neighbor checked back today and she was more confused she was found in her bed, unaware of her surroundings. 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 Patient has history of neurogenic bladder and generally has to self catheterize herself. She takes Vicodin for chronic low back pain at home. Patient also takes clonazepam,  Lyrica, gabapentin  She was admitted for AMS and started on abx for UTI.  She's improved on antibiotics for UTI.  Discharged with augmentin for enterococcus faecalis sensitive to ampicillin.  Home health ordered.  See below for additional details  Hospital Course:  Acute Metabolic Encephalopathy - suspect this is related to her UTI below with possible contribution from some of her home potentially deliriogenic meds (clonazepam, lyrica, gabapentin, vicodin) -Treating underlying UTI - she's improved today, encephalopathy resolved - resume home clonazepam and norco - continue wellbutrin and effexor - may need to d/c some meds - lyrica d/c'd, continue clonazepam and norco for now.  She's also on ambien (decrease to 5 mg).  She'll need to discuss these meds with her PCP long term as they could contribute to confusion.   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 -We will continue home medication of digoxin, Coreg, statins, Lasix  Tachycardia: improved with IVF today on day  of discharge  Procedures:  none  Consultations:  none  Discharge Exam: Vitals:   03/14/20 0513 03/14/20 0514  BP: (!) 140/91   Pulse:  (!) 112 (!) 107  Resp: 20   Temp: 98.6 F (37 C)   SpO2: 100% 98%  Per discussion with RN, after bolus, HR down to 80's when pt up and about  Eager for discharge No new complaints  General: No acute distress. Cardiovascular: Heart sounds show [name] regular rate, and rhythm. Lungs: Clear to auscultation bilaterally Abdomen: Soft, nontender, nondistende Neurological: Alert and oriented 3. Moves all extremities 4. Cranial nerves II through XII grossly intact. Skin: Warm and dry. No rashes or lesions. Extremities: No clubbing or cyanosis. No edema.   Discharge Instructions   Discharge Instructions    Call MD for:  difficulty breathing, headache or visual disturbances   Complete by: As directed    Call MD for:  extreme fatigue   Complete by: As directed    Call MD for:  hives   Complete by: As directed    Call MD for:  persistant dizziness or light-headedness   Complete by: As directed    Call MD for:  persistant nausea and vomiting   Complete by: As directed    Call MD for:  redness, tenderness, or signs of infection (pain, swelling, redness, odor or green/yellow discharge around incision site)   Complete by: As directed    Call MD for:  severe uncontrolled pain   Complete by: As directed    Call MD for:  temperature >100.4   Complete by: As directed    Diet - low sodium heart healthy   Complete by: As directed    Diet - low sodium heart healthy   Complete by: As directed    Discharge instructions   Complete by: As directed    You were seen for confusion which is likely related to [name] urinary tract infection.  You've improved with treatment.  We're going to discharge you with [name] 7 day course of antibiotics.  You have some medications that could worsen your confusion.  Stop the lyrica.  The hydrocodone, clonazepam, gabapentin, and ambien could all cause confusion individually or in combination.  Use these with caution and consider [name] discussion with your PCP regarding these  medications.  Your ambien dose is higher than recommended.  Please use 5 mg in the future and discuss this with your PCP in follow up.  Return for new, recurrent, or worsening symptoms.  Please ask your PCP to request records from this hospitalization so they know what was done and what the next steps will be.   Increase activity slowly   Complete by: As directed    Increase activity slowly   Complete by: As directed    No wound care   Complete by: As directed    No wound care   Complete by: As directed      Allergies as of 03/14/2020   No Known Allergies     Medication List    STOP taking these medications   pregabalin 75 MG capsule Commonly known as: LYRICA     TAKE these medications   amoxicillin-clavulanate 875-125 MG tablet Commonly known as: Augmentin Take 1 tablet by mouth 2 (two) times daily for 7 days.   aspirin EC 81 MG tablet Take 81 mg by mouth daily.   buPROPion 150 MG 12 hr tablet Commonly known as: WELLBUTRIN SR Take 150 mg by mouth daily.   Bydureon  BCise 2 MG/0.85ML Auij Generic drug: Exenatide ER Inject 2 mg into the muscle every 14 (fourteen) days.   carvedilol 6.25 MG tablet Commonly known as: COREG TAKE ONE TABLET BY MOUTH TWICE [name] DAY WITH [name] MEAL What changed: See the new instructions.   clonazePAM 1 MG tablet Commonly known as: KLONOPIN Take 1 mg by mouth 2 (two) times daily as needed.   digoxin 0.125 MG tablet Commonly known as: LANOXIN Take 1 tablet (125 mcg total) by mouth daily.   furosemide 40 MG tablet Commonly known as: LASIX Take 1 tablet (40 mg total) by mouth daily.   gabapentin 600 MG tablet Commonly known as: NEURONTIN Take 600 mg by mouth at bedtime.   HYDROcodone-acetaminophen 10-325 MG tablet Commonly known as: NORCO Take 1-2 tablets by mouth every 6 (six) hours as needed for moderate pain.   insulin lispro 100 UNIT/ML KwikPen Commonly known as: HumaLOG KwikPen INJECT UP TO 50 UNITS DAILY What changed:   how  much to take  how to take this  when to take this  additional instructions   Lantus SoloStar 100 UNIT/ML Solostar Pen Generic drug: insulin glargine INJECT 50 UNITS INTO THE SKIN DAILY AT 10:00PM What changed: See the new instructions.   Lidocaine 4 % Ptch Apply 1 patch topically 2 (two) times daily. What changed:   when to take this  reasons to take this   omeprazole 20 MG capsule Commonly known as: PRILOSEC TAKE ONE CAPSULE BY MOUTH TWICE Mckynlee Luse DAY What changed: when to take this   potassium chloride SA 20 MEQ tablet Commonly known as: KLOR-CON Take 20 mEq by mouth 2 (two) times daily.   rosuvastatin 40 MG tablet Commonly known as: CRESTOR TAKE ONE (1) TABLET BY MOUTH EVERY DAY (STOP ATORVASTATIN) What changed: See the new instructions.   SYSTANE CONTACTS OP Place 1 drop into both eyes 2 (two) times daily.   venlafaxine XR 75 MG 24 hr capsule Commonly known as: EFFEXOR-XR Take 150 mg by mouth daily.   zolpidem 10 MG tablet Commonly known as: AMBIEN Take 0.5 tablets (5 mg total) by mouth at bedtime. What changed: how much to take      No Known Allergies  Follow-up Information    [facility] Follow [facility].   Specialty: Home Health Services Why: PT               The results of significant diagnostics from this hospitalization (including imaging, microbiology, ancillary and laboratory) are listed below for reference.    Significant Diagnostic Studies: CT Head Wo Contrast  Result Date: 03/11/2020 CLINICAL DATA:  Altered mental status EXAM: CT HEAD WITHOUT CONTRAST TECHNIQUE: Contiguous axial images were obtained from the base of the skull through the vertex without intravenous contrast. COMPARISON:  03/15/2019 FINDINGS: Brain: No acute intracranial abnormality. Specifically, no hemorrhage, hydrocephalus, mass lesion, acute infarction, or significant intracranial injury. Vascular: No hyperdense vessel or unexpected calcification. Skull: No  acute calvarial abnormality. Sinuses/Orbits: Visualized paranasal sinuses and mastoids clear. Orbital soft tissues unremarkable. Other: None IMPRESSION: Normal study. Electronically Signed   By: Rolm Baptise M.D.   On: 03/11/2020 20:21   EP PPM/ICD IMPLANT  Result Date: 02/22/2020 Conclusion: Successful removal of [name] Schuh previously implanted biventricular ICD which had reached elective replacement, and insertion of [name] new biventricular pacemaker with relocation of the pacemaker pocket from the subcutaneous to the subpectoral location. Melanie Peru, MD  CUP PACEART [facility] DEVICE CHECK  Result Date: 03/05/2020 CRT-P  wound check and device  check in clinic. Steri strips removed, wound edges approximated, edema present at wound site that is reported to be decreasing by patient. No drainage ir redness at incision site. Normal device function. Thresholds, sensing,  impedance consistent with previous measurements. Histograms appropriate for patient and level of activity. No mode switches or ventricular high rate episodes. Patient bi-ventricularly pacing 97.85% of the time. Device programmed with appropriate safety margins. Device heart failure diagnostics are within normal limits and stable over time. Estimated longevity 7 yrs 11 months. Patient enrolled in remote follow-up and next remote scheduled for 05/24/20. Plan to check device remotely every  3 months. Follow up with [facility] 04/10/20 for preop clearance.  CUP PACEART REMOTE DEVICE CHECK  Result Date: 02/22/2020 Activation. Materials engineer. Normal funciton.  DG HIP UNILAT WITH PELVIS 2-3 VIEWS LEFT  Result Date: 03/13/2020 CLINICAL DATA:  Pain EXAM: DG HIP (WITH OR WITHOUT PELVIS) 2-3V LEFT COMPARISON:  September 30, 2019 FINDINGS: Frontal pelvis as well as frontal and lateral left hip images obtained. There is no evident fracture or dislocation. There is relatively mild symmetric narrowing of each hip joint. There are calcifications along the lateral aspect of the  left hip joint likely due to arthropathy and potential tendinosis. No erosive changes. Postoperative change noted in the lower lumbar region. IMPRESSION: Osteoarthritic change in each hip joint, slightly more severe on the left than on the right, stable. No fracture or dislocation. Postoperative change lower lumbar spine. Electronically Signed   By: Lowella Grip III M.D.   On: 03/13/2020 11:07    Microbiology: Recent Results (from the past 240 hour(s))  Urine culture     Status: Abnormal   Collection Time: 03/11/20  5:47 PM   Specimen: Urine, Random  Result Value Ref Range Status   Specimen Description   Final    URINE, RANDOM Performed at [facility], [address], [place], [place] [address]    Special Requests   Final    NONE Performed at [facility], [address] [place], [place] [address]    Culture >=100,000 COLONIES/mL ENTEROCOCCUS FAECALIS ([name])  Final   Report Status 03/14/2020 FINAL  Final   Organism ID, Bacteria ENTEROCOCCUS FAECALIS ([name])  Final      Susceptibility   Enterococcus faecalis - MIC*    AMPICILLIN <=2 SENSITIVE Sensitive     NITROFURANTOIN <=16 SENSITIVE Sensitive     VANCOMYCIN 1 SENSITIVE Sensitive     * >=100,000 COLONIES/mL ENTEROCOCCUS FAECALIS  SARS Coronavirus 2 by RT PCR (hospital order, performed in [place] hospital lab) Nasopharyngeal Nasopharyngeal Swab     Status: None   Collection Time: 03/12/20  1:30 AM   Specimen: Nasopharyngeal Swab  Result Value Ref Range Status   SARS Coronavirus 2 NEGATIVE NEGATIVE Final    Comment: (NOTE) SARS-CoV-2 target nucleic acids are NOT DETECTED.  The SARS-CoV-2 RNA is generally detectable in upper and lower respiratory specimens during the acute phase of infection. The lowest concentration of SARS-CoV-2 viral copies this assay can detect is 250 copies / mL. [name] negative result does not preclude SARS-CoV-2 infection and should not be used as the sole basis for treatment or other patient management  decisions.  [name] negative result may occur with improper specimen collection / handling, submission of specimen other than nasopharyngeal swab, presence of viral mutation(s) within the areas targeted by this assay, and inadequate number of viral copies (<250 copies / mL). [name] negative result must be combined with clinical observations, patient history, and epidemiological information.  Fact Sheet for Patients:   StrictlyIdeas.no  Fact Sheet for Healthcare Providers: BankingDealers.co.za  This test is not yet approved or  cleared by the [place] FDA and has been authorized for detection and/or diagnosis of SARS-CoV-2 by FDA under an Emergency Use Authorization (EUA).  This EUA will remain in effect (meaning this test can be used) for the duration of the COVID-19 declaration under Section 564(b)(1) of the Act, 21 U.S.C. section 360bbb-3(b)(1), unless the authorization is terminated or revoked sooner.  Performed at [facility], [address], [place], [place] [address]      Labs: Basic Metabolic Panel: Recent Labs  Lab 03/11/20 1756 03/12/20 0425 03/13/20 0448 03/14/20 0628  NA 142 142 139 140  K 4.1 3.7 3.7 3.4*  CL 106 107 102 102  CO2 23 24 27 26   GLUCOSE 234* 189* 195* 135*  BUN 32* 28* 23 23  CREATININE 1.60* 1.42* 1.48* 1.38*  CALCIUM 9.3 8.6* 9.1 9.4   Liver Function Tests: Recent Labs  Lab 03/11/20 1756 03/12/20 0425 03/14/20 0628  AST 40 33 35  ALT 47* 39 41  ALKPHOS 112 97 100  BILITOT 0.9 0.5 0.8  PROT 7.4 6.5 7.0  ALBUMIN 4.3 3.7 4.0   No results for input(s): LIPASE, AMYLASE in the last 168 hours. No results for input(s): AMMONIA in the last 168 hours. CBC: Recent Labs  Lab 03/11/20 1756 03/12/20 0425 03/13/20 0448 03/14/20 0628  WBC 5.9 5.8 5.9 6.4  NEUTROABS 3.5  --   --   --   HGB 12.8 11.6* 12.4 12.4  HCT 40.1 37.0 39.7 38.9  MCV 96.2 99.2 99.0 96.0  PLT 278 247 238 225   Cardiac  Enzymes: No results for input(s): CKTOTAL, CKMB, CKMBINDEX, TROPONINI in the last 168 hours. BNP: BNP (last 3 results) No results for input(s): BNP in the last 8760 hours.  ProBNP (last 3 results) No results for input(s): PROBNP in the last 8760 hours.  CBG: Recent Labs  Lab 03/13/20 1146 03/13/20 1713 03/13/20 2102 03/14/20 0741 03/14/20 1115  GLUCAP 150* 103* 188* 158* 218*       Signed:  Fayrene Helper MD.  [facility] 03/14/2020, 6:56 PM

## 2020-03-14 NOTE — TOC Progression Note (Signed)
Transition of Care [facility]) - Progression Note    Patient Details  Name: [name] MRN: [id number] Date of Birth: May 25, 1951  Transition of Care [facility]) CM/SW Contact  Natasha Bence, LCSW Phone Number: 03/14/2020, 10:34 AM  Clinical Narrative:     Patient is agreeable to [facility] services with [facility]. Linda with [facility] agreeable to taking patient. TOC to follow.        Expected Discharge Plan and Services           Expected Discharge Date: 03/14/20                                     Social Determinants of Health (SDOH) Interventions    Readmission Risk Interventions No flowsheet data found.

## 2020-03-19 DIAGNOSIS — Z7982 Long term (current) use of aspirin: Secondary | ICD-10-CM | POA: Diagnosis not present

## 2020-03-19 DIAGNOSIS — K5909 Other constipation: Secondary | ICD-10-CM | POA: Diagnosis not present

## 2020-03-19 DIAGNOSIS — M545 Low back pain: Secondary | ICD-10-CM | POA: Diagnosis not present

## 2020-03-19 DIAGNOSIS — Z466 Encounter for fitting and adjustment of urinary device: Secondary | ICD-10-CM | POA: Diagnosis not present

## 2020-03-19 DIAGNOSIS — E7800 Pure hypercholesterolemia, unspecified: Secondary | ICD-10-CM | POA: Diagnosis not present

## 2020-03-19 DIAGNOSIS — N319 Neuromuscular dysfunction of bladder, unspecified: Secondary | ICD-10-CM | POA: Diagnosis not present

## 2020-03-19 DIAGNOSIS — N183 Chronic kidney disease, stage 3 unspecified: Secondary | ICD-10-CM | POA: Diagnosis not present

## 2020-03-19 DIAGNOSIS — E1122 Type 2 diabetes mellitus with diabetic chronic kidney disease: Secondary | ICD-10-CM | POA: Diagnosis not present

## 2020-03-19 DIAGNOSIS — B952 Enterococcus as the cause of diseases classified elsewhere: Secondary | ICD-10-CM | POA: Diagnosis not present

## 2020-03-19 DIAGNOSIS — I5043 Acute on chronic combined systolic (congestive) and diastolic (congestive) heart failure: Secondary | ICD-10-CM | POA: Diagnosis not present

## 2020-03-19 DIAGNOSIS — F419 Anxiety disorder, unspecified: Secondary | ICD-10-CM | POA: Diagnosis not present

## 2020-03-19 DIAGNOSIS — G9341 Metabolic encephalopathy: Secondary | ICD-10-CM | POA: Diagnosis not present

## 2020-03-19 DIAGNOSIS — I110 Hypertensive heart disease with heart failure: Secondary | ICD-10-CM | POA: Diagnosis not present

## 2020-03-19 DIAGNOSIS — Z8673 Personal history of transient ischemic attack (TIA), and cerebral infarction without residual deficits: Secondary | ICD-10-CM | POA: Diagnosis not present

## 2020-03-19 DIAGNOSIS — K219 Gastro-esophageal reflux disease without esophagitis: Secondary | ICD-10-CM | POA: Diagnosis not present

## 2020-03-19 DIAGNOSIS — F329 Major depressive disorder, single episode, unspecified: Secondary | ICD-10-CM | POA: Diagnosis not present

## 2020-03-19 DIAGNOSIS — Z794 Long term (current) use of insulin: Secondary | ICD-10-CM | POA: Diagnosis not present

## 2020-03-19 DIAGNOSIS — Z79891 Long term (current) use of opiate analgesic: Secondary | ICD-10-CM | POA: Diagnosis not present

## 2020-03-19 DIAGNOSIS — N390 Urinary tract infection, site not specified: Secondary | ICD-10-CM | POA: Diagnosis not present

## 2020-03-19 DIAGNOSIS — Z95810 Presence of automatic (implantable) cardiac defibrillator: Secondary | ICD-10-CM | POA: Diagnosis not present

## 2020-03-19 DIAGNOSIS — E782 Mixed hyperlipidemia: Secondary | ICD-10-CM | POA: Diagnosis not present

## 2020-03-19 DIAGNOSIS — G8929 Other chronic pain: Secondary | ICD-10-CM | POA: Diagnosis not present

## 2020-03-20 DIAGNOSIS — R4182 Altered mental status, unspecified: Secondary | ICD-10-CM | POA: Diagnosis not present

## 2020-03-23 DIAGNOSIS — I110 Hypertensive heart disease with heart failure: Secondary | ICD-10-CM | POA: Diagnosis not present

## 2020-03-23 DIAGNOSIS — B952 Enterococcus as the cause of diseases classified elsewhere: Secondary | ICD-10-CM | POA: Diagnosis not present

## 2020-03-23 DIAGNOSIS — N319 Neuromuscular dysfunction of bladder, unspecified: Secondary | ICD-10-CM | POA: Diagnosis not present

## 2020-03-23 DIAGNOSIS — G9341 Metabolic encephalopathy: Secondary | ICD-10-CM | POA: Diagnosis not present

## 2020-03-23 DIAGNOSIS — N390 Urinary tract infection, site not specified: Secondary | ICD-10-CM | POA: Diagnosis not present

## 2020-03-23 DIAGNOSIS — Z466 Encounter for fitting and adjustment of urinary device: Secondary | ICD-10-CM | POA: Diagnosis not present

## 2020-03-28 DIAGNOSIS — I110 Hypertensive heart disease with heart failure: Secondary | ICD-10-CM | POA: Diagnosis not present

## 2020-03-28 DIAGNOSIS — N319 Neuromuscular dysfunction of bladder, unspecified: Secondary | ICD-10-CM | POA: Diagnosis not present

## 2020-03-28 DIAGNOSIS — N390 Urinary tract infection, site not specified: Secondary | ICD-10-CM | POA: Diagnosis not present

## 2020-03-28 DIAGNOSIS — B952 Enterococcus as the cause of diseases classified elsewhere: Secondary | ICD-10-CM | POA: Diagnosis not present

## 2020-03-28 DIAGNOSIS — G9341 Metabolic encephalopathy: Secondary | ICD-10-CM | POA: Diagnosis not present

## 2020-03-28 DIAGNOSIS — Z466 Encounter for fitting and adjustment of urinary device: Secondary | ICD-10-CM | POA: Diagnosis not present

## 2020-03-29 DIAGNOSIS — B952 Enterococcus as the cause of diseases classified elsewhere: Secondary | ICD-10-CM | POA: Diagnosis not present

## 2020-03-29 DIAGNOSIS — Z466 Encounter for fitting and adjustment of urinary device: Secondary | ICD-10-CM | POA: Diagnosis not present

## 2020-03-29 DIAGNOSIS — N319 Neuromuscular dysfunction of bladder, unspecified: Secondary | ICD-10-CM | POA: Diagnosis not present

## 2020-03-29 DIAGNOSIS — I110 Hypertensive heart disease with heart failure: Secondary | ICD-10-CM | POA: Diagnosis not present

## 2020-03-29 DIAGNOSIS — N390 Urinary tract infection, site not specified: Secondary | ICD-10-CM | POA: Diagnosis not present

## 2020-03-29 DIAGNOSIS — G9341 Metabolic encephalopathy: Secondary | ICD-10-CM | POA: Diagnosis not present

## 2020-04-03 DIAGNOSIS — G4700 Insomnia, unspecified: Secondary | ICD-10-CM | POA: Diagnosis not present

## 2020-04-03 DIAGNOSIS — M7062 Trochanteric bursitis, left hip: Secondary | ICD-10-CM | POA: Diagnosis not present

## 2020-04-03 DIAGNOSIS — F411 Generalized anxiety disorder: Secondary | ICD-10-CM | POA: Diagnosis not present

## 2020-04-03 DIAGNOSIS — G8929 Other chronic pain: Secondary | ICD-10-CM | POA: Diagnosis not present

## 2020-04-03 DIAGNOSIS — M545 Low back pain: Secondary | ICD-10-CM | POA: Diagnosis not present

## 2020-04-03 DIAGNOSIS — M961 Postlaminectomy syndrome, not elsewhere classified: Secondary | ICD-10-CM | POA: Diagnosis not present

## 2020-04-03 DIAGNOSIS — M4716 Other spondylosis with myelopathy, lumbar region: Secondary | ICD-10-CM | POA: Diagnosis not present

## 2020-04-03 DIAGNOSIS — Z79899 Other long term (current) drug therapy: Secondary | ICD-10-CM | POA: Diagnosis not present

## 2020-04-03 DIAGNOSIS — M4316 Spondylolisthesis, lumbar region: Secondary | ICD-10-CM | POA: Diagnosis not present

## 2020-04-03 DIAGNOSIS — M48062 Spinal stenosis, lumbar region with neurogenic claudication: Secondary | ICD-10-CM | POA: Diagnosis not present

## 2020-04-10 ENCOUNTER — Ambulatory Visit (INDEPENDENT_AMBULATORY_CARE_PROVIDER_SITE_OTHER): Payer: Medicare Other | Admitting: Internal Medicine

## 2020-04-10 ENCOUNTER — Other Ambulatory Visit: Payer: Self-pay

## 2020-04-10 ENCOUNTER — Encounter: Payer: Self-pay | Admitting: Internal Medicine

## 2020-04-10 VITALS — BP 138/74 | HR 98 | Ht 61.0 in | Wt 130.6 lb

## 2020-04-10 DIAGNOSIS — I5022 Chronic systolic (congestive) heart failure: Secondary | ICD-10-CM

## 2020-04-10 DIAGNOSIS — Z950 Presence of cardiac pacemaker: Secondary | ICD-10-CM | POA: Diagnosis not present

## 2020-04-10 DIAGNOSIS — I255 Ischemic cardiomyopathy: Secondary | ICD-10-CM

## 2020-04-10 LAB — CUP PACEART INCLINIC DEVICE CHECK
Battery Remaining Longevity: 115 mo
Battery Voltage: 3.19 V
Brady Statistic AP VP Percent: 0.01 %
Brady Statistic AP VS Percent: 0.01 %
Brady Statistic AS VP Percent: 98.1 %
Brady Statistic AS VS Percent: 1.88 %
Brady Statistic RA Percent Paced: 0.02 %
Brady Statistic RV Percent Paced: 36.18 %
Date Time Interrogation Session: 20210728155900
Implantable Lead Implant Date: 20060214
Implantable Lead Implant Date: 20150406
Implantable Lead Implant Date: 20150406
Implantable Lead Location: 753858
Implantable Lead Location: 753859
Implantable Lead Location: 753860
Implantable Lead Model: 4396
Implantable Lead Model: 5076
Implantable Pulse Generator Implant Date: 20210610
Lead Channel Impedance Value: 1273 Ohm
Lead Channel Impedance Value: 1387 Ohm
Lead Channel Impedance Value: 342 Ohm
Lead Channel Impedance Value: 418 Ohm
Lead Channel Impedance Value: 494 Ohm
Lead Channel Impedance Value: 551 Ohm
Lead Channel Impedance Value: 646 Ohm
Lead Channel Impedance Value: 741 Ohm
Lead Channel Impedance Value: 855 Ohm
Lead Channel Pacing Threshold Amplitude: 0.75 V
Lead Channel Pacing Threshold Amplitude: 1 V
Lead Channel Pacing Threshold Amplitude: 1.75 V
Lead Channel Pacing Threshold Pulse Width: 0.4 ms
Lead Channel Pacing Threshold Pulse Width: 0.4 ms
Lead Channel Pacing Threshold Pulse Width: 0.6 ms
Lead Channel Sensing Intrinsic Amplitude: 2.8 mV
Lead Channel Sensing Intrinsic Amplitude: 8.1 mV
Lead Channel Setting Pacing Amplitude: 2 V
Lead Channel Setting Pacing Amplitude: 2.25 V
Lead Channel Setting Pacing Amplitude: 2.5 V
Lead Channel Setting Pacing Pulse Width: 0.4 ms
Lead Channel Setting Pacing Pulse Width: 0.6 ms
Lead Channel Setting Sensing Sensitivity: 0.9 mV

## 2020-04-10 NOTE — Patient Instructions (Addendum)
Medication Instructions:  Your physician recommends that you continue on your current medications as directed. Please refer to the Current Medication list given to you today.  *If you need a refill on your cardiac medications before your next appointment, please call your pharmacy*  Lab Work: None ordered.  If you have labs (blood work) drawn today and your tests are completely normal, you will receive your results only by: [name] MyChart Message (if you have MyChart) OR . A paper copy in the mail If you have any lab test that is abnormal or we need to change your treatment, we will call you to review the results.  Testing/Procedures: None ordered.  Follow-Up: At [facility], you and your health needs are our priority.  As part of our continuing mission to provide you with exceptional heart care, we have created designated Provider Care Teams.  These Care Teams include your primary Cardiologist (physician) and Advanced Practice Providers (APPs -  Physician Assistants and Nurse Practitioners) who all work together to provide you with the care you need, when you need it.  We recommend signing up for the patient portal called "MyChart".  Sign up information is provided on this After Visit Summary.  MyChart is used to connect with patients for Virtual Visits (Telemedicine).  Patients are able to view lab/test results, encounter notes, upcoming appointments, etc.  Non-urgent messages can be sent to your provider as well.   To learn more about what you can do with MyChart, go to [email or web address].    Your next appointment:   Your physician wants you to follow-up in: 1 year with Dr. Lovena Le in [place]. You will receive a reminder letter in the mail two months in advance. If you don't receive a letter, please call our office to schedule the follow-up appointment.  Remote monitoring is used to monitor your Pacemaker or ICD from home. This monitoring reduces the number of office visits required  to check your device to one time per year. It allows [name] to keep an eye on the functioning of your device to ensure it is working properly. You are scheduled for a device check from home on 05/24/2020. You may send your transmission at any time that day. If you have a wireless device, the transmission will be sent automatically. After your physician reviews your transmission, you will receive a postcard with your next transmission date.  Other Instructions:
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
## 2020-04-12 DIAGNOSIS — K219 Gastro-esophageal reflux disease without esophagitis: Secondary | ICD-10-CM | POA: Diagnosis not present

## 2020-04-12 DIAGNOSIS — R945 Abnormal results of liver function studies: Secondary | ICD-10-CM | POA: Diagnosis not present

## 2020-04-12 DIAGNOSIS — M545 Low back pain: Secondary | ICD-10-CM | POA: Diagnosis not present

## 2020-04-12 DIAGNOSIS — N1832 Chronic kidney disease, stage 3b: Secondary | ICD-10-CM | POA: Diagnosis not present

## 2020-04-12 DIAGNOSIS — I4891 Unspecified atrial fibrillation: Secondary | ICD-10-CM | POA: Diagnosis not present

## 2020-04-12 DIAGNOSIS — F411 Generalized anxiety disorder: Secondary | ICD-10-CM | POA: Diagnosis not present

## 2020-04-12 DIAGNOSIS — F5101 Primary insomnia: Secondary | ICD-10-CM | POA: Diagnosis not present

## 2020-04-12 DIAGNOSIS — E0822 Diabetes mellitus due to underlying condition with diabetic chronic kidney disease: Secondary | ICD-10-CM | POA: Diagnosis not present

## 2020-04-12 DIAGNOSIS — M79673 Pain in unspecified foot: Secondary | ICD-10-CM | POA: Diagnosis not present

## 2020-04-12 DIAGNOSIS — E782 Mixed hyperlipidemia: Secondary | ICD-10-CM | POA: Diagnosis not present

## 2020-04-18 ENCOUNTER — Telehealth: Payer: Self-pay | Admitting: *Deleted

## 2020-04-18 NOTE — Telephone Encounter (Signed)
[name] L. Porcaro 69 year old female would like to have REV L3-S1 LARNI / PSF TLIF instrumentation allegraft. She was last seen in the clinic on 04/10/2020.  She was doing well at that time.  May her aspirin be held for her upcoming procedure?  Her PMH includes anxiety, cardiomyopathy, chronic low back pain, depression, GERD, hypertension, hypercholesterolemia neurogenic bladder, type 2 diabetes.  Please direct response to CV DIV preop pool.  Thank you for your help.  [name]. Melanie Branum NP-C    04/18/2020, 2:13 PM [name] [address] Office [phone] Fax [phone]

## 2020-04-18 NOTE — Telephone Encounter (Signed)
   [facility] Pre-operative Risk Assessment    HEARTCARE STAFF: - Please ensure there is not already an duplicate clearance open for this procedure. - Under Visit Info/Reason for Call, type in Other and utilize the format Clearance MM/DD/YY or Clearance TBD. Do not use dashes or single digits. - If request is for dental extraction, please clarify the # of teeth to be extracted.  Request for surgical clearance:  1. What type of surgery is being performed?  REV L3-S1 LARNI / PSF TLIF INSTRUMENTATION ALLEGRAFT   2. When is this surgery scheduled?  TBD   3. What type of clearance is required (medical clearance vs. Pharmacy clearance to hold med vs. Both)?  BOTH  4. Are there any medications that need to be held prior to surgery and how long? ASPIRIN   5. Practice name and name of physician performing surgery?  [facility]    6. What is the office phone number?  [phone]   7.   What is the office fax number?  [phone]  8.   Anesthesia type (None, local, MAC, general) ?  GENERAL   [name] 04/18/2020, 1:39 PM  _________________________________________________________________   (provider comments below)

## 2020-04-19 DIAGNOSIS — E1129 Type 2 diabetes mellitus with other diabetic kidney complication: Secondary | ICD-10-CM | POA: Diagnosis not present

## 2020-04-19 DIAGNOSIS — E211 Secondary hyperparathyroidism, not elsewhere classified: Secondary | ICD-10-CM | POA: Diagnosis not present

## 2020-04-19 DIAGNOSIS — N189 Chronic kidney disease, unspecified: Secondary | ICD-10-CM | POA: Diagnosis not present

## 2020-04-19 DIAGNOSIS — E1122 Type 2 diabetes mellitus with diabetic chronic kidney disease: Secondary | ICD-10-CM | POA: Diagnosis not present

## 2020-04-19 DIAGNOSIS — D638 Anemia in other chronic diseases classified elsewhere: Secondary | ICD-10-CM | POA: Diagnosis not present

## 2020-04-19 DIAGNOSIS — I5042 Chronic combined systolic (congestive) and diastolic (congestive) heart failure: Secondary | ICD-10-CM | POA: Diagnosis not present

## 2020-04-19 DIAGNOSIS — N178 Other acute kidney failure: Secondary | ICD-10-CM | POA: Diagnosis not present

## 2020-04-19 DIAGNOSIS — R809 Proteinuria, unspecified: Secondary | ICD-10-CM | POA: Diagnosis not present

## 2020-04-19 NOTE — Telephone Encounter (Signed)
   Primary Cardiologist: Cristopher Peru, MD  Chart reviewed as part of pre-operative protocol coverage. Given past medical history and time since last visit, based on ACC/AHA guidelines, [name] would be at acceptable risk for the planned procedure without further cardiovascular testing.   Her aspirin may be held for 7 days prior to procedure.  Please resume as soon as hemostasis is achieved.  I will route this recommendation to the requesting party via Epic fax function and remove from pre-op pool.  Please call with questions.  Jossie Ng. Syd Manges NP-C    04/19/2020, 9:08 AM [place] [address] Office [phone] Fax [phone]

## 2020-04-19 NOTE — Telephone Encounter (Signed)
OK to hold aspirin- GT

## 2020-04-26 DIAGNOSIS — F411 Generalized anxiety disorder: Secondary | ICD-10-CM | POA: Diagnosis not present

## 2020-04-26 DIAGNOSIS — E119 Type 2 diabetes mellitus without complications: Secondary | ICD-10-CM | POA: Diagnosis not present

## 2020-04-26 DIAGNOSIS — F5101 Primary insomnia: Secondary | ICD-10-CM | POA: Diagnosis not present

## 2020-04-26 DIAGNOSIS — M79673 Pain in unspecified foot: Secondary | ICD-10-CM | POA: Diagnosis not present

## 2020-05-06 ENCOUNTER — Other Ambulatory Visit (HOSPITAL_COMMUNITY): Payer: Self-pay | Admitting: Obstetrics and Gynecology

## 2020-05-06 DIAGNOSIS — Z1231 Encounter for screening mammogram for malignant neoplasm of breast: Secondary | ICD-10-CM

## 2020-05-06 DIAGNOSIS — E785 Hyperlipidemia, unspecified: Secondary | ICD-10-CM | POA: Diagnosis not present

## 2020-05-06 DIAGNOSIS — E109 Type 1 diabetes mellitus without complications: Secondary | ICD-10-CM | POA: Diagnosis not present

## 2020-05-07 DIAGNOSIS — M48062 Spinal stenosis, lumbar region with neurogenic claudication: Secondary | ICD-10-CM | POA: Diagnosis not present

## 2020-05-07 DIAGNOSIS — M961 Postlaminectomy syndrome, not elsewhere classified: Secondary | ICD-10-CM | POA: Diagnosis not present

## 2020-05-07 DIAGNOSIS — Z79899 Other long term (current) drug therapy: Secondary | ICD-10-CM | POA: Diagnosis not present

## 2020-05-09 ENCOUNTER — Ambulatory Visit (HOSPITAL_COMMUNITY): Payer: Medicare Other

## 2020-05-10 DIAGNOSIS — I5042 Chronic combined systolic (congestive) and diastolic (congestive) heart failure: Secondary | ICD-10-CM | POA: Diagnosis not present

## 2020-05-10 DIAGNOSIS — E1129 Type 2 diabetes mellitus with other diabetic kidney complication: Secondary | ICD-10-CM | POA: Diagnosis not present

## 2020-05-10 DIAGNOSIS — E1122 Type 2 diabetes mellitus with diabetic chronic kidney disease: Secondary | ICD-10-CM | POA: Diagnosis not present

## 2020-05-10 DIAGNOSIS — E211 Secondary hyperparathyroidism, not elsewhere classified: Secondary | ICD-10-CM | POA: Diagnosis not present

## 2020-05-10 DIAGNOSIS — R809 Proteinuria, unspecified: Secondary | ICD-10-CM | POA: Diagnosis not present

## 2020-05-10 DIAGNOSIS — N189 Chronic kidney disease, unspecified: Secondary | ICD-10-CM | POA: Diagnosis not present

## 2020-05-13 ENCOUNTER — Other Ambulatory Visit: Payer: Self-pay | Admitting: Cardiology

## 2020-05-13 DIAGNOSIS — E785 Hyperlipidemia, unspecified: Secondary | ICD-10-CM | POA: Diagnosis not present

## 2020-05-13 DIAGNOSIS — E139 Other specified diabetes mellitus without complications: Secondary | ICD-10-CM | POA: Diagnosis not present

## 2020-05-13 DIAGNOSIS — Z6823 Body mass index (BMI) 23.0-23.9, adult: Secondary | ICD-10-CM | POA: Diagnosis not present

## 2020-05-13 DIAGNOSIS — E0821 Diabetes mellitus due to underlying condition with diabetic nephropathy: Secondary | ICD-10-CM | POA: Diagnosis not present

## 2020-05-13 DIAGNOSIS — E109 Type 1 diabetes mellitus without complications: Secondary | ICD-10-CM | POA: Diagnosis not present

## 2020-05-13 DIAGNOSIS — R945 Abnormal results of liver function studies: Secondary | ICD-10-CM | POA: Diagnosis not present

## 2020-05-13 DIAGNOSIS — E1140 Type 2 diabetes mellitus with diabetic neuropathy, unspecified: Secondary | ICD-10-CM | POA: Diagnosis not present

## 2020-05-13 DIAGNOSIS — N184 Chronic kidney disease, stage 4 (severe): Secondary | ICD-10-CM | POA: Diagnosis not present

## 2020-05-13 DIAGNOSIS — I429 Cardiomyopathy, unspecified: Secondary | ICD-10-CM | POA: Diagnosis not present

## 2020-05-14 NOTE — Telephone Encounter (Signed)
This is a [place] pt.  °

## 2020-05-15 ENCOUNTER — Ambulatory Visit (HOSPITAL_COMMUNITY): Payer: Medicare Other

## 2020-05-17 DIAGNOSIS — R799 Abnormal finding of blood chemistry, unspecified: Secondary | ICD-10-CM | POA: Diagnosis not present

## 2020-05-17 DIAGNOSIS — Z01812 Encounter for preprocedural laboratory examination: Secondary | ICD-10-CM | POA: Diagnosis not present

## 2020-05-17 DIAGNOSIS — M48062 Spinal stenosis, lumbar region with neurogenic claudication: Secondary | ICD-10-CM | POA: Diagnosis not present

## 2020-05-17 DIAGNOSIS — M961 Postlaminectomy syndrome, not elsewhere classified: Secondary | ICD-10-CM | POA: Diagnosis not present

## 2020-05-17 DIAGNOSIS — Z4689 Encounter for fitting and adjustment of other specified devices: Secondary | ICD-10-CM | POA: Diagnosis not present

## 2020-05-17 DIAGNOSIS — M4316 Spondylolisthesis, lumbar region: Secondary | ICD-10-CM | POA: Diagnosis not present

## 2020-05-21 ENCOUNTER — Other Ambulatory Visit: Payer: Self-pay | Admitting: Family Medicine

## 2020-05-21 DIAGNOSIS — E0822 Diabetes mellitus due to underlying condition with diabetic chronic kidney disease: Secondary | ICD-10-CM | POA: Diagnosis not present

## 2020-05-21 DIAGNOSIS — N183 Chronic kidney disease, stage 3 unspecified: Secondary | ICD-10-CM | POA: Diagnosis not present

## 2020-05-21 DIAGNOSIS — N390 Urinary tract infection, site not specified: Secondary | ICD-10-CM | POA: Diagnosis not present

## 2020-05-21 DIAGNOSIS — I5043 Acute on chronic combined systolic (congestive) and diastolic (congestive) heart failure: Secondary | ICD-10-CM | POA: Diagnosis not present

## 2020-05-21 DIAGNOSIS — F5101 Primary insomnia: Secondary | ICD-10-CM | POA: Diagnosis not present

## 2020-05-21 DIAGNOSIS — N319 Neuromuscular dysfunction of bladder, unspecified: Secondary | ICD-10-CM | POA: Diagnosis not present

## 2020-05-21 DIAGNOSIS — E782 Mixed hyperlipidemia: Secondary | ICD-10-CM | POA: Diagnosis not present

## 2020-05-21 DIAGNOSIS — E1122 Type 2 diabetes mellitus with diabetic chronic kidney disease: Secondary | ICD-10-CM | POA: Diagnosis not present

## 2020-05-21 DIAGNOSIS — G9341 Metabolic encephalopathy: Secondary | ICD-10-CM | POA: Diagnosis not present

## 2020-05-21 DIAGNOSIS — G8929 Other chronic pain: Secondary | ICD-10-CM | POA: Diagnosis not present

## 2020-05-21 DIAGNOSIS — B952 Enterococcus as the cause of diseases classified elsewhere: Secondary | ICD-10-CM | POA: Diagnosis not present

## 2020-05-21 DIAGNOSIS — I110 Hypertensive heart disease with heart failure: Secondary | ICD-10-CM | POA: Diagnosis not present

## 2020-05-23 DIAGNOSIS — I5043 Acute on chronic combined systolic (congestive) and diastolic (congestive) heart failure: Secondary | ICD-10-CM | POA: Diagnosis not present

## 2020-05-23 DIAGNOSIS — N390 Urinary tract infection, site not specified: Secondary | ICD-10-CM | POA: Diagnosis not present

## 2020-05-23 DIAGNOSIS — E1122 Type 2 diabetes mellitus with diabetic chronic kidney disease: Secondary | ICD-10-CM | POA: Diagnosis not present

## 2020-05-23 DIAGNOSIS — G9341 Metabolic encephalopathy: Secondary | ICD-10-CM | POA: Diagnosis not present

## 2020-05-23 DIAGNOSIS — M79673 Pain in unspecified foot: Secondary | ICD-10-CM | POA: Diagnosis not present

## 2020-05-23 DIAGNOSIS — N183 Chronic kidney disease, stage 3 unspecified: Secondary | ICD-10-CM | POA: Diagnosis not present

## 2020-05-23 DIAGNOSIS — M545 Low back pain: Secondary | ICD-10-CM | POA: Diagnosis not present

## 2020-05-23 DIAGNOSIS — K219 Gastro-esophageal reflux disease without esophagitis: Secondary | ICD-10-CM | POA: Diagnosis not present

## 2020-05-23 DIAGNOSIS — E0822 Diabetes mellitus due to underlying condition with diabetic chronic kidney disease: Secondary | ICD-10-CM | POA: Diagnosis not present

## 2020-05-23 DIAGNOSIS — E782 Mixed hyperlipidemia: Secondary | ICD-10-CM | POA: Diagnosis not present

## 2020-05-23 DIAGNOSIS — M961 Postlaminectomy syndrome, not elsewhere classified: Secondary | ICD-10-CM | POA: Diagnosis not present

## 2020-05-23 DIAGNOSIS — I4891 Unspecified atrial fibrillation: Secondary | ICD-10-CM | POA: Diagnosis not present

## 2020-05-23 DIAGNOSIS — R945 Abnormal results of liver function studies: Secondary | ICD-10-CM | POA: Diagnosis not present

## 2020-05-23 DIAGNOSIS — F411 Generalized anxiety disorder: Secondary | ICD-10-CM | POA: Diagnosis not present

## 2020-05-23 DIAGNOSIS — I110 Hypertensive heart disease with heart failure: Secondary | ICD-10-CM | POA: Diagnosis not present

## 2020-05-23 DIAGNOSIS — N1832 Chronic kidney disease, stage 3b: Secondary | ICD-10-CM | POA: Diagnosis not present

## 2020-05-23 DIAGNOSIS — F5101 Primary insomnia: Secondary | ICD-10-CM | POA: Diagnosis not present

## 2020-05-23 DIAGNOSIS — B952 Enterococcus as the cause of diseases classified elsewhere: Secondary | ICD-10-CM | POA: Diagnosis not present

## 2020-05-23 DIAGNOSIS — G8929 Other chronic pain: Secondary | ICD-10-CM | POA: Diagnosis not present

## 2020-05-23 DIAGNOSIS — N319 Neuromuscular dysfunction of bladder, unspecified: Secondary | ICD-10-CM | POA: Diagnosis not present

## 2020-05-24 ENCOUNTER — Encounter (INDEPENDENT_AMBULATORY_CARE_PROVIDER_SITE_OTHER): Payer: Medicare Other | Admitting: *Deleted

## 2020-05-24 DIAGNOSIS — I255 Ischemic cardiomyopathy: Secondary | ICD-10-CM

## 2020-05-24 LAB — CUP PACEART REMOTE DEVICE CHECK
Battery Remaining Longevity: 109 mo
Battery Voltage: 3.17 V
Brady Statistic AP VP Percent: 0.05 %
Brady Statistic AP VS Percent: 0.01 %
Brady Statistic AS VP Percent: 98.52 %
Brady Statistic AS VS Percent: 1.42 %
Brady Statistic RA Percent Paced: 0.07 %
Brady Statistic RV Percent Paced: 23.52 %
Date Time Interrogation Session: 20210910014803
Implantable Lead Implant Date: 20060214
Implantable Lead Implant Date: 20150406
Implantable Lead Implant Date: 20150406
Implantable Lead Location: 753858
Implantable Lead Location: 753859
Implantable Lead Location: 753860
Implantable Lead Model: 4396
Implantable Lead Model: 5076
Implantable Pulse Generator Implant Date: 20210610
Lead Channel Impedance Value: 1254 Ohm
Lead Channel Impedance Value: 1368 Ohm
Lead Channel Impedance Value: 342 Ohm
Lead Channel Impedance Value: 380 Ohm
Lead Channel Impedance Value: 475 Ohm
Lead Channel Impedance Value: 513 Ohm
Lead Channel Impedance Value: 608 Ohm
Lead Channel Impedance Value: 741 Ohm
Lead Channel Impedance Value: 779 Ohm
Lead Channel Pacing Threshold Amplitude: 1 V
Lead Channel Pacing Threshold Amplitude: 1.125 V
Lead Channel Pacing Threshold Amplitude: 1.875 V
Lead Channel Pacing Threshold Pulse Width: 0.4 ms
Lead Channel Pacing Threshold Pulse Width: 0.4 ms
Lead Channel Pacing Threshold Pulse Width: 0.6 ms
Lead Channel Sensing Intrinsic Amplitude: 16.875 mV
Lead Channel Sensing Intrinsic Amplitude: 16.875 mV
Lead Channel Sensing Intrinsic Amplitude: 2.5 mV
Lead Channel Sensing Intrinsic Amplitude: 2.5 mV
Lead Channel Setting Pacing Amplitude: 2 V
Lead Channel Setting Pacing Amplitude: 2.25 V
Lead Channel Setting Pacing Amplitude: 2.5 V
Lead Channel Setting Pacing Pulse Width: 0.4 ms
Lead Channel Setting Pacing Pulse Width: 0.6 ms
Lead Channel Setting Sensing Sensitivity: 0.9 mV

## 2020-05-27 DIAGNOSIS — Z79899 Other long term (current) drug therapy: Secondary | ICD-10-CM | POA: Diagnosis not present

## 2020-05-27 DIAGNOSIS — M961 Postlaminectomy syndrome, not elsewhere classified: Secondary | ICD-10-CM | POA: Diagnosis not present

## 2020-05-27 DIAGNOSIS — M48062 Spinal stenosis, lumbar region with neurogenic claudication: Secondary | ICD-10-CM | POA: Diagnosis not present

## 2020-05-27 NOTE — Progress Notes (Signed)
Remote pacemaker transmission.   

## 2020-05-28 ENCOUNTER — Other Ambulatory Visit: Payer: Self-pay

## 2020-05-28 ENCOUNTER — Ambulatory Visit (INDEPENDENT_AMBULATORY_CARE_PROVIDER_SITE_OTHER): Payer: Medicare Other | Admitting: Internal Medicine

## 2020-05-28 ENCOUNTER — Encounter: Payer: Self-pay | Admitting: Internal Medicine

## 2020-05-28 VITALS — BP 130/62 | HR 83 | Ht 61.0 in | Wt 138.8 lb

## 2020-05-28 DIAGNOSIS — Z23 Encounter for immunization: Secondary | ICD-10-CM

## 2020-05-28 DIAGNOSIS — I255 Ischemic cardiomyopathy: Secondary | ICD-10-CM
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
## 2020-05-28 NOTE — Patient Instructions (Signed)
Medication Instructions:  Your physician recommends that you continue on your current medications as directed. Please refer to the Current Medication list given to you today.  *If you need a refill on your cardiac medications before your next appointment, please call your pharmacy*   Lab Work: NONE   If you have labs (blood work) drawn today and your tests are completely normal, you will receive your results only by: . MyChart Message (if you have MyChart) OR . A paper copy in the mail If you have any lab test that is abnormal or we need to change your treatment, we will call you to review the results.   Testing/Procedures: NONE    Follow-Up: At [facility], you and your health needs are our priority.  As part of our continuing mission to provide you with exceptional heart care, we have created designated Provider Care Teams.  These Care Teams include your primary Cardiologist (physician) and Advanced Practice Providers (APPs -  Physician Assistants and Nurse Practitioners) who all work together to provide you with the care you need, when you need it.  We recommend signing up for the patient portal called "MyChart".  Sign up information is provided on this After Visit Summary.  MyChart is used to connect with patients for Virtual Visits (Telemedicine).  Patients are able to view lab/test results, encounter notes, upcoming appointments, etc.  Non-urgent messages can be sent to your provider as well.   To learn more about what you can do with MyChart, go to [email or web address].    Your next appointment:   1 year(s)  The format for your next appointment:   In Person  Provider:   Gregg Taylor, MD   Other Instructions Thank you for choosing [facility]!    

## 2020-06-04 DIAGNOSIS — Z833 Family history of diabetes mellitus: Secondary | ICD-10-CM | POA: Diagnosis not present

## 2020-06-04 DIAGNOSIS — M4716 Other spondylosis with myelopathy, lumbar region: Secondary | ICD-10-CM | POA: Diagnosis not present

## 2020-06-04 DIAGNOSIS — R4182 Altered mental status, unspecified: Secondary | ICD-10-CM | POA: Diagnosis not present

## 2020-06-04 DIAGNOSIS — M545 Low back pain: Secondary | ICD-10-CM | POA: Diagnosis not present

## 2020-06-04 DIAGNOSIS — I959 Hypotension, unspecified: Secondary | ICD-10-CM | POA: Diagnosis not present

## 2020-06-04 DIAGNOSIS — D6959 Other secondary thrombocytopenia: Secondary | ICD-10-CM | POA: Diagnosis present

## 2020-06-04 DIAGNOSIS — D62 Acute posthemorrhagic anemia: Secondary | ICD-10-CM | POA: Diagnosis not present

## 2020-06-04 DIAGNOSIS — I5042 Chronic combined systolic (congestive) and diastolic (congestive) heart failure: Secondary | ICD-10-CM | POA: Diagnosis present

## 2020-06-04 DIAGNOSIS — M4317 Spondylolisthesis, lumbosacral region: Secondary | ICD-10-CM | POA: Diagnosis present

## 2020-06-04 DIAGNOSIS — E1122 Type 2 diabetes mellitus with diabetic chronic kidney disease: Secondary | ICD-10-CM | POA: Diagnosis present

## 2020-06-04 DIAGNOSIS — Z981 Arthrodesis status: Secondary | ICD-10-CM | POA: Diagnosis not present

## 2020-06-04 DIAGNOSIS — M5416 Radiculopathy, lumbar region: Secondary | ICD-10-CM | POA: Diagnosis present

## 2020-06-04 DIAGNOSIS — Z95810 Presence of automatic (implantable) cardiac defibrillator: Secondary | ICD-10-CM | POA: Diagnosis not present

## 2020-06-04 DIAGNOSIS — I130 Hypertensive heart and chronic kidney disease with heart failure and stage 1 through stage 4 chronic kidney disease, or unspecified chronic kidney disease: Secondary | ICD-10-CM | POA: Diagnosis present

## 2020-06-04 DIAGNOSIS — I255 Ischemic cardiomyopathy: Secondary | ICD-10-CM | POA: Diagnosis present

## 2020-06-04 DIAGNOSIS — Z20822 Contact with and (suspected) exposure to covid-19: Secondary | ICD-10-CM | POA: Diagnosis not present

## 2020-06-04 DIAGNOSIS — E782 Mixed hyperlipidemia: Secondary | ICD-10-CM | POA: Diagnosis present

## 2020-06-04 DIAGNOSIS — D631 Anemia in chronic kidney disease: Secondary | ICD-10-CM | POA: Diagnosis present

## 2020-06-04 DIAGNOSIS — M961 Postlaminectomy syndrome, not elsewhere classified: Secondary | ICD-10-CM | POA: Diagnosis present

## 2020-06-04 DIAGNOSIS — F10139 Alcohol abuse with withdrawal, unspecified: Secondary | ICD-10-CM | POA: Diagnosis not present

## 2020-06-04 DIAGNOSIS — R451 Restlessness and agitation: Secondary | ICD-10-CM | POA: Diagnosis not present

## 2020-06-04 DIAGNOSIS — F419 Anxiety disorder, unspecified: Secondary | ICD-10-CM | POA: Diagnosis present

## 2020-06-04 DIAGNOSIS — N183 Chronic kidney disease, stage 3 unspecified: Secondary | ICD-10-CM | POA: Diagnosis present

## 2020-06-04 DIAGNOSIS — I468 Cardiac arrest due to other underlying condition: Secondary | ICD-10-CM | POA: Diagnosis not present

## 2020-06-04 DIAGNOSIS — M48062 Spinal stenosis, lumbar region with neurogenic claudication: Secondary | ICD-10-CM | POA: Diagnosis present

## 2020-06-04 DIAGNOSIS — M4316 Spondylolisthesis, lumbar region: Secondary | ICD-10-CM | POA: Diagnosis present

## 2020-06-04 DIAGNOSIS — R200 Anesthesia of skin: Secondary | ICD-10-CM | POA: Diagnosis present

## 2020-06-04 DIAGNOSIS — R509 Fever, unspecified: Secondary | ICD-10-CM | POA: Diagnosis not present

## 2020-06-04 DIAGNOSIS — F329 Major depressive disorder, single episode, unspecified: Secondary | ICD-10-CM | POA: Diagnosis present

## 2020-06-04 DIAGNOSIS — I426 Alcoholic cardiomyopathy: Secondary | ICD-10-CM | POA: Diagnosis present

## 2020-06-04 DIAGNOSIS — R7989 Other specified abnormal findings of blood chemistry: Secondary | ICD-10-CM | POA: Diagnosis not present

## 2020-06-05 DIAGNOSIS — Z95810 Presence of automatic (implantable) cardiac defibrillator: Secondary | ICD-10-CM | POA: Diagnosis not present

## 2020-06-05 DIAGNOSIS — I5042 Chronic combined systolic (congestive) and diastolic (congestive) heart failure: Secondary | ICD-10-CM | POA: Diagnosis not present

## 2020-06-05 DIAGNOSIS — R509 Fever, unspecified: Secondary | ICD-10-CM | POA: Diagnosis not present

## 2020-06-06 DIAGNOSIS — M5416 Radiculopathy, lumbar region: Secondary | ICD-10-CM | POA: Diagnosis present

## 2020-06-06 DIAGNOSIS — F329 Major depressive disorder, single episode, unspecified: Secondary | ICD-10-CM | POA: Diagnosis present

## 2020-06-06 DIAGNOSIS — D631 Anemia in chronic kidney disease: Secondary | ICD-10-CM | POA: Diagnosis present

## 2020-06-06 DIAGNOSIS — I5042 Chronic combined systolic (congestive) and diastolic (congestive) heart failure: Secondary | ICD-10-CM | POA: Diagnosis present

## 2020-06-06 DIAGNOSIS — M48062 Spinal stenosis, lumbar region with neurogenic claudication: Secondary | ICD-10-CM | POA: Diagnosis present

## 2020-06-06 DIAGNOSIS — Z833 Family history of diabetes mellitus: Secondary | ICD-10-CM | POA: Diagnosis not present

## 2020-06-06 DIAGNOSIS — M4316 Spondylolisthesis, lumbar region: Secondary | ICD-10-CM | POA: Diagnosis present

## 2020-06-06 DIAGNOSIS — M961 Postlaminectomy syndrome, not elsewhere classified: Secondary | ICD-10-CM | POA: Diagnosis present

## 2020-06-06 DIAGNOSIS — E782 Mixed hyperlipidemia: Secondary | ICD-10-CM | POA: Diagnosis present

## 2020-06-06 DIAGNOSIS — D62 Acute posthemorrhagic anemia: Secondary | ICD-10-CM | POA: Diagnosis not present

## 2020-06-06 DIAGNOSIS — I130 Hypertensive heart and chronic kidney disease with heart failure and stage 1 through stage 4 chronic kidney disease, or unspecified chronic kidney disease: Secondary | ICD-10-CM | POA: Diagnosis present

## 2020-06-06 DIAGNOSIS — R7989 Other specified abnormal findings of blood chemistry: Secondary | ICD-10-CM | POA: Diagnosis not present

## 2020-06-06 DIAGNOSIS — I426 Alcoholic cardiomyopathy: Secondary | ICD-10-CM | POA: Diagnosis present

## 2020-06-06 DIAGNOSIS — F10139 Alcohol abuse with withdrawal, unspecified: Secondary | ICD-10-CM | POA: Diagnosis not present

## 2020-06-06 DIAGNOSIS — R4182 Altered mental status, unspecified: Secondary | ICD-10-CM | POA: Diagnosis not present

## 2020-06-06 DIAGNOSIS — F419 Anxiety disorder, unspecified: Secondary | ICD-10-CM | POA: Diagnosis present

## 2020-06-06 DIAGNOSIS — R451 Restlessness and agitation: Secondary | ICD-10-CM | POA: Diagnosis not present

## 2020-06-06 DIAGNOSIS — Z20822 Contact with and (suspected) exposure to covid-19: Secondary | ICD-10-CM | POA: Diagnosis not present

## 2020-06-06 DIAGNOSIS — I255 Ischemic cardiomyopathy: Secondary | ICD-10-CM | POA: Diagnosis present

## 2020-06-06 DIAGNOSIS — M4317 Spondylolisthesis, lumbosacral region: Secondary | ICD-10-CM | POA: Diagnosis present

## 2020-06-06 DIAGNOSIS — N183 Chronic kidney disease, stage 3 unspecified: Secondary | ICD-10-CM | POA: Diagnosis present

## 2020-06-06 DIAGNOSIS — I468 Cardiac arrest due to other underlying condition: Secondary | ICD-10-CM | POA: Diagnosis not present

## 2020-06-06 DIAGNOSIS — R200 Anesthesia of skin: Secondary | ICD-10-CM | POA: Diagnosis present

## 2020-06-06 DIAGNOSIS — E1122 Type 2 diabetes mellitus with diabetic chronic kidney disease: Secondary | ICD-10-CM | POA: Diagnosis present

## 2020-06-06 DIAGNOSIS — I959 Hypotension, unspecified: Secondary | ICD-10-CM | POA: Diagnosis not present

## 2020-06-06 DIAGNOSIS — D6959 Other secondary thrombocytopenia: Secondary | ICD-10-CM | POA: Diagnosis present

## 2020-06-09 DIAGNOSIS — F10939 Alcohol use, unspecified with withdrawal, unspecified: Secondary | ICD-10-CM | POA: Insufficient documentation

## 2020-06-09 DIAGNOSIS — F10931 Alcohol use, unspecified with withdrawal delirium: Secondary | ICD-10-CM | POA: Insufficient documentation

## 2020-06-09 DIAGNOSIS — F1010 Alcohol abuse, uncomplicated: Secondary | ICD-10-CM | POA: Insufficient documentation

## 2020-06-09 DIAGNOSIS — F10231 Alcohol dependence with withdrawal delirium: Secondary | ICD-10-CM | POA: Insufficient documentation

## 2020-06-09 DIAGNOSIS — R000 Tachycardia, unspecified: Secondary | ICD-10-CM | POA: Insufficient documentation

## 2020-06-09 DIAGNOSIS — A419 Sepsis, unspecified organism: Secondary | ICD-10-CM | POA: Insufficient documentation

## 2020-06-09 DIAGNOSIS — M47816 Spondylosis without myelopathy or radiculopathy, lumbar region: Secondary | ICD-10-CM | POA: Insufficient documentation

## 2020-06-09 DIAGNOSIS — M4317 Spondylolisthesis, lumbosacral region: Secondary | ICD-10-CM | POA: Insufficient documentation

## 2020-06-09 HISTORY — DX: Alcohol abuse, uncomplicated: F10.10

## 2020-06-10 DIAGNOSIS — Z789 Other specified health status: Secondary | ICD-10-CM | POA: Insufficient documentation

## 2020-06-21 DIAGNOSIS — E782 Mixed hyperlipidemia: Secondary | ICD-10-CM | POA: Diagnosis not present

## 2020-06-21 DIAGNOSIS — Z981 Arthrodesis status: Secondary | ICD-10-CM | POA: Diagnosis not present

## 2020-06-21 DIAGNOSIS — D631 Anemia in chronic kidney disease: Secondary | ICD-10-CM | POA: Diagnosis not present

## 2020-06-21 DIAGNOSIS — Z4789 Encounter for other orthopedic aftercare: Secondary | ICD-10-CM | POA: Diagnosis not present

## 2020-06-21 DIAGNOSIS — Z8674 Personal history of sudden cardiac arrest: Secondary | ICD-10-CM | POA: Diagnosis not present

## 2020-06-21 DIAGNOSIS — Z794 Long term (current) use of insulin: Secondary | ICD-10-CM | POA: Diagnosis not present

## 2020-06-21 DIAGNOSIS — M4316 Spondylolisthesis, lumbar region: Secondary | ICD-10-CM | POA: Diagnosis not present

## 2020-06-21 DIAGNOSIS — M4317 Spondylolisthesis, lumbosacral region: Secondary | ICD-10-CM | POA: Diagnosis not present

## 2020-06-21 DIAGNOSIS — M545 Low back pain, unspecified: Secondary | ICD-10-CM | POA: Diagnosis not present

## 2020-06-21 DIAGNOSIS — Z950 Presence of cardiac pacemaker: Secondary | ICD-10-CM | POA: Diagnosis not present

## 2020-06-21 DIAGNOSIS — I130 Hypertensive heart and chronic kidney disease with heart failure and stage 1 through stage 4 chronic kidney disease, or unspecified chronic kidney disease: Secondary | ICD-10-CM | POA: Diagnosis not present

## 2020-06-21 DIAGNOSIS — I426 Alcoholic cardiomyopathy: Secondary | ICD-10-CM | POA: Diagnosis not present

## 2020-06-21 DIAGNOSIS — K219 Gastro-esophageal reflux disease without esophagitis: Secondary | ICD-10-CM | POA: Diagnosis not present

## 2020-06-21 DIAGNOSIS — E1122 Type 2 diabetes mellitus with diabetic chronic kidney disease: Secondary | ICD-10-CM | POA: Diagnosis not present

## 2020-06-21 DIAGNOSIS — I5042 Chronic combined systolic (congestive) and diastolic (congestive) heart failure: Secondary | ICD-10-CM | POA: Diagnosis not present

## 2020-06-21 DIAGNOSIS — G8929 Other chronic pain: Secondary | ICD-10-CM | POA: Diagnosis not present

## 2020-06-21 DIAGNOSIS — F10131 Alcohol abuse with withdrawal delirium: Secondary | ICD-10-CM | POA: Diagnosis not present

## 2020-06-21 DIAGNOSIS — N183 Chronic kidney disease, stage 3 unspecified: Secondary | ICD-10-CM | POA: Diagnosis not present

## 2020-06-24 DIAGNOSIS — E1140 Type 2 diabetes mellitus with diabetic neuropathy, unspecified: Secondary | ICD-10-CM | POA: Diagnosis not present

## 2020-06-24 DIAGNOSIS — Z6823 Body mass index (BMI) 23.0-23.9, adult: Secondary | ICD-10-CM | POA: Diagnosis not present

## 2020-06-24 DIAGNOSIS — E0821 Diabetes mellitus due to underlying condition with diabetic nephropathy: Secondary | ICD-10-CM | POA: Diagnosis not present

## 2020-06-24 DIAGNOSIS — R945 Abnormal results of liver function studies: Secondary | ICD-10-CM | POA: Diagnosis not present

## 2020-06-24 DIAGNOSIS — E785 Hyperlipidemia, unspecified: Secondary | ICD-10-CM | POA: Diagnosis not present

## 2020-06-24 DIAGNOSIS — E109 Type 1 diabetes mellitus without complications: Secondary | ICD-10-CM | POA: Diagnosis not present

## 2020-06-24 DIAGNOSIS — N184 Chronic kidney disease, stage 4 (severe): Secondary | ICD-10-CM | POA: Diagnosis not present

## 2020-06-24 DIAGNOSIS — E139 Other specified diabetes mellitus without complications: Secondary | ICD-10-CM | POA: Diagnosis not present

## 2020-06-24 DIAGNOSIS — I429 Cardiomyopathy, unspecified: Secondary | ICD-10-CM | POA: Diagnosis not present

## 2020-06-25 DIAGNOSIS — M4317 Spondylolisthesis, lumbosacral region: Secondary | ICD-10-CM | POA: Diagnosis not present

## 2020-06-25 DIAGNOSIS — M4316 Spondylolisthesis, lumbar region: Secondary | ICD-10-CM | POA: Diagnosis not present

## 2020-06-25 DIAGNOSIS — Z4789 Encounter for other orthopedic aftercare: Secondary | ICD-10-CM | POA: Diagnosis not present

## 2020-06-25 DIAGNOSIS — I5042 Chronic combined systolic (congestive) and diastolic (congestive) heart failure: Secondary | ICD-10-CM | POA: Diagnosis not present

## 2020-06-25 DIAGNOSIS — I426 Alcoholic cardiomyopathy: Secondary | ICD-10-CM | POA: Diagnosis not present

## 2020-06-25 DIAGNOSIS — E1122 Type 2 diabetes mellitus with diabetic chronic kidney disease: Secondary | ICD-10-CM | POA: Diagnosis not present

## 2020-06-25 DIAGNOSIS — I130 Hypertensive heart and chronic kidney disease with heart failure and stage 1 through stage 4 chronic kidney disease, or unspecified chronic kidney disease: Secondary | ICD-10-CM | POA: Diagnosis not present

## 2020-06-27 DIAGNOSIS — N390 Urinary tract infection, site not specified: Secondary | ICD-10-CM | POA: Diagnosis not present

## 2020-06-27 DIAGNOSIS — I4891 Unspecified atrial fibrillation: Secondary | ICD-10-CM | POA: Diagnosis not present

## 2020-06-27 DIAGNOSIS — K219 Gastro-esophageal reflux disease without esophagitis: Secondary | ICD-10-CM | POA: Diagnosis not present

## 2020-06-27 DIAGNOSIS — E0822 Diabetes mellitus due to underlying condition with diabetic chronic kidney disease: Secondary | ICD-10-CM | POA: Diagnosis not present

## 2020-06-27 DIAGNOSIS — M79673 Pain in unspecified foot: Secondary | ICD-10-CM | POA: Diagnosis not present

## 2020-06-27 DIAGNOSIS — E782 Mixed hyperlipidemia: Secondary | ICD-10-CM | POA: Diagnosis not present

## 2020-06-27 DIAGNOSIS — F411 Generalized anxiety disorder: Secondary | ICD-10-CM | POA: Diagnosis not present

## 2020-06-27 DIAGNOSIS — R945 Abnormal results of liver function studies: Secondary | ICD-10-CM | POA: Diagnosis not present

## 2020-06-27 DIAGNOSIS — N1832 Chronic kidney disease, stage 3b: Secondary | ICD-10-CM | POA: Diagnosis not present

## 2020-06-27 DIAGNOSIS — F5101 Primary insomnia: Secondary | ICD-10-CM | POA: Diagnosis not present

## 2020-06-28 ENCOUNTER — Encounter: Payer: Medicare Other | Admitting: Nurse Practitioner
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
## 2020-06-29 DIAGNOSIS — M4317 Spondylolisthesis, lumbosacral region: Secondary | ICD-10-CM | POA: Diagnosis not present

## 2020-06-29 DIAGNOSIS — Z4789 Encounter for other orthopedic aftercare: Secondary | ICD-10-CM | POA: Diagnosis not present

## 2020-06-29 DIAGNOSIS — E1122 Type 2 diabetes mellitus with diabetic chronic kidney disease: Secondary | ICD-10-CM | POA: Diagnosis not present

## 2020-06-29 DIAGNOSIS — I130 Hypertensive heart and chronic kidney disease with heart failure and stage 1 through stage 4 chronic kidney disease, or unspecified chronic kidney disease: Secondary | ICD-10-CM | POA: Diagnosis not present

## 2020-06-29 DIAGNOSIS — I426 Alcoholic cardiomyopathy: Secondary | ICD-10-CM | POA: Diagnosis not present

## 2020-06-29 DIAGNOSIS — I5042 Chronic combined systolic (congestive) and diastolic (congestive) heart failure: Secondary | ICD-10-CM | POA: Diagnosis not present

## 2020-07-01 DIAGNOSIS — Z4789 Encounter for other orthopedic aftercare: Secondary | ICD-10-CM | POA: Diagnosis not present

## 2020-07-01 DIAGNOSIS — I130 Hypertensive heart and chronic kidney disease with heart failure and stage 1 through stage 4 chronic kidney disease, or unspecified chronic kidney disease: Secondary | ICD-10-CM | POA: Diagnosis not present

## 2020-07-01 DIAGNOSIS — I426 Alcoholic cardiomyopathy: Secondary | ICD-10-CM | POA: Diagnosis not present

## 2020-07-01 DIAGNOSIS — M4317 Spondylolisthesis, lumbosacral region: Secondary | ICD-10-CM | POA: Diagnosis not present

## 2020-07-01 DIAGNOSIS — E1122 Type 2 diabetes mellitus with diabetic chronic kidney disease: Secondary | ICD-10-CM | POA: Diagnosis not present

## 2020-07-01 DIAGNOSIS — I5042 Chronic combined systolic (congestive) and diastolic (congestive) heart failure: Secondary | ICD-10-CM | POA: Diagnosis not present

## 2020-07-09 DIAGNOSIS — M4317 Spondylolisthesis, lumbosacral region: Secondary | ICD-10-CM | POA: Diagnosis not present

## 2020-07-09 DIAGNOSIS — E1122 Type 2 diabetes mellitus with diabetic chronic kidney disease: Secondary | ICD-10-CM | POA: Diagnosis not present

## 2020-07-09 DIAGNOSIS — I5042 Chronic combined systolic (congestive) and diastolic (congestive) heart failure: Secondary | ICD-10-CM | POA: Diagnosis not present

## 2020-07-09 DIAGNOSIS — Z4789 Encounter for other orthopedic aftercare: Secondary | ICD-10-CM | POA: Diagnosis not present

## 2020-07-09 DIAGNOSIS — I130 Hypertensive heart and chronic kidney disease with heart failure and stage 1 through stage 4 chronic kidney disease, or unspecified chronic kidney disease: Secondary | ICD-10-CM | POA: Diagnosis not present

## 2020-07-09 DIAGNOSIS — I426 Alcoholic cardiomyopathy: Secondary | ICD-10-CM | POA: Diagnosis not present

## 2020-07-11 ENCOUNTER — Ambulatory Visit (INDEPENDENT_AMBULATORY_CARE_PROVIDER_SITE_OTHER): Payer: Medicare Other | Admitting: Student

## 2020-07-11 ENCOUNTER — Other Ambulatory Visit: Payer: Self-pay

## 2020-07-11 ENCOUNTER — Encounter: Payer: Self-pay | Admitting: Student

## 2020-07-11 VITALS — BP 112/68 | HR 77 | Ht 61.0 in | Wt 138.0 lb

## 2020-07-11 DIAGNOSIS — I255 Ischemic cardiomyopathy: Secondary | ICD-10-CM

## 2020-07-11 DIAGNOSIS — I119 Hypertensive heart disease without heart failure: Secondary | ICD-10-CM

## 2020-07-11 DIAGNOSIS — Z950 Presence of cardiac pacemaker: Secondary | ICD-10-CM

## 2020-07-11 LAB — CUP PACEART INCLINIC DEVICE CHECK
Battery Remaining Longevity: 92 mo
Battery Voltage: 3.12 V
Brady Statistic AP VP Percent: 0.03 %
Brady Statistic AP VS Percent: 0.02 %
Brady Statistic AS VP Percent: 98.62 %
Brady Statistic AS VS Percent: 1.33 %
Brady Statistic RA Percent Paced: 0.05 %
Brady Statistic RV Percent Paced: 14.11 %
Date Time Interrogation Session: 20211028144217
Implantable Lead Implant Date: 20060214
Implantable Lead Implant Date: 20150406
Implantable Lead Implant Date: 20150406
Implantable Lead Location: 753858
Implantable Lead Location: 753859
Implantable Lead Location: 753860
Implantable Lead Model: 4396
Implantable Lead Model: 5076
Implantable Pulse Generator Implant Date: 20210610
Lead Channel Impedance Value: 1273 Ohm
Lead Channel Impedance Value: 1406 Ohm
Lead Channel Impedance Value: 361 Ohm
Lead Channel Impedance Value: 380 Ohm
Lead Channel Impedance Value: 380 Ohm
Lead Channel Impedance Value: 532 Ohm
Lead Channel Impedance Value: 570 Ohm
Lead Channel Impedance Value: 703 Ohm
Lead Channel Impedance Value: 779 Ohm
Lead Channel Pacing Threshold Amplitude: 1.125 V
Lead Channel Pacing Threshold Amplitude: 1.125 V
Lead Channel Pacing Threshold Amplitude: 1.75 V
Lead Channel Pacing Threshold Pulse Width: 0.4 ms
Lead Channel Pacing Threshold Pulse Width: 0.4 ms
Lead Channel Pacing Threshold Pulse Width: 0.6 ms
Lead Channel Sensing Intrinsic Amplitude: 2.75 mV
Lead Channel Sensing Intrinsic Amplitude: 3.625 mV
Lead Channel Sensing Intrinsic Amplitude: 5.875 mV
Lead Channel Sensing Intrinsic Amplitude: 7 mV
Lead Channel Setting Pacing Amplitude: 2.25 V
Lead Channel Setting Pacing Amplitude: 2.25 V
Lead Channel Setting Pacing Amplitude: 2.5 V
Lead Channel Setting Pacing Pulse Width: 0.4 ms
Lead Channel Setting Pacing Pulse Width: 0.8 ms
Lead Channel Setting Sensing Sensitivity: 0.9 mV
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
## 2020-07-11 NOTE — Patient Instructions (Signed)
Medication Instructions:  Your physician has recommended you make the following change in your medication:  -- STOP Digoxin --   *If you need a refill on your cardiac medications before your next appointment, please call your pharmacy*   Follow-Up: At [facility], you and your health needs are our priority.  As part of our continuing mission to provide you with exceptional heart care, we have created designated Provider Care Teams.  These Care Teams include your primary Cardiologist (physician) and Advanced Practice Providers (APPs -  Physician Assistants and Nurse Practitioners) who all work together to provide you with the care you need, when you need it.  We recommend signing up for the patient portal called "MyChart".  Sign up information is provided on this After Visit Summary.  MyChart is used to connect with patients for Virtual Visits (Telemedicine).  Patients are able to view lab/test results, encounter notes, upcoming appointments, etc.  Non-urgent messages can be sent to your provider as well.   To learn more about what you can do with MyChart, go to [email or web address].    Your next appointment:   Your physician recommends that you schedule a follow-up appointment in: 6 MONTHS with Dr. Lovena Le.  Remote monitoring is used to monitor your Pacemaker from home. This monitoring reduces the number of office visits required to check your device to one time per year. It allows [name] to keep an eye on the functioning of your device to ensure it is working properly. You are scheduled for a device check from home on 08/23/20. You may send your transmission at any time that day. If you have a wireless device, the transmission will be sent automatically. After your physician reviews your transmission, you will receive a postcard with your next transmission date.  The format for your next appointment:   In Person with Cristopher Peru, MD

## 2020-07-18 DIAGNOSIS — I426 Alcoholic cardiomyopathy: Secondary | ICD-10-CM | POA: Diagnosis not present

## 2020-07-18 DIAGNOSIS — I130 Hypertensive heart and chronic kidney disease with heart failure and stage 1 through stage 4 chronic kidney disease, or unspecified chronic kidney disease: Secondary | ICD-10-CM | POA: Diagnosis not present

## 2020-07-18 DIAGNOSIS — E1122 Type 2 diabetes mellitus with diabetic chronic kidney disease: Secondary | ICD-10-CM | POA: Diagnosis not present

## 2020-07-18 DIAGNOSIS — I5042 Chronic combined systolic (congestive) and diastolic (congestive) heart failure: Secondary | ICD-10-CM | POA: Diagnosis not present

## 2020-07-18 DIAGNOSIS — M4317 Spondylolisthesis, lumbosacral region: Secondary | ICD-10-CM | POA: Diagnosis not present

## 2020-07-18 DIAGNOSIS — Z4789 Encounter for other orthopedic aftercare: Secondary | ICD-10-CM | POA: Diagnosis not present

## 2020-07-21 DIAGNOSIS — Z4789 Encounter for other orthopedic aftercare: Secondary | ICD-10-CM | POA: Diagnosis not present

## 2020-07-23 DIAGNOSIS — M4326 Fusion of spine, lumbar region: Secondary | ICD-10-CM | POA: Diagnosis not present

## 2020-07-23 DIAGNOSIS — Z79891 Long term (current) use of opiate analgesic: Secondary | ICD-10-CM | POA: Diagnosis not present

## 2020-07-23 DIAGNOSIS — G894 Chronic pain syndrome: Secondary | ICD-10-CM | POA: Diagnosis not present

## 2020-07-23 DIAGNOSIS — Z79899 Other long term (current) drug therapy: Secondary | ICD-10-CM | POA: Diagnosis not present

## 2020-08-01 DIAGNOSIS — M79673 Pain in unspecified foot: Secondary | ICD-10-CM | POA: Diagnosis not present

## 2020-08-01 DIAGNOSIS — F5101 Primary insomnia: Secondary | ICD-10-CM | POA: Diagnosis not present

## 2020-08-01 DIAGNOSIS — M545 Low back pain, unspecified: Secondary | ICD-10-CM | POA: Diagnosis not present

## 2020-08-01 DIAGNOSIS — F411 Generalized anxiety disorder: Secondary | ICD-10-CM | POA: Diagnosis not present

## 2020-08-05 DIAGNOSIS — E109 Type 1 diabetes mellitus without complications: Secondary | ICD-10-CM | POA: Diagnosis not present

## 2020-08-05 DIAGNOSIS — E1140 Type 2 diabetes mellitus with diabetic neuropathy, unspecified: Secondary | ICD-10-CM | POA: Diagnosis not present

## 2020-08-05 DIAGNOSIS — R945 Abnormal results of liver function studies: Secondary | ICD-10-CM | POA: Diagnosis not present

## 2020-08-05 DIAGNOSIS — E139 Other specified diabetes mellitus without complications: Secondary | ICD-10-CM | POA: Diagnosis not present

## 2020-08-05 DIAGNOSIS — E785 Hyperlipidemia, unspecified: Secondary | ICD-10-CM | POA: Diagnosis not present

## 2020-08-05 DIAGNOSIS — Z6823 Body mass index (BMI) 23.0-23.9, adult: Secondary | ICD-10-CM | POA: Diagnosis not present

## 2020-08-05 DIAGNOSIS — I429 Cardiomyopathy, unspecified: Secondary | ICD-10-CM | POA: Diagnosis not present

## 2020-08-05 DIAGNOSIS — N184 Chronic kidney disease, stage 4 (severe): Secondary | ICD-10-CM | POA: Diagnosis not present

## 2020-08-05 DIAGNOSIS — E0821 Diabetes mellitus due to underlying condition with diabetic nephropathy: Secondary | ICD-10-CM | POA: Diagnosis not present

## 2020-08-06 DIAGNOSIS — I5042 Chronic combined systolic (congestive) and diastolic (congestive) heart failure: Secondary | ICD-10-CM | POA: Diagnosis not present

## 2020-08-06 DIAGNOSIS — I5043 Acute on chronic combined systolic (congestive) and diastolic (congestive) heart failure: Secondary | ICD-10-CM | POA: Diagnosis not present

## 2020-08-06 DIAGNOSIS — G9341 Metabolic encephalopathy: Secondary | ICD-10-CM | POA: Diagnosis not present

## 2020-08-06 DIAGNOSIS — B952 Enterococcus as the cause of diseases classified elsewhere: Secondary | ICD-10-CM | POA: Diagnosis not present

## 2020-08-06 DIAGNOSIS — N390 Urinary tract infection, site not specified: Secondary | ICD-10-CM | POA: Diagnosis not present

## 2020-08-06 DIAGNOSIS — I426 Alcoholic cardiomyopathy: Secondary | ICD-10-CM | POA: Diagnosis not present

## 2020-08-06 DIAGNOSIS — N319 Neuromuscular dysfunction of bladder, unspecified: Secondary | ICD-10-CM | POA: Diagnosis not present

## 2020-08-06 DIAGNOSIS — I130 Hypertensive heart and chronic kidney disease with heart failure and stage 1 through stage 4 chronic kidney disease, or unspecified chronic kidney disease: Secondary | ICD-10-CM | POA: Diagnosis not present

## 2020-08-06 DIAGNOSIS — D631 Anemia in chronic kidney disease: Secondary | ICD-10-CM | POA: Diagnosis not present

## 2020-08-06 DIAGNOSIS — I110 Hypertensive heart disease with heart failure: Secondary | ICD-10-CM | POA: Diagnosis not present

## 2020-08-06 DIAGNOSIS — N183 Chronic kidney disease, stage 3 unspecified: Secondary | ICD-10-CM | POA: Diagnosis not present

## 2020-08-06 DIAGNOSIS — E1122 Type 2 diabetes mellitus with diabetic chronic kidney disease: Secondary | ICD-10-CM | POA: Diagnosis not present

## 2020-08-12 ENCOUNTER — Ambulatory Visit (HOSPITAL_COMMUNITY)
Admission: RE | Admit: 2020-08-12 | Discharge: 2020-08-12 | Disposition: A | Discharge status: Home / Self Care | Payer: Medicare Other | Source: Ambulatory Visit | Attending: Internal Medicine | Admitting: Internal Medicine

## 2020-08-12 ENCOUNTER — Other Ambulatory Visit: Payer: Self-pay | Admitting: Adult Health Nurse Practitioner

## 2020-08-12 ENCOUNTER — Other Ambulatory Visit (HOSPITAL_COMMUNITY): Payer: Self-pay | Admitting: Internal Medicine

## 2020-08-12 ENCOUNTER — Other Ambulatory Visit: Payer: Self-pay | Admitting: Internal Medicine

## 2020-08-12 ENCOUNTER — Other Ambulatory Visit: Payer: Self-pay

## 2020-08-12 DIAGNOSIS — N202 Calculus of kidney with calculus of ureter: Secondary | ICD-10-CM

## 2020-08-12 DIAGNOSIS — M160 Bilateral primary osteoarthritis of hip: Secondary | ICD-10-CM | POA: Diagnosis not present

## 2020-08-12 DIAGNOSIS — E1122 Type 2 diabetes mellitus with diabetic chronic kidney disease: Secondary | ICD-10-CM | POA: Diagnosis not present

## 2020-08-12 DIAGNOSIS — D7389 Other diseases of spleen: Secondary | ICD-10-CM | POA: Diagnosis not present

## 2020-08-12 DIAGNOSIS — I5042 Chronic combined systolic (congestive) and diastolic (congestive) heart failure: Secondary | ICD-10-CM | POA: Diagnosis not present

## 2020-08-12 DIAGNOSIS — E1129 Type 2 diabetes mellitus with other diabetic kidney complication: Secondary | ICD-10-CM | POA: Diagnosis not present

## 2020-08-12 DIAGNOSIS — R10823 Right lower quadrant rebound abdominal tenderness: Secondary | ICD-10-CM

## 2020-08-12 DIAGNOSIS — M5134 Other intervertebral disc degeneration, thoracic region: Secondary | ICD-10-CM | POA: Diagnosis not present

## 2020-08-12 DIAGNOSIS — R800 Isolated proteinuria: Secondary | ICD-10-CM | POA: Diagnosis not present

## 2020-08-12 DIAGNOSIS — E211 Secondary hyperparathyroidism, not elsewhere classified: Secondary | ICD-10-CM | POA: Diagnosis not present

## 2020-08-12 DIAGNOSIS — R809 Proteinuria, unspecified: Secondary | ICD-10-CM | POA: Diagnosis not present

## 2020-08-12 DIAGNOSIS — N189 Chronic kidney disease, unspecified: Secondary | ICD-10-CM | POA: Diagnosis not present

## 2020-08-12 DIAGNOSIS — K4090 Unilateral inguinal hernia, without obstruction or gangrene, not specified as recurrent: Secondary | ICD-10-CM | POA: Diagnosis not present

## 2020-08-12 DIAGNOSIS — N390 Urinary tract infection, site not specified: Secondary | ICD-10-CM | POA: Diagnosis not present

## 2020-08-13 DIAGNOSIS — M48062 Spinal stenosis, lumbar region with neurogenic claudication: Secondary | ICD-10-CM | POA: Diagnosis not present

## 2020-08-13 DIAGNOSIS — M4326 Fusion of spine, lumbar region: Secondary | ICD-10-CM | POA: Diagnosis not present

## 2020-08-16 ENCOUNTER — Other Ambulatory Visit: Payer: Self-pay | Admitting: Nephrology

## 2020-08-16 ENCOUNTER — Other Ambulatory Visit (HOSPITAL_COMMUNITY): Payer: Self-pay | Admitting: Nephrology

## 2020-08-16 DIAGNOSIS — D638 Anemia in other chronic diseases classified elsewhere: Secondary | ICD-10-CM | POA: Diagnosis not present

## 2020-08-16 DIAGNOSIS — R809 Proteinuria, unspecified: Secondary | ICD-10-CM | POA: Diagnosis not present

## 2020-08-16 DIAGNOSIS — I5042 Chronic combined systolic (congestive) and diastolic (congestive) heart failure: Secondary | ICD-10-CM | POA: Diagnosis not present

## 2020-08-16 DIAGNOSIS — N170 Acute kidney failure with tubular necrosis: Secondary | ICD-10-CM

## 2020-08-16 DIAGNOSIS — E1122 Type 2 diabetes mellitus with diabetic chronic kidney disease: Secondary | ICD-10-CM | POA: Diagnosis not present

## 2020-08-16 DIAGNOSIS — E1129 Type 2 diabetes mellitus with other diabetic kidney complication: Secondary | ICD-10-CM | POA: Diagnosis not present

## 2020-08-16 DIAGNOSIS — N189 Chronic kidney disease, unspecified: Secondary | ICD-10-CM | POA: Diagnosis not present

## 2020-08-22 ENCOUNTER — Other Ambulatory Visit: Payer: Self-pay

## 2020-08-22 ENCOUNTER — Ambulatory Visit (HOSPITAL_COMMUNITY)
Admission: RE | Admit: 2020-08-22 | Discharge: 2020-08-22 | Disposition: A | Discharge status: Home / Self Care | Payer: Medicare Other | Source: Ambulatory Visit | Attending: Nephrology | Admitting: Nephrology

## 2020-08-22 DIAGNOSIS — N170 Acute kidney failure with tubular necrosis: Secondary | ICD-10-CM | POA: Diagnosis not present

## 2020-08-22 DIAGNOSIS — E1129 Type 2 diabetes mellitus with other diabetic kidney complication: Secondary | ICD-10-CM | POA: Diagnosis not present

## 2020-08-22 DIAGNOSIS — I5042 Chronic combined systolic (congestive) and diastolic (congestive) heart failure: Secondary | ICD-10-CM | POA: Diagnosis not present

## 2020-08-22 DIAGNOSIS — N189 Chronic kidney disease, unspecified: Secondary | ICD-10-CM | POA: Diagnosis not present

## 2020-08-22 DIAGNOSIS — N179 Acute kidney failure, unspecified: Secondary | ICD-10-CM | POA: Diagnosis not present

## 2020-08-22 DIAGNOSIS — E1122 Type 2 diabetes mellitus with diabetic chronic kidney disease: Secondary | ICD-10-CM | POA: Diagnosis not present

## 2020-08-22 DIAGNOSIS — R809 Proteinuria, unspecified: Secondary | ICD-10-CM | POA: Diagnosis not present

## 2020-08-22 DIAGNOSIS — D638 Anemia in other chronic diseases classified elsewhere: Secondary | ICD-10-CM | POA: Diagnosis not present

## 2020-08-22 DIAGNOSIS — D519 Vitamin B12 deficiency anemia, unspecified: Secondary | ICD-10-CM | POA: Diagnosis not present

## 2020-08-23 ENCOUNTER — Encounter (INDEPENDENT_AMBULATORY_CARE_PROVIDER_SITE_OTHER): Payer: Medicare Other

## 2020-08-23 DIAGNOSIS — F411 Generalized anxiety disorder: Secondary | ICD-10-CM | POA: Diagnosis not present

## 2020-08-23 DIAGNOSIS — K649 Unspecified hemorrhoids: Secondary | ICD-10-CM | POA: Diagnosis not present

## 2020-08-23 DIAGNOSIS — I5043 Acute on chronic combined systolic (congestive) and diastolic (congestive) heart failure: Secondary | ICD-10-CM

## 2020-08-23 DIAGNOSIS — I255 Ischemic cardiomyopathy: Secondary | ICD-10-CM

## 2020-08-23 DIAGNOSIS — M79673 Pain in unspecified foot: Secondary | ICD-10-CM | POA: Diagnosis not present

## 2020-08-23 DIAGNOSIS — F5101 Primary insomnia: Secondary | ICD-10-CM | POA: Diagnosis not present

## 2020-08-23 DIAGNOSIS — M545 Low back pain, unspecified: Secondary | ICD-10-CM | POA: Diagnosis not present

## 2020-08-26 LAB — CUP PACEART REMOTE DEVICE CHECK
Battery Remaining Longevity: 101 mo
Battery Voltage: 3.08 V
Brady Statistic AP VP Percent: 0.03 %
Brady Statistic AP VS Percent: 0.02 %
Brady Statistic AS VP Percent: 98.61 %
Brady Statistic AS VS Percent: 1.34 %
Brady Statistic RA Percent Paced: 0.05 %
Brady Statistic RV Percent Paced: 2.63 %
Date Time Interrogation Session: 20211210140501
Implantable Lead Implant Date: 20060214
Implantable Lead Implant Date: 20150406
Implantable Lead Implant Date: 20150406
Implantable Lead Location: 753858
Implantable Lead Location: 753859
Implantable Lead Location: 753860
Implantable Lead Model: 4396
Implantable Lead Model: 5076
Implantable Pulse Generator Implant Date: 20210610
Lead Channel Impedance Value: 1292 Ohm
Lead Channel Impedance Value: 1406 Ohm
Lead Channel Impedance Value: 361 Ohm
Lead Channel Impedance Value: 399 Ohm
Lead Channel Impedance Value: 418 Ohm
Lead Channel Impedance Value: 513 Ohm
Lead Channel Impedance Value: 570 Ohm
Lead Channel Impedance Value: 741 Ohm
Lead Channel Impedance Value: 798 Ohm
Lead Channel Pacing Threshold Amplitude: 1.125 V
Lead Channel Pacing Threshold Amplitude: 1.125 V
Lead Channel Pacing Threshold Amplitude: 1.625 V
Lead Channel Pacing Threshold Pulse Width: 0.4 ms
Lead Channel Pacing Threshold Pulse Width: 0.4 ms
Lead Channel Pacing Threshold Pulse Width: 0.8 ms
Lead Channel Sensing Intrinsic Amplitude: 2.75 mV
Lead Channel Sensing Intrinsic Amplitude: 2.75 mV
Lead Channel Sensing Intrinsic Amplitude: 5.75 mV
Lead Channel Sensing Intrinsic Amplitude: 5.75 mV
Lead Channel Setting Pacing Amplitude: 2.25 V
Lead Channel Setting Pacing Amplitude: 2.25 V
Lead Channel Setting Pacing Amplitude: 2.25 V
Lead Channel Setting Pacing Pulse Width: 0.4 ms
Lead Channel Setting Pacing Pulse Width: 0.8 ms
Lead Channel Setting Sensing Sensitivity: 0.9 mV

## 2020-08-28 DIAGNOSIS — D631 Anemia in chronic kidney disease: Secondary | ICD-10-CM | POA: Diagnosis not present

## 2020-08-28 DIAGNOSIS — N390 Urinary tract infection, site not specified: Secondary | ICD-10-CM | POA: Diagnosis not present

## 2020-08-28 DIAGNOSIS — G9341 Metabolic encephalopathy: Secondary | ICD-10-CM | POA: Diagnosis not present

## 2020-08-28 DIAGNOSIS — I5042 Chronic combined systolic (congestive) and diastolic (congestive) heart failure: Secondary | ICD-10-CM | POA: Diagnosis not present

## 2020-08-28 DIAGNOSIS — R10823 Right lower quadrant rebound abdominal tenderness: Secondary | ICD-10-CM | POA: Diagnosis not present

## 2020-08-28 DIAGNOSIS — I130 Hypertensive heart and chronic kidney disease with heart failure and stage 1 through stage 4 chronic kidney disease, or unspecified chronic kidney disease: Secondary | ICD-10-CM | POA: Diagnosis not present

## 2020-08-28 DIAGNOSIS — I5043 Acute on chronic combined systolic (congestive) and diastolic (congestive) heart failure: Secondary | ICD-10-CM | POA: Diagnosis not present

## 2020-08-28 DIAGNOSIS — I426 Alcoholic cardiomyopathy: Secondary | ICD-10-CM | POA: Diagnosis not present

## 2020-08-28 DIAGNOSIS — B952 Enterococcus as the cause of diseases classified elsewhere: Secondary | ICD-10-CM | POA: Diagnosis not present

## 2020-08-28 DIAGNOSIS — R800 Isolated proteinuria: Secondary | ICD-10-CM | POA: Diagnosis not present

## 2020-08-28 DIAGNOSIS — I110 Hypertensive heart disease with heart failure: Secondary | ICD-10-CM | POA: Diagnosis not present

## 2020-08-28 DIAGNOSIS — N319 Neuromuscular dysfunction of bladder, unspecified: Secondary | ICD-10-CM | POA: Diagnosis not present

## 2020-08-30 DIAGNOSIS — R809 Proteinuria, unspecified: Secondary | ICD-10-CM | POA: Diagnosis not present

## 2020-08-30 DIAGNOSIS — E211 Secondary hyperparathyroidism, not elsewhere classified: Secondary | ICD-10-CM | POA: Diagnosis not present

## 2020-08-30 DIAGNOSIS — E1122 Type 2 diabetes mellitus with diabetic chronic kidney disease: Secondary | ICD-10-CM | POA: Diagnosis not present

## 2020-08-30 DIAGNOSIS — N189 Chronic kidney disease, unspecified: Secondary | ICD-10-CM | POA: Diagnosis not present

## 2020-08-30 DIAGNOSIS — I5042 Chronic combined systolic (congestive) and diastolic (congestive) heart failure: Secondary | ICD-10-CM | POA: Diagnosis not present

## 2020-08-30 DIAGNOSIS — E1129 Type 2 diabetes mellitus with other diabetic kidney complication: Secondary | ICD-10-CM | POA: Diagnosis not present

## 2020-08-30 DIAGNOSIS — N170 Acute kidney failure with tubular necrosis: Secondary | ICD-10-CM | POA: Diagnosis not present

## 2020-08-30 DIAGNOSIS — D638 Anemia in other chronic diseases classified elsewhere: Secondary | ICD-10-CM | POA: Diagnosis not present

## 2020-09-03 DIAGNOSIS — R945 Abnormal results of liver function studies: Secondary | ICD-10-CM | POA: Diagnosis not present

## 2020-09-03 DIAGNOSIS — I4891 Unspecified atrial fibrillation: Secondary | ICD-10-CM | POA: Diagnosis not present

## 2020-09-03 DIAGNOSIS — F411 Generalized anxiety disorder: Secondary | ICD-10-CM | POA: Diagnosis not present

## 2020-09-03 DIAGNOSIS — M79673 Pain in unspecified foot: Secondary | ICD-10-CM | POA: Diagnosis not present

## 2020-09-03 DIAGNOSIS — N1832 Chronic kidney disease, stage 3b: Secondary | ICD-10-CM | POA: Diagnosis not present

## 2020-09-03 DIAGNOSIS — D649 Anemia, unspecified: Secondary | ICD-10-CM | POA: Diagnosis not present

## 2020-09-03 DIAGNOSIS — F5101 Primary insomnia: Secondary | ICD-10-CM | POA: Diagnosis not present

## 2020-09-03 DIAGNOSIS — N390 Urinary tract infection, site not specified: Secondary | ICD-10-CM | POA: Diagnosis not present

## 2020-09-03 DIAGNOSIS — E782 Mixed hyperlipidemia: Secondary | ICD-10-CM | POA: Diagnosis not present

## 2020-09-03 DIAGNOSIS — M545 Low back pain, unspecified: Secondary | ICD-10-CM | POA: Diagnosis not present

## 2020-09-03 DIAGNOSIS — K219 Gastro-esophageal reflux disease without esophagitis: Secondary | ICD-10-CM | POA: Diagnosis not present

## 2020-09-03 DIAGNOSIS — E0822 Diabetes mellitus due to underlying condition with diabetic chronic kidney disease: Secondary | ICD-10-CM | POA: Diagnosis not present

## 2020-09-04 DIAGNOSIS — Z6822 Body mass index (BMI) 22.0-22.9, adult: Secondary | ICD-10-CM | POA: Diagnosis not present

## 2020-09-04 DIAGNOSIS — M4326 Fusion of spine, lumbar region: Secondary | ICD-10-CM | POA: Diagnosis not present

## 2020-09-05 NOTE — Progress Notes (Signed)
Remote pacemaker transmission.   

## 2020-09-17 ENCOUNTER — Other Ambulatory Visit (HOSPITAL_COMMUNITY): Payer: Self-pay | Admitting: Internal Medicine

## 2020-09-17 ENCOUNTER — Other Ambulatory Visit: Payer: Self-pay | Admitting: Internal Medicine

## 2020-09-17 DIAGNOSIS — Z1231 Encounter for screening mammogram for malignant neoplasm of breast: Secondary | ICD-10-CM

## 2020-09-17 DIAGNOSIS — R748 Abnormal levels of other serum enzymes: Secondary | ICD-10-CM

## 2020-09-18 ENCOUNTER — Other Ambulatory Visit: Payer: Self-pay

## 2020-09-18 ENCOUNTER — Ambulatory Visit (HOSPITAL_COMMUNITY)
Admission: RE | Admit: 2020-09-18 | Discharge: 2020-09-18 | Disposition: A | Discharge status: Home / Self Care | Payer: Medicare Other | Source: Ambulatory Visit | Attending: Internal Medicine | Admitting: Internal Medicine

## 2020-09-18 DIAGNOSIS — Z9049 Acquired absence of other specified parts of digestive tract: Secondary | ICD-10-CM | POA: Diagnosis not present

## 2020-09-18 DIAGNOSIS — R748 Abnormal levels of other serum enzymes: Secondary | ICD-10-CM

## 2020-09-18 DIAGNOSIS — R7989 Other specified abnormal findings of blood chemistry: Secondary | ICD-10-CM | POA: Diagnosis not present

## 2020-10-02 ENCOUNTER — Ambulatory Visit (HOSPITAL_COMMUNITY): Payer: Medicare Other

## 2020-10-03 DIAGNOSIS — M4326 Fusion of spine, lumbar region: Secondary | ICD-10-CM | POA: Diagnosis not present

## 2020-10-03 DIAGNOSIS — R809 Proteinuria, unspecified: Secondary | ICD-10-CM | POA: Diagnosis not present

## 2020-10-03 DIAGNOSIS — E211 Secondary hyperparathyroidism, not elsewhere classified: Secondary | ICD-10-CM | POA: Diagnosis not present

## 2020-10-03 DIAGNOSIS — E1129 Type 2 diabetes mellitus with other diabetic kidney complication: Secondary | ICD-10-CM | POA: Diagnosis not present

## 2020-10-03 DIAGNOSIS — N189 Chronic kidney disease, unspecified: Secondary | ICD-10-CM | POA: Diagnosis not present

## 2020-10-03 DIAGNOSIS — E1122 Type 2 diabetes mellitus with diabetic chronic kidney disease: Secondary | ICD-10-CM | POA: Diagnosis not present

## 2020-10-03 DIAGNOSIS — N170 Acute kidney failure with tubular necrosis: Secondary | ICD-10-CM | POA: Diagnosis not present

## 2020-10-03 DIAGNOSIS — I5042 Chronic combined systolic (congestive) and diastolic (congestive) heart failure: Secondary | ICD-10-CM | POA: Diagnosis not present

## 2020-10-03 DIAGNOSIS — D638 Anemia in other chronic diseases classified elsewhere: Secondary | ICD-10-CM | POA: Diagnosis not present

## 2020-10-03 DIAGNOSIS — Z6822 Body mass index (BMI) 22.0-22.9, adult: Secondary | ICD-10-CM | POA: Diagnosis not present

## 2020-10-10 ENCOUNTER — Other Ambulatory Visit: Payer: Self-pay

## 2020-10-10 ENCOUNTER — Ambulatory Visit (HOSPITAL_COMMUNITY)
Admission: RE | Admit: 2020-10-10 | Discharge: 2020-10-10 | Disposition: A | Discharge status: Home / Self Care | Payer: Medicare Other | Source: Ambulatory Visit | Attending: Internal Medicine | Admitting: Internal Medicine

## 2020-10-10 DIAGNOSIS — Z1231 Encounter for screening mammogram for malignant neoplasm of breast: Secondary | ICD-10-CM | POA: Diagnosis not present

## 2020-10-11 DIAGNOSIS — E211 Secondary hyperparathyroidism, not elsewhere classified: Secondary | ICD-10-CM | POA: Diagnosis not present

## 2020-10-11 DIAGNOSIS — R809 Proteinuria, unspecified: Secondary | ICD-10-CM | POA: Diagnosis not present

## 2020-10-11 DIAGNOSIS — N189 Chronic kidney disease, unspecified: Secondary | ICD-10-CM | POA: Diagnosis not present

## 2020-10-11 DIAGNOSIS — I5042 Chronic combined systolic (congestive) and diastolic (congestive) heart failure: Secondary | ICD-10-CM | POA: Diagnosis not present

## 2020-10-11 DIAGNOSIS — N170 Acute kidney failure with tubular necrosis: Secondary | ICD-10-CM | POA: Diagnosis not present

## 2020-10-11 DIAGNOSIS — E1122 Type 2 diabetes mellitus with diabetic chronic kidney disease: Secondary | ICD-10-CM | POA: Diagnosis not present

## 2020-10-11 DIAGNOSIS — E1129 Type 2 diabetes mellitus with other diabetic kidney complication: Secondary | ICD-10-CM | POA: Diagnosis not present

## 2020-10-17 DIAGNOSIS — E109 Type 1 diabetes mellitus without complications: Secondary | ICD-10-CM | POA: Diagnosis not present

## 2020-10-23 DIAGNOSIS — N184 Chronic kidney disease, stage 4 (severe): Secondary | ICD-10-CM | POA: Diagnosis not present

## 2020-10-23 DIAGNOSIS — E0821 Diabetes mellitus due to underlying condition with diabetic nephropathy: Secondary | ICD-10-CM | POA: Diagnosis not present

## 2020-10-23 DIAGNOSIS — E1140 Type 2 diabetes mellitus with diabetic neuropathy, unspecified: Secondary | ICD-10-CM | POA: Diagnosis not present

## 2020-10-23 DIAGNOSIS — E139 Other specified diabetes mellitus without complications: Secondary | ICD-10-CM | POA: Diagnosis not present

## 2020-10-23 DIAGNOSIS — R945 Abnormal results of liver function studies: Secondary | ICD-10-CM | POA: Diagnosis not present

## 2020-10-23 DIAGNOSIS — I429 Cardiomyopathy, unspecified: Secondary | ICD-10-CM | POA: Diagnosis not present

## 2020-10-23 DIAGNOSIS — E785 Hyperlipidemia, unspecified: Secondary | ICD-10-CM | POA: Diagnosis not present

## 2020-10-23 DIAGNOSIS — E109 Type 1 diabetes mellitus without complications: Secondary | ICD-10-CM | POA: Diagnosis not present

## 2020-10-23 DIAGNOSIS — Z6823 Body mass index (BMI) 23.0-23.9, adult: Secondary | ICD-10-CM | POA: Diagnosis not present

## 2020-11-12 DIAGNOSIS — H25813 Combined forms of age-related cataract, bilateral: Secondary | ICD-10-CM | POA: Diagnosis not present

## 2020-11-12 DIAGNOSIS — Z794 Long term (current) use of insulin: Secondary | ICD-10-CM | POA: Diagnosis not present

## 2020-11-12 DIAGNOSIS — H16223 Keratoconjunctivitis sicca, not specified as Sjogren's, bilateral: Secondary | ICD-10-CM | POA: Diagnosis not present

## 2020-11-12 DIAGNOSIS — E109 Type 1 diabetes mellitus without complications: Secondary | ICD-10-CM | POA: Diagnosis not present

## 2020-11-12 DIAGNOSIS — H353131 Nonexudative age-related macular degeneration, bilateral, early dry stage: Secondary | ICD-10-CM | POA: Diagnosis not present

## 2020-11-12 DIAGNOSIS — H524 Presbyopia: Secondary | ICD-10-CM | POA: Diagnosis not present

## 2020-11-12 DIAGNOSIS — H52223 Regular astigmatism, bilateral: Secondary | ICD-10-CM | POA: Diagnosis not present

## 2020-11-12 DIAGNOSIS — H5203 Hypermetropia, bilateral: Secondary | ICD-10-CM | POA: Diagnosis not present

## 2020-11-14 ENCOUNTER — Ambulatory Visit: Payer: Medicare Other

## 2020-11-14 ENCOUNTER — Other Ambulatory Visit: Payer: Self-pay

## 2020-11-14 ENCOUNTER — Ambulatory Visit (INDEPENDENT_AMBULATORY_CARE_PROVIDER_SITE_OTHER): Payer: Medicare Other | Admitting: Orthopedic Surgery

## 2020-11-14 ENCOUNTER — Encounter: Payer: Self-pay | Admitting: Orthopedic Surgery

## 2020-11-14 VITALS — BP 168/96 | HR 92 | Ht 61.0 in | Wt 132.5 lb

## 2020-11-14 DIAGNOSIS — I429 Cardiomyopathy, unspecified: Secondary | ICD-10-CM | POA: Insufficient documentation

## 2020-11-14 DIAGNOSIS — G8929 Other chronic pain: Secondary | ICD-10-CM

## 2020-11-14 DIAGNOSIS — M7541 Impingement syndrome of right shoulder: Secondary | ICD-10-CM | POA: Diagnosis not present

## 2020-11-14 DIAGNOSIS — M25511 Pain in right shoulder: Secondary | ICD-10-CM | POA: Diagnosis not present

## 2020-11-14 DIAGNOSIS — E1121 Type 2 diabetes mellitus with diabetic nephropathy: Secondary | ICD-10-CM | POA: Insufficient documentation

## 2020-11-14 NOTE — Patient Instructions (Signed)
You have received an injection of steroids into the joint. 15% of patients will have increased pain within the 24 hours postinjection.   This is transient and will go away.   We recommend that you use ice packs on the injection site for 20 minutes every 2 hours and extra strength Tylenol 2 tablets every 8 as needed until the pain resolves.  If you continue to have pain after taking the Tylenol and using the ice please call the office for further instructions.  Start therapy

## 2020-11-14 NOTE — Progress Notes (Signed)
Chief Complaint  Patient presents with  . Shoulder Pain    R/ picking up my husband's wheelchair for quite awhile and now my shoulders hurt.RT is the worst.    70 year old female started having right shoulder pain 1 year ago.  Her husband had an amputation and she had to push him around in the wheelchair so she had to lift the wheelchair several times over several months and started having anterolateral right shoulder pain with radiation into the shoulder blade.  This is associated with pain when laying on her right side and pain with abduction and flexion.  There is been no trauma  Review of Systems  Musculoskeletal: Negative for myalgias.  Neurological: Negative for tingling and sensory change.  All other systems reviewed and are negative.  Past Medical History:  Diagnosis Date  . Anxiety   . Cardiomyopathy   . Cervical high risk HPV (human papillomavirus) test positive 01/30/2015  . Chronic constipation   . Chronic lower back pain   . Constipation 01/30/2015  . Depression   . GERD (gastroesophageal reflux disease)   . Heart failure    Chronic combined  . HTN (hypertension)   . Hypercholesteremia   . Neurogenic bladder    2008 nerve damage s/p back ssurgery  . Type II diabetes mellitus ([facility])    BP (!) 168/96   Pulse 92   Ht 5\' 1"  (1.549 m)   Wt 132 lb 8 oz (60.1 kg)   BMI 25.04 kg/m   General appearance ectomorphic  Mental status awake alert oriented x3  Mood affect normal  Normal pulse and perfusion upper extremities  Supraclavicular and axillary regions are devoid of any lymph node enlargement  Right shoulder is tender in the subacromial space anterior lateral shoulder and deltoid with intact 5 out of 5 abduction and flexion no restrictions in range of motion positive impingement sign negative abduction external rotation stress test  X-ray negative For acute processes  Encounter Diagnoses  Name Primary?  . Chronic right shoulder pain Yes  . Impingement  syndrome of right shoulder     Recommend physical therapy and subacromial injection  A steroid injection was performed at right shoulder subacromial space using 1% plain Lidocaine and 6 mg of Celestone. This was well tolerated.  Chronic pain with exacerbation + injection

## 2020-11-22 ENCOUNTER — Encounter (INDEPENDENT_AMBULATORY_CARE_PROVIDER_SITE_OTHER): Payer: Medicare Other

## 2020-11-22 DIAGNOSIS — I5043 Acute on chronic combined systolic (congestive) and diastolic (congestive) heart failure: Secondary | ICD-10-CM

## 2020-11-22 DIAGNOSIS — I255 Ischemic cardiomyopathy: Secondary | ICD-10-CM | POA: Diagnosis not present

## 2020-11-25 LAB — CUP PACEART REMOTE DEVICE CHECK
Battery Remaining Longevity: 98 mo
Battery Voltage: 3.02 V
Brady Statistic AP VP Percent: 0.01 %
Brady Statistic AP VS Percent: 0.01 %
Brady Statistic AS VP Percent: 98.7 %
Brady Statistic AS VS Percent: 1.28 %
Brady Statistic RA Percent Paced: 0.02 %
Brady Statistic RV Percent Paced: 24.86 %
Date Time Interrogation Session: 20220310222241
Implantable Lead Implant Date: 20060214
Implantable Lead Implant Date: 20150406
Implantable Lead Implant Date: 20150406
Implantable Lead Location: 753858
Implantable Lead Location: 753859
Implantable Lead Location: 753860
Implantable Lead Model: 4396
Implantable Lead Model: 5076
Implantable Pulse Generator Implant Date: 20210610
Lead Channel Impedance Value: 1273 Ohm
Lead Channel Impedance Value: 1387 Ohm
Lead Channel Impedance Value: 342 Ohm
Lead Channel Impedance Value: 418 Ohm
Lead Channel Impedance Value: 513 Ohm
Lead Channel Impedance Value: 551 Ohm
Lead Channel Impedance Value: 646 Ohm
Lead Channel Impedance Value: 798 Ohm
Lead Channel Impedance Value: 855 Ohm
Lead Channel Pacing Threshold Amplitude: 1.125 V
Lead Channel Pacing Threshold Amplitude: 1.125 V
Lead Channel Pacing Threshold Amplitude: 1.5 V
Lead Channel Pacing Threshold Pulse Width: 0.4 ms
Lead Channel Pacing Threshold Pulse Width: 0.4 ms
Lead Channel Pacing Threshold Pulse Width: 0.8 ms
Lead Channel Sensing Intrinsic Amplitude: 2.375 mV
Lead Channel Sensing Intrinsic Amplitude: 2.375 mV
Lead Channel Sensing Intrinsic Amplitude: 8.625 mV
Lead Channel Sensing Intrinsic Amplitude: 8.625 mV
Lead Channel Setting Pacing Amplitude: 2.25 V
Lead Channel Setting Pacing Amplitude: 2.25 V
Lead Channel Setting Pacing Amplitude: 2.5 V
Lead Channel Setting Pacing Pulse Width: 0.4 ms
Lead Channel Setting Pacing Pulse Width: 0.8 ms
Lead Channel Setting Sensing Sensitivity: 0.9 mV

## 2020-11-27 DIAGNOSIS — K219 Gastro-esophageal reflux disease without esophagitis: Secondary | ICD-10-CM | POA: Diagnosis not present

## 2020-11-27 DIAGNOSIS — E0822 Diabetes mellitus due to underlying condition with diabetic chronic kidney disease: Secondary | ICD-10-CM | POA: Diagnosis not present

## 2020-11-27 DIAGNOSIS — M545 Low back pain, unspecified: Secondary | ICD-10-CM | POA: Diagnosis not present

## 2020-11-27 DIAGNOSIS — D649 Anemia, unspecified: Secondary | ICD-10-CM | POA: Diagnosis not present

## 2020-11-27 DIAGNOSIS — F5101 Primary insomnia: Secondary | ICD-10-CM | POA: Diagnosis not present

## 2020-11-27 DIAGNOSIS — R945 Abnormal results of liver function studies: Secondary | ICD-10-CM | POA: Diagnosis not present

## 2020-11-27 DIAGNOSIS — F411 Generalized anxiety disorder: Secondary | ICD-10-CM | POA: Diagnosis not present

## 2020-11-27 DIAGNOSIS — N1832 Chronic kidney disease, stage 3b: Secondary | ICD-10-CM | POA: Diagnosis not present

## 2020-11-27 DIAGNOSIS — E782 Mixed hyperlipidemia: Secondary | ICD-10-CM | POA: Diagnosis not present

## 2020-11-27 DIAGNOSIS — I4891 Unspecified atrial fibrillation: Secondary | ICD-10-CM | POA: Diagnosis not present

## 2020-11-27 DIAGNOSIS — M79673 Pain in unspecified foot: Secondary | ICD-10-CM | POA: Diagnosis not present

## 2020-11-28 ENCOUNTER — Encounter (HOSPITAL_COMMUNITY): Payer: Self-pay | Admitting: Specialist

## 2020-11-28 ENCOUNTER — Other Ambulatory Visit: Payer: Self-pay

## 2020-11-28 ENCOUNTER — Ambulatory Visit (HOSPITAL_COMMUNITY): Payer: Medicare Other | Attending: Orthopedic Surgery | Admitting: Specialist

## 2020-11-28 DIAGNOSIS — M25611 Stiffness of right shoulder, not elsewhere classified: Secondary | ICD-10-CM | POA: Diagnosis not present

## 2020-11-28 DIAGNOSIS — M25511 Pain in right shoulder: Secondary | ICD-10-CM | POA: Insufficient documentation

## 2020-11-28 DIAGNOSIS — R29898 Other symptoms and signs involving the musculoskeletal system: Secondary | ICD-10-CM | POA: Diagnosis not present

## 2020-11-28 NOTE — Therapy (Signed)
[place], [place], [address] Phone: [phone]   Fax:  [phone]  Occupational Therapy Evaluation  Patient Details  Name: [name] MRN: [id number] Date of Birth: 04-Nov-1950 Referring Provider (OT): Dr. Arther Abbott   Encounter Date: 11/28/2020   OT End of Session - 11/28/20 1707    Visit Number 1    Number of Visits 12    Date for OT Re-Evaluation 01/09/21   mini reassess on 12/19/20   Authorization Type [employer] and [employer] - 42 visit    Authorization - Visit Number 1    Authorization - Number of Visits 30    Progress Note Due on Visit 10    OT Start Time 1350    OT Stop Time 1425    OT Time Calculation (min) 35 min    Activity Tolerance Patient tolerated treatment well    Behavior During Therapy [facility] for tasks assessed/performed           Past Medical History:  Diagnosis Date  . Anxiety   . Cardiomyopathy   . Cervical high risk HPV (human papillomavirus) test positive 01/30/2015  . Chronic constipation   . Chronic lower back pain   . Constipation 01/30/2015  . Depression   . GERD (gastroesophageal reflux disease)   . Heart failure    Chronic combined  . HTN (hypertension)   . Hypercholesteremia   . Neurogenic bladder    2008 nerve damage s/p back ssurgery  . Type II diabetes mellitus ([place])     Past Surgical History:  Procedure Laterality Date  . ACHILLES TENDON SURGERY Right 06/2012  . BACK SURGERY  2005  . BI-VENTRICULAR IMPLANTABLE CARDIOVERTER DEFIBRILLATOR  (CRT-D)  12-18-2013   upgrade of previously implanted dual chamber pacemaker to MDT CRTD with RV lead revision by Dr Lovena Le  . BI-VENTRICULAR PACEMAKER UPGRADE  12/18/2013   w/defibrillator  . BIV PACEMAKER GENERATOR CHANGE OUT N/A 12/18/2013   Procedure: BIV PACEMAKER GENERATOR CHANGE OUT;  Surgeon: Evans Lance, MD;  Location: [facility] CATH LAB;  Service: Cardiovascular;  Laterality: N/A;  . BIV PACEMAKER INSERTION CRT-P N/A 02/22/2020    Procedure: DOWNGRADE BIV PACEMAKER INSERTION CRT-P;  Surgeon: Evans Lance, MD;  Location: [place] CV LAB;  Service: Cardiovascular;  Laterality: N/A;  . BREAST BIOPSY Left 1973   benign  . BREAST LUMPECTOMY Left 1973  . CESAREAN SECTION  1981  . CHOLECYSTECTOMY    . GASTROCNEMIUS RECESSION  06/24/2012   Procedure: GASTROCNEMIUS SLIDE;  Surgeon: Colin Rhein, MD;  Location: [facility] ORS;  Service: Orthopedics;  Laterality: Right;  . HERNIA REPAIR    . LEAD REVISION N/A 12/18/2013   Procedure: LEAD REVISION;  Surgeon: Evans Lance, MD;  Location: [facility] CATH LAB;  Service: Cardiovascular;  Laterality: N/A;  . PACEMAKER INSERTION  2006   Dual chamber MDT pacemaker implanted - subsequent upgrade to CRTD 12-2013  . POSTERIOR LUMBAR FUSION  2006; 2008  . SIGMOIDOSCOPY  MAY 2011 w/ PROP   iTCS-->FSIG-poor bowel prep  . TUBAL LIGATION  ~ 1984  . UPPER GASTROINTESTINAL ENDOSCOPY  MAY 2011 w/ PROP   MILD GASTRITIS 2o ETOH    There were no vitals filed for this visit.   Subjective Assessment - 11/28/20 1700    Subjective  S:  I have to do everything for my husband now, and I am having alot of shoulder pain because of that.    Pertinent History [name] reports that  she has been experiencing increased pain in her right shoulder for several months.  Her husband had a leg amputation recently and now requires use of a wc, increased lifting of wc and caring for her husband has irritated her shoulder.  She consulted with Dr. Aline Brochure, recieved a shoulder injection with minimal relief, and has been referred to occupational therapy for evaluation and treatment.    Patient Stated Goals I want to be pain free    Currently in Pain? Yes    Pain Score 8     Pain Location Shoulder    Pain Orientation Right    Pain Descriptors / Indicators Aching    Pain Type Acute pain    Pain Radiating Towards upper arm    Pain Onset More than a month ago    Pain Frequency Constant    Aggravating Factors  use,  laying on it    Pain Relieving Factors nothing    Effect of Pain on Daily Activities moderate             [facility] OT Assessment - 11/28/20 0001      Assessment   Medical Diagnosis Right Shoulder Pain    Referring Provider (OT) Dr. Arther Abbott    Onset Date/Surgical Date --   unknown   Hand Dominance Right    Prior Therapy no      Precautions   Precautions ICD/Pacemaker      Restrictions   Weight Bearing Restrictions No      Balance Screen   Has the patient fallen in the past 6 months No    Has the patient had a decrease in activity level because of a fear of falling?  No    Is the patient reluctant to leave their home because of a fear of falling?  No      Home  Environment   Family/patient expects to be discharged to: Private residence    Living Arrangements Spouse/significant other      Prior Function   Level of [place] Retired    Leisure working in her yard      ADL   ADL comments Patient is sole caregiver for her husband who recently had a LE amputation.  He requires max assist with most BADLs and she must complete all upkeep of the home both inside and out.  She has increased pain reaching overhead, behind her back, has pain in her shoulder when lying down in her bed, lifting heavy items such as her husband's wheelchair, vacuuming sweeping, and cleaning the bathtub are all difficult to complete and painful      Written Expression   Dominant Hand Right      Vision - History   Baseline Vision No visual deficits      Cognition   Overall Cognitive Status Within Functional Limits for tasks assessed      Observation/Other Assessments   Focus on Therapeutic Outcomes (FOTO)  50% limited      Sensation   Light Touch Appears Intact      Coordination   Gross Motor Movements are Fluid and Coordinated Yes    Fine Motor Movements are Fluid and Coordinated Yes      ROM / Strength   AROM / PROM / Strength AROM;Strength      Palpation    Palpation comment max fascial restrictions in right upper arm, scapular, shoulder region      AROM   Overall AROM Comments assessed in seated, external  and internal rotation with shoulder adducted    AROM Assessment Site Shoulder    Right/Left Shoulder Right    Right Shoulder Flexion 135 Degrees    Right Shoulder ABduction 130 Degrees    Right Shoulder Internal Rotation 90 Degrees    Right Shoulder External Rotation 72 Degrees      Strength   Overall Strength Comments as above    Strength Assessment Site Shoulder    Right/Left Shoulder Right    Right Shoulder Flexion 4-/5    Right Shoulder ABduction 4-/5    Right Shoulder Internal Rotation 4-/5    Right Shoulder External Rotation 4-/5                    OT Treatments/Exercises (OP) - 11/28/20 0001      Manual Therapy   Manual Therapy Myofascial release    Manual therapy comments manual therapy completed seperately from all other interventions    Myofascial Release myofascial release and manual stretching to right upper arm, scapular and shoulder region to decrease pain and restrictions and improve pain free mobility in right shoulder region.                 OT Education - 11/28/20 1705    Education Details educated on shoulder stretches, use of heat for pain control and consideration of hiring help for some IADLs as she has increased responsibilities caring for her husband.    Person(s) Educated Patient    Methods Explanation;Handout;Demonstration    Comprehension Verbalized understanding;Returned demonstration            OT Short Term Goals - 11/28/20 1716      OT SHORT TERM GOAL #1   Title Patient will be educated on a HEP for improved right shoulder mobility.    Time 3    Period Weeks    Status New    Target Date 12/19/20      OT SHORT TERM GOAL #2   Title Patient will decrease pain in her right shoulder to 6/10 or better during adl completion.    Time 3    Period Weeks    Status New              OT Long Term Goals - 11/28/20 1717      OT LONG TERM GOAL #1   Title Patient will return to prior level of function with all desired daily tasks.    Time 6    Period Weeks    Status New    Target Date 01/09/21      OT LONG TERM GOAL #2   Title Patient will improve right shoulder a/rom to WNL for improved ability to reach overhead and behind her back.    Time 6    Period Weeks    Status New      OT LONG TERM GOAL #3   Title Patient will improbe right shoulder strength to 5/5 for improved ability to lift her husbands wheelchair and complete yard work.    Time 6    Period Weeks    Status New      OT LONG TERM GOAL #4   Title Patient will have 3/10 or less pain in her right shoulder region when completing adl tasks.    Time 6    Period Weeks    Status New                 Plan - 11/28/20 1712    Clinical  Impression Statement A:  Patient is a 70 year old female with past medical history significant for back surgery, pacemaker, and diabetes.  Patient's husband had a LE amputation a few months ago and she has had increased lifitng of wc, assisting with transfers, ADLs since his amputation.  As a result, she has increased right shoulder pain and decreased mobilty.  She continues to complete her daily tasks, as she does not have assistance, it is just very painful.    OT Occupational Profile and History Problem Focused Assessment - Including review of records relating to presenting problem    Occupational performance deficits (Please refer to evaluation for details): ADL's;IADL's;Rest and Sleep;Leisure    Body Structure / Function / Physical Skills ADL;Strength;Pain;UE functional use;ROM;Muscle spasms;Fascial restriction    Rehab Potential Good    Clinical Decision Making Limited treatment options, no task modification necessary    Comorbidities Affecting Occupational Performance: None    Modification or Assistance to Complete Evaluation  No modification of tasks or  assist necessary to complete eval    OT Frequency 2x / week    OT Duration 6 weeks    OT Treatment/Interventions Self-care/ADL training;Therapeutic exercise;Patient/family education;Neuromuscular education;Moist Heat;Energy conservation;Therapeutic activities;Passive range of motion;Manual Therapy;Cryotherapy    Plan P:  Skilled OT intervention to decrease pain and improve pain free mobility in her right shoulder for less pain during daily tasks.  next session:  myofascial release, manual stretching aa.rom progress to a.rom.    OT Home Exercise Plan eval: shoulder stretches and heat for pain relief    Consulted and Agree with Plan of Care Patient           Patient will benefit from skilled therapeutic intervention in order to improve the following deficits and impairments:   Body Structure / Function / Physical Skills: ADL,Strength,Pain,UE functional use,ROM,Muscle spasms,Fascial restriction       Visit Diagnosis: Acute pain of right shoulder - Plan: Ot plan of care cert/re-cert  Other symptoms and signs involving the musculoskeletal system - Plan: Ot plan of care cert/re-cert  Stiffness of right shoulder, not elsewhere classified - Plan: Ot plan of care cert/re-cert    Problem List Patient Active Problem List   Diagnosis Date Noted  . Cardiomyopathy ([place]) 11/14/2020  . Diabetic nephropathy ([place]) 11/14/2020  . Impaired mobility and activities of daily living 06/10/2020  . Alcohol withdrawal syndrome, with delirium ([place]) 06/09/2020  . Chronic alcohol abuse 06/09/2020  . Facet arthropathy, lumbar 06/09/2020  . Sepsis ([place]) 06/09/2020  . Sinus tachycardia by electrocardiogram 06/09/2020  . Spondylolisthesis of lumbosacral region 06/09/2020  . Altered mental status 03/12/2020  . Stage 3b chronic kidney disease ([place]) 10/24/2019  . Urine leukocytes 09/24/2019  . Other microscopic hematuria 09/24/2019  . Postmenopause 02/08/2018  . Screening for colorectal cancer 02/08/2018  .  Well woman exam with routine gynecological exam 02/08/2018  . Anemia in chronic kidney disease 12/15/2016  . Essential hypertension, benign 09/12/2015  . Cervical high risk HPV (human papillomavirus) test positive 01/30/2015  . Biventricular ICD (implantable cardioverter-defibrillator) in place 04/19/2014  . Chronic systolic heart failure ([place]) 12/18/2013  . Preop cardiovascular exam 11/17/2011  . Colon cancer screening 02/04/2011  . Uncontrolled type 2 diabetes mellitus with stage 3 chronic kidney disease, with long-term current use of insulin ([place]) 12/31/2009  . Mixed hyperlipidemia 12/31/2009  . ANXIETY DEPRESSION 12/31/2009  . GASTROESOPHAGEAL REFLUX DISEASE, CHRONIC 12/31/2009  . CONSTIPATION, CHRONIC 12/31/2009  . BACK PAIN, CHRONIC 12/31/2009  . Cerebral vascular accident ([place]) 05/22/2009  .  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 HYPERTENSION, HEART CONTROLLED W/O ASSOC CHF 11/27/2008  . COMBINED HEART FAILURE, CHRONIC 11/27/2008  . PACEMAKER, PERMANENT 11/27/2008    Vangie Bicker, [place], OTR/L [phone]  11/28/2020, 9:41 PM  [place], [place], [address] Phone: [phone]   Fax:  [phone]  Name: [name] MRN: [id number] Date of Birth: July 18, 1951

## 2020-11-28 NOTE — Patient Instructions (Signed)

## 2020-12-02 NOTE — Progress Notes (Signed)
Remote pacemaker transmission.   

## 2020-12-03 DIAGNOSIS — E211 Secondary hyperparathyroidism, not elsewhere classified: Secondary | ICD-10-CM | POA: Diagnosis not present

## 2020-12-03 DIAGNOSIS — R809 Proteinuria, unspecified: Secondary | ICD-10-CM | POA: Diagnosis not present

## 2020-12-03 DIAGNOSIS — I5042 Chronic combined systolic (congestive) and diastolic (congestive) heart failure: Secondary | ICD-10-CM | POA: Diagnosis not present

## 2020-12-03 DIAGNOSIS — N170 Acute kidney failure with tubular necrosis: Secondary | ICD-10-CM | POA: Diagnosis not present

## 2020-12-03 DIAGNOSIS — N189 Chronic kidney disease, unspecified: Secondary | ICD-10-CM | POA: Diagnosis not present

## 2020-12-03 DIAGNOSIS — E1129 Type 2 diabetes mellitus with other diabetic kidney complication: Secondary | ICD-10-CM | POA: Diagnosis not present

## 2020-12-03 DIAGNOSIS — E1122 Type 2 diabetes mellitus with diabetic chronic kidney disease: Secondary | ICD-10-CM | POA: Diagnosis not present

## 2020-12-06 ENCOUNTER — Ambulatory Visit (HOSPITAL_COMMUNITY): Payer: Medicare Other | Admitting: Occupational Therapy

## 2020-12-06 ENCOUNTER — Encounter (HOSPITAL_COMMUNITY): Payer: Self-pay | Admitting: Occupational Therapy

## 2020-12-06 ENCOUNTER — Other Ambulatory Visit: Payer: Self-pay

## 2020-12-06 DIAGNOSIS — R29898 Other symptoms and signs involving the musculoskeletal system: Secondary | ICD-10-CM

## 2020-12-06 DIAGNOSIS — M25611 Stiffness of right shoulder, not elsewhere classified: Secondary | ICD-10-CM

## 2020-12-06 DIAGNOSIS — M25511 Pain in right shoulder: Secondary | ICD-10-CM | POA: Diagnosis not present

## 2020-12-06 NOTE — Therapy (Signed)
[place], [place], [address] Phone: [phone]   Fax:  [phone]  Occupational Therapy Treatment  Patient Details  Name: [name] MRN: [id number] Date of Birth: 05/26/1951 Referring Provider (OT): Dr. Arther Abbott   Encounter Date: 12/06/2020   OT End of Session - 12/06/20 1524    Visit Number 2    Number of Visits 12    Date for OT Re-Evaluation 01/09/21   mini reassess on 12/19/20   Authorization Type [employer] and [employer] - 64 visit    Authorization - Visit Number 2    Authorization - Number of Visits 30    Progress Note Due on Visit 10    OT Start Time 1430    OT Stop Time 1510    OT Time Calculation (min) 40 min    Activity Tolerance Patient tolerated treatment well    Behavior During Therapy [facility] for tasks assessed/performed           Past Medical History:  Diagnosis Date  . Anxiety   . Cardiomyopathy   . Cervical high risk HPV (human papillomavirus) test positive 01/30/2015  . Chronic constipation   . Chronic lower back pain   . Constipation 01/30/2015  . Depression   . GERD (gastroesophageal reflux disease)   . Heart failure    Chronic combined  . HTN (hypertension)   . Hypercholesteremia   . Neurogenic bladder    2008 nerve damage s/p back ssurgery  . Type II diabetes mellitus ([place])     Past Surgical History:  Procedure Laterality Date  . ACHILLES TENDON SURGERY Right 06/2012  . BACK SURGERY  2005  . BI-VENTRICULAR IMPLANTABLE CARDIOVERTER DEFIBRILLATOR  (CRT-D)  12-18-2013   upgrade of previously implanted dual chamber pacemaker to MDT CRTD with RV lead revision by Dr Lovena Le  . BI-VENTRICULAR PACEMAKER UPGRADE  12/18/2013   w/defibrillator  . BIV PACEMAKER GENERATOR CHANGE OUT N/A 12/18/2013   Procedure: BIV PACEMAKER GENERATOR CHANGE OUT;  Surgeon: Evans Lance, MD;  Location: [facility] CATH LAB;  Service: Cardiovascular;  Laterality: N/A;  . BIV PACEMAKER INSERTION CRT-P N/A 02/22/2020    Procedure: DOWNGRADE BIV PACEMAKER INSERTION CRT-P;  Surgeon: Evans Lance, MD;  Location: [place] CV LAB;  Service: Cardiovascular;  Laterality: N/A;  . BREAST BIOPSY Left 1973   benign  . BREAST LUMPECTOMY Left 1973  . CESAREAN SECTION  1981  . CHOLECYSTECTOMY    . GASTROCNEMIUS RECESSION  06/24/2012   Procedure: GASTROCNEMIUS SLIDE;  Surgeon: Colin Rhein, MD;  Location: [facility] ORS;  Service: Orthopedics;  Laterality: Right;  . HERNIA REPAIR    . LEAD REVISION N/A 12/18/2013   Procedure: LEAD REVISION;  Surgeon: Evans Lance, MD;  Location: [facility] CATH LAB;  Service: Cardiovascular;  Laterality: N/A;  . PACEMAKER INSERTION  2006   Dual chamber MDT pacemaker implanted - subsequent upgrade to CRTD 12-2013  . POSTERIOR LUMBAR FUSION  2006; 2008  . SIGMOIDOSCOPY  MAY 2011 w/ PROP   iTCS-->FSIG-poor bowel prep  . TUBAL LIGATION  ~ 1984  . UPPER GASTROINTESTINAL ENDOSCOPY  MAY 2011 w/ PROP   MILD GASTRITIS 2o ETOH    There were no vitals filed for this visit.   Subjective Assessment - 12/06/20 1429    Subjective  S: "I have on a lidocaine pain patch on today. My shoulder hurt at night after my last session."    Currently in Pain? Yes    Pain Score  8     Pain Location Shoulder    Pain Orientation Right    Pain Descriptors / Indicators Sharp    Pain Type Chronic pain    Pain Radiating Towards does not radiate    Pain Onset More than a month ago    Pain Frequency Constant    Aggravating Factors  Movement.    Pain Relieving Factors Pain patch.    Effect of Pain on Daily Activities moderate.    Multiple Pain Sites No              [facility] OT Assessment - 12/06/20 0001      Assessment   Medical Diagnosis Right Shoulder Pain      Precautions   Precautions ICD/Pacemaker                    OT Treatments/Exercises (OP) - 12/06/20 1433      Exercises   Exercises Shoulder      Shoulder Exercises: Supine   Protraction PROM;10 reps;AAROM    Horizontal ABduction  PROM;AAROM;10 reps    External Rotation PROM;10 reps;AAROM    Internal Rotation PROM;10 reps;AAROM    Flexion PROM;AAROM;10 reps   noted "catch" in flexion and horizontal abduction; Joint distraction decreased sensation during flexion. Auditory popping noted as well.   ABduction PROM;AAROM;10 reps      Shoulder Exercises: Standing   Protraction AAROM;10 reps    Horizontal ABduction AAROM;10 reps    External Rotation AAROM;10 reps    Flexion AAROM;10 reps    ABduction AAROM;10 reps    Other Standing Exercises pvc pipe A/AROM x10      Shoulder Exercises: Pulleys   Flexion 1 minute    ABduction 1 minute      Shoulder Exercises: ROM/Strengthening   Wall Wash 1' wall wash      Manual Therapy   Manual Therapy Myofascial release    Manual therapy comments manual therapy completed seperately from all other interventions    Myofascial Release myofascial release and manual stretching to right upper arm, scapular and shoulder region to decrease pain and restrictions and improve pain free mobility in right shoulder region.                  OT Education - 12/06/20 1522    Education Details Educated on R shoulder A/AROM, provided HEP; encouraged to use heat at home for pain.            OT Short Term Goals - 12/06/20 1536      OT SHORT TERM GOAL #1   Title Patient will be educated on a HEP for improved right shoulder mobility.    Time 3    Period Weeks    Status On-going    Target Date 12/19/20      OT SHORT TERM GOAL #2   Title Patient will decrease pain in her right shoulder to 6/10 or better during adl completion.    Time 3    Period Weeks    Status On-going             OT Long Term Goals - 12/06/20 1536      OT LONG TERM GOAL #1   Title Patient will return to prior level of function with all desired daily tasks.    Time 6    Period Weeks    Status On-going      OT LONG TERM GOAL #2   Title Patient will improve right shoulder a/rom to  WNL for improved  ability to reach overhead and behind her back.    Time 6    Period Weeks    Status On-going      OT LONG TERM GOAL #3   Title Patient will improbe right shoulder strength to 5/5 for improved ability to lift her husbands wheelchair and complete yard work.    Time 6    Period Weeks    Status On-going      OT LONG TERM GOAL #4   Title Patient will have 3/10 or less pain in her right shoulder region when completing adl tasks.    Time 6    Period Weeks    Status On-going                 Plan - 12/06/20 1525    Clinical Impression Statement A: Patient presented with moderate fascial restrictions primarily in scapular, trapezius and anterior shoulder regions. Pt benefited from joint distraction during P/ROM for R shoulder flexion. Pt was able to complete A/AROM in supine as well as in standing. Demosntration, verbal, and tacile cuing needed at times for form. Pt also tolerated pulleys, pvc pipe stretch, and wall wash well. Pt reportedly wearing a lidocaine patch today for the pain.    OT Treatment/Interventions Self-care/ADL training;Therapeutic exercise;Patient/family education;Neuromuscular education;Moist Heat;Energy conservation;Therapeutic activities;Passive range of motion;Manual Therapy;Cryotherapy    Plan P: Continue session starting with myofascial release, P/ROM, and A/AROM seated and standing; Possibly progress to thumb tacks, and proximal shoulder girdle strengthening. Possibly try towel stretch for reaching behind back.    OT Home Exercise Plan eval: shoulder stretches and heat for pain relief ;3/25: shoulder A/AROM handout    Consulted and Agree with Plan of Care Patient           Patient will benefit from skilled therapeutic intervention in order to improve the following deficits and impairments:           Visit Diagnosis: Acute pain of right shoulder  Other symptoms and signs involving the musculoskeletal system  Stiffness of right shoulder, not elsewhere  classified    Problem List Patient Active Problem List   Diagnosis Date Noted  . Cardiomyopathy ([place]) 11/14/2020  . Diabetic nephropathy ([place]) 11/14/2020  . Impaired mobility and activities of daily living 06/10/2020  . Alcohol withdrawal syndrome, with delirium ([place]) 06/09/2020  . Chronic alcohol abuse 06/09/2020  . Facet arthropathy, lumbar 06/09/2020  . Sepsis ([place]) 06/09/2020  . Sinus tachycardia by electrocardiogram 06/09/2020  . Spondylolisthesis of lumbosacral region 06/09/2020  . Altered mental status 03/12/2020  . Stage 3b chronic kidney disease ([place]) 10/24/2019  . Urine leukocytes 09/24/2019  . Other microscopic hematuria 09/24/2019  . Postmenopause 02/08/2018  . Screening for colorectal cancer 02/08/2018  . Well woman exam with routine gynecological exam 02/08/2018  . Anemia in chronic kidney disease 12/15/2016  . Essential hypertension, benign 09/12/2015  . Cervical high risk HPV (human papillomavirus) test positive 01/30/2015  . Biventricular ICD (implantable cardioverter-defibrillator) in place 04/19/2014  . Chronic systolic heart failure ([place]) 12/18/2013  . Preop cardiovascular exam 11/17/2011  . Colon cancer screening 02/04/2011  . Uncontrolled type 2 diabetes mellitus with stage 3 chronic kidney disease, with long-term current use of insulin ([place]) 12/31/2009  . Mixed hyperlipidemia 12/31/2009  . ANXIETY DEPRESSION 12/31/2009  . GASTROESOPHAGEAL REFLUX DISEASE, CHRONIC 12/31/2009  . CONSTIPATION, CHRONIC 12/31/2009  . BACK PAIN, CHRONIC 12/31/2009  . Cerebral vascular accident ([place]) 05/22/2009  . 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  HYPERTENSION, HEART CONTROLLED W/O ASSOC CHF 11/27/2008  . COMBINED HEART FAILURE, CHRONIC 11/27/2008  . PACEMAKER, PERMANENT 11/27/2008   Larey Seat OT, MOT  Larey Seat 12/06/2020, 3:39 PM  [place] [address] [place],  [place], [address] Phone: [phone]   Fax:  [phone]  Name: ALEXANDRE LIGHTSEY MRN: [id number] Date of Birth: 17-Oct-1950

## 2020-12-06 NOTE — Patient Instructions (Signed)

## 2020-12-10 ENCOUNTER — Ambulatory Visit (HOSPITAL_COMMUNITY): Payer: Medicare Other

## 2020-12-12 ENCOUNTER — Ambulatory Visit (HOSPITAL_COMMUNITY): Payer: Medicare Other | Admitting: Occupational Therapy

## 2020-12-12 ENCOUNTER — Encounter (HOSPITAL_COMMUNITY): Payer: Self-pay | Admitting: Occupational Therapy

## 2020-12-12 ENCOUNTER — Other Ambulatory Visit: Payer: Self-pay

## 2020-12-12 DIAGNOSIS — M25511 Pain in right shoulder: Secondary | ICD-10-CM

## 2020-12-12 DIAGNOSIS — M25611 Stiffness of right shoulder, not elsewhere classified: Secondary | ICD-10-CM

## 2020-12-12 DIAGNOSIS — R29898 Other symptoms and signs involving the musculoskeletal system: Secondary | ICD-10-CM

## 2020-12-12 NOTE — Therapy (Signed)
[place], [place], [address] Phone: [phone]   Fax:  [phone]  Occupational Therapy Treatment  Patient Details  Name: [name] MRN: [id number] Date of Birth: [birth year] Referring Provider (OT): Dr. Arther Abbott   Encounter Date: 12/12/2020   OT End of Session - 12/12/20 0929    Visit Number 3    Number of Visits 12    Date for OT Re-Evaluation 01/09/21   mini reassess on 12/19/20   Authorization Type [employer] and [employer] - 40 visit    Authorization - Visit Number 3    Authorization - Number of Visits 30    Progress Note Due on Visit 10    OT Start Time [phone]    OT Stop Time 0929    OT Time Calculation (min) 38 min    Activity Tolerance Patient tolerated treatment well    Behavior During Therapy [facility] for tasks assessed/performed           Past Medical History:  Diagnosis Date  . Anxiety   . Cardiomyopathy   . Cervical high risk HPV (human papillomavirus) test positive 01/30/2015  . Chronic constipation   . Chronic lower back pain   . Constipation 01/30/2015  . Depression   . GERD (gastroesophageal reflux disease)   . Heart failure    Chronic combined  . HTN (hypertension)   . Hypercholesteremia   . Neurogenic bladder    2008 nerve damage s/p back ssurgery  . Type II diabetes mellitus ([place])     Past Surgical History:  Procedure Laterality Date  . ACHILLES TENDON SURGERY Right 06/2012  . BACK SURGERY  2005  . BI-VENTRICULAR IMPLANTABLE CARDIOVERTER DEFIBRILLATOR  (CRT-D)  12-18-2013   upgrade of previously implanted dual chamber pacemaker to MDT CRTD with RV lead revision by Dr Lovena Le  . BI-VENTRICULAR PACEMAKER UPGRADE  12/18/2013   w/defibrillator  . BIV PACEMAKER GENERATOR CHANGE OUT N/A 12/18/2013   Procedure: BIV PACEMAKER GENERATOR CHANGE OUT;  Surgeon: Evans Lance, MD;  Location: [facility] CATH LAB;  Service: Cardiovascular;  Laterality: N/A;  . BIV PACEMAKER INSERTION CRT-P N/A 02/22/2020    Procedure: DOWNGRADE BIV PACEMAKER INSERTION CRT-P;  Surgeon: Evans Lance, MD;  Location: [facility] CV LAB;  Service: Cardiovascular;  Laterality: N/A;  . BREAST BIOPSY Left 1973   benign  . BREAST LUMPECTOMY Left 1973  . CESAREAN SECTION  1981  . CHOLECYSTECTOMY    . GASTROCNEMIUS RECESSION  06/24/2012   Procedure: GASTROCNEMIUS SLIDE;  Surgeon: Colin Rhein, MD;  Location: [facility] ORS;  Service: Orthopedics;  Laterality: Right;  . HERNIA REPAIR    . LEAD REVISION N/A 12/18/2013   Procedure: LEAD REVISION;  Surgeon: Evans Lance, MD;  Location: [facility] CATH LAB;  Service: Cardiovascular;  Laterality: N/A;  . PACEMAKER INSERTION  2006   Dual chamber MDT pacemaker implanted - subsequent upgrade to CRTD 12-2013  . POSTERIOR LUMBAR FUSION  2006; 2008  . SIGMOIDOSCOPY  MAY 2011 w/ PROP   iTCS-->FSIG-poor bowel prep  . TUBAL LIGATION  ~ 1984  . UPPER GASTROINTESTINAL ENDOSCOPY  MAY 2011 w/ PROP   MILD GASTRITIS 2o ETOH    There were no vitals filed for this visit.   Subjective Assessment - 12/12/20 0849    Subjective  S: It's usually better in the morning than the rest of the day.    Currently in Pain? Yes    Pain Score 7     Pain  Location Shoulder    Pain Orientation Right    Pain Descriptors / Indicators Sharp    Pain Type Chronic pain    Pain Radiating Towards up the neck and down the arm    Pain Onset More than a month ago    Pain Frequency Constant    Aggravating Factors  movement    Pain Relieving Factors heating pad    Effect of Pain on Daily Activities moderate    Multiple Pain Sites No              [facility] OT Assessment - 12/12/20 0849      Assessment   Medical Diagnosis Right Shoulder Pain      Precautions   Precautions ICD/Pacemaker                    OT Treatments/Exercises (OP) - 12/12/20 0853      Exercises   Exercises Shoulder      Shoulder Exercises: Supine   Protraction PROM;5 reps;AROM;10 reps    Horizontal ABduction PROM;5  reps;AROM;10 reps    External Rotation PROM;5 reps;AROM;10 reps    Internal Rotation PROM;5 reps;AROM;10 reps    Flexion PROM;5 reps;AROM;10 reps    ABduction PROM;5 reps;AROM;10 reps      Shoulder Exercises: Standing   Protraction AROM;10 reps    Horizontal ABduction AROM;10 reps    External Rotation AROM;10 reps    Internal Rotation AROM;10 reps    Flexion AROM;10 reps    ABduction AROM;10 reps      Shoulder Exercises: Pulleys   Flexion 1 minute    ABduction 1 minute      Shoulder Exercises: ROM/Strengthening   Wall Wash 1'    Other ROM/Strengthening Exercises ball pass behind back using tennis ball for IR, 10X    Other ROM/Strengthening Exercises proximal shoulder strengthening on doorway using washcloth, 1' flexion      Manual Therapy   Manual Therapy Myofascial release    Manual therapy comments manual therapy completed seperately from all other interventions    Myofascial Release myofascial release and manual stretching to right upper arm, scapular and shoulder region to decrease pain and restrictions and improve pain free mobility in right shoulder region.                    OT Short Term Goals - 12/06/20 1536      OT SHORT TERM GOAL #1   Title Patient will be educated on a HEP for improved right shoulder mobility.    Time 3    Period Weeks    Status On-going    Target Date 12/19/20      OT SHORT TERM GOAL #2   Title Patient will decrease pain in her right shoulder to 6/10 or better during adl completion.    Time 3    Period Weeks    Status On-going             OT Long Term Goals - 12/06/20 1536      OT LONG TERM GOAL #1   Title Patient will return to prior level of function with all desired daily tasks.    Time 6    Period Weeks    Status On-going      OT LONG TERM GOAL #2   Title Patient will improve right shoulder a/rom to WNL for improved ability to reach overhead and behind her back.    Time 6    Period Weeks  Status On-going       OT LONG TERM GOAL #3   Title Patient will improbe right shoulder strength to 5/5 for improved ability to lift her husbands wheelchair and complete yard work.    Time 6    Period Weeks    Status On-going      OT LONG TERM GOAL #4   Title Patient will have 3/10 or less pain in her right shoulder region when completing adl tasks.    Time 6    Period Weeks    Status On-going                 Plan - 12/12/20 0917    Clinical Impression Statement A: Continued with myofascial release to address fascial restrictions. Pt demonstrates ROM WNL during passive stretching and A/ROM today. Progressed to all A/ROM, pt without increased pain, reports min/mod fatigue during tasks. Added proximal shoulder strengthening on doorway and ball pass for IR, continued with pulleys. Verbal cuing for form and technique.    Body Structure / Function / Physical Skills ADL;Strength;Pain;UE functional use;ROM;Muscle spasms;Fascial restriction    Plan P: Continue with manual therapy, passive stretching, and A/ROM. Add proximal shoulder strengthening in supine and standing, add scapular theraband. Update HEP for A/ROM    OT Home Exercise Plan eval: shoulder stretches and heat for pain relief ;3/25: shoulder A/AROM handout    Consulted and Agree with Plan of Care Patient           Patient will benefit from skilled therapeutic intervention in order to improve the following deficits and impairments:   Body Structure / Function / Physical Skills: ADL,Strength,Pain,UE functional use,ROM,Muscle spasms,Fascial restriction       Visit Diagnosis: Acute pain of right shoulder  Other symptoms and signs involving the musculoskeletal system  Stiffness of right shoulder, not elsewhere classified    Problem List Patient Active Problem List   Diagnosis Date Noted  . Cardiomyopathy ([place]) 11/14/2020  . Diabetic nephropathy ([place]) 11/14/2020  . Impaired mobility and activities of daily living 06/10/2020  . Alcohol  withdrawal syndrome, with delirium ([name]) 06/09/2020  . Chronic alcohol abuse 06/09/2020  . Facet arthropathy, lumbar 06/09/2020  . Sepsis ([place]) 06/09/2020  . Sinus tachycardia by electrocardiogram 06/09/2020  . Spondylolisthesis of lumbosacral region 06/09/2020  . Altered mental status 03/12/2020  . Stage 3b chronic kidney disease ([place]) 10/24/2019  . Urine leukocytes 09/24/2019  . Other microscopic hematuria 09/24/2019  . Postmenopause 02/08/2018  . Screening for colorectal cancer 02/08/2018  . Well woman exam with routine gynecological exam 02/08/2018  . Anemia in chronic kidney disease 12/15/2016  . Essential hypertension, benign 09/12/2015  . Cervical high risk HPV (human papillomavirus) test positive 01/30/2015  . Biventricular ICD (implantable cardioverter-defibrillator) in place 04/19/2014  . Chronic systolic heart failure ([place]) 12/18/2013  . Preop cardiovascular exam 11/17/2011  . Colon cancer screening 02/04/2011  . Uncontrolled type 2 diabetes mellitus with stage 3 chronic kidney disease, with long-term current use of insulin ([place]) 12/31/2009  . Mixed hyperlipidemia 12/31/2009  . ANXIETY DEPRESSION 12/31/2009  . GASTROESOPHAGEAL REFLUX DISEASE, CHRONIC 12/31/2009  . CONSTIPATION, CHRONIC 12/31/2009  . BACK PAIN, CHRONIC 12/31/2009  . Cerebral vascular accident ([place]) 05/22/2009  . 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 HYPERTENSION, HEART CONTROLLED W/O ASSOC CHF 11/27/2008  . COMBINED HEART FAILURE, CHRONIC 11/27/2008  . PACEMAKER, PERMANENT 11/27/2008   Guadelupe Sabin, OTR/L  [phone] 12/12/2020, 9:31 AM  [facility]  [place], [place], [address] Phone: [phone]   Fax:  [phone]  Name: KIMBELY WHITEAKER MRN: [id number] Date of Birth: Apr 03, 1951

## 2020-12-13 DIAGNOSIS — R809 Proteinuria, unspecified: Secondary | ICD-10-CM | POA: Diagnosis not present

## 2020-12-13 DIAGNOSIS — I5042 Chronic combined systolic (congestive) and diastolic (congestive) heart failure: Secondary | ICD-10-CM | POA: Diagnosis not present

## 2020-12-13 DIAGNOSIS — E1122 Type 2 diabetes mellitus with diabetic chronic kidney disease: Secondary | ICD-10-CM | POA: Diagnosis not present

## 2020-12-13 DIAGNOSIS — I129 Hypertensive chronic kidney disease with stage 1 through stage 4 chronic kidney disease, or unspecified chronic kidney disease: Secondary | ICD-10-CM | POA: Diagnosis not present

## 2020-12-13 DIAGNOSIS — N189 Chronic kidney disease, unspecified: Secondary | ICD-10-CM | POA: Diagnosis not present

## 2020-12-13 DIAGNOSIS — E1129 Type 2 diabetes mellitus with other diabetic kidney complication: Secondary | ICD-10-CM | POA: Diagnosis not present

## 2020-12-17 ENCOUNTER — Ambulatory Visit (HOSPITAL_COMMUNITY): Payer: Medicare Other | Admitting: Occupational Therapy

## 2020-12-20 ENCOUNTER — Encounter (HOSPITAL_COMMUNITY): Payer: Medicare Other

## 2020-12-23 DIAGNOSIS — E1122 Type 2 diabetes mellitus with diabetic chronic kidney disease: Secondary | ICD-10-CM | POA: Diagnosis not present

## 2020-12-23 DIAGNOSIS — I129 Hypertensive chronic kidney disease with stage 1 through stage 4 chronic kidney disease, or unspecified chronic kidney disease: Secondary | ICD-10-CM | POA: Diagnosis not present

## 2020-12-23 DIAGNOSIS — I5042 Chronic combined systolic (congestive) and diastolic (congestive) heart failure: Secondary | ICD-10-CM | POA: Diagnosis not present

## 2020-12-23 DIAGNOSIS — E1129 Type 2 diabetes mellitus with other diabetic kidney complication: Secondary | ICD-10-CM | POA: Diagnosis not present

## 2020-12-23 DIAGNOSIS — R809 Proteinuria, unspecified: Secondary | ICD-10-CM | POA: Diagnosis not present

## 2020-12-23 DIAGNOSIS — N189 Chronic kidney disease, unspecified: Secondary | ICD-10-CM | POA: Diagnosis not present

## 2020-12-24 ENCOUNTER — Ambulatory Visit (HOSPITAL_COMMUNITY): Payer: Medicare Other | Attending: Orthopedic Surgery | Admitting: Occupational Therapy

## 2020-12-24 ENCOUNTER — Telehealth (HOSPITAL_COMMUNITY): Payer: Self-pay | Admitting: Occupational Therapy

## 2020-12-24 NOTE — Telephone Encounter (Signed)
Called pt regarding no-shows for appts on 4/8 and 4/12. Left message informing pt of next scheduled appt on 12/26/20 at 11:15 am and requested pt call if unable or not planning to attend.    Guadelupe Sabin, OTR/L  [phone] 12/24/20

## 2020-12-26 ENCOUNTER — Ambulatory Visit (HOSPITAL_COMMUNITY): Payer: Medicare Other

## 2020-12-27 ENCOUNTER — Encounter (HOSPITAL_COMMUNITY): Payer: Medicare Other

## 2020-12-27 DIAGNOSIS — E1122 Type 2 diabetes mellitus with diabetic chronic kidney disease: Secondary | ICD-10-CM | POA: Diagnosis not present

## 2020-12-27 DIAGNOSIS — E1129 Type 2 diabetes mellitus with other diabetic kidney complication: Secondary | ICD-10-CM | POA: Diagnosis not present

## 2020-12-27 DIAGNOSIS — I5042 Chronic combined systolic (congestive) and diastolic (congestive) heart failure: Secondary | ICD-10-CM | POA: Diagnosis not present

## 2020-12-27 DIAGNOSIS — R809 Proteinuria, unspecified: Secondary | ICD-10-CM | POA: Diagnosis not present

## 2020-12-27 DIAGNOSIS — N189 Chronic kidney disease, unspecified: Secondary | ICD-10-CM | POA: Diagnosis not present

## 2020-12-27 DIAGNOSIS — I129 Hypertensive chronic kidney disease with stage 1 through stage 4 chronic kidney disease, or unspecified chronic kidney disease: Secondary | ICD-10-CM | POA: Diagnosis not present

## 2020-12-30 ENCOUNTER — Encounter (HOSPITAL_COMMUNITY): Payer: Self-pay

## 2020-12-30 NOTE — Therapy (Signed)
[place], [place], [address] Phone: [phone]   Fax:  [phone]  Patient Details  Name: [name] MRN: [id number] Date of Birth: 02-03-1951 Referring Provider:  No ref. provider found  Encounter Date: 12/30/2020  OCCUPATIONAL THERAPY DISCHARGE SUMMARY  Visits from Start of Care: 3  Pt called and requested to discharge from OT services.   Current functional level related to goals / functional outcomes:  OT LONG TERM GOAL #1   Title Patient will return to prior level of function with all desired daily tasks.    Time 6    Period Weeks    Status On-going        OT LONG TERM GOAL #2   Title Patient will improve right shoulder a/rom to WNL for improved ability to reach overhead and behind her back.    Time 6    Period Weeks    Status On-going        OT LONG TERM GOAL #3   Title Patient will improbe right shoulder strength to 5/5 for improved ability to lift her husbands wheelchair and complete yard work.    Time 6    Period Weeks    Status On-going        OT LONG TERM GOAL #4   Title Patient will have 3/10 or less pain in her right shoulder region when completing adl tasks.    Time 6    Period Weeks    Status On-going       Remaining deficits: All deficits remain from initial evaluation.   Education / Equipment: Pain management techniques including heat. HEP including shoulder stretches and AA/ROM exercises. Plan: Patient agrees to discharge.  Patient goals were not met. Patient is being discharged due to the patient's request.  ?????          Ailene Ravel, OTR/L,CBIS  [phone]  12/30/2020, 3:48 PM  [place] [address] [place], [place], [address] Phone: [phone]   Fax:  [phone]

## 2021-01-01 DIAGNOSIS — E0822 Diabetes mellitus due to underlying condition with diabetic chronic kidney disease: Secondary | ICD-10-CM | POA: Diagnosis not present

## 2021-01-01 DIAGNOSIS — R945 Abnormal results of liver function studies: Secondary | ICD-10-CM | POA: Diagnosis not present

## 2021-01-01 DIAGNOSIS — I130 Hypertensive heart and chronic kidney disease with heart failure and stage 1 through stage 4 chronic kidney disease, or unspecified chronic kidney disease: Secondary | ICD-10-CM | POA: Diagnosis not present

## 2021-01-01 DIAGNOSIS — E782 Mixed hyperlipidemia: Secondary | ICD-10-CM | POA: Diagnosis not present

## 2021-01-01 DIAGNOSIS — D631 Anemia in chronic kidney disease: Secondary | ICD-10-CM | POA: Diagnosis not present

## 2021-01-01 DIAGNOSIS — N1832 Chronic kidney disease, stage 3b: Secondary | ICD-10-CM | POA: Diagnosis not present

## 2021-01-01 DIAGNOSIS — N183 Chronic kidney disease, stage 3 unspecified: Secondary | ICD-10-CM | POA: Diagnosis not present

## 2021-01-01 DIAGNOSIS — I110 Hypertensive heart disease with heart failure: Secondary | ICD-10-CM | POA: Diagnosis not present

## 2021-01-01 DIAGNOSIS — E1122 Type 2 diabetes mellitus with diabetic chronic kidney disease: Secondary | ICD-10-CM | POA: Diagnosis not present

## 2021-01-06 DIAGNOSIS — F5101 Primary insomnia: Secondary | ICD-10-CM | POA: Diagnosis not present

## 2021-01-06 DIAGNOSIS — Z950 Presence of cardiac pacemaker: Secondary | ICD-10-CM | POA: Diagnosis not present

## 2021-01-06 DIAGNOSIS — I4891 Unspecified atrial fibrillation: Secondary | ICD-10-CM | POA: Diagnosis not present

## 2021-01-06 DIAGNOSIS — K219 Gastro-esophageal reflux disease without esophagitis: Secondary | ICD-10-CM | POA: Diagnosis not present

## 2021-01-06 DIAGNOSIS — D649 Anemia, unspecified: Secondary | ICD-10-CM | POA: Diagnosis not present

## 2021-01-06 DIAGNOSIS — E782 Mixed hyperlipidemia: Secondary | ICD-10-CM | POA: Diagnosis not present

## 2021-01-06 DIAGNOSIS — M79673 Pain in unspecified foot: Secondary | ICD-10-CM | POA: Diagnosis not present

## 2021-01-06 DIAGNOSIS — N1832 Chronic kidney disease, stage 3b: Secondary | ICD-10-CM | POA: Diagnosis not present

## 2021-01-06 DIAGNOSIS — M545 Low back pain, unspecified: Secondary | ICD-10-CM | POA: Diagnosis not present

## 2021-01-06 DIAGNOSIS — R945 Abnormal results of liver function studies: Secondary | ICD-10-CM | POA: Diagnosis not present

## 2021-01-06 DIAGNOSIS — E0822 Diabetes mellitus due to underlying condition with diabetic chronic kidney disease: Secondary | ICD-10-CM | POA: Diagnosis not present

## 2021-01-06 DIAGNOSIS — F411 Generalized anxiety disorder: Secondary | ICD-10-CM | POA: Diagnosis not present

## 2021-01-14 ENCOUNTER — Other Ambulatory Visit: Payer: Self-pay

## 2021-01-14 ENCOUNTER — Encounter: Payer: Self-pay | Admitting: Internal Medicine

## 2021-01-14 ENCOUNTER — Ambulatory Visit (INDEPENDENT_AMBULATORY_CARE_PROVIDER_SITE_OTHER): Payer: Medicare Other | Admitting: Internal Medicine

## 2021-01-14 VITALS — BP 118/68 | HR 90 | Ht 61.0 in | Wt 138.8 lb

## 2021-01-14 DIAGNOSIS — I5022 Chronic systolic (congestive) heart failure: Secondary | ICD-10-CM

## 2021-01-14 DIAGNOSIS — Z950 Presence of cardiac pacemaker: Secondary | ICD-10-CM

## 2021-01-14 DIAGNOSIS — I255 Ischemic cardiomyopathy: Secondary | ICD-10-CM
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
## 2021-01-14 NOTE — Patient Instructions (Signed)
Medication Instructions:  Your physician recommends that you continue on your current medications as directed. Please refer to the Current Medication list given to you today.  *If you need a refill on your cardiac medications before your next appointment, please call your pharmacy*   Lab Work: NONE   If you have labs (blood work) drawn today and your tests are completely normal, you will receive your results only by: . MyChart Message (if you have MyChart) OR . A paper copy in the mail If you have any lab test that is abnormal or we need to change your treatment, we will call you to review the results.   Testing/Procedures: NONE    Follow-Up: At [facility], you and your health needs are our priority.  As part of our continuing mission to provide you with exceptional heart care, we have created designated Provider Care Teams.  These Care Teams include your primary Cardiologist (physician) and Advanced Practice Providers (APPs -  Physician Assistants and Nurse Practitioners) who all work together to provide you with the care you need, when you need it.  We recommend signing up for the patient portal called "MyChart".  Sign up information is provided on this After Visit Summary.  MyChart is used to connect with patients for Virtual Visits (Telemedicine).  Patients are able to view lab/test results, encounter notes, upcoming appointments, etc.  Non-urgent messages can be sent to your provider as well.   To learn more about what you can do with MyChart, go to [email or web address].    Your next appointment:   1 year(s)  The format for your next appointment:   In Person  Provider:   Gregg Taylor, MD   Other Instructions Thank you for choosing [facility]!    

## 2021-02-19 DIAGNOSIS — E1129 Type 2 diabetes mellitus with other diabetic kidney complication: Secondary | ICD-10-CM | POA: Diagnosis not present

## 2021-02-19 DIAGNOSIS — I129 Hypertensive chronic kidney disease with stage 1 through stage 4 chronic kidney disease, or unspecified chronic kidney disease: Secondary | ICD-10-CM | POA: Diagnosis not present

## 2021-02-19 DIAGNOSIS — I5042 Chronic combined systolic (congestive) and diastolic (congestive) heart failure: Secondary | ICD-10-CM | POA: Diagnosis not present

## 2021-02-19 DIAGNOSIS — N189 Chronic kidney disease, unspecified: Secondary | ICD-10-CM | POA: Diagnosis not present

## 2021-02-19 DIAGNOSIS — R809 Proteinuria, unspecified: Secondary | ICD-10-CM | POA: Diagnosis not present

## 2021-02-19 DIAGNOSIS — E1122 Type 2 diabetes mellitus with diabetic chronic kidney disease: Secondary | ICD-10-CM | POA: Diagnosis not present

## 2021-02-21 ENCOUNTER — Encounter (INDEPENDENT_AMBULATORY_CARE_PROVIDER_SITE_OTHER): Payer: Medicare Other

## 2021-02-21 DIAGNOSIS — I255 Ischemic cardiomyopathy: Secondary | ICD-10-CM

## 2021-02-21 DIAGNOSIS — I5022 Chronic systolic (congestive) heart failure: Secondary | ICD-10-CM

## 2021-02-21 LAB — CUP PACEART REMOTE DEVICE CHECK
Battery Remaining Longevity: 100 mo
Battery Voltage: 3.01 V
Brady Statistic AP VP Percent: 0.01 %
Brady Statistic AP VS Percent: 0.02 %
Brady Statistic AS VP Percent: 98.43 %
Brady Statistic AS VS Percent: 1.55 %
Brady Statistic RA Percent Paced: 0.02 %
Brady Statistic RV Percent Paced: 23.8 %
Date Time Interrogation Session: 20220609225255
Implantable Lead Implant Date: 20060214
Implantable Lead Implant Date: 20150406
Implantable Lead Implant Date: 20150406
Implantable Lead Location: 753858
Implantable Lead Location: 753859
Implantable Lead Location: 753860
Implantable Lead Model: 4396
Implantable Lead Model: 5076
Implantable Pulse Generator Implant Date: 20210610
Lead Channel Impedance Value: 1292 Ohm
Lead Channel Impedance Value: 1406 Ohm
Lead Channel Impedance Value: 361 Ohm
Lead Channel Impedance Value: 437 Ohm
Lead Channel Impedance Value: 513 Ohm
Lead Channel Impedance Value: 551 Ohm
Lead Channel Impedance Value: 646 Ohm
Lead Channel Impedance Value: 836 Ohm
Lead Channel Impedance Value: 874 Ohm
Lead Channel Pacing Threshold Amplitude: 1.25 V
Lead Channel Pacing Threshold Amplitude: 1.375 V
Lead Channel Pacing Threshold Amplitude: 1.625 V
Lead Channel Pacing Threshold Pulse Width: 0.4 ms
Lead Channel Pacing Threshold Pulse Width: 0.4 ms
Lead Channel Pacing Threshold Pulse Width: 0.8 ms
Lead Channel Sensing Intrinsic Amplitude: 2.625 mV
Lead Channel Sensing Intrinsic Amplitude: 2.625 mV
Lead Channel Sensing Intrinsic Amplitude: 6 mV
Lead Channel Sensing Intrinsic Amplitude: 6 mV
Lead Channel Setting Pacing Amplitude: 2 V
Lead Channel Setting Pacing Amplitude: 2.25 V
Lead Channel Setting Pacing Amplitude: 2.75 V
Lead Channel Setting Pacing Pulse Width: 0.4 ms
Lead Channel Setting Pacing Pulse Width: 0.8 ms
Lead Channel Setting Sensing Sensitivity: 0.9 mV

## 2021-03-06 DIAGNOSIS — R309 Painful micturition, unspecified: Secondary | ICD-10-CM | POA: Diagnosis not present

## 2021-03-06 DIAGNOSIS — R109 Unspecified abdominal pain: Secondary | ICD-10-CM | POA: Diagnosis not present

## 2021-03-06 DIAGNOSIS — R350 Frequency of micturition: Secondary | ICD-10-CM | POA: Diagnosis not present

## 2021-03-11 NOTE — Progress Notes (Signed)
Remote pacemaker transmission.   

## 2021-03-14 DIAGNOSIS — R809 Proteinuria, unspecified: Secondary | ICD-10-CM | POA: Diagnosis not present

## 2021-03-14 DIAGNOSIS — I5042 Chronic combined systolic (congestive) and diastolic (congestive) heart failure: Secondary | ICD-10-CM | POA: Diagnosis not present

## 2021-03-14 DIAGNOSIS — I129 Hypertensive chronic kidney disease with stage 1 through stage 4 chronic kidney disease, or unspecified chronic kidney disease: Secondary | ICD-10-CM | POA: Diagnosis not present

## 2021-03-14 DIAGNOSIS — N189 Chronic kidney disease, unspecified: Secondary | ICD-10-CM | POA: Diagnosis not present

## 2021-03-14 DIAGNOSIS — E875 Hyperkalemia: Secondary | ICD-10-CM | POA: Diagnosis not present

## 2021-03-14 DIAGNOSIS — E1122 Type 2 diabetes mellitus with diabetic chronic kidney disease: Secondary | ICD-10-CM | POA: Diagnosis not present

## 2021-03-14 DIAGNOSIS — E1129 Type 2 diabetes mellitus with other diabetic kidney complication: Secondary | ICD-10-CM | POA: Diagnosis not present

## 2021-03-24 DIAGNOSIS — E875 Hyperkalemia: Secondary | ICD-10-CM | POA: Diagnosis not present

## 2021-03-24 DIAGNOSIS — N189 Chronic kidney disease, unspecified: Secondary | ICD-10-CM | POA: Diagnosis not present

## 2021-03-24 DIAGNOSIS — E1129 Type 2 diabetes mellitus with other diabetic kidney complication: Secondary | ICD-10-CM | POA: Diagnosis not present

## 2021-03-24 DIAGNOSIS — I129 Hypertensive chronic kidney disease with stage 1 through stage 4 chronic kidney disease, or unspecified chronic kidney disease: Secondary | ICD-10-CM | POA: Diagnosis not present

## 2021-03-24 DIAGNOSIS — I5042 Chronic combined systolic (congestive) and diastolic (congestive) heart failure: Secondary | ICD-10-CM | POA: Diagnosis not present

## 2021-03-24 DIAGNOSIS — E1122 Type 2 diabetes mellitus with diabetic chronic kidney disease: Secondary | ICD-10-CM | POA: Diagnosis not present

## 2021-03-24 DIAGNOSIS — R809 Proteinuria, unspecified: Secondary | ICD-10-CM | POA: Diagnosis not present

## 2021-04-14 ENCOUNTER — Other Ambulatory Visit: Payer: Self-pay | Admitting: Cardiology

## 2021-04-17 DIAGNOSIS — E109 Type 1 diabetes mellitus without complications: Secondary | ICD-10-CM | POA: Diagnosis not present

## 2021-04-23 DIAGNOSIS — E1140 Type 2 diabetes mellitus with diabetic neuropathy, unspecified: Secondary | ICD-10-CM | POA: Diagnosis not present

## 2021-04-23 DIAGNOSIS — E0821 Diabetes mellitus due to underlying condition with diabetic nephropathy: Secondary | ICD-10-CM | POA: Diagnosis not present

## 2021-04-23 DIAGNOSIS — E785 Hyperlipidemia, unspecified: Secondary | ICD-10-CM | POA: Diagnosis not present

## 2021-04-23 DIAGNOSIS — E139 Other specified diabetes mellitus without complications: Secondary | ICD-10-CM | POA: Diagnosis not present

## 2021-04-23 DIAGNOSIS — R945 Abnormal results of liver function studies: Secondary | ICD-10-CM | POA: Diagnosis not present

## 2021-04-23 DIAGNOSIS — Z6823 Body mass index (BMI) 23.0-23.9, adult: Secondary | ICD-10-CM | POA: Diagnosis not present

## 2021-04-23 DIAGNOSIS — I429 Cardiomyopathy, unspecified: Secondary | ICD-10-CM | POA: Diagnosis not present

## 2021-04-23 DIAGNOSIS — E109 Type 1 diabetes mellitus without complications: Secondary | ICD-10-CM | POA: Diagnosis not present

## 2021-04-23 DIAGNOSIS — N184 Chronic kidney disease, stage 4 (severe): Secondary | ICD-10-CM | POA: Diagnosis not present

## 2021-05-15 DIAGNOSIS — I129 Hypertensive chronic kidney disease with stage 1 through stage 4 chronic kidney disease, or unspecified chronic kidney disease: Secondary | ICD-10-CM | POA: Diagnosis not present

## 2021-05-15 DIAGNOSIS — R809 Proteinuria, unspecified: Secondary | ICD-10-CM | POA: Diagnosis not present

## 2021-05-15 DIAGNOSIS — I5042 Chronic combined systolic (congestive) and diastolic (congestive) heart failure: Secondary | ICD-10-CM | POA: Diagnosis not present

## 2021-05-15 DIAGNOSIS — E875 Hyperkalemia: Secondary | ICD-10-CM | POA: Diagnosis not present

## 2021-05-15 DIAGNOSIS — N189 Chronic kidney disease, unspecified: Secondary | ICD-10-CM | POA: Diagnosis not present

## 2021-05-15 DIAGNOSIS — E1122 Type 2 diabetes mellitus with diabetic chronic kidney disease: Secondary | ICD-10-CM | POA: Diagnosis not present

## 2021-05-15 DIAGNOSIS — E1129 Type 2 diabetes mellitus with other diabetic kidney complication: Secondary | ICD-10-CM | POA: Diagnosis not present

## 2021-05-23 ENCOUNTER — Encounter (INDEPENDENT_AMBULATORY_CARE_PROVIDER_SITE_OTHER): Payer: Medicare Other

## 2021-05-23 DIAGNOSIS — I255 Ischemic cardiomyopathy: Secondary | ICD-10-CM | POA: Diagnosis not present

## 2021-05-23 DIAGNOSIS — I5022 Chronic systolic (congestive) heart failure: Secondary | ICD-10-CM

## 2021-05-23 LAB — CUP PACEART REMOTE DEVICE CHECK
Battery Remaining Longevity: 89 mo
Battery Voltage: 3 V
Brady Statistic AP VP Percent: 0.01 %
Brady Statistic AP VS Percent: 0.01 %
Brady Statistic AS VP Percent: 98.43 %
Brady Statistic AS VS Percent: 1.55 %
Brady Statistic RA Percent Paced: 0.02 %
Brady Statistic RV Percent Paced: 28.38 %
Date Time Interrogation Session: 20220908202826
Implantable Lead Implant Date: 20060214
Implantable Lead Implant Date: 20150406
Implantable Lead Implant Date: 20150406
Implantable Lead Location: 753858
Implantable Lead Location: 753859
Implantable Lead Location: 753860
Implantable Lead Model: 4396
Implantable Lead Model: 5076
Implantable Pulse Generator Implant Date: 20210610
Lead Channel Impedance Value: 1349 Ohm
Lead Channel Impedance Value: 1463 Ohm
Lead Channel Impedance Value: 361 Ohm
Lead Channel Impedance Value: 437 Ohm
Lead Channel Impedance Value: 475 Ohm
Lead Channel Impedance Value: 551 Ohm
Lead Channel Impedance Value: 608 Ohm
Lead Channel Impedance Value: 836 Ohm
Lead Channel Impedance Value: 855 Ohm
Lead Channel Pacing Threshold Amplitude: 1.25 V
Lead Channel Pacing Threshold Amplitude: 1.25 V
Lead Channel Pacing Threshold Amplitude: 2 V
Lead Channel Pacing Threshold Pulse Width: 0.4 ms
Lead Channel Pacing Threshold Pulse Width: 0.4 ms
Lead Channel Pacing Threshold Pulse Width: 0.8 ms
Lead Channel Sensing Intrinsic Amplitude: 10.875 mV
Lead Channel Sensing Intrinsic Amplitude: 10.875 mV
Lead Channel Sensing Intrinsic Amplitude: 3.75 mV
Lead Channel Sensing Intrinsic Amplitude: 3.75 mV
Lead Channel Setting Pacing Amplitude: 2 V
Lead Channel Setting Pacing Amplitude: 2.5 V
Lead Channel Setting Pacing Amplitude: 2.5 V
Lead Channel Setting Pacing Pulse Width: 0.4 ms
Lead Channel Setting Pacing Pulse Width: 0.8 ms
Lead Channel Setting Sensing Sensitivity: 0.9 mV

## 2021-05-26 DIAGNOSIS — R350 Frequency of micturition: Secondary | ICD-10-CM | POA: Diagnosis not present

## 2021-05-26 DIAGNOSIS — N390 Urinary tract infection, site not specified: Secondary | ICD-10-CM | POA: Diagnosis not present

## 2021-05-27 DIAGNOSIS — E1122 Type 2 diabetes mellitus with diabetic chronic kidney disease: Secondary | ICD-10-CM | POA: Diagnosis not present

## 2021-05-27 DIAGNOSIS — E875 Hyperkalemia: Secondary | ICD-10-CM | POA: Diagnosis not present

## 2021-05-27 DIAGNOSIS — E1129 Type 2 diabetes mellitus with other diabetic kidney complication: Secondary | ICD-10-CM | POA: Diagnosis not present

## 2021-05-27 DIAGNOSIS — N19 Unspecified kidney failure: Secondary | ICD-10-CM | POA: Diagnosis not present

## 2021-05-27 DIAGNOSIS — N189 Chronic kidney disease, unspecified: Secondary | ICD-10-CM | POA: Diagnosis not present

## 2021-05-27 DIAGNOSIS — I5042 Chronic combined systolic (congestive) and diastolic (congestive) heart failure: Secondary | ICD-10-CM | POA: Diagnosis not present

## 2021-05-27 DIAGNOSIS — I129 Hypertensive chronic kidney disease with stage 1 through stage 4 chronic kidney disease, or unspecified chronic kidney disease: Secondary | ICD-10-CM | POA: Diagnosis not present

## 2021-05-27 DIAGNOSIS — R809 Proteinuria, unspecified: Secondary | ICD-10-CM | POA: Diagnosis not present

## 2021-05-27 NOTE — Progress Notes (Signed)
Remote pacemaker transmission.   

## 2021-05-29 DIAGNOSIS — R2231 Localized swelling, mass and lump, right upper limb: Secondary | ICD-10-CM | POA: Diagnosis not present

## 2021-06-04 DIAGNOSIS — E1165 Type 2 diabetes mellitus with hyperglycemia: Secondary | ICD-10-CM | POA: Diagnosis not present

## 2021-06-04 DIAGNOSIS — R82998 Other abnormal findings in urine: Secondary | ICD-10-CM | POA: Diagnosis not present

## 2021-06-11 DIAGNOSIS — R945 Abnormal results of liver function studies: Secondary | ICD-10-CM | POA: Diagnosis not present

## 2021-06-11 DIAGNOSIS — R109 Unspecified abdominal pain: Secondary | ICD-10-CM | POA: Diagnosis not present

## 2021-06-11 DIAGNOSIS — F411 Generalized anxiety disorder: Secondary | ICD-10-CM | POA: Diagnosis not present

## 2021-06-11 DIAGNOSIS — I4891 Unspecified atrial fibrillation: Secondary | ICD-10-CM | POA: Diagnosis not present

## 2021-06-11 DIAGNOSIS — D649 Anemia, unspecified: Secondary | ICD-10-CM | POA: Diagnosis not present

## 2021-06-11 DIAGNOSIS — E1122 Type 2 diabetes mellitus with diabetic chronic kidney disease: Secondary | ICD-10-CM | POA: Diagnosis not present

## 2021-06-11 DIAGNOSIS — M79673 Pain in unspecified foot: Secondary | ICD-10-CM | POA: Diagnosis not present

## 2021-06-11 DIAGNOSIS — I10 Essential (primary) hypertension: Secondary | ICD-10-CM | POA: Diagnosis not present

## 2021-06-11 DIAGNOSIS — E782 Mixed hyperlipidemia: Secondary | ICD-10-CM | POA: Diagnosis not present

## 2021-06-11 DIAGNOSIS — M545 Low back pain, unspecified: Secondary | ICD-10-CM | POA: Diagnosis not present

## 2021-06-11 DIAGNOSIS — Z23 Encounter for immunization: Secondary | ICD-10-CM | POA: Diagnosis not present

## 2021-06-11 DIAGNOSIS — K219 Gastro-esophageal reflux disease without esophagitis: Secondary | ICD-10-CM | POA: Diagnosis not present

## 2021-07-03 ENCOUNTER — Other Ambulatory Visit: Payer: Self-pay

## 2021-07-03 ENCOUNTER — Ambulatory Visit
Admission: EM | Admit: 2021-07-03 | Discharge: 2021-07-03 | Disposition: A | Discharge status: Home / Self Care | Payer: Medicare Other | Attending: Emergency Medicine | Admitting: Emergency Medicine

## 2021-07-03 DIAGNOSIS — J069 Acute upper respiratory infection, unspecified: Secondary | ICD-10-CM

## 2021-07-03 DIAGNOSIS — U071 COVID-19: Secondary | ICD-10-CM

## 2021-07-03 DIAGNOSIS — Z1152 Encounter for screening for COVID-19: Secondary | ICD-10-CM

## 2021-07-03 LAB — POCT INFLUENZA A/B
Influenza A, POC: NEGATIVE
Influenza B, POC: NEGATIVE

## 2021-07-03 MED ORDER — ONDANSETRON 4 MG PO TBDP: 4.0000 mg | ORAL_TABLET | Freq: Three times a day (TID) | ORAL | 0 refills | Status: DC | PRN

## 2021-07-03 MED ORDER — FLUTICASONE PROPIONATE 50 MCG/ACT NA SUSP: 2.0000 | Freq: Every day | NASAL | 0 refills | Status: DC

## 2021-07-03 MED ORDER — BENZONATATE 200 MG PO CAPS: 200.0000 mg | ORAL_CAPSULE | Freq: Three times a day (TID) | ORAL | 0 refills | Status: DC | PRN

## 2021-07-03 NOTE — Discharge Instructions (Addendum)
I will contact you only if your flu comes back positive.  I will prescribe Tamiflu in that case.  Otherwise, your flu and COVID will be back in a day or 2.  I will prescribe Tamiflu if it is positive for Molnupiravir if your COVID is positive.  In the meantime, 1000 mg of Tylenol 3-4 times a day as needed for body aches, headaches, Flonase, saline nasal irrigation with a Milta Deiters Med rinse with distilled water as often as you want, Zofran as needed for nausea and vomiting and Tessalon for the cough.

## 2021-07-03 NOTE — ED Provider Notes (Addendum)
HPI  SUBJECTIVE:  [name] is a 70 y.o. female who presents with the acute onset of body aches, and episode of nonbilious, nonbloody vomiting x1, sore throat, chills, headache, nasal congestion, rhinorrhea, postnasal drip, cough, nausea.  No fevers, loss of sense of smell or taste, wheezing, shortness of breath, diarrhea, abdominal pain.  She is tolerating p.o. now.  Symptoms started yesterday.  No known COVID or flu exposure  She got the second dose of the COVID-vaccine.  She also got the flu vaccine.  No antipyretic in the past 6 hours.  No aggravating or alleviating factors.  She has not tried anything for this.  She has a past medical history of CVA, diabetes, hypertension, chronic kidney disease stage III, and is status post pacemaker for an arrhythmia.  No history of pulmonary disease, smoking, COVID.  GMW:NUUV, Edwinna Areola, MD  Past Medical History:  Diagnosis Date   Anxiety    Cardiomyopathy    Cervical high risk HPV (human papillomavirus) test positive 01/30/2015   Chronic constipation    Chronic lower back pain    Constipation 01/30/2015   Depression    GERD (gastroesophageal reflux disease)    Heart failure    Chronic combined   HTN (hypertension)    Hypercholesteremia    Neurogenic bladder    2008 nerve damage s/p back ssurgery   Type II diabetes mellitus [facility])     Past Surgical History:  Procedure Laterality Date   ACHILLES TENDON SURGERY Right 06/2012   BACK SURGERY  2005   BI-VENTRICULAR IMPLANTABLE CARDIOVERTER DEFIBRILLATOR  (CRT-D)  12-18-2013   upgrade of previously implanted dual chamber pacemaker to MDT CRTD with RV lead revision by Dr Placido Sou PACEMAKER UPGRADE  12/18/2013   w/defibrillator   BIV PACEMAKER GENERATOR CHANGE OUT N/A 12/18/2013   Procedure: BIV PACEMAKER GENERATOR CHANGE OUT;  Surgeon: Evans Lance, MD;  Location: [facility] CATH LAB;  Service: Cardiovascular;  Laterality: N/A;   BIV PACEMAKER INSERTION CRT-P N/A 02/22/2020   Procedure:  DOWNGRADE BIV PACEMAKER INSERTION CRT-P;  Surgeon: Evans Lance, MD;  Location: [facility] CV LAB;  Service: Cardiovascular;  Laterality: N/A;   BREAST BIOPSY Left 1973   benign   BREAST LUMPECTOMY Left [place]   CHOLECYSTECTOMY     GASTROCNEMIUS RECESSION  06/24/2012   Procedure: GASTROCNEMIUS SLIDE;  Surgeon: Colin Rhein, MD;  Location: [facility] ORS;  Service: Orthopedics;  Laterality: Right;   HERNIA REPAIR     LEAD REVISION N/A 12/18/2013   Procedure: LEAD REVISION;  Surgeon: Evans Lance, MD;  Location: [facility] CATH LAB;  Service: Cardiovascular;  Laterality: N/A;   PACEMAKER INSERTION  2006   Dual chamber MDT pacemaker implanted - subsequent upgrade to CRTD 12-2013   POSTERIOR LUMBAR FUSION  2006; 2008   SIGMOIDOSCOPY  MAY 2011 w/ PROP   iTCS-->FSIG-poor bowel prep   TUBAL LIGATION  ~ 1984   UPPER GASTROINTESTINAL ENDOSCOPY  MAY 2011 w/ PROP   MILD GASTRITIS 2o ETOH    Family History  Problem Relation Age of Onset   Alzheimer's disease Mother    Diabetes Mother    Cancer Father    Cancer Sister        breast   Diabetes Sister    Diabetes Brother    Brain cancer Brother    Diabetes Brother    Colon cancer Neg Hx    Colon polyps Neg Hx     Social History  Tobacco Use   Smoking status: Never   Smokeless tobacco: Never  Vaping Use   Vaping Use: Never used  Substance Use Topics   Alcohol use: Yes    Alcohol/week: 0.0 standard drinks    Comment: drinks beer or wine three times a week   Drug use: No    No current facility-administered medications for this encounter.  Current Outpatient Medications:    benzonatate (TESSALON) 200 MG capsule, Take 1 capsule (200 mg total) by mouth 3 (three) times daily as needed for cough., Disp: 30 capsule, Rfl: 0   fluticasone (FLONASE) 50 MCG/ACT nasal spray, Place 2 sprays into both nostrils daily., Disp: 16 g, Rfl: 0   ondansetron (ZOFRAN ODT) 4 MG disintegrating tablet, Take 1 tablet (4 mg total) by mouth  every 8 (eight) hours as needed for nausea or vomiting., Disp: 20 tablet, Rfl: 0   Artificial Tear Solution (SYSTANE CONTACTS OP), Place 1 drop into both eyes 2 (two) times daily., Disp: , Rfl:    aspirin EC 81 MG tablet, Take 81 mg by mouth daily., Disp: , Rfl:    buPROPion (WELLBUTRIN XL) 300 MG 24 hr tablet, Take 300 mg by mouth daily., Disp: , Rfl:    carvedilol (COREG) 6.25 MG tablet, TAKE ONE TABLET BY MOUTH TWICE A DAY WITH A MEAL, Disp: 60 tablet, Rfl: 6   clonazePAM (KLONOPIN) 1 MG tablet, Take 1 mg by mouth 2 (two) times daily as needed., Disp: , Rfl:    furosemide (LASIX) 40 MG tablet, Take 1 tablet (40 mg total) by mouth daily., Disp: 90 tablet, Rfl: 3   gabapentin (NEURONTIN) 600 MG tablet, Take 600 mg by mouth at bedtime. , Disp: , Rfl:    HYDROcodone-acetaminophen (NORCO) 10-325 MG tablet, Take 1-2 tablets by mouth every 6 (six) hours as needed for moderate pain., Disp: , Rfl:    insulin lispro (HUMALOG KWIKPEN) 100 UNIT/ML KwikPen, INJECT UP TO 50 UNITS DAILY (Patient taking differently: 5 Units. INJECT UP TO 50 UNITS DAILY - sliding scale), Disp: 15 mL, Rfl: 2   LANTUS SOLOSTAR 100 UNIT/ML Solostar Pen, INJECT 50 UNITS INTO THE SKIN DAILY AT 10:00PM (Patient taking differently: 10 Units at bedtime.), Disp: 15 mL, Rfl: 2   losartan (COZAAR) 25 MG tablet, Take 12.5 mg by mouth daily., Disp: , Rfl:    omeprazole (PRILOSEC) 20 MG capsule, Take 20 mg by mouth daily., Disp: , Rfl:    potassium chloride SA (K-DUR,KLOR-CON) 20 MEQ tablet, Take 20 mEq by mouth 2 (two) times daily., Disp: , Rfl:    rosuvastatin (CRESTOR) 40 MG tablet, TAKE ONE (1) TABLET BY MOUTH EVERY DAY (STOP ATORVASTATIN), Disp: 90 tablet, Rfl: 0   sertraline (ZOLOFT) 100 MG tablet, Take 200 mg by mouth daily., Disp: , Rfl:    zolpidem (AMBIEN) 10 MG tablet, Take 10 mg by mouth at bedtime as needed for sleep., Disp: , Rfl:   No Known Allergies   ROS  As noted in HPI.   Physical Exam  BP 138/84 (BP Location:  Right Arm)   Pulse (S) (!) 116 Comment: anxiety just took her anxiety med about 10am  Temp 99.2 F (37.3 C) (Temporal)   Resp 16   SpO2 98%   Constitutional: Well developed, well nourished, no acute distress Eyes: PERRL, EOMI, conjunctiva normal bilaterally HENT: Normocephalic, atraumatic,mucus membranes moist.  Positive nasal congestion.  Very mild maxillary sinus tenderness.  Normal oropharynx, normal tonsils without exudates.  No postnasal drip. Neck: No cervical lymphadenopathy Respiratory:  Clear to auscultation bilaterally, no rales, no wheezing, no rhonchi Cardiovascular: Regular tachycardia, no murmurs, no gallops, no rubs GI: Nondistended skin: No rash, skin intact Musculoskeletal: No edema, no tenderness, no deformities Neurologic: Alert & oriented x 3, CN III-XII grossly intact, no motor deficits, sensation grossly intact Psychiatric: Speech and behavior appropriate   ED Course   Medications - No data to display  Orders Placed This Encounter  Procedures   Covid-19, Flu A+B ([facility])    Standing Status:   Standing    Number of Occurrences:   1   POCT Influenza A/B    Standing Status:   Standing    Number of Occurrences:   1   No results found for this or any previous visit (from the past 24 hour(s)).  No results found.   Results for orders placed or performed during the hospital encounter of 07/03/21  Covid-19, Flu A+B ([facility])   Specimen: Nasopharyngeal   Naso  Result Value Ref Range   SARS-CoV-2, NAA Detected (A) Not Detected   Influenza A, NAA Not Detected Not Detected   Influenza B, NAA Not Detected Not Detected   Test Information: Comment   POCT Influenza A/B  Result Value Ref Range   Influenza A, POC Negative Negative   Influenza B, POC Negative Negative     ED Clinical Impression  1. COVID-19 virus infection   2. Encounter for screening for COVID-19   3. Acute upper respiratory infection      ED Assessment/Plan  Will check a rapid flu.   Contacting patient at [phone]  only if positive. will call in Tamiflu.  If negative, will send out for COVID and flu PCR.  She is a candidate for antivirals if her COVID is positive.  Plan to prescribe Molnupiravir tdue the history of chronic kidney disease and other comorbidities.  In the meantime, Tylenol, Flonase, saline nasal irrigation, Zofran, Tessalon.  Push fluids.  Rapid flu negative.  Plan as above.  COVID, flu PCR pending at the time of signing of this note.  Labs reviewed. Covid positive. Will rx molnupiravir to pharmacy on record. See telephone note. Personally called pt and notified her of lab result and prescription waiting for her. Discussed further treatment plan.   Discussed labs,  MDM, treatment plan, and plan for follow-up with patient patient agrees with plan.   Meds ordered this encounter  Medications   benzonatate (TESSALON) 200 MG capsule    Sig: Take 1 capsule (200 mg total) by mouth 3 (three) times daily as needed for cough.    Dispense:  30 capsule    Refill:  0   fluticasone (FLONASE) 50 MCG/ACT nasal spray    Sig: Place 2 sprays into both nostrils daily.    Dispense:  16 g    Refill:  0   ondansetron (ZOFRAN ODT) 4 MG disintegrating tablet    Sig: Take 1 tablet (4 mg total) by mouth every 8 (eight) hours as needed for nausea or vomiting.    Dispense:  20 tablet    Refill:  0      *This clinic note was created using [employer]. Therefore, there may be occasional mistakes despite careful proofreading. ?    Melynda Ripple, MD 07/04/21 [id number]    Melynda Ripple, MD 07/04/21 2003

## 2021-07-03 NOTE — ED Triage Notes (Signed)
Patient presents to Urgent Care with complaints of headache, chills, sore throat, and vomiting since last night.   Denies fever or abdominal pain.

## 2021-07-04 ENCOUNTER — Telehealth (HOSPITAL_COMMUNITY): Payer: Self-pay | Admitting: Emergency Medicine

## 2021-07-04 DIAGNOSIS — U071 COVID-19: Secondary | ICD-10-CM

## 2021-07-04 LAB — COVID-19, FLU A+B NAA
Influenza A, NAA: NOT DETECTED
Influenza B, NAA: NOT DETECTED
SARS-CoV-2, NAA: DETECTED — AB

## 2021-07-04 MED ORDER — MOLNUPIRAVIR EUA 200MG CAPSULE: 4.0000 | ORAL_CAPSULE | Freq: Two times a day (BID) | ORAL | 0 refills | Status: AC

## 2021-07-04 NOTE — Telephone Encounter (Signed)
COVID positive. Rx'ing molnupiravir to [facility]. Called pt and discussed positive result, prescription waiting for her and treatment plan.   Results for orders placed or performed during the hospital encounter of 07/03/21  Covid-19, Flu A+B ([facility])   Specimen: Nasopharyngeal   Naso  Result Value Ref Range   SARS-CoV-2, NAA Detected (A) Not Detected   Influenza A, NAA Not Detected Not Detected   Influenza B, NAA Not Detected Not Detected   Test Information: Comment   POCT Influenza A/B  Result Value Ref Range   Influenza A, POC Negative Negative   Influenza B, POC Negative Negative

## 2021-07-27 DIAGNOSIS — Z20828 Contact with and (suspected) exposure to other viral communicable diseases: Secondary | ICD-10-CM | POA: Diagnosis not present

## 2021-08-17 DIAGNOSIS — Z20822 Contact with and (suspected) exposure to covid-19: Secondary | ICD-10-CM | POA: Diagnosis not present

## 2021-08-18 DIAGNOSIS — Z20822 Contact with and (suspected) exposure to covid-19: Secondary | ICD-10-CM | POA: Diagnosis not present

## 2021-08-22 ENCOUNTER — Encounter (INDEPENDENT_AMBULATORY_CARE_PROVIDER_SITE_OTHER): Payer: Medicare Other

## 2021-08-22 DIAGNOSIS — N189 Chronic kidney disease, unspecified: Secondary | ICD-10-CM | POA: Diagnosis not present

## 2021-08-22 DIAGNOSIS — E1122 Type 2 diabetes mellitus with diabetic chronic kidney disease: Secondary | ICD-10-CM | POA: Diagnosis not present

## 2021-08-22 DIAGNOSIS — I255 Ischemic cardiomyopathy: Secondary | ICD-10-CM

## 2021-08-22 DIAGNOSIS — I129 Hypertensive chronic kidney disease with stage 1 through stage 4 chronic kidney disease, or unspecified chronic kidney disease: Secondary | ICD-10-CM | POA: Diagnosis not present

## 2021-08-22 DIAGNOSIS — E1129 Type 2 diabetes mellitus with other diabetic kidney complication: Secondary | ICD-10-CM | POA: Diagnosis not present

## 2021-08-22 DIAGNOSIS — N19 Unspecified kidney failure: Secondary | ICD-10-CM | POA: Diagnosis not present

## 2021-08-22 DIAGNOSIS — I5042 Chronic combined systolic (congestive) and diastolic (congestive) heart failure: Secondary | ICD-10-CM | POA: Diagnosis not present

## 2021-08-22 DIAGNOSIS — E875 Hyperkalemia: Secondary | ICD-10-CM | POA: Diagnosis not present

## 2021-08-22 DIAGNOSIS — R809 Proteinuria, unspecified: Secondary | ICD-10-CM | POA: Diagnosis not present

## 2021-08-22 LAB — CUP PACEART REMOTE DEVICE CHECK
Battery Remaining Longevity: 87 mo
Battery Voltage: 2.99 V
Brady Statistic AP VP Percent: 0.01 %
Brady Statistic AP VS Percent: 0.01 %
Brady Statistic AS VP Percent: 98.39 %
Brady Statistic AS VS Percent: 1.59 %
Brady Statistic RA Percent Paced: 0.03 %
Brady Statistic RV Percent Paced: 26.92 %
Date Time Interrogation Session: 20221209005818
Implantable Lead Implant Date: 20060214
Implantable Lead Implant Date: 20150406
Implantable Lead Implant Date: 20150406
Implantable Lead Location: 753858
Implantable Lead Location: 753859
Implantable Lead Location: 753860
Implantable Lead Model: 4396
Implantable Lead Model: 5076
Implantable Pulse Generator Implant Date: 20210610
Lead Channel Impedance Value: 1292 Ohm
Lead Channel Impedance Value: 1387 Ohm
Lead Channel Impedance Value: 361 Ohm
Lead Channel Impedance Value: 418 Ohm
Lead Channel Impedance Value: 494 Ohm
Lead Channel Impedance Value: 551 Ohm
Lead Channel Impedance Value: 627 Ohm
Lead Channel Impedance Value: 836 Ohm
Lead Channel Impedance Value: 855 Ohm
Lead Channel Pacing Threshold Amplitude: 1.25 V
Lead Channel Pacing Threshold Amplitude: 1.375 V
Lead Channel Pacing Threshold Amplitude: 1.875 V
Lead Channel Pacing Threshold Pulse Width: 0.4 ms
Lead Channel Pacing Threshold Pulse Width: 0.4 ms
Lead Channel Pacing Threshold Pulse Width: 0.8 ms
Lead Channel Sensing Intrinsic Amplitude: 2.625 mV
Lead Channel Sensing Intrinsic Amplitude: 2.625 mV
Lead Channel Sensing Intrinsic Amplitude: 8 mV
Lead Channel Sensing Intrinsic Amplitude: 8 mV
Lead Channel Setting Pacing Amplitude: 2 V
Lead Channel Setting Pacing Amplitude: 2.5 V
Lead Channel Setting Pacing Amplitude: 2.75 V
Lead Channel Setting Pacing Pulse Width: 0.4 ms
Lead Channel Setting Pacing Pulse Width: 0.8 ms
Lead Channel Setting Sensing Sensitivity: 0.9 mV

## 2021-08-28 DIAGNOSIS — I5042 Chronic combined systolic (congestive) and diastolic (congestive) heart failure: Secondary | ICD-10-CM | POA: Diagnosis not present

## 2021-08-28 DIAGNOSIS — R809 Proteinuria, unspecified: Secondary | ICD-10-CM | POA: Diagnosis not present

## 2021-08-28 DIAGNOSIS — N189 Chronic kidney disease, unspecified: Secondary | ICD-10-CM | POA: Diagnosis not present

## 2021-08-28 DIAGNOSIS — I129 Hypertensive chronic kidney disease with stage 1 through stage 4 chronic kidney disease, or unspecified chronic kidney disease: Secondary | ICD-10-CM | POA: Diagnosis not present

## 2021-08-28 DIAGNOSIS — E1129 Type 2 diabetes mellitus with other diabetic kidney complication: Secondary | ICD-10-CM | POA: Diagnosis not present

## 2021-08-28 DIAGNOSIS — N19 Unspecified kidney failure: Secondary | ICD-10-CM | POA: Diagnosis not present

## 2021-08-28 DIAGNOSIS — E1122 Type 2 diabetes mellitus with diabetic chronic kidney disease: Secondary | ICD-10-CM | POA: Diagnosis not present

## 2021-09-01 NOTE — Progress Notes (Signed)
Remote pacemaker transmission.   

## 2021-09-09 DIAGNOSIS — F411 Generalized anxiety disorder: Secondary | ICD-10-CM | POA: Diagnosis not present

## 2021-09-09 DIAGNOSIS — E663 Overweight: Secondary | ICD-10-CM | POA: Diagnosis not present

## 2021-09-09 DIAGNOSIS — N390 Urinary tract infection, site not specified: Secondary | ICD-10-CM | POA: Diagnosis not present

## 2021-09-09 DIAGNOSIS — K649 Unspecified hemorrhoids: Secondary | ICD-10-CM | POA: Diagnosis not present

## 2021-09-09 DIAGNOSIS — Z794 Long term (current) use of insulin: Secondary | ICD-10-CM | POA: Diagnosis not present

## 2021-09-09 DIAGNOSIS — R109 Unspecified abdominal pain: Secondary | ICD-10-CM | POA: Diagnosis not present

## 2021-09-09 DIAGNOSIS — M545 Low back pain, unspecified: Secondary | ICD-10-CM | POA: Diagnosis not present

## 2021-09-17 ENCOUNTER — Other Ambulatory Visit (HOSPITAL_COMMUNITY): Payer: Self-pay | Admitting: Internal Medicine

## 2021-09-17 DIAGNOSIS — Z1231 Encounter for screening mammogram for malignant neoplasm of breast: Secondary | ICD-10-CM

## 2021-10-13 ENCOUNTER — Ambulatory Visit (HOSPITAL_COMMUNITY)
Admission: RE | Admit: 2021-10-13 | Discharge: 2021-10-13 | Disposition: A | Discharge status: Home / Self Care | Payer: Medicare Other | Source: Ambulatory Visit | Attending: Internal Medicine | Admitting: Internal Medicine

## 2021-10-13 ENCOUNTER — Other Ambulatory Visit: Payer: Self-pay

## 2021-10-13 DIAGNOSIS — Z1231 Encounter for screening mammogram for malignant neoplasm of breast: Secondary | ICD-10-CM | POA: Insufficient documentation

## 2021-10-27 DIAGNOSIS — E109 Type 1 diabetes mellitus without complications: Secondary | ICD-10-CM | POA: Diagnosis not present

## 2021-10-27 DIAGNOSIS — E785 Hyperlipidemia, unspecified: Secondary | ICD-10-CM | POA: Diagnosis not present

## 2021-10-29 DIAGNOSIS — R350 Frequency of micturition: Secondary | ICD-10-CM | POA: Diagnosis not present

## 2021-10-29 DIAGNOSIS — E663 Overweight: Secondary | ICD-10-CM | POA: Diagnosis not present

## 2021-10-29 DIAGNOSIS — N390 Urinary tract infection, site not specified: Secondary | ICD-10-CM | POA: Diagnosis not present

## 2021-11-03 DIAGNOSIS — E109 Type 1 diabetes mellitus without complications: Secondary | ICD-10-CM | POA: Diagnosis not present

## 2021-11-03 DIAGNOSIS — E0821 Diabetes mellitus due to underlying condition with diabetic nephropathy: Secondary | ICD-10-CM | POA: Diagnosis not present

## 2021-11-03 DIAGNOSIS — E139 Other specified diabetes mellitus without complications: Secondary | ICD-10-CM | POA: Diagnosis not present

## 2021-11-03 DIAGNOSIS — E785 Hyperlipidemia, unspecified: Secondary | ICD-10-CM | POA: Diagnosis not present

## 2021-11-03 DIAGNOSIS — R945 Abnormal results of liver function studies: Secondary | ICD-10-CM | POA: Diagnosis not present

## 2021-11-03 DIAGNOSIS — E1140 Type 2 diabetes mellitus with diabetic neuropathy, unspecified: Secondary | ICD-10-CM | POA: Diagnosis not present

## 2021-11-03 DIAGNOSIS — N184 Chronic kidney disease, stage 4 (severe): Secondary | ICD-10-CM | POA: Diagnosis not present

## 2021-11-03 DIAGNOSIS — I429 Cardiomyopathy, unspecified: Secondary | ICD-10-CM | POA: Diagnosis not present

## 2021-11-03 DIAGNOSIS — Z6823 Body mass index (BMI) 23.0-23.9, adult: Secondary | ICD-10-CM | POA: Diagnosis not present

## 2021-11-11 ENCOUNTER — Other Ambulatory Visit: Payer: Self-pay

## 2021-11-11 ENCOUNTER — Emergency Department (HOSPITAL_COMMUNITY): Payer: Medicare Other

## 2021-11-11 ENCOUNTER — Encounter (HOSPITAL_COMMUNITY): Payer: Self-pay

## 2021-11-11 ENCOUNTER — Emergency Department (HOSPITAL_COMMUNITY)
Admission: EM | Admit: 2021-11-11 | Discharge: 2021-11-11 | Disposition: A | Discharge status: Home / Self Care | Payer: Medicare Other | Attending: Emergency Medicine | Admitting: Emergency Medicine

## 2021-11-11 DIAGNOSIS — R0781 Pleurodynia: Secondary | ICD-10-CM | POA: Insufficient documentation

## 2021-11-11 DIAGNOSIS — Z794 Long term (current) use of insulin: Secondary | ICD-10-CM | POA: Insufficient documentation

## 2021-11-11 DIAGNOSIS — S6291XA Unspecified fracture of right wrist and hand, initial encounter for closed fracture: Secondary | ICD-10-CM | POA: Insufficient documentation

## 2021-11-11 DIAGNOSIS — E119 Type 2 diabetes mellitus without complications: Secondary | ICD-10-CM | POA: Insufficient documentation

## 2021-11-11 DIAGNOSIS — M25511 Pain in right shoulder: Secondary | ICD-10-CM | POA: Diagnosis not present

## 2021-11-11 DIAGNOSIS — Z7982 Long term (current) use of aspirin: Secondary | ICD-10-CM | POA: Insufficient documentation

## 2021-11-11 DIAGNOSIS — Z79899 Other long term (current) drug therapy: Secondary | ICD-10-CM | POA: Diagnosis not present

## 2021-11-11 DIAGNOSIS — S62101A Fracture of unspecified carpal bone, right wrist, initial encounter for closed fracture: Secondary | ICD-10-CM

## 2021-11-11 DIAGNOSIS — I509 Heart failure, unspecified: Secondary | ICD-10-CM | POA: Insufficient documentation

## 2021-11-11 DIAGNOSIS — I110 Hypertensive heart disease with heart failure: Secondary | ICD-10-CM | POA: Insufficient documentation

## 2021-11-11 DIAGNOSIS — Z043 Encounter for examination and observation following other accident: Secondary | ICD-10-CM | POA: Diagnosis not present

## 2021-11-11 DIAGNOSIS — M25531 Pain in right wrist: Secondary | ICD-10-CM | POA: Diagnosis not present

## 2021-11-11 DIAGNOSIS — S52501A Unspecified fracture of the lower end of right radius, initial encounter for closed fracture: Secondary | ICD-10-CM | POA: Diagnosis not present

## 2021-11-11 DIAGNOSIS — M7989 Other specified soft tissue disorders: Secondary | ICD-10-CM | POA: Diagnosis not present

## 2021-11-11 DIAGNOSIS — W010XXA Fall on same level from slipping, tripping and stumbling without subsequent striking against object, initial encounter: Secondary | ICD-10-CM | POA: Diagnosis not present

## 2021-11-11 DIAGNOSIS — S8001XA Contusion of right knee, initial encounter: Secondary | ICD-10-CM | POA: Diagnosis not present

## 2021-11-11 DIAGNOSIS — W19XXXA Unspecified fall, initial encounter: Secondary | ICD-10-CM

## 2021-11-11 DIAGNOSIS — S6991XA Unspecified injury of right wrist, hand and finger(s), initial encounter: Secondary | ICD-10-CM | POA: Diagnosis present

## 2021-11-11 LAB — CBG MONITORING, ED: Glucose-Capillary: 141 mg/dL — ABNORMAL HIGH (ref 70–99)

## 2021-11-11 MED ORDER — OXYCODONE-ACETAMINOPHEN 7.5-325 MG PO TABS: 1.0000 | ORAL_TABLET | ORAL | 0 refills | Status: AC | PRN

## 2021-11-11 NOTE — ED Triage Notes (Signed)
Patient blowing leaves yesterday and had a fall off of a ledge maybe a foot high. Patient landed on right shoulder arm and wrist. Has complaints of right shoulder, rib, arm, wrist pain, and right leg pain.

## 2021-11-11 NOTE — Discharge Instructions (Addendum)
You may take the Percocet in place of your Vicodin for hopeful better pain relief for the next 3 days.  Do not drive within 4 hours of taking this medication as it will make you drowsy.  Plan to see Dr. Aline Brochure as discussed.  Keep your splint clean and dry.

## 2021-11-11 NOTE — ED Provider Notes (Signed)
[facility] EMERGENCY DEPARTMENT Provider Note   CSN: [id number] Arrival date & time: 11/11/21  0815     History  Chief Complaint  Patient presents with   Lytle Michaels    [name] is a 71 y.o. female with a history including type 2 diabetes, hypertension, CHF, chronic low back pain on daily Vicodin presenting for evaluation of injury sustained in a fall yesterday.  She was blowing the leaves around her pool yesterday when she slipped and fell off of a 1 foot brick ledge landing in mulch.  She landed on her right side, reporting persistent soreness in her right shoulder and right rib cage, denies shortness of breath but does have pain with deep inspiration.  She also has significant throbbing pain, swelling and bruising at her right wrist which took the brunt of her fall.  She also reports a small bruise at her right knee but denies any pain with range of motion of the knee or weightbearing.  She has taken her Vicodin, also employed ice to the wrist but continues to have significant discomfort.  She denies numbness in her fingers.  She is right-handed.  She did not hit her head during this fall.  The history is provided by the patient.      Home Medications Prior to Admission medications   Medication Sig Start Date End Date Taking? Authorizing Provider  oxyCODONE-acetaminophen (PERCOCET) 7.5-325 MG tablet Take 1 tablet by mouth every 4 (four) hours as needed for up to 3 days for severe pain. 11/11/21 11/14/21 Yes Maigan Bittinger, Almyra Free, PA-C  Artificial Tear Solution (SYSTANE CONTACTS OP) Place 1 drop into both eyes 2 (two) times daily.    [provider]  aspirin EC 81 MG tablet Take 81 mg by mouth daily.    [provider]  benzonatate (TESSALON) 200 MG capsule Take 1 capsule (200 mg total) by mouth 3 (three) times daily as needed for cough. 07/03/21   Melynda Ripple, MD  buPROPion (WELLBUTRIN XL) 300 MG 24 hr tablet Take 300 mg by mouth daily. 06/19/20   [provider]   carvedilol (COREG) 6.25 MG tablet TAKE ONE TABLET BY MOUTH TWICE A DAY WITH A MEAL 04/14/21   Branch, Alphonse Guild, MD  clonazePAM (KLONOPIN) 1 MG tablet Take 1 mg by mouth 2 (two) times daily as needed. 03/07/20   [provider]  fluticasone (FLONASE) 50 MCG/ACT nasal spray Place 2 sprays into both nostrils daily. 07/03/21   Melynda Ripple, MD  furosemide (LASIX) 40 MG tablet Take 1 tablet (40 mg total) by mouth daily. 06/06/19   Evans Lance, MD  gabapentin (NEURONTIN) 600 MG tablet Take 600 mg by mouth at bedtime.  11/30/19   [provider]  HYDROcodone-acetaminophen (NORCO) 10-325 MG tablet Take 1-2 tablets by mouth every 6 (six) hours as needed for moderate pain.    [provider]  insulin lispro (HUMALOG KWIKPEN) 100 UNIT/ML KwikPen INJECT UP TO 50 UNITS DAILY Patient taking differently: 5 Units. INJECT UP TO 50 UNITS DAILY - sliding scale 09/21/18   Nida, Marella Chimes, MD  LANTUS SOLOSTAR 100 UNIT/ML Solostar Pen INJECT 50 UNITS INTO THE SKIN DAILY AT 10:00PM Patient taking differently: 10 Units at bedtime. 09/01/18   Cassandria Anger, MD  losartan (COZAAR) 25 MG tablet Take 12.5 mg by mouth daily. 06/19/20   [provider]  omeprazole (PRILOSEC) 20 MG capsule Take 20 mg by mouth daily.    [provider]  ondansetron (ZOFRAN ODT) 4  MG disintegrating tablet Take 1 tablet (4 mg total) by mouth every 8 (eight) hours as needed for nausea or vomiting. 07/03/21   Melynda Ripple, MD  potassium chloride SA (K-DUR,KLOR-CON) 20 MEQ tablet Take 20 mEq by mouth 2 (two) times daily.    [provider]  rosuvastatin (CRESTOR) 40 MG tablet TAKE ONE (1) TABLET BY MOUTH EVERY DAY (STOP ATORVASTATIN) 01/29/20   Corum, Rex Kras, MD  sertraline (ZOLOFT) 100 MG tablet Take 200 mg by mouth daily. 06/24/20   [provider]  zolpidem (AMBIEN) 10 MG tablet Take 10 mg by mouth at bedtime as needed for sleep.    [provider]       Allergies    Patient has no known allergies.    Review of Systems   Review of Systems  Constitutional:  Negative for fever.  Musculoskeletal:  Positive for arthralgias and joint swelling. Negative for myalgias.  Neurological:  Negative for dizziness, weakness, numbness and headaches.   Physical Exam Updated Vital Signs BP 119/71    Pulse 82    Temp 98.3 F (36.8 C) (Oral)    Resp 16    Ht 5\' 1"  (1.549 m)    Wt 62.6 kg    SpO2 99%    BMI 26.07 kg/m  Physical Exam Constitutional:      Appearance: She is well-developed.  HENT:     Head: Atraumatic.  Cardiovascular:     Comments: Pulses equal bilaterally Musculoskeletal:        General: Tenderness present.     Right shoulder: Bony tenderness present. No swelling or deformity.     Right wrist: Swelling and bony tenderness present. Decreased range of motion. Normal pulse.     Left wrist: Normal.     Cervical back: Normal range of motion.     Right knee: No swelling, deformity, effusion or bony tenderness. Normal range of motion.     Comments: Tender to palpation lateral right shoulder humeral head.  No palpable deformity or bruising.  Clavicle is nontender without deformity.  Skin:    General: Skin is warm and dry.  Neurological:     Mental Status: She is alert.     Sensory: No sensory deficit.     Motor: No weakness.     Deep Tendon Reflexes: Reflexes normal.    ED Results / Procedures / Treatments   Labs (all labs ordered are listed, but only abnormal results are displayed) Labs Reviewed  CBG MONITORING, ED - Abnormal; Notable for the following components:      Result Value   Glucose-Capillary 141 (*)    All other components within normal limits    EKG None  Radiology DG Ribs Unilateral W/Chest Right  Result Date: 11/11/2021 CLINICAL DATA:  Fall EXAM: RIGHT RIBS AND CHEST - 3+ VIEW COMPARISON:  06/05/2020 FINDINGS: No fracture or other bone lesions are seen involving the ribs. There is no evidence of pneumothorax  or pleural effusion. Both lungs are clear. Heart size and mediastinal contours are within normal limits. AICD unchanged from the prior study. IMPRESSION: No active cardiopulmonary disease.  Negative for right rib fracture. Electronically Signed   By: Franchot Gallo M.D.   On: 11/11/2021 10:40   DG Shoulder Right  Result Date: 11/11/2021 CLINICAL DATA:  Status post fall, right shoulder pain. EXAM: RIGHT SHOULDER - 2+ VIEW COMPARISON:  None. FINDINGS: No fracture or dislocation. Mild degenerative changes of the acromioclavicular joint. Soft tissues are normal. IMPRESSION: 1. No acute osseous  injury of the right shoulder. Electronically Signed   By: Kathreen Devoid M.D.   On: 11/11/2021 10:38   DG Wrist Complete Right  Result Date: 11/11/2021 CLINICAL DATA:  [place] yesterday landing on RIGHT shoulder, arm, and wrist, having RIGHT wrist pain EXAM: RIGHT WRIST - COMPLETE 3+ VIEW COMPARISON:  None FINDINGS: Osseous mineralization normal. Jewelry artifact at thumb. Joint spaces preserved. Subchondral cystic change at base of proximal phalanx index finger. Calcific density identified at the dorsal margin of the carpus, predominantly corticated, though a portion of the margin adjacent to the carpus is less well-defined. This could represent degenerative changes, soft tissue calcification, or an avulsion fracture arising from the dorsal margin of the carpus potentially triquetrum. Overlying soft tissue swelling at dorsum of carpus. No additional fracture, dislocation, or bone destruction. IMPRESSION: Questionable fracture fragment versus soft tissue calcification or calcified body dorsal to the carpus on lateral view; recommend correlation with tenderness at this site. Electronically Signed   By: Lavonia Dana M.D.   On: 11/11/2021 10:41    Procedures Procedures    SPLINT APPLICATION Date/Time: 31:54 AM Authorized by: Evalee Jefferson Consent: Verbal consent obtained. Risks and benefits: risks, benefits and alternatives  were discussed Consent given by: patient Splint applied by: RN Location details: right forearm Splint type: short arm Supplies used: fiber casting, ace wraps, webril, sling Post-procedure: The splinted body part was neurovascularly unchanged following the procedure. Patient tolerance: Patient tolerated the procedure well with no immediate complications.    Medications Ordered in ED Medications - No data to display  ED Course/ Medical Decision Making/ A&P                           Medical Decision Making Patient with a fall from 1 foot height occurring yesterday.  Multiple complaints of pain, exam is showing significant swelling and bruising, suspected fracture at her right wrist.  Amount and/or Complexity of Data Reviewed Radiology: ordered and independent interpretation performed.    Details: Imaging reviewed and discussed with patient including right shoulder, ribs and right wrist showing a fragment fracture dorsal to the carpus which is the site of her pain and swelling.  Risk Prescription drug management. Risk Details: Patient will need outpatient follow-up with orthopedics.  She already sees Dr. Aline Brochure for her chronic back pain.  She has stopped by his office prior to coming here, he was not in the office but she has made an appointment with him in 2 days.  Encouraged to keep this appointment.  We discussed ice and elevation, she was placed in a short forearm splint to protect her injury.  She chronically takes hydrocodone 3 times daily for her back pain.  She was prescribed Percocet to use in place of her hydrocodone for the next 3 days, further pain management per Dr. Ruthe Mannan recommendations.           Final Clinical Impression(s) / ED Diagnoses Final diagnoses:  Closed fracture of right wrist, initial encounter  Fall, initial encounter    Rx / DC Orders ED Discharge Orders          Ordered    oxyCODONE-acetaminophen (PERCOCET) 7.5-325 MG tablet  Every 4  hours PRN        11/11/21 1110              Evalee Jefferson, PA-C 11/11/21 [place], Ankit, MD 11/16/21 1410

## 2021-11-13 ENCOUNTER — Encounter: Payer: Self-pay | Admitting: Orthopedic Surgery

## 2021-11-13 ENCOUNTER — Other Ambulatory Visit: Payer: Self-pay

## 2021-11-13 ENCOUNTER — Ambulatory Visit (INDEPENDENT_AMBULATORY_CARE_PROVIDER_SITE_OTHER): Payer: Medicare Other | Admitting: Orthopedic Surgery

## 2021-11-13 VITALS — BP 193/94 | HR 94 | Ht 61.0 in | Wt 148.0 lb

## 2021-11-13 DIAGNOSIS — M25531 Pain in right wrist: Secondary | ICD-10-CM | POA: Diagnosis not present

## 2021-11-13 NOTE — Progress Notes (Signed)
Chief Complaint  ?Patient presents with  ? Wrist Pain  ?  RT wrist// patient fell off of a brick wall ?DOI 11/10/21  ? ?This is a 71 year old female who fell and injured her wrist on 27 February complains of right wrist pain x-rays show questionable abnormality over the dorsum of the wrist where she is tender swollen and painful she is here for evaluation and management ? ?Review of systems noncontributory ? ?BP (!) 193/94   Pulse 94   Ht 5\' 1"  (1.549 m)   Wt 148 lb (67.1 kg)   BMI 27.96 kg/m?  ? ?The right wrist is tender and swollen she has painful range of motion most of the pain is on the dorsum of the wrist the scaphoid tubercle is nontender the snuffbox is mildly tender ? ?Neurovascular exam is intact there is some stiffness and decreased range of motion due to swelling ? ?X-rays were evaluated and included a right wrist film which showed no evidence of fracture there is questionable calcification on the dorsum of the wrist ? ?Plan ?Wrist splint ? ?Rex-ray include scaphoid right wrist 2 weeks ?

## 2021-11-17 DIAGNOSIS — Z20822 Contact with and (suspected) exposure to covid-19: Secondary | ICD-10-CM | POA: Diagnosis not present

## 2021-11-20 DIAGNOSIS — H25813 Combined forms of age-related cataract, bilateral: Secondary | ICD-10-CM | POA: Diagnosis not present

## 2021-11-20 DIAGNOSIS — H353131 Nonexudative age-related macular degeneration, bilateral, early dry stage: Secondary | ICD-10-CM | POA: Diagnosis not present

## 2021-11-21 ENCOUNTER — Encounter (INDEPENDENT_AMBULATORY_CARE_PROVIDER_SITE_OTHER): Payer: Medicare Other

## 2021-11-21 DIAGNOSIS — I255 Ischemic cardiomyopathy: Secondary | ICD-10-CM

## 2021-11-21 LAB — CUP PACEART REMOTE DEVICE CHECK
Battery Remaining Longevity: 86 mo
Battery Voltage: 2.99 V
Brady Statistic AP VP Percent: 0.02 %
Brady Statistic AP VS Percent: 0.02 %
Brady Statistic AS VP Percent: 98.73 %
Brady Statistic AS VS Percent: 1.23 %
Brady Statistic RA Percent Paced: 0.03 %
Brady Statistic RV Percent Paced: 19.5 %
Date Time Interrogation Session: 20230310053114
Implantable Lead Implant Date: 20060214
Implantable Lead Implant Date: 20150406
Implantable Lead Implant Date: 20150406
Implantable Lead Location: 753858
Implantable Lead Location: 753859
Implantable Lead Location: 753860
Implantable Lead Model: 4396
Implantable Lead Model: 5076
Implantable Pulse Generator Implant Date: 20210610
Lead Channel Impedance Value: 1330 Ohm
Lead Channel Impedance Value: 1444 Ohm
Lead Channel Impedance Value: 361 Ohm
Lead Channel Impedance Value: 456 Ohm
Lead Channel Impedance Value: 513 Ohm
Lead Channel Impedance Value: 570 Ohm
Lead Channel Impedance Value: 646 Ohm
Lead Channel Impedance Value: 874 Ohm
Lead Channel Impedance Value: 874 Ohm
Lead Channel Pacing Threshold Amplitude: 1.25 V
Lead Channel Pacing Threshold Amplitude: 1.25 V
Lead Channel Pacing Threshold Amplitude: 2 V
Lead Channel Pacing Threshold Pulse Width: 0.4 ms
Lead Channel Pacing Threshold Pulse Width: 0.4 ms
Lead Channel Pacing Threshold Pulse Width: 0.8 ms
Lead Channel Sensing Intrinsic Amplitude: 2.625 mV
Lead Channel Sensing Intrinsic Amplitude: 2.625 mV
Lead Channel Sensing Intrinsic Amplitude: 6.375 mV
Lead Channel Sensing Intrinsic Amplitude: 6.375 mV
Lead Channel Setting Pacing Amplitude: 2 V
Lead Channel Setting Pacing Amplitude: 2.5 V
Lead Channel Setting Pacing Amplitude: 2.75 V
Lead Channel Setting Pacing Pulse Width: 0.4 ms
Lead Channel Setting Pacing Pulse Width: 0.8 ms
Lead Channel Setting Sensing Sensitivity: 0.9 mV

## 2021-11-26 NOTE — Progress Notes (Signed)
Remote pacemaker transmission.   

## 2021-11-27 ENCOUNTER — Other Ambulatory Visit: Payer: Self-pay

## 2021-11-27 ENCOUNTER — Ambulatory Visit: Payer: Medicare Other

## 2021-11-27 ENCOUNTER — Ambulatory Visit (INDEPENDENT_AMBULATORY_CARE_PROVIDER_SITE_OTHER): Payer: Medicare Other | Admitting: Orthopedic Surgery

## 2021-11-27 DIAGNOSIS — M659 Synovitis and tenosynovitis, unspecified: Secondary | ICD-10-CM

## 2021-11-27 DIAGNOSIS — I255 Ischemic cardiomyopathy: Secondary | ICD-10-CM | POA: Diagnosis not present

## 2021-11-27 DIAGNOSIS — S62024D Nondisplaced fracture of middle third of navicular [scaphoid] bone of right wrist, subsequent encounter for fracture with routine healing: Secondary | ICD-10-CM

## 2021-11-27 DIAGNOSIS — M79645 Pain in left finger(s): Secondary | ICD-10-CM | POA: Diagnosis not present

## 2021-11-27 NOTE — Progress Notes (Signed)
Chief Complaint  ?Patient presents with  ? fracture care  ?  RT wrist/scaphoid ?DOI 11/13/21  ? long finger pain  ?  Patient is requesting an xray of this finger as well. States she hurt it the same day she hurt the right arm  ? ?[name] is here for 2-week follow-up regarding her right wrist questionable scaphoid fracture she has been in a brace removable Titan Velcro splint  ? ?She really does not have pain in her scaphoid over the snuffbox or the tubercle ? ?The x-ray shows a sclerotic line running across the waist of the scaphoid ? ?Going to consider this a fracture and keep her in the brace ? ?She says she hurt her left long finger wants an x-ray which we did it was negative ? ?The exam is consistent with tenosynovitis and possible developing trigger finger ? ?She was splinted in extension she can remove the splint for bathing and then reapply ? ?She will follow-up in 4 weeks ?

## 2021-12-03 DIAGNOSIS — S0501XA Injury of conjunctiva and corneal abrasion without foreign body, right eye, initial encounter: Secondary | ICD-10-CM | POA: Diagnosis not present

## 2021-12-05 DIAGNOSIS — S0502XD Injury of conjunctiva and corneal abrasion without foreign body, left eye, subsequent encounter: Secondary | ICD-10-CM | POA: Diagnosis not present

## 2021-12-05 DIAGNOSIS — E119 Type 2 diabetes mellitus without complications: Secondary | ICD-10-CM | POA: Diagnosis not present

## 2021-12-05 DIAGNOSIS — H353132 Nonexudative age-related macular degeneration, bilateral, intermediate dry stage: Secondary | ICD-10-CM | POA: Diagnosis not present

## 2021-12-05 DIAGNOSIS — I10 Essential (primary) hypertension: Secondary | ICD-10-CM | POA: Diagnosis not present

## 2021-12-05 DIAGNOSIS — H25813 Combined forms of age-related cataract, bilateral: Secondary | ICD-10-CM | POA: Diagnosis not present

## 2021-12-05 DIAGNOSIS — H35033 Hypertensive retinopathy, bilateral: Secondary | ICD-10-CM | POA: Diagnosis not present

## 2021-12-08 DIAGNOSIS — E782 Mixed hyperlipidemia: Secondary | ICD-10-CM | POA: Diagnosis not present

## 2021-12-08 DIAGNOSIS — H25811 Combined forms of age-related cataract, right eye: Secondary | ICD-10-CM | POA: Diagnosis not present

## 2021-12-08 DIAGNOSIS — E1165 Type 2 diabetes mellitus with hyperglycemia: Secondary | ICD-10-CM | POA: Diagnosis not present

## 2021-12-08 DIAGNOSIS — Z01818 Encounter for other preprocedural examination: Secondary | ICD-10-CM | POA: Diagnosis not present

## 2021-12-08 DIAGNOSIS — H25812 Combined forms of age-related cataract, left eye: Secondary | ICD-10-CM | POA: Diagnosis not present

## 2021-12-10 DIAGNOSIS — E782 Mixed hyperlipidemia: Secondary | ICD-10-CM | POA: Diagnosis not present

## 2021-12-10 DIAGNOSIS — I4891 Unspecified atrial fibrillation: Secondary | ICD-10-CM | POA: Diagnosis not present

## 2021-12-10 DIAGNOSIS — R945 Abnormal results of liver function studies: Secondary | ICD-10-CM | POA: Diagnosis not present

## 2021-12-10 DIAGNOSIS — K219 Gastro-esophageal reflux disease without esophagitis: Secondary | ICD-10-CM | POA: Diagnosis not present

## 2021-12-10 DIAGNOSIS — M79673 Pain in unspecified foot: Secondary | ICD-10-CM | POA: Diagnosis not present

## 2021-12-10 DIAGNOSIS — N390 Urinary tract infection, site not specified: Secondary | ICD-10-CM | POA: Diagnosis not present

## 2021-12-10 DIAGNOSIS — D649 Anemia, unspecified: Secondary | ICD-10-CM | POA: Diagnosis not present

## 2021-12-10 DIAGNOSIS — M545 Low back pain, unspecified: Secondary | ICD-10-CM | POA: Diagnosis not present

## 2021-12-10 DIAGNOSIS — E1122 Type 2 diabetes mellitus with diabetic chronic kidney disease: Secondary | ICD-10-CM | POA: Diagnosis not present

## 2021-12-10 DIAGNOSIS — I10 Essential (primary) hypertension: Secondary | ICD-10-CM | POA: Diagnosis not present

## 2021-12-10 DIAGNOSIS — Z23 Encounter for immunization: Secondary | ICD-10-CM | POA: Diagnosis not present

## 2021-12-10 DIAGNOSIS — F411 Generalized anxiety disorder: Secondary | ICD-10-CM | POA: Diagnosis not present

## 2021-12-17 DIAGNOSIS — H25811 Combined forms of age-related cataract, right eye: Secondary | ICD-10-CM | POA: Diagnosis not present

## 2021-12-17 DIAGNOSIS — H2511 Age-related nuclear cataract, right eye: Secondary | ICD-10-CM | POA: Diagnosis not present

## 2021-12-20 ENCOUNTER — Emergency Department (HOSPITAL_COMMUNITY)
Admission: EM | Admit: 2021-12-20 | Discharge: 2021-12-20 | Disposition: A | Discharge status: Home / Self Care | Payer: Medicare Other | Source: Home / Self Care

## 2021-12-25 ENCOUNTER — Ambulatory Visit: Payer: Medicare Other

## 2021-12-25 ENCOUNTER — Encounter: Payer: Self-pay | Admitting: Orthopedic Surgery

## 2021-12-25 ENCOUNTER — Ambulatory Visit (INDEPENDENT_AMBULATORY_CARE_PROVIDER_SITE_OTHER): Payer: Medicare Other | Admitting: Orthopedic Surgery

## 2021-12-25 VITALS — Ht 61.0 in | Wt 148.0 lb

## 2021-12-25 DIAGNOSIS — I255 Ischemic cardiomyopathy: Secondary | ICD-10-CM

## 2021-12-25 DIAGNOSIS — M659 Synovitis and tenosynovitis, unspecified: Secondary | ICD-10-CM

## 2021-12-25 DIAGNOSIS — N19 Unspecified kidney failure: Secondary | ICD-10-CM | POA: Diagnosis not present

## 2021-12-25 DIAGNOSIS — S62024D Nondisplaced fracture of middle third of navicular [scaphoid] bone of right wrist, subsequent encounter for fracture with routine healing: Secondary | ICD-10-CM

## 2021-12-25 DIAGNOSIS — E1122 Type 2 diabetes mellitus with diabetic chronic kidney disease: Secondary | ICD-10-CM | POA: Diagnosis not present

## 2021-12-25 DIAGNOSIS — E1129 Type 2 diabetes mellitus with other diabetic kidney complication: Secondary | ICD-10-CM | POA: Diagnosis not present

## 2021-12-25 DIAGNOSIS — R809 Proteinuria, unspecified: Secondary | ICD-10-CM | POA: Diagnosis not present

## 2021-12-25 DIAGNOSIS — I5042 Chronic combined systolic (congestive) and diastolic (congestive) heart failure: Secondary | ICD-10-CM | POA: Diagnosis not present

## 2021-12-25 DIAGNOSIS — N189 Chronic kidney disease, unspecified: Secondary | ICD-10-CM | POA: Diagnosis not present

## 2021-12-25 DIAGNOSIS — I129 Hypertensive chronic kidney disease with stage 1 through stage 4 chronic kidney disease, or unspecified chronic kidney disease: Secondary | ICD-10-CM | POA: Diagnosis not present

## 2021-12-25 NOTE — Progress Notes (Signed)
FOLLOW UP  ? ?Encounter Diagnoses  ?Name Primary?  ? Closed nondisplaced fracture of middle third of scaphoid of right wrist with routine healing, subsequent encounter Yes  ? Tenosynovitis of finger left long    ? ? ? ?Chief Complaint  ?Patient presents with  ? Fracture  ?  Rt wrist DOI 11/13/21  ? ? ? ?[name] injured her right wrist and or on November 13, 2021 we treated her for scaphoid fracture when we saw a radiolucent line around the waist.  We kept her in a splint.  She also was treated for left long finger triggering with splinting as she did not want an injection ? ?She is here for x-rays of the right wrist 6 weeks post injury and recheck on the left long finger ? ?Right wrist is nontender no pain full range of motion x-ray looks like a fracture has healed at the scaphoid ? ?Left long finger improved but patient says it is okay to go ahead with the injection since she is still tender although not locking she is having popping ? ?Trigger finger injection ? ?Diagnosis left long finger triggering ?Procedure injection A1 pulley ?Medications lidocaine 1% 1 mL and Depo-Medrol 40 mg 1 mL ?Skin prep alcohol and ethyl chloride ?Verbal consent was obtained ?Timeout confirmed the injection site ? ?After cleaning the skin with alcohol and anesthetizing the skin with ethyl chloride the A1 pulley was palpated and the injection was performed without complication  ?

## 2021-12-29 DIAGNOSIS — Z20822 Contact with and (suspected) exposure to covid-19: Secondary | ICD-10-CM | POA: Diagnosis not present

## 2021-12-31 DIAGNOSIS — H25812 Combined forms of age-related cataract, left eye: Secondary | ICD-10-CM | POA: Diagnosis not present

## 2022-01-01 DIAGNOSIS — N19 Unspecified kidney failure: Secondary | ICD-10-CM | POA: Diagnosis not present

## 2022-01-01 DIAGNOSIS — I129 Hypertensive chronic kidney disease with stage 1 through stage 4 chronic kidney disease, or unspecified chronic kidney disease: Secondary | ICD-10-CM | POA: Diagnosis not present

## 2022-01-01 DIAGNOSIS — N189 Chronic kidney disease, unspecified: Secondary | ICD-10-CM | POA: Diagnosis not present

## 2022-01-01 DIAGNOSIS — E1122 Type 2 diabetes mellitus with diabetic chronic kidney disease: Secondary | ICD-10-CM | POA: Diagnosis not present

## 2022-01-01 DIAGNOSIS — R809 Proteinuria, unspecified: Secondary | ICD-10-CM | POA: Diagnosis not present

## 2022-01-01 DIAGNOSIS — E1129 Type 2 diabetes mellitus with other diabetic kidney complication: Secondary | ICD-10-CM | POA: Diagnosis not present

## 2022-01-01 DIAGNOSIS — I5042 Chronic combined systolic (congestive) and diastolic (congestive) heart failure: Secondary | ICD-10-CM | POA: Diagnosis not present

## 2022-01-14 DIAGNOSIS — H353132 Nonexudative age-related macular degeneration, bilateral, intermediate dry stage: Secondary | ICD-10-CM | POA: Diagnosis not present

## 2022-01-14 DIAGNOSIS — H35033 Hypertensive retinopathy, bilateral: Secondary | ICD-10-CM | POA: Diagnosis not present

## 2022-01-14 DIAGNOSIS — E119 Type 2 diabetes mellitus without complications: Secondary | ICD-10-CM | POA: Diagnosis not present

## 2022-02-14 DIAGNOSIS — S76012A Strain of muscle, fascia and tendon of left hip, initial encounter: Secondary | ICD-10-CM | POA: Diagnosis not present

## 2022-02-14 DIAGNOSIS — R070 Pain in throat: Secondary | ICD-10-CM | POA: Diagnosis not present

## 2022-02-14 DIAGNOSIS — R059 Cough, unspecified: Secondary | ICD-10-CM | POA: Diagnosis not present

## 2022-02-17 ENCOUNTER — Inpatient Hospital Stay (HOSPITAL_COMMUNITY)
Admission: EM | Admit: 2022-02-17 | Discharge: 2022-03-04 | DRG: 576 | Disposition: A | Discharge status: Home Health | Payer: Medicare Other | Attending: Internal Medicine | Admitting: Internal Medicine

## 2022-02-17 ENCOUNTER — Other Ambulatory Visit: Payer: Self-pay

## 2022-02-17 ENCOUNTER — Encounter (HOSPITAL_COMMUNITY)
Admission: EM | Disposition: A | Discharge status: Home Health | Payer: Self-pay | Source: Home / Self Care | Attending: Student

## 2022-02-17 ENCOUNTER — Inpatient Hospital Stay (HOSPITAL_COMMUNITY): Payer: Medicare Other | Admitting: Certified Registered"

## 2022-02-17 ENCOUNTER — Emergency Department (HOSPITAL_COMMUNITY): Payer: Medicare Other

## 2022-02-17 ENCOUNTER — Inpatient Hospital Stay (HOSPITAL_COMMUNITY): Payer: Medicare Other

## 2022-02-17 ENCOUNTER — Encounter (HOSPITAL_COMMUNITY): Payer: Self-pay

## 2022-02-17 DIAGNOSIS — I5032 Chronic diastolic (congestive) heart failure: Secondary | ICD-10-CM

## 2022-02-17 DIAGNOSIS — F419 Anxiety disorder, unspecified: Secondary | ICD-10-CM | POA: Diagnosis present

## 2022-02-17 DIAGNOSIS — E119 Type 2 diabetes mellitus without complications: Secondary | ICD-10-CM

## 2022-02-17 DIAGNOSIS — W1809XA Striking against other object with subsequent fall, initial encounter: Secondary | ICD-10-CM | POA: Diagnosis present

## 2022-02-17 DIAGNOSIS — N3000 Acute cystitis without hematuria: Secondary | ICD-10-CM | POA: Diagnosis not present

## 2022-02-17 DIAGNOSIS — M542 Cervicalgia: Secondary | ICD-10-CM | POA: Diagnosis not present

## 2022-02-17 DIAGNOSIS — R579 Shock, unspecified: Secondary | ICD-10-CM

## 2022-02-17 DIAGNOSIS — N1832 Chronic kidney disease, stage 3b: Secondary | ICD-10-CM | POA: Diagnosis present

## 2022-02-17 DIAGNOSIS — Z79899 Other long term (current) drug therapy: Secondary | ICD-10-CM

## 2022-02-17 DIAGNOSIS — E871 Hypo-osmolality and hyponatremia: Secondary | ICD-10-CM | POA: Diagnosis present

## 2022-02-17 DIAGNOSIS — S01112A Laceration without foreign body of left eyelid and periocular area, initial encounter: Secondary | ICD-10-CM | POA: Diagnosis present

## 2022-02-17 DIAGNOSIS — S0121XA Laceration without foreign body of nose, initial encounter: Secondary | ICD-10-CM | POA: Diagnosis not present

## 2022-02-17 DIAGNOSIS — Z95810 Presence of automatic (implantable) cardiac defibrillator: Secondary | ICD-10-CM | POA: Diagnosis present

## 2022-02-17 DIAGNOSIS — Z20822 Contact with and (suspected) exposure to covid-19: Secondary | ICD-10-CM | POA: Diagnosis not present

## 2022-02-17 DIAGNOSIS — Z833 Family history of diabetes mellitus: Secondary | ICD-10-CM

## 2022-02-17 DIAGNOSIS — I959 Hypotension, unspecified: Secondary | ICD-10-CM | POA: Diagnosis present

## 2022-02-17 DIAGNOSIS — N170 Acute kidney failure with tubular necrosis: Secondary | ICD-10-CM | POA: Diagnosis present

## 2022-02-17 DIAGNOSIS — N179 Acute kidney failure, unspecified: Secondary | ICD-10-CM | POA: Diagnosis present

## 2022-02-17 DIAGNOSIS — N289 Disorder of kidney and ureter, unspecified: Secondary | ICD-10-CM | POA: Diagnosis not present

## 2022-02-17 DIAGNOSIS — Z8673 Personal history of transient ischemic attack (TIA), and cerebral infarction without residual deficits: Secondary | ICD-10-CM

## 2022-02-17 DIAGNOSIS — B179 Acute viral hepatitis, unspecified: Secondary | ICD-10-CM | POA: Diagnosis present

## 2022-02-17 DIAGNOSIS — R571 Hypovolemic shock: Secondary | ICD-10-CM | POA: Diagnosis present

## 2022-02-17 DIAGNOSIS — G4700 Insomnia, unspecified: Secondary | ICD-10-CM | POA: Diagnosis present

## 2022-02-17 DIAGNOSIS — R410 Disorientation, unspecified: Secondary | ICD-10-CM

## 2022-02-17 DIAGNOSIS — N189 Chronic kidney disease, unspecified: Secondary | ICD-10-CM | POA: Diagnosis present

## 2022-02-17 DIAGNOSIS — D631 Anemia in chronic kidney disease: Secondary | ICD-10-CM | POA: Diagnosis present

## 2022-02-17 DIAGNOSIS — G8929 Other chronic pain: Secondary | ICD-10-CM | POA: Diagnosis present

## 2022-02-17 DIAGNOSIS — D696 Thrombocytopenia, unspecified: Secondary | ICD-10-CM | POA: Diagnosis not present

## 2022-02-17 DIAGNOSIS — I639 Cerebral infarction, unspecified: Secondary | ICD-10-CM | POA: Diagnosis not present

## 2022-02-17 DIAGNOSIS — I9589 Other hypotension: Secondary | ICD-10-CM | POA: Diagnosis not present

## 2022-02-17 DIAGNOSIS — R519 Headache, unspecified: Secondary | ICD-10-CM | POA: Diagnosis not present

## 2022-02-17 DIAGNOSIS — S01412A Laceration without foreign body of left cheek and temporomandibular area, initial encounter: Principal | ICD-10-CM | POA: Diagnosis present

## 2022-02-17 DIAGNOSIS — E876 Hypokalemia: Secondary | ICD-10-CM | POA: Diagnosis present

## 2022-02-17 DIAGNOSIS — E872 Acidosis, unspecified: Secondary | ICD-10-CM | POA: Diagnosis present

## 2022-02-17 DIAGNOSIS — S0180XA Unspecified open wound of other part of head, initial encounter: Secondary | ICD-10-CM

## 2022-02-17 DIAGNOSIS — D62 Acute posthemorrhagic anemia: Secondary | ICD-10-CM | POA: Diagnosis not present

## 2022-02-17 DIAGNOSIS — E1122 Type 2 diabetes mellitus with diabetic chronic kidney disease: Secondary | ICD-10-CM | POA: Diagnosis present

## 2022-02-17 DIAGNOSIS — Z23 Encounter for immunization: Secondary | ICD-10-CM | POA: Diagnosis not present

## 2022-02-17 DIAGNOSIS — Y92009 Unspecified place in unspecified non-institutional (private) residence as the place of occurrence of the external cause: Secondary | ICD-10-CM

## 2022-02-17 DIAGNOSIS — E1165 Type 2 diabetes mellitus with hyperglycemia: Secondary | ICD-10-CM | POA: Diagnosis not present

## 2022-02-17 DIAGNOSIS — Z794 Long term (current) use of insulin: Secondary | ICD-10-CM | POA: Diagnosis not present

## 2022-02-17 DIAGNOSIS — G9341 Metabolic encephalopathy: Secondary | ICD-10-CM | POA: Diagnosis not present

## 2022-02-17 DIAGNOSIS — F1010 Alcohol abuse, uncomplicated: Secondary | ICD-10-CM | POA: Diagnosis not present

## 2022-02-17 DIAGNOSIS — F32A Depression, unspecified: Secondary | ICD-10-CM | POA: Diagnosis present

## 2022-02-17 DIAGNOSIS — F10932 Alcohol use, unspecified with withdrawal with perceptual disturbance: Secondary | ICD-10-CM | POA: Diagnosis not present

## 2022-02-17 DIAGNOSIS — A498 Other bacterial infections of unspecified site: Secondary | ICD-10-CM | POA: Insufficient documentation

## 2022-02-17 DIAGNOSIS — S0993XA Unspecified injury of face, initial encounter: Secondary | ICD-10-CM | POA: Diagnosis present

## 2022-02-17 DIAGNOSIS — M25552 Pain in left hip: Secondary | ICD-10-CM | POA: Diagnosis not present

## 2022-02-17 DIAGNOSIS — F10231 Alcohol dependence with withdrawal delirium: Secondary | ICD-10-CM | POA: Diagnosis not present

## 2022-02-17 DIAGNOSIS — K219 Gastro-esophageal reflux disease without esophagitis: Secondary | ICD-10-CM | POA: Diagnosis present

## 2022-02-17 DIAGNOSIS — I130 Hypertensive heart and chronic kidney disease with heart failure and stage 1 through stage 4 chronic kidney disease, or unspecified chronic kidney disease: Secondary | ICD-10-CM | POA: Diagnosis not present

## 2022-02-17 DIAGNOSIS — Z981 Arthrodesis status: Secondary | ICD-10-CM

## 2022-02-17 DIAGNOSIS — Z781 Physical restraint status: Secondary | ICD-10-CM

## 2022-02-17 DIAGNOSIS — R441 Visual hallucinations: Secondary | ICD-10-CM

## 2022-02-17 DIAGNOSIS — R102 Pelvic and perineal pain: Secondary | ICD-10-CM | POA: Diagnosis not present

## 2022-02-17 DIAGNOSIS — I255 Ischemic cardiomyopathy: Secondary | ICD-10-CM | POA: Diagnosis not present

## 2022-02-17 DIAGNOSIS — G928 Other toxic encephalopathy: Secondary | ICD-10-CM | POA: Diagnosis present

## 2022-02-17 DIAGNOSIS — E860 Dehydration: Secondary | ICD-10-CM | POA: Diagnosis present

## 2022-02-17 DIAGNOSIS — Z221 Carrier of other intestinal infectious diseases: Secondary | ICD-10-CM | POA: Diagnosis not present

## 2022-02-17 DIAGNOSIS — N319 Neuromuscular dysfunction of bladder, unspecified: Secondary | ICD-10-CM | POA: Diagnosis present

## 2022-02-17 DIAGNOSIS — I429 Cardiomyopathy, unspecified: Secondary | ICD-10-CM | POA: Diagnosis present

## 2022-02-17 DIAGNOSIS — E7800 Pure hypercholesterolemia, unspecified: Secondary | ICD-10-CM | POA: Diagnosis present

## 2022-02-17 DIAGNOSIS — F10939 Alcohol use, unspecified with withdrawal, unspecified: Secondary | ICD-10-CM

## 2022-02-17 DIAGNOSIS — J9811 Atelectasis: Secondary | ICD-10-CM | POA: Diagnosis not present

## 2022-02-17 DIAGNOSIS — S3993XA Unspecified injury of pelvis, initial encounter: Secondary | ICD-10-CM | POA: Diagnosis not present

## 2022-02-17 DIAGNOSIS — M6282 Rhabdomyolysis: Secondary | ICD-10-CM | POA: Diagnosis present

## 2022-02-17 DIAGNOSIS — F341 Dysthymic disorder: Secondary | ICD-10-CM | POA: Diagnosis present

## 2022-02-17 DIAGNOSIS — W19XXXA Unspecified fall, initial encounter: Principal | ICD-10-CM | POA: Diagnosis present

## 2022-02-17 DIAGNOSIS — I5042 Chronic combined systolic (congestive) and diastolic (congestive) heart failure: Secondary | ICD-10-CM | POA: Diagnosis not present

## 2022-02-17 DIAGNOSIS — W01190A Fall on same level from slipping, tripping and stumbling with subsequent striking against furniture, initial encounter: Secondary | ICD-10-CM | POA: Diagnosis not present

## 2022-02-17 DIAGNOSIS — I10 Essential (primary) hypertension: Secondary | ICD-10-CM

## 2022-02-17 DIAGNOSIS — M545 Low back pain, unspecified: Secondary | ICD-10-CM | POA: Diagnosis present

## 2022-02-17 DIAGNOSIS — R9431 Abnormal electrocardiogram [ECG] [EKG]: Secondary | ICD-10-CM | POA: Diagnosis present

## 2022-02-17 DIAGNOSIS — N390 Urinary tract infection, site not specified: Secondary | ICD-10-CM | POA: Diagnosis present

## 2022-02-17 DIAGNOSIS — Z950 Presence of cardiac pacemaker: Secondary | ICD-10-CM

## 2022-02-17 DIAGNOSIS — R55 Syncope and collapse: Secondary | ICD-10-CM | POA: Diagnosis not present

## 2022-02-17 HISTORY — PX: LACERATION REPAIR: SHX5284

## 2022-02-17 LAB — COMPREHENSIVE METABOLIC PANEL
ALT: 68 U/L — ABNORMAL HIGH (ref 0–44)
AST: 162 U/L — ABNORMAL HIGH (ref 15–41)
Albumin: 3.7 g/dL (ref 3.5–5.0)
Alkaline Phosphatase: 111 U/L (ref 38–126)
Anion gap: 15 (ref 5–15)
BUN: 67 mg/dL — ABNORMAL HIGH (ref 8–23)
CO2: 18 mmol/L — ABNORMAL LOW (ref 22–32)
Calcium: 8.8 mg/dL — ABNORMAL LOW (ref 8.9–10.3)
Chloride: 99 mmol/L (ref 98–111)
Creatinine, Ser: 5.58 mg/dL — ABNORMAL HIGH (ref 0.44–1.00)
GFR, Estimated: 8 mL/min — ABNORMAL LOW (ref >60)
Glucose, Bld: 256 mg/dL — ABNORMAL HIGH (ref 70–99)
Potassium: 4.3 mmol/L (ref 3.5–5.1)
Sodium: 132 mmol/L — ABNORMAL LOW (ref 135–145)
Total Bilirubin: 1.1 mg/dL (ref 0.3–1.2)
Total Protein: 7.3 g/dL (ref 6.5–8.1)

## 2022-02-17 LAB — HEMOGLOBIN A1C
Hgb A1c MFr Bld: 7.3 % — ABNORMAL HIGH (ref 4.8–5.6)
Mean Plasma Glucose: 162.81 mg/dL

## 2022-02-17 LAB — CBC
HCT: 32.8 % — ABNORMAL LOW (ref 36.0–46.0)
HCT: 29.4 % — ABNORMAL LOW (ref 36.0–46.0)
Hemoglobin: 10.8 g/dL — ABNORMAL LOW (ref 12.0–15.0)
Hemoglobin: 9.5 g/dL — ABNORMAL LOW (ref 12.0–15.0)
MCH: 31.4 pg (ref 26.0–34.0)
MCH: 31.6 pg (ref 26.0–34.0)
MCHC: 32.9 g/dL (ref 30.0–36.0)
MCHC: 32.3 g/dL (ref 30.0–36.0)
MCV: 95.3 fL (ref 80.0–100.0)
MCV: 97.7 fL (ref 80.0–100.0)
Platelets: 172 10*3/uL (ref 150–400)
Platelets: 133 10*3/uL — ABNORMAL LOW (ref 150–400)
RBC: 3.44 MIL/uL — ABNORMAL LOW (ref 3.87–5.11)
RBC: 3.01 MIL/uL — ABNORMAL LOW (ref 3.87–5.11)
RDW: 14 % (ref 11.5–15.5)
RDW: 14.3 % (ref 11.5–15.5)
WBC: 13.3 10*3/uL — ABNORMAL HIGH (ref 4.0–10.5)
WBC: 10.5 10*3/uL (ref 4.0–10.5)
nRBC: 0 % (ref 0.0–0.2)
nRBC: 0 % (ref 0.0–0.2)

## 2022-02-17 LAB — URINALYSIS, ROUTINE W REFLEX MICROSCOPIC
Bilirubin Urine: NEGATIVE
Glucose, UA: NEGATIVE mg/dL
Ketones, ur: NEGATIVE mg/dL
Nitrite: NEGATIVE
Protein, ur: 100 mg/dL — AB
Specific Gravity, Urine: 1.012 (ref 1.005–1.030)
WBC, UA: >50 WBC/hpf — ABNORMAL HIGH (ref 0–5)
pH: 5 (ref 5.0–8.0)

## 2022-02-17 LAB — SAMPLE TO BLOOD BANK

## 2022-02-17 LAB — I-STAT CHEM 8, ED
BUN: 66 mg/dL — ABNORMAL HIGH (ref 8–23)
Calcium, Ion: 1.08 mmol/L — ABNORMAL LOW (ref 1.15–1.40)
Chloride: 99 mmol/L (ref 98–111)
Creatinine, Ser: 6.4 mg/dL — ABNORMAL HIGH (ref 0.44–1.00)
Glucose, Bld: 245 mg/dL — ABNORMAL HIGH (ref 70–99)
HCT: 33 % — ABNORMAL LOW (ref 36.0–46.0)
Hemoglobin: 11.2 g/dL — ABNORMAL LOW (ref 12.0–15.0)
Potassium: 4.3 mmol/L (ref 3.5–5.1)
Sodium: 132 mmol/L — ABNORMAL LOW (ref 135–145)
TCO2: 17 mmol/L — ABNORMAL LOW (ref 22–32)

## 2022-02-17 LAB — RAPID URINE DRUG SCREEN, HOSP PERFORMED
Amphetamines: NOT DETECTED
Barbiturates: NOT DETECTED
Benzodiazepines: NOT DETECTED
Cocaine: NOT DETECTED
Opiates: POSITIVE — AB
Tetrahydrocannabinol: NOT DETECTED

## 2022-02-17 LAB — PROTIME-INR
INR: 1.1 (ref 0.8–1.2)
Prothrombin Time: 14.2 seconds (ref 11.4–15.2)

## 2022-02-17 LAB — SURGICAL PCR SCREEN
MRSA, PCR: NEGATIVE
Staphylococcus aureus: NEGATIVE

## 2022-02-17 LAB — GLUCOSE, CAPILLARY
Glucose-Capillary: 198 mg/dL — ABNORMAL HIGH (ref 70–99)
Glucose-Capillary: 174 mg/dL — ABNORMAL HIGH (ref 70–99)

## 2022-02-17 LAB — TYPE AND SCREEN
ABO/RH(D): A POS
Antibody Screen: NEGATIVE

## 2022-02-17 LAB — LACTIC ACID, PLASMA
Lactic Acid, Venous: 2.1 mmol/L (ref 0.5–1.9)
Lactic Acid, Venous: 1.3 mmol/L (ref 0.5–1.9)

## 2022-02-17 LAB — ETHANOL: Alcohol, Ethyl (B): <10 mg/dL (ref <10)

## 2022-02-17 LAB — RESP PANEL BY RT-PCR (FLU A&B, COVID) ARPGX2
Influenza A by PCR: NEGATIVE
Influenza B by PCR: NEGATIVE
SARS Coronavirus 2 by RT PCR: NEGATIVE

## 2022-02-17 SURGERY — REPAIR, LACERATION, 2 OR MORE
Anesthesia: General | Site: Face | Laterality: Left

## 2022-02-17 MED ORDER — ADULT MULTIVITAMIN W/MINERALS CH
1.0000 | ORAL_TABLET | Freq: Every day | ORAL | Status: DC
  Administered 2022-02-17 – 2022-03-04 (×16): 1 via ORAL
  Filled 2022-02-17 (×16): qty 1

## 2022-02-17 MED ORDER — FOLIC ACID 1 MG PO TABS
1.0000 mg | ORAL_TABLET | Freq: Every day | ORAL | Status: DC
  Administered 2022-02-17 – 2022-03-04 (×16): 1 mg via ORAL
  Filled 2022-02-17 (×16): qty 1

## 2022-02-17 MED ORDER — ETOMIDATE 2 MG/ML IV SOLN
INTRAVENOUS | Status: DC | PRN
  Administered 2022-02-17: 12 mg via INTRAVENOUS

## 2022-02-17 MED ORDER — SODIUM CHLORIDE 0.9 % IV SOLN
INTRAVENOUS | Status: DC | PRN
  Administered 2022-02-17: 500 mL

## 2022-02-17 MED ORDER — CHLORHEXIDINE GLUCONATE CLOTH 2 % EX PADS: 6.0000 | MEDICATED_PAD | Freq: Once | CUTANEOUS | Status: DC

## 2022-02-17 MED ORDER — BUPROPION HCL ER (XL) 150 MG PO TB24
300.0000 mg | ORAL_TABLET | Freq: Every day | ORAL | Status: DC
  Administered 2022-02-18 – 2022-03-04 (×15): 300 mg via ORAL
  Filled 2022-02-17 (×13): qty 2
  Filled 2022-02-17: qty 1
  Filled 2022-02-17: qty 2

## 2022-02-17 MED ORDER — SODIUM CHLORIDE 0.9 % IV SOLN: Freq: Once | INTRAVENOUS | Status: AC

## 2022-02-17 MED ORDER — HYDROMORPHONE HCL 1 MG/ML IJ SOLN
1.0000 mg | Freq: Once | INTRAMUSCULAR | Status: AC
  Administered 2022-02-17: 1 mg via INTRAVENOUS
  Filled 2022-02-17: qty 1

## 2022-02-17 MED ORDER — LIDOCAINE-EPINEPHRINE 1 %-1:100000 IJ SOLN
INTRAMUSCULAR | Status: AC
  Filled 2022-02-17: qty 1

## 2022-02-17 MED ORDER — FENTANYL CITRATE (PF) 100 MCG/2ML IJ SOLN: 25.0000 ug | INTRAMUSCULAR | Status: DC | PRN

## 2022-02-17 MED ORDER — CEFAZOLIN SODIUM-DEXTROSE 2-4 GM/100ML-% IV SOLN
2.0000 g | Freq: Once | INTRAVENOUS | Status: AC
  Administered 2022-02-17: 2 g via INTRAVENOUS
  Filled 2022-02-17: qty 100

## 2022-02-17 MED ORDER — SODIUM CHLORIDE 0.9 % IV SOLN: Freq: Once | INTRAVENOUS | Status: DC

## 2022-02-17 MED ORDER — LACTATED RINGERS IV BOLUS
1000.0000 mL | Freq: Once | INTRAVENOUS | Status: AC
  Administered 2022-02-17: 1000 mL via INTRAVENOUS

## 2022-02-17 MED ORDER — TETANUS-DIPHTH-ACELL PERTUSSIS 5-2.5-18.5 LF-MCG/0.5 IM SUSY
0.5000 mL | PREFILLED_SYRINGE | Freq: Once | INTRAMUSCULAR | Status: AC
  Administered 2022-02-17: 0.5 mL via INTRAMUSCULAR
  Filled 2022-02-17: qty 0.5

## 2022-02-17 MED ORDER — INSULIN ASPART 100 UNIT/ML IJ SOLN
0.0000 [IU] | Freq: Three times a day (TID) | INTRAMUSCULAR | Status: DC
  Administered 2022-02-18: 5 [IU] via SUBCUTANEOUS
  Administered 2022-02-18 – 2022-02-19 (×3): 2 [IU] via SUBCUTANEOUS
  Administered 2022-02-20 – 2022-02-23 (×5): 1 [IU] via SUBCUTANEOUS
  Administered 2022-02-23: 3 [IU] via SUBCUTANEOUS
  Administered 2022-02-24 – 2022-02-26 (×3): 1 [IU] via SUBCUTANEOUS
  Administered 2022-02-27: 2 [IU] via SUBCUTANEOUS
  Administered 2022-02-27: 1 [IU] via SUBCUTANEOUS
  Administered 2022-02-28: 2 [IU] via SUBCUTANEOUS
  Administered 2022-03-01: 3 [IU] via SUBCUTANEOUS
  Administered 2022-03-02 (×2): 1 [IU] via SUBCUTANEOUS
  Administered 2022-03-02: 2 [IU] via SUBCUTANEOUS
  Administered 2022-03-03 (×2): 1 [IU] via SUBCUTANEOUS
  Administered 2022-03-03: 2 [IU] via SUBCUTANEOUS
  Administered 2022-03-04: 3 [IU] via SUBCUTANEOUS

## 2022-02-17 MED ORDER — OXYCODONE HCL 5 MG PO TABS
5.0000 mg | ORAL_TABLET | Freq: Four times a day (QID) | ORAL | Status: DC | PRN
  Administered 2022-02-18 – 2022-03-03 (×12): 5 mg via ORAL
  Filled 2022-02-17 (×12): qty 1

## 2022-02-17 MED ORDER — ROCURONIUM BROMIDE 10 MG/ML (PF) SYRINGE
PREFILLED_SYRINGE | INTRAVENOUS | Status: DC | PRN
  Administered 2022-02-17: 30 mg via INTRAVENOUS

## 2022-02-17 MED ORDER — PHENYLEPHRINE 80 MCG/ML (10ML) SYRINGE FOR IV PUSH (FOR BLOOD PRESSURE SUPPORT)
PREFILLED_SYRINGE | INTRAVENOUS | Status: DC | PRN
  Administered 2022-02-17: 160 ug via INTRAVENOUS

## 2022-02-17 MED ORDER — FENTANYL CITRATE (PF) 250 MCG/5ML IJ SOLN
INTRAMUSCULAR | Status: AC
  Filled 2022-02-17: qty 5

## 2022-02-17 MED ORDER — SERTRALINE HCL 100 MG PO TABS
200.0000 mg | ORAL_TABLET | Freq: Every day | ORAL | Status: DC
  Administered 2022-02-18 – 2022-02-25 (×8): 200 mg via ORAL
  Filled 2022-02-17 (×5): qty 2
  Filled 2022-02-17: qty 4
  Filled 2022-02-17 (×2): qty 2

## 2022-02-17 MED ORDER — INSULIN ASPART 100 UNIT/ML IJ SOLN
0.0000 [IU] | Freq: Every day | INTRAMUSCULAR | Status: DC
  Administered 2022-02-18 – 2022-03-03 (×2): 2 [IU] via SUBCUTANEOUS

## 2022-02-17 MED ORDER — SODIUM CHLORIDE 0.9 % IV SOLN
2.0000 g | INTRAVENOUS | Status: DC
  Administered 2022-02-17 – 2022-02-19 (×3): 2 g via INTRAVENOUS
  Filled 2022-02-17 (×4): qty 20

## 2022-02-17 MED ORDER — SODIUM CHLORIDE 0.9 % IV BOLUS
1000.0000 mL | Freq: Once | INTRAVENOUS | Status: AC
  Administered 2022-02-17 (×2): 1000 mL via INTRAVENOUS

## 2022-02-17 MED ORDER — NOREPINEPHRINE BITARTRATE 1 MG/ML IV SOLN: 2.0000 ug/kg/min | Freq: Once | INTRAVENOUS | Status: DC

## 2022-02-17 MED ORDER — ONDANSETRON HCL 4 MG/2ML IJ SOLN
INTRAMUSCULAR | Status: AC
  Filled 2022-02-17: qty 2

## 2022-02-17 MED ORDER — CEFAZOLIN SODIUM-DEXTROSE 2-4 GM/100ML-% IV SOLN: 2.0000 g | INTRAVENOUS | Status: DC

## 2022-02-17 MED ORDER — NOREPINEPHRINE 4 MG/250ML-% IV SOLN
2.0000 ug/min | INTRAVENOUS | Status: DC
  Administered 2022-02-17: 2 ug/min via INTRAVENOUS
  Administered 2022-02-17: 8 ug/min via INTRAVENOUS
  Filled 2022-02-17 (×2): qty 250

## 2022-02-17 MED ORDER — SODIUM CHLORIDE 0.9 % IV SOLN: 250.0000 mL | INTRAVENOUS | Status: DC | PRN

## 2022-02-17 MED ORDER — BUPIVACAINE-EPINEPHRINE (PF) 0.25% -1:200000 IJ SOLN
INTRAMUSCULAR | Status: AC
  Filled 2022-02-17: qty 30

## 2022-02-17 MED ORDER — ACETAMINOPHEN 325 MG PO TABS
650.0000 mg | ORAL_TABLET | Freq: Four times a day (QID) | ORAL | Status: DC | PRN
  Administered 2022-02-18 – 2022-03-03 (×13): 650 mg via ORAL
  Filled 2022-02-17 (×13): qty 2

## 2022-02-17 MED ORDER — ONDANSETRON HCL 4 MG/2ML IJ SOLN
INTRAMUSCULAR | Status: DC | PRN
  Administered 2022-02-17: 4 mg via INTRAVENOUS

## 2022-02-17 MED ORDER — ROSUVASTATIN CALCIUM 20 MG PO TABS
20.0000 mg | ORAL_TABLET | Freq: Every day | ORAL | Status: DC
  Administered 2022-02-18: 20 mg via ORAL
  Filled 2022-02-17: qty 1

## 2022-02-17 MED ORDER — INSULIN ASPART 100 UNIT/ML IJ SOLN
INTRAMUSCULAR | Status: DC | PRN
  Administered 2022-02-17: 2 [IU] via SUBCUTANEOUS

## 2022-02-17 MED ORDER — ACETAMINOPHEN 650 MG RE SUPP: 650.0000 mg | Freq: Four times a day (QID) | RECTAL | Status: DC | PRN

## 2022-02-17 MED ORDER — BISACODYL 10 MG RE SUPP: 10.0000 mg | Freq: Every day | RECTAL | Status: DC | PRN

## 2022-02-17 MED ORDER — SODIUM CHLORIDE 0.9% FLUSH: 3.0000 mL | Freq: Two times a day (BID) | INTRAVENOUS | Status: DC

## 2022-02-17 MED ORDER — ACETAMINOPHEN 325 MG PO TABS
650.0000 mg | ORAL_TABLET | Freq: Four times a day (QID) | ORAL | Status: DC | PRN
  Administered 2022-02-17: 650 mg via ORAL
  Filled 2022-02-17: qty 2

## 2022-02-17 MED ORDER — CHLORHEXIDINE GLUCONATE CLOTH 2 % EX PADS
6.0000 | MEDICATED_PAD | Freq: Every day | CUTANEOUS | Status: DC
  Administered 2022-02-17 – 2022-03-03 (×15): 6 via TOPICAL

## 2022-02-17 MED ORDER — MIDAZOLAM HCL 2 MG/2ML IJ SOLN
INTRAMUSCULAR | Status: DC | PRN
  Administered 2022-02-17: 1 mg via INTRAVENOUS

## 2022-02-17 MED ORDER — PHENYLEPHRINE 80 MCG/ML (10ML) SYRINGE FOR IV PUSH (FOR BLOOD PRESSURE SUPPORT)
PREFILLED_SYRINGE | INTRAVENOUS | Status: AC
Start: 2022-02-17 — End: ?
  Filled 2022-02-17: qty 20

## 2022-02-17 MED ORDER — GABAPENTIN 300 MG PO CAPS: 600.0000 mg | ORAL_CAPSULE | Freq: Every day | ORAL | Status: DC

## 2022-02-17 MED ORDER — FENTANYL CITRATE (PF) 250 MCG/5ML IJ SOLN
INTRAMUSCULAR | Status: DC | PRN
  Administered 2022-02-17: 50 ug via INTRAVENOUS

## 2022-02-17 MED ORDER — SUCCINYLCHOLINE CHLORIDE 200 MG/10ML IV SOSY
PREFILLED_SYRINGE | INTRAVENOUS | Status: DC | PRN
  Administered 2022-02-17: 120 mg via INTRAVENOUS

## 2022-02-17 MED ORDER — TRAZODONE HCL 50 MG PO TABS: 50.0000 mg | ORAL_TABLET | Freq: Every evening | ORAL | Status: DC | PRN

## 2022-02-17 MED ORDER — BACITRACIN ZINC 500 UNIT/GM EX OINT
TOPICAL_OINTMENT | CUTANEOUS | Status: AC
Start: 2022-02-17 — End: ?
  Filled 2022-02-17: qty 28.35

## 2022-02-17 MED ORDER — ALBUMIN HUMAN 5 % IV SOLN: INTRAVENOUS | Status: DC | PRN

## 2022-02-17 MED ORDER — PANTOPRAZOLE SODIUM 40 MG PO TBEC
40.0000 mg | DELAYED_RELEASE_TABLET | Freq: Every day | ORAL | Status: DC
  Administered 2022-02-18 – 2022-03-04 (×15): 40 mg via ORAL
  Filled 2022-02-17 (×15): qty 1

## 2022-02-17 MED ORDER — THIAMINE HCL 100 MG/ML IJ SOLN: 100.0000 mg | Freq: Every day | INTRAMUSCULAR | Status: DC

## 2022-02-17 MED ORDER — ONDANSETRON HCL 4 MG/2ML IJ SOLN: 4.0000 mg | Freq: Four times a day (QID) | INTRAMUSCULAR | Status: DC | PRN

## 2022-02-17 MED ORDER — SODIUM CHLORIDE 0.9 % IV BOLUS
1000.0000 mL | Freq: Once | INTRAVENOUS | Status: AC
Start: 2022-02-17 — End: 2022-02-17
  Administered 2022-02-17: 1000 mL via INTRAVENOUS

## 2022-02-17 MED ORDER — ETOMIDATE 2 MG/ML IV SOLN
INTRAVENOUS | Status: AC
  Filled 2022-02-17: qty 10

## 2022-02-17 MED ORDER — THIAMINE HCL 100 MG PO TABS
100.0000 mg | ORAL_TABLET | Freq: Every day | ORAL | Status: DC
  Administered 2022-02-17 – 2022-02-20 (×4): 100 mg via ORAL
  Filled 2022-02-17 (×4): qty 1

## 2022-02-17 MED ORDER — ONDANSETRON HCL 4 MG PO TABS: 4.0000 mg | ORAL_TABLET | Freq: Four times a day (QID) | ORAL | Status: DC | PRN

## 2022-02-17 MED ORDER — CEFAZOLIN SODIUM-DEXTROSE 2-3 GM-%(50ML) IV SOLR
INTRAVENOUS | Status: DC | PRN
  Administered 2022-02-17: 2 g via INTRAVENOUS

## 2022-02-17 MED ORDER — EPHEDRINE 5 MG/ML INJ
INTRAVENOUS | Status: AC
Start: 2022-02-17 — End: ?
  Filled 2022-02-17: qty 5

## 2022-02-17 MED ORDER — LORAZEPAM 2 MG/ML IJ SOLN
1.0000 mg | INTRAMUSCULAR | Status: AC | PRN
  Administered 2022-02-20: 3 mg via INTRAVENOUS
  Administered 2022-02-20: 2 mg via INTRAVENOUS
  Filled 2022-02-17: qty 1
  Filled 2022-02-17: qty 2

## 2022-02-17 MED ORDER — MIDAZOLAM HCL 2 MG/2ML IJ SOLN
INTRAMUSCULAR | Status: AC
  Filled 2022-02-17: qty 2

## 2022-02-17 MED ORDER — SODIUM CHLORIDE 0.9 % IV SOLN: 250.0000 mL | INTRAVENOUS | Status: DC

## 2022-02-17 MED ORDER — SYSTANE CONTACTS OP SOLN: Freq: Two times a day (BID) | OPHTHALMIC | Status: DC

## 2022-02-17 MED ORDER — LACTATED RINGERS IV SOLN: INTRAVENOUS | Status: DC | PRN

## 2022-02-17 MED ORDER — LIDOCAINE 2% (20 MG/ML) 5 ML SYRINGE
INTRAMUSCULAR | Status: DC | PRN
  Administered 2022-02-17: 100 mg via INTRAVENOUS

## 2022-02-17 MED ORDER — SUGAMMADEX SODIUM 200 MG/2ML IV SOLN
INTRAVENOUS | Status: DC | PRN
  Administered 2022-02-17: 200 mg via INTRAVENOUS

## 2022-02-17 MED ORDER — 0.9 % SODIUM CHLORIDE (POUR BTL) OPTIME
TOPICAL | Status: DC | PRN
  Administered 2022-02-17: 1000 mL

## 2022-02-17 MED ORDER — SODIUM CHLORIDE 0.9% FLUSH: 3.0000 mL | INTRAVENOUS | Status: DC | PRN

## 2022-02-17 MED ORDER — ONDANSETRON HCL 4 MG/2ML IJ SOLN
4.0000 mg | Freq: Once | INTRAMUSCULAR | Status: AC
  Administered 2022-02-17: 4 mg via INTRAVENOUS

## 2022-02-17 MED ORDER — LIDOCAINE-EPINEPHRINE 1 %-1:100000 IJ SOLN
INTRAMUSCULAR | Status: DC | PRN
  Administered 2022-02-17: 4 mL

## 2022-02-17 MED ORDER — LORAZEPAM 1 MG PO TABS
1.0000 mg | ORAL_TABLET | ORAL | Status: AC | PRN
  Administered 2022-02-17 – 2022-02-18 (×2): 1 mg via ORAL
  Administered 2022-02-19 (×2): 3 mg via ORAL
  Administered 2022-02-19: 2 mg via ORAL
  Administered 2022-02-20: 3 mg via ORAL
  Filled 2022-02-17: qty 2
  Filled 2022-02-17 (×2): qty 3
  Filled 2022-02-17: qty 1
  Filled 2022-02-17 (×2): qty 3
  Filled 2022-02-17: qty 1

## 2022-02-17 MED ORDER — HEPARIN SODIUM (PORCINE) 5000 UNIT/ML IJ SOLN
5000.0000 [IU] | Freq: Three times a day (TID) | INTRAMUSCULAR | Status: DC
  Administered 2022-02-18 – 2022-03-04 (×41): 5000 [IU] via SUBCUTANEOUS
  Filled 2022-02-17 (×42): qty 1

## 2022-02-17 MED ORDER — POLYETHYLENE GLYCOL 3350 17 G PO PACK
17.0000 g | PACK | Freq: Every day | ORAL | Status: DC | PRN
Start: 2022-02-17 — End: 2022-02-24

## 2022-02-17 MED ORDER — DEXAMETHASONE SODIUM PHOSPHATE 10 MG/ML IJ SOLN
INTRAMUSCULAR | Status: DC | PRN
  Administered 2022-02-17: 5 mg via INTRAVENOUS

## 2022-02-17 MED ORDER — FENTANYL CITRATE PF 50 MCG/ML IJ SOSY
25.0000 ug | PREFILLED_SYRINGE | Freq: Once | INTRAMUSCULAR | Status: AC
  Administered 2022-02-17: 25 ug via INTRAVENOUS
  Filled 2022-02-17: qty 1

## 2022-02-17 MED ORDER — PHENYLEPHRINE HCL-NACL 20-0.9 MG/250ML-% IV SOLN
INTRAVENOUS | Status: DC | PRN
  Administered 2022-02-17: 40 ug/min via INTRAVENOUS

## 2022-02-17 SURGICAL SUPPLY — 62 items
BNDG CONFORM 2 STRL LF (GAUZE/BANDAGES/DRESSINGS) IMPLANT
BNDG GAUZE ELAST 4 BULKY (GAUZE/BANDAGES/DRESSINGS) IMPLANT
CANISTER SUCT 3000ML PPV (MISCELLANEOUS) IMPLANT
CATH ROBINSON RED A/P 16FR (CATHETERS) IMPLANT
CLEANER TIP ELECTROSURG 2X2 (MISCELLANEOUS) ×3 IMPLANT
CNTNR URN SCR LID CUP LEK RST (MISCELLANEOUS) ×2 IMPLANT
CONT SPEC 4OZ STRL OR WHT (MISCELLANEOUS) ×2
COVER SURGICAL LIGHT HANDLE (MISCELLANEOUS) ×3 IMPLANT
DRAIN PENROSE 1/4X12 LTX STRL (WOUND CARE) IMPLANT
DRAPE ORTHO SPLIT 77X108 STRL (DRAPES) ×2
DRAPE SURG ORHT 6 SPLT 77X108 (DRAPES) IMPLANT
DRSG CUTIMED SORBACT 7X9 (GAUZE/BANDAGES/DRESSINGS) ×1 IMPLANT
DRSG EMULSION OIL 3X3 NADH (GAUZE/BANDAGES/DRESSINGS) IMPLANT
ELECT COATED BLADE 2.86 ST (ELECTRODE) ×4 IMPLANT
ELECT NDL BLADE 2-5/6 (NEEDLE) IMPLANT
ELECT NDL TIP 2.8 STRL (NEEDLE) IMPLANT
ELECT NEEDLE BLADE 2-5/6 (NEEDLE) ×2 IMPLANT
ELECT NEEDLE TIP 2.8 STRL (NEEDLE) IMPLANT
ELECT REM PT RETURN 9FT ADLT (ELECTROSURGICAL) ×2
ELECTRODE REM PT RTRN 9FT ADLT (ELECTROSURGICAL) ×2 IMPLANT
GAUZE 4X4 16PLY ~~LOC~~+RFID DBL (SPONGE) IMPLANT
GAUZE SPONGE 2X2 8PLY STRL LF (GAUZE/BANDAGES/DRESSINGS) IMPLANT
GAUZE SPONGE 4X4 12PLY STRL (GAUZE/BANDAGES/DRESSINGS) IMPLANT
GLOVE BIO SURGEON STRL SZ 6.5 (GLOVE) ×3 IMPLANT
GOWN STRL REUS W/ TWL LRG LVL3 (GOWN DISPOSABLE) ×4 IMPLANT
GOWN STRL REUS W/TWL LRG LVL3 (GOWN DISPOSABLE) ×4
GRAFT MYRIAD 3 LAYER 5X5 (Graft) ×1 IMPLANT
KIT BASIN OR (CUSTOM PROCEDURE TRAY) ×3 IMPLANT
KIT TURNOVER KIT B (KITS) ×3 IMPLANT
NDL 25GX 5/8IN NON SAFETY (NEEDLE) IMPLANT
NEEDLE 25GX 5/8IN NON SAFETY (NEEDLE) IMPLANT
NS IRRIG 1000ML POUR BTL (IV SOLUTION) ×3 IMPLANT
PAD ARMBOARD 7.5X6 YLW CONV (MISCELLANEOUS) ×6 IMPLANT
PENCIL BUTTON HOLSTER BLD 10FT (ELECTRODE) ×3 IMPLANT
POWDER MYRIAD MORCELLS 500MG (Miscellaneous) ×1 IMPLANT
SPONGE GAUZE 2X2 STER 10/PKG (GAUZE/BANDAGES/DRESSINGS) ×1
SUT CHROMIC 4 0 P 3 18 (SUTURE) ×3 IMPLANT
SUT ETHILON 4 0 PS 2 18 (SUTURE) ×3 IMPLANT
SUT ETHILON 5 0 P 3 18 (SUTURE) ×2
SUT MNCRL 6-0 UNDY P1 1X18 (SUTURE) IMPLANT
SUT MON AB 5-0 P3 18 (SUTURE) ×1 IMPLANT
SUT MONOCRYL 6-0 P1 1X18 (SUTURE) ×4
SUT NYLON ETHILON 5-0 P-3 1X18 (SUTURE) ×2 IMPLANT
SUT SILK 4 0 (SUTURE) ×2
SUT SILK 4-0 18XBRD TIE 12 (SUTURE) ×2 IMPLANT
SUT SILK 5 0 TF 18 (SUTURE) ×1 IMPLANT
SUT VIC AB 4-0 PS2 27 (SUTURE) ×1 IMPLANT
SUT VIC AB 5-0 P-3 18X BRD (SUTURE) IMPLANT
SUT VIC AB 5-0 P3 18 (SUTURE) ×2
SUT VIC AB 5-0 PS2 18 (SUTURE) ×3 IMPLANT
SUT VICRYL 6 0 P 1 18 (SUTURE) ×3 IMPLANT
SWAB COLLECTION DEVICE MRSA (MISCELLANEOUS) IMPLANT
SWAB CULTURE ESWAB REG 1ML (MISCELLANEOUS) IMPLANT
SYR BULB IRRIG 60ML STRL (SYRINGE) ×1 IMPLANT
SYR TB 1ML LUER SLIP (SYRINGE) IMPLANT
TAPE CLOTH 4X10 WHT NS (GAUZE/BANDAGES/DRESSINGS) ×1 IMPLANT
TOWEL GREEN STERILE (TOWEL DISPOSABLE) ×3 IMPLANT
TOWEL GREEN STERILE FF (TOWEL DISPOSABLE) ×3 IMPLANT
TRAY ENT MC OR (CUSTOM PROCEDURE TRAY) ×3 IMPLANT
TUBE CONNECTING 12X1/4 (SUCTIONS) ×1 IMPLANT
WATER STERILE IRR 1000ML POUR (IV SOLUTION) ×3 IMPLANT
YANKAUER SUCT BULB TIP NO VENT (SUCTIONS) ×1 IMPLANT

## 2022-02-17 NOTE — Progress Notes (Signed)
Inpatient Diabetes Program Recommendations  AACE/ADA: New Consensus Statement on Inpatient Glycemic Control (2015)  Target Ranges:  Prepandial:   less than 140 mg/dL      Peak postprandial:   less than 180 mg/dL (1-2 hours)      Critically ill patients:  140 - 180 mg/dL   Lab Results  Component Value Date   GLUCAP 141 (H) 11/11/2021   HGBA1C 7.9 (H) 03/11/2020    Review of Glycemic Control  Diabetes history: DM2 Outpatient Diabetes medications: Humalog sliding? Lantus 50 units-not taking Current orders for Inpatient glycemic control: Novolog 0-9 units TID and 0-5 units QHS  Received referral for DM2.  Currently being transferred from [facility] to [place].  Please obtain current A1C.  Will follow.  Thank you, Reche Dixon, MSN, [facility] Coordinator Inpatient Diabetes Program [phone] (team pager from 8a-5p)

## 2022-02-17 NOTE — Progress Notes (Signed)
[facility] Progress Note Patient Name: [name] DOB: May 10, 1951 MRN: [id number]   Date of Service  02/17/2022  HPI/Events of Note  Patient is s/p repair of a facial laceration, bedside RN asking if she can be started back on a diet.  eICU Interventions  Bedside RN instructed to confirm with plastic surgery attending physician that patient can resume PO intake, and if so to start a mechanically soft diet tonight.        Kerry Kass Lavan Imes 02/17/2022, 8:56 PM

## 2022-02-17 NOTE — ED Triage Notes (Signed)
Patient to ED after she reports that she fell on a dresser with glasses on. Noted with large wound to left side of face. States that she has been off balance for the past two weeks. States that she did not pass out this am.

## 2022-02-17 NOTE — Anesthesia Preprocedure Evaluation (Signed)
Anesthesia Evaluation  Patient identified by MRN, date of birth, ID band Patient awake    Reviewed: Allergy & Precautions, NPO status , Patient's Chart, lab work & pertinent test results  Airway Mallampati: II  TM Distance: >3 FB     Dental   Pulmonary neg pulmonary ROS,    breath sounds clear to auscultation       Cardiovascular hypertension,  Rhythm:Regular Rate:Normal     Neuro/Psych PSYCHIATRIC DISORDERS CVA    GI/Hepatic Neg liver ROS, GERD  ,  Endo/Other  diabetes  Renal/GU Renal disease     Musculoskeletal   Abdominal   Peds  Hematology   Anesthesia Other Findings   Reproductive/Obstetrics                             Anesthesia Physical Anesthesia Plan  ASA: 3  Anesthesia Plan: General   Post-op Pain Management:    Induction: Intravenous  PONV Risk Score and Plan: 3 and Ondansetron and Treatment may vary due to age or medical condition  Airway Management Planned: Oral ETT  Additional Equipment:   Intra-op Plan:   Post-operative Plan: Extubation in OR  Informed Consent: I have reviewed the patients History and Physical, chart, labs and discussed the procedure including the risks, benefits and alternatives for the proposed anesthesia with the patient or authorized representative who has indicated his/her understanding and acceptance.     Dental advisory given  Plan Discussed with: CRNA and Anesthesiologist  Anesthesia Plan Comments:         Anesthesia Quick Evaluation

## 2022-02-17 NOTE — ED Provider Notes (Addendum)
[facility] EMERGENCY DEPARTMENT Provider Note   CSN: [id number] Arrival date & time: 02/17/22  9741     History  Chief Complaint  Patient presents with   Lytle Michaels    [name] is a 71 y.o. female.  HPI Patient presents with concern of facial trauma, head pain, face pain.  She had a fall that occurred about 6 hours prior to my evaluation.  It is unclear why she took as long she did for evaluation.  She notes that she was awake in the night, fell against a bedside piece of furniture.  Since that time she has had pain in the area, no loss of consciousness, no weakness in any extremity.  She states that she is generally well, was in her usual state of health prior to the fall.    Home Medications Prior to Admission medications   Medication Sig Start Date End Date Taking? Authorizing Provider  Artificial Tear Solution (SYSTANE CONTACTS OP) Place 1 drop into both eyes 2 (two) times daily.   Yes [provider]  aspirin EC 81 MG tablet Take 81 mg by mouth daily.   Yes [provider]  buPROPion (WELLBUTRIN XL) 300 MG 24 hr tablet Take 300 mg by mouth daily. 06/19/20  Yes [provider]  BYDUREON BCISE 2 MG/0.85ML AUIJ Inject 2 mg into the skin once a week. 02/02/22  Yes [provider]  carvedilol (COREG) 12.5 MG tablet Take 12.5 mg by mouth 2 (two) times daily. 02/02/22  Yes [provider]  clonazePAM (KLONOPIN) 1 MG tablet Take 1 mg by mouth 2 (two) times daily as needed for anxiety. 03/07/20  Yes [provider]  fluticasone (FLONASE) 50 MCG/ACT nasal spray Place 2 sprays into both nostrils daily. 07/03/21  Yes Melynda Ripple, MD  furosemide (LASIX) 40 MG tablet Take 1 tablet (40 mg total) by mouth daily. 06/06/19  Yes Evans Lance, MD  gabapentin (NEURONTIN) 600 MG tablet Take 600 mg by mouth at bedtime.  11/30/19  Yes [provider]  HYDROcodone-acetaminophen (NORCO) 10-325 MG tablet Take 1-2 tablets by mouth every 6  (six) hours as needed for moderate pain.   Yes [provider]  insulin lispro (HUMALOG KWIKPEN) 100 UNIT/ML KwikPen INJECT UP TO 50 UNITS DAILY Patient taking differently: 5 Units. INJECT UP TO 50 UNITS DAILY - sliding scale 09/21/18  Yes Nida, Marella Chimes, MD  losartan (COZAAR) 25 MG tablet Take 12.5 mg by mouth daily. 06/19/20  Yes [provider]  omeprazole (PRILOSEC) 20 MG capsule Take 20 mg by mouth daily.   Yes [provider]  potassium chloride SA (K-DUR,KLOR-CON) 20 MEQ tablet Take 20 mEq by mouth 2 (two) times daily.   Yes [provider]  rosuvastatin (CRESTOR) 40 MG tablet TAKE ONE (1) TABLET BY MOUTH EVERY DAY (STOP ATORVASTATIN) 01/29/20  Yes Corum, Rex Kras, MD  sertraline (ZOLOFT) 100 MG tablet Take 200 mg by mouth daily. 06/24/20  Yes [provider]  zolpidem (AMBIEN) 10 MG tablet Take 10 mg by mouth at bedtime as needed for sleep.   Yes [provider]  benzonatate (TESSALON) 200 MG capsule Take 1 capsule (200 mg total) by mouth 3 (three) times daily as needed for cough. Patient not taking: Reported on 02/17/2022 07/03/21   Melynda Ripple, MD  LANTUS SOLOSTAR 100 UNIT/ML Solostar Pen INJECT 50 UNITS INTO THE SKIN DAILY AT 10:00PM Patient not taking: Reported on 02/17/2022 09/01/18   Cassandria Anger, MD  ondansetron (  ZOFRAN ODT) 4 MG disintegrating tablet Take 1 tablet (4 mg total) by mouth every 8 (eight) hours as needed for nausea or vomiting. Patient not taking: Reported on 02/17/2022 07/03/21   Melynda Ripple, MD      Allergies    Patient has no known allergies.    Review of Systems   Review of Systems  All other systems reviewed and are negative.  Physical Exam Updated Vital Signs BP (!) 83/48   Pulse 95   Temp 98.2 F (36.8 C) (Oral)   Resp (!) 21   Ht '5\' 1"'$  (1.549 m)   Wt 67.1 kg   SpO2 99%   BMI 27.95 kg/m  Physical Exam Vitals and nursing note reviewed.  Constitutional:      General: She is  not in acute distress.    Appearance: She is well-developed.  HENT:     Head: Normocephalic.   Eyes:     Conjunctiva/sclera: Conjunctivae normal.  Cardiovascular:     Rate and Rhythm: Normal rate and regular rhythm.  Pulmonary:     Effort: Pulmonary effort is normal. No respiratory distress.     Breath sounds: Normal breath sounds. No stridor.  Abdominal:     General: There is no distension.  Musculoskeletal:     Cervical back: No rigidity or tenderness.  Skin:    General: Skin is warm and dry.  Neurological:     Mental Status: She is alert and oriented to person, place, and time.     Cranial Nerves: No cranial nerve deficit.     Comments: Aside from absent tissue in the left midface no obvious neurologic deficiencies  Psychiatric:        Mood and Affect: Mood normal.     ED Results / Procedures / Treatments   Labs (all labs ordered are listed, but only abnormal results are displayed) Labs Reviewed  COMPREHENSIVE METABOLIC PANEL - Abnormal; Notable for the following components:      Result Value   Sodium 132 (*)    CO2 18 (*)    Glucose, Bld 256 (*)    BUN 67 (*)    Creatinine, Ser 5.58 (*)    Calcium 8.8 (*)    AST 162 (*)    ALT 68 (*)    GFR, Estimated 8 (*)    All other components within normal limits  CBC - Abnormal; Notable for the following components:   WBC 13.3 (*)    RBC 3.44 (*)    Hemoglobin 10.8 (*)    HCT 32.8 (*)    All other components within normal limits  LACTIC ACID, PLASMA - Abnormal; Notable for the following components:   Lactic Acid, Venous 2.1 (*)    All other components within normal limits  I-STAT CHEM 8, ED - Abnormal; Notable for the following components:   Sodium 132 (*)    BUN 66 (*)    Creatinine, Ser 6.40 (*)    Glucose, Bld 245 (*)    Calcium, Ion 1.08 (*)    TCO2 17 (*)    Hemoglobin 11.2 (*)    HCT 33.0 (*)    All other components within normal limits  RESP PANEL BY RT-PCR (FLU A&B, COVID) ARPGX2  ETHANOL  PROTIME-INR   URINALYSIS, ROUTINE W REFLEX MICROSCOPIC  SAMPLE TO BLOOD BANK    EKG None  Radiology CT HEAD WO CONTRAST  Result Date: 02/17/2022 CLINICAL DATA:  [place] this morning striking LEFT side of face/head on dresser, LEFT facial pain,  neck pain, blunt facial trauma, denies loss of consciousness. History of cardiomyopathy, hypertension, type II diabetes mellitus EXAM: CT HEAD WITHOUT CONTRAST CT MAXILLOFACIAL WITHOUT CONTRAST CT CERVICAL SPINE WITHOUT CONTRAST TECHNIQUE: Multidetector CT imaging of the head, cervical spine, and maxillofacial structures were performed using the standard protocol without intravenous contrast. Multiplanar CT image reconstructions of the cervical spine and maxillofacial structures were also generated. RADIATION DOSE REDUCTION: This exam was performed according to the departmental dose-optimization program which includes automated exposure control, adjustment of the mA and/or kV according to patient size and/or use of iterative reconstruction technique. COMPARISON:  CT head 03/11/2020 FINDINGS: CT HEAD FINDINGS Brain: Normal ventricular morphology. No midline shift or mass effect. Normal appearance of brain parenchyma. No intracranial hemorrhage, mass lesion, evidence of acute infarction, or extra-axial fluid collection. Vascular: No hyperdense vessels. Atherosclerotic calcifications identified at internal carotid and vertebral arteries at skull base. Skull: Calvaria intact Other: N/A CT MAXILLOFACIAL FINDINGS Osseous: Osseous mineralization normal. Thin calcific density identified anterior to the LEFT inferior orbital rim, question radiopaque foreign body versus tiny fracture fragment; no definite donor site seen. Remaining facial bones intact. TMJ alignment normal. Orbits: Bony orbits intact.  No intraorbital gas or fluid Sinuses: Paranasal sinuses, visualized mastoid air cells, and middle ear cavities clear bilaterally Soft tissues: Soft tissue gas LEFT periorbital extending into  pre maxillary region consistent with large soft tissue laceration. Associated soft tissue swelling and small LEFT frontal scalp hematoma. Remaining soft tissues unremarkable. CT CERVICAL SPINE FINDINGS Alignment: Normal Skull base and vertebrae: Osseous mineralization low normal. Skull base intact. Vertebral body heights maintained. Disc space narrowing C5-C6 with small endplate spurs. No fracture, subluxation or bone destruction. Scattered multilevel facet degenerative changes bilaterally. Soft tissues and spinal canal: Prevertebral soft tissues normal thickness. Atherosclerotic calcification of vertebral arteries at skull base, carotid bifurcations, and proximal RIGHT subclavian artery. Disc levels:  No specific abnormalities Upper chest: Lung apices clear Other: N/A IMPRESSION: No acute intracranial abnormalities. LEFT facial laceration with soft tissue gas LEFT periorbital extending into pre maxillary region. Thin calcific density anterior to the LEFT inferior orbital rim, question radiopaque foreign body versus tiny fracture fragment from LEFT inferior orbital rim of; no definite donor site seen. Scattered multilevel degenerative facet degenerative changes of the cervical spine. Degenerative disc disease changes C5-C6. No acute cervical spine abnormalities. Electronically Signed   By: Lavonia Dana M.D.   On: 02/17/2022 09:33   CT CERVICAL SPINE WO CONTRAST  Result Date: 02/17/2022 CLINICAL DATA:  [place] this morning striking LEFT side of face/head on dresser, LEFT facial pain, neck pain, blunt facial trauma, denies loss of consciousness. History of cardiomyopathy, hypertension, type II diabetes mellitus EXAM: CT HEAD WITHOUT CONTRAST CT MAXILLOFACIAL WITHOUT CONTRAST CT CERVICAL SPINE WITHOUT CONTRAST TECHNIQUE: Multidetector CT imaging of the head, cervical spine, and maxillofacial structures were performed using the standard protocol without intravenous contrast. Multiplanar CT image reconstructions of the  cervical spine and maxillofacial structures were also generated. RADIATION DOSE REDUCTION: This exam was performed according to the departmental dose-optimization program which includes automated exposure control, adjustment of the mA and/or kV according to patient size and/or use of iterative reconstruction technique. COMPARISON:  CT head 03/11/2020 FINDINGS: CT HEAD FINDINGS Brain: Normal ventricular morphology. No midline shift or mass effect. Normal appearance of brain parenchyma. No intracranial hemorrhage, mass lesion, evidence of acute infarction, or extra-axial fluid collection. Vascular: No hyperdense vessels. Atherosclerotic calcifications identified at internal carotid and vertebral arteries at skull base. Skull: Calvaria intact Other: N/A CT  MAXILLOFACIAL FINDINGS Osseous: Osseous mineralization normal. Thin calcific density identified anterior to the LEFT inferior orbital rim, question radiopaque foreign body versus tiny fracture fragment; no definite donor site seen. Remaining facial bones intact. TMJ alignment normal. Orbits: Bony orbits intact.  No intraorbital gas or fluid Sinuses: Paranasal sinuses, visualized mastoid air cells, and middle ear cavities clear bilaterally Soft tissues: Soft tissue gas LEFT periorbital extending into pre maxillary region consistent with large soft tissue laceration. Associated soft tissue swelling and small LEFT frontal scalp hematoma. Remaining soft tissues unremarkable. CT CERVICAL SPINE FINDINGS Alignment: Normal Skull base and vertebrae: Osseous mineralization low normal. Skull base intact. Vertebral body heights maintained. Disc space narrowing C5-C6 with small endplate spurs. No fracture, subluxation or bone destruction. Scattered multilevel facet degenerative changes bilaterally. Soft tissues and spinal canal: Prevertebral soft tissues normal thickness. Atherosclerotic calcification of vertebral arteries at skull base, carotid bifurcations, and proximal RIGHT  subclavian artery. Disc levels:  No specific abnormalities Upper chest: Lung apices clear Other: N/A IMPRESSION: No acute intracranial abnormalities. LEFT facial laceration with soft tissue gas LEFT periorbital extending into pre maxillary region. Thin calcific density anterior to the LEFT inferior orbital rim, question radiopaque foreign body versus tiny fracture fragment from LEFT inferior orbital rim of; no definite donor site seen. Scattered multilevel degenerative facet degenerative changes of the cervical spine. Degenerative disc disease changes C5-C6. No acute cervical spine abnormalities. Electronically Signed   By: Lavonia Dana M.D.   On: 02/17/2022 09:33   CT MAXILLOFACIAL WO CONTRAST  Result Date: 02/17/2022 CLINICAL DATA:  [place] this morning striking LEFT side of face/head on dresser, LEFT facial pain, neck pain, blunt facial trauma, denies loss of consciousness. History of cardiomyopathy, hypertension, type II diabetes mellitus EXAM: CT HEAD WITHOUT CONTRAST CT MAXILLOFACIAL WITHOUT CONTRAST CT CERVICAL SPINE WITHOUT CONTRAST TECHNIQUE: Multidetector CT imaging of the head, cervical spine, and maxillofacial structures were performed using the standard protocol without intravenous contrast. Multiplanar CT image reconstructions of the cervical spine and maxillofacial structures were also generated. RADIATION DOSE REDUCTION: This exam was performed according to the departmental dose-optimization program which includes automated exposure control, adjustment of the mA and/or kV according to patient size and/or use of iterative reconstruction technique. COMPARISON:  CT head 03/11/2020 FINDINGS: CT HEAD FINDINGS Brain: Normal ventricular morphology. No midline shift or mass effect. Normal appearance of brain parenchyma. No intracranial hemorrhage, mass lesion, evidence of acute infarction, or extra-axial fluid collection. Vascular: No hyperdense vessels. Atherosclerotic calcifications identified at internal  carotid and vertebral arteries at skull base. Skull: Calvaria intact Other: N/A CT MAXILLOFACIAL FINDINGS Osseous: Osseous mineralization normal. Thin calcific density identified anterior to the LEFT inferior orbital rim, question radiopaque foreign body versus tiny fracture fragment; no definite donor site seen. Remaining facial bones intact. TMJ alignment normal. Orbits: Bony orbits intact.  No intraorbital gas or fluid Sinuses: Paranasal sinuses, visualized mastoid air cells, and middle ear cavities clear bilaterally Soft tissues: Soft tissue gas LEFT periorbital extending into pre maxillary region consistent with large soft tissue laceration. Associated soft tissue swelling and small LEFT frontal scalp hematoma. Remaining soft tissues unremarkable. CT CERVICAL SPINE FINDINGS Alignment: Normal Skull base and vertebrae: Osseous mineralization low normal. Skull base intact. Vertebral body heights maintained. Disc space narrowing C5-C6 with small endplate spurs. No fracture, subluxation or bone destruction. Scattered multilevel facet degenerative changes bilaterally. Soft tissues and spinal canal: Prevertebral soft tissues normal thickness. Atherosclerotic calcification of vertebral arteries at skull base, carotid bifurcations, and proximal RIGHT subclavian artery. Disc levels:  No specific abnormalities Upper chest: Lung apices clear Other: N/A IMPRESSION: No acute intracranial abnormalities. LEFT facial laceration with soft tissue gas LEFT periorbital extending into pre maxillary region. Thin calcific density anterior to the LEFT inferior orbital rim, question radiopaque foreign body versus tiny fracture fragment from LEFT inferior orbital rim of; no definite donor site seen. Scattered multilevel degenerative facet degenerative changes of the cervical spine. Degenerative disc disease changes C5-C6. No acute cervical spine abnormalities. Electronically Signed   By: Lavonia Dana M.D.   On: 02/17/2022 09:33     Procedures Procedures    Medications Ordered in ED Medications  ondansetron (ZOFRAN) 4 MG/2ML injection (  Not Given 02/17/22 0915)  0.9 %  sodium chloride infusion ( Intravenous Not Given 02/17/22 0954)  sodium chloride 0.9 % bolus 1,000 mL (has no administration in time range)  ceFAZolin (ANCEF) IVPB 2g/100 mL premix (0 g Intravenous Stopped 02/17/22 0851)  fentaNYL (SUBLIMAZE) injection 25 mcg (25 mcg Intravenous Given 02/17/22 0801)  Tdap (BOOSTRIX) injection 0.5 mL (0.5 mLs Intramuscular Given 02/17/22 0803)  0.9 %  sodium chloride infusion (0 mLs Intravenous Stopped 02/17/22 0851)  HYDROmorphone (DILAUDID) injection 1 mg (1 mg Intravenous Given 02/17/22 0905)  ondansetron (ZOFRAN) injection 4 mg (4 mg Intravenous Given 02/17/22 0912)  sodium chloride 0.9 % bolus 1,000 mL (1,000 mLs Intravenous New Bag/Given 02/17/22 9629)    ED Course/ Medical Decision Making/ A&P This patient with a Hx of hypertension, anxiety, presents to the ED for concern of facial trauma, this involves an extensive number of treatment options, and is a complaint that carries with it a high risk of complications and morbidity.    The differential diagnosis includes fracture, infection, intracranial hemorrhage.  With obvious disruption of the muscle, face, nerve system disruption as well   Social Determinants of Health:  Age, history of anxiety  Additional history obtained:  Additional history and/or information obtained from chart review, notable for history of anxiety, depression, pacemaker placement with ongoing monitoring   After the initial evaluation, orders, including: CT fluid resuscitation monitoring labs were initiated.   Patient placed on Cardiac and Pulse-Oximetry Monitors. The patient was maintained on a cardiac monitor.  The cardiac monitored showed an rhythm of 90 sinus normal The patient was also maintained on pulse oximetry. The readings were typically 90% room air normal   On repeat evaluation of  the patient stayed the same  Lab Tests:  I personally interpreted labs.  The pertinent results include: Several minor abnormalities including mild leukocytosis, mild lactic acidosis, patient is found to have notable worsening renal function, criteria for AKI.  Imaging Studies ordered:  I independently visualized and interpreted imaging which showed no acute intracranial pathology, possible left inferior left orbital rim fracture I agree with the radiologist interpretation  Consultations Obtained:  I requested consultation with the plastic surgery and hospitalist,  and discussed lab and imaging findings as well as pertinent plan - they recommend: With Dr. Migdalia Dk, our plastic surgeon plan is for transfer to [facility] for operative repair.  Dispostion / Final MDM:  After consideration of the diagnostic results and the patient's response to treatment, this adult female presents several hours after a fall, has a notable facial defect with no flap, open wound requiring transfer for surgical repair.  Patient also found to have evidence for acute kidney injury, beyond her baseline renal dysfunction.  Patient was hypotensive receiving multiple liters of fluid resuscitation in the ED, no fever, no other complaints, lower suspicion  for sepsis, in spite of her hypotension.  After discussion with plastic surgery, hospitalist team, the patient was transferred for admission for ongoing resuscitation for renal dysfunction, and operative repair of her fascial defect.  Discussing the patient's need for transfer with our hospitalist colleagues, given persistent hypotension, she will start Levophed drip for pressure support, now after 3 L of bolus, will switch to infusion for additional resuscitation of her renal dysfunction.  Final Clinical Impression(s) / ED Diagnoses Final diagnoses:  Fall, initial encounter  AKI (acute kidney injury) ([place])  Avulsion of skin of face, initial encounter   CRITICAL  CARE Performed by: Carmin Muskrat Total critical care time: 45 minutes Critical care time was exclusive of separately billable procedures and treating other patients. Critical care was necessary to treat or prevent imminent or life-threatening deterioration. Critical care was time spent personally by me on the following activities: development of treatment plan with patient and/or surrogate as well as nursing, discussions with consultants, evaluation of patient's response to treatment, examination of patient, obtaining history from patient or surrogate, ordering and performing treatments and interventions, ordering and review of laboratory studies, ordering and review of radiographic studies, pulse oximetry and re-evaluation of patient's condition.    Carmin Muskrat, MD 02/17/22 1046    Carmin Muskrat, MD 02/17/22 1115

## 2022-02-17 NOTE — Anesthesia Postprocedure Evaluation (Signed)
Anesthesia Post Note  Patient: [name]  Procedure(s) Performed: REPAIR LEFT CHEEK COMPLEX LACERATION WITH MYRIAD PLACEMENT (Left: Face)     Patient location during evaluation: PACU Anesthesia Type: General Level of consciousness: awake and alert Pain management: pain level controlled Vital Signs Assessment: post-procedure vital signs reviewed and stable Respiratory status: spontaneous breathing, nonlabored ventilation, respiratory function stable and patient connected to nasal cannula oxygen Cardiovascular status: blood pressure returned to baseline and stable Postop Assessment: no apparent nausea or vomiting Anesthetic complications: no   No notable events documented.  Last Vitals:  Vitals:   02/17/22 2100 02/17/22 2130  BP: (!) 92/49 120/61  Pulse: 95 95  Resp: 17 19  Temp:    SpO2: 97% 96%    Last Pain:  Vitals:   02/17/22 2000  TempSrc:   PainSc: 0-No pain                 Audry Pili

## 2022-02-17 NOTE — H&P (Addendum)
NAME:  [name], MRN:  [id number], DOB:  April 24, 1951, LOS: 0 ADMISSION DATE:  02/17/2022, CONSULTATION DATE:  6/6 REFERRING MD:  Vanita Panda, CHIEF COMPLAINT:  fall   History of Present Illness:  [name], is a 71 y.o. female, who presented to the [facility] with a chief complaint of fall.   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 Denies LOC.  ED course was notable for hypotension (74/43), AKI, 3L IVF was administered, TMAX 100.1 WBC 13.3, lactate 2.1. 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 rate (!) 22, height '5\' 1"'$  (1.549 m), weight 67.1 kg, SpO2 100 %.        Intake/Output Summary (Last 24 hours) at 02/17/2022 1314 Last data filed at 02/17/2022 1119 Gross per 24 hour  Intake 3092 ml  Output --  Net 3092 ml   Filed Weights   02/17/22 0757  Weight: 67.1 kg    Examination: General: In bed, NAD, appears comfortable HEENT: MM pink/moist, anicteric, atraumatic Neuro: RASS 0, PERRL 62m, GCS 15 CV: S1S2, NSR, no m/r/g appreciated PULM:  clear in the upper lobes, clear in the lower lobes, trachea midline, chest expansion symmetric GI: soft, bsx4 active, non-tender   Extremities: warm/dry, no pretibial edema, capillary refill  less than 3 seconds  Skin:  large left facial laceration.  Labs: COVID/FLU neg NA 132 Glucose 256 BUN 67, creat 5.5.9 (Baseline 1.38-1.6) AST 162, Alt 68,  WBC 13.3 HGB 10.8 ETOH less than 10 INR 1.1 Lactic 2.1> UDS + for opiates UA pending  CT head/CSP/maxillofacial: No acute intracranial abnormalties, LEFT facial laceration with soft tissue gas LEFT periorbital extending into pre maxillary region. C5-C6 degenerative disc disease.    Resolved Hospital Problem list     Assessment & Plan:  Shock, hypovolemic ED course was notable for hypotension (74/43), 3L IVF was administered, TMAX 100.1 WBC 13.3, lactate 2.1. ? WBC/temp reactive secondary to trauma. Do not suspect sepsis at this point. On 5 mcg levophed Lactic acidosis, suspect secondary to above -Goal MAP 65 or greater. Levophed ordered. Titrate medication to goal -S/P 3000 ml fluid resusitation. Ordered another 1L IVF -Follow up UC -Trend lactate -Obtain ECHO -Follow up CXR  Left facial laceration Secondary to fall. Head CT negative. -Dr. DMarla Roewith plastics consulted. Plan for OR on 6/6 at 1630. Appreciate assistance -Wound care per plastics  Systolic and diastolic HF s/p pacemaker placement -hold home GDMT and HTN medications -Holding home lasix -Obtain ECHO  AKI HX neurogenic bladder with home cathing, BUN 67, creat 5.5.9 (Baseline 1.38-1.6), suspect prerenal secondary to hypotension/volume loss. -Ensure renal perfusion. Goal MAP 65 or greater. -Avoid neprotoxic drugs as possible. -Strict I&O's -Follow up AM creatinine  Acute blood loss anemia Suspect secondary to fall. -Check CBC now -Transfuse PRBC if  HBG less than 7 -Obtain AM CBC to trend H&H -Monitor for signs of bleeding  DM2 Per DM coordinator note, not talking home lantus -Blood Glucose goal 140-180. -SSI -check A1C  Hyponatremia NA 132 -No free water -Follow up on AM labs  Fall Unclear etiology of fall at this time, unclear  if mechanical, hypoglycemia, medication related -PT/OT -Fall precautions -Check CXR  GERD -PPI  Transaminitis Suspect secondary to hypotension, AST 162, Alt 68,  -Supportive care  Chronic pain UDS positive for opiates -PRN acetaminophen, oxy  Hip pain -Follow up pelvic xray  HX ETOH use -Start CIWA  Depression -Resume wellbutrin, zoloft  Best Practice (right click and "Reselect all SmartList Selections" daily)   Diet/type: NPO DVT prophylaxis: prophylactic heparin  GI prophylaxis: PPI Lines: N/A Foley:  N/A Code Status:  full code Last date of multidisciplinary goals of care discussion [pending]  Labs   CBC: Recent Labs  Lab 02/17/22 0756 02/17/22 0808  WBC 13.3*  --   HGB 10.8* 11.2*  HCT 32.8* 33.0*  MCV 95.3  --   PLT 172  --     Basic Metabolic Panel: Recent Labs  Lab 02/17/22 0756 02/17/22 0808  NA 132* 132*  K 4.3 4.3  CL 99 99  CO2 18*  --   GLUCOSE 256* 245*  BUN 67* 66*  CREATININE 5.58* 6.40*  CALCIUM 8.8*  --    GFR: Estimated Creatinine Clearance: 7.3 mL/min (A) (by C-G formula based on SCr of 6.4 mg/dL (H)). Recent Labs  Lab 02/17/22 0756 02/17/22 0833  WBC 13.3*  --   LATICACIDVEN  --  2.1*    Liver Function Tests: Recent Labs  Lab 02/17/22 0756  AST 162*  ALT 68*  ALKPHOS 111  BILITOT 1.1  PROT 7.3  ALBUMIN 3.7   No results for input(s): LIPASE, AMYLASE in the last 168 hours. No results for input(s): AMMONIA in the last 168 hours.  ABG    Component Value Date/Time   TCO2 17 (L) 02/17/2022 0808     Coagulation Profile: Recent Labs  Lab 02/17/22 0756  INR 1.1    Cardiac Enzymes: No results for input(s): CKTOTAL, CKMB, CKMBINDEX, TROPONINI in the last 168 hours.  HbA1C: Hgb A1c MFr Bld  Date/Time Value Ref Range Status  03/11/2020 05:50 PM 7.9 (H) 4.8 - 5.6 % Final    Comment:    (NOTE)         Prediabetes: 5.7 - 6.4         Diabetes: >6.4         Glycemic control for adults with diabetes: <7.0    08/25/2018 08:14 AM 8.2 (H) 4.8 - 5.6 % Final    Comment:             Prediabetes: 5.7 - 6.4          Diabetes: >6.4          Glycemic control for adults with diabetes: <7.0     CBG: No results for input(s): GLUCAP in the last 168 hours.  Review of Systems:   Positives in bold  Gen: fever, chills, weight change, fatigue, night sweats HEENT:  blurred vision, double vision, hearing loss, tinnitus, sinus congestion, rhinorrhea, sore throat, neck stiffness, dysphagia PULM:  shortness of breath, cough, sputum production, hemoptysis, wheezing CV: chest pain, edema, orthopnea, paroxysmal nocturnal dyspnea, palpitations GI:  abdominal pain, nausea, vomiting, diarrhea, hematochezia, melena, constipation, change in bowel habits GU: dysuria, hematuria, polyuria, oliguria, urethral discharge Endocrine: hot  or cold intolerance, polyuria, polyphagia or appetite change Derm: rash, dry skin, scaling or peeling skin change Heme: easy bruising, bleeding, bleeding gums Neuro: headache, numbness, weakness, slurred speech, loss of memory or consciousness   Past Medical History:  She,  has a past medical history of Anxiety, Cardiomyopathy, Cervical high risk HPV (human papillomavirus) test positive (01/30/2015), Chronic constipation, Chronic lower back pain, Constipation (01/30/2015), Depression, GERD (gastroesophageal reflux disease), Heart failure, HTN (hypertension), Hypercholesteremia, Neurogenic bladder, and Type II diabetes mellitus ([place]).   Surgical History:   Past Surgical History:  Procedure Laterality Date   ACHILLES TENDON SURGERY Right 06/2012   BACK SURGERY  2005   BI-VENTRICULAR IMPLANTABLE CARDIOVERTER DEFIBRILLATOR  (CRT-D)  12-18-2013   upgrade of previously implanted dual chamber pacemaker to MDT CRTD with RV lead revision by Dr Placido Sou PACEMAKER UPGRADE  12/18/2013   w/defibrillator   BIV PACEMAKER GENERATOR CHANGE OUT N/A 12/18/2013   Procedure: BIV PACEMAKER GENERATOR  CHANGE OUT;  Surgeon: Evans Lance, MD;  Location: [facility] CATH LAB;  Service: Cardiovascular;  Laterality: N/A;   BIV PACEMAKER INSERTION CRT-P N/A 02/22/2020   Procedure: DOWNGRADE BIV PACEMAKER INSERTION CRT-P;  Surgeon: Evans Lance, MD;  Location: [facility] CV LAB;  Service: Cardiovascular;  Laterality: N/A;   BREAST BIOPSY Left 1973   benign   BREAST LUMPECTOMY Left [place]   CHOLECYSTECTOMY     GASTROCNEMIUS RECESSION  06/24/2012   Procedure: GASTROCNEMIUS SLIDE;  Surgeon: Colin Rhein, MD;  Location: [facility] ORS;  Service: Orthopedics;  Laterality: Right;   HERNIA REPAIR     LEAD REVISION N/A 12/18/2013   Procedure: LEAD REVISION;  Surgeon: Evans Lance, MD;  Location[facility] CATH LAB;  Service: Cardiovascular;  Laterality: N/A;   PACEMAKER INSERTION  2006   Dual chamber MDT pacemaker implanted - subsequent upgrade to CRTD 12-2013   POSTERIOR LUMBAR FUSION  2006; 2008   SIGMOIDOSCOPY  MAY 2011 w/ PROP   iTCS-->FSIG-poor bowel prep   TUBAL LIGATION  ~ 1984   UPPER GASTROINTESTINAL ENDOSCOPY  MAY 2011 w/ PROP   MILD GASTRITIS 2o ETOH     Social History:   reports that she has never smoked. She has never used smokeless tobacco. She reports current alcohol use. She reports that she does not use drugs.   Family History:  Her family history includes Alzheimer's disease in her mother; Brain cancer in her brother; Cancer in her father and sister; Diabetes in her brother, brother, mother, and sister. There is no history of Colon cancer or Colon polyps.   Allergies No Known Allergies   Home Medications  Prior to Admission medications   Medication Sig Start Date End Date Taking? Authorizing Provider  Artificial Tear Solution (SYSTANE CONTACTS OP) Place 1 drop into both eyes 2 (two) times daily.   Yes [provider]  aspirin EC 81 MG tablet Take 81 mg by mouth daily.   Yes [provider]  buPROPion (WELLBUTRIN XL) 300 MG 24 hr tablet Take 300 mg by  mouth daily. 06/19/20  Yes [provider]  BYDUREON BCISE 2 MG/0.85ML AUIJ Inject 2 mg into the skin once a week. 02/02/22  Yes [provider]  carvedilol (COREG) 12.5 MG tablet Take 12.5 mg by mouth 2 (two) times daily. 02/02/22  Yes [provider]  clonazePAM (KLONOPIN) 1 MG tablet Take 1 mg by mouth 2 (two) times daily as needed for anxiety. 03/07/20  Yes [provider]  fluticasone (FLONASE) 50 MCG/ACT nasal spray Place 2 sprays into both nostrils daily. 07/03/21  Yes Melynda Ripple, MD  furosemide (LASIX) 40 MG tablet Take 1 tablet (40 mg total) by mouth daily. 06/06/19  Yes Evans Lance, MD  gabapentin (NEURONTIN) 600 MG tablet Take 600 mg by mouth at bedtime.  11/30/19  Yes [provider]  HYDROcodone-acetaminophen (NORCO) 10-325 MG tablet Take 1-2 tablets by mouth every 6 (six) hours as needed for moderate pain.   Yes [provider]  insulin lispro (HUMALOG KWIKPEN) 100 UNIT/ML KwikPen INJECT UP TO 50 UNITS DAILY Patient taking differently: 5 Units. INJECT UP TO 50 UNITS DAILY - sliding scale 09/21/18  Yes Nida, Marella Chimes, MD  losartan (COZAAR) 25 MG tablet Take 12.5 mg by mouth daily. 06/19/20  Yes [provider]  omeprazole (PRILOSEC) 20 MG capsule Take 20 mg by mouth daily.   Yes [provider]  potassium chloride SA (K-DUR,KLOR-CON) 20 MEQ tablet Take 20 mEq by mouth 2 (two) times daily.   Yes [provider]  rosuvastatin (CRESTOR) 40 MG tablet TAKE ONE (1) TABLET BY MOUTH EVERY DAY (STOP ATORVASTATIN) 01/29/20  Yes Corum, Rex Kras, MD  sertraline (ZOLOFT) 100 MG tablet Take 200 mg by mouth daily. 06/24/20  Yes [provider]  zolpidem (AMBIEN) 10 MG tablet Take 10 mg by mouth at bedtime as needed for sleep.   Yes [provider]  benzonatate (TESSALON) 200 MG capsule Take 1 capsule (200 mg total) by mouth 3 (three) times daily as needed for cough. Patient not taking: Reported on  02/17/2022 07/03/21   Melynda Ripple, MD  LANTUS SOLOSTAR 100 UNIT/ML Solostar Pen INJECT 50 UNITS INTO THE SKIN DAILY AT 10:00PM Patient not taking: Reported on 02/17/2022 09/01/18   Cassandria Anger, MD  ondansetron (ZOFRAN ODT) 4 MG disintegrating tablet Take 1 tablet (4 mg total) by mouth every 8 (eight) hours as needed for nausea or vomiting. Patient not taking: Reported on 02/17/2022 07/03/21   Melynda Ripple, MD     Critical care time: 15 minutes     Redmond School., MSN, APRN, AGACNP-BC [facility]  02/17/2022 , 3:02 PM  Please see [email or web address] for pager details  If no response, please call [phone] After hours, please call Elink at [phone]

## 2022-02-17 NOTE — Anesthesia Procedure Notes (Signed)
Procedure Name: Intubation Date/Time: 02/17/2022 4:52 PM Performed by: Rande Brunt, CRNA Pre-anesthesia Checklist: Patient identified, Emergency Drugs available, Suction available and Patient being monitored Patient Re-evaluated:Patient Re-evaluated prior to induction Oxygen Delivery Method: Circle System Utilized Preoxygenation: Pre-oxygenation with 100% oxygen Induction Type: IV induction Ventilation: Mask ventilation without difficulty Laryngoscope Size: Glidescope and 3 Grade View: Grade I Tube type: Oral Tube size: 7.0 mm Number of attempts: 2 Airway Equipment and Method: Stylet and Oral airway Placement Confirmation: ETT inserted through vocal cords under direct vision, positive ETCO2 and breath sounds checked- equal and bilateral Secured at: 22 cm Tube secured with: Tape Dental Injury: Teeth and Oropharynx as per pre-operative assessment  Comments: Attempted to intubated with Sabra Heck 2 with grade 2 view and patient very dry mouth with thick dry secretions; grade 1 view with glidescope

## 2022-02-17 NOTE — Transfer of Care (Signed)
Immediate Anesthesia Transfer of Care Note  Patient: [name]  Procedure(s) Performed: REPAIR LEFT CHEEK COMPLEX LACERATION WITH MYRIAD PLACEMENT (Left: Face)  Patient Location: PACU  Anesthesia Type:General  Level of Consciousness: drowsy, patient cooperative and responds to stimulation  Airway & Oxygen Therapy: Patient Spontanous Breathing and Patient connected to face mask oxygen  Post-op Assessment: Report given to RN, Post -op Vital signs reviewed and stable and Patient moving all extremities X 4  Post vital signs: Reviewed and stable  Last Vitals:  Vitals Value Taken Time  BP 105/64 02/17/22 1829  Temp    Pulse 104 02/17/22 1832  Resp 26 02/17/22 1832  SpO2 100 % 02/17/22 1832  Vitals shown include unvalidated device data.  Last Pain:  Vitals:   02/17/22 1400  TempSrc: Oral  PainSc: 0-No pain         Complications: No notable events documented.

## 2022-02-17 NOTE — H&P (Signed)
Patient Demographics:    [name], is a 71 y.o. female  MRN: [id number]   DOB - 03/28/1951  Admit Date - 02/17/2022  Outpatient Primary MD for the patient is Celene Squibb, MD   Assessment & Plan:   Assessment and Plan:  1)Shock--??  Septic shock versus shock secondary to hypovolemia in the setting of facial laceration with blood loss and  dehydration - need to rule out sepsis given UA findings -Systolic blood pressure was in the 82U with diastolics in the 23N, patient received a total of 3.5 L of IV fluids--- BP initially improved but then patient became hypotensive again with systolic blood pressure in the 70s to 36R and diastolic blood pressure in the 40s to 50s -Patient was started on IV Levophed, hypotension persisted so Levophed was titrated up to 5 mcg----IV fluids were continued -Discussed with ICU/PCCM attending Dr. 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 due to dehydration/hypotension suspect some degree of ATN -   creatinine on admission= 5.58, BUN 67 -baseline creatinine = 1.4 to 1.6     -Continue aggressive IV fluids -renally adjust medications, avoid nephrotoxic agents / dehydration  / hypotension  3)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, down graded to a biv PPM (medtronic DDD Biv PPM )--given episode of syncope/fainting with facial laceration -Patient will benefit from device interrogation -Defer to PCCM  team to decide if official cardiology consult needed Echo from 02/01/2020 with EF of 55 to 60% which is up from EF of 15 to 20% previously -Consider repeat echo given episode of syncope/fainting with facial laceration--defer decision to PCCM team  4) hyponatremia--sodium is 132, suspect dehydration related cannot rule out component of beer potomania -Continue IV fluids  5) alcohol abuse---  patient with tremors, Lorazepam per CIWA protocol  6)Elevated LFTs--- AST 162, ALT 68, T. bili 1.1--- suspect this is secondary to shock liver in the setting of hypotension  7) chronic urinary retention/neurogenic bladder--- PTA on home patient self catheterizes frequently -Foley placed at this time due to urinary retention and hemodynamic instability with need to have accurate ins and O's  8)DM2--PTA was on insulin at home -Check A1c Use Novolog/Humalog Sliding scale insulin with Accu-Cheks/Fingersticks as ordered   9) possible left inferior orbital rim fracture---CT head, CT maxillofacial and CT C-spine without acute intracranial injury or C-spine fractures, left facial soft tissue gas/periorbital swelling noted, there is a possible tiny fracture fragment from the left inferior orbital rim or radiopaque foreign body -- Defer further management to PCCM attending  10) nausea ,vomiting and diarrhea----probably contributed to patient's dehydration, hypotension and AKI -  Hydrate aggressively -Antiemetics as ordered -Stool for C. difficile and GI pathogen  Disposition--due to persistent hypotension despite IV fluids requiring IV pressors and the need for plastic surgery for facial laceration repair patient is being transferred to [facility], ICU -Initially paged PCU pager [phone]--- spoke with NP Richardson Landry Minor, he requested that I page on-call attending Dr. Tacy Learn through [place] attempted to get hold of PCCM attending Dr. Tacy Learn, initially unsuccessfully -After back-and-forth I was able to  speak with Dr. Tacy Learn--- -Dr. Tacy Learn then requested that I asked Dr. Halford Chessman to see the patient if possible while patient was still at [facility], ED -Dr. Halford Chessman is seeing patients in the clinic and unable to come to the ED to see patient at this time -After back-and-forth--patient accepted by Dr. Tacy Learn to PCCM service at [place] Performed by: Roxan Hockey   Total critical care time: 64 minutes  Critical care time was exclusive of separately billable procedures and treating other patients.  Critical care was necessary to treat or prevent imminent or life-threatening deterioration.  -Persistent hypotension/shock with worsening renal function and elevated LFTs despite aggressive IV fluids requiring persistent IV Levophed infusion  Critical care was time spent personally by me on the following activities: development of treatment plan with patient and/or surrogate as well as nursing, discussions with consultants, evaluation of patient's response to treatment, examination of patient, obtaining history from patient or surrogate, ordering and performing treatments and interventions, ordering and review of laboratory studies, ordering and review of radiographic studies, pulse oximetry and re-evaluation of patient's condition.  Status is: Inpatient  Remains inpatient appropriate because:   Dispo: The patient is from: Home              Anticipated d/c is to: Home              Anticipated d/c date is: 3 days              Patient currently is not medically stable to d/c. Barriers: Not Clinically Stable-    With History of - Reviewed by me  Past Medical History:  Diagnosis Date   Anxiety    Cardiomyopathy    Cervical high risk HPV (human papillomavirus) test positive 01/30/2015   Chronic constipation    Chronic lower back pain    Constipation 01/30/2015   Depression    GERD (gastroesophageal reflux disease)    Heart failure    Chronic combined   HTN (hypertension)     Hypercholesteremia    Neurogenic bladder    2008 nerve damage s/p back ssurgery   Type II diabetes mellitus ([place])       Past Surgical History:  Procedure Laterality Date   ACHILLES TENDON SURGERY Right 06/2012   BACK SURGERY  2005   BI-VENTRICULAR IMPLANTABLE CARDIOVERTER DEFIBRILLATOR  (CRT-D)  12-18-2013   upgrade of previously implanted dual chamber pacemaker to MDT CRTD with RV lead revision by Dr Placido Sou PACEMAKER UPGRADE  12/18/2013   w/defibrillator   BIV PACEMAKER GENERATOR CHANGE OUT N/A 12/18/2013   Procedure: BIV PACEMAKER GENERATOR CHANGE OUT;  Surgeon: Evans Lance, MD;  Location: [facility] CATH LAB;  Service: Cardiovascular;  Laterality: N/A;   BIV PACEMAKER INSERTION CRT-P N/A 02/22/2020   Procedure: DOWNGRADE BIV PACEMAKER INSERTION CRT-P;  Surgeon: Evans Lance, MD;  Location: [facility] CV LAB;  Service: Cardiovascular;  Laterality: N/A;   BREAST BIOPSY Left 1973   benign   BREAST LUMPECTOMY Left 1973  CESAREAN SECTION  1981   CHOLECYSTECTOMY     GASTROCNEMIUS RECESSION  06/24/2012   Procedure: GASTROCNEMIUS SLIDE;  Surgeon: Colin Rhein, MD;  Location: [facility] ORS;  Service: Orthopedics;  Laterality: Right;   HERNIA REPAIR     LEAD REVISION N/A 12/18/2013   Procedure: LEAD REVISION;  Surgeon: Evans Lance, MD;  Location: [facility] CATH LAB;  Service: Cardiovascular;  Laterality: N/A;   PACEMAKER INSERTION  2006   Dual chamber MDT pacemaker implanted - subsequent upgrade to CRTD 12-2013   POSTERIOR LUMBAR FUSION  2006; 2008   SIGMOIDOSCOPY  MAY 2011 w/ PROP   iTCS-->FSIG-poor bowel prep   TUBAL LIGATION  ~ 1984   UPPER GASTROINTESTINAL ENDOSCOPY  MAY 2011 w/ PROP   MILD GASTRITIS 2o ETOH   Chief Complaint  Patient presents with   Fall      HPI:    [name]  is a 71 y.o. female with h/o daily alcohol use, DM2 with neurogenic bladder,  h/o combined diastolic and systolic heart failure and LBBB s/p biv ICD, down graded to a biv PPM presents to the ED  with left-sided facial laceration after falling at home about 6 hours prior to arriving to the ED -Patient states that she fell and hit the dresser while she had the glasses on -Her friend (Ms [employer])  is at bedside and says that patient called her to  pick her  up to take her to the hospital this morning due to facial laceration -Patient apparently has had some nausea , vomiting and diarrhea for the last few days -No fever  Or chills at home Patient denies chest pains palpitations prior to falling on heating the face on the dresser -Denies history of seizures -On arrival in the ED patient was found to be hypotensive -Systolic blood pressure was in the 38B with diastolics in the 33O, patient received a total of 3.5 L of IV fluids--- BP initially improved but then patient became hypotensive again with systolic blood pressure in the 70s to 32N and diastolic blood pressure in the 40s to 50s -Patient was started on IV Levophed, hypotension persisted so Levophed was titrated up to 5 mcg----IV fluids were continued -CT head, CT maxillofacial and CT C-spine without acute intracranial injury or C-spine fractures, left facial soft tissue gas/periorbital swelling noted, there is a possible tiny fracture fragment from the left inferior orbital rim or radiopaque foreign body -UDS with opiates otherwise negative the patient did receive opiates here in the ED -Lactic acid 2.1, repeat lactic acid after IV fluids pending -Sodium is 132, creatinine 6.40 baseline creatinine used to be around 1.4 -Glucose 245 -INR 1.1, BAL negative -WBC 13.3 , Hgb 10.8, platelets 172 -AST 162 ALT 68, alk phos 111, T. bili 1.1, anion gap 15 -COVID and influenza negative -UA suggestive of UTI   Review of systems:    In addition to the HPI above,   A full Review of  Systems was done, all other systems reviewed are negative except as noted above in HPI , .    Social History:  Reviewed by me    Social History    Tobacco Use   Smoking status: Never   Smokeless tobacco: Never  Substance Use Topics   Alcohol use: Yes    Alcohol/week: 0.0 standard drinks    Comment: drinks beer or wine three times a week     Family History :  Reviewed by me    Family History  Problem  Relation Age of Onset   Alzheimer's disease Mother    Diabetes Mother    Cancer Father    Cancer Sister        breast   Diabetes Sister    Diabetes Brother    Brain cancer Brother    Diabetes Brother    Colon cancer Neg Hx    Colon polyps Neg Hx     Home Medications:   Prior to Admission medications   Medication Sig Start Date End Date Taking? Authorizing Provider  Artificial Tear Solution (SYSTANE CONTACTS OP) Place 1 drop into both eyes 2 (two) times daily.   Yes [provider]  aspirin EC 81 MG tablet Take 81 mg by mouth daily.   Yes [provider]  buPROPion (WELLBUTRIN XL) 300 MG 24 hr tablet Take 300 mg by mouth daily. 06/19/20  Yes [provider]  BYDUREON BCISE 2 MG/0.85ML AUIJ Inject 2 mg into the skin once a week. 02/02/22  Yes [provider]  carvedilol (COREG) 12.5 MG tablet Take 12.5 mg by mouth 2 (two) times daily. 02/02/22  Yes [provider]  clonazePAM (KLONOPIN) 1 MG tablet Take 1 mg by mouth 2 (two) times daily as needed for anxiety. 03/07/20  Yes [provider]  fluticasone (FLONASE) 50 MCG/ACT nasal spray Place 2 sprays into both nostrils daily. 07/03/21  Yes Melynda Ripple, MD  furosemide (LASIX) 40 MG tablet Take 1 tablet (40 mg total) by mouth daily. 06/06/19  Yes Evans Lance, MD  gabapentin (NEURONTIN) 600 MG tablet Take 600 mg by mouth at bedtime.  11/30/19  Yes [provider]  HYDROcodone-acetaminophen (NORCO) 10-325 MG tablet Take 1-2 tablets by mouth every 6 (six) hours as needed for moderate pain.   Yes [provider]  insulin lispro (HUMALOG KWIKPEN) 100 UNIT/ML KwikPen INJECT UP TO 50 UNITS DAILY Patient  taking differently: 5 Units. INJECT UP TO 50 UNITS DAILY - sliding scale 09/21/18  Yes Nida, Marella Chimes, MD  losartan (COZAAR) 25 MG tablet Take 12.5 mg by mouth daily. 06/19/20  Yes [provider]  omeprazole (PRILOSEC) 20 MG capsule Take 20 mg by mouth daily.   Yes [provider]  potassium chloride SA (K-DUR,KLOR-CON) 20 MEQ tablet Take 20 mEq by mouth 2 (two) times daily.   Yes [provider]  rosuvastatin (CRESTOR) 40 MG tablet TAKE ONE (1) TABLET BY MOUTH EVERY DAY (STOP ATORVASTATIN) 01/29/20  Yes Corum, Rex Kras, MD  sertraline (ZOLOFT) 100 MG tablet Take 200 mg by mouth daily. 06/24/20  Yes [provider]  zolpidem (AMBIEN) 10 MG tablet Take 10 mg by mouth at bedtime as needed for sleep.   Yes [provider]  benzonatate (TESSALON) 200 MG capsule Take 1 capsule (200 mg total) by mouth 3 (three) times daily as needed for cough. Patient not taking: Reported on 02/17/2022 07/03/21   Melynda Ripple, MD  LANTUS SOLOSTAR 100 UNIT/ML Solostar Pen INJECT 50 UNITS INTO THE SKIN DAILY AT 10:00PM Patient not taking: Reported on 02/17/2022 09/01/18   Cassandria Anger, MD  ondansetron (ZOFRAN ODT) 4 MG disintegrating tablet Take 1 tablet (4 mg total) by mouth every 8 (eight) hours as needed for nausea or vomiting. Patient not taking: Reported on 02/17/2022 07/03/21   Melynda Ripple, MD     Allergies:    No Known Allergies   Physical Exam:   Vitals  Blood pressure (!) 111/45, pulse (!) 109, temperature 100.1 F (37.8 C), temperature source Oral,  resp. rate (!) 22, height '5\' 1"'  (1.549 m), weight 67.1 kg, SpO2 100 %.  Temp:  [98.2 F (36.8 C)-100.1 F (37.8 C)] 100.1 F (37.8 C) (06/06 1237) Pulse Rate:  [95-109] 109 (06/06 1235) Resp:  [15-30] 22 (06/06 1240) BP: (75-111)/(40-87) 111/45 (06/06 1240) SpO2:  [92 %-100 %] 100 % (06/06 1230) Weight:  [67.1 kg] 67.1 kg (06/06 0757)  Physical Examination: General appearance - alert,  in no  distress  Mental status - alert, oriented to person, place, and time,  Eyes - left periorbital injury, subconjunctival hemorrhage Neck - supple, no JVD elevation , Chest - clear  to auscultation bilaterally, symmetrical air movement,  Heart - S1 and S2 normal, regular  Abdomen - soft, nontender, nondistended, +BS Neurological - screening mental status exam normal, neck supple without rigidity, cranial nerves II through XII intact, DTR's normal and symmetric -Patient with tremors suspect related to DTs Extremities - no pedal edema noted, intact peripheral pulses  Skin -facial laceration   Media Information Document Information  Photos    02/17/2022 11:48  Attached To:  Hospital Encounter on 02/17/22   Source Information  Roxan Hockey, MD  [facility]      Data Review:    CBC Recent Labs  Lab 02/17/22 0756 02/17/22 0808  WBC 13.3*  --   HGB 10.8* 11.2*  HCT 32.8* 33.0*  PLT 172  --   MCV 95.3  --   MCH 31.4  --   MCHC 32.9  --   RDW 14.0  --    ------------------------------------------------------------------------------------------------------------------  Chemistries  Recent Labs  Lab 02/17/22 0756 02/17/22 0808  NA 132* 132*  K 4.3 4.3  CL 99 99  CO2 18*  --   GLUCOSE 256* 245*  BUN 67* 66*  CREATININE 5.58* 6.40*  CALCIUM 8.8*  --   AST 162*  --   ALT 68*  --   ALKPHOS 111  --   BILITOT 1.1  --    ------------------------------------------------------------------------------------------------------------------ estimated creatinine clearance is 7.3 mL/min (A) (by C-G formula based on SCr of 6.4 mg/dL (H)). ------------------------------------------------------------------------------------------------------------------ No results for input(s): TSH, T4TOTAL, T3FREE, THYROIDAB in the last 72 hours.  Invalid input(s): FREET3   Coagulation profile Recent Labs  Lab 02/17/22 0756  INR 1.1    ------------------------------------------------------------------------------------------------------------------- No results for input(s): DDIMER in the last 72 hours. -------------------------------------------------------------------------------------------------------------------  Cardiac Enzymes No results for input(s): CKMB, TROPONINI, MYOGLOBIN in the last 168 hours.  Invalid input(s): CK ------------------------------------------------------------------------------------------------------------------ No results found for: BNP   ---------------------------------------------------------------------------------------------------------------  Urinalysis    Component Value Date/Time   COLORURINE AMBER (A) 03/11/2020 1747   APPEARANCEUR CLEAR 03/11/2020 1747   APPEARANCEUR Clear 02/05/2016 1300   LABSPEC 1.013 03/11/2020 1747   PHURINE 5.0 03/11/2020 1747   GLUCOSEU 50 (A) 03/11/2020 1747   HGBUR NEGATIVE 03/11/2020 1747   BILIRUBINUR NEGATIVE 03/11/2020 1747   BILIRUBINUR negative 09/20/2019 1545   BILIRUBINUR Negative 02/05/2016 1300   KETONESUR NEGATIVE 03/11/2020 1747   PROTEINUR 30 (A) 03/11/2020 1747   UROBILINOGEN 0.2 09/20/2019 1545   UROBILINOGEN 0.2 08/13/2014 1332   NITRITE POSITIVE (A) 03/11/2020 1747   LEUKOCYTESUR SMALL (A) 03/11/2020 1747    ----------------------------------------------------------------------------------------------------------------   Imaging Results:    CT HEAD WO CONTRAST  Result Date: 02/17/2022 CLINICAL DATA:  [place] this morning striking LEFT side of face/head on dresser, LEFT facial pain, neck pain, blunt facial trauma, denies loss of consciousness. History of cardiomyopathy, hypertension, type II diabetes mellitus EXAM: CT HEAD WITHOUT CONTRAST CT  MAXILLOFACIAL WITHOUT CONTRAST CT CERVICAL SPINE WITHOUT CONTRAST TECHNIQUE: Multidetector CT imaging of the head, cervical spine, and maxillofacial structures were performed using the  standard protocol without intravenous contrast. Multiplanar CT image reconstructions of the cervical spine and maxillofacial structures were also generated. RADIATION DOSE REDUCTION: This exam was performed according to the departmental dose-optimization program which includes automated exposure control, adjustment of the mA and/or kV according to patient size and/or use of iterative reconstruction technique. COMPARISON:  CT head 03/11/2020 FINDINGS: CT HEAD FINDINGS Brain: Normal ventricular morphology. No midline shift or mass effect. Normal appearance of brain parenchyma. No intracranial hemorrhage, mass lesion, evidence of acute infarction, or extra-axial fluid collection. Vascular: No hyperdense vessels. Atherosclerotic calcifications identified at internal carotid and vertebral arteries at skull base. Skull: Calvaria intact Other: N/A CT MAXILLOFACIAL FINDINGS Osseous: Osseous mineralization normal. Thin calcific density identified anterior to the LEFT inferior orbital rim, question radiopaque foreign body versus tiny fracture fragment; no definite donor site seen. Remaining facial bones intact. TMJ alignment normal. Orbits: Bony orbits intact.  No intraorbital gas or fluid Sinuses: Paranasal sinuses, visualized mastoid air cells, and middle ear cavities clear bilaterally Soft tissues: Soft tissue gas LEFT periorbital extending into pre maxillary region consistent with large soft tissue laceration. Associated soft tissue swelling and small LEFT frontal scalp hematoma. Remaining soft tissues unremarkable. CT CERVICAL SPINE FINDINGS Alignment: Normal Skull base and vertebrae: Osseous mineralization low normal. Skull base intact. Vertebral body heights maintained. Disc space narrowing C5-C6 with small endplate spurs. No fracture, subluxation or bone destruction. Scattered multilevel facet degenerative changes bilaterally. Soft tissues and spinal canal: Prevertebral soft tissues normal thickness. Atherosclerotic  calcification of vertebral arteries at skull base, carotid bifurcations, and proximal RIGHT subclavian artery. Disc levels:  No specific abnormalities Upper chest: Lung apices clear Other: N/A IMPRESSION: No acute intracranial abnormalities. LEFT facial laceration with soft tissue gas LEFT periorbital extending into pre maxillary region. Thin calcific density anterior to the LEFT inferior orbital rim, question radiopaque foreign body versus tiny fracture fragment from LEFT inferior orbital rim of; no definite donor site seen. Scattered multilevel degenerative facet degenerative changes of the cervical spine. Degenerative disc disease changes C5-C6. No acute cervical spine abnormalities. Electronically Signed   By: Lavonia Dana M.D.   On: 02/17/2022 09:33   CT CERVICAL SPINE WO CONTRAST  Result Date: 02/17/2022 CLINICAL DATA:  [place] this morning striking LEFT side of face/head on dresser, LEFT facial pain, neck pain, blunt facial trauma, denies loss of consciousness. History of cardiomyopathy, hypertension, type II diabetes mellitus EXAM: CT HEAD WITHOUT CONTRAST CT MAXILLOFACIAL WITHOUT CONTRAST CT CERVICAL SPINE WITHOUT CONTRAST TECHNIQUE: Multidetector CT imaging of the head, cervical spine, and maxillofacial structures were performed using the standard protocol without intravenous contrast. Multiplanar CT image reconstructions of the cervical spine and maxillofacial structures were also generated. RADIATION DOSE REDUCTION: This exam was performed according to the departmental dose-optimization program which includes automated exposure control, adjustment of the mA and/or kV according to patient size and/or use of iterative reconstruction technique. COMPARISON:  CT head 03/11/2020 FINDINGS: CT HEAD FINDINGS Brain: Normal ventricular morphology. No midline shift or mass effect. Normal appearance of brain parenchyma. No intracranial hemorrhage, mass lesion, evidence of acute infarction, or extra-axial fluid  collection. Vascular: No hyperdense vessels. Atherosclerotic calcifications identified at internal carotid and vertebral arteries at skull base. Skull: Calvaria intact Other: N/A CT MAXILLOFACIAL FINDINGS Osseous: Osseous mineralization normal. Thin calcific density identified anterior to the LEFT inferior orbital rim, question radiopaque foreign body versus  tiny fracture fragment; no definite donor site seen. Remaining facial bones intact. TMJ alignment normal. Orbits: Bony orbits intact.  No intraorbital gas or fluid Sinuses: Paranasal sinuses, visualized mastoid air cells, and middle ear cavities clear bilaterally Soft tissues: Soft tissue gas LEFT periorbital extending into pre maxillary region consistent with large soft tissue laceration. Associated soft tissue swelling and small LEFT frontal scalp hematoma. Remaining soft tissues unremarkable. CT CERVICAL SPINE FINDINGS Alignment: Normal Skull base and vertebrae: Osseous mineralization low normal. Skull base intact. Vertebral body heights maintained. Disc space narrowing C5-C6 with small endplate spurs. No fracture, subluxation or bone destruction. Scattered multilevel facet degenerative changes bilaterally. Soft tissues and spinal canal: Prevertebral soft tissues normal thickness. Atherosclerotic calcification of vertebral arteries at skull base, carotid bifurcations, and proximal RIGHT subclavian artery. Disc levels:  No specific abnormalities Upper chest: Lung apices clear Other: N/A IMPRESSION: No acute intracranial abnormalities. LEFT facial laceration with soft tissue gas LEFT periorbital extending into pre maxillary region. Thin calcific density anterior to the LEFT inferior orbital rim, question radiopaque foreign body versus tiny fracture fragment from LEFT inferior orbital rim of; no definite donor site seen. Scattered multilevel degenerative facet degenerative changes of the cervical spine. Degenerative disc disease changes C5-C6. No acute cervical  spine abnormalities. Electronically Signed   By: Lavonia Dana M.D.   On: 02/17/2022 09:33   CT MAXILLOFACIAL WO CONTRAST  Result Date: 02/17/2022 CLINICAL DATA:  [place] this morning striking LEFT side of face/head on dresser, LEFT facial pain, neck pain, blunt facial trauma, denies loss of consciousness. History of cardiomyopathy, hypertension, type II diabetes mellitus EXAM: CT HEAD WITHOUT CONTRAST CT MAXILLOFACIAL WITHOUT CONTRAST CT CERVICAL SPINE WITHOUT CONTRAST TECHNIQUE: Multidetector CT imaging of the head, cervical spine, and maxillofacial structures were performed using the standard protocol without intravenous contrast. Multiplanar CT image reconstructions of the cervical spine and maxillofacial structures were also generated. RADIATION DOSE REDUCTION: This exam was performed according to the departmental dose-optimization program which includes automated exposure control, adjustment of the mA and/or kV according to patient size and/or use of iterative reconstruction technique. COMPARISON:  CT head 03/11/2020 FINDINGS: CT HEAD FINDINGS Brain: Normal ventricular morphology. No midline shift or mass effect. Normal appearance of brain parenchyma. No intracranial hemorrhage, mass lesion, evidence of acute infarction, or extra-axial fluid collection. Vascular: No hyperdense vessels. Atherosclerotic calcifications identified at internal carotid and vertebral arteries at skull base. Skull: Calvaria intact Other: N/A CT MAXILLOFACIAL FINDINGS Osseous: Osseous mineralization normal. Thin calcific density identified anterior to the LEFT inferior orbital rim, question radiopaque foreign body versus tiny fracture fragment; no definite donor site seen. Remaining facial bones intact. TMJ alignment normal. Orbits: Bony orbits intact.  No intraorbital gas or fluid Sinuses: Paranasal sinuses, visualized mastoid air cells, and middle ear cavities clear bilaterally Soft tissues: Soft tissue gas LEFT periorbital extending  into pre maxillary region consistent with large soft tissue laceration. Associated soft tissue swelling and small LEFT frontal scalp hematoma. Remaining soft tissues unremarkable. CT CERVICAL SPINE FINDINGS Alignment: Normal Skull base and vertebrae: Osseous mineralization low normal. Skull base intact. Vertebral body heights maintained. Disc space narrowing C5-C6 with small endplate spurs. No fracture, subluxation or bone destruction. Scattered multilevel facet degenerative changes bilaterally. Soft tissues and spinal canal: Prevertebral soft tissues normal thickness. Atherosclerotic calcification of vertebral arteries at skull base, carotid bifurcations, and proximal RIGHT subclavian artery. Disc levels:  No specific abnormalities Upper chest: Lung apices clear Other: N/A IMPRESSION: No acute intracranial abnormalities. LEFT facial laceration with soft tissue  gas LEFT periorbital extending into pre maxillary region. Thin calcific density anterior to the LEFT inferior orbital rim, question radiopaque foreign body versus tiny fracture fragment from LEFT inferior orbital rim of; no definite donor site seen. Scattered multilevel degenerative facet degenerative changes of the cervical spine. Degenerative disc disease changes C5-C6. No acute cervical spine abnormalities. Electronically Signed   By: Lavonia Dana M.D.   On: 02/17/2022 09:33    Radiological Exams on Admission: CT HEAD WO CONTRAST  Result Date: 02/17/2022 CLINICAL DATA:  [place] this morning striking LEFT side of face/head on dresser, LEFT facial pain, neck pain, blunt facial trauma, denies loss of consciousness. History of cardiomyopathy, hypertension, type II diabetes mellitus EXAM: CT HEAD WITHOUT CONTRAST CT MAXILLOFACIAL WITHOUT CONTRAST CT CERVICAL SPINE WITHOUT CONTRAST TECHNIQUE: Multidetector CT imaging of the head, cervical spine, and maxillofacial structures were performed using the standard protocol without intravenous contrast. Multiplanar CT  image reconstructions of the cervical spine and maxillofacial structures were also generated. RADIATION DOSE REDUCTION: This exam was performed according to the departmental dose-optimization program which includes automated exposure control, adjustment of the mA and/or kV according to patient size and/or use of iterative reconstruction technique. COMPARISON:  CT head 03/11/2020 FINDINGS: CT HEAD FINDINGS Brain: Normal ventricular morphology. No midline shift or mass effect. Normal appearance of brain parenchyma. No intracranial hemorrhage, mass lesion, evidence of acute infarction, or extra-axial fluid collection. Vascular: No hyperdense vessels. Atherosclerotic calcifications identified at internal carotid and vertebral arteries at skull base. Skull: Calvaria intact Other: N/A CT MAXILLOFACIAL FINDINGS Osseous: Osseous mineralization normal. Thin calcific density identified anterior to the LEFT inferior orbital rim, question radiopaque foreign body versus tiny fracture fragment; no definite donor site seen. Remaining facial bones intact. TMJ alignment normal. Orbits: Bony orbits intact.  No intraorbital gas or fluid Sinuses: Paranasal sinuses, visualized mastoid air cells, and middle ear cavities clear bilaterally Soft tissues: Soft tissue gas LEFT periorbital extending into pre maxillary region consistent with large soft tissue laceration. Associated soft tissue swelling and small LEFT frontal scalp hematoma. Remaining soft tissues unremarkable. CT CERVICAL SPINE FINDINGS Alignment: Normal Skull base and vertebrae: Osseous mineralization low normal. Skull base intact. Vertebral body heights maintained. Disc space narrowing C5-C6 with small endplate spurs. No fracture, subluxation or bone destruction. Scattered multilevel facet degenerative changes bilaterally. Soft tissues and spinal canal: Prevertebral soft tissues normal thickness. Atherosclerotic calcification of vertebral arteries at skull base, carotid  bifurcations, and proximal RIGHT subclavian artery. Disc levels:  No specific abnormalities Upper chest: Lung apices clear Other: N/A IMPRESSION: No acute intracranial abnormalities. LEFT facial laceration with soft tissue gas LEFT periorbital extending into pre maxillary region. Thin calcific density anterior to the LEFT inferior orbital rim, question radiopaque foreign body versus tiny fracture fragment from LEFT inferior orbital rim of; no definite donor site seen. Scattered multilevel degenerative facet degenerative changes of the cervical spine. Degenerative disc disease changes C5-C6. No acute cervical spine abnormalities. Electronically Signed   By: Lavonia Dana M.D.   On: 02/17/2022 09:33   CT CERVICAL SPINE WO CONTRAST  Result Date: 02/17/2022 CLINICAL DATA:  [place] this morning striking LEFT side of face/head on dresser, LEFT facial pain, neck pain, blunt facial trauma, denies loss of consciousness. History of cardiomyopathy, hypertension, type II diabetes mellitus EXAM: CT HEAD WITHOUT CONTRAST CT MAXILLOFACIAL WITHOUT CONTRAST CT CERVICAL SPINE WITHOUT CONTRAST TECHNIQUE: Multidetector CT imaging of the head, cervical spine, and maxillofacial structures were performed using the standard protocol without intravenous contrast. Multiplanar CT image reconstructions of  the cervical spine and maxillofacial structures were also generated. RADIATION DOSE REDUCTION: This exam was performed according to the departmental dose-optimization program which includes automated exposure control, adjustment of the mA and/or kV according to patient size and/or use of iterative reconstruction technique. COMPARISON:  CT head 03/11/2020 FINDINGS: CT HEAD FINDINGS Brain: Normal ventricular morphology. No midline shift or mass effect. Normal appearance of brain parenchyma. No intracranial hemorrhage, mass lesion, evidence of acute infarction, or extra-axial fluid collection. Vascular: No hyperdense vessels. Atherosclerotic  calcifications identified at internal carotid and vertebral arteries at skull base. Skull: Calvaria intact Other: N/A CT MAXILLOFACIAL FINDINGS Osseous: Osseous mineralization normal. Thin calcific density identified anterior to the LEFT inferior orbital rim, question radiopaque foreign body versus tiny fracture fragment; no definite donor site seen. Remaining facial bones intact. TMJ alignment normal. Orbits: Bony orbits intact.  No intraorbital gas or fluid Sinuses: Paranasal sinuses, visualized mastoid air cells, and middle ear cavities clear bilaterally Soft tissues: Soft tissue gas LEFT periorbital extending into pre maxillary region consistent with large soft tissue laceration. Associated soft tissue swelling and small LEFT frontal scalp hematoma. Remaining soft tissues unremarkable. CT CERVICAL SPINE FINDINGS Alignment: Normal Skull base and vertebrae: Osseous mineralization low normal. Skull base intact. Vertebral body heights maintained. Disc space narrowing C5-C6 with small endplate spurs. No fracture, subluxation or bone destruction. Scattered multilevel facet degenerative changes bilaterally. Soft tissues and spinal canal: Prevertebral soft tissues normal thickness. Atherosclerotic calcification of vertebral arteries at skull base, carotid bifurcations, and proximal RIGHT subclavian artery. Disc levels:  No specific abnormalities Upper chest: Lung apices clear Other: N/A IMPRESSION: No acute intracranial abnormalities. LEFT facial laceration with soft tissue gas LEFT periorbital extending into pre maxillary region. Thin calcific density anterior to the LEFT inferior orbital rim, question radiopaque foreign body versus tiny fracture fragment from LEFT inferior orbital rim of; no definite donor site seen. Scattered multilevel degenerative facet degenerative changes of the cervical spine. Degenerative disc disease changes C5-C6. No acute cervical spine abnormalities. Electronically Signed   By: Lavonia Dana  M.D.   On: 02/17/2022 09:33   CT MAXILLOFACIAL WO CONTRAST  Result Date: 02/17/2022 CLINICAL DATA:  [place] this morning striking LEFT side of face/head on dresser, LEFT facial pain, neck pain, blunt facial trauma, denies loss of consciousness. History of cardiomyopathy, hypertension, type II diabetes mellitus EXAM: CT HEAD WITHOUT CONTRAST CT MAXILLOFACIAL WITHOUT CONTRAST CT CERVICAL SPINE WITHOUT CONTRAST TECHNIQUE: Multidetector CT imaging of the head, cervical spine, and maxillofacial structures were performed using the standard protocol without intravenous contrast. Multiplanar CT image reconstructions of the cervical spine and maxillofacial structures were also generated. RADIATION DOSE REDUCTION: This exam was performed according to the departmental dose-optimization program which includes automated exposure control, adjustment of the mA and/or kV according to patient size and/or use of iterative reconstruction technique. COMPARISON:  CT head 03/11/2020 FINDINGS: CT HEAD FINDINGS Brain: Normal ventricular morphology. No midline shift or mass effect. Normal appearance of brain parenchyma. No intracranial hemorrhage, mass lesion, evidence of acute infarction, or extra-axial fluid collection. Vascular: No hyperdense vessels. Atherosclerotic calcifications identified at internal carotid and vertebral arteries at skull base. Skull: Calvaria intact Other: N/A CT MAXILLOFACIAL FINDINGS Osseous: Osseous mineralization normal. Thin calcific density identified anterior to the LEFT inferior orbital rim, question radiopaque foreign body versus tiny fracture fragment; no definite donor site seen. Remaining facial bones intact. TMJ alignment normal. Orbits: Bony orbits intact.  No intraorbital gas or fluid Sinuses: Paranasal sinuses, visualized mastoid air cells, and middle ear cavities  clear bilaterally Soft tissues: Soft tissue gas LEFT periorbital extending into pre maxillary region consistent with large soft tissue  laceration. Associated soft tissue swelling and small LEFT frontal scalp hematoma. Remaining soft tissues unremarkable. CT CERVICAL SPINE FINDINGS Alignment: Normal Skull base and vertebrae: Osseous mineralization low normal. Skull base intact. Vertebral body heights maintained. Disc space narrowing C5-C6 with small endplate spurs. No fracture, subluxation or bone destruction. Scattered multilevel facet degenerative changes bilaterally. Soft tissues and spinal canal: Prevertebral soft tissues normal thickness. Atherosclerotic calcification of vertebral arteries at skull base, carotid bifurcations, and proximal RIGHT subclavian artery. Disc levels:  No specific abnormalities Upper chest: Lung apices clear Other: N/A IMPRESSION: No acute intracranial abnormalities. LEFT facial laceration with soft tissue gas LEFT periorbital extending into pre maxillary region. Thin calcific density anterior to the LEFT inferior orbital rim, question radiopaque foreign body versus tiny fracture fragment from LEFT inferior orbital rim of; no definite donor site seen. Scattered multilevel degenerative facet degenerative changes of the cervical spine. Degenerative disc disease changes C5-C6. No acute cervical spine abnormalities. Electronically Signed   By: Lavonia Dana M.D.   On: 02/17/2022 09:33    DVT Prophylaxis -SCD /heparin AM Labs Ordered, also please review Full Orders  Family Communication: Admission, patients condition and plan of care including tests being ordered have been discussed with the patient and female friend at bedside who indicate understanding and agree with the plan   Condition   -fair/guarded  Roxan Hockey M.D on 02/17/2022 at 1:07 PM Go to [email or web address] -  for contact info  [facility] - Office  [phone]

## 2022-02-17 NOTE — H&P (Addendum)
[name] is an 71 y.o. female.   Chief Complaint: Complex laceration to left cheek HPI: The patient is a 71 yrs old female here for treatment of a left cheek complex laceration.  She was at home several hours ago and states that she fell and hit her face on a piece of furniture.  It appears she has missing skin and soft tissue of the left cheek. She has a history of heart failure with a pacemaker and N/D/V over the past few days.  She has HTN, hypercholesterolemia, depression and DM.   Past Medical History:  Diagnosis Date   Anxiety    Cardiomyopathy    Cervical high risk HPV (human papillomavirus) test positive 01/30/2015   Chronic constipation    Chronic lower back pain    Constipation 01/30/2015   Depression    GERD (gastroesophageal reflux disease)    Heart failure    Chronic combined   HTN (hypertension)    Hypercholesteremia    Neurogenic bladder    2008 nerve damage s/p back ssurgery   Type II diabetes mellitus [facility])     Past Surgical History:  Procedure Laterality Date   ACHILLES TENDON SURGERY Right 06/2012   BACK SURGERY  2005   BI-VENTRICULAR IMPLANTABLE CARDIOVERTER DEFIBRILLATOR  (CRT-D)  12-18-2013   upgrade of previously implanted dual chamber pacemaker to MDT CRTD with RV lead revision by Dr Placido Sou PACEMAKER UPGRADE  12/18/2013   w/defibrillator   BIV PACEMAKER GENERATOR CHANGE OUT N/A 12/18/2013   Procedure: BIV PACEMAKER GENERATOR CHANGE OUT;  Surgeon: Evans Lance, MD;  Location: [facility] CATH LAB;  Service: Cardiovascular;  Laterality: N/A;   BIV PACEMAKER INSERTION CRT-P N/A 02/22/2020   Procedure: DOWNGRADE BIV PACEMAKER INSERTION CRT-P;  Surgeon: Evans Lance, MD;  Location: [facility] CV LAB;  Service: Cardiovascular;  Laterality: N/A;   BREAST BIOPSY Left 1973   benign   BREAST LUMPECTOMY Left [place]   CHOLECYSTECTOMY     GASTROCNEMIUS RECESSION  06/24/2012   Procedure: GASTROCNEMIUS SLIDE;  Surgeon: Colin Rhein, MD;  Location: [facility] ORS;  Service: Orthopedics;  Laterality: Right;   HERNIA REPAIR     LEAD REVISION N/A 12/18/2013   Procedure: LEAD REVISION;  Surgeon: Evans Lance, MD;  Location: [facility] CATH LAB;  Service: Cardiovascular;  Laterality: N/A;   PACEMAKER INSERTION  2006   Dual chamber MDT pacemaker implanted - subsequent upgrade to CRTD 12-2013   POSTERIOR LUMBAR FUSION  2006; 2008   SIGMOIDOSCOPY  MAY 2011 w/ PROP   iTCS-->FSIG-poor bowel prep   TUBAL LIGATION  ~ 1984   UPPER GASTROINTESTINAL ENDOSCOPY  MAY 2011 w/ PROP   MILD GASTRITIS 2o ETOH    Family History  Problem Relation Age of Onset   Alzheimer's disease Mother    Diabetes Mother    Cancer Father    Cancer Sister        breast   Diabetes Sister    Diabetes Brother    Brain cancer Brother    Diabetes Brother    Colon cancer Neg Hx    Colon polyps Neg Hx    Social History:  reports that she has never smoked. She has never used smokeless tobacco. She reports current alcohol use. She reports that she does not use drugs.  Allergies: No Known Allergies  Medications Prior to Admission  Medication Sig Dispense Refill   Artificial Tear Solution (SYSTANE CONTACTS OP) Place 1 drop  into both eyes 2 (two) times daily.     aspirin EC 81 MG tablet Take 81 mg by mouth daily.     buPROPion (WELLBUTRIN XL) 300 MG 24 hr tablet Take 300 mg by mouth daily.     BYDUREON BCISE 2 MG/0.85ML AUIJ Inject 2 mg into the skin once a week.     carvedilol (COREG) 12.5 MG tablet Take 12.5 mg by mouth 2 (two) times daily.     clonazePAM (KLONOPIN) 1 MG tablet Take 1 mg by mouth 2 (two) times daily as needed for anxiety.     fluticasone (FLONASE) 50 MCG/ACT nasal spray Place 2 sprays into both nostrils daily. 16 g 0   furosemide (LASIX) 40 MG tablet Take 1 tablet (40 mg total) by mouth daily. 90 tablet 3   gabapentin (NEURONTIN) 600 MG tablet Take 600 mg by mouth at bedtime.      HYDROcodone-acetaminophen (NORCO) 10-325 MG tablet Take 1-2  tablets by mouth every 6 (six) hours as needed for moderate pain.     insulin lispro (HUMALOG KWIKPEN) 100 UNIT/ML KwikPen INJECT UP TO 50 UNITS DAILY (Patient taking differently: 5 Units. INJECT UP TO 50 UNITS DAILY - sliding scale) 15 mL 2   losartan (COZAAR) 25 MG tablet Take 12.5 mg by mouth daily.     omeprazole (PRILOSEC) 20 MG capsule Take 20 mg by mouth daily.     potassium chloride SA (K-DUR,KLOR-CON) 20 MEQ tablet Take 20 mEq by mouth 2 (two) times daily.     rosuvastatin (CRESTOR) 40 MG tablet TAKE ONE (1) TABLET BY MOUTH EVERY DAY (STOP ATORVASTATIN) 90 tablet 0   sertraline (ZOLOFT) 100 MG tablet Take 200 mg by mouth daily.     zolpidem (AMBIEN) 10 MG tablet Take 10 mg by mouth at bedtime as needed for sleep.     benzonatate (TESSALON) 200 MG capsule Take 1 capsule (200 mg total) by mouth 3 (three) times daily as needed for cough. (Patient not taking: Reported on 02/17/2022) 30 capsule 0   LANTUS SOLOSTAR 100 UNIT/ML Solostar Pen INJECT 50 UNITS INTO THE SKIN DAILY AT 10:00PM (Patient not taking: Reported on 02/17/2022) 15 mL 2   ondansetron (ZOFRAN ODT) 4 MG disintegrating tablet Take 1 tablet (4 mg total) by mouth every 8 (eight) hours as needed for nausea or vomiting. (Patient not taking: Reported on 02/17/2022) 20 tablet 0    Results for orders placed or performed during the hospital encounter of 02/17/22 (from the past 48 hour(s))  Resp Panel by RT-PCR (Flu A&B, Covid) Anterior Nasal Swab     Status: None   Collection Time: 02/17/22  7:56 AM   Specimen: Anterior Nasal Swab  Result Value Ref Range   SARS Coronavirus 2 by RT PCR NEGATIVE NEGATIVE    Comment: (NOTE) SARS-CoV-2 target nucleic acids are NOT DETECTED.  The SARS-CoV-2 RNA is generally detectable in upper respiratory specimens during the acute phase of infection. The lowest concentration of SARS-CoV-2 viral copies this assay can detect is 138 copies/mL. A negative result does not preclude SARS-Cov-2 infection and should  not be used as the sole basis for treatment or other patient management decisions. A negative result may occur with  improper specimen collection/handling, submission of specimen other than nasopharyngeal swab, presence of viral mutation(s) within the areas targeted by this assay, and inadequate number of viral copies(<138 copies/mL). A negative result must be combined with clinical observations, patient history, and epidemiological information. The expected result is Negative.  Fact Sheet  for Patients:  EntrepreneurPulse.com.au  Fact Sheet for Healthcare Providers:  IncredibleEmployment.be  This test is no t yet approved or cleared by the [place] FDA and  has been authorized for detection and/or diagnosis of SARS-CoV-2 by FDA under an Emergency Use Authorization (EUA). This EUA will remain  in effect (meaning this test can be used) for the duration of the COVID-19 declaration under Section 564(b)(1) of the Act, 21 U.S.C.section 360bbb-3(b)(1), unless the authorization is terminated  or revoked sooner.       Influenza A by PCR NEGATIVE NEGATIVE   Influenza B by PCR NEGATIVE NEGATIVE    Comment: (NOTE) The Xpert Xpress SARS-CoV-2/FLU/RSV plus assay is intended as an aid in the diagnosis of influenza from Nasopharyngeal swab specimens and should not be used as a sole basis for treatment. Nasal washings and aspirates are unacceptable for Xpert Xpress SARS-CoV-2/FLU/RSV testing.  Fact Sheet for Patients: EntrepreneurPulse.com.au  Fact Sheet for Healthcare Providers: IncredibleEmployment.be  This test is not yet approved or cleared by the [place] FDA and has been authorized for detection and/or diagnosis of SARS-CoV-2 by FDA under an Emergency Use Authorization (EUA). This EUA will remain in effect (meaning this test can be used) for the duration of the COVID-19 declaration under Section 564(b)(1)  of the Act, 21 U.S.C. section 360bbb-3(b)(1), unless the authorization is terminated or revoked.  Performed at [facility], [address] [place], [place] [address]   Comprehensive metabolic panel     Status: Abnormal   Collection Time: 02/17/22  7:56 AM  Result Value Ref Range   Sodium 132 (L) 135 - 145 mmol/L   Potassium 4.3 3.5 - 5.1 mmol/L   Chloride 99 98 - 111 mmol/L   CO2 18 (L) 22 - 32 mmol/L   Glucose, Bld 256 (H) 70 - 99 mg/dL    Comment: Glucose reference range applies only to samples taken after fasting for at least 8 hours.   BUN 67 (H) 8 - 23 mg/dL   Creatinine, Ser 5.58 (H) 0.44 - 1.00 mg/dL   Calcium 8.8 (L) 8.9 - 10.3 mg/dL   Total Protein 7.3 6.5 - 8.1 g/dL   Albumin 3.7 3.5 - 5.0 g/dL   AST 162 (H) 15 - 41 U/L   ALT 68 (H) 0 - 44 U/L   Alkaline Phosphatase 111 38 - 126 U/L   Total Bilirubin 1.1 0.3 - 1.2 mg/dL   GFR, Estimated 8 (L) >60 mL/min    Comment: (NOTE) Calculated using the CKD-EPI Creatinine Equation (2021)    Anion gap 15 5 - 15    Comment: Performed at [facility]: [facility], [address], [place], [place] [address]  CBC     Status: Abnormal   Collection Time: 02/17/22  7:56 AM  Result Value Ref Range   WBC 13.3 (H) 4.0 - 10.5 K/uL   RBC 3.44 (L) 3.87 - 5.11 MIL/uL   Hemoglobin 10.8 (L) 12.0 - 15.0 g/dL   HCT 32.8 (L) 36.0 - 46.0 %   MCV 95.3 80.0 - 100.0 fL   MCH 31.4 26.0 - 34.0 pg   MCHC 32.9 30.0 - 36.0 g/dL   RDW 14.0 11.5 - 15.5 %   Platelets 172 150 - 400 K/uL   nRBC 0.0 0.0 - 0.2 %    Comment: Performed at [facility], [address], [place], [place] [address]  Ethanol     Status: None   Collection Time: 02/17/22  7:56 AM  Result Value Ref Range  Alcohol, Ethyl (B) <10 <10 mg/dL    Comment: (NOTE) Lowest detectable limit for serum alcohol is 10 mg/dL.  For medical purposes only. Performed at [facility], [address] [place], [place] [address]   Urinalysis, Routine w reflex microscopic Urine, Catheterized     Status: Abnormal    Collection Time: 02/17/22  7:56 AM  Result Value Ref Range   Color, Urine YELLOW YELLOW   APPearance HAZY (A) CLEAR   Specific Gravity, Urine 1.012 1.005 - 1.030   pH 5.0 5.0 - 8.0   Glucose, UA NEGATIVE NEGATIVE mg/dL   Hgb urine dipstick MODERATE (A) NEGATIVE   Bilirubin Urine NEGATIVE NEGATIVE   Ketones, ur NEGATIVE NEGATIVE mg/dL   Protein, ur 100 (A) NEGATIVE mg/dL   Nitrite NEGATIVE NEGATIVE   Leukocytes,Ua LARGE (A) NEGATIVE   RBC / HPF 6-10 0 - 5 RBC/hpf   WBC, UA >50 (H) 0 - 5 WBC/hpf   Bacteria, UA MANY (A) NONE SEEN   Squamous Epithelial / LPF 0-5 0 - 5   WBC Clumps PRESENT    Mucus PRESENT    Granular Casts, UA PRESENT    Amorphous Crystal PRESENT     Comment: Performed at [facility], [address] [place], [place] [address]  Protime-INR     Status: None   Collection Time: 02/17/22  7:56 AM  Result Value Ref Range   Prothrombin Time 14.2 11.4 - 15.2 seconds   INR 1.1 0.8 - 1.2    Comment: (NOTE) INR goal varies based on device and disease states. Performed at [facility], [address] [place], [place] [address]   Sample to Blood Bank     Status: None   Collection Time: 02/17/22  7:56 AM  Result Value Ref Range   Blood Bank Specimen SAMPLE AVAILABLE FOR TESTING    Sample Expiration      02/18/2022,2359 Performed at [facility], [address], [place],  [address]   I-Stat Chem 8, ED     Status: Abnormal   Collection Time: 02/17/22  8:08 AM  Result Value Ref Range   Sodium 132 (L) 135 - 145 mmol/L   Potassium 4.3 3.5 - 5.1 mmol/L   Chloride 99 98 - 111 mmol/L   BUN 66 (H) 8 - 23 mg/dL   Creatinine, Ser 6.40 (H) 0.44 - 1.00 mg/dL   Glucose, Bld 245 (H) 70 - 99 mg/dL    Comment: Glucose reference range applies only to samples taken after fasting for at least 8 hours.   Calcium, Ion 1.08 (L) 1.15 - 1.40 mmol/L   TCO2 17 (L) 22 - 32 mmol/L   Hemoglobin 11.2 (L) 12.0 - 15.0 g/dL   HCT 33.0 (L) 36.0 - 46.0 %  Lactic acid, plasma     Status:  Abnormal   Collection Time: 02/17/22  8:33 AM  Result Value Ref Range   Lactic Acid, Venous 2.1 (HH) 0.5 - 1.9 mmol/L    Comment: CRITICAL RESULT CALLED TO, READ BACK BY AND VERIFIED WITH: MICHAEL DOSS @ [phone] ON 02/17/22 Sandy Salaam Performed at [facility], [address], [place],  [address]   Rapid urine drug screen (hospital performed)     Status: Abnormal   Collection Time: 02/17/22 11:10 AM  Result Value Ref Range   Opiates POSITIVE (A) NONE DETECTED   Cocaine NONE DETECTED NONE DETECTED   Benzodiazepines NONE DETECTED NONE DETECTED   Amphetamines NONE DETECTED NONE DETECTED   Tetrahydrocannabinol NONE DETECTED NONE DETECTED  Barbiturates NONE DETECTED NONE DETECTED    Comment: (NOTE) DRUG SCREEN FOR MEDICAL PURPOSES ONLY.  IF CONFIRMATION IS NEEDED FOR ANY PURPOSE, NOTIFY LAB WITHIN 5 DAYS.  LOWEST DETECTABLE LIMITS FOR URINE DRUG SCREEN Drug Class                     Cutoff (ng/mL) Amphetamine and metabolites    1000 Barbiturate and metabolites    200 Benzodiazepine                 622 Tricyclics and metabolites     300 Opiates and metabolites        300 Cocaine and metabolites        300 THC                            50 Performed at [facility], [address] [place], [place] [address]   Lactic acid, plasma     Status: None   Collection Time: 02/17/22 12:57 PM  Result Value Ref Range   Lactic Acid, Venous 1.3 0.5 - 1.9 mmol/L    Comment: Performed at [facility], [address], [place], [place] [address]  Type and screen [place]     Status: None (Preliminary result)   Collection Time: 02/17/22  3:24 PM  Result Value Ref Range   ABO/RH(D) PENDING    Antibody Screen PENDING    Sample Expiration      02/20/2022,2359 Performed at [facility], [place] [address] [place], [place] [address]    CT HEAD WO CONTRAST  Result Date: 02/17/2022 CLINICAL DATA:  [place] this morning striking LEFT side of face/head on dresser, LEFT facial pain,  neck pain, blunt facial trauma, denies loss of consciousness. History of cardiomyopathy, hypertension, type II diabetes mellitus EXAM: CT HEAD WITHOUT CONTRAST CT MAXILLOFACIAL WITHOUT CONTRAST CT CERVICAL SPINE WITHOUT CONTRAST TECHNIQUE: Multidetector CT imaging of the head, cervical spine, and maxillofacial structures were performed using the standard protocol without intravenous contrast. Multiplanar CT image reconstructions of the cervical spine and maxillofacial structures were also generated. RADIATION DOSE REDUCTION: This exam was performed according to the departmental dose-optimization program which includes automated exposure control, adjustment of the mA and/or kV according to patient size and/or use of iterative reconstruction technique. COMPARISON:  CT head 03/11/2020 FINDINGS: CT HEAD FINDINGS Brain: Normal ventricular morphology. No midline shift or mass effect. Normal appearance of brain parenchyma. No intracranial hemorrhage, mass lesion, evidence of acute infarction, or extra-axial fluid collection. Vascular: No hyperdense vessels. Atherosclerotic calcifications identified at internal carotid and vertebral arteries at skull base. Skull: Calvaria intact Other: N/A CT MAXILLOFACIAL FINDINGS Osseous: Osseous mineralization normal. Thin calcific density identified anterior to the LEFT inferior orbital rim, question radiopaque foreign body versus tiny fracture fragment; no definite donor site seen. Remaining facial bones intact. TMJ alignment normal. Orbits: Bony orbits intact.  No intraorbital gas or fluid Sinuses: Paranasal sinuses, visualized mastoid air cells, and middle ear cavities clear bilaterally Soft tissues: Soft tissue gas LEFT periorbital extending into pre maxillary region consistent with large soft tissue laceration. Associated soft tissue swelling and small LEFT frontal scalp hematoma. Remaining soft tissues unremarkable. CT CERVICAL SPINE FINDINGS Alignment: Normal Skull base and  vertebrae: Osseous mineralization low normal. Skull base intact. Vertebral body heights maintained. Disc space narrowing C5-C6 with small endplate spurs. No fracture, subluxation or bone destruction. Scattered multilevel facet degenerative changes bilaterally. Soft tissues and spinal canal:  Prevertebral soft tissues normal thickness. Atherosclerotic calcification of vertebral arteries at skull base, carotid bifurcations, and proximal RIGHT subclavian artery. Disc levels:  No specific abnormalities Upper chest: Lung apices clear Other: N/A IMPRESSION: No acute intracranial abnormalities. LEFT facial laceration with soft tissue gas LEFT periorbital extending into pre maxillary region. Thin calcific density anterior to the LEFT inferior orbital rim, question radiopaque foreign body versus tiny fracture fragment from LEFT inferior orbital rim of; no definite donor site seen. Scattered multilevel degenerative facet degenerative changes of the cervical spine. Degenerative disc disease changes C5-C6. No acute cervical spine abnormalities. Electronically Signed   By: Lavonia Dana M.D.   On: 02/17/2022 09:33   CT CERVICAL SPINE WO CONTRAST  Result Date: 02/17/2022 CLINICAL DATA:  [place] this morning striking LEFT side of face/head on dresser, LEFT facial pain, neck pain, blunt facial trauma, denies loss of consciousness. History of cardiomyopathy, hypertension, type II diabetes mellitus EXAM: CT HEAD WITHOUT CONTRAST CT MAXILLOFACIAL WITHOUT CONTRAST CT CERVICAL SPINE WITHOUT CONTRAST TECHNIQUE: Multidetector CT imaging of the head, cervical spine, and maxillofacial structures were performed using the standard protocol without intravenous contrast. Multiplanar CT image reconstructions of the cervical spine and maxillofacial structures were also generated. RADIATION DOSE REDUCTION: This exam was performed according to the departmental dose-optimization program which includes automated exposure control, adjustment of the mA  and/or kV according to patient size and/or use of iterative reconstruction technique. COMPARISON:  CT head 03/11/2020 FINDINGS: CT HEAD FINDINGS Brain: Normal ventricular morphology. No midline shift or mass effect. Normal appearance of brain parenchyma. No intracranial hemorrhage, mass lesion, evidence of acute infarction, or extra-axial fluid collection. Vascular: No hyperdense vessels. Atherosclerotic calcifications identified at internal carotid and vertebral arteries at skull base. Skull: Calvaria intact Other: N/A CT MAXILLOFACIAL FINDINGS Osseous: Osseous mineralization normal. Thin calcific density identified anterior to the LEFT inferior orbital rim, question radiopaque foreign body versus tiny fracture fragment; no definite donor site seen. Remaining facial bones intact. TMJ alignment normal. Orbits: Bony orbits intact.  No intraorbital gas or fluid Sinuses: Paranasal sinuses, visualized mastoid air cells, and middle ear cavities clear bilaterally Soft tissues: Soft tissue gas LEFT periorbital extending into pre maxillary region consistent with large soft tissue laceration. Associated soft tissue swelling and small LEFT frontal scalp hematoma. Remaining soft tissues unremarkable. CT CERVICAL SPINE FINDINGS Alignment: Normal Skull base and vertebrae: Osseous mineralization low normal. Skull base intact. Vertebral body heights maintained. Disc space narrowing C5-C6 with small endplate spurs. No fracture, subluxation or bone destruction. Scattered multilevel facet degenerative changes bilaterally. Soft tissues and spinal canal: Prevertebral soft tissues normal thickness. Atherosclerotic calcification of vertebral arteries at skull base, carotid bifurcations, and proximal RIGHT subclavian artery. Disc levels:  No specific abnormalities Upper chest: Lung apices clear Other: N/A IMPRESSION: No acute intracranial abnormalities. LEFT facial laceration with soft tissue gas LEFT periorbital extending into pre  maxillary region. Thin calcific density anterior to the LEFT inferior orbital rim, question radiopaque foreign body versus tiny fracture fragment from LEFT inferior orbital rim of; no definite donor site seen. Scattered multilevel degenerative facet degenerative changes of the cervical spine. Degenerative disc disease changes C5-C6. No acute cervical spine abnormalities. Electronically Signed   By: Lavonia Dana M.D.   On: 02/17/2022 09:33   DG Pelvis Portable  Result Date: 02/17/2022 CLINICAL DATA:  Pt had a fall last night suffering a facial trauma - complaining of left hip pain after - no chest complaints (hx of diabetes, htn, DJSH)[id number] EXAM: PORTABLE PELVIS 1-2 VIEWS COMPARISON:  None Available. FINDINGS: Hips are located. Sclerosis of the LEFT hip joint. No femoral neck fracture. No sacral fracture or pelvic fracture identified. Posterior lumbar fusion. IMPRESSION: 1. No acute findings the pelvis. 2. Arthropathy hip joints, LEFT greater than RIGHT. Electronically Signed   By: Suzy Bouchard M.D.   On: 02/17/2022 15:49   DG CHEST PORT 1 VIEW  Result Date: 02/17/2022 CLINICAL DATA:  Fall EXAM: PORTABLE CHEST 1 VIEW COMPARISON:  11/11/2021 FINDINGS: Mild atelectasis at the left lung base. No pleural effusion. No pneumothorax. Cardiomediastinal contours are within normal limits for technique. Left chest wall ICD. IMPRESSION: Mild atelectasis at the left lung base. Electronically Signed   By: Macy Mis M.D.   On: 02/17/2022 16:00   CT MAXILLOFACIAL WO CONTRAST  Result Date: 02/17/2022 CLINICAL DATA:  [place] this morning striking LEFT side of face/head on dresser, LEFT facial pain, neck pain, blunt facial trauma, denies loss of consciousness. History of cardiomyopathy, hypertension, type II diabetes mellitus EXAM: CT HEAD WITHOUT CONTRAST CT MAXILLOFACIAL WITHOUT CONTRAST CT CERVICAL SPINE WITHOUT CONTRAST TECHNIQUE: Multidetector CT imaging of the head, cervical spine, and maxillofacial structures were  performed using the standard protocol without intravenous contrast. Multiplanar CT image reconstructions of the cervical spine and maxillofacial structures were also generated. RADIATION DOSE REDUCTION: This exam was performed according to the departmental dose-optimization program which includes automated exposure control, adjustment of the mA and/or kV according to patient size and/or use of iterative reconstruction technique. COMPARISON:  CT head 03/11/2020 FINDINGS: CT HEAD FINDINGS Brain: Normal ventricular morphology. No midline shift or mass effect. Normal appearance of brain parenchyma. No intracranial hemorrhage, mass lesion, evidence of acute infarction, or extra-axial fluid collection. Vascular: No hyperdense vessels. Atherosclerotic calcifications identified at internal carotid and vertebral arteries at skull base. Skull: Calvaria intact Other: N/A CT MAXILLOFACIAL FINDINGS Osseous: Osseous mineralization normal. Thin calcific density identified anterior to the LEFT inferior orbital rim, question radiopaque foreign body versus tiny fracture fragment; no definite donor site seen. Remaining facial bones intact. TMJ alignment normal. Orbits: Bony orbits intact.  No intraorbital gas or fluid Sinuses: Paranasal sinuses, visualized mastoid air cells, and middle ear cavities clear bilaterally Soft tissues: Soft tissue gas LEFT periorbital extending into pre maxillary region consistent with large soft tissue laceration. Associated soft tissue swelling and small LEFT frontal scalp hematoma. Remaining soft tissues unremarkable. CT CERVICAL SPINE FINDINGS Alignment: Normal Skull base and vertebrae: Osseous mineralization low normal. Skull base intact. Vertebral body heights maintained. Disc space narrowing C5-C6 with small endplate spurs. No fracture, subluxation or bone destruction. Scattered multilevel facet degenerative changes bilaterally. Soft tissues and spinal canal: Prevertebral soft tissues normal  thickness. Atherosclerotic calcification of vertebral arteries at skull base, carotid bifurcations, and proximal RIGHT subclavian artery. Disc levels:  No specific abnormalities Upper chest: Lung apices clear Other: N/A IMPRESSION: No acute intracranial abnormalities. LEFT facial laceration with soft tissue gas LEFT periorbital extending into pre maxillary region. Thin calcific density anterior to the LEFT inferior orbital rim, question radiopaque foreign body versus tiny fracture fragment from LEFT inferior orbital rim of; no definite donor site seen. Scattered multilevel degenerative facet degenerative changes of the cervical spine. Degenerative disc disease changes C5-C6. No acute cervical spine abnormalities. Electronically Signed   By: Lavonia Dana M.D.   On: 02/17/2022 09:33    Review of Systems  Constitutional:  Positive for activity change and appetite change.  Eyes: Negative.   Respiratory: Negative.  Negative for chest tightness and shortness of breath.   Cardiovascular:  Negative for leg swelling.  Gastrointestinal: Negative.   Endocrine: Negative.   Genitourinary: Negative.   Musculoskeletal: Negative.   Skin:  Positive for wound.   Blood pressure (!) 100/52, pulse (!) 107, temperature (!) 100.4 F (38 C), resp. rate (!) 23, height 5' 1" (1.549 m), weight 67.1 kg, SpO2 94 %. Physical Exam Vitals and nursing note reviewed.  Cardiovascular:     Rate and Rhythm: Normal rate.  Pulmonary:     Effort: Pulmonary effort is normal.  Abdominal:     Palpations: Abdomen is soft.  Musculoskeletal:        General: Swelling and deformity present.  Skin:    General: Skin is warm.     Capillary Refill: Capillary refill takes less than 2 seconds.     Coloration: Skin is not jaundiced.     Findings: Bruising, erythema and lesion present.  Neurological:     Mental Status: She is alert and oriented to person, place, and time.  Psychiatric:        Mood and Affect: Mood normal.        Behavior:  Behavior normal.        Thought Content: Thought content normal.        Judgment: Judgment normal.     Assessment/Plan Complex left cheek laceration Plan for repair in OR with placement of Myriad.  [place], DO 02/17/2022, 4:25 PM

## 2022-02-17 NOTE — Op Note (Signed)
DATE OF OPERATION: 02/17/2022  LOCATION: [facility] Room  PREOPERATIVE DIAGNOSIS: complex left cheek laceration from trauma  POSTOPERATIVE DIAGNOSIS: Same  PROCEDURE:  Complex repair of left cheek traumatic laceration 12 cm Cheek tissue advancement to left cheek 5 x 8 cm Lower lid laceration repair layered closure 4 cm Nose laceration repair layered closure 4 cm  SURGEON: Judson Tsan Sanger Ardyth Kelso, DO  EBL: 2 cc  CONDITION: Stable  COMPLICATIONS: None  INDICATION: The patient, [name], is a 71 y.o. female born on 10-10-1950, is here for treatment of a complex laceration that occurred early this morning. The patient stated she was at home and fell sticking her left cheek on furniture sustaining a complex laceration to the nose, cheek and lower eyelid.   PROCEDURE DETAILS:  The patient was seen prior to surgery and marked.  The IV antibiotics were given. The patient was taken to the operating room and given a general anesthetic. A standard time out was performed and all information was confirmed by those in the room. SCDs were placed.   The face was prepped and draped.  Local with epinephrine was injected into the cheek and nose.  The area was cleaned with the betadine and saline.    The cheek flap was raised for an area of 5 x 8 cm in the lateral direction in order to advance the flap medially.  I was able to suture the deep fat structures to the orbital rim that was exposed.  This was done to help prevent inferior pulling of the lower lid.  The 4-0 Vicryl was used.  This included 4 cm.  The soft tissue was then advanced superiorly and medially. This was sutured to the lateral nasal fold with the 4-0 Vicryl to help prevent tenting of the soft tissue.  The skin edges were then closed at the lower lid for 4 cm using the 6-0 Vicryl.  The nasolabial fold was closed with the 5-0 and 6-0 Vicryl. The lateral superior nose 4 cm laceration was closed with the 6-0 Monocryl.  There was a 2 x  2 cm area of lateral nose lower lid without closure.  All of the myriad powder and sheet was applied and secured in place with the 6-0 Vicryl. The sorbact was applied over it and secured with the 5-0 and 6-0 Vicryl.  KY was applied and bactroban to the cheek.  The patient was allowed to wake up and taken to recovery room in stable condition at the end of the case. The family was notified at the end of the case.

## 2022-02-17 NOTE — ED Notes (Signed)
[facility] to transport patient at this time.

## 2022-02-18 ENCOUNTER — Encounter (HOSPITAL_COMMUNITY): Payer: Self-pay | Admitting: Plastic Surgery

## 2022-02-18 ENCOUNTER — Inpatient Hospital Stay (HOSPITAL_COMMUNITY): Payer: Medicare Other

## 2022-02-18 DIAGNOSIS — S0993XA Unspecified injury of face, initial encounter: Secondary | ICD-10-CM | POA: Diagnosis not present

## 2022-02-18 DIAGNOSIS — R571 Hypovolemic shock: Secondary | ICD-10-CM

## 2022-02-18 DIAGNOSIS — Z221 Carrier of other intestinal infectious diseases: Secondary | ICD-10-CM

## 2022-02-18 DIAGNOSIS — A498 Other bacterial infections of unspecified site: Secondary | ICD-10-CM | POA: Insufficient documentation

## 2022-02-18 DIAGNOSIS — I5042 Chronic combined systolic (congestive) and diastolic (congestive) heart failure: Secondary | ICD-10-CM

## 2022-02-18 DIAGNOSIS — S0180XA Unspecified open wound of other part of head, initial encounter: Secondary | ICD-10-CM | POA: Diagnosis not present

## 2022-02-18 LAB — CBC
HCT: 28.5 % — ABNORMAL LOW (ref 36.0–46.0)
Hemoglobin: 8.9 g/dL — ABNORMAL LOW (ref 12.0–15.0)
MCH: 31 pg (ref 26.0–34.0)
MCHC: 31.2 g/dL (ref 30.0–36.0)
MCV: 99.3 fL (ref 80.0–100.0)
Platelets: 123 10*3/uL — ABNORMAL LOW (ref 150–400)
RBC: 2.87 MIL/uL — ABNORMAL LOW (ref 3.87–5.11)
RDW: 14.3 % (ref 11.5–15.5)
WBC: 9.3 10*3/uL (ref 4.0–10.5)
nRBC: 0 % (ref 0.0–0.2)

## 2022-02-18 LAB — COMPREHENSIVE METABOLIC PANEL
ALT: 81 U/L — ABNORMAL HIGH (ref 0–44)
AST: 291 U/L — ABNORMAL HIGH (ref 15–41)
Albumin: 3 g/dL — ABNORMAL LOW (ref 3.5–5.0)
Alkaline Phosphatase: 207 U/L — ABNORMAL HIGH (ref 38–126)
Anion gap: 13 (ref 5–15)
BUN: 58 mg/dL — ABNORMAL HIGH (ref 8–23)
CO2: 13 mmol/L — ABNORMAL LOW (ref 22–32)
Calcium: 7.7 mg/dL — ABNORMAL LOW (ref 8.9–10.3)
Chloride: 110 mmol/L (ref 98–111)
Creatinine, Ser: 4.88 mg/dL — ABNORMAL HIGH (ref 0.44–1.00)
GFR, Estimated: 9 mL/min — ABNORMAL LOW (ref >60)
Glucose, Bld: 239 mg/dL — ABNORMAL HIGH (ref 70–99)
Potassium: 4.3 mmol/L (ref 3.5–5.1)
Sodium: 136 mmol/L (ref 135–145)
Total Bilirubin: 1 mg/dL (ref 0.3–1.2)
Total Protein: 6 g/dL — ABNORMAL LOW (ref 6.5–8.1)

## 2022-02-18 LAB — CLOSTRIDIUM DIFFICILE BY PCR, REFLEXED: Toxigenic C. Difficile by PCR: POSITIVE — AB

## 2022-02-18 LAB — URINE CULTURE: Culture: NO GROWTH

## 2022-02-18 LAB — C DIFFICILE QUICK SCREEN W PCR REFLEX
C Diff antigen: POSITIVE — AB
C Diff toxin: NEGATIVE

## 2022-02-18 LAB — GLUCOSE, CAPILLARY
Glucose-Capillary: 188 mg/dL — ABNORMAL HIGH (ref 70–99)
Glucose-Capillary: 264 mg/dL — ABNORMAL HIGH (ref 70–99)
Glucose-Capillary: 248 mg/dL — ABNORMAL HIGH (ref 70–99)
Glucose-Capillary: 225 mg/dL — ABNORMAL HIGH (ref 70–99)
Glucose-Capillary: 213 mg/dL — ABNORMAL HIGH (ref 70–99)

## 2022-02-18 LAB — ECHOCARDIOGRAM COMPLETE
Area-P 1/2: 5.75 cm2
Calc EF: 63.7 %
Height: 61 in
MV VTI: 2.42 cm2
S' Lateral: 3.1 cm
Single Plane A2C EF: 68.2 %
Single Plane A4C EF: 58.3 %
Weight: 2366.86 oz

## 2022-02-18 LAB — CK: Total CK: 1523 U/L — ABNORMAL HIGH (ref 38–234)

## 2022-02-18 LAB — PROTIME-INR
INR: 1.2 (ref 0.8–1.2)
Prothrombin Time: 15.2 seconds (ref 11.4–15.2)

## 2022-02-18 MED ORDER — SODIUM CHLORIDE 0.9 % IV BOLUS
1000.0000 mL | Freq: Once | INTRAVENOUS | Status: AC
  Administered 2022-02-18: 1000 mL via INTRAVENOUS

## 2022-02-18 MED ORDER — SODIUM CHLORIDE 0.9 % IV SOLN: INTRAVENOUS | Status: DC

## 2022-02-18 MED ORDER — STERILE WATER FOR INJECTION IV SOLN
INTRAVENOUS | Status: AC
  Filled 2022-02-18: qty 1000

## 2022-02-18 NOTE — Evaluation (Addendum)
Physical Therapy Evaluation Patient Details Name: [name] MRN: [id number] DOB: May 18, 1951 Today's Date: 02/18/2022  History of Present Illness  71 y.o. female presents to [facility] hospital on 02/17/2022 s/p fall, striking head on dresser. Pt underwent laceration repair on 6/6. 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  Clinical Impression  Prior to admission, pt was independent with all ADLs/IADLs and was the primary caregiver for her disabled husband who requires assistance with all tasks. Pt currently requires supervision for transfers and ambulation with RW for safety due to decreased vision in L eye and mild balance deficits. Pts sisters will be staying with her upon discharge to provide any assistance required. Pt would benefit from acute PT to address balance and mobility deficits while inpatient. Expected pt will continue to improve and will not require PT services upon discharge.      Recommendations for follow up therapy are one component of a multi-disciplinary discharge planning process, led by the attending physician.  Recommendations may be updated based on patient status, additional functional criteria and insurance authorization.  Follow Up Recommendations No PT follow up    Assistance Recommended at Discharge PRN  Patient can return home with the following  Assistance with cooking/housework;Assist for transportation;Help with stairs or ramp for entrance    Equipment Recommendations Rolling walker (2 wheels)  Recommendations for Other Services       Functional Status Assessment Patient has had a recent decline in their functional status and demonstrates the ability to make significant improvements in function in a reasonable and predictable amount of time.     Precautions / Restrictions Precautions Precautions: Fall Restrictions Weight Bearing Restrictions: No      Mobility   Bed Mobility               General bed mobility comments: pt in recliner upon arrival    Transfers Overall transfer level: Needs assistance Equipment used: None Transfers: Sit to/from Stand Sit to Stand: Supervision           General transfer comment: supervision for safety due to L eye vision deficits and hx of falls    Ambulation/Gait Ambulation/Gait assistance: Min guard Gait Distance (Feet): 300 Feet Assistive device: None, Rolling walker (2 wheels) (began with no AD, transitioned to RW) Gait Pattern/deviations: Decreased stride length, Shuffle Gait velocity: decreased Gait velocity interpretation: 1.31 - 2.62 ft/sec, indicative of limited community ambulator   General Gait Details: Pt with slow shuffling gait, reports no feelings of steadiness. Therapist suggested use of RW for additional support due to decreased vision in L eye and hx of falls. Gait velocity and stride length increased with RW.  Stairs            Wheelchair Mobility    Modified Rankin (Stroke Patients Only)       Balance Overall balance assessment: History of Falls, Needs assistance Sitting-balance support: Feet supported Sitting balance-Leahy Scale: Good     Standing balance support: No upper extremity supported Standing balance-Leahy Scale: Fair Standing balance comment: no overt LOB noted but mild unsteadiness throughout                             Pertinent Vitals/Pain Pain Assessment Pain Assessment: Faces Faces Pain Scale: Hurts a little bit Pain Location: face Pain Descriptors / Indicators: Discomfort Pain Intervention(s): Monitored during session    Home Living Family/patient expects to be  discharged to:: Private residence Living Arrangements: Spouse/significant other Available Help at Discharge: Family;Friend(s);Available 24 hours/day Type of Home: House Home Access: Ramped entrance       Home Layout: One level Home Equipment: Furniture conservator/restorer (2 wheels) (spouse has wc, per family report RW is very wide) Additional Comments: Pt's siblings from out of state coming to assist, also has supportive friends. Pt's husband is dependent and unable to provide assist    Prior Function Prior Level of Function : Independent/Modified Independent             Mobility Comments: no AD ADLs Comments: primary caregiver for dependent husband     Hand Dominance   Dominant Hand: Right    Extremity/Trunk Assessment   Upper Extremity Assessment Upper Extremity Assessment: Overall WFL for tasks assessed    Lower Extremity Assessment Lower Extremity Assessment: Overall WFL for tasks assessed    Cervical / Trunk Assessment Cervical / Trunk Assessment: Normal  Communication   Communication: No difficulties  Cognition Arousal/Alertness: Awake/alert Behavior During Therapy: WFL for tasks assessed/performed Overall Cognitive Status: Within Functional Limits for tasks assessed                                          General Comments General comments (skin integrity, edema, etc.): VSS on RA. L side of face covered with dressing    Exercises     Assessment/Plan    PT Assessment Patient needs continued PT services  PT Problem List Decreased balance;Decreased mobility       PT Treatment Interventions Gait training;Functional mobility training;Therapeutic exercise;Therapeutic activities;Balance training;Patient/family education    PT Goals (Current goals can be found in the Care Plan section)  Acute Rehab PT Goals Patient Stated Goal: to return home PT Goal Formulation: With patient Time For Goal Achievement: 03/04/22 Potential to Achieve Goals: Good    Frequency Min 3X/week     Co-evaluation               AM-PAC PT "6 Clicks" Mobility  Outcome Measure Help needed turning from your back to your side while in a flat bed without using bedrails?: None Help needed moving from lying on your back to  sitting on the side of a flat bed without using bedrails?: None Help needed moving to and from a bed to a chair (including a wheelchair)?: A Little Help needed standing up from a chair using your arms (e.g., wheelchair or bedside chair)?: A Little Help needed to walk in hospital room?: A Little Help needed climbing 3-5 steps with a railing? : A Little 6 Click Score: 20    End of Session Equipment Utilized During Treatment: Gait belt Activity Tolerance: Patient tolerated treatment well Patient left: in chair;with call bell/phone within reach;with family/visitor present;with nursing/sitter in room Nurse Communication: Mobility status PT Visit Diagnosis: Unsteadiness on feet (R26.81);Other abnormalities of gait and mobility (R26.89);History of falling (Z91.81)    Time: 1137-1201 PT Time Calculation (min) (ACUTE ONLY): 24 min   Charges:   PT Evaluation $PT Eval Low Complexity: [place], SPT Acute Rehabilitation Services  Office: [phone]      Mackie Pai 02/18/2022, 1:14 PM

## 2022-02-18 NOTE — Assessment & Plan Note (Addendum)
Secondary to dehydration.  Improving with fluids.  CK only mildly elevated.  -Continue gentle fluid resuscitation -Encourage oral intake -Trend creatinine. - Can discharge once creatinine had come down by half and patient ambulating independently

## 2022-02-18 NOTE — Assessment & Plan Note (Deleted)
Currently is showing no signs of alcohol withdrawal

## 2022-02-18 NOTE — Evaluation (Signed)
Occupational Therapy Evaluation Patient Details Name: [name] MRN: [id number] DOB: Jan 25, 1951 Today's Date: 02/18/2022   History of Present Illness 71 y.o. female presents to [facility] hospital on 02/17/2022 s/p fall, striking head on dresser. Pt underwent laceration repair on 6/6. 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   Clinical Impression   [name] was evaluated s/p the above fall, she is indep at baseline including taking care of her husband. Upon evaluation pt was supervision-min G for all transfers and functional mobility without AD. She is set up for UB ADLs and min G for LB ADLs. Pt reports mild face pain, but otherwise she "feels like myself." Pt will benefit from OT acutely to further assess cognition and progress to indep baseline. Anticipate pt can go home without follow up OT needed.    Recommendations for follow up therapy are one component of a multi-disciplinary discharge planning process, led by the attending physician.  Recommendations may be updated based on patient status, additional functional criteria and insurance authorization.   Follow Up Recommendations  No OT follow up    Assistance Recommended at Discharge Intermittent Supervision/Assistance  Patient can return home with the following A little help with walking and/or transfers;A little help with bathing/dressing/bathroom;Assistance with cooking/housework;Assist for transportation;Help with stairs or ramp for entrance    Functional Status Assessment  Patient has had a recent decline in their functional status and demonstrates the ability to make significant improvements in function in a reasonable and predictable amount of time.  Equipment Recommendations  None recommended by OT    Recommendations for Other Services       Precautions / Restrictions Precautions Precautions: Fall Restrictions Weight Bearing  Restrictions: No      Mobility Bed Mobility Overal bed mobility: Modified Independent                  Transfers Overall transfer level: Needs assistance Equipment used: None Transfers: Sit to/from Stand Sit to Stand: Supervision                  Balance Overall balance assessment: History of Falls, Needs assistance Sitting-balance support: Feet supported Sitting balance-Leahy Scale: Good     Standing balance support: No upper extremity supported Standing balance-Leahy Scale: Fair                             ADL either performed or assessed with clinical judgement   ADL Overall ADL's : Needs assistance/impaired Eating/Feeding: Independent;Sitting   Grooming: Min guard;Standing   Upper Body Bathing: Set up;Sitting   Lower Body Bathing: Min guard;Sit to/from stand   Upper Body Dressing : Set up;Sitting   Lower Body Dressing: Min guard;Sit to/from stand   Toilet Transfer: Min guard;Ambulation   Toileting- Clothing Manipulation and Hygiene: Supervision/safety;Sitting/lateral lean       Functional mobility during ADLs: Min guard General ADL Comments: mildly unsteady with OOB functional tasks     Vision Baseline Vision/History: 0 No visual deficits Vision Assessment?: No apparent visual deficits     Perception     Praxis      Pertinent Vitals/Pain Pain Assessment Pain Assessment: Faces Faces Pain Scale: Hurts a little bit Pain Location: face Pain Descriptors / Indicators: Discomfort Pain Intervention(s): Limited activity within patient's tolerance, Monitored during session     Hand Dominance Right   Extremity/Trunk Assessment Upper Extremity Assessment Upper Extremity Assessment: Overall [facility]  for tasks assessed   Lower Extremity Assessment Lower Extremity Assessment: Defer to PT evaluation   Cervical / Trunk Assessment Cervical / Trunk Assessment: Normal   Communication Communication Communication: No difficulties    Cognition Arousal/Alertness: Awake/alert Behavior During Therapy: WFL for tasks assessed/performed Overall Cognitive Status: Within Functional Limits for tasks assessed                                       General Comments  VSS on RA. L side of face covered with dressing    Exercises     Shoulder Instructions      Home Living Family/patient expects to be discharged to:: Private residence Living Arrangements: Spouse/significant other Available Help at Discharge: Family;Friend(s);Available 24 hours/day Type of Home: House Home Access: Ramped entrance     Home Layout: One level     Bathroom Shower/Tub: Teacher, early years/pre: Standard     Home Equipment: Other (comment) (Disabled husband has a wc, RW, shower chair in the home)   Additional Comments: Pt's siblings from out of state coming to assist, also has supportive friends. Pt's husband is dependent and unable to provide assist      Prior Functioning/Environment Prior Level of Function : Independent/Modified Independent             Mobility Comments: no AD ADLs Comments: indep including being the primary caregiver for her husband who is at [facility] [employer] level adn requires assist for all aspects of care        OT Problem List: Impaired balance (sitting and/or standing);Decreased safety awareness      OT Treatment/Interventions: Self-care/ADL training;Therapeutic exercise;DME and/or AE instruction;Balance training;Therapeutic activities    OT Goals(Current goals can be found in the care plan section) Acute Rehab OT Goals Patient Stated Goal: home soon OT Goal Formulation: With patient Time For Goal Achievement: 03/04/22 Potential to Achieve Goals: Good ADL Goals Pt Will Perform Upper Body Dressing: Independently;sitting Pt Will Perform Lower Body Dressing: Independently;sit to/from stand Pt Will Transfer to Toilet: Independently;ambulating Additional ADL Goal #1: Pt will independently  complete IADL medication managment task Additional ADL Goal #2: Pt will tolerate at least 8 minutes of OOB standing functional activity  OT Frequency: Min 2X/week    Co-evaluation              AM-PAC OT "6 Clicks" Daily Activity     Outcome Measure Help from another person eating meals?: None Help from another person taking care of personal grooming?: A Little Help from another person toileting, which includes using toliet, bedpan, or urinal?: A Little Help from another person bathing (including washing, rinsing, drying)?: A Little Help from another person to put on and taking off regular upper body clothing?: A Little Help from another person to put on and taking off regular lower body clothing?: A Little 6 Click Score: 19   End of Session Nurse Communication: Mobility status  Activity Tolerance: Patient tolerated treatment well Patient left: in chair;with call bell/phone within reach;with chair alarm set  OT Visit Diagnosis: Unsteadiness on feet (R26.81);History of falling (Z91.81)                Time: 6213-0865 OT Time Calculation (min): 31 min Charges:  OT General Charges $OT Visit: 1 Visit OT Evaluation $OT Eval Moderate Complexity: 1 Mod OT Treatments $Self Care/Home Management : 8-22 mins  Deyona Soza A Ieesha Abbasi 02/18/2022, 11:51 AM

## 2022-02-18 NOTE — Progress Notes (Signed)
NAME:  [name], MRN:  [id number], DOB:  26-Aug-1951, LOS: 1 ADMISSION DATE:  02/17/2022, CONSULTATION DATE:  6/6 REFERRING MD:  Vanita Panda, CHIEF COMPLAINT:  fall   History of Present Illness:  [name], is a 71 y.o. female, who presented to the [facility] ED with a chief complaint of fall.   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  Patient fell overnight 6/5-6 and hit head on her dresser while wearing her glasses. Denies LOC. Reports she "bled everywhere and cut the flap of skin off at home".   ED course was notable for hypotension (74/43), AKI, 3L IVF was administered, TMAX 100.1 WBC 13.3, lactate 2.1. Head CT negative for acute change  PCCM was consulted for admission.  Pertinent  Medical History  Systolic and diastolic HF s/p pacemaker placement, neurogenic bladder with home cathing, chronic pain, GERD   Significant Hospital Events: Including procedures, antibiotic start and stop dates in addition to other pertinent events   6/6 Present to [facility] post fall, txr to [facility]. To OR for laceration repair  Interim History / Subjective:  Afebrile  Pt reports pain in left hip, face Cultures pending  I/O 2.1L UOP, +3.7L in last 24h   Objective   Blood pressure 112/61, pulse 90, temperature 98.1 F (36.7 C), temperature source Oral, resp. rate 18, height '5\' 1"'$  (1.549 m), weight 67.1 kg, SpO2 96 %.        Intake/Output Summary (Last 24 hours) at 02/18/2022 0815 Last data filed at 02/18/2022 0600 Gross per 24 hour  Intake 5870.06 ml  Output 2150 ml  Net 3720.06 ml   Filed Weights   02/17/22 0757  Weight: 67.1 kg    Examination: General: ill appearing adult female lying in bed in NAD HEENT: MM pink/moist, anicteric, left facial dressing intact, left eye sutured closed Neuro: Awake, alert, oriented to self/place, speech clear, MAE CV: s1s2 RRR, no m/r/g PULM: non-labored at rest, lungs  bilaterally clear with good air entry  GI: soft, bsx4 active  Extremities: warm/dry, no edema, left hip tenderness to palpation but no deformity, bruising to bilateral knees Skin: as above, scattered bruising     CT head/CSP/maxillofacial: No acute intracranial abnormalties, LEFT facial laceration with soft tissue gas LEFT periorbital extending into pre maxillary region. C5-C6 degenerative disc disease.    Resolved Hospital Problem list     Assessment & Plan:   Shock, hypovolemic +/- component of sepsis in setting of UTI Lactic Acidosis  Hypotensive on admit, responded to IVF.  UA + for UTI.  Additional component of narcotic use may have contributed to BP.  Initial mild elevation of lactate/temp.  Off pressors.  -lactic acid cleared, off vasopressors  -follow urine culture  -await ECHO findings  -advance diet   Left facial laceration Secondary to fall. Head CT negative. -appreciate Dr. Dillingham/Plastics assistance with patient care  -KY jelly to upper portion of wound near eye, bacitracin on sutured area of cheek, green portion of dressing is sutured to face -ok for diet per plastics, may need soft foods due to soreness from fall   Systolic and diastolic HF s/p pacemaker placement -hold home GDMT, lasix, antihypertensives  -await ECHO   AKI NGMA HX neurogenic bladder requiring self cath BUN 67, creat 5.5.9 (Baseline 1.38-1.6), suspect prerenal secondary to hypotension/volume loss. -Trend BMP / urinary output -Replace electrolytes as indicated -Avoid nephrotoxic agents, ensure adequate renal perfusion -add sodium bicarbonate at 50  ml/hr x1L  Acute blood loss anemia Suspect secondary to fall. -follow CBC  -transfuse for Hgb <7% or active bleeding   Transaminitis Suspect in setting of hypoperfusion  -follow trend  DM2 Per DM coordinator note, not talking home lantus.  Hgb A1c 7.3 on admit.  -SSI, sensitive scale  -HS sliding scale   Hyponatremia NA 132 -follow  trend -sodium bicarbonate as above   Fall Hip Pain Suspect multifactorial in setting of volume depletion, AKI, narcotics, possible UTI -PT/OT  -fall precautions  -pelvis XR on 6/6 without acute findings. Likely soft tissue soreness.   GERD -PPI  Chronic pain UDS positive for opiates -PRN tylenol  -cautious oxycodone use with AKI  HX ETOH use -CIWA   Depression -continue zoloft, wellbutrin  -benzo's on home med list but not positive on admit   Best Practice (right click and "Reselect all SmartList Selections" daily)  Diet/type: Regular consistency (see orders) DVT prophylaxis: prophylactic heparin  GI prophylaxis: PPI Lines: N/A Foley:  N/A Code Status:  full code Last date of multidisciplinary goals of care discussion: full code, further discussion pending   Critical care time: 32 minutes    Noe Gens, MSN, APRN, NP-C, AGACNP-BC [facility] 02/18/2022, 8:15 AM   Please see [email or web address] for pager details.   From 7A-7P if no response, please call [phone] After hours, please call ELink [phone]

## 2022-02-18 NOTE — Progress Notes (Signed)
   Subjective:    Patient ID: [name], female    DOB: 25-Jan-1951, 71 y.o.   MRN: [id number]  The patient is a 71 yrs old female here for treatment of her facial laceration.  The cheek flap looks viable with good color.  Her left eye looks slightly irritated.  She denies pain.  Incisions intact. No sign of infection or hematoma.          Objective:   Physical Exam HENT:     Head:   Cardiovascular:     Rate and Rhythm: Normal rate.  Pulmonary:     Effort: Pulmonary effort is normal.  Skin:    General: Skin is warm.     Capillary Refill: Capillary refill takes less than 2 seconds.     Coloration: Skin is not pale.     Findings: Bruising present.  Neurological:     Mental Status: She is alert.          Assessment & Plan:     ICD-10-CM   1. Fall, initial encounter  W19.XXXA     2. AKI (acute kidney injury) ([place])  N17.9     3. Avulsion of skin of face, initial encounter  S01.80XA     4. Facial trauma, initial encounter  S09.93XA       Bactroban to cheek daily. Erythromycin oph oint to left eye daily KY gel to lateral nose daily  Follow up in clinic in 10 days.

## 2022-02-18 NOTE — Progress Notes (Addendum)
CK reviewed > 1,523.     C-Diff PCR collected at [facility] returned > antigen positive, toxin negative, PCR negative. H&P reviewed with noted diarrhea, N/V prior to admit.  None overnight, then am of 6/7 had three smears.  One enough to collect sample. Zar score 2 (only given credit for physically being in ICU) but could argue for 0. She is afebrile and no leukocytosis.    Rhabdomyolysis  C-Diff Colonization  Plan: -continue sodium bicarbonate infusion at 50 ml/hr -hold statin -hold treatment for C-Diff for now.  If develops significant symptoms, increasing diarrhea, fever, or leukocytosis consider initiating therapy with fidaxomicin.  -additional NS x1 now over 2 hours, then add 50 ml NS at 50 ml/hr       Noe Gens, MSN, APRN, NP-C, AGACNP-BC [facility] 02/18/2022, 2:14 PM   Please see [email or web address] for pager details.   From 7A-7P if no response, please call [phone] After hours, please call ELink [phone]

## 2022-02-18 NOTE — Assessment & Plan Note (Signed)
Facial laceration sutured by plastic surgery.

## 2022-02-18 NOTE — Progress Notes (Signed)
  Echocardiogram 2D Echocardiogram has been performed.  Bobbye Charleston 02/18/2022, 10:19 AM

## 2022-02-18 NOTE — Assessment & Plan Note (Signed)
Patient received C difficile for loose stool but not frank diarrhea.  C.diff antigen positive but toxin negative consistent with carrier state.  -Hold on treatment unless develops worsening symptoms.

## 2022-02-18 NOTE — TOC CAGE-AID Note (Signed)
Transition of Care [facility]) - CAGE-AID Screening   Patient Details  Name: [name] MRN: [id number] Date of Birth: 1951/04/05  Transition of [name], [name] Phone Number: 02/18/2022, 12:28 PM   Clinical Narrative: Pt participated in [place].  Pt stated she does not use substance, but drinks ETOH.  Pt was offered resources, due to usage of ETOH.     Naif Alabi Tarpley-Carter, MSW, LCSW-A Pronouns:  She/Her/Hers [facility]itions of Care Clinical Social Worker Direct Number:  [phone] [email or web address]   CAGE-AID Screening:    Have You Ever Felt You Ought to Cut Down on Your Drinking or Drug Use?: No Have People Annoyed You By [employer] Your Drinking Or Drug Use?: No Have You Felt Bad Or Guilty About Your Drinking Or Drug Use?: No Have You Ever Had a Drink or Used Drugs First Thing In The Morning to Steady Your Nerves or to Get Rid of a Hangover?: No CAGE-AID Score: 0  Substance Abuse Education Offered: Yes  Substance abuse interventions: Scientist, clinical (histocompatibility and immunogenetics)

## 2022-02-19 DIAGNOSIS — S0180XA Unspecified open wound of other part of head, initial encounter: Secondary | ICD-10-CM

## 2022-02-19 LAB — CBC
HCT: 29.7 % — ABNORMAL LOW (ref 36.0–46.0)
Hemoglobin: 9.5 g/dL — ABNORMAL LOW (ref 12.0–15.0)
MCH: 30.7 pg (ref 26.0–34.0)
MCHC: 32 g/dL (ref 30.0–36.0)
MCV: 96.1 fL (ref 80.0–100.0)
Platelets: 130 10*3/uL — ABNORMAL LOW (ref 150–400)
RBC: 3.09 MIL/uL — ABNORMAL LOW (ref 3.87–5.11)
RDW: 14.6 % (ref 11.5–15.5)
WBC: 5.8 10*3/uL (ref 4.0–10.5)
nRBC: 0 % (ref 0.0–0.2)

## 2022-02-19 LAB — COMPREHENSIVE METABOLIC PANEL
ALT: 29 U/L (ref 0–44)
AST: 95 U/L — ABNORMAL HIGH (ref 15–41)
Albumin: 2.9 g/dL — ABNORMAL LOW (ref 3.5–5.0)
Alkaline Phosphatase: 165 U/L — ABNORMAL HIGH (ref 38–126)
Anion gap: 15 (ref 5–15)
BUN: 47 mg/dL — ABNORMAL HIGH (ref 8–23)
CO2: 18 mmol/L — ABNORMAL LOW (ref 22–32)
Calcium: 8.2 mg/dL — ABNORMAL LOW (ref 8.9–10.3)
Chloride: 108 mmol/L (ref 98–111)
Creatinine, Ser: 3.44 mg/dL — ABNORMAL HIGH (ref 0.44–1.00)
GFR, Estimated: 14 mL/min — ABNORMAL LOW (ref >60)
Glucose, Bld: 183 mg/dL — ABNORMAL HIGH (ref 70–99)
Potassium: 3.1 mmol/L — ABNORMAL LOW (ref 3.5–5.1)
Sodium: 141 mmol/L (ref 135–145)
Total Bilirubin: 0.5 mg/dL (ref 0.3–1.2)
Total Protein: 6.3 g/dL — ABNORMAL LOW (ref 6.5–8.1)

## 2022-02-19 LAB — GASTROINTESTINAL PANEL BY PCR, STOOL (REPLACES STOOL CULTURE)

## 2022-02-19 LAB — GLUCOSE, CAPILLARY
Glucose-Capillary: 188 mg/dL — ABNORMAL HIGH (ref 70–99)
Glucose-Capillary: 172 mg/dL — ABNORMAL HIGH (ref 70–99)
Glucose-Capillary: 115 mg/dL — ABNORMAL HIGH (ref 70–99)
Glucose-Capillary: 162 mg/dL — ABNORMAL HIGH (ref 70–99)

## 2022-02-19 MED ORDER — POTASSIUM CHLORIDE CRYS ER 20 MEQ PO TBCR
20.0000 meq | EXTENDED_RELEASE_TABLET | Freq: Once | ORAL | Status: AC
  Administered 2022-02-19: 20 meq via ORAL
  Filled 2022-02-19: qty 1

## 2022-02-19 MED ORDER — INSULIN GLARGINE-YFGN 100 UNIT/ML ~~LOC~~ SOLN
10.0000 [IU] | Freq: Every day | SUBCUTANEOUS | Status: DC
  Administered 2022-02-19 – 2022-02-21 (×3): 10 [IU] via SUBCUTANEOUS
  Filled 2022-02-19 (×5): qty 0.1

## 2022-02-19 MED ORDER — POTASSIUM CHLORIDE CRYS ER 20 MEQ PO TBCR
40.0000 meq | EXTENDED_RELEASE_TABLET | Freq: Once | ORAL | Status: AC
  Administered 2022-02-19: 40 meq via ORAL
  Filled 2022-02-19: qty 2

## 2022-02-19 NOTE — Progress Notes (Addendum)
NAME:  [name], MRN:  [id number], DOB:  27-Oct-1950, LOS: 2 ADMISSION DATE:  02/17/2022, CONSULTATION DATE:  6/6 REFERRING MD:  Vanita Panda, CHIEF COMPLAINT:  fall   History of Present Illness:  [name], is a 71 y.o. female, who presented to the [facility] with a chief complaint of fall.   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  Patient fell overnight 6/5-6 and hit head on her dresser while wearing her glasses. Denies LOC. Reports she "bled everywhere and cut the flap of skin off at home".   ED course was notable for hypotension (74/43), AKI, 3L IVF was administered, TMAX 100.1 WBC 13.3, lactate 2.1. Head CT negative for acute change  PCCM was consulted for admission.  Pertinent  Medical History  Systolic and diastolic HF s/p pacemaker placement, neurogenic bladder with home cathing, chronic pain, GERD   Significant Hospital Events: Including procedures, antibiotic start and stop dates in addition to other pertinent events   6/6 Present to [facility] post [facility], [facility] to [facility]. To OR for laceration repair 6/7afebrile, sCr improving, tx out of ICU  Interim History / Subjective:  C/o of headache, since improving after meds Denies any withdrawal symptoms No significant abd pain or diarrhea Afebrile Patient vocalizes significant caregiver burden stress   Objective   Blood pressure (!) 114/96, pulse (!) 124, temperature 99.6 F (37.6 C), temperature source Oral, resp. rate 20, height _0  (1.549 m), weight 68.5 kg, SpO2 94 %.        Intake/Output Summary (Last 24 hours) at 02/19/2022 0923 Last data filed at 02/19/2022 6237 Gross per 24 hour  Intake 2231.18 ml  Output 4850 ml  Net -2618.82 ml   Filed Weights   02/17/22 0757 02/18/22 2127 02/19/22 0326  Weight: 67.1 kg 68.8 kg 68.5 kg    Examination: General:  Older appearing female sitting upright in bed in NAD HEENT: MM pink/moist,  anicteric, bruising to left forehead, facial dressing to left cheek extending over nasal bridge intact Neuro:  Alert oriented x 3, MAE CV: rr, ST PULM:  non labored, CTA GI: soft, bs+, NT, ND, foley  Extremities: warm/dry, no LE edema  Skin: no rashes, scattered bruising to B knees  Labs>  WBC 5.8 Hgb 8.9> 9.5 Plts 123> 130 K 3.1 Bicarb 13> 18 sCr 4.88> 3.44 BUN 58> 47 Alk phos, AST/ ALT improving   UOP 4.8L / 24 hrs Net +1.1L  CBG range> improving 264> 188  6/6 CT head/CSP/maxillofacial: No acute intracranial abnormalties, LEFT facial laceration with soft tissue gas LEFT periorbital extending into pre maxillary region. C5-C6 degenerative disc disease.   6/7 TTE> EF 55-60%, no WMA, G1DD, normal RV/ PASP, mild to moderate TR   Resolved Hospital Problem list     Assessment & Plan:   Shock, hypovolemic +/- component of sepsis in setting of UTI, resolved  Lactic Acidosis  Hypotensive on admit, responded to IVF.  UA + for UTI.  Additional component of narcotic use may have contributed to BP.  Initial mild elevation of lactate/temp.  Off pressors.  - remains hemodynamically stable.  More tachycardic today but anxiety seems to be contributing along with CIWA scores slowly rising  - UC neg - continue ceftriaxone for 3 days of therapy  - TTE as above - d/c MIVF given good oral intake.  Cont carb modified diet  Left facial laceration Secondary to fall. Head CT negative. -appreciate Dr. Dillingham/Plastics  assistance with patient care.  To follow up in clinic in 10 days - wound care> bactroban to cheek daily, erythromycin to left eye daily, KY gel to lateral nose daily.     Systolic and diastolic HF s/p pacemaker placement - continue to hold GDMT, lasix, antihypertensives > normotensive - TTE as above  AKI NGMA HX neurogenic bladder requiring self cath Hypokalemia  Rhabdomyolysis  BUN 67, creat 5.5.9 (Baseline 1.38-1.6), suspect prerenal secondary to hypotension/volume  loss. - stop MIVF as is oral intake is picking up   - S/p 1L of bicarb at 50 ml/hr, likely contributing to hypokalemia - recheck CK in am - Trend BMP / urinary output - uop picking up, trend  - continue foley for now> states she self caths 10 times a day at home.  Likely still too sore to self cath - KCL 40 meq now and 20 meq this afternoon - Replace electrolytes as indicated - Avoid nephrotoxic agents, ensure adequate renal perfusion  Acute blood loss anemia Suspect secondary to fall. - H/H stable - transfuse for Hgb <7% or active bleeding   Transaminitis Suspect in setting of hypoperfusion  - LFTs improving  DM2 Per DM coordinator note, not talking home lantus.  Hgb A1c 7.3 on admit.  - SSI, sensitive scale  - HS sliding scale   Hyponatremia - normalized, trend BMET  Fall Hip Pain Suspect multifactorial in setting of volume depletion, AKI, narcotics, possible UTI - PT/OT > cleared, no recs for home - fall precautions  - pelvis XR on 6/6 without acute findings. Likely soft tissue soreness.   GERD - PPI  Chronic pain UDS positive for opiates - PRN tylenol  - cautious oxycodone use with AKI  HX ETOH use At risk for ETOH withdrawal - CIWA with ativan with MVI/ folate/ thiamine  Depression - continue zoloft, wellbutrin  - benzo's on home med list but not positive on admit.  Patient states she takes them daily but then said not every day after revealing her UDS was negative, states her husband takes them as well.   Patient expresses severe caregiver burden likely contributing to above.  States she is the primary caregiver for her husband and that he refuses to go to assisted living/ SNF.  Have asked if TOC has any resources we can assist her with in trying to find help with this.    Cdiff colonization - antigen and PCR positive, toxin negative without symptoms> no abd pain, fever, normal WBC, some stools overnight> but type not recorded - monitor closely for now.  If  she becomes symptomatic> consider treatment with fidaxomicin  Best Practice (right click and "Reselect all SmartList Selections" daily)  Diet/type: Regular consistency (see orders) DVT prophylaxis: prophylactic heparin  GI prophylaxis: PPI Lines: N/A Foley:  Yes, and it is still needed Code Status:  full code Last date of multidisciplinary goals of care discussion: full code, further discussion pending   Patient signed out to [facility] for pick up on 6/9.  PCCM will be available as needed.   Critical care time: n/a       Kennieth Rad, ACNP  Pulmonary & Critical Care 02/19/2022, 9:23 AM  See Amion for pager If no response to pager, please call PCCM consult pager After 7:00 pm call Elink

## 2022-02-19 NOTE — Progress Notes (Addendum)
Pt became progressively more boisterous and disoriented throughout the day, at times hallucinating and attempting to get out of the bed. This is a change from this am, when pt was orientedx4. This was communicated to Jennelle Human, NP. Orders for frequent CIWA monitoring, and this RN gave Ativan as well.   I had a lengthy conversation with the pt's sister, Shauna Hugh. Pt's sister stated that the pt and her husband are both alcoholics and drink hard liquor, specifically vodka. Pt's husband is disabled and an amputee, who will not use a prosthetic. Per sister, when the pt fell she bled out and was screaming and the husband did not help her. Pt's neighbor brought her in, who is also a "drinking buddy". The pt also had a son die in a car accident at the age of 38 about 30 years ago, with alcohol and drug involvement. Pt's sister is concerned about pt and about home support post-discharge. Pt requested that her sister, Margaretha Sheffield, be the primary one to communicate with physicians. Will move her name to the top of the contact list in Epic.   Justice Rocher, RN

## 2022-02-20 ENCOUNTER — Encounter (INDEPENDENT_AMBULATORY_CARE_PROVIDER_SITE_OTHER): Payer: Medicare Other

## 2022-02-20 DIAGNOSIS — I10 Essential (primary) hypertension: Secondary | ICD-10-CM

## 2022-02-20 DIAGNOSIS — I9589 Other hypotension: Secondary | ICD-10-CM

## 2022-02-20 DIAGNOSIS — E119 Type 2 diabetes mellitus without complications: Secondary | ICD-10-CM

## 2022-02-20 DIAGNOSIS — I255 Ischemic cardiomyopathy: Secondary | ICD-10-CM

## 2022-02-20 DIAGNOSIS — E872 Acidosis, unspecified: Secondary | ICD-10-CM

## 2022-02-20 DIAGNOSIS — G8929 Other chronic pain: Secondary | ICD-10-CM

## 2022-02-20 DIAGNOSIS — S0993XA Unspecified injury of face, initial encounter: Secondary | ICD-10-CM | POA: Diagnosis not present

## 2022-02-20 DIAGNOSIS — T796XXA Traumatic ischemia of muscle, initial encounter: Secondary | ICD-10-CM

## 2022-02-20 DIAGNOSIS — N390 Urinary tract infection, site not specified: Secondary | ICD-10-CM

## 2022-02-20 DIAGNOSIS — R571 Hypovolemic shock: Secondary | ICD-10-CM | POA: Diagnosis not present

## 2022-02-20 DIAGNOSIS — M545 Low back pain, unspecified: Secondary | ICD-10-CM

## 2022-02-20 DIAGNOSIS — E861 Hypovolemia: Secondary | ICD-10-CM

## 2022-02-20 DIAGNOSIS — E876 Hypokalemia: Secondary | ICD-10-CM

## 2022-02-20 DIAGNOSIS — D696 Thrombocytopenia, unspecified: Secondary | ICD-10-CM

## 2022-02-20 DIAGNOSIS — M6282 Rhabdomyolysis: Secondary | ICD-10-CM

## 2022-02-20 DIAGNOSIS — N3000 Acute cystitis without hematuria: Secondary | ICD-10-CM

## 2022-02-20 DIAGNOSIS — G9341 Metabolic encephalopathy: Secondary | ICD-10-CM | POA: Diagnosis not present

## 2022-02-20 DIAGNOSIS — Z794 Long term (current) use of insulin: Secondary | ICD-10-CM

## 2022-02-20 DIAGNOSIS — D62 Acute posthemorrhagic anemia: Secondary | ICD-10-CM

## 2022-02-20 DIAGNOSIS — F10932 Alcohol use, unspecified with withdrawal with perceptual disturbance: Secondary | ICD-10-CM

## 2022-02-20 DIAGNOSIS — I639 Cerebral infarction, unspecified: Secondary | ICD-10-CM

## 2022-02-20 DIAGNOSIS — Z221 Carrier of other intestinal infectious diseases: Secondary | ICD-10-CM

## 2022-02-20 LAB — COMPREHENSIVE METABOLIC PANEL
ALT: 24 U/L (ref 0–44)
AST: 57 U/L — ABNORMAL HIGH (ref 15–41)
Albumin: 2.6 g/dL — ABNORMAL LOW (ref 3.5–5.0)
Alkaline Phosphatase: 134 U/L — ABNORMAL HIGH (ref 38–126)
Anion gap: 11 (ref 5–15)
BUN: 27 mg/dL — ABNORMAL HIGH (ref 8–23)
CO2: 22 mmol/L (ref 22–32)
Calcium: 8.6 mg/dL — ABNORMAL LOW (ref 8.9–10.3)
Chloride: 109 mmol/L (ref 98–111)
Creatinine, Ser: 2.44 mg/dL — ABNORMAL HIGH (ref 0.44–1.00)
GFR, Estimated: 21 mL/min — ABNORMAL LOW (ref >60)
Glucose, Bld: 130 mg/dL — ABNORMAL HIGH (ref 70–99)
Potassium: 3.1 mmol/L — ABNORMAL LOW (ref 3.5–5.1)
Sodium: 142 mmol/L (ref 135–145)
Total Bilirubin: 0.5 mg/dL (ref 0.3–1.2)
Total Protein: 6 g/dL — ABNORMAL LOW (ref 6.5–8.1)

## 2022-02-20 LAB — CUP PACEART REMOTE DEVICE CHECK
Battery Remaining Longevity: 75 mo
Battery Voltage: 2.99 V
Brady Statistic AP VP Percent: 0.01 %
Brady Statistic AP VS Percent: 0.02 %
Brady Statistic AS VP Percent: 98.38 %
Brady Statistic AS VS Percent: 1.59 %
Brady Statistic RA Percent Paced: 0.03 %
Brady Statistic RV Percent Paced: 20.41 %
Date Time Interrogation Session: 20230608210010
Implantable Lead Implant Date: 20060214
Implantable Lead Implant Date: 20150406
Implantable Lead Implant Date: 20150406
Implantable Lead Location: 753858
Implantable Lead Location: 753859
Implantable Lead Location: 753860
Implantable Lead Model: 4396
Implantable Lead Model: 5076
Implantable Pulse Generator Implant Date: 20210610
Lead Channel Impedance Value: 1235 Ohm
Lead Channel Impedance Value: 1349 Ohm
Lead Channel Impedance Value: 323 Ohm
Lead Channel Impedance Value: 342 Ohm
Lead Channel Impedance Value: 399 Ohm
Lead Channel Impedance Value: 475 Ohm
Lead Channel Impedance Value: 532 Ohm
Lead Channel Impedance Value: 665 Ohm
Lead Channel Impedance Value: 836 Ohm
Lead Channel Pacing Threshold Amplitude: 1.25 V
Lead Channel Pacing Threshold Amplitude: 1.375 V
Lead Channel Pacing Threshold Amplitude: 1.875 V
Lead Channel Pacing Threshold Pulse Width: 0.4 ms
Lead Channel Pacing Threshold Pulse Width: 0.4 ms
Lead Channel Pacing Threshold Pulse Width: 0.8 ms
Lead Channel Sensing Intrinsic Amplitude: 1.75 mV
Lead Channel Sensing Intrinsic Amplitude: 1.75 mV
Lead Channel Sensing Intrinsic Amplitude: 5.125 mV
Lead Channel Sensing Intrinsic Amplitude: 5.125 mV
Lead Channel Setting Pacing Amplitude: 2 V
Lead Channel Setting Pacing Amplitude: 2.5 V
Lead Channel Setting Pacing Amplitude: 2.75 V
Lead Channel Setting Pacing Pulse Width: 0.4 ms
Lead Channel Setting Pacing Pulse Width: 0.8 ms
Lead Channel Setting Sensing Sensitivity: 0.9 mV

## 2022-02-20 LAB — GLUCOSE, CAPILLARY
Glucose-Capillary: 137 mg/dL — ABNORMAL HIGH (ref 70–99)
Glucose-Capillary: 131 mg/dL — ABNORMAL HIGH (ref 70–99)
Glucose-Capillary: 95 mg/dL (ref 70–99)
Glucose-Capillary: 186 mg/dL — ABNORMAL HIGH (ref 70–99)

## 2022-02-20 LAB — CBC
HCT: 27.3 % — ABNORMAL LOW (ref 36.0–46.0)
Hemoglobin: 9 g/dL — ABNORMAL LOW (ref 12.0–15.0)
MCH: 31.3 pg (ref 26.0–34.0)
MCHC: 33 g/dL (ref 30.0–36.0)
MCV: 94.8 fL (ref 80.0–100.0)
Platelets: 141 10*3/uL — ABNORMAL LOW (ref 150–400)
RBC: 2.88 MIL/uL — ABNORMAL LOW (ref 3.87–5.11)
RDW: 14.2 % (ref 11.5–15.5)
WBC: 4.7 10*3/uL (ref 4.0–10.5)
nRBC: 0 % (ref 0.0–0.2)

## 2022-02-20 LAB — MAGNESIUM: Magnesium: 1.8 mg/dL (ref 1.7–2.4)

## 2022-02-20 LAB — AMMONIA: Ammonia: 28 umol/L (ref 9–35)

## 2022-02-20 LAB — PHOSPHORUS: Phosphorus: 2.7 mg/dL (ref 2.5–4.6)

## 2022-02-20 LAB — CK: Total CK: 163 U/L (ref 38–234)

## 2022-02-20 MED ORDER — POTASSIUM CHLORIDE 10 MEQ/100ML IV SOLN
10.0000 meq | INTRAVENOUS | Status: AC
  Administered 2022-02-20 (×3): 10 meq via INTRAVENOUS
  Filled 2022-02-20 (×4): qty 100

## 2022-02-20 MED ORDER — THIAMINE HCL 100 MG/ML IJ SOLN
250.0000 mg | Freq: Three times a day (TID) | INTRAVENOUS | Status: AC
  Administered 2022-02-21 – 2022-02-22 (×3): 250 mg via INTRAVENOUS
  Filled 2022-02-20 (×3): qty 2.5

## 2022-02-20 MED ORDER — LORAZEPAM 2 MG/ML IJ SOLN
1.0000 mg | INTRAMUSCULAR | Status: DC | PRN
  Administered 2022-02-20 (×2): 2 mg via INTRAVENOUS
  Administered 2022-02-21: 4 mg via INTRAVENOUS
  Administered 2022-02-21 (×2): 2 mg via INTRAVENOUS
  Filled 2022-02-20: qty 2
  Filled 2022-02-20 (×4): qty 1

## 2022-02-20 MED ORDER — THIAMINE HCL 100 MG/ML IJ SOLN
500.0000 mg | Freq: Three times a day (TID) | INTRAMUSCULAR | Status: AC
  Administered 2022-02-20 – 2022-02-21 (×3): 500 mg via INTRAVENOUS
  Filled 2022-02-20 (×3): qty 5

## 2022-02-20 MED ORDER — THIAMINE HCL 100 MG PO TABS
100.0000 mg | ORAL_TABLET | Freq: Every day | ORAL | Status: DC
  Administered 2022-02-23 – 2022-03-04 (×10): 100 mg via ORAL
  Filled 2022-02-20 (×10): qty 1

## 2022-02-20 MED ORDER — LORAZEPAM 1 MG PO TABS: 1.0000 mg | ORAL_TABLET | ORAL | Status: DC | PRN

## 2022-02-20 MED ORDER — POTASSIUM CHLORIDE 2 MEQ/ML IV SOLN
INTRAVENOUS | Status: AC
  Filled 2022-02-20 (×2): qty 1000

## 2022-02-20 MED ORDER — VANCOMYCIN HCL 125 MG PO CAPS
125.0000 mg | ORAL_CAPSULE | Freq: Four times a day (QID) | ORAL | Status: AC
  Administered 2022-02-20 – 2022-03-02 (×38): 125 mg via ORAL
  Filled 2022-02-20 (×40): qty 1

## 2022-02-20 MED ORDER — CARVEDILOL 6.25 MG PO TABS
6.2500 mg | ORAL_TABLET | Freq: Two times a day (BID) | ORAL | Status: DC
Start: 2022-02-20 — End: 2022-02-26
  Administered 2022-02-20 – 2022-02-25 (×11): 6.25 mg via ORAL
  Filled 2022-02-20 (×11): qty 1

## 2022-02-20 NOTE — Assessment & Plan Note (Addendum)
Recent Labs    02/17/22 0756 02/17/22 0808 02/18/22 0031 02/19/22 0355 02/20/22 1125  BUN 67* 66* 58* 47* 27*  CREATININE 5.58* 6.40* 4.88* 3.44* 2.44*  Baseline Cr 1.3-1.5.  Improving. -Continue IV fluid -Avoid nephrotoxins.

## 2022-02-20 NOTE — Assessment & Plan Note (Signed)
BP within acceptable range for most part. -Resume home Coreg

## 2022-02-20 NOTE — Assessment & Plan Note (Signed)
IV KCl 10 mill equivalent x4 IV LR with KCl

## 2022-02-20 NOTE — Assessment & Plan Note (Signed)
Resolved

## 2022-02-20 NOTE — Assessment & Plan Note (Signed)
Likely hypovolemic in the setting of dehydration, alcohol abuse and acute blood loss.  Resolved.  TTE with LVEF of 55 to 60%, G1 DD, normal RV/PASP and no WMA.  -Start gentle IV fluid due to poor p.o. intake in the setting of encephalopathy and alcohol withdrawal.

## 2022-02-20 NOTE — Assessment & Plan Note (Signed)
No focal neuro symptoms but limited exam due to mental status.  CT head without acute finding. -Continue home medications when able to take p.o.

## 2022-02-20 NOTE — Assessment & Plan Note (Addendum)
Patient with neurogenic bladder and intermittent self-catheterization.  She is at risk for UTI.  UA with bacteria and pyuria.  Surprisingly urine culture is negative.  -Discontinue ceftriaxone given C. difficile.  Seems she already had 4 doses

## 2022-02-20 NOTE — Assessment & Plan Note (Signed)
A1c 7.3%. Recent Labs  Lab 02/19/22 1115 02/19/22 1558 02/19/22 2129 02/20/22 0851 02/20/22 1139  GLUCAP 172* 115* 162* 137* 131*  -Continue Semglee 10 units daily -Continue SSI-sensitive

## 2022-02-20 NOTE — Assessment & Plan Note (Addendum)
In the setting of fall.  CT head without acute finding. -S/p complex laceration repair on 6/8 by plastic surgery. -Bactroban to cheek daily, Erythromycin oph oint to left eye daily, KY gel to lateral nose daily and outpatient follow-up in 10 days -Fall precaution -PT/OT eval

## 2022-02-20 NOTE — Hospital Course (Addendum)
71 year old F with PMH of EtOH abuse, combined CHF s/p BIV PPM, CVA, DM-2, CKD-3B, neurogenic bladder with self-catheterization, chronic pain, GERD, anxiety, insomnia and depression presenting to [facility] ED after a fall at home the night of 6/5-6 hitting her head against a dresser while wearing her glasses.  Denies LOC.  Reportedly, "bled everywhere and cut the flap of skin off at home".  In ED, she was hypotensive to 74/44. Cr 5.6.  BUN 67.  Lactate 2.1.  Head CT negative for acute finding.  She was started on regular IV fluid and vasopressors and admitted to ICU for hypovolemic shock, AKI and acute blood loss anemia.  Patient had laceration repair by plastic surgery.  She came off vasopressors.  She is transferred to hospitalist service on 6/9.  She is currently undergoing alcohol withdrawal.

## 2022-02-20 NOTE — Assessment & Plan Note (Signed)
See alcohol withdrawal syndrome.

## 2022-02-20 NOTE — Assessment & Plan Note (Signed)
Now on IV Ativan with CIWA. -Resume home medications when able to take p.o.

## 2022-02-20 NOTE — TOC Initial Note (Signed)
Transition of Care [facility]) - Initial/Assessment Note    Patient Details  Name: [name] MRN: [id number] Date of Birth: 08/26/1951  Transition of Care [facility]) CM/SW Contact:    Angelita Ingles, RN Phone Number:[phone]  02/20/2022, 2:10 PM  Clinical Narrative:                 [facility] consulted for "Can we give her resources to assist her in getting help at home to provide care for her husband, as it currently all falls on her.  Significant caregiver burden/ stress."  CM gathered resources for mental health, therapy and counseling and a list of private duty agencies that may be able to assist with care. TOC at bedside to present patient with resources. Patient is actively yelling out does not respond appropiately to questions and can not engage in conversation. TOC will follow and discuss resources when patient is more coherent.         Patient Goals and CMS Choice        Expected Discharge Plan and Services                                                Prior Living Arrangements/Services                       Activities of Daily Living Home Assistive Devices/Equipment: None ADL Screening (condition at time of admission) Patient's cognitive ability adequate to safely complete daily activities?: Yes Is the patient deaf or have difficulty hearing?: No Does the patient have difficulty seeing, even when wearing glasses/contacts?: No Does the patient have difficulty concentrating, remembering, or making decisions?: No Patient able to express need for assistance with ADLs?: Yes Does the patient have difficulty dressing or bathing?: No Independently performs ADLs?: Yes (appropriate for developmental age) Does the patient have difficulty walking or climbing stairs?: No Weakness of Legs: None Weakness of Arms/Hands: None  Permission Sought/Granted                  Emotional Assessment              Admission diagnosis:  AKI (acute kidney injury)  ([place]) [N17.9] Fall, initial encounter [W19.XXXA] Facial trauma, initial encounter [F62.13YQ] Avulsion of skin of face, initial encounter [S01.80XA] Patient Active Problem List   Diagnosis Date Noted   Acute blood loss anemia superimposed on anemia of chronic disease 02/20/2022   Acute toxic and metabolic encephalopathy 65/78/4696   Thrombocytopenia ([place]) 02/20/2022   Hypokalemia 02/20/2022   Rhabdomyolysis 02/20/2022   Lactic acidosis 02/20/2022   Urinary tract infection 02/20/2022   Clostridioides difficile infection 02/18/2022   Facial trauma due to fall at home 02/17/2022   AKI (acute kidney injury) ([place]) 02/17/2022   Hypovolemic shock ([place])    Cardiomyopathy ([place]) 11/14/2020   Diabetic nephropathy ([place]) 11/14/2020   Impaired mobility and activities of daily living 06/10/2020   Alcohol withdrawal syndrome ([place]) 06/09/2020   Chronic alcohol abuse 06/09/2020   Facet arthropathy, lumbar 06/09/2020   Sepsis ([place]) 06/09/2020   Sinus tachycardia by electrocardiogram 06/09/2020   Spondylolisthesis of lumbosacral region 06/09/2020   Altered mental status 03/12/2020   Stage 3b chronic kidney disease ([place]) 10/24/2019   Urine leukocytes 09/24/2019   Other microscopic hematuria 09/24/2019   Postmenopause 02/08/2018   Screening for colorectal cancer 02/08/2018   Well  woman exam with routine gynecological exam 02/08/2018   Anemia in chronic kidney disease 12/15/2016   Essential hypertension, benign 09/12/2015   Cervical high risk HPV (human papillomavirus) test positive 01/30/2015   Biventricular ICD (implantable cardioverter-defibrillator) in place 81/85/6314   Chronic systolic heart failure ([place]) 12/18/2013   Preop cardiovascular exam 11/17/2011   Colon cancer screening 02/04/2011   Uncontrolled IDDM-2 with hyperglycemia and CKD-3B 12/31/2009   Mixed hyperlipidemia 12/31/2009   ANXIETY DEPRESSION 12/31/2009   GASTROESOPHAGEAL REFLUX DISEASE, CHRONIC 12/31/2009   CONSTIPATION,  CHRONIC 12/31/2009   Chronic lower back pain 12/31/2009   Cerebral vascular accident ([place]) 05/22/2009   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 [place] [address] Phone: [phone] Fax: [phone]  [facility] ([place], [place] [facility] [address] [place] [address] Phone: [phone] Fax: [place] [id number] - [place], [address] [address] Phone: [phone] Fax: [phone]  [place], [place] [address] Phone: [phone] Fax: [phone]     Social Determinants of Health (SDOH) Interventions    Readmission Risk Interventions     No data to display

## 2022-02-20 NOTE — Assessment & Plan Note (Signed)
TTE with LVEF of 55 to 60%, G1 DD, no WMA.  No cardiopulmonary symptoms.  Appears euvolemic on exam.  On p.o. Lasix 40 mg daily at home. -Continue holding diuretics in the setting of AKI -Monitor intake and output, daily weight, renal functions and electrolytes -Resume home Coreg if able to take p.o.  Otherwise, IV metoprolol 5 mg every 6 hours -Continue holding losartan

## 2022-02-20 NOTE — Assessment & Plan Note (Signed)
This is likely due to alcohol withdrawal and benzodiazepines.  She had elevated CIWA last night.  She received significant dose of IV Ativan earlier this morning.  She is somewhat sleepy but wakes to voice.  No focal neurodeficit.  CT head without acute finding.  Ammonia within normal.  Azotemia improved. -Reorientation and delirium precaution -Trial of high-dose thiamine

## 2022-02-20 NOTE — Assessment & Plan Note (Signed)
Continue Tylenol and oxycodone as needed

## 2022-02-20 NOTE — Assessment & Plan Note (Signed)
Likely due to alcohol abuse.  Improving.

## 2022-02-20 NOTE — Assessment & Plan Note (Signed)
Reportedly, she was a heavy drinker.  CIWA score ranges from 9-16.  -Continue CIWA with as needed Ativan -Trial of high-dose thiamine -Continue multivitamin and folic acid -Low threshold to transfer to ICU for Precedex

## 2022-02-20 NOTE — Assessment & Plan Note (Addendum)
Recent Labs    02/17/22 0756 02/17/22 0808 02/17/22 1524 02/18/22 0031 02/19/22 0355 02/20/22 1125  HGB 10.8* 11.2* 9.5* 8.9* 9.5* 9.0*  H&H stable after initial drop.  It seems she bled from the facial laceration before arrival.  -Monitor H&H. -Check anemia panel

## 2022-02-20 NOTE — Progress Notes (Signed)
Occupational Therapy Treatment Patient Details Name: [name] MRN: [id number] DOB: 25-Mar-1951 Today's Date: 02/20/2022   History of present illness 71 y.o. female presents to [facility] hospital on 02/17/2022 s/p fall, striking head on dresser. Pt underwent laceration repair on 6/6. 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   OT comments  Pt. Seen for skilled OT session.  Pt. With notable confusion and inability to sequence or follow commands consistently for completion of grooming tasks.  Max a for brushing teeth and washing face with set up.  Pending progress and cognition.  D/c plan may need to be updated if pt. Cont. To require this level of assistance for safety during mobility and adls.     Recommendations for follow up therapy are one component of a multi-disciplinary discharge planning process, led by the attending physician.  Recommendations may be updated based on patient status, additional functional criteria and insurance authorization.    Follow Up Recommendations       Assistance Recommended at Discharge    Patient can return home with the following      Equipment Recommendations       Recommendations for Other Services      Precautions / Restrictions Precautions Precautions: Fall       Mobility Bed Mobility Overal bed mobility: Needs Assistance Bed Mobility: Rolling, Sidelying to Sit Rolling: Min guard Sidelying to sit: Min assist       General bed mobility comments: cues for hand placement on bed rails cues for sequencing to roll, sidelying to sit. max encouragment cont. to say "i cant i cant"    Transfers                         Balance                                           ADL either performed or assessed with clinical judgement   ADL Overall ADL's : Needs assistance/impaired     Grooming: Wash/dry face;Oral care;Maximal  assistance;Cueing for sequencing;Sitting                                 General ADL Comments: able to sit eob un supported. required max a for grooming tasks. unable to initiate or sequence for oral care with items placed in front of her    Extremity/Trunk Assessment              Vision       Perception     Praxis      Cognition Arousal/Alertness: Awake/alert Behavior During Therapy: Impulsive Overall Cognitive Status: Impaired/Different from baseline Area of Impairment: Attention, Problem solving, Following commands, Safety/judgement, Awareness, Memory                       Following Commands: Follows one step commands inconsistently, Follows one step commands with increased time Safety/Judgement: Decreased awareness of deficits, Decreased awareness of safety     General Comments: could not state the year, able to state where she was, able to state why she was in the hospital. unable to sequence with familiar tasks. assistance and cues required        Exercises      Shoulder  Instructions       General Comments      Pertinent Vitals/ Pain       Pain Assessment Faces Pain Scale: Hurts a little bit Pain Location: face Pain Descriptors / Indicators: Discomfort Pain Intervention(s): Limited activity within patient's tolerance  Home Living                                          Prior Functioning/Environment              Frequency  Min 2X/week        Progress Toward Goals  OT Goals(current goals can now be found in the care plan section)  Progress towards OT goals: Progressing toward goals     Plan Discharge plan remains appropriate;Discharge plan needs to be updated    Co-evaluation                 AM-PAC OT "6 Clicks" Daily Activity     Outcome Measure                    End of Session    OT Visit Diagnosis: Unsteadiness on feet (R26.81);History of falling (Z91.81)   Activity  Tolerance Patient tolerated treatment well   Patient Left in bed;with call bell/phone within reach;with bed alarm set   Nurse Communication Other (comment) (rn states ok to work with pt. for therapy)        Time: 8242-3536 OT Time Calculation (min): 11 min  Charges: OT General Charges $OT Visit: 1 Visit OT Treatments $Self Care/Home Management : 8-22 mins  Sonia Baller, COTA/L Acute Rehabilitation [phone]   Tanya Nones 02/20/2022, 11:12 AM

## 2022-02-20 NOTE — Assessment & Plan Note (Addendum)
C. difficile toxin is negative but antigen and PCR positive.  Per RN, she is having constant liquid stool leakage.  No significant abdominal tenderness, fever or leukocytosis.  -Discontinue ceftriaxone.  She should have enough for UTI -Start p.o. vancomycin for 10 days.

## 2022-02-20 NOTE — Progress Notes (Addendum)
PROGRESS NOTE  [name] HFW:[id number] DOB: [birth year]   PCP: Celene Squibb, MD  Patient is from: Home  DOA: 02/17/2022 LOS: 3  Chief complaints Chief Complaint  Patient presents with   Fall     Brief Narrative / Interim history: 71 year old F with PMH of EtOH abuse, combined CHF s/p BIV PPM, CVA, DM-2, CKD-3B, neurogenic bladder with self-catheterization, chronic pain, GERD, anxiety, insomnia and depression presenting to [facility] after a fall at home the night of 6/5-6 hitting her head against a dresser while wearing her glasses.  Denies LOC.  Reportedly, "bled everywhere and cut the flap of skin off at home".  In ED, she was hypotensive to 74/44. Cr 5.6.  BUN 67.  Lactate 2.1.  Head CT negative for acute finding.  She was started on regular IV fluid and vasopressors and admitted to ICU for hypovolemic shock, AKI and acute blood loss anemia.  Patient had laceration repair by plastic surgery.  She came off vasopressors.  She is transferred to hospitalist service on 6/9.  She is currently undergoing alcohol withdrawal.    Subjective: Seen and examined earlier this morning.  Patient does disoriented and agitated since yesterday evening.  CIWA score has been elevated up to 16's.  She was given significant dose of IV Ativan early in the morning.  She is somewhat sleepy when I saw her late in the morning but briefly wakes to voice.  Does not seem to be in distress.  No apparent focal neurodeficit.  Objective: Vitals:   02/20/22 0529 02/20/22 0722 02/20/22 1141 02/20/22 1211  BP: (!) 144/71 138/70 (!) 155/84   Pulse: (!) 106 (!) 113 (!) 102 (!) 113  Resp: (!) 22 (!) 22 19   Temp: 98.2 F (36.8 C) 98.8 F (37.1 C) 98.8 F (37.1 C)   TempSrc: Oral Oral Oral   SpO2: 91% 94% 95%   Weight:      Height:        Examination:  GENERAL: No apparent distress.  Nontoxic. HEENT: MMM.  Vision and hearing grossly intact.  Dressing over left face DCI. NECK: Supple.  No apparent JVD.  RESP:   No IWOB.  Fair aeration bilaterally. CVS:  RRR. Heart sounds normal.  ABD/GI/GU: BS+. Abd soft, NTND.  MSK/EXT:  Moves extremities. No apparent deformity. No edema.  SKIN: no apparent skin lesion or wound NEURO: Sleepy but briefly wakes to voice.  No apparent focal neuro deficit. PSYCH: Calm. Normal affect.   Procedures:  6/8-complex left facial laceration repair by plastic surgery  Microbiology summarized: COVID-19 and influenza PCR nonreactive. MRSA PCR screen negative. Blood and urine cultures NGTD GIP negative. C. difficile PCR screen positive.  Assessment and Plan: * Facial trauma due to fall at home In the setting of fall.  CT head without acute finding. -S/p complex laceration repair on 6/8 by plastic surgery. -Bactroban to cheek daily, Erythromycin oph oint to left eye daily, KY gel to lateral nose daily and outpatient follow-up in 10 days -Fall precaution -PT/OT eval  Hypovolemic shock ([place]) Likely hypovolemic in the setting of dehydration, alcohol abuse and acute blood loss.  Resolved.  TTE with LVEF of 55 to 60%, G1 DD, normal RV/PASP and no WMA.  -Start gentle IV fluid due to poor p.o. intake in the setting of encephalopathy and alcohol withdrawal.  Acute toxic and metabolic encephalopathy This is likely due to alcohol withdrawal and benzodiazepines.  She had elevated CIWA last night.  She received significant dose of  IV Ativan earlier this morning.  She is somewhat sleepy but wakes to voice.  No focal neurodeficit.  CT head without acute finding.  Ammonia within normal.  Azotemia improved. -Reorientation and delirium precaution -Trial of high-dose thiamine  AKI (acute kidney injury) ([place]) Recent Labs    02/17/22 0756 02/17/22 0808 02/18/22 0031 02/19/22 0355 02/20/22 1125  BUN 67* 66* 58* 47* 27*  CREATININE 5.58* 6.40* 4.88* 3.44* 2.44*  Baseline Cr 1.3-1.5.  Improving. -Continue IV fluid -Avoid nephrotoxins.   Alcohol withdrawal syndrome  ([place]) Reportedly, she was a heavy drinker.  CIWA score ranges from 9-16.  -Continue CIWA with as needed Ativan -Trial of high-dose thiamine -Continue multivitamin and folic acid -Low threshold to transfer to ICU for Precedex  Acute blood loss anemia superimposed on anemia of chronic disease Recent Labs    02/17/22 0756 02/17/22 0808 02/17/22 1524 02/18/22 0031 02/19/22 0355 02/20/22 1125  HGB 10.8* 11.2* 9.5* 8.9* 9.5* 9.0*  H&H stable after initial drop.  It seems she bled from the facial laceration before arrival.  -Monitor H&H. -Check anemia panel   Clostridioides difficile infection C. difficile toxin is negative but antigen and PCR positive.  Per RN, she is having constant liquid stool leakage.  No significant abdominal tenderness, fever or leukocytosis.  -Discontinue ceftriaxone.  She should have enough for UTI -Start p.o. vancomycin for 10 days.  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  No cardiopulmonary symptoms.  Appears euvolemic on exam.  On p.o. Lasix 40 mg daily at home. -Continue holding diuretics in the setting of AKI -Monitor intake and output, daily weight, renal functions and electrolytes -Resume home Coreg if able to take p.o.  Otherwise, IV metoprolol 5 mg every 6 hours -Continue holding losartan  Uncontrolled IDDM-2 with hyperglycemia and CKD-3B A1c 7.3%. Recent Labs  Lab 02/19/22 1115 02/19/22 1558 02/19/22 2129 02/20/22 0851 02/20/22 1139  GLUCAP 172* 115* 162* 137* 131*  -Continue Semglee 10 units daily -Continue SSI-sensitive   Urinary tract infection Patient with neurogenic bladder and intermittent self-catheterization.  She is at risk for UTI.  UA with bacteria and pyuria.  Surprisingly urine culture is negative.  -Discontinue ceftriaxone given C. difficile.  Seems she already had 4 doses  Lactic acidosis Resolved.  Rhabdomyolysis Resolved.  Hypokalemia IV KCl 10 mill  equivalent x4 IV LR with KCl  Thrombocytopenia ([facility]) Likely due to alcohol abuse.  Improving.  Chronic alcohol abuse See alcohol withdrawal syndrome.  Essential hypertension, benign BP within acceptable range for most part. -Resume home Coreg  Chronic lower back pain Continue Tylenol and oxycodone as needed  Cerebral vascular accident ([place]) No focal neuro symptoms but limited exam due to mental status.  CT head without acute finding. -Continue home medications when able to take p.o.  ANXIETY DEPRESSION Now on IV Ativan with CIWA. -Resume home medications when able to take p.o.    DVT prophylaxis:  heparin injection 5,000 Units Start: 02/18/22 2200 SCDs Start: 02/17/22 1249 Place TED hose Start: 02/17/22 1249  Code Status: Full code Family Communication: Patient and/or RN. Available if any question.  Level of care: Progressive Status is: Inpatient Remains inpatient appropriate because: Due to alcohol withdrawal syndrome, encephalopathy, AKI   Final disposition: TBD Consultants:  Pulmonology admitted patient. Plastic surgery  Sch Meds:  Scheduled Meds:  buPROPion  300 mg Oral Daily   carvedilol  6.25 mg Oral BID WC   Chlorhexidine Gluconate  Cloth  6 each Topical Daily   folic acid  1 mg Oral Daily   heparin  5,000 Units Subcutaneous Q8H   insulin aspart  0-5 Units Subcutaneous QHS   insulin aspart  0-9 Units Subcutaneous TID WC   insulin glargine-yfgn  10 Units Subcutaneous Daily   multivitamin with minerals  1 tablet Oral Daily   pantoprazole  40 mg Oral Daily   sertraline  200 mg Oral Daily   [START ON 02/23/2022] thiamine  100 mg Oral Daily   vancomycin  125 mg Oral QID   Continuous Infusions:  sodium chloride Stopped (02/17/22 1857)   lactated ringers 1,000 mL with potassium chloride 30 mEq infusion     potassium chloride     thiamine injection     Followed by   Derrill Memo ON 02/21/2022] thiamine injection     PRN Meds:.acetaminophen **OR**  acetaminophen, bisacodyl, LORazepam **OR** LORazepam, ondansetron **OR** ondansetron (ZOFRAN) IV, oxyCODONE, polyethylene glycol  Antimicrobials: Anti-infectives (From admission, onward)    Start     Dose/Rate Route Frequency Ordered Stop   02/20/22 1415  vancomycin (VANCOCIN) capsule 125 mg        125 mg Oral 4 times daily 02/20/22 1321 03/02/22 1359   02/18/22 0600  ceFAZolin (ANCEF) IVPB 2g/100 mL premix  Status:  Discontinued        2 g 200 mL/hr over 30 Minutes Intravenous On call to O.R. 02/17/22 1835 02/17/22 1955   02/17/22 1830  cefTRIAXone (ROCEPHIN) 2 g in sodium chloride 0.9 % 100 mL IVPB  Status:  Discontinued        2 g 200 mL/hr over 30 Minutes Intravenous Every 24 hours 02/17/22 1828 02/20/22 1316   02/17/22 1708  ceFAZolin 1 g / gentamicin 80 mg in NS 500 mL surgical irrigation  Status:  Discontinued          As needed 02/17/22 1709 02/17/22 1824   02/17/22 0800  ceFAZolin (ANCEF) IVPB 2g/100 mL premix        2 g 200 mL/hr over 30 Minutes Intravenous  Once 02/17/22 0757 02/17/22 0851        I have personally reviewed the following labs and images: CBC: Recent Labs  Lab 02/17/22 0756 02/17/22 0808 02/17/22 1524 02/18/22 0031 02/19/22 0355 02/20/22 1125  WBC 13.3*  --  10.5 9.3 5.8 4.7  HGB 10.8* 11.2* 9.5* 8.9* 9.5* 9.0*  HCT 32.8* 33.0* 29.4* 28.5* 29.7* 27.3*  MCV 95.3  --  97.7 99.3 96.1 94.8  PLT 172  --  133* 123* 130* 141*   BMP &GFR Recent Labs  Lab 02/17/22 0756 02/17/22 0808 02/18/22 0031 02/19/22 0355 02/20/22 1125  NA 132* 132* 136 141 142  K 4.3 4.3 4.3 3.1* 3.1*  CL 99 99 110 108 109  CO2 18*  --  13* 18* 22  GLUCOSE 256* 245* 239* 183* 130*  BUN 67* 66* 58* 47* 27*  CREATININE 5.58* 6.40* 4.88* 3.44* 2.44*  CALCIUM 8.8*  --  7.7* 8.2* 8.6*  MG  --   --   --   --  1.8  PHOS  --   --   --   --  2.7   Estimated Creatinine Clearance: 19.2 mL/min (A) (by C-G formula based on SCr of 2.44 mg/dL (H)). Liver & Pancreas: Recent Labs   Lab 02/17/22 0756 02/18/22 0031 02/19/22 0355 02/20/22 1125  AST 162* 291* 95* 57*  ALT 68* 81* 29 24  ALKPHOS 111 207* 165* 134*  BILITOT 1.1 1.0 0.5 0.5  PROT 7.3 6.0* 6.3* 6.0*  ALBUMIN 3.7 3.0* 2.9* 2.6*   No results for input(s): "LIPASE", "AMYLASE" in the last 168 hours. Recent Labs  Lab 02/20/22 1125  AMMONIA 28   Diabetic: Recent Labs    02/17/22 1524  HGBA1C 7.3*   Recent Labs  Lab 02/19/22 1115 02/19/22 1558 02/19/22 2129 02/20/22 0851 02/20/22 1139  GLUCAP 172* 115* 162* 137* 131*   Cardiac Enzymes: Recent Labs  Lab 02/18/22 0031 02/20/22 1125  CKTOTAL 1,523* 163   No results for input(s): "PROBNP" in the last 8760 hours. Coagulation Profile: Recent Labs  Lab 02/17/22 0756 02/18/22 0031  INR 1.1 1.2   Thyroid Function Tests: No results for input(s): "TSH", "T4TOTAL", "FREET4", "T3FREE", "THYROIDAB" in the last 72 hours. Lipid Profile: No results for input(s): "CHOL", "HDL", "LDLCALC", "TRIG", "CHOLHDL", "LDLDIRECT" in the last 72 hours. Anemia Panel: No results for input(s): "VITAMINB12", "FOLATE", "FERRITIN", "TIBC", "IRON", "RETICCTPCT" in the last 72 hours. Urine analysis:    Component Value Date/Time   COLORURINE YELLOW 02/17/2022 0756   APPEARANCEUR HAZY (A) 02/17/2022 0756   APPEARANCEUR Clear 02/05/2016 1300   LABSPEC 1.012 02/17/2022 0756   PHURINE 5.0 02/17/2022 0756   GLUCOSEU NEGATIVE 02/17/2022 0756   HGBUR MODERATE (A) 02/17/2022 0756   BILIRUBINUR NEGATIVE 02/17/2022 0756   BILIRUBINUR negative 09/20/2019 1545   BILIRUBINUR Negative 02/05/2016 1300   KETONESUR NEGATIVE 02/17/2022 0756   PROTEINUR 100 (A) 02/17/2022 0756   UROBILINOGEN 0.2 09/20/2019 1545   UROBILINOGEN 0.2 08/13/2014 1332   NITRITE NEGATIVE 02/17/2022 0756   LEUKOCYTESUR LARGE (A) 02/17/2022 0756   Sepsis Labs: Invalid input(s): "PROCALCITONIN", "LACTICIDVEN"  Microbiology: Recent Results (from the past 240 hour(s))  Resp Panel by RT-PCR (Flu  A&B, Covid) Anterior Nasal Swab     Status: None   Collection Time: 02/17/22  7:56 AM   Specimen: Anterior Nasal Swab  Result Value Ref Range Status   SARS Coronavirus 2 by RT PCR NEGATIVE NEGATIVE Final    Comment: (NOTE) SARS-CoV-2 target nucleic acids are NOT DETECTED.  The SARS-CoV-2 RNA is generally detectable in upper respiratory specimens during the acute phase of infection. The lowest concentration of SARS-CoV-2 viral copies this assay can detect is 138 copies/mL. A negative result does not preclude SARS-Cov-2 infection and should not be used as the sole basis for treatment or other patient management decisions. A negative result may occur with  improper specimen collection/handling, submission of specimen other than nasopharyngeal swab, presence of viral mutation(s) within the areas targeted by this assay, and inadequate number of viral copies(<138 copies/mL). A negative result must be combined with clinical observations, patient history, and epidemiological information. The expected result is Negative.  Fact Sheet for Patients:  EntrepreneurPulse.com.au  Fact Sheet for Healthcare Providers:  IncredibleEmployment.be  This test is no t yet approved or cleared by the [place] FDA and  has been authorized for detection and/or diagnosis of SARS-CoV-2 by FDA under an Emergency Use Authorization (EUA). This EUA will remain  in effect (meaning this test can be used) for the duration of the COVID-19 declaration under Section 564(b)(1) of the Act, 21 U.S.C.section 360bbb-3(b)(1), unless the authorization is terminated  or revoked sooner.       Influenza A by PCR NEGATIVE NEGATIVE Final   Influenza B by PCR NEGATIVE NEGATIVE Final    Comment: (NOTE) The Xpert Xpress SARS-CoV-2/FLU/RSV plus assay is intended as an aid in the diagnosis of influenza from Nasopharyngeal swab specimens and  should not be used as a sole basis for treatment.  Nasal washings and aspirates are unacceptable for Xpert Xpress SARS-CoV-2/FLU/RSV testing.  Fact Sheet for Patients: EntrepreneurPulse.com.au  Fact Sheet for Healthcare Providers: IncredibleEmployment.be  This test is not yet approved or cleared by the [place] FDA and has been authorized for detection and/or diagnosis of SARS-CoV-2 by FDA under an Emergency Use Authorization (EUA). This EUA will remain in effect (meaning this test can be used) for the duration of the COVID-19 declaration under Section 564(b)(1) of the Act, 21 U.S.C. section 360bbb-3(b)(1), unless the authorization is terminated or revoked.  Performed at [facility], [address], [place], [place] [address]   Surgical pcr screen     Status: None   Collection Time: 02/17/22  4:07 PM   Specimen: Nasal Mucosa; Nasal Swab  Result Value Ref Range Status   MRSA, PCR NEGATIVE NEGATIVE Final   Staphylococcus aureus NEGATIVE NEGATIVE Final    Comment: (NOTE) The Xpert SA Assay (FDA approved for NASAL specimens in patients 24 years of age and older), is one component of a comprehensive surveillance program. It is not intended to diagnose infection nor to guide or monitor treatment. Performed at [facility], [place] [address], [place], [place] [address]   Gastrointestinal Panel by PCR , Stool     Status: None   Collection Time: 02/17/22  6:50 PM   Specimen: Stool  Result Value Ref Range Status   Campylobacter species NOT DETECTED NOT DETECTED Final   Plesimonas shigelloides NOT DETECTED NOT DETECTED Final   Salmonella species NOT DETECTED NOT DETECTED Final   Yersinia enterocolitica NOT DETECTED NOT DETECTED Final   Vibrio species NOT DETECTED NOT DETECTED Final   Vibrio cholerae NOT DETECTED NOT DETECTED Final   Enteroaggregative E coli (EAEC) NOT DETECTED NOT DETECTED Final   Enteropathogenic E coli (EPEC) NOT DETECTED NOT DETECTED Final   Enterotoxigenic E coli  (ETEC) NOT DETECTED NOT DETECTED Final   Shiga like toxin producing E coli (STEC) NOT DETECTED NOT DETECTED Final   Shigella/Enteroinvasive E coli (EIEC) NOT DETECTED NOT DETECTED Final   Cryptosporidium NOT DETECTED NOT DETECTED Final   Cyclospora cayetanensis NOT DETECTED NOT DETECTED Final   Entamoeba histolytica NOT DETECTED NOT DETECTED Final   Giardia lamblia NOT DETECTED NOT DETECTED Final   Adenovirus F40/41 NOT DETECTED NOT DETECTED Final   Astrovirus NOT DETECTED NOT DETECTED Final   Norovirus GI/GII NOT DETECTED NOT DETECTED Final   Rotavirus A NOT DETECTED NOT DETECTED Final   Sapovirus (I, II, IV, and V) NOT DETECTED NOT DETECTED Final    Comment: Performed at [facility], [address] [place], [place] [address]  Urine Culture     Status: None   Collection Time: 02/17/22 10:44 PM   Specimen: Urine, Catheterized  Result Value Ref Range Status   Specimen Description URINE, CATHETERIZED  Final   Special Requests NONE  Final   Culture   Final    NO GROWTH Performed at [facility], [address], [place], [place] [address]    Report Status 02/18/2022 FINAL  Final  Culture, blood (Routine X 2) w Reflex to ID Panel     Status: None (Preliminary result)   Collection Time: 02/18/22 12:30 AM   Specimen: BLOOD RIGHT HAND  Result Value Ref Range Status   Specimen Description BLOOD RIGHT HAND  Final   Special Requests   Final    BOTTLES DRAWN AEROBIC AND ANAEROBIC Blood Culture  results may not be optimal due to an inadequate volume of blood received in culture bottles   Culture   Final    NO GROWTH 1 DAY Performed at [facility], [place] [address], [place], [place] [address]    Report Status PENDING  Incomplete  Culture, blood (Routine X 2) w Reflex to ID Panel     Status: None (Preliminary result)   Collection Time: 02/18/22 12:54 AM   Specimen: BLOOD LEFT HAND  Result Value Ref Range Status   Specimen Description BLOOD LEFT HAND  Final   Special  Requests   Final    BOTTLES DRAWN AEROBIC AND ANAEROBIC Blood Culture results may not be optimal due to an inadequate volume of blood received in culture bottles   Culture   Final    NO GROWTH 1 DAY Performed at [facility], [place] [address] [place], [place] [address]    Report Status PENDING  Incomplete  C Difficile Quick Screen w PCR reflex     Status: Abnormal   Collection Time: 02/18/22 11:30 AM   Specimen: STOOL  Result Value Ref Range Status   C Diff antigen POSITIVE (A) NEGATIVE Final   C Diff toxin NEGATIVE NEGATIVE Final   C Diff interpretation Results are indeterminate. See PCR results.  Final    Comment: Performed at [facility], [place] [address] [place], [place] [address]  C. Diff by PCR, Reflexed     Status: Abnormal   Collection Time: 02/18/22 11:30 AM  Result Value Ref Range Status   Toxigenic C. Difficile by PCR POSITIVE (A) NEGATIVE Final    Comment: Positive for toxigenic C. difficile with little to no toxin production. Only treat if clinical presentation suggests symptomatic illness. CRITICAL RESULT CALLED TO, READ BACK BY AND VERIFIED WITH: RN Fabio Neighbors [phone] '@[id number]'$  FH Performed at [facility], [address], [place],  [address]     Radiology Studies: CUP PACEART REMOTE DEVICE CHECK  Result Date: 02/20/2022 Scheduled remote reviewed. Normal device function.  1 NSVT 13 beats 16 fast AV, longest SVT 61mn 22sec, HR's 150's Next remote 91 days. [place]     Gabrien Mentink T. [place] If 7PM-7AM, please contact night-coverage [email or web address] 02/20/2022, 1:23 PM

## 2022-02-21 DIAGNOSIS — I9589 Other hypotension: Secondary | ICD-10-CM | POA: Diagnosis not present

## 2022-02-21 DIAGNOSIS — R571 Hypovolemic shock: Secondary | ICD-10-CM | POA: Diagnosis not present

## 2022-02-21 DIAGNOSIS — G9341 Metabolic encephalopathy: Secondary | ICD-10-CM | POA: Diagnosis not present

## 2022-02-21 DIAGNOSIS — S0993XA Unspecified injury of face, initial encounter: Secondary | ICD-10-CM | POA: Diagnosis not present

## 2022-02-21 LAB — COMPREHENSIVE METABOLIC PANEL
ALT: 38 U/L (ref 0–44)
AST: 74 U/L — ABNORMAL HIGH (ref 15–41)
Albumin: 2.3 g/dL — ABNORMAL LOW (ref 3.5–5.0)
Alkaline Phosphatase: 115 U/L (ref 38–126)
Anion gap: 9 (ref 5–15)
BUN: 23 mg/dL (ref 8–23)
CO2: 21 mmol/L — ABNORMAL LOW (ref 22–32)
Calcium: 8.2 mg/dL — ABNORMAL LOW (ref 8.9–10.3)
Chloride: 110 mmol/L (ref 98–111)
Creatinine, Ser: 2.07 mg/dL — ABNORMAL HIGH (ref 0.44–1.00)
GFR, Estimated: 25 mL/min — ABNORMAL LOW (ref >60)
Glucose, Bld: 140 mg/dL — ABNORMAL HIGH (ref 70–99)
Potassium: 3.4 mmol/L — ABNORMAL LOW (ref 3.5–5.1)
Sodium: 140 mmol/L (ref 135–145)
Total Bilirubin: 0.5 mg/dL (ref 0.3–1.2)
Total Protein: 5.4 g/dL — ABNORMAL LOW (ref 6.5–8.1)

## 2022-02-21 LAB — GLUCOSE, CAPILLARY
Glucose-Capillary: 144 mg/dL — ABNORMAL HIGH (ref 70–99)
Glucose-Capillary: 107 mg/dL — ABNORMAL HIGH (ref 70–99)
Glucose-Capillary: 102 mg/dL — ABNORMAL HIGH (ref 70–99)
Glucose-Capillary: 104 mg/dL — ABNORMAL HIGH (ref 70–99)

## 2022-02-21 LAB — CBC
HCT: 25.4 % — ABNORMAL LOW (ref 36.0–46.0)
Hemoglobin: 8.7 g/dL — ABNORMAL LOW (ref 12.0–15.0)
MCH: 31.9 pg (ref 26.0–34.0)
MCHC: 34.3 g/dL (ref 30.0–36.0)
MCV: 93 fL (ref 80.0–100.0)
Platelets: 135 10*3/uL — ABNORMAL LOW (ref 150–400)
RBC: 2.73 MIL/uL — ABNORMAL LOW (ref 3.87–5.11)
RDW: 14.1 % (ref 11.5–15.5)
WBC: 4.6 10*3/uL (ref 4.0–10.5)
nRBC: 0 % (ref 0.0–0.2)

## 2022-02-21 LAB — IRON AND TIBC
Iron: 27 ug/dL — ABNORMAL LOW (ref 28–170)
Saturation Ratios: 13 % (ref 10.4–31.8)
TIBC: 211 ug/dL — ABNORMAL LOW (ref 250–450)
UIBC: 184 ug/dL

## 2022-02-21 LAB — RETICULOCYTES
Immature Retic Fract: 31 % — ABNORMAL HIGH (ref 2.3–15.9)
RBC.: 2.69 MIL/uL — ABNORMAL LOW (ref 3.87–5.11)
Retic Count, Absolute: 37.9 10*3/uL (ref 19.0–186.0)
Retic Ct Pct: 1.4 % (ref 0.4–3.1)

## 2022-02-21 LAB — MAGNESIUM: Magnesium: 1.8 mg/dL (ref 1.7–2.4)

## 2022-02-21 LAB — PHOSPHORUS: Phosphorus: 2.3 mg/dL — ABNORMAL LOW (ref 2.5–4.6)

## 2022-02-21 LAB — VITAMIN B12: Vitamin B-12: 1290 pg/mL — ABNORMAL HIGH (ref 180–914)

## 2022-02-21 LAB — FOLATE: Folate: 21.4 ng/mL (ref >5.9)

## 2022-02-21 LAB — CK: Total CK: 101 U/L (ref 38–234)

## 2022-02-21 LAB — FERRITIN: Ferritin: 92 ng/mL (ref 11–307)

## 2022-02-21 MED ORDER — PHENOBARBITAL SODIUM 130 MG/ML IJ SOLN
97.5000 mg | Freq: Three times a day (TID) | INTRAMUSCULAR | Status: AC
  Administered 2022-02-22 – 2022-02-23 (×5): 97.5 mg via INTRAVENOUS
  Filled 2022-02-21 (×5): qty 1

## 2022-02-21 MED ORDER — CHLORDIAZEPOXIDE HCL 25 MG PO CAPS
25.0000 mg | ORAL_CAPSULE | Freq: Three times a day (TID) | ORAL | Status: DC
  Administered 2022-02-21 – 2022-02-23 (×6): 25 mg via ORAL
  Filled 2022-02-21 (×5): qty 1

## 2022-02-21 MED ORDER — PHENOBARBITAL SODIUM 65 MG/ML IJ SOLN
65.0000 mg | Freq: Three times a day (TID) | INTRAMUSCULAR | Status: DC
Start: 2022-02-23 — End: 2022-02-21

## 2022-02-21 MED ORDER — LORAZEPAM 2 MG/ML IJ SOLN
2.0000 mg | INTRAMUSCULAR | Status: DC | PRN
  Administered 2022-02-21 – 2022-02-25 (×12): 2 mg via INTRAVENOUS
  Filled 2022-02-21 (×12): qty 1

## 2022-02-21 MED ORDER — PHENOBARBITAL SODIUM 65 MG/ML IJ SOLN
65.0000 mg | Freq: Three times a day (TID) | INTRAMUSCULAR | Status: DC
  Administered 2022-02-23 – 2022-02-24 (×4): 65 mg via INTRAVENOUS
  Filled 2022-02-21 (×4): qty 1

## 2022-02-21 MED ORDER — PHENOBARBITAL SODIUM 130 MG/ML IJ SOLN
130.0000 mg | Freq: Once | INTRAMUSCULAR | Status: AC
Start: 2022-02-21 — End: 2022-02-21
  Administered 2022-02-21: 130 mg via INTRAVENOUS
  Filled 2022-02-21: qty 1

## 2022-02-21 MED ORDER — LORAZEPAM 2 MG/ML IJ SOLN
1.0000 mg | INTRAMUSCULAR | Status: DC | PRN
  Administered 2022-02-21: 1 mg via INTRAVENOUS
  Filled 2022-02-21 (×2): qty 1

## 2022-02-21 MED ORDER — VALPROATE SODIUM 100 MG/ML IV SOLN
250.0000 mg | Freq: Two times a day (BID) | INTRAVENOUS | Status: DC
  Administered 2022-02-21 – 2022-02-25 (×8): 250 mg via INTRAVENOUS
  Filled 2022-02-21 (×9): qty 2.5

## 2022-02-21 MED ORDER — PHENOBARBITAL SODIUM 65 MG/ML IJ SOLN: 32.5000 mg | Freq: Three times a day (TID) | INTRAMUSCULAR | Status: DC

## 2022-02-21 MED ORDER — PHENOBARBITAL SODIUM 65 MG/ML IJ SOLN
65.0000 mg | Freq: Once | INTRAMUSCULAR | Status: AC
  Administered 2022-02-21: 65 mg via INTRAVENOUS

## 2022-02-21 MED ORDER — PHENOBARBITAL SODIUM 65 MG/ML IJ SOLN
65.0000 mg | Freq: Once | INTRAMUSCULAR | Status: AC
  Administered 2022-02-21: 65 mg via INTRAVENOUS
  Filled 2022-02-21: qty 1

## 2022-02-21 MED ORDER — PHENOBARBITAL SODIUM 65 MG/ML IJ SOLN
65.0000 mg | Freq: Once | INTRAMUSCULAR | Status: AC
Start: 2022-02-21 — End: 2022-02-21
  Administered 2022-02-21: 65 mg via INTRAVENOUS
  Filled 2022-02-21: qty 1

## 2022-02-21 MED ORDER — PHENOBARBITAL SODIUM 130 MG/ML IJ SOLN: 97.5000 mg | Freq: Three times a day (TID) | INTRAMUSCULAR | Status: DC

## 2022-02-21 MED ORDER — POTASSIUM CHLORIDE 10 MEQ/100ML IV SOLN
10.0000 meq | INTRAVENOUS | Status: AC
  Administered 2022-02-21 (×3): 10 meq via INTRAVENOUS
  Filled 2022-02-21 (×3): qty 100

## 2022-02-21 MED ORDER — PHENOBARBITAL SODIUM 65 MG/ML IJ SOLN
65.0000 mg | Freq: Once | INTRAMUSCULAR | Status: DC
  Filled 2022-02-21: qty 1

## 2022-02-21 MED ORDER — POTASSIUM CHLORIDE 2 MEQ/ML IV SOLN
INTRAVENOUS | Status: AC
  Filled 2022-02-21 (×3): qty 1000

## 2022-02-21 NOTE — Progress Notes (Signed)
Notified of ongoing severe agitation despite phenobarb and CIWA. Adding librium & valproate. D/w pharmD and RR RN.  Julian Hy, DO 02/21/22 8:35 PM  Pulmonary & Critical Care

## 2022-02-21 NOTE — Progress Notes (Signed)
PROGRESS NOTE  [name] EVO:[id number] DOB: March 03, 1951   PCP: Celene Squibb, MD  Patient is from: Home  DOA: 02/17/2022 LOS: 4  Chief complaints Chief Complaint  Patient presents with   Fall     Brief Narrative / Interim history: 71 year old F with PMH of EtOH abuse, combined CHF s/p BIV PPM, CVA, DM-2, CKD-3B, neurogenic bladder with self-catheterization, chronic pain, GERD, anxiety, insomnia and depression presenting to [facility] after a fall at home the night of 6/5-6 hitting her head against a dresser while wearing her glasses.  Denies LOC.  Reportedly, "bled everywhere and cut the flap of skin off at home".  In ED, she was hypotensive to 74/44. Cr 5.6.  BUN 67.  Lactate 2.1.  Head CT negative for acute finding.  She was started on regular IV fluid and vasopressors and admitted to ICU for hypovolemic shock, AKI and acute blood loss anemia.  Patient had laceration repair by plastic surgery.  She came off vasopressors.  She is transferred to hospitalist service on 6/9.  She is currently undergoing alcohol withdrawal.  On phenobarbital taper and as needed Ativan with CIWA.  Subjective: Seen and examined earlier this morning.  Patient was agitated and trying to get out of the bed earlier this morning despite significant doses of IV Ativan.  She was started on phenobarbital taper.  PCCM consulted and increased Ativan dosage.   Objective: Vitals:   02/21/22 1230 02/21/22 1300 02/21/22 1435 02/21/22 1520  BP: (!) 156/89 (!) 165/69 (!) 150/90 (!) 151/69  Pulse: 95 96 99   Resp:  (!) 23 (!) 21 (!) 21  Temp:      TempSrc:      SpO2: 97%  100%   Weight:      Height:        Examination:  GENERAL: No apparent distress.  Nontoxic. HEENT: MMM.  Hearing grossly intact.  Dressing over left face and left eye. NECK: Supple.  No apparent JVD.  RESP:  No IWOB.  Fair aeration bilaterally. CVS:  RRR. Heart sounds normal.  ABD/GI/GU: BS+. Abd soft, NTND.  MSK/EXT:  Moves extremities. No  apparent deformity. No edema.  SKIN: Dressing over left face and left eye NEURO: Awake.  Confused.  Restless.  No apparent focal neuro deficit. PSYCH: Confused.  Restless.  Procedures:  6/8-complex left facial laceration repair by plastic surgery  Microbiology summarized: COVID-19 and influenza PCR nonreactive. MRSA PCR screen negative. Blood and urine cultures NGTD GIP negative. C. difficile PCR screen positive.  Assessment and Plan: Facial trauma due to fall at home In the setting of fall.  CT head without acute finding. -S/p complex laceration repair on 6/8 by plastic surgery. -Bactroban to cheek daily, Erythromycin oph oint to left eye daily, KY gel to lateral nose daily and outpatient follow-up in 10 days -Fall precaution -PT/OT eval  Hypovolemic shock ([place]) Likely hypovolemic in the setting of dehydration, alcohol abuse and acute blood loss.  Resolved.  TTE with LVEF of 55 to 60%, G1 DD, normal RV/PASP and no WMA.  -IV fluid due to poor p.o. intake in the setting of encephalopathy and alcohol withdrawal.  Acute toxic and metabolic encephalopathy/alcohol withdrawal syndrome: Agitated and trying to climb out of the bed.  She received significant dose of IV Ativan earlier this morning.  CT head without acute finding.  Ammonia within normal.  Azotemia improved. -Continue high-dose thiamine -Started phenobarbital taper -Continue CIWA with as needed Ativan.  Ativan dose increased by PCCM -Appreciate  help by PCCM.  AKI on CKD-3A: Baseline Cr 1.3-1.5.  Improving. Recent Labs    02/17/22 0756 02/17/22 0808 02/18/22 0031 02/19/22 0355 02/20/22 1125 02/21/22 0315  BUN 67* 66* 58* 47* 27* 23  CREATININE 5.58* 6.40* 4.88* 3.44* 2.44* 2.07*  -Continue IV fluid -Avoid nephrotoxins.  ABLA superimposed on anemia of chronic disease: H&H stable after initial drop. Recent Labs    02/17/22 0756 02/17/22 0808 02/17/22 1524 02/18/22 0031 02/19/22 0355 02/20/22 1125  02/21/22 0315  HGB 10.8* 11.2* 9.5* 8.9* 9.5* 9.0* 8.7*  -Monitor H&H. -Check anemia panel  Clostridioides difficile infection: C. difficile toxin is negative but antigen and PCR positive.  Had some diarrhea.  She is on ceftriaxone. -Discontinued ceftriaxone.  She should have enough for UTI -Started p.o. vancomycin for 10 days on 6/9.  Chronic combined CHF s/p BIV PPM now with recovered EF: TTE with LVEF of 55 to 60%, G1 DD, no WMA.  No cardiopulmonary symptoms.  Appears euvolemic on exam.  On p.o. Lasix 40 mg daily at home. -Continue holding diuretics in the setting of AKI -Monitor intake and output, daily weight, renal functions and electrolytes -Resume home Coreg if able to take p.o.  Otherwise, IV metoprolol 5 mg every 6 hours -Continue holding losartan  Uncontrolled IDDM-2 with hyperglycemia and CKD-3B: A1c 7.3%. Recent Labs  Lab 02/20/22 1139 02/20/22 1720 02/20/22 2139 02/21/22 0738 02/21/22 1139  GLUCAP 131* 95 186* 144* 107*  -Continue Semglee 10 units daily -Continue SSI-sensitive  Urinary tract infection: Patient with neurogenic bladder and intermittent self-catheterization.  She is at risk for UTI.  UA with bacteria and pyuria.  Surprisingly urine culture is negative.  -Discontinued ceftriaxone given C. difficile.  Seems she already had 4 doses  Lactic acidosis: Resolved.  Rhabdomyolysis: Resolved.  Hypokalemia: K 3.4 Mg 1.8 -IV KCl 10 mill equivalent x4 -IV LR with KCl  Thrombocytopenia ([place]): Likely due to alcohol abuse.  stable  Essential hypertension, benign: BP slightly elevated BP within acceptable range for most part. -Resume home Coreg  Chronic lower back pain Continue Tylenol and oxycodone as needed  Cerebral vascular accident [facility]): No focal neuro symptoms but limited exam due to mental status.  CT head without acute finding. -Continue home medications when able to take p.o.  ANXIETY DEPRESSION -Now on IV Ativan with CIWA. -Resume home  medications when able to take p.o.   DVT prophylaxis:  heparin injection 5,000 Units Start: 02/18/22 2200 SCDs Start: 02/17/22 1249 Place TED hose Start: 02/17/22 1249  Code Status: Full code Family Communication: Patient and/or RN. Available if any question.  Level of care: Progressive Status is: Inpatient Remains inpatient appropriate because: Due to alcohol withdrawal syndrome, encephalopathy, AKI   Final disposition: TBD Consultants:  Pulmonology Plastic surgery   Sch Meds:  Scheduled Meds:  buPROPion  300 mg Oral Daily   carvedilol  6.25 mg Oral BID WC   Chlorhexidine Gluconate Cloth  6 each Topical Daily   folic acid  1 mg Oral Daily   heparin  5,000 Units Subcutaneous Q8H   insulin aspart  0-5 Units Subcutaneous QHS   insulin aspart  0-9 Units Subcutaneous TID WC   insulin glargine-yfgn  10 Units Subcutaneous Daily   multivitamin with minerals  1 tablet Oral Daily   pantoprazole  40 mg Oral Daily   PHENObarbital  97.5 mg Intravenous Q8H   Followed by   [START ON 02/23/2022] PHENObarbital  65 mg Intravenous Q8H   Followed by   [START ON 02/25/2022]  PHENObarbital  32.5 mg Intravenous Q8H   sertraline  200 mg Oral Daily   [START ON 02/23/2022] thiamine  100 mg Oral Daily   vancomycin  125 mg Oral QID   Continuous Infusions:  sodium chloride Stopped (02/17/22 1857)   thiamine injection 250 mg (02/21/22 1524)   PRN Meds:.acetaminophen **OR** acetaminophen, bisacodyl, LORazepam, ondansetron **OR** ondansetron (ZOFRAN) IV, oxyCODONE, polyethylene glycol  Antimicrobials: Anti-infectives (From admission, onward)    Start     Dose/Rate Route Frequency Ordered Stop   02/20/22 1415  vancomycin (VANCOCIN) capsule 125 mg        125 mg Oral 4 times daily 02/20/22 1321 03/02/22 1359   02/18/22 0600  ceFAZolin (ANCEF) IVPB 2g/100 mL premix  Status:  Discontinued        2 g 200 mL/hr over 30 Minutes Intravenous On call to O.R. 02/17/22 1835 02/17/22 1955   02/17/22 1830   cefTRIAXone (ROCEPHIN) 2 g in sodium chloride 0.9 % 100 mL IVPB  Status:  Discontinued        2 g 200 mL/hr over 30 Minutes Intravenous Every 24 hours 02/17/22 1828 02/20/22 1316   02/17/22 1708  ceFAZolin 1 g / gentamicin 80 mg in NS 500 mL surgical irrigation  Status:  Discontinued          As needed 02/17/22 1709 02/17/22 1824   02/17/22 0800  ceFAZolin (ANCEF) IVPB 2g/100 mL premix        2 g 200 mL/hr over 30 Minutes Intravenous  Once 02/17/22 0757 02/17/22 0851        I have personally reviewed the following labs and images: CBC: Recent Labs  Lab 02/17/22 1524 02/18/22 0031 02/19/22 0355 02/20/22 1125 02/21/22 0315  WBC 10.5 9.3 5.8 4.7 4.6  HGB 9.5* 8.9* 9.5* 9.0* 8.7*  HCT 29.4* 28.5* 29.7* 27.3* 25.4*  MCV 97.7 99.3 96.1 94.8 93.0  PLT 133* 123* 130* 141* 135*   BMP &GFR Recent Labs  Lab 02/17/22 0756 02/17/22 0808 02/18/22 0031 02/19/22 0355 02/20/22 1125 02/21/22 0315  NA 132* 132* 136 141 142 140  K 4.3 4.3 4.3 3.1* 3.1* 3.4*  CL 99 99 110 108 109 110  CO2 18*  --  13* 18* 22 21*  GLUCOSE 256* 245* 239* 183* 130* 140*  BUN 67* 66* 58* 47* 27* 23  CREATININE 5.58* 6.40* 4.88* 3.44* 2.44* 2.07*  CALCIUM 8.8*  --  7.7* 8.2* 8.6* 8.2*  MG  --   --   --   --  1.8 1.8  PHOS  --   --   --   --  2.7 2.3*   Estimated Creatinine Clearance: 22 mL/min (A) (by C-G formula based on SCr of 2.07 mg/dL (H)). Liver & Pancreas: Recent Labs  Lab 02/17/22 0756 02/18/22 0031 02/19/22 0355 02/20/22 1125 02/21/22 0315  AST 162* 291* 95* 57* 74*  ALT 68* 81* 29 24 38  ALKPHOS 111 207* 165* 134* 115  BILITOT 1.1 1.0 0.5 0.5 0.5  PROT 7.3 6.0* 6.3* 6.0* 5.4*  ALBUMIN 3.7 3.0* 2.9* 2.6* 2.3*   No results for input(s): "LIPASE", "AMYLASE" in the last 168 hours. Recent Labs  Lab 02/20/22 1125  AMMONIA 28   Diabetic: No results for input(s): "HGBA1C" in the last 72 hours.  Recent Labs  Lab 02/20/22 1139 02/20/22 1720 02/20/22 2139 02/21/22 0738  02/21/22 1139  GLUCAP 131* 95 186* 144* 107*   Cardiac Enzymes: Recent Labs  Lab 02/18/22 0031 02/20/22 1125 02/21/22 0315  CKTOTAL 1,523* 163 101   No results for input(s): "PROBNP" in the last 8760 hours. Coagulation Profile: Recent Labs  Lab 02/17/22 0756 02/18/22 0031  INR 1.1 1.2   Thyroid Function Tests: No results for input(s): "TSH", "T4TOTAL", "FREET4", "T3FREE", "THYROIDAB" in the last 72 hours. Lipid Profile: No results for input(s): "CHOL", "HDL", "LDLCALC", "TRIG", "CHOLHDL", "LDLDIRECT" in the last 72 hours. Anemia Panel: Recent Labs    02/21/22 0315  VITAMINB12 1,290*  FOLATE 21.4  FERRITIN 92  TIBC 211*  IRON 27*  RETICCTPCT 1.4   Urine analysis:    Component Value Date/Time   COLORURINE YELLOW 02/17/2022 0756   APPEARANCEUR HAZY (A) 02/17/2022 0756   APPEARANCEUR Clear 02/05/2016 1300   LABSPEC 1.012 02/17/2022 0756   PHURINE 5.0 02/17/2022 0756   GLUCOSEU NEGATIVE 02/17/2022 0756   HGBUR MODERATE (A) 02/17/2022 0756   BILIRUBINUR NEGATIVE 02/17/2022 0756   BILIRUBINUR negative 09/20/2019 1545   BILIRUBINUR Negative 02/05/2016 1300   KETONESUR NEGATIVE 02/17/2022 0756   PROTEINUR 100 (A) 02/17/2022 0756   UROBILINOGEN 0.2 09/20/2019 1545   UROBILINOGEN 0.2 08/13/2014 1332   NITRITE NEGATIVE 02/17/2022 0756   LEUKOCYTESUR LARGE (A) 02/17/2022 0756   Sepsis Labs: Invalid input(s): "PROCALCITONIN", "LACTICIDVEN"  Microbiology: Recent Results (from the past 240 hour(s))  Resp Panel by RT-PCR (Flu A&B, Covid) Anterior Nasal Swab     Status: None   Collection Time: 02/17/22  7:56 AM   Specimen: Anterior Nasal Swab  Result Value Ref Range Status   SARS Coronavirus 2 by RT PCR NEGATIVE NEGATIVE Final    Comment: (NOTE) SARS-CoV-2 target nucleic acids are NOT DETECTED.  The SARS-CoV-2 RNA is generally detectable in upper respiratory specimens during the acute phase of infection. The lowest concentration of SARS-CoV-2 viral copies this  assay can detect is 138 copies/mL. A negative result does not preclude SARS-Cov-2 infection and should not be used as the sole basis for treatment or other patient management decisions. A negative result may occur with  improper specimen collection/handling, submission of specimen other than nasopharyngeal swab, presence of viral mutation(s) within the areas targeted by this assay, and inadequate number of viral copies(<138 copies/mL). A negative result must be combined with clinical observations, patient history, and epidemiological information. The expected result is Negative.  Fact Sheet for Patients:  EntrepreneurPulse.com.au  Fact Sheet for Healthcare Providers:  IncredibleEmployment.be  This test is no t yet approved or cleared by the [place] FDA and  has been authorized for detection and/or diagnosis of SARS-CoV-2 by FDA under an Emergency Use Authorization (EUA). This EUA will remain  in effect (meaning this test can be used) for the duration of the COVID-19 declaration under Section 564(b)(1) of the Act, 21 U.S.C.section 360bbb-3(b)(1), unless the authorization is terminated  or revoked sooner.       Influenza A by PCR NEGATIVE NEGATIVE Final   Influenza B by PCR NEGATIVE NEGATIVE Final    Comment: (NOTE) The Xpert Xpress SARS-CoV-2/FLU/RSV plus assay is intended as an aid in the diagnosis of influenza from Nasopharyngeal swab specimens and should not be used as a sole basis for treatment. Nasal washings and aspirates are unacceptable for Xpert Xpress SARS-CoV-2/FLU/RSV testing.  Fact Sheet for Patients: EntrepreneurPulse.com.au  Fact Sheet for Healthcare Providers: IncredibleEmployment.be  This test is not yet approved or cleared by the [place] FDA and has been authorized for detection and/or diagnosis of SARS-CoV-2 by FDA under an Emergency Use Authorization (EUA). This EUA will  remain in effect (meaning this  test can be used) for the duration of the COVID-19 declaration under Section 564(b)(1) of the Act, 21 U.S.C. section 360bbb-3(b)(1), unless the authorization is terminated or revoked.  Performed at [facility], [address] [place], [place] [address]   Surgical pcr screen     Status: None   Collection Time: 02/17/22  4:07 PM   Specimen: Nasal Mucosa; Nasal Swab  Result Value Ref Range Status   MRSA, PCR NEGATIVE NEGATIVE Final   Staphylococcus aureus NEGATIVE NEGATIVE Final    Comment: (NOTE) The Xpert SA Assay (FDA approved for NASAL specimens in patients 75 years of age and older), is one component of a comprehensive surveillance program. It is not intended to diagnose infection nor to guide or monitor treatment. Performed at [facility], [place] [address], [place], [place] [address]   Gastrointestinal Panel by PCR , Stool     Status: None   Collection Time: 02/17/22  6:50 PM   Specimen: Stool  Result Value Ref Range Status   Campylobacter species NOT DETECTED NOT DETECTED Final   Plesimonas shigelloides NOT DETECTED NOT DETECTED Final   Salmonella species NOT DETECTED NOT DETECTED Final   Yersinia enterocolitica NOT DETECTED NOT DETECTED Final   Vibrio species NOT DETECTED NOT DETECTED Final   Vibrio cholerae NOT DETECTED NOT DETECTED Final   Enteroaggregative E coli (EAEC) NOT DETECTED NOT DETECTED Final   Enteropathogenic E coli (EPEC) NOT DETECTED NOT DETECTED Final   Enterotoxigenic E coli (ETEC) NOT DETECTED NOT DETECTED Final   Shiga like toxin producing E coli (STEC) NOT DETECTED NOT DETECTED Final   Shigella/Enteroinvasive E coli (EIEC) NOT DETECTED NOT DETECTED Final   Cryptosporidium NOT DETECTED NOT DETECTED Final   Cyclospora cayetanensis NOT DETECTED NOT DETECTED Final   Entamoeba histolytica NOT DETECTED NOT DETECTED Final   Giardia lamblia NOT DETECTED NOT DETECTED Final   Adenovirus F40/41 NOT DETECTED NOT DETECTED  Final   Astrovirus NOT DETECTED NOT DETECTED Final   Norovirus GI/GII NOT DETECTED NOT DETECTED Final   Rotavirus A NOT DETECTED NOT DETECTED Final   Sapovirus (I, II, IV, and V) NOT DETECTED NOT DETECTED Final    Comment: Performed at [facility], [address] [place], [place] [address]  Urine Culture     Status: None   Collection Time: 02/17/22 10:44 PM   Specimen: Urine, Catheterized  Result Value Ref Range Status   Specimen Description URINE, CATHETERIZED  Final   Special Requests NONE  Final   Culture   Final    NO GROWTH Performed at [facility], [address], [place],  [address]    Report Status 02/18/2022 FINAL  Final  Culture, blood (Routine X 2) w Reflex to ID Panel     Status: None (Preliminary result)   Collection Time: 02/18/22 12:30 AM   Specimen: BLOOD RIGHT HAND  Result Value Ref Range Status   Specimen Description BLOOD RIGHT HAND  Final   Special Requests   Final    BOTTLES DRAWN AEROBIC AND ANAEROBIC Blood Culture results may not be optimal due to an inadequate volume of blood received in culture bottles   Culture   Final    NO GROWTH 3 DAYS Performed at [facility], [place] [address], [place],  [address]    Report Status PENDING  Incomplete  Culture, blood (Routine X 2) w Reflex to ID Panel     Status: None (Preliminary result)   Collection Time: 02/18/22 12:54  AM   Specimen: BLOOD LEFT HAND  Result Value Ref Range Status   Specimen Description BLOOD LEFT HAND  Final   Special Requests   Final    BOTTLES DRAWN AEROBIC AND ANAEROBIC Blood Culture results may not be optimal due to an inadequate volume of blood received in culture bottles   Culture   Final    NO GROWTH 3 DAYS Performed at [facility], [place] [address], [place], [place] [address]    Report Status PENDING  Incomplete  C Difficile Quick Screen w PCR reflex     Status: Abnormal   Collection Time: 02/18/22 11:30 AM   Specimen: STOOL  Result Value  Ref Range Status   C Diff antigen POSITIVE (A) NEGATIVE Final   C Diff toxin NEGATIVE NEGATIVE Final   C Diff interpretation Results are indeterminate. See PCR results.  Final    Comment: Performed at [facility], [place] [address], [place], [place] [address]  C. Diff by PCR, Reflexed     Status: Abnormal   Collection Time: 02/18/22 11:30 AM  Result Value Ref Range Status   Toxigenic C. Difficile by PCR POSITIVE (A) NEGATIVE Final    Comment: Positive for toxigenic C. difficile with little to no toxin production. Only treat if clinical presentation suggests symptomatic illness. CRITICAL RESULT CALLED TO, READ BACK BY AND VERIFIED WITH: RN Fabio Neighbors [phone] '@[id number]'$  FH Performed at [facility] [address] [place], [place] [address]     Radiology Studies: No results found.    Najla Aughenbaugh T. Way[place]  If 7PM-7AM, please contact night-coverage [email or web address] 02/21/2022, 3:49 PM

## 2022-02-21 NOTE — Progress Notes (Signed)
Pt going through severe withdrawals. Hospitalist made aware of pt's increased requirements of IV Ativan, without significant relief. New orders for phenobarbital taper. Rapid response also called, as pt's CIWA was 29 and CCM was consulted as well.   High dose of phenobarbital took about 1.5 hrs to become effective, but pt did sleep soundly for approximately 2 hours. Later in the morning, pt was found sideways in the bed, without gown on, covered in stool and blood from where she pulled out her midline IV. Pt increasingly agitated and aggressive at this time, and required IM Ativan and wrist restraints. CCM aware and with continuous orders throughout the day for additional phenobarbital. Despite significant sedatives, pt remains hostile and having severe hallucinations. Rapid response agreed to make nightshift rapid response aware.   Justice Rocher, RN

## 2022-02-21 NOTE — Progress Notes (Addendum)
NAME:  [name], MRN:  [id number], DOB:  30-Jul-1951, LOS: 4 ADMISSION DATE:  02/17/2022, CONSULTATION DATE:  6/6 REFERRING MD:  Vanita Panda, CHIEF COMPLAINT:  fall   History of Present Illness:  [name], is a 71 y.o. female, who presented to the [facility] with a chief complaint of fall.   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  Patient fell overnight 6/5-6 and hit head on her dresser while wearing her glasses. Denies LOC. Reports she "bled everywhere and cut the flap of skin off at home".   ED course was notable for hypotension (74/43), AKI, 3L IVF was administered, TMAX 100.1 WBC 13.3, lactate 2.1. Head CT negative for acute change  PCCM was consulted for admission.  Pertinent  Medical History  Systolic and diastolic HF s/p pacemaker placement, neurogenic bladder with home cathing, chronic pain, GERD   Significant Hospital Events: Including procedures, antibiotic start and stop dates in addition to other pertinent events   6/6 Present to [facility] post fall, txr to [facility]. To OR for laceration repair 6/7afebrile, sCr improving, tx out of ICU 6/8 remains on CIWA 6/9 to TRH, given frequent type 7 stools, started on oral vanc 6/10 worsening ciwa, started on phenobarbital, PCCM re-consulted for ongoing agitiation/ worsening withdrawal  Interim History / Subjective:  RN/ RRT reports patient has been confused, agitated, pulling off her gown, trying to get out of bed.    Received '8mg'$  of ativan since midnight and started on phenobarb taper this am.   Objective   Blood pressure 124/80, pulse 93, temperature 98.3 F (36.8 C), temperature source Oral, resp. rate (!) 26, height '5\' 1"'$  (1.549 m), weight 64.4 kg, SpO2 100 %.        Intake/Output Summary (Last 24 hours) at 02/21/2022 1040 Last data filed at 02/21/2022 6720 Gross per 24 hour  Intake 990 ml  Output 1450 ml  Net -460 ml   Filed  Weights   02/19/22 0326 02/20/22 0437 02/21/22 0500  Weight: 68.5 kg 68.4 kg 64.4 kg    Examination: General:  older female lying right lateral recumbent in bed in NAD HEENT: MM pink/moist, R pupil 4/reactive, large dressing to left side of face covering nose, left eye and cheek> cdi Neuro:  calm, redirectable, easily responds to verbal, states her name, otherwise confused, starting to drift off to sleep, occasionally will talk randomly, states she has to go get her mail, MAE.   CV: rr, NSR PULM:  non labored, room air, clear anteriorly  GI: soft, bs+, ND, foley  Extremities: warm/dry, no LE edema   Remains afebrile WBC 4.6 sCr continues to improve> 2.44> 2.07  6/6 CT head/CSP/maxillofacial: No acute intracranial abnormalties, LEFT facial laceration with soft tissue gas LEFT periorbital extending into pre maxillary region. C5-C6 degenerative disc disease.   6/7 TTE> EF 55-60%, no WMA, G1DD, normal RV/ PASP, mild to moderate TR   Resolved Hospital Problem list   Shock- septic/ hypovolemic  Lactic acidosis AKI  Rhabdomyolysis  Assessment & Plan:   Acute toxic/ metabolic encephalopathy ETOH withdrawals/ ETOH abuse- reportedly heavy drinker  Chronic pain - Only received total 9 mg of ativan yesterday for CIWA range 4-16.  CIWAs as high as 22 this am, '8mg'$  ativan given since midnight.  Started on phenobarbital taper this am.  This is now starting to work, more calm, and drifting off to sleep.  Suspect she was just under medicated.  Will adjust phenobarb taper with prn ativan and monitor closely.  At this time, she does not need transfer to ICU for precedex.  Can reconsider if needed.   - cont fall/ aspiration precautions - NPO while altered  - UTI treated, and started on oral vanc 6/8 for frequent diarrhea - has prn tylenol/ oxy if needed  - delirium precautions  - non focal exam.  Monitor closely  - PT/ OT - cont daily MVI/ thiamine/ folate   Addendum: called back to reassess  1315> patient since agitated, pull out her midline- now with no IV, pulled off gown, continues to yell and threaten staff intermittently.  Did receive ativan '1mg'$  IM.  Will give additional phenobarbital '65mg'$  x 1.  Initial dose of phenobarbital '130mg'$  was at 0916.  Can repeat in 30 min and increase prn ativan to '2mg'$  q 4hr if not effective.      Remainder per TRH.    Left facial laceration 2/2 fall Systolic and diastolic HF s/p pacemaker placement, EF recovered HTN-  CKD IIIb on AKI- resolving  HX neurogenic bladder requiring self cath Hypokalemia  Acute blood loss anemia- stable Thrombocytopenia- stable  Transaminitis- improving  DM2 with hyperglycemia  UTI- completed ceftriaxone x 4 days GERD Depression/ anxiety Cdiff colonization w/ diarrhea- oral vanc started 6/9 for 10 days   Best Practice (right click and "Reselect all SmartList Selections" daily)  Diet/type: Regular consistency (see orders) DVT prophylaxis: prophylactic heparin  GI prophylaxis: PPI Lines: N/A Foley:  Yes, and it is still needed Code Status:  full code Last date of multidisciplinary goals of care discussion: full code  Critical care time: n/a       Kennieth Rad, ACNP [facility] 02/21/2022, 10:40 AM  See Amion for pager If no response to pager, please call PCCM consult pager After 7:00 pm call Elink

## 2022-02-22 ENCOUNTER — Encounter (HOSPITAL_COMMUNITY): Payer: Self-pay | Admitting: Family Medicine

## 2022-02-22 DIAGNOSIS — G9341 Metabolic encephalopathy: Secondary | ICD-10-CM | POA: Diagnosis not present

## 2022-02-22 DIAGNOSIS — R571 Hypovolemic shock: Secondary | ICD-10-CM | POA: Diagnosis not present

## 2022-02-22 DIAGNOSIS — I9589 Other hypotension: Secondary | ICD-10-CM | POA: Diagnosis not present

## 2022-02-22 DIAGNOSIS — S0993XA Unspecified injury of face, initial encounter: Secondary | ICD-10-CM | POA: Diagnosis not present

## 2022-02-22 LAB — MAGNESIUM: Magnesium: 1.7 mg/dL (ref 1.7–2.4)

## 2022-02-22 LAB — COMPREHENSIVE METABOLIC PANEL
ALT: 31 U/L (ref 0–44)
AST: 42 U/L — ABNORMAL HIGH (ref 15–41)
Albumin: 2.6 g/dL — ABNORMAL LOW (ref 3.5–5.0)
Alkaline Phosphatase: 106 U/L (ref 38–126)
Anion gap: 11 (ref 5–15)
BUN: 15 mg/dL (ref 8–23)
CO2: 20 mmol/L — ABNORMAL LOW (ref 22–32)
Calcium: 8.3 mg/dL — ABNORMAL LOW (ref 8.9–10.3)
Chloride: 108 mmol/L (ref 98–111)
Creatinine, Ser: 1.86 mg/dL — ABNORMAL HIGH (ref 0.44–1.00)
GFR, Estimated: 29 mL/min — ABNORMAL LOW (ref >60)
Glucose, Bld: 73 mg/dL (ref 70–99)
Potassium: 3.3 mmol/L — ABNORMAL LOW (ref 3.5–5.1)
Sodium: 139 mmol/L (ref 135–145)
Total Bilirubin: 0.6 mg/dL (ref 0.3–1.2)
Total Protein: 5.6 g/dL — ABNORMAL LOW (ref 6.5–8.1)

## 2022-02-22 LAB — CBC
HCT: 27.9 % — ABNORMAL LOW (ref 36.0–46.0)
Hemoglobin: 9.1 g/dL — ABNORMAL LOW (ref 12.0–15.0)
MCH: 30.6 pg (ref 26.0–34.0)
MCHC: 32.6 g/dL (ref 30.0–36.0)
MCV: 93.9 fL (ref 80.0–100.0)
Platelets: 185 10*3/uL (ref 150–400)
RBC: 2.97 MIL/uL — ABNORMAL LOW (ref 3.87–5.11)
RDW: 13.9 % (ref 11.5–15.5)
WBC: 5.8 10*3/uL (ref 4.0–10.5)
nRBC: 0 % (ref 0.0–0.2)

## 2022-02-22 LAB — PHOSPHORUS: Phosphorus: 1.9 mg/dL — ABNORMAL LOW (ref 2.5–4.6)

## 2022-02-22 LAB — GLUCOSE, CAPILLARY
Glucose-Capillary: 73 mg/dL (ref 70–99)
Glucose-Capillary: 83 mg/dL (ref 70–99)
Glucose-Capillary: 122 mg/dL — ABNORMAL HIGH (ref 70–99)
Glucose-Capillary: 100 mg/dL — ABNORMAL HIGH (ref 70–99)

## 2022-02-22 LAB — CK: Total CK: 170 U/L (ref 38–234)

## 2022-02-22 MED ORDER — POTASSIUM CHLORIDE 10 MEQ/100ML IV SOLN
10.0000 meq | Freq: Once | INTRAVENOUS | Status: AC
Start: 2022-02-22 — End: 2022-02-22
  Administered 2022-02-22: 10 meq via INTRAVENOUS
  Filled 2022-02-22: qty 100

## 2022-02-22 MED ORDER — POTASSIUM CHLORIDE 10 MEQ/100ML IV SOLN
10.0000 meq | INTRAVENOUS | Status: AC
  Administered 2022-02-22 (×3): 10 meq via INTRAVENOUS
  Filled 2022-02-22 (×3): qty 100

## 2022-02-22 MED ORDER — POTASSIUM PHOSPHATES 15 MMOLE/5ML IV SOLN
30.0000 mmol | Freq: Once | INTRAVENOUS | Status: AC
  Administered 2022-02-22: 30 mmol via INTRAVENOUS
  Filled 2022-02-22 (×2): qty 10

## 2022-02-22 NOTE — Progress Notes (Signed)
NAME:  [name], MRN:  [id number], DOB:  [birth year], LOS: 5 ADMISSION DATE:  02/17/2022, CONSULTATION DATE:  6/6 REFERRING MD:  Vanita Panda, CHIEF COMPLAINT:  fall   History of Present Illness:  [name], is a 71 y.o. female, who presented to the [facility] with a chief complaint of fall.   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  Patient fell overnight 6/5-6 and hit head on her dresser while wearing her glasses. Denies LOC. Reports she "bled everywhere and cut the flap of skin off at home".   ED course was notable for hypotension (74/43), AKI, 3L IVF was administered, TMAX 100.1 WBC 13.3, lactate 2.1. Head CT negative for acute change  PCCM was consulted for admission.  Pertinent  Medical History  Systolic and diastolic HF s/p pacemaker placement, neurogenic bladder with home cathing, chronic pain, GERD   Significant Hospital Events: Including procedures, antibiotic start and stop dates in addition to other pertinent events   6/6 [facility] to [facility] post [facility], tx[facility] to [facility]. To OR for laceration repair 6/7afebrile, sCr improving, tx out of ICU 6/8 remains on CIWA 6/9 to TRH, given frequent type 7 stools, started on oral vanc 6/10 worsening ciwa, started on phenobarbital, PCCM re-consulted for ongoing agitiation/ worsening withdrawal  Interim History / Subjective:  Librium taper and valproate added yesterday.    Objective   Blood pressure (!) 141/77, pulse 85, temperature 98.7 F (37.1 C), temperature source Axillary, resp. rate 19, height '5\' 1"'$  (1.549 m), weight 62.5 kg, SpO2 97 %.        Intake/Output Summary (Last 24 hours) at 02/22/2022 1349 Last data filed at 02/22/2022 0610 Gross per 24 hour  Intake 948.75 ml  Output 1200 ml  Net -251.25 ml    Filed Weights   02/20/22 0437 02/21/22 0500 02/22/22 0500  Weight: 68.4 kg 64.4 kg 62.5 kg    Examination: General:  older female  lying sleeping HEENT: MM pink/moist, R pupil 4/reactive, large dressing to left side of face covering nose, left eye and cheek> cdi Neuro:  Sleeping on my exam   CV: rr, NSR PULM:  non labored, room air, clear anteriorly  GI: soft, bs+, ND, foley  Extremities: warm/dry, no LE edema   Remains afebrile WBC 4.6 sCr continues to improve> 2.44> 2.07  6/6 CT head/CSP/maxillofacial: No acute intracranial abnormalties, LEFT facial laceration with soft tissue gas LEFT periorbital extending into pre maxillary region. C5-C6 degenerative disc disease.   6/7 TTE> EF 55-60%, no WMA, G1DD, normal RV/ PASP, mild to moderate TR   Resolved Hospital Problem list   Shock- septic/ hypovolemic  Lactic acidosis AKI  Rhabdomyolysis  Assessment & Plan:   Acute toxic/ metabolic encephalopathy ETOH withdrawals/ ETOH abuse- reportedly heavy drinker  Chronic pain -Continue current Librium and Phenobarbital taper.   PCCM will sign off.   Remainder per TRH.    Left facial laceration 2/2 fall Systolic and diastolic HF s/p pacemaker placement, EF recovered HTN-  CKD IIIb on AKI- resolving  HX neurogenic bladder requiring self cath Hypokalemia  Acute blood loss anemia- stable Thrombocytopenia- stable  Transaminitis- improving  DM2 with hyperglycemia  UTI- completed ceftriaxone x 4 days GERD Depression/ anxiety Cdiff colonization w/ diarrhea- oral vanc started 6/9 for 10 days   Best Practice (right click and "Reselect all SmartList Selections" daily)  Diet/type: Regular consistency (see orders) DVT prophylaxis: prophylactic heparin  GI prophylaxis: PPI Lines: N/A  Foley:  Yes, and it is still needed Code Status:  full code Last date of multidisciplinary goals of care discussion: full code  Critical care time: n/a   Kipp Brood, MD [facility] ICU Physician [place]  Pager: [phone] Or Epic Secure Chat After hours: [phone].  02/22/2022, 1:54 PM      If no  response to pager, please call PCCM consult pager After 7:00 pm call Elink

## 2022-02-22 NOTE — Assessment & Plan Note (Signed)
Severe agitation requiring multiple doses of phenobarbital to control  Likely component of delirium but QTc prolongation limits therapeutic options.

## 2022-02-22 NOTE — Progress Notes (Signed)
PROGRESS NOTE  [name]:[id number] DOB: 1951/06/01   PCP: Celene Squibb, MD  Patient is from: Home  DOA: 02/17/2022 LOS: 5  Chief complaints Chief Complaint  Patient presents with   Fall     Brief Narrative / Interim history: 71 year old F with PMH of EtOH abuse, combined CHF s/p BIV PPM, CVA, DM-2, CKD-3B, neurogenic bladder with self-catheterization, chronic pain, GERD, anxiety, insomnia and depression presenting to [facility] after a fall at home the night of 6/5-6 hitting her head against a dresser while wearing her glasses.  Denies LOC.  Reportedly, "bled everywhere and cut the flap of skin off at home".  In ED, she was hypotensive to 74/44. Cr 5.6.  BUN 67.  Lactate 2.1.  Head CT negative for acute finding.  She was started on regular IV fluid and vasopressors and admitted to ICU for hypovolemic shock, AKI and acute blood loss anemia.  Patient had laceration repair by plastic surgery.  She came off vasopressors.  She is transferred to hospitalist service on 6/9.  She is currently undergoing alcohol withdrawal.  On phenobarbital taper and as needed Ativan with CIWA.  Subjective:   Objective: Vitals:   02/22/22 0707 02/22/22 0759 02/22/22 1037 02/22/22 1107  BP: (!) 157/74 (!) 152/80 (!) 142/75 (!) 141/77  Pulse: 87 91 86 85  Resp: (!) 26 18 (!) 27 19  Temp:  98.7 F (37.1 C)    TempSrc:  Axillary  Axillary  SpO2: 97% 94% 96% 97%  Weight:      Height:        Examination:  GENERAL: No apparent distress.  Nontoxic. HEENT: MMM.  Hearing grossly intact.  Dressing over left face and left eye. NECK: Supple.  No apparent JVD.  RESP:  No IWOB.  Fair aeration bilaterally. CVS:  RRR. Heart sounds normal.  ABD/GI/GU: BS+. Abd soft, NTND.  MSK/EXT:  Moves extremities. No apparent deformity. No edema.  SKIN: Dressing over left face and left eye NEURO: Awake.  Confused.  Restless.  No apparent focal neuro deficit. PSYCH: Confused.  Restless.  Procedures:  6/8-complex  left facial laceration repair by plastic surgery  Microbiology summarized: COVID-19 and influenza PCR nonreactive. MRSA PCR screen negative. Blood and urine cultures NGTD GIP negative. C. difficile PCR screen positive.  Assessment and Plan: Alcohol withdrawal syndrome/Acute toxic and metabolic encephalopathy/Agitated and trying to climb out of the bed despite phenobarbital taper and increased dose of Ativan.  PCCM added Librium taper and Depakote.  She also had bilateral wrist and abdominal restraints.  Patient is sleepy but wakes to voice this morning.  She is oriented to self and "hospital".  Not able to make sense of her speech. -Completed high-dose thiamine -Continue phenobarbital taper -Librium and Depakote added by PCCM. -IV Ativan 2 mg every 4 hours as needed agitation -Appreciate help by PCCM. -Continue restraints if safety sitter not available  Facial trauma due to fall at home In the setting of fall.  CT head without acute finding. -S/p complex laceration repair on 6/8 by plastic surgery. -Bactroban to cheek daily, Erythromycin oph oint to left eye daily, KY gel to lateral nose daily and outpatient follow-up in 10 days -Fall precaution -PT/OT eval  Hypovolemic shock ([place]) Likely hypovolemic in the setting of dehydration, alcohol abuse and acute blood loss.  Resolved.  TTE with LVEF of 55 to 60%, G1-DD, normal RV/PASP and no WMA.  -IV fluid due to poor p.o. intake in the setting of encephalopathy and alcohol withdrawal.  AKI on CKD-3A: B/l Cr 1.3-1.5.  Resolving. Recent Labs    02/17/22 0756 02/17/22 0808 02/18/22 0031 02/19/22 0355 02/20/22 1125 02/21/22 0315 02/22/22 0314  BUN 67* 66* 58* 47* 27* 23 15  CREATININE 5.58* 6.40* 4.88* 3.44* 2.44* 2.07* 1.86*  -Continue IV fluid -Avoid nephrotoxins.  ABLA superimposed on anemia of chronic disease: H&H stable after initial drop. Recent Labs    02/17/22 0756 02/17/22 0808 02/17/22 1524 02/18/22 0031  02/19/22 0355 02/20/22 1125 02/21/22 0315 02/22/22 0314  HGB 10.8* 11.2* 9.5* 8.9* 9.5* 9.0* 8.7* 9.1*  -Monitor H&H. -Check anemia panel  Clostridioides difficile infection: C. difficile toxin is negative but antigen and PCR positive.  Had some diarrhea.  She was on ceftriaxone for possible UTI. -Discontinued ceftriaxone.  She should have enough for UTI -Started p.o. vancomycin for 10 days on 6/9.  Chronic combined CHF s/p BIV PPM now with recovered EF: TTE with LVEF of 55 to 60%, G1 DD, no WMA.  No cardiopulmonary symptoms.  Appears euvolemic on exam.  On p.o. Lasix 40 mg daily at home. -Continue holding diuretics in the setting of AKI -Monitor intake and output, daily weight, renal functions and electrolytes -Resume home Coreg if able to take p.o.  Otherwise, IV metoprolol 5 mg every 6 hours -Continue holding losartan  Uncontrolled IDDM-2 with hyperglycemia and CKD-3B: A1c 7.3%. Recent Labs  Lab 02/21/22 0738 02/21/22 1139 02/21/22 1742 02/21/22 2134 02/22/22 0754  GLUCAP 144* 107* 102* 104* 73  -Discontinue basal insulin. -Continue SSI-sensitive  Urinary tract infection: Patient with neurogenic bladder and intermittent self-catheterization.  She is at risk for UTI.  UA with bacteria and pyuria.  Surprisingly urine culture is negative.  -Discontinued ceftriaxone given C. difficile.  Seems she already had 4 doses  Lactic acidosis: Resolved.  Rhabdomyolysis: Resolved.  Hypokalemia/hypophosphatemia/hypomagnesemia:  -Replenish as appropriate.  Thrombocytopenia ([place]): Resolved.  Essential hypertension, benign: BP slightly elevated -Continue p.o. Coreg 6.25 mg twice daily.  Chronic lower back pain -Continue Tylenol and oxycodone as needed  History of CVA: No focal neuro symptoms but limited exam.  CT head without acute finding. -Continue home medications when able to take p.o.  Anxiety and depression: Currently undergoing alcohol withdrawal. -Ativan as  above -Continue home Zoloft   DVT prophylaxis:  heparin injection 5,000 Units Start: 02/18/22 2200 SCDs Start: 02/17/22 1249 Place TED hose Start: 02/17/22 1249  Code Status: Full code Family Communication: Updated patient's sister, Diane over the phone. Level of care: Progressive Status is: Inpatient Remains inpatient appropriate because: Due to alcohol withdrawal syndrome, encephalopathy, AKI   Final disposition: TBD Consultants:  Pulmonology Plastic surgery   Sch Meds:  Scheduled Meds:  buPROPion  300 mg Oral Daily   carvedilol  6.25 mg Oral BID WC   chlordiazePOXIDE  25 mg Oral TID   Chlorhexidine Gluconate Cloth  6 each Topical Daily   folic acid  1 mg Oral Daily   heparin  5,000 Units Subcutaneous Q8H   insulin aspart  0-5 Units Subcutaneous QHS   insulin aspart  0-9 Units Subcutaneous TID WC   multivitamin with minerals  1 tablet Oral Daily   pantoprazole  40 mg Oral Daily   PHENObarbital  97.5 mg Intravenous Q8H   Followed by   [START ON 02/23/2022] PHENObarbital  65 mg Intravenous Q8H   Followed by   [START ON 02/25/2022] PHENObarbital  32.5 mg Intravenous Q8H   sertraline  200 mg Oral Daily   [START ON 02/23/2022] thiamine  100 mg Oral Daily  vancomycin  125 mg Oral QID   Continuous Infusions:  sodium chloride Stopped (02/17/22 1857)   lactated ringers 1,000 mL with potassium chloride 30 mEq infusion 75 mL/hr at 02/22/22 0626   potassium chloride 10 mEq (02/22/22 0839)   potassium PHOSPHATE IVPB (in mmol)     thiamine injection 250 mg (02/22/22 1114)   valproate sodium 250 mg (02/22/22 1003)   PRN Meds:.acetaminophen **OR** acetaminophen, bisacodyl, LORazepam, ondansetron **OR** ondansetron (ZOFRAN) IV, oxyCODONE, polyethylene glycol  Antimicrobials: Anti-infectives (From admission, onward)    Start     Dose/Rate Route Frequency Ordered Stop   02/20/22 1415  vancomycin (VANCOCIN) capsule 125 mg        125 mg Oral 4 times daily 02/20/22 1321 03/02/22  1359   02/18/22 0600  ceFAZolin (ANCEF) IVPB 2g/100 mL premix  Status:  Discontinued        2 g 200 mL/hr over 30 Minutes Intravenous On call to O.R. 02/17/22 1835 02/17/22 1955   02/17/22 1830  cefTRIAXone (ROCEPHIN) 2 g in sodium chloride 0.9 % 100 mL IVPB  Status:  Discontinued        2 g 200 mL/hr over 30 Minutes Intravenous Every 24 hours 02/17/22 1828 02/20/22 1316   02/17/22 1708  ceFAZolin 1 g / gentamicin 80 mg in NS 500 mL surgical irrigation  Status:  Discontinued          As needed 02/17/22 1709 02/17/22 1824   02/17/22 0800  ceFAZolin (ANCEF) IVPB 2g/100 mL premix        2 g 200 mL/hr over 30 Minutes Intravenous  Once 02/17/22 0757 02/17/22 0851        I have personally reviewed the following labs and images: CBC: Recent Labs  Lab 02/18/22 0031 02/19/22 0355 02/20/22 1125 02/21/22 0315 02/22/22 0314  WBC 9.3 5.8 4.7 4.6 5.8  HGB 8.9* 9.5* 9.0* 8.7* 9.1*  HCT 28.5* 29.7* 27.3* 25.4* 27.9*  MCV 99.3 96.1 94.8 93.0 93.9  PLT 123* 130* 141* 135* 185   BMP &GFR Recent Labs  Lab 02/18/22 0031 02/19/22 0355 02/20/22 1125 02/21/22 0315 02/22/22 0314  NA 136 141 142 140 139  K 4.3 3.1* 3.1* 3.4* 3.3*  CL 110 108 109 110 108  CO2 13* 18* 22 21* 20*  GLUCOSE 239* 183* 130* 140* 73  BUN 58* 47* 27* 23 15  CREATININE 4.88* 3.44* 2.44* 2.07* 1.86*  CALCIUM 7.7* 8.2* 8.6* 8.2* 8.3*  MG  --   --  1.8 1.8 1.7  PHOS  --   --  2.7 2.3* 1.9*   Estimated Creatinine Clearance: 24.2 mL/min (A) (by C-G formula based on SCr of 1.86 mg/dL (H)). Liver & Pancreas: Recent Labs  Lab 02/18/22 0031 02/19/22 0355 02/20/22 1125 02/21/22 0315 02/22/22 0314  AST 291* 95* 57* 74* 42*  ALT 81* 29 24 38 31  ALKPHOS 207* 165* 134* 115 106  BILITOT 1.0 0.5 0.5 0.5 0.6  PROT 6.0* 6.3* 6.0* 5.4* 5.6*  ALBUMIN 3.0* 2.9* 2.6* 2.3* 2.6*   No results for input(s): "LIPASE", "AMYLASE" in the last 168 hours. Recent Labs  Lab 02/20/22 1125  AMMONIA 28   Diabetic: No results for  input(s): "HGBA1C" in the last 72 hours.  Recent Labs  Lab 02/21/22 0738 02/21/22 1139 02/21/22 1742 02/21/22 2134 02/22/22 0754  GLUCAP 144* 107* 102* 104* 73   Cardiac Enzymes: Recent Labs  Lab 02/18/22 0031 02/20/22 1125 02/21/22 0315 02/22/22 0314  CKTOTAL 1,523* 163 101 170  No results for input(s): "PROBNP" in the last 8760 hours. Coagulation Profile: Recent Labs  Lab 02/17/22 0756 02/18/22 0031  INR 1.1 1.2   Thyroid Function Tests: No results for input(s): "TSH", "T4TOTAL", "FREET4", "T3FREE", "THYROIDAB" in the last 72 hours. Lipid Profile: No results for input(s): "CHOL", "HDL", "LDLCALC", "TRIG", "CHOLHDL", "LDLDIRECT" in the last 72 hours. Anemia Panel: Recent Labs    02/21/22 0315  VITAMINB12 1,290*  FOLATE 21.4  FERRITIN 92  TIBC 211*  IRON 27*  RETICCTPCT 1.4   Urine analysis:    Component Value Date/Time   COLORURINE YELLOW 02/17/2022 0756   APPEARANCEUR HAZY (A) 02/17/2022 0756   APPEARANCEUR Clear 02/05/2016 1300   LABSPEC 1.012 02/17/2022 0756   PHURINE 5.0 02/17/2022 0756   GLUCOSEU NEGATIVE 02/17/2022 0756   HGBUR MODERATE (A) 02/17/2022 0756   BILIRUBINUR NEGATIVE 02/17/2022 0756   BILIRUBINUR negative 09/20/2019 1545   BILIRUBINUR Negative 02/05/2016 1300   KETONESUR NEGATIVE 02/17/2022 0756   PROTEINUR 100 (A) 02/17/2022 0756   UROBILINOGEN 0.2 09/20/2019 1545   UROBILINOGEN 0.2 08/13/2014 1332   NITRITE NEGATIVE 02/17/2022 0756   LEUKOCYTESUR LARGE (A) 02/17/2022 0756   Sepsis Labs: Invalid input(s): "PROCALCITONIN", "LACTICIDVEN"  Microbiology: Recent Results (from the past 240 hour(s))  Resp Panel by RT-PCR (Flu A&B, Covid) Anterior Nasal Swab     Status: None   Collection Time: 02/17/22  7:56 AM   Specimen: Anterior Nasal Swab  Result Value Ref Range Status   SARS Coronavirus 2 by RT PCR NEGATIVE NEGATIVE Final    Comment: (NOTE) SARS-CoV-2 target nucleic acids are NOT DETECTED.  The SARS-CoV-2 RNA is generally  detectable in upper respiratory specimens during the acute phase of infection. The lowest concentration of SARS-CoV-2 viral copies this assay can detect is 138 copies/mL. A negative result does not preclude SARS-Cov-2 infection and should not be used as the sole basis for treatment or other patient management decisions. A negative result may occur with  improper specimen collection/handling, submission of specimen other than nasopharyngeal swab, presence of viral mutation(s) within the areas targeted by this assay, and inadequate number of viral copies(<138 copies/mL). A negative result must be combined with clinical observations, patient history, and epidemiological information. The expected result is Negative.  Fact Sheet for Patients:  EntrepreneurPulse.com.au  Fact Sheet for Healthcare Providers:  IncredibleEmployment.be  This test is no t yet approved or cleared by the [place] FDA and  has been authorized for detection and/or diagnosis of SARS-CoV-2 by FDA under an Emergency Use Authorization (EUA). This EUA will remain  in effect (meaning this test can be used) for the duration of the COVID-19 declaration under Section 564(b)(1) of the Act, 21 U.S.C.section 360bbb-3(b)(1), unless the authorization is terminated  or revoked sooner.       Influenza A by PCR NEGATIVE NEGATIVE Final   Influenza B by PCR NEGATIVE NEGATIVE Final    Comment: (NOTE) The Xpert Xpress SARS-CoV-2/FLU/RSV plus assay is intended as an aid in the diagnosis of influenza from Nasopharyngeal swab specimens and should not be used as a sole basis for treatment. Nasal washings and aspirates are unacceptable for Xpert Xpress SARS-CoV-2/FLU/RSV testing.  Fact Sheet for Patients: EntrepreneurPulse.com.au  Fact Sheet for Healthcare Providers: IncredibleEmployment.be  This test is not yet approved or cleared by the [place] FDA  and has been authorized for detection and/or diagnosis of SARS-CoV-2 by FDA under an Emergency Use Authorization (EUA). This EUA will remain in effect (meaning this test can be used) for the  duration of the COVID-19 declaration under Section 564(b)(1) of the Act, 21 U.S.C. section 360bbb-3(b)(1), unless the authorization is terminated or revoked.  Performed at [facility], [address], [place], [place] [address]   Surgical pcr screen     Status: None   Collection Time: 02/17/22  4:07 PM   Specimen: Nasal Mucosa; Nasal Swab  Result Value Ref Range Status   MRSA, PCR NEGATIVE NEGATIVE Final   Staphylococcus aureus NEGATIVE NEGATIVE Final    Comment: (NOTE) The Xpert SA Assay (FDA approved for NASAL specimens in patients 18 years of age and older), is one component of a comprehensive surveillance program. It is not intended to diagnose infection nor to guide or monitor treatment. Performed at [facility], [place] [address] [place], [place] [address]   Gastrointestinal Panel by PCR , Stool     Status: None   Collection Time: 02/17/22  6:50 PM   Specimen: Stool  Result Value Ref Range Status   Campylobacter species NOT DETECTED NOT DETECTED Final   Plesimonas shigelloides NOT DETECTED NOT DETECTED Final   Salmonella species NOT DETECTED NOT DETECTED Final   Yersinia enterocolitica NOT DETECTED NOT DETECTED Final   Vibrio species NOT DETECTED NOT DETECTED Final   Vibrio cholerae NOT DETECTED NOT DETECTED Final   Enteroaggregative E coli (EAEC) NOT DETECTED NOT DETECTED Final   Enteropathogenic E coli (EPEC) NOT DETECTED NOT DETECTED Final   Enterotoxigenic E coli (ETEC) NOT DETECTED NOT DETECTED Final   Shiga like toxin producing E coli (STEC) NOT DETECTED NOT DETECTED Final   Shigella/Enteroinvasive E coli (EIEC) NOT DETECTED NOT DETECTED Final   Cryptosporidium NOT DETECTED NOT DETECTED Final   Cyclospora cayetanensis NOT DETECTED NOT DETECTED Final   Entamoeba  histolytica NOT DETECTED NOT DETECTED Final   Giardia lamblia NOT DETECTED NOT DETECTED Final   Adenovirus F40/41 NOT DETECTED NOT DETECTED Final   Astrovirus NOT DETECTED NOT DETECTED Final   Norovirus GI/GII NOT DETECTED NOT DETECTED Final   Rotavirus A NOT DETECTED NOT DETECTED Final   Sapovirus (I, II, IV, and V) NOT DETECTED NOT DETECTED Final    Comment: Performed at [facility], [address] [place], [place] [address]  Urine Culture     Status: None   Collection Time: 02/17/22 10:44 PM   Specimen: Urine, Catheterized  Result Value Ref Range Status   Specimen Description URINE, CATHETERIZED  Final   Special Requests NONE  Final   Culture   Final    NO GROWTH Performed at [facility], [address], [place], [place] [address]    Report Status 02/18/2022 FINAL  Final  Culture, blood (Routine X 2) w Reflex to ID Panel     Status: None (Preliminary result)   Collection Time: 02/18/22 12:30 AM   Specimen: BLOOD RIGHT HAND  Result Value Ref Range Status   Specimen Description BLOOD RIGHT HAND  Final   Special Requests   Final    BOTTLES DRAWN AEROBIC AND ANAEROBIC Blood Culture results may not be optimal due to an inadequate volume of blood received in culture bottles   Culture   Final    NO GROWTH 3 DAYS Performed at [facility], [place] [address], [place], [place] [address]    Report Status PENDING  Incomplete  Culture, blood (Routine X 2) w Reflex to ID Panel     Status: None (Preliminary result)   Collection Time: 02/18/22 12:54 AM   Specimen: BLOOD LEFT  HAND  Result Value Ref Range Status   Specimen Description BLOOD LEFT HAND  Final   Special Requests   Final    BOTTLES DRAWN AEROBIC AND ANAEROBIC Blood Culture results may not be optimal due to an inadequate volume of blood received in culture bottles   Culture   Final    NO GROWTH 3 DAYS Performed at [facility], [place] [address], [place], [place] [address]    Report Status PENDING   Incomplete  C Difficile Quick Screen w PCR reflex     Status: Abnormal   Collection Time: 02/18/22 11:30 AM   Specimen: STOOL  Result Value Ref Range Status   C Diff antigen POSITIVE (A) NEGATIVE Final   C Diff toxin NEGATIVE NEGATIVE Final   C Diff interpretation Results are indeterminate. See PCR results.  Final    Comment: Performed at [facility], [place] [address], [place], [place] [address]  C. Diff by PCR, Reflexed     Status: Abnormal   Collection Time: 02/18/22 11:30 AM  Result Value Ref Range Status   Toxigenic C. Difficile by PCR POSITIVE (A) NEGATIVE Final    Comment: Positive for toxigenic C. difficile with little to no toxin production. Only treat if clinical presentation suggests symptomatic illness. CRITICAL RESULT CALLED TO, READ BACK BY AND VERIFIED WITH: RN Fabio Neighbors [phone] '@[id number]'$  FH Performed at [facility] [address] [place], [place] [address]     Radiology Studies: No results found.    Marietta Sikkema [address] [place]  If 7PM-7AM, please contact night-coverage [email or web address] 02/22/2022, 11:28 AM

## 2022-02-23 ENCOUNTER — Other Ambulatory Visit (HOSPITAL_COMMUNITY): Payer: Self-pay

## 2022-02-23 DIAGNOSIS — I9589 Other hypotension: Secondary | ICD-10-CM | POA: Diagnosis not present

## 2022-02-23 DIAGNOSIS — G9341 Metabolic encephalopathy: Secondary | ICD-10-CM | POA: Diagnosis not present

## 2022-02-23 DIAGNOSIS — R571 Hypovolemic shock: Secondary | ICD-10-CM | POA: Diagnosis not present

## 2022-02-23 DIAGNOSIS — S0993XA Unspecified injury of face, initial encounter: Secondary | ICD-10-CM | POA: Diagnosis not present

## 2022-02-23 LAB — RENAL FUNCTION PANEL
Albumin: 2.7 g/dL — ABNORMAL LOW (ref 3.5–5.0)
Anion gap: 12 (ref 5–15)
BUN: 9 mg/dL (ref 8–23)
CO2: 20 mmol/L — ABNORMAL LOW (ref 22–32)
Calcium: 8.5 mg/dL — ABNORMAL LOW (ref 8.9–10.3)
Chloride: 108 mmol/L (ref 98–111)
Creatinine, Ser: 1.7 mg/dL — ABNORMAL HIGH (ref 0.44–1.00)
GFR, Estimated: 32 mL/min — ABNORMAL LOW (ref >60)
Glucose, Bld: 109 mg/dL — ABNORMAL HIGH (ref 70–99)
Phosphorus: 4.3 mg/dL (ref 2.5–4.6)
Potassium: 3.9 mmol/L (ref 3.5–5.1)
Sodium: 140 mmol/L (ref 135–145)

## 2022-02-23 LAB — CBC
HCT: 30 % — ABNORMAL LOW (ref 36.0–46.0)
Hemoglobin: 10 g/dL — ABNORMAL LOW (ref 12.0–15.0)
MCH: 31.2 pg (ref 26.0–34.0)
MCHC: 33.3 g/dL (ref 30.0–36.0)
MCV: 93.5 fL (ref 80.0–100.0)
Platelets: 230 10*3/uL (ref 150–400)
RBC: 3.21 MIL/uL — ABNORMAL LOW (ref 3.87–5.11)
RDW: 13.8 % (ref 11.5–15.5)
WBC: 8.4 10*3/uL (ref 4.0–10.5)
nRBC: 0 % (ref 0.0–0.2)

## 2022-02-23 LAB — CULTURE, BLOOD (ROUTINE X 2)
Culture: NO GROWTH
Culture: NO GROWTH

## 2022-02-23 LAB — CBG MONITORING, ED: Glucose-Capillary: 222 mg/dL — ABNORMAL HIGH (ref 70–99)

## 2022-02-23 LAB — GLUCOSE, CAPILLARY
Glucose-Capillary: 108 mg/dL — ABNORMAL HIGH (ref 70–99)
Glucose-Capillary: 202 mg/dL — ABNORMAL HIGH (ref 70–99)
Glucose-Capillary: 127 mg/dL — ABNORMAL HIGH (ref 70–99)
Glucose-Capillary: 141 mg/dL — ABNORMAL HIGH (ref 70–99)

## 2022-02-23 LAB — CK: Total CK: 178 U/L (ref 38–234)

## 2022-02-23 LAB — MAGNESIUM: Magnesium: 1.8 mg/dL (ref 1.7–2.4)

## 2022-02-23 MED ORDER — CHLORDIAZEPOXIDE HCL 25 MG PO CAPS
25.0000 mg | ORAL_CAPSULE | Freq: Two times a day (BID) | ORAL | Status: DC
  Administered 2022-02-24 – 2022-02-25 (×3): 25 mg via ORAL
  Filled 2022-02-23 (×4): qty 1

## 2022-02-23 MED ORDER — ROSUVASTATIN CALCIUM 20 MG PO TABS
20.0000 mg | ORAL_TABLET | Freq: Every day | ORAL | Status: DC
  Administered 2022-02-24 – 2022-03-04 (×9): 20 mg via ORAL
  Filled 2022-02-23 (×9): qty 1

## 2022-02-23 MED ORDER — GERHARDT'S BUTT CREAM
TOPICAL_CREAM | Freq: Two times a day (BID) | CUTANEOUS | Status: DC
  Administered 2022-02-27 – 2022-03-03 (×6): 1 via TOPICAL
  Filled 2022-02-23 (×2): qty 1

## 2022-02-23 NOTE — Progress Notes (Signed)
PROGRESS NOTE  [name] [id number]:[id number] DOB: 1951/04/18   PCP: Celene Squibb, MD  Patient is from: Home  DOA: 02/17/2022 LOS: 6  Chief complaints Chief Complaint  Patient presents with   Fall     Brief Narrative / Interim history: 71 year old F with PMH of EtOH abuse, combined CHF s/p BIV PPM, CVA, DM-2, CKD-3B, neurogenic bladder with self-catheterization, chronic pain, GERD, anxiety, insomnia and depression presenting to [facility] after a fall at home the night of 6/5-6 hitting her head against a dresser while wearing her glasses.  Denies LOC.  Reportedly, "bled everywhere and cut the flap of skin off at home".  In ED, she was hypotensive to 74/44. Cr 5.6.  BUN 67.  Lactate 2.1.  Head CT negative for acute finding.  She was started on regular IV fluid and vasopressors and admitted to ICU for hypovolemic shock, AKI and acute blood loss anemia.  Patient had laceration repair by plastic surgery.  She came off vasopressors.  She is transferred to hospitalist service on 6/9.  She is currently undergoing alcohol withdrawal.  On phenobarbital taper, Librium, Depakote and as needed Ativan.  Also requiring safety sitter and restraints.   Subjective: Seen and examined earlier this morning.  No major events overnight of this morning.  She is a sleepy but wakes to voice.  She is oriented to self and place.  She is very groggy due to poor conversation.  She has bilateral restraints.  Per [employer] cabin crew, she was more alert and conversant earlier in the morning.  She ate about 40% of her breakfast.  However, she started getting agitated before she fell asleep.  Objective: Vitals:   02/23/22 0500 02/23/22 0711 02/23/22 1107 02/23/22 1434  BP:  (!) 153/75 115/67 119/60  Pulse:  70 99 95  Resp:  '20 16 17  '$ Temp:  98.3 F (36.8 C) 98 F (36.7 C) 98.2 F (36.8 C)  TempSrc:  Oral Oral Oral  SpO2:  99% 96% 98%  Weight: 61.7 kg     Height:        Examination:  GENERAL: No apparent distress.   Nontoxic. HEENT: MMM.  Vision and hearing grossly intact.  Dressing over left face and left eye. NECK: Supple.  No apparent JVD.  RESP:  No IWOB.  Fair aeration bilaterally. CVS:  RRR. Heart sounds normal.  ABD/GI/GU: BS+. Abd soft, NTND.  MSK/EXT:  Moves extremities. No apparent deformity. No edema.  Bilateral wrist restraints. SKIN: no apparent skin lesion or wound NEURO: Sleepy but wakes to voice.  Oriented to self and place.  Somewhat groggy and confused.  No apparent focal neuro deficit. PSYCH: Somewhat restless.   Procedures:  6/8-complex left facial laceration repair by plastic surgery  Microbiology summarized: COVID-19 and influenza PCR nonreactive. MRSA PCR screen negative. Blood and urine cultures NGTD GIP negative. C. difficile PCR screen positive.  Assessment and Plan: Alcohol withdrawal syndrome/Acute toxic and metabolic encephalopathy/Agitated and trying to climb out of the bed despite phenobarbital taper and increased dose of Ativan.  PCCM added Librium taper and Depakote.  She also had bilateral wrist and abdominal restraints.  Patient is sleepy but wakes to voice this morning.  She is oriented to self and "hospital".  Not able to make sense of her speech. -Completed high-dose thiamine -Continue phenobarbital taper -Librium and Depakote added by PCCM. -IV Ativan 2 mg every 4 hours as needed agitation -Appreciate help by PCCM. -Continue restraints if safety sitter not available  Facial trauma  due to fall at home In the setting of fall.  CT head without acute finding. -S/p complex laceration repair on 6/8 by plastic surgery. -Bactroban to cheek daily, Erythromycin oph oint to left eye daily, KY gel to lateral nose daily and outpatient follow-up in 10 days -Fall precaution -PT/OT eval  Hypovolemic shock ([place]) Likely hypovolemic in the setting of dehydration, alcohol abuse and acute blood loss.  Resolved.  TTE with LVEF of 55 to 60%, G1-DD, normal RV/PASP and no WMA.   -IV fluid due to poor p.o. intake in the setting of encephalopathy and alcohol withdrawal.  AKI on CKD-3A: B/l Cr 1.3-1.5.  Resolving. Recent Labs    02/17/22 0756 02/17/22 0808 02/18/22 0031 02/19/22 0355 02/20/22 1125 02/21/22 0315 02/22/22 0314 02/23/22 0635  BUN 67* 66* 58* 47* 27* '23 15 9  '$ CREATININE 5.58* 6.40* 4.88* 3.44* 2.44* 2.07* 1.86* 1.70*  -Continue IV fluid -Avoid nephrotoxins.  ABLA superimposed on anemia of chronic disease: H&H stable after initial drop.  Anemia panel without significant finding. Recent Labs    02/17/22 0756 02/17/22 0808 02/17/22 1524 02/18/22 0031 02/19/22 0355 02/20/22 1125 02/21/22 0315 02/22/22 0314 02/23/22 0635  HGB 10.8* 11.2* 9.5* 8.9* 9.5* 9.0* 8.7* 9.1* 10.0*  -Monitor H&H.  Clostridioides difficile infection: C. difficile toxin is negative but antigen and PCR positive.  She has diarrhea.  She was on ceftriaxone for possible UTI. -Ceftriaxone discontinued.  -Started p.o. vancomycin for 10 days on 6/9. -Flexi-Seal for diarrhea due to skin induration  Chronic combined CHF s/p BIV PPM now with recovered EF: TTE with LVEF of 55 to 60%, G1 DD, no WMA.  No cardiopulmonary symptoms.  Appears euvolemic on exam.  On p.o. Lasix 40 mg daily at home. -Continue holding diuretics in the setting of AKI -Monitor intake and output, daily weight, renal functions and electrolytes -Continue Coreg. -Continue holding losartan  Uncontrolled IDDM-2 with hyperglycemia and CKD-3B: A1c 7.3%. Recent Labs  Lab 02/22/22 1225 02/22/22 1620 02/22/22 2106 02/23/22 0744 02/23/22 1119  GLUCAP 83 122* 100* 108* 202*  -Continue holding basal insulin. -Continue SSI-sensitive  Urinary tract infection: Patient with neurogenic bladder and intermittent self-catheterization.  She is at risk for UTI.  UA with bacteria and pyuria.  Surprisingly urine culture is negative.  -Discontinued ceftriaxone given C. difficile.  Seems she already had 4 doses  Lactic  acidosis: Resolved.  Rhabdomyolysis: Resolved.  Hypokalemia/hypophosphatemia/hypomagnesemia:  -Replenish as appropriate.  Thrombocytopenia ([place]): Resolved.  Essential hypertension, benign: BP slightly elevated -Continue p.o. Coreg 6.25 mg twice daily.  Chronic lower back pain -Continue Tylenol and oxycodone as needed  History of CVA: No focal neuro symptoms but limited exam.  CT head without acute finding. -Continue home medications when able to take p.o.  Anxiety and depression: Currently undergoing alcohol withdrawal. -Ativan as above -Continue home Zoloft   DVT prophylaxis:  heparin injection 5,000 Units Start: 02/18/22 2200 SCDs Start: 02/17/22 1249 Place TED hose Start: 02/17/22 1249  Code Status: Full code Family Communication: Updated patient's sister, Diane over the phone. Level of care: Progressive Status is: Inpatient Remains inpatient appropriate because: Due to alcohol withdrawal syndrome, encephalopathy, AKI   Final disposition: TBD Consultants:  Pulmonology Plastic surgery   Sch Meds:  Scheduled Meds:  buPROPion  300 mg Oral Daily   carvedilol  6.25 mg Oral BID WC   chlordiazePOXIDE  25 mg Oral TID   Chlorhexidine Gluconate Cloth  6 each Topical Daily   folic acid  1 mg Oral Daily   Gerhardt's  butt cream   Topical BID   heparin  5,000 Units Subcutaneous Q8H   insulin aspart  0-5 Units Subcutaneous QHS   insulin aspart  0-9 Units Subcutaneous TID WC   multivitamin with minerals  1 tablet Oral Daily   pantoprazole  40 mg Oral Daily   PHENObarbital  97.5 mg Intravenous Q8H   Followed by   PHENObarbital  65 mg Intravenous Q8H   Followed by   [START ON 02/25/2022] PHENObarbital  32.5 mg Intravenous Q8H   [START ON 02/24/2022] rosuvastatin  20 mg Oral Daily   sertraline  200 mg Oral Daily   thiamine  100 mg Oral Daily   vancomycin  125 mg Oral QID   Continuous Infusions:  sodium chloride Stopped (02/17/22 1857)   valproate sodium 250 mg  (02/23/22 0902)   PRN Meds:.acetaminophen **OR** acetaminophen, bisacodyl, LORazepam, ondansetron **OR** ondansetron (ZOFRAN) IV, oxyCODONE, polyethylene glycol  Antimicrobials: Anti-infectives (From admission, onward)    Start     Dose/Rate Route Frequency Ordered Stop   02/20/22 1415  vancomycin (VANCOCIN) capsule 125 mg        125 mg Oral 4 times daily 02/20/22 1321 03/02/22 1359   02/18/22 0600  ceFAZolin (ANCEF) IVPB 2g/100 mL premix  Status:  Discontinued        2 g 200 mL/hr over 30 Minutes Intravenous On call to O.R. 02/17/22 1835 02/17/22 1955   02/17/22 1830  cefTRIAXone (ROCEPHIN) 2 g in sodium chloride 0.9 % 100 mL IVPB  Status:  Discontinued        2 g 200 mL/hr over 30 Minutes Intravenous Every 24 hours 02/17/22 1828 02/20/22 1316   02/17/22 1708  ceFAZolin 1 g / gentamicin 80 mg in NS 500 mL surgical irrigation  Status:  Discontinued          As needed 02/17/22 1709 02/17/22 1824   02/17/22 0800  ceFAZolin (ANCEF) IVPB 2g/100 mL premix        2 g 200 mL/hr over 30 Minutes Intravenous  Once 02/17/22 0757 02/17/22 0851        I have personally reviewed the following labs and images: CBC: Recent Labs  Lab 02/19/22 0355 02/20/22 1125 02/21/22 0315 02/22/22 0314 02/23/22 0635  WBC 5.8 4.7 4.6 5.8 8.4  HGB 9.5* 9.0* 8.7* 9.1* 10.0*  HCT 29.7* 27.3* 25.4* 27.9* 30.0*  MCV 96.1 94.8 93.0 93.9 93.5  PLT 130* 141* 135* 185 230   BMP &GFR Recent Labs  Lab 02/19/22 0355 02/20/22 1125 02/21/22 0315 02/22/22 0314 02/23/22 0635  NA 141 142 140 139 140  K 3.1* 3.1* 3.4* 3.3* 3.9  CL 108 109 110 108 108  CO2 18* 22 21* 20* 20*  GLUCOSE 183* 130* 140* 73 109*  BUN 47* 27* '23 15 9  '$ CREATININE 3.44* 2.44* 2.07* 1.86* 1.70*  CALCIUM 8.2* 8.6* 8.2* 8.3* 8.5*  MG  --  1.8 1.8 1.7 1.8  PHOS  --  2.7 2.3* 1.9* 4.3   Estimated Creatinine Clearance: 26.3 mL/min (A) (by C-G formula based on SCr of 1.7 mg/dL (H)). Liver & Pancreas: Recent Labs  Lab 02/18/22 0031  02/19/22 0355 02/20/22 1125 02/21/22 0315 02/22/22 0314 02/23/22 0635  AST 291* 95* 57* 74* 42*  --   ALT 81* 29 24 38 31  --   ALKPHOS 207* 165* 134* 115 106  --   BILITOT 1.0 0.5 0.5 0.5 0.6  --   PROT 6.0* 6.3* 6.0* 5.4* 5.6*  --   ALBUMIN  3.0* 2.9* 2.6* 2.3* 2.6* 2.7*   No results for input(s): "LIPASE", "AMYLASE" in the last 168 hours. Recent Labs  Lab 02/20/22 1125  AMMONIA 28   Diabetic: No results for input(s): "HGBA1C" in the last 72 hours.  Recent Labs  Lab 02/22/22 1225 02/22/22 1620 02/22/22 2106 02/23/22 0744 02/23/22 1119  GLUCAP 83 122* 100* 108* 202*   Cardiac Enzymes: Recent Labs  Lab 02/18/22 0031 02/20/22 1125 02/21/22 0315 02/22/22 0314 02/23/22 0635  CKTOTAL 1,523* 163 101 170 178   No results for input(s): "PROBNP" in the last 8760 hours. Coagulation Profile: Recent Labs  Lab 02/17/22 0756 02/18/22 0031  INR 1.1 1.2   Thyroid Function Tests: No results for input(s): "TSH", "T4TOTAL", "FREET4", "T3FREE", "THYROIDAB" in the last 72 hours. Lipid Profile: No results for input(s): "CHOL", "HDL", "LDLCALC", "TRIG", "CHOLHDL", "LDLDIRECT" in the last 72 hours. Anemia Panel: Recent Labs    02/21/22 0315  VITAMINB12 1,290*  FOLATE 21.4  FERRITIN 92  TIBC 211*  IRON 27*  RETICCTPCT 1.4   Urine analysis:    Component Value Date/Time   COLORURINE YELLOW 02/17/2022 0756   APPEARANCEUR HAZY (A) 02/17/2022 0756   APPEARANCEUR Clear 02/05/2016 1300   LABSPEC 1.012 02/17/2022 0756   PHURINE 5.0 02/17/2022 0756   GLUCOSEU NEGATIVE 02/17/2022 0756   HGBUR MODERATE (A) 02/17/2022 0756   BILIRUBINUR NEGATIVE 02/17/2022 0756   BILIRUBINUR negative 09/20/2019 1545   BILIRUBINUR Negative 02/05/2016 1300   KETONESUR NEGATIVE 02/17/2022 0756   PROTEINUR 100 (A) 02/17/2022 0756   UROBILINOGEN 0.2 09/20/2019 1545   UROBILINOGEN 0.2 08/13/2014 1332   NITRITE NEGATIVE 02/17/2022 0756   LEUKOCYTESUR LARGE (A) 02/17/2022 0756   Sepsis  Labs: Invalid input(s): "PROCALCITONIN", "LACTICIDVEN"  Microbiology: Recent Results (from the past 240 hour(s))  Resp Panel by RT-PCR (Flu A&B, Covid) Anterior Nasal Swab     Status: None   Collection Time: 02/17/22  7:56 AM   Specimen: Anterior Nasal Swab  Result Value Ref Range Status   SARS Coronavirus 2 by RT PCR NEGATIVE NEGATIVE Final    Comment: (NOTE) SARS-CoV-2 target nucleic acids are NOT DETECTED.  The SARS-CoV-2 RNA is generally detectable in upper respiratory specimens during the acute phase of infection. The lowest concentration of SARS-CoV-2 viral copies this assay can detect is 138 copies/mL. A negative result does not preclude SARS-Cov-2 infection and should not be used as the sole basis for treatment or other patient management decisions. A negative result may occur with  improper specimen collection/handling, submission of specimen other than nasopharyngeal swab, presence of viral mutation(s) within the areas targeted by this assay, and inadequate number of viral copies(<138 copies/mL). A negative result must be combined with clinical observations, patient history, and epidemiological information. The expected result is Negative.  Fact Sheet for Patients:  EntrepreneurPulse.com.au  Fact Sheet for Healthcare Providers:  IncredibleEmployment.be  This test is no t yet approved or cleared by the [place] FDA and  has been authorized for detection and/or diagnosis of SARS-CoV-2 by FDA under an Emergency Use Authorization (EUA). This EUA will remain  in effect (meaning this test can be used) for the duration of the COVID-19 declaration under Section 564(b)(1) of the Act, 21 U.S.C.section 360bbb-3(b)(1), unless the authorization is terminated  or revoked sooner.       Influenza A by PCR NEGATIVE NEGATIVE Final   Influenza B by PCR NEGATIVE NEGATIVE Final    Comment: (NOTE) The Xpert Xpress SARS-CoV-2/FLU/RSV plus assay  is intended as an aid  in the diagnosis of influenza from Nasopharyngeal swab specimens and should not be used as a sole basis for treatment. Nasal washings and aspirates are unacceptable for Xpert Xpress SARS-CoV-2/FLU/RSV testing.  Fact Sheet for Patients: EntrepreneurPulse.com.au  Fact Sheet for Healthcare Providers: IncredibleEmployment.be  This test is not yet approved or cleared by the [place] FDA and has been authorized for detection and/or diagnosis of SARS-CoV-2 by FDA under an Emergency Use Authorization (EUA). This EUA will remain in effect (meaning this test can be used) for the duration of the COVID-19 declaration under Section 564(b)(1) of the Act, 21 U.S.C. section 360bbb-3(b)(1), unless the authorization is terminated or revoked.  Performed at [facility], [address] [place], [place] [address]   Surgical pcr screen     Status: None   Collection Time: 02/17/22  4:07 PM   Specimen: Nasal Mucosa; Nasal Swab  Result Value Ref Range Status   MRSA, PCR NEGATIVE NEGATIVE Final   Staphylococcus aureus NEGATIVE NEGATIVE Final    Comment: (NOTE) The Xpert SA Assay (FDA approved for NASAL specimens in patients 1 years of age and older), is one component of a comprehensive surveillance program. It is not intended to diagnose infection nor to guide or monitor treatment. Performed at [facility], [place] [address], [place], [place] [address]   Gastrointestinal Panel by PCR , Stool     Status: None   Collection Time: 02/17/22  6:50 PM   Specimen: Stool  Result Value Ref Range Status   Campylobacter species NOT DETECTED NOT DETECTED Final   Plesimonas shigelloides NOT DETECTED NOT DETECTED Final   Salmonella species NOT DETECTED NOT DETECTED Final   Yersinia enterocolitica NOT DETECTED NOT DETECTED Final   Vibrio species NOT DETECTED NOT DETECTED Final   Vibrio cholerae NOT DETECTED NOT DETECTED Final   Enteroaggregative  E coli (EAEC) NOT DETECTED NOT DETECTED Final   Enteropathogenic E coli (EPEC) NOT DETECTED NOT DETECTED Final   Enterotoxigenic E coli (ETEC) NOT DETECTED NOT DETECTED Final   Shiga like toxin producing E coli (STEC) NOT DETECTED NOT DETECTED Final   Shigella/Enteroinvasive E coli (EIEC) NOT DETECTED NOT DETECTED Final   Cryptosporidium NOT DETECTED NOT DETECTED Final   Cyclospora cayetanensis NOT DETECTED NOT DETECTED Final   Entamoeba histolytica NOT DETECTED NOT DETECTED Final   Giardia lamblia NOT DETECTED NOT DETECTED Final   Adenovirus F40/41 NOT DETECTED NOT DETECTED Final   Astrovirus NOT DETECTED NOT DETECTED Final   Norovirus GI/GII NOT DETECTED NOT DETECTED Final   Rotavirus A NOT DETECTED NOT DETECTED Final   Sapovirus (I, II, IV, and V) NOT DETECTED NOT DETECTED Final    Comment: Performed at [facility], [address], [place], [place] [address]  Urine Culture     Status: None   Collection Time: 02/17/22 10:44 PM   Specimen: Urine, Catheterized  Result Value Ref Range Status   Specimen Description URINE, CATHETERIZED  Final   Special Requests NONE  Final   Culture   Final    NO GROWTH Performed at [facility], [address] [place],  [address]    Report Status 02/18/2022 FINAL  Final  Culture, blood (Routine X 2) w Reflex to ID Panel     Status: None   Collection Time: 02/18/22 12:30 AM   Specimen: BLOOD RIGHT HAND  Result Value Ref Range Status   Specimen Description BLOOD RIGHT HAND  Final   Special Requests   Final  BOTTLES DRAWN AEROBIC AND ANAEROBIC Blood Culture results may not be optimal due to an inadequate volume of blood received in culture bottles   Culture   Final    NO GROWTH 5 DAYS Performed at [facility], [place] [address] [place], [place] [address]    Report Status 02/23/2022 FINAL  Final  Culture, blood (Routine X 2) w Reflex to ID Panel     Status: None   Collection Time: 02/18/22 12:54 AM   Specimen: BLOOD  LEFT HAND  Result Value Ref Range Status   Specimen Description BLOOD LEFT HAND  Final   Special Requests   Final    BOTTLES DRAWN AEROBIC AND ANAEROBIC Blood Culture results may not be optimal due to an inadequate volume of blood received in culture bottles   Culture   Final    NO GROWTH 5 DAYS Performed at [facility], [place] [address] [place], [place] [address]    Report Status 02/23/2022 FINAL  Final  C Difficile Quick Screen w PCR reflex     Status: Abnormal   Collection Time: 02/18/22 11:30 AM   Specimen: STOOL  Result Value Ref Range Status   C Diff antigen POSITIVE (A) NEGATIVE Final   C Diff toxin NEGATIVE NEGATIVE Final   C Diff interpretation Results are indeterminate. See PCR results.  Final    Comment: Performed at [facility], [place] [address] [place], [place] [address]  C. Diff by PCR, Reflexed     Status: Abnormal   Collection Time: 02/18/22 11:30 AM  Result Value Ref Range Status   Toxigenic C. Difficile by PCR POSITIVE (A) NEGATIVE Final    Comment: Positive for toxigenic C. difficile with little to no toxin production. Only treat if clinical presentation suggests symptomatic illness. CRITICAL RESULT CALLED TO, READ BACK BY AND VERIFIED WITH: RN Fabio Neighbors [phone] '@[id number]'$  FH Performed at [facility], [address], [place],  [address]     Radiology Studies: No results found.    Timmothy Baranowski T. [place]  If 7PM-7AM, please contact night-coverage [email or web address] 02/23/2022, 4:08 PM

## 2022-02-23 NOTE — TOC Benefit Eligibility Note (Signed)
Patient Research scientist (life sciences) completed.     The patient is currently admitted and upon discharge could be taking VANCOMYCIN 125 MG.   The current 30 day co-pay is, $27.38.   The patient is insured through [employer].

## 2022-02-23 NOTE — Progress Notes (Signed)
Occupational Therapy Treatment Patient Details Name: [name] MRN: [id number] DOB: [birth year] Today's Date: 02/23/2022   History of present illness 71 y.o. female presents to [facility] hospital on 02/17/2022 s/p fall, striking head on dresser. Pt underwent laceration repair on 6/6. 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   OT comments  Ajooni did not make functional progress this session due to new confusion compared to OT evaluation. She required max A for bed mobility and transfers and was unable to progress away from the bed this date. Pt was pleasantly confused throughout needing constant re-direction and cues to initiate functional tasks. She presented with posterior leaning in sitting and standing, and was unable to don her socks this session. 3x standing facilitated to assist NT in rear hygiene and placing new sacral dressing. Pt continues to benefit from OT acutely. D/c remains appropriate in anticipation pt's cognition recovers to baseline, continue to re-assess and update d/c needs acutely.    Recommendations for follow up therapy are one component of a multi-disciplinary discharge planning process, led by the attending physician.  Recommendations may be updated based on patient status, additional functional criteria and insurance authorization.    Follow Up Recommendations  No OT follow up    Assistance Recommended at Discharge Intermittent Supervision/Assistance  Patient can return home with the following  A little help with walking and/or transfers;A little help with bathing/dressing/bathroom;Assistance with cooking/housework;Assist for transportation;Help with stairs or ramp for entrance   Equipment Recommendations  None recommended by OT       Precautions / Restrictions Precautions Precautions: Fall Precaution Comments: CIWA - bilat wrist restraints, bilat mitts, belt restrait &  sitter Restrictions Weight Bearing Restrictions: No Other Position/Activity Restrictions: has had 8 runny bowel movements since the early AM       Mobility Bed Mobility Overal bed mobility: Needs Assistance Bed Mobility: Supine to Sit, Sit to Supine     Supine to sit: Max assist Sit to supine: Max assist   General bed mobility comments: max A for cues and physcial assist. pt with posterior lean in sitting    Transfers Overall transfer level: Needs assistance Equipment used: Rolling walker (2 wheels) Transfers: Sit to/from Stand Sit to Stand: Max assist           General transfer comment: max A for 3x sit<>stands. able to take 1 lateral step wtih max A     Balance Overall balance assessment: Needs assistance Sitting-balance support: Feet supported Sitting balance-Leahy Scale: Poor Sitting balance - Comments: posterior leaning   Standing balance support: Bilateral upper extremity supported Standing balance-Leahy Scale: Poor Standing balance comment: posterior leaning                           ADL either performed or assessed with clinical judgement   ADL Overall ADL's : Needs assistance/impaired                     Lower Body Dressing: Maximal assistance;Sit to/from stand Lower Body Dressing Details (indicate cue type and reason): pt unable to don socks Toilet Transfer: Maximal assistance;Rolling walker (2 wheels);BSC/3in1 Toilet Transfer Details (indicate cue type and reason): simulated at the bed side         Functional mobility during ADLs: Maximal assistance General ADL Comments: needed significantly increased assist for all tasks this date    Extremity/Trunk Assessment Upper Extremity Assessment Upper Extremity  Assessment: Overall WFL for tasks assessed   Lower Extremity Assessment Lower Extremity Assessment: Overall WFL for tasks assessed        Vision   Vision Assessment?: No apparent visual deficits Additional Comments: L  eye covered by gauze   Perception Perception Perception: Not tested   Praxis Praxis Praxis: Not tested    Cognition Arousal/Alertness: Lethargic Behavior During Therapy: Flat affect, Impulsive Overall Cognitive Status: Impaired/Different from baseline Area of Impairment: Problem solving, Safety/judgement, Awareness, Memory, Following commands, Attention, Orientation                 Orientation Level: Time, Situation, Place Current Attention Level: Selective Memory: Decreased recall of precautions, Decreased short-term memory Following Commands: Follows one step commands inconsistently, Follows one step commands with increased time Safety/Judgement: Decreased awareness of deficits Awareness: Intellectual Problem Solving: Slow processing, Decreased initiation, Difficulty sequencing, Requires verbal cues, Requires tactile cues General Comments: Extremely confused, oriented to self only. follows most simple one step commands with increased cues for re-direction. pleasant, impulsive        Exercises      Shoulder Instructions       General Comments sitter present and assisting as needed    Pertinent Vitals/ Pain       Pain Assessment Pain Assessment: No/denies pain Pain Intervention(s): Monitored during session   Frequency  Min 2X/week        Progress Toward Goals  OT Goals(current goals can now be found in the care plan section)  Progress towards OT goals: Progressing toward goals  Acute Rehab OT Goals Patient Stated Goal: unable to state OT Goal Formulation: With patient Time For Goal Achievement: 03/04/22 Potential to Achieve Goals: Good ADL Goals Pt Will Perform Upper Body Dressing: Independently;sitting Pt Will Perform Lower Body Dressing: Independently;sit to/from stand Pt Will Transfer to Toilet: Independently;ambulating Additional ADL Goal #1: Pt will independently complete IADL medication managment task Additional ADL Goal #2: Pt will tolerate  at least 8 minutes of OOB standing functional activity  Plan Discharge plan remains appropriate (pending pt progress)       AM-PAC OT "6 Clicks" Daily Activity     Outcome Measure   Help from another person eating meals?: A Lot Help from another person taking care of personal grooming?: A Lot Help from another person toileting, which includes using toliet, bedpan, or urinal?: A Lot Help from another person bathing (including washing, rinsing, drying)?: A Lot Help from another person to put on and taking off regular upper body clothing?: A Lot Help from another person to put on and taking off regular lower body clothing?: A Lot 6 Click Score: 12    End of Session Equipment Utilized During Treatment: Rolling walker (2 wheels)  OT Visit Diagnosis: Unsteadiness on feet (R26.81);History of falling (Z91.81)   Activity Tolerance Patient tolerated treatment well   Patient Left in bed;with call bell/phone within reach;with bed alarm set;with nursing/sitter in room   Nurse Communication Mobility status        Time: 1653 (1610)-9604 OT Time Calculation (min): 24 min  Charges: OT General Charges $OT Visit: 1 Visit OT Treatments $Therapeutic Activity: 8-22 mins    Marifer Hurd A Jayliana Valencia 02/23/2022, 5:55 PM

## 2022-02-23 NOTE — Progress Notes (Signed)
Physical Therapy Treatment Patient Details Name: [name] MRN: [id number] DOB: 07-13-1951 Today's Date: 02/23/2022   History of Present Illness 71 y.o. female presents to [facility] hospital on 02/17/2022 s/p fall, striking head on dresser. Pt underwent laceration repair on 6/6. 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    PT Comments    Pt was seen for mobility but is having a continual issue of GI runoff, with a flexiseal planned.  Pt was seen for just there exercise instead, with active assisted LE routine completed.  Pt is sleepy, tends to avoid interaction at times but can complete the exercises with minor  help.  Follow up with her to continue gait when the flexi has been taken care of, and work on strength, balance and control of gait as pt permits. Recommend her to follow up with PT in her next setting to see what needs have developed.   Recommendations for follow up therapy are one component of a multi-disciplinary discharge planning process, led by the attending physician.  Recommendations may be updated based on patient status, additional functional criteria and insurance authorization.  Follow Up Recommendations  No PT follow up     Assistance Recommended at Discharge PRN  Patient can return home with the following Assistance with cooking/housework;Assist for transportation;Help with stairs or ramp for entrance   Equipment Recommendations  Rolling walker (2 wheels)    Recommendations for Other Services       Precautions / Restrictions Precautions Precautions: Fall Precaution Comments: confused and wearing mitts Restrictions Weight Bearing Restrictions: No Other Position/Activity Restrictions: has had 8 runny bowel movements since the early AM     Mobility  Bed Mobility               General bed mobility comments: did not get up to side of bed due to continual GI runoff     Transfers                   General transfer comment: deferred    Ambulation/Gait                   Stairs             Wheelchair Mobility    Modified Rankin (Stroke Patients Only)       Balance Overall balance assessment: History of Falls, Needs assistance                                          Cognition Arousal/Alertness: Lethargic Behavior During Therapy: Flat affect, Impulsive Overall Cognitive Status: Impaired/Different from baseline Area of Impairment: Problem solving, Safety/judgement, Awareness, Memory, Following commands, Attention, Orientation                 Orientation Level: Time, Situation, Place Current Attention Level: Selective Memory: Decreased recall of precautions, Decreased short-term memory Following Commands: Follows one step commands inconsistently, Follows one step commands with increased time Safety/Judgement: Decreased awareness of deficits Awareness: Intellectual Problem Solving: Slow processing, Decreased initiation, Difficulty sequencing, Requires verbal cues, Requires tactile cues General Comments: pt is keeping her R eye closed, struggling to keep moving wiht LE's and a bit lethargic        Exercises General Exercises - Lower Extremity Ankle Circles/Pumps: AAROM, 5 reps Long Arc Quad: AAROM, 10 reps Heel Slides: AAROM, 10  reps Hip ABduction/ADduction: AAROM, 10 reps Straight Leg Raises: AAROM, 10 reps Hip Flexion/Marching: AROM, 10 reps    General Comments General comments (skin integrity, edema, etc.): pt was assisted to get through LE ther exercises including stretches and strengthening, with good effort of all but slow to follow commands      Pertinent Vitals/Pain Pain Assessment Pain Assessment: Faces Faces Pain Scale: Hurts a little bit Pain Location: eye Pain Descriptors / Indicators: Guarding Pain Intervention(s): Monitored during session, Repositioned    Home Living                           Prior Function            PT Goals (current goals can now be found in the care plan section) Acute Rehab PT Goals Patient Stated Goal: none stated Progress towards PT goals: Not progressing toward goals - comment    Frequency    Min 3X/week      PT Plan Current plan remains appropriate    Co-evaluation              AM-PAC PT "6 Clicks" Mobility   Outcome Measure  Help needed turning from your back to your side while in a flat bed without using bedrails?: A Little Help needed moving from lying on your back to sitting on the side of a flat bed without using bedrails?: A Little Help needed moving to and from a bed to a chair (including a wheelchair)?: A Little Help needed standing up from a chair using your arms (e.g., wheelchair or bedside chair)?: A Little Help needed to walk in hospital room?: A Little Help needed climbing 3-5 steps with a railing? : A Little 6 Click Score: 18    End of Session   Activity Tolerance: Patient tolerated treatment well;Patient limited by fatigue Patient left: in bed;with call bell/phone within reach;with bed alarm set Nurse Communication: Mobility status PT Visit Diagnosis: Unsteadiness on feet (R26.81);Other abnormalities of gait and mobility (R26.89);History of falling (Z91.81)     Time: 2841-3244 PT Time Calculation (min) (ACUTE ONLY): 23 min  Charges:  $Therapeutic Exercise: 23-37 mins        Ramond Dial 02/23/2022, 3:11 PM  Mee Hives, PT PhD Acute Rehab Dept. Number: [place] [facility] [place]

## 2022-02-24 DIAGNOSIS — R571 Hypovolemic shock: Secondary | ICD-10-CM | POA: Diagnosis not present

## 2022-02-24 DIAGNOSIS — E872 Acidosis, unspecified: Secondary | ICD-10-CM

## 2022-02-24 DIAGNOSIS — G9341 Metabolic encephalopathy: Secondary | ICD-10-CM | POA: Diagnosis not present

## 2022-02-24 DIAGNOSIS — I9589 Other hypotension: Secondary | ICD-10-CM | POA: Diagnosis not present

## 2022-02-24 DIAGNOSIS — S0993XA Unspecified injury of face, initial encounter: Secondary | ICD-10-CM | POA: Diagnosis not present

## 2022-02-24 LAB — RENAL FUNCTION PANEL
Albumin: 2.6 g/dL — ABNORMAL LOW (ref 3.5–5.0)
Anion gap: 13 (ref 5–15)
BUN: 10 mg/dL (ref 8–23)
CO2: 18 mmol/L — ABNORMAL LOW (ref 22–32)
Calcium: 8.4 mg/dL — ABNORMAL LOW (ref 8.9–10.3)
Chloride: 108 mmol/L (ref 98–111)
Creatinine, Ser: 1.74 mg/dL — ABNORMAL HIGH (ref 0.44–1.00)
GFR, Estimated: 31 mL/min — ABNORMAL LOW (ref >60)
Glucose, Bld: 115 mg/dL — ABNORMAL HIGH (ref 70–99)
Phosphorus: 4.4 mg/dL (ref 2.5–4.6)
Potassium: 3.7 mmol/L (ref 3.5–5.1)
Sodium: 139 mmol/L (ref 135–145)

## 2022-02-24 LAB — MAGNESIUM: Magnesium: 1.9 mg/dL (ref 1.7–2.4)

## 2022-02-24 LAB — CBC
HCT: 28.9 % — ABNORMAL LOW (ref 36.0–46.0)
Hemoglobin: 9.2 g/dL — ABNORMAL LOW (ref 12.0–15.0)
MCH: 30.5 pg (ref 26.0–34.0)
MCHC: 31.8 g/dL (ref 30.0–36.0)
MCV: 95.7 fL (ref 80.0–100.0)
Platelets: 250 10*3/uL (ref 150–400)
RBC: 3.02 MIL/uL — ABNORMAL LOW (ref 3.87–5.11)
RDW: 14.4 % (ref 11.5–15.5)
WBC: 7.9 10*3/uL (ref 4.0–10.5)
nRBC: 0 % (ref 0.0–0.2)

## 2022-02-24 LAB — GLUCOSE, CAPILLARY
Glucose-Capillary: 124 mg/dL — ABNORMAL HIGH (ref 70–99)
Glucose-Capillary: 133 mg/dL — ABNORMAL HIGH (ref 70–99)
Glucose-Capillary: 92 mg/dL (ref 70–99)
Glucose-Capillary: 109 mg/dL — ABNORMAL HIGH (ref 70–99)

## 2022-02-24 MED ORDER — SODIUM BICARBONATE 650 MG PO TABS
650.0000 mg | ORAL_TABLET | Freq: Three times a day (TID) | ORAL | Status: DC
Start: 2022-02-24 — End: 2022-03-05
  Administered 2022-02-24 – 2022-03-04 (×25): 650 mg via ORAL
  Filled 2022-02-24 (×26): qty 1

## 2022-02-24 MED ORDER — PHENOBARBITAL 32.4 MG PO TABS
32.4000 mg | ORAL_TABLET | Freq: Three times a day (TID) | ORAL | Status: AC
  Administered 2022-02-25 – 2022-02-26 (×5): 32.4 mg via ORAL
  Filled 2022-02-24 (×5): qty 1

## 2022-02-24 MED ORDER — PHENOBARBITAL 32.4 MG PO TABS
64.8000 mg | ORAL_TABLET | Freq: Three times a day (TID) | ORAL | Status: AC
Start: 2022-02-25 — End: 2022-02-25
  Administered 2022-02-24 – 2022-02-25 (×2): 64.8 mg via ORAL
  Filled 2022-02-24 (×2): qty 2

## 2022-02-24 NOTE — Progress Notes (Signed)
PROGRESS NOTE  AUDIANNA [name] RAQ:[id number] DOB: 02-02-1951   PCP: Celene Squibb, MD  Patient is from: Home  DOA: 02/17/2022 LOS: 7  Chief complaints Chief Complaint  Patient presents with   Fall     Brief Narrative / Interim history: 71 year old F with PMH of EtOH abuse, combined CHF s/p BIV PPM, CVA, DM-2, CKD-3B, neurogenic bladder with self-catheterization, chronic pain, GERD, anxiety, insomnia and depression presenting to [facility] after a fall at home the night of 6/5-6 hitting her head against a dresser while wearing her glasses.  Denies LOC.  Reportedly, "bled everywhere and cut the flap of skin off at home".  In ED, she was hypotensive to 74/44. Cr 5.6.  BUN 67.  Lactate 2.1.  Head CT negative for acute finding.  She was started on regular IV fluid and vasopressors and admitted to ICU for hypovolemic shock, AKI and acute blood loss anemia.  Patient had laceration repair by plastic surgery.  She came off vasopressors.  She is transferred to hospitalist service on 6/9.  She is currently undergoing alcohol withdrawal.  On phenobarbital taper, Librium, Depakote and as needed Ativan.  Also requiring restraints.  Subjective: Per RN, patient was confused and agitated.  She also pulled out her Flexi-Seal.  She was given Ativan 2 mg about 6 AM and 10 AM.  She was sleepy and groggy when I saw her.  Does not appear to be in distress.  She has bilateral wrist restraints and abdominal restraints.   Objective: Vitals:   02/24/22 0005 02/24/22 0324 02/24/22 0802 02/24/22 1106  BP: (!) 110/54 127/60 126/60 (!) 116/58  Pulse: 92 94 (!) 101 91  Resp: (!) 27 (!) 27 (!) 24 (!) 28  Temp: 99 F (37.2 C) 98.3 F (36.8 C) 98.1 F (36.7 C) 98.2 F (36.8 C)  TempSrc: Oral Oral Oral Oral  SpO2: 96% 98% 96% 92%  Weight:      Height:        Examination:  GENERAL: No apparent distress.  Nontoxic. HEENT: MMM.  Vision and hearing grossly intact.  NECK: Supple.  No apparent JVD.  RESP:  No IWOB.   Fair aeration bilaterally. CVS:  RRR. Heart sounds normal.  ABD/GI/GU: BS+. Abd soft, NTND.  Abdominal restraints.  Indwelling Foley.  Rectal tube. MSK/EXT:  Moves extremities. No apparent deformity. No edema.  Bilateral wrist restraints. SKIN: no apparent skin lesion or wound NEURO: Sleepy and groggy.  No apparent focal neuro deficit. PSYCH: Sleepy and groggy.  Procedures:  6/8-complex left facial laceration repair by plastic surgery  Microbiology summarized: COVID-19 and influenza PCR nonreactive. MRSA PCR screen negative. Blood and urine cultures NGTD GIP negative. C. difficile PCR screen positive.  Assessment and Plan: Alcohol withdrawal syndrome/Acute toxic and metabolic encephalopathy: Remains confused and agitated at times.  Difficult to redirect.  Pulled her Flexi-Seal this morning.  -Completed high-dose thiamine -On phenobarbital taper, Librium, Depakote and as needed Ativan -Continue soft restraints due to risk for fall and serious injury. -PCCM signed off.  Facial trauma due to fall at home In the setting of fall.  CT head without acute finding. -S/p complex laceration repair on 6/8 by plastic surgery. -Bactroban to cheek, erythromycin oph oint to left eye and KY gel to lateral nose daily -Outpatient follow-up with plastic surgery in 10 days -Fall precaution -PT/OT eval  Hypovolemic shock ([place]) Likely hypovolemic in the setting of dehydration, alcohol abuse and acute blood loss.  Resolved.  TTE with LVEF of 55 to  60%, G1-DD, normal RV/PASP and no WMA.  -IV fluid due to poor p.o. intake in the setting of encephalopathy and alcohol withdrawal.  AKI on CKD-3A: B/l Cr 1.3-1.5.  Resolving. Recent Labs    02/17/22 0756 02/17/22 0808 02/18/22 0031 02/19/22 0355 02/20/22 1125 02/21/22 0315 02/22/22 0314 02/23/22 0635 02/24/22 0237  BUN 67* 66* 58* 47* 27* '23 15 9 10  '$ CREATININE 5.58* 6.40* 4.88* 3.44* 2.44* 2.07* 1.86* 1.70* 1.74*  -Continue IV fluid -Avoid  nephrotoxins.  ABLA superimposed on anemia of chronic disease: H&H stable.  Anemia panel without significant finding. Recent Labs    02/17/22 0756 02/17/22 0808 02/17/22 1524 02/18/22 0031 02/19/22 0355 02/20/22 1125 02/21/22 0315 02/22/22 0314 02/23/22 0635 02/24/22 0237  HGB 10.8* 11.2* 9.5* 8.9* 9.5* 9.0* 8.7* 9.1* 10.0* 9.2*  -Monitor H&H.  Clostridioides difficile infection: C. difficile toxin is negative but antigen and PCR positive. She was on ceftriaxone for possible UTI.  Diarrhea seems to have subsided. -Ceftriaxone discontinued.  -Continue p.o. 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  No cardiopulmonary symptoms.  Appears euvolemic on exam.  On p.o. Lasix 40 mg daily at home. -Continue holding diuretics in the setting of AKI -Monitor intake and output, daily weight, renal functions and electrolytes -Continue Coreg. -Continue holding losartan  Uncontrolled IDDM-2 with hyperglycemia and CKD-3B: A1c 7.3%. Recent Labs  Lab 02/23/22 1119 02/23/22 1612 02/23/22 2224 02/24/22 0817 02/24/22 1112  GLUCAP 202* 127* 141* 124* 133*  -Continue holding basal insulin. -Continue SSI-sensitive  Urinary tract infection: Patient with neurogenic bladder and intermittent self-catheterization.  She is at risk for UTI.  UA with bacteria and pyuria.  Surprisingly urine culture is negative.  -Discontinued ceftriaxone given C. difficile.  Seems she already had 4 doses  Metabolic acidosis -P.o. sodium bicarbonate 650 mg 3 times daily  Hypokalemia/hypophosphatemia/hypomagnesemia:  -Replenish as appropriate.  Thrombocytopenia ([place]): Resolved.  Essential hypertension, benign: BP slightly elevated -Continue p.o. Coreg 6.25 mg twice daily.  Chronic lower back pain -Continue Tylenol and oxycodone as needed  History of CVA: No focal neuro  symptoms but limited exam.  CT head without acute finding. -Continue home medications when able to take p.o.  Anxiety and depression: Currently undergoing alcohol withdrawal. -Ativan as above -Continue home Zoloft  Lactic acidosis: Resolved.  Rhabdomyolysis: Resolved.  DVT prophylaxis:  heparin injection 5,000 Units Start: 02/18/22 2200 SCDs Start: 02/17/22 1249 Place TED hose Start: 02/17/22 1249  Code Status: Full code Family Communication: None at bedside today. Level of care: Progressive Status is: Inpatient Remains inpatient appropriate because: Due to alcohol withdrawal syndrome, encephalopathy, AKI   Final disposition: TBD Consultants:  Pulmonology-signed off Plastic surgery-signed off   Sch Meds:  Scheduled Meds:  buPROPion  300 mg Oral Daily   carvedilol  6.25 mg Oral BID WC   chlordiazePOXIDE  25 mg Oral BID   Chlorhexidine Gluconate Cloth  6 each Topical Daily   folic acid  1 mg Oral Daily   Gerhardt's butt cream   Topical BID   heparin  5,000 Units Subcutaneous Q8H   insulin aspart  0-5 Units Subcutaneous QHS   insulin aspart  0-9 Units Subcutaneous TID WC   multivitamin with minerals  1 tablet Oral Daily   pantoprazole  40 mg Oral Daily   PHENObarbital  65 mg Intravenous Q8H   Followed by   [  START ON 02/25/2022] PHENObarbital  32.5 mg Intravenous Q8H   rosuvastatin  20 mg Oral Daily   sertraline  200 mg Oral Daily   sodium bicarbonate  650 mg Oral TID   thiamine  100 mg Oral Daily   vancomycin  125 mg Oral QID   Continuous Infusions:  sodium chloride Stopped (02/17/22 1857)   valproate sodium 250 mg (02/24/22 0843)   PRN Meds:.acetaminophen **OR** acetaminophen, bisacodyl, LORazepam, ondansetron **OR** ondansetron (ZOFRAN) IV, oxyCODONE  Antimicrobials: Anti-infectives (From admission, onward)    Start     Dose/Rate Route Frequency Ordered Stop   02/20/22 1415  vancomycin (VANCOCIN) capsule 125 mg        125 mg Oral 4 times daily 02/20/22 1321  03/02/22 1359   02/18/22 0600  ceFAZolin (ANCEF) IVPB 2g/100 mL premix  Status:  Discontinued        2 g 200 mL/hr over 30 Minutes Intravenous On call to O.R. 02/17/22 1835 02/17/22 1955   02/17/22 1830  cefTRIAXone (ROCEPHIN) 2 g in sodium chloride 0.9 % 100 mL IVPB  Status:  Discontinued        2 g 200 mL/hr over 30 Minutes Intravenous Every 24 hours 02/17/22 1828 02/20/22 1316   02/17/22 1708  ceFAZolin 1 g / gentamicin 80 mg in NS 500 mL surgical irrigation  Status:  Discontinued          As needed 02/17/22 1709 02/17/22 1824   02/17/22 0800  ceFAZolin (ANCEF) IVPB 2g/100 mL premix        2 g 200 mL/hr over 30 Minutes Intravenous  Once 02/17/22 0757 02/17/22 0851        I have personally reviewed the following labs and images: CBC: Recent Labs  Lab 02/20/22 1125 02/21/22 0315 02/22/22 0314 02/23/22 0635 02/24/22 0237  WBC 4.7 4.6 5.8 8.4 7.9  HGB 9.0* 8.7* 9.1* 10.0* 9.2*  HCT 27.3* 25.4* 27.9* 30.0* 28.9*  MCV 94.8 93.0 93.9 93.5 95.7  PLT 141* 135* 185 230 250   BMP &GFR Recent Labs  Lab 02/20/22 1125 02/21/22 0315 02/22/22 0314 02/23/22 0635 02/24/22 0237  NA 142 140 139 140 139  K 3.1* 3.4* 3.3* 3.9 3.7  CL 109 110 108 108 108  CO2 22 21* 20* 20* 18*  GLUCOSE 130* 140* 73 109* 115*  BUN 27* '23 15 9 10  '$ CREATININE 2.44* 2.07* 1.86* 1.70* 1.74*  CALCIUM 8.6* 8.2* 8.3* 8.5* 8.4*  MG 1.8 1.8 1.7 1.8 1.9  PHOS 2.7 2.3* 1.9* 4.3 4.4   Estimated Creatinine Clearance: 25.7 mL/min (A) (by C-G formula based on SCr of 1.74 mg/dL (H)). Liver & Pancreas: Recent Labs  Lab 02/18/22 0031 02/19/22 0355 02/20/22 1125 02/21/22 0315 02/22/22 0314 02/23/22 0635 02/24/22 0237  AST 291* 95* 57* 74* 42*  --   --   ALT 81* 29 24 38 31  --   --   ALKPHOS 207* 165* 134* 115 106  --   --   BILITOT 1.0 0.5 0.5 0.5 0.6  --   --   PROT 6.0* 6.3* 6.0* 5.4* 5.6*  --   --   ALBUMIN 3.0* 2.9* 2.6* 2.3* 2.6* 2.7* 2.6*   No results for input(s): "LIPASE", "AMYLASE" in the last  168 hours. Recent Labs  Lab 02/20/22 1125  AMMONIA 28   Diabetic: No results for input(s): "HGBA1C" in the last 72 hours.  Recent Labs  Lab 02/23/22 1119 02/23/22 1612 02/23/22 2224 02/24/22 0817 02/24/22 1112  GLUCAP 202*  127* 141* 124* 133*   Cardiac Enzymes: Recent Labs  Lab 02/18/22 0031 02/20/22 1125 02/21/22 0315 02/22/22 0314 02/23/22 0635  CKTOTAL 1,523* 163 101 170 178   No results for input(s): "PROBNP" in the last 8760 hours. Coagulation Profile: Recent Labs  Lab 02/18/22 0031  INR 1.2   Thyroid Function Tests: No results for input(s): "TSH", "T4TOTAL", "FREET4", "T3FREE", "THYROIDAB" in the last 72 hours. Lipid Profile: No results for input(s): "CHOL", "HDL", "LDLCALC", "TRIG", "CHOLHDL", "LDLDIRECT" in the last 72 hours. Anemia Panel: No results for input(s): "VITAMINB12", "FOLATE", "FERRITIN", "TIBC", "IRON", "RETICCTPCT" in the last 72 hours.  Urine analysis:    Component Value Date/Time   COLORURINE YELLOW 02/17/2022 0756   APPEARANCEUR HAZY (A) 02/17/2022 0756   APPEARANCEUR Clear 02/05/2016 1300   LABSPEC 1.012 02/17/2022 0756   PHURINE 5.0 02/17/2022 0756   GLUCOSEU NEGATIVE 02/17/2022 0756   HGBUR MODERATE (A) 02/17/2022 0756   BILIRUBINUR NEGATIVE 02/17/2022 0756   BILIRUBINUR negative 09/20/2019 1545   BILIRUBINUR Negative 02/05/2016 1300   KETONESUR NEGATIVE 02/17/2022 0756   PROTEINUR 100 (A) 02/17/2022 0756   UROBILINOGEN 0.2 09/20/2019 1545   UROBILINOGEN 0.2 08/13/2014 1332   NITRITE NEGATIVE 02/17/2022 0756   LEUKOCYTESUR LARGE (A) 02/17/2022 0756   Sepsis Labs: Invalid input(s): "PROCALCITONIN", "LACTICIDVEN"  Microbiology: Recent Results (from the past 240 hour(s))  Resp Panel by RT-PCR (Flu A&B, Covid) Anterior Nasal Swab     Status: None   Collection Time: 02/17/22  7:56 AM   Specimen: Anterior Nasal Swab  Result Value Ref Range Status   SARS Coronavirus 2 by RT PCR NEGATIVE NEGATIVE Final    Comment:  (NOTE) SARS-CoV-2 target nucleic acids are NOT DETECTED.  The SARS-CoV-2 RNA is generally detectable in upper respiratory specimens during the acute phase of infection. The lowest concentration of SARS-CoV-2 viral copies this assay can detect is 138 copies/mL. A negative result does not preclude SARS-Cov-2 infection and should not be used as the sole basis for treatment or other patient management decisions. A negative result may occur with  improper specimen collection/handling, submission of specimen other than nasopharyngeal swab, presence of viral mutation(s) within the areas targeted by this assay, and inadequate number of viral copies(<138 copies/mL). A negative result must be combined with clinical observations, patient history, and epidemiological information. The expected result is Negative.  Fact Sheet for Patients:  EntrepreneurPulse.com.au  Fact Sheet for Healthcare Providers:  IncredibleEmployment.be  This test is no t yet approved or cleared by the [place] FDA and  has been authorized for detection and/or diagnosis of SARS-CoV-2 by FDA under an Emergency Use Authorization (EUA). This EUA will remain  in effect (meaning this test can be used) for the duration of the COVID-19 declaration under Section 564(b)(1) of the Act, 21 U.S.C.section 360bbb-3(b)(1), unless the authorization is terminated  or revoked sooner.       Influenza A by PCR NEGATIVE NEGATIVE Final   Influenza B by PCR NEGATIVE NEGATIVE Final    Comment: (NOTE) The Xpert Xpress SARS-CoV-2/FLU/RSV plus assay is intended as an aid in the diagnosis of influenza from Nasopharyngeal swab specimens and should not be used as a sole basis for treatment. Nasal washings and aspirates are unacceptable for Xpert Xpress SARS-CoV-2/FLU/RSV testing.  Fact Sheet for Patients: EntrepreneurPulse.com.au  Fact Sheet for Healthcare  Providers: IncredibleEmployment.be  This test is not yet approved or cleared by the [place] FDA and has been authorized for detection and/or diagnosis of SARS-CoV-2 by FDA under an Emergency  Use Authorization (EUA). This EUA will remain in effect (meaning this test can be used) for the duration of the COVID-19 declaration under Section 564(b)(1) of the Act, 21 U.S.C. section 360bbb-3(b)(1), unless the authorization is terminated or revoked.  Performed at [facility], [address], [place], [place] [address]   Surgical pcr screen     Status: None   Collection Time: 02/17/22  4:07 PM   Specimen: Nasal Mucosa; Nasal Swab  Result Value Ref Range Status   MRSA, PCR NEGATIVE NEGATIVE Final   Staphylococcus aureus NEGATIVE NEGATIVE Final    Comment: (NOTE) The Xpert SA Assay (FDA approved for NASAL specimens in patients 21 years of age and older), is one component of a comprehensive surveillance program. It is not intended to diagnose infection nor to guide or monitor treatment. Performed at [facility], [place] [address] [place], [place] [address]   Gastrointestinal Panel by PCR , Stool     Status: None   Collection Time: 02/17/22  6:50 PM   Specimen: Stool  Result Value Ref Range Status   Campylobacter species NOT DETECTED NOT DETECTED Final   Plesimonas shigelloides NOT DETECTED NOT DETECTED Final   Salmonella species NOT DETECTED NOT DETECTED Final   Yersinia enterocolitica NOT DETECTED NOT DETECTED Final   Vibrio species NOT DETECTED NOT DETECTED Final   Vibrio cholerae NOT DETECTED NOT DETECTED Final   Enteroaggregative E coli (EAEC) NOT DETECTED NOT DETECTED Final   Enteropathogenic E coli (EPEC) NOT DETECTED NOT DETECTED Final   Enterotoxigenic E coli (ETEC) NOT DETECTED NOT DETECTED Final   Shiga like toxin producing E coli (STEC) NOT DETECTED NOT DETECTED Final   Shigella/Enteroinvasive E coli (EIEC) NOT DETECTED NOT DETECTED Final    Cryptosporidium NOT DETECTED NOT DETECTED Final   Cyclospora cayetanensis NOT DETECTED NOT DETECTED Final   Entamoeba histolytica NOT DETECTED NOT DETECTED Final   Giardia lamblia NOT DETECTED NOT DETECTED Final   Adenovirus F40/41 NOT DETECTED NOT DETECTED Final   Astrovirus NOT DETECTED NOT DETECTED Final   Norovirus GI/GII NOT DETECTED NOT DETECTED Final   Rotavirus A NOT DETECTED NOT DETECTED Final   Sapovirus (I, II, IV, and V) NOT DETECTED NOT DETECTED Final    Comment: Performed at [facility], [address], [place], [place] [address]  Urine Culture     Status: None   Collection Time: 02/17/22 10:44 PM   Specimen: Urine, Catheterized  Result Value Ref Range Status   Specimen Description URINE, CATHETERIZED  Final   Special Requests NONE  Final   Culture   Final    NO GROWTH Performed at [facility], [address], [place], [place] [address]    Report Status 02/18/2022 FINAL  Final  Culture, blood (Routine X 2) w Reflex to ID Panel     Status: None   Collection Time: 02/18/22 12:30 AM   Specimen: BLOOD RIGHT HAND  Result Value Ref Range Status   Specimen Description BLOOD RIGHT HAND  Final   Special Requests   Final    BOTTLES DRAWN AEROBIC AND ANAEROBIC Blood Culture results may not be optimal due to an inadequate volume of blood received in culture bottles   Culture   Final    NO GROWTH 5 DAYS Performed at [facility], [place] [address], [place], [place] [address]    Report Status 02/23/2022 FINAL  Final  Culture, blood (Routine X 2) w Reflex to ID Panel  Status: None   Collection Time: 02/18/22 12:54 AM   Specimen: BLOOD LEFT HAND  Result Value Ref Range Status   Specimen Description BLOOD LEFT HAND  Final   Special Requests   Final    BOTTLES DRAWN AEROBIC AND ANAEROBIC Blood Culture results may not be optimal due to an inadequate volume of blood received in culture bottles   Culture   Final    NO GROWTH 5 DAYS Performed at [facility], [place] [address], [place], [place] [address]    Report Status 02/23/2022 FINAL  Final  C Difficile Quick Screen w PCR reflex     Status: Abnormal   Collection Time: 02/18/22 11:30 AM   Specimen: STOOL  Result Value Ref Range Status   C Diff antigen POSITIVE (A) NEGATIVE Final   C Diff toxin NEGATIVE NEGATIVE Final   C Diff interpretation Results are indeterminate. See PCR results.  Final    Comment: Performed at [facility], [place] [address], [place], [place] [address]  C. Diff by PCR, Reflexed     Status: Abnormal   Collection Time: 02/18/22 11:30 AM  Result Value Ref Range Status   Toxigenic C. Difficile by PCR POSITIVE (A) NEGATIVE Final    Comment: Positive for toxigenic C. difficile with little to no toxin production. Only treat if clinical presentation suggests symptomatic illness. CRITICAL RESULT CALLED TO, READ BACK BY AND VERIFIED WITH: RN Fabio Neighbors [phone] '@[id number]'$  FH Performed at [facility], [address], [place], [place] [address]     Radiology Studies: No results found.    Robertha Staples T. [place]  If 7PM-7AM, please contact night-coverage [email or web address] 02/24/2022, 3:10 PM

## 2022-02-24 NOTE — Progress Notes (Signed)
Pt continues to be significantly agitated.  Pt beginning to have hallucinations; seeing people in the room that aren't there as well as seeing "worms" on the wall.

## 2022-02-25 DIAGNOSIS — R441 Visual hallucinations: Secondary | ICD-10-CM

## 2022-02-25 DIAGNOSIS — R410 Disorientation, unspecified: Secondary | ICD-10-CM

## 2022-02-25 DIAGNOSIS — I9589 Other hypotension: Secondary | ICD-10-CM | POA: Diagnosis not present

## 2022-02-25 DIAGNOSIS — R571 Hypovolemic shock: Secondary | ICD-10-CM | POA: Diagnosis not present

## 2022-02-25 DIAGNOSIS — S0993XA Unspecified injury of face, initial encounter: Secondary | ICD-10-CM | POA: Diagnosis not present

## 2022-02-25 DIAGNOSIS — G9341 Metabolic encephalopathy: Secondary | ICD-10-CM | POA: Diagnosis not present

## 2022-02-25 LAB — CBC
HCT: 33.8 % — ABNORMAL LOW (ref 36.0–46.0)
Hemoglobin: 10.6 g/dL — ABNORMAL LOW (ref 12.0–15.0)
MCH: 30.3 pg (ref 26.0–34.0)
MCHC: 31.4 g/dL (ref 30.0–36.0)
MCV: 96.6 fL (ref 80.0–100.0)
Platelets: 247 10*3/uL (ref 150–400)
RBC: 3.5 MIL/uL — ABNORMAL LOW (ref 3.87–5.11)
RDW: 14.5 % (ref 11.5–15.5)
WBC: 7.6 10*3/uL (ref 4.0–10.5)
nRBC: 0 % (ref 0.0–0.2)

## 2022-02-25 LAB — COMPREHENSIVE METABOLIC PANEL
ALT: 25 U/L (ref 0–44)
AST: 32 U/L (ref 15–41)
Albumin: 2.9 g/dL — ABNORMAL LOW (ref 3.5–5.0)
Alkaline Phosphatase: 104 U/L (ref 38–126)
Anion gap: 11 (ref 5–15)
BUN: 7 mg/dL — ABNORMAL LOW (ref 8–23)
CO2: 20 mmol/L — ABNORMAL LOW (ref 22–32)
Calcium: 8.6 mg/dL — ABNORMAL LOW (ref 8.9–10.3)
Chloride: 110 mmol/L (ref 98–111)
Creatinine, Ser: 1.63 mg/dL — ABNORMAL HIGH (ref 0.44–1.00)
GFR, Estimated: 34 mL/min — ABNORMAL LOW (ref >60)
Glucose, Bld: 96 mg/dL (ref 70–99)
Potassium: 3.8 mmol/L (ref 3.5–5.1)
Sodium: 141 mmol/L (ref 135–145)
Total Bilirubin: 0.6 mg/dL (ref 0.3–1.2)
Total Protein: 6.7 g/dL (ref 6.5–8.1)

## 2022-02-25 LAB — PHOSPHORUS: Phosphorus: 3.3 mg/dL (ref 2.5–4.6)

## 2022-02-25 LAB — GLUCOSE, CAPILLARY
Glucose-Capillary: 89 mg/dL (ref 70–99)
Glucose-Capillary: 86 mg/dL (ref 70–99)
Glucose-Capillary: 128 mg/dL — ABNORMAL HIGH (ref 70–99)
Glucose-Capillary: 77 mg/dL (ref 70–99)

## 2022-02-25 LAB — MAGNESIUM: Magnesium: 2.1 mg/dL (ref 1.7–2.4)

## 2022-02-25 MED ORDER — DIVALPROEX SODIUM 250 MG PO DR TAB
250.0000 mg | DELAYED_RELEASE_TABLET | Freq: Two times a day (BID) | ORAL | Status: DC
  Administered 2022-02-25 – 2022-02-27 (×4): 250 mg via ORAL
  Filled 2022-02-25 (×5): qty 1

## 2022-02-25 MED ORDER — SERTRALINE HCL 100 MG PO TABS
100.0000 mg | ORAL_TABLET | Freq: Every day | ORAL | Status: DC
  Administered 2022-02-26 – 2022-03-04 (×7): 100 mg via ORAL
  Filled 2022-02-25 (×7): qty 1

## 2022-02-25 MED ORDER — CHLORDIAZEPOXIDE HCL 25 MG PO CAPS
25.0000 mg | ORAL_CAPSULE | Freq: Every day | ORAL | Status: AC
  Administered 2022-02-26 – 2022-02-27 (×2): 25 mg via ORAL
  Filled 2022-02-25 (×2): qty 1

## 2022-02-25 NOTE — Progress Notes (Signed)
Physical Therapy Treatment Patient Details Name: [name] MRN: [id number] DOB: [birth year] Today's Date: 02/25/2022   History of Present Illness 71 y.o. female presents to [facility] hospital on 02/17/2022 s/p fall, striking head on dresser. Pt underwent laceration repair on 6/6. 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    PT Comments    Patient limited by confusion throughout session. Patient following <10% of commands and was emotionally labile during session. She was hallucinating throughout stating "there are bugs. Big ones.". Difficult to reorient. Required maxA to come to sitting EOB and required min-maxA to maintain sitting balance. Unable to attempt standing this date due to emotionally labile and returning to supine without warning. Will keep as no PT follow up due to anticipation that once CIWA clears, patient will progress quickly. If cognition does not improve in the next session, may consider SNF placement for continued recovery.     Recommendations for follow up therapy are one component of a multi-disciplinary discharge planning process, led by the attending physician.  Recommendations may be updated based on patient status, additional functional criteria and insurance authorization.  Follow Up Recommendations  No PT follow up     Assistance Recommended at Discharge PRN  Patient can return home with the following Assistance with cooking/housework;Assist for transportation;Help with stairs or ramp for entrance   Equipment Recommendations  Rolling Talise Sligh (2 wheels)    Recommendations for Other Services       Precautions / Restrictions Precautions Precautions: Fall Precaution Comments: CIWA - bilat wrist restraints, bilat mitts, belt restrait. flexi & foley Restrictions Weight Bearing Restrictions: No     Mobility  Bed Mobility Overal bed mobility: Needs Assistance Bed Mobility:  Supine to Sit, Sit to Supine     Supine to sit: Max assist Sit to supine: Max assist        Transfers                   General transfer comment: patient tearful sitting EOB and not willing to participate in standing and returning to supine    Ambulation/Gait                   Stairs             Wheelchair Mobility    Modified Rankin (Stroke Patients Only)       Balance Overall balance assessment: Needs assistance Sitting-balance support: Feet supported Sitting balance-Leahy Scale: Poor Sitting balance - Comments: min-maxA to mainatin sitting balance with no UE support                                    Cognition Arousal/Alertness: Lethargic Behavior During Therapy: Flat affect, Impulsive Overall Cognitive Status: Impaired/Different from baseline Area of Impairment: Problem solving, Safety/judgement, Awareness, Memory, Following commands, Attention, Orientation                 Orientation Level: Disoriented to, Place, Time, Situation Current Attention Level: Focused Memory: Decreased recall of precautions, Decreased short-term memory Following Commands: Follows one step commands inconsistently, Follows one step commands with increased time Safety/Judgement: Decreased awareness of deficits Awareness: Intellectual Problem Solving: Slow processing, Decreased initiation, Difficulty sequencing, Requires verbal cues, Requires tactile cues General Comments: hallucinating and yelling out throughout session. "I see bugs everywhere. Big ones." Attempted to reorient but unsuccessful. Following 10% of commands  Exercises      General Comments General comments (skin integrity, edema, etc.): VSS on RA, NT present at the beginning of the session      Pertinent Vitals/Pain Pain Assessment Pain Assessment: Faces Faces Pain Scale: Hurts little more Pain Location: generalized with movement Pain Descriptors / Indicators:  Guarding Pain Intervention(s): Limited activity within patient's tolerance, Monitored during session, Repositioned    Home Living                          Prior Function            PT Goals (current goals can now be found in the care plan section) Acute Rehab PT Goals PT Goal Formulation: With patient Time For Goal Achievement: 03/04/22 Potential to Achieve Goals: Good Progress towards PT goals: Not progressing toward goals - comment    Frequency    Min 3X/week      PT Plan Current plan remains appropriate    Co-evaluation              AM-PAC PT "6 Clicks" Mobility   Outcome Measure  Help needed turning from your back to your side while in a flat bed without using bedrails?: A Lot Help needed moving from lying on your back to sitting on the side of a flat bed without using bedrails?: A Lot Help needed moving to and from a bed to a chair (including a wheelchair)?: A Lot Help needed standing up from a chair using your arms (e.g., wheelchair or bedside chair)?: Total Help needed to walk in hospital room?: Total Help needed climbing 3-5 steps with a railing? : Total 6 Click Score: 9    End of Session   Activity Tolerance: Other (comment) (Limited by confusion and emotional lability) Patient left: in bed;with call bell/phone within reach;with bed alarm set;with restraints reapplied Nurse Communication: Mobility status;Other (comment) (hallucinations) PT Visit Diagnosis: Unsteadiness on feet (R26.81);Other abnormalities of gait and mobility (R26.89);History of falling (Z91.81)     Time: 6712-4580 PT Time Calculation (min) (ACUTE ONLY): 18 min  Charges:  $Therapeutic Activity: 8-22 mins                     Sherese Heyward A. Gilford Rile PT, DPT Acute Rehabilitation Services Office [phone]    Linna Hoff 02/25/2022, 11:37 AM

## 2022-02-25 NOTE — Progress Notes (Signed)
Occupational Therapy Treatment Patient Details Name: [name] MRN: [id number] DOB: 05/06/1951 Today's Date: 02/25/2022   History of present illness 71 y.o. female presents to [facility] hospital on 02/17/2022 s/p fall, striking head on dresser. Pt underwent laceration repair on 6/6. 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   OT comments  [name] continues to be limited by lethargy, confusion and new possible hallucinations. She required max A to roll and total A for ADLs at bed level, pt not following commands to participate in bathing or dressing. Once EOB pt continues to need min-max A for sitting balance. Attempted self feeding, but pt limited by hallucinations. Max A for 1x sit<>stand, pt then lethargic with eyes closed therefore returned to supine in the bed. OT to continue to follow to re-assess d/c needs with anticipation once she is detoxed, she will be able to return home.    Recommendations for follow up therapy are one component of a multi-disciplinary discharge planning process, led by the attending physician.  Recommendations may be updated based on patient status, additional functional criteria and insurance authorization.    Follow Up Recommendations  No OT follow up    Assistance Recommended at Discharge Intermittent Supervision/Assistance  Patient can return home with the following  A little help with walking and/or transfers;A little help with bathing/dressing/bathroom;Assistance with cooking/housework;Assist for transportation;Help with stairs or ramp for entrance   Equipment Recommendations  None recommended by OT    Recommendations for Other Services      Precautions / Restrictions Precautions Precautions: Fall Precaution Comments: CIWA - bilat wrist restraints, bilat mitts, belt restrait. flexi & foley Restrictions Weight Bearing Restrictions: No       Mobility Bed  Mobility Overal bed mobility: Needs Assistance Bed Mobility: Supine to Sit, Sit to Supine Rolling: Max assist   Supine to sit: Max assist Sit to supine: Max assist        Transfers Overall transfer level: Needs assistance Equipment used: 1 person hand held assist Transfers: Sit to/from Stand Sit to Stand: Max assist           General transfer comment: 1 sit<>stand this session with max A, LOA did not increase with standing therefore pt returned to supine     Balance Overall balance assessment: Needs assistance Sitting-balance support: Feet supported Sitting balance-Leahy Scale: Poor Sitting balance - Comments: min-max A for sitting balance   Standing balance support: Bilateral upper extremity supported Standing balance-Leahy Scale: Zero                             ADL either performed or assessed with clinical judgement   ADL Overall ADL's : Needs assistance/impaired Eating/Feeding: Maximal assistance;Sitting Eating/Feeding Details (indicate cue type and reason): attempted to self feed, pt pretending to "drink" out of her empty hand. max A required to complete activity         Lower Body Bathing: Total assistance;Bed level                       Functional mobility during ADLs: Maximal assistance General ADL Comments: assisted NT with bathing at the beginning of the session, pt not following commands to assist. limited to EOB and standing 1x this session. Pt more confused and not following commands this session    Extremity/Trunk Assessment Upper Extremity Assessment Upper Extremity Assessment: Generalized weakness   Lower Extremity  Assessment Lower Extremity Assessment: Generalized weakness        Vision   Vision Assessment?: No apparent visual deficits Additional Comments: L eye covered by gauze   Perception Perception Perception: Not tested   Praxis Praxis Praxis: Not tested    Cognition Arousal/Alertness: Lethargic Behavior  During Therapy: Flat affect, Impulsive Overall Cognitive Status: Impaired/Different from baseline Area of Impairment: Problem solving, Safety/judgement, Awareness, Memory, Following commands, Attention, Orientation                 Orientation Level: Time, Situation, Place Current Attention Level: Selective Memory: Decreased recall of precautions, Decreased short-term memory Following Commands: Follows one step commands inconsistently, Follows one step commands with increased time Safety/Judgement: Decreased awareness of deficits Awareness: Intellectual Problem Solving: Slow processing, Decreased initiation, Difficulty sequencing, Requires verbal cues, Requires tactile cues General Comments: more lethargic and confused today, seemingly hallucinating        Exercises      Shoulder Instructions       General Comments VSS on RA, NT present at the beginning of the session    Pertinent Vitals/ Pain       Pain Assessment Pain Assessment: Faces Faces Pain Scale: Hurts a little bit Pain Location: generalized with movement Pain Descriptors / Indicators: Guarding Pain Intervention(s): Limited activity within patient's tolerance, Monitored during session   Frequency  Min 2X/week        Progress Toward Goals  OT Goals(current goals can now be found in the care plan section)  Progress towards OT goals: Not progressing toward goals - comment  Acute Rehab OT Goals Patient Stated Goal: unable to state OT Goal Formulation: With patient Time For Goal Achievement: 03/04/22 Potential to Achieve Goals: Good ADL Goals Pt Will Perform Upper Body Dressing: Independently;sitting Pt Will Perform Lower Body Dressing: Independently;sit to/from stand Pt Will Transfer to Toilet: Independently;ambulating Additional ADL Goal #1: Pt will independently complete IADL medication managment task Additional ADL Goal #2: Pt will tolerate at least 8 minutes of OOB standing functional activity   Plan Discharge plan remains appropriate       AM-PAC OT "6 Clicks" Daily Activity     Outcome Measure   Help from another person eating meals?: A Lot Help from another person taking care of personal grooming?: A Lot Help from another person toileting, which includes using toliet, bedpan, or urinal?: A Lot Help from another person bathing (including washing, rinsing, drying)?: A Lot Help from another person to put on and taking off regular upper body clothing?: A Lot Help from another person to put on and taking off regular lower body clothing?: A Lot 6 Click Score: 12    End of Session    OT Visit Diagnosis: Unsteadiness on feet (R26.81);History of falling (Z91.81)   Activity Tolerance Patient tolerated treatment well;Patient limited by lethargy   Patient Left in bed;with call bell/phone within reach;with bed alarm set;with nursing/sitter in room   Nurse Communication Mobility status        Time: 0840-0900 OT Time Calculation (min): 20 min  Charges: OT General Charges $OT Visit: 1 Visit OT Treatments $Therapeutic Activity: 8-22 mins    Jairo Bellew A Aysha Livecchi 02/25/2022, 9:42 AM

## 2022-02-25 NOTE — Progress Notes (Signed)
PROGRESS NOTE  [name] VVO:[id number] DOB: March 03, 1951   PCP: Celene Squibb, MD  Patient is from: Home  DOA: 02/17/2022 LOS: 8  Chief complaints Chief Complaint  Patient presents with   Fall     Brief Narrative / Interim history: 71 year old F with PMH of EtOH abuse, combined CHF s/p BIV PPM, CVA, DM-2, CKD-3B, neurogenic bladder with self-catheterization, chronic pain, GERD, anxiety, insomnia and depression presenting to [facility] after a fall at home the night of 6/5-6 hitting her head against a dresser while wearing her glasses.  Denies LOC.  Reportedly, "bled everywhere and cut the flap of skin off at home".  In ED, she was hypotensive to 74/44. Cr 5.6.  BUN 67.  Lactate 2.1.  Head CT negative for acute finding.  She was started on regular IV fluid and vasopressors and admitted to ICU for hypovolemic shock, AKI and acute blood loss anemia.  Patient had laceration repair by plastic surgery.  She came off vasopressors.  She is transferred to hospitalist service on 6/9.  She is currently undergoing alcohol withdrawal.  On phenobarbital taper, Librium, Depakote and as needed Ativan.  Also requiring restraints.  Subjective: Seen and examined earlier this morning.  Patient was agitated and hallucinating.  She states she is seeing bugs crawling on the wall.  She is only oriented to self, city and state.  Does not know she is in the hospital.  She follows commands.  Objective: Vitals:   02/25/22 0330 02/25/22 0335 02/25/22 0800 02/25/22 1150  BP: (!) 146/49  137/68 (!) 148/67  Pulse: (!) 105  (!) 105 100  Resp: 16  17 (!) 24  Temp: 98.5 F (36.9 C)  98.6 F (37 C) 98.9 F (37.2 C)  TempSrc: Oral  Oral Oral  SpO2: 98%  100% 94%  Weight:  60.2 kg    Height:        Examination:  GENERAL: No apparent distress.  Nontoxic. HEENT: MMM.  Vision and hearing grossly intact.  NECK: Supple.  No apparent JVD.  RESP:  No IWOB.  Fair aeration bilaterally. CVS:  RRR. Heart sounds normal.   ABD/GI/GU: BS+. Abd soft, NTND.  Rectal tube.  Indwelling Foley.  Abdominal restraints. MSK/EXT:  Moves extremities. No apparent deformity. No edema.  Bilateral wrist restraints. SKIN: Dressing over left face and left thigh DCI.  Stitch looks clean. NEURO: Awake and alert. Oriented to self, city and state.  Follows commands.  No apparent focal neuro deficit. PSYCH: Visual hallucination.  Procedures:  6/8-complex left facial laceration repair by plastic surgery  Microbiology summarized: COVID-19 and influenza PCR nonreactive. MRSA PCR screen negative. Blood and urine cultures NGTD GIP negative. C. difficile PCR screen positive.  Assessment and Plan: Alcohol withdrawal syndrome/Acute toxic and metabolic encephalopathy/visual hallucination: Remains confused and agitated at times.  Now having visual hallucination.  She should be outside withdrawal window for alcohol.  Hospital delirium? -Completed high-dose thiamine -Completing phenobarbital taper. -Weaning of Librium. -Will stop Depakote once off phenobarbital taper.   -Continue Ativan as needed -Would avoid antipsychotics given prolonged QTc -Check ammonia since patient is on Depakote -Decrease home Zoloft to 100 mg daily -Continue soft restraints due to risk for fall and serious injury. -Reorientation and delirium precautions. -Continue soft restraints given risk for fall and serious injury. -PCCM signed off.  Facial trauma due to fall at home In the setting of fall.  CT head without acute finding. -S/p complex laceration repair on 6/8 by plastic surgery. -Bactroban to  cheek, erythromycin oph oint to left eye and KY gel to lateral nose daily -Outpatient follow-up with plastic surgery in 10 days -Fall precaution -PT/OT eval  Hypovolemic shock ([place]) Likely hypovolemic in the setting of dehydration, alcohol abuse and acute blood loss.  Resolved.  TTE with LVEF of 55 to 60%, G1-DD, normal RV/PASP and no WMA.  -IV fluid due to  poor p.o. intake in the setting of encephalopathy and alcohol withdrawal.  AKI on CKD-3A: B/l Cr 1.3-1.5.  Resolving. Recent Labs    02/17/22 0756 02/17/22 0808 02/18/22 0031 02/19/22 0355 02/20/22 1125 02/21/22 0315 02/22/22 0314 02/23/22 0635 02/24/22 0237 02/25/22 0152  BUN 67* 66* 58* 47* 27* '23 15 9 10 '$ 7*  CREATININE 5.58* 6.40* 4.88* 3.44* 2.44* 2.07* 1.86* 1.70* 1.74* 1.63*  -Continue IV fluid -Avoid nephrotoxins.  ABLA superimposed on anemia of chronic disease: H&H stable.  Anemia panel without significant finding. Recent Labs    02/17/22 0808 02/17/22 1524 02/18/22 0031 02/19/22 0355 02/20/22 1125 02/21/22 0315 02/22/22 0314 02/23/22 0635 02/24/22 0237 02/25/22 0152  HGB 11.2* 9.5* 8.9* 9.5* 9.0* 8.7* 9.1* 10.0* 9.2* 10.6*  -Monitor H&H.  Clostridioides difficile infection: C. difficile toxin is negative but antigen and PCR positive. She was on ceftriaxone for possible UTI.  Diarrhea seems to have subsided. -Ceftriaxone discontinued.  -Continue p.o. 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  No cardiopulmonary symptoms.  Appears euvolemic on exam.  On p.o. Lasix 40 mg daily at home. -Continue holding diuretics in the setting of AKI -Monitor intake and output, daily weight, renal functions and electrolytes -Continue Coreg. -Continue holding losartan  Uncontrolled IDDM-2 with hyperglycemia and CKD-3B: A1c 7.3%. Recent Labs  Lab 02/24/22 1112 02/24/22 1535 02/24/22 1952 02/25/22 0800 02/25/22 1146  GLUCAP 133* 92 109* 89 86  -Continue holding basal insulin. -Continue SSI-sensitive  Urinary tract infection: Patient with neurogenic bladder and intermittent self-catheterization.  She is at risk for UTI.  UA with bacteria and pyuria.  Surprisingly urine culture is negative.  -Discontinued ceftriaxone given C.  difficile.  Seems she already had 4 doses  Metabolic acidosis -P.o. sodium bicarbonate 650 mg 3 times daily  Hypokalemia/hypophosphatemia/hypomagnesemia:  -Replenish as appropriate.  Thrombocytopenia ([place]): Resolved.  Essential hypertension, benign: BP slightly elevated -Continue p.o. Coreg 6.25 mg twice daily.  Chronic lower back pain -Continue Tylenol and oxycodone as needed  History of CVA: No focal neuro symptoms but limited exam.  CT head without acute finding. -Continue home medications when able to take p.o.  Anxiety and depression: Currently undergoing alcohol withdrawal. -Ativan as above -Decrease Zoloft.  Lactic acidosis: Resolved.  Rhabdomyolysis: Resolved.  DVT prophylaxis:  heparin injection 5,000 Units Start: 02/18/22 2200 SCDs Start: 02/17/22 1249 Place TED hose Start: 02/17/22 1249  Code Status: Full code Family Communication: None at bedside today. Level of care: Progressive Status is: Inpatient Remains inpatient appropriate because: Encephalopathy, hallucination and possible delirium   Final disposition: TBD Consultants:  Pulmonology-signed off Plastic surgery-signed off   Sch Meds:  Scheduled Meds:  buPROPion  300 mg Oral Daily   carvedilol  6.25 mg Oral BID WC   chlordiazePOXIDE  25 mg Oral BID   Chlorhexidine Gluconate Cloth  6 each Topical Daily   folic acid  1 mg Oral Daily   Gerhardt's butt cream   Topical BID   heparin  5,000  Units Subcutaneous Q8H   insulin aspart  0-5 Units Subcutaneous QHS   insulin aspart  0-9 Units Subcutaneous TID WC   multivitamin with minerals  1 tablet Oral Daily   pantoprazole  40 mg Oral Daily   phenobarbital  32.4 mg Oral Q8H   rosuvastatin  20 mg Oral Daily   sertraline  200 mg Oral Daily   sodium bicarbonate  650 mg Oral TID   thiamine  100 mg Oral Daily   vancomycin  125 mg Oral QID   Continuous Infusions:  sodium chloride Stopped (02/17/22 1857)   valproate sodium 250 mg (02/25/22 1150)    PRN Meds:.acetaminophen **OR** acetaminophen, bisacodyl, LORazepam, ondansetron **OR** ondansetron (ZOFRAN) IV, oxyCODONE  Antimicrobials: Anti-infectives (From admission, onward)    Start     Dose/Rate Route Frequency Ordered Stop   02/20/22 1415  vancomycin (VANCOCIN) capsule 125 mg        125 mg Oral 4 times daily 02/20/22 1321 03/02/22 1359   02/18/22 0600  ceFAZolin (ANCEF) IVPB 2g/100 mL premix  Status:  Discontinued        2 g 200 mL/hr over 30 Minutes Intravenous On call to O.R. 02/17/22 1835 02/17/22 1955   02/17/22 1830  cefTRIAXone (ROCEPHIN) 2 g in sodium chloride 0.9 % 100 mL IVPB  Status:  Discontinued        2 g 200 mL/hr over 30 Minutes Intravenous Every 24 hours 02/17/22 1828 02/20/22 1316   02/17/22 1708  ceFAZolin 1 g / gentamicin 80 mg in NS 500 mL surgical irrigation  Status:  Discontinued          As needed 02/17/22 1709 02/17/22 1824   02/17/22 0800  ceFAZolin (ANCEF) IVPB 2g/100 mL premix        2 g 200 mL/hr over 30 Minutes Intravenous  Once 02/17/22 0757 02/17/22 0851        I have personally reviewed the following labs and images: CBC: Recent Labs  Lab 02/21/22 0315 02/22/22 0314 02/23/22 0635 02/24/22 0237 02/25/22 0152  WBC 4.6 5.8 8.4 7.9 7.6  HGB 8.7* 9.1* 10.0* 9.2* 10.6*  HCT 25.4* 27.9* 30.0* 28.9* 33.8*  MCV 93.0 93.9 93.5 95.7 96.6  PLT 135* 185 230 250 247   BMP &GFR Recent Labs  Lab 02/21/22 0315 02/22/22 0314 02/23/22 0635 02/24/22 0237 02/25/22 0152  NA 140 139 140 139 141  K 3.4* 3.3* 3.9 3.7 3.8  CL 110 108 108 108 110  CO2 21* 20* 20* 18* 20*  GLUCOSE 140* 73 109* 115* 96  BUN '23 15 9 10 '$ 7*  CREATININE 2.07* 1.86* 1.70* 1.74* 1.63*  CALCIUM 8.2* 8.3* 8.5* 8.4* 8.6*  MG 1.8 1.7 1.8 1.9 2.1  PHOS 2.3* 1.9* 4.3 4.4 3.3   Estimated Creatinine Clearance: 27.2 mL/min (A) (by C-G formula based on SCr of 1.63 mg/dL (H)). Liver & Pancreas: Recent Labs  Lab 02/19/22 0355 02/20/22 1125 02/21/22 0315 02/22/22 0314  02/23/22 0635 02/24/22 0237 02/25/22 0152  AST 95* 57* 74* 42*  --   --  32  ALT 29 24 38 31  --   --  25  ALKPHOS 165* 134* 115 106  --   --  104  BILITOT 0.5 0.5 0.5 0.6  --   --  0.6  PROT 6.3* 6.0* 5.4* 5.6*  --   --  6.7  ALBUMIN 2.9* 2.6* 2.3* 2.6* 2.7* 2.6* 2.9*   No results for input(s): "LIPASE", "AMYLASE" in the last 168 hours. Recent  Labs  Lab 02/20/22 1125  AMMONIA 28   Diabetic: No results for input(s): "HGBA1C" in the last 72 hours.  Recent Labs  Lab 02/24/22 1112 02/24/22 1535 02/24/22 1952 02/25/22 0800 02/25/22 1146  GLUCAP 133* 92 109* 89 86   Cardiac Enzymes: Recent Labs  Lab 02/20/22 1125 02/21/22 0315 02/22/22 0314 02/23/22 0635  CKTOTAL 163 101 170 178   No results for input(s): "PROBNP" in the last 8760 hours. Coagulation Profile: No results for input(s): "INR", "PROTIME" in the last 168 hours.  Thyroid Function Tests: No results for input(s): "TSH", "T4TOTAL", "FREET4", "T3FREE", "THYROIDAB" in the last 72 hours. Lipid Profile: No results for input(s): "CHOL", "HDL", "LDLCALC", "TRIG", "CHOLHDL", "LDLDIRECT" in the last 72 hours. Anemia Panel: No results for input(s): "VITAMINB12", "FOLATE", "FERRITIN", "TIBC", "IRON", "RETICCTPCT" in the last 72 hours.  Urine analysis:    Component Value Date/Time   COLORURINE YELLOW 02/17/2022 0756   APPEARANCEUR HAZY (A) 02/17/2022 0756   APPEARANCEUR Clear 02/05/2016 1300   LABSPEC 1.012 02/17/2022 0756   PHURINE 5.0 02/17/2022 0756   GLUCOSEU NEGATIVE 02/17/2022 0756   HGBUR MODERATE (A) 02/17/2022 0756   BILIRUBINUR NEGATIVE 02/17/2022 0756   BILIRUBINUR negative 09/20/2019 1545   BILIRUBINUR Negative 02/05/2016 1300   KETONESUR NEGATIVE 02/17/2022 0756   PROTEINUR 100 (A) 02/17/2022 0756   UROBILINOGEN 0.2 09/20/2019 1545   UROBILINOGEN 0.2 08/13/2014 1332   NITRITE NEGATIVE 02/17/2022 0756   LEUKOCYTESUR LARGE (A) 02/17/2022 0756   Sepsis Labs: Invalid input(s): "PROCALCITONIN",  "LACTICIDVEN"  Microbiology: Recent Results (from the past 240 hour(s))  Resp Panel by RT-PCR (Flu A&B, Covid) Anterior Nasal Swab     Status: None   Collection Time: 02/17/22  7:56 AM   Specimen: Anterior Nasal Swab  Result Value Ref Range Status   SARS Coronavirus 2 by RT PCR NEGATIVE NEGATIVE Final    Comment: (NOTE) SARS-CoV-2 target nucleic acids are NOT DETECTED.  The SARS-CoV-2 RNA is generally detectable in upper respiratory specimens during the acute phase of infection. The lowest concentration of SARS-CoV-2 viral copies this assay can detect is 138 copies/mL. A negative result does not preclude SARS-Cov-2 infection and should not be used as the sole basis for treatment or other patient management decisions. A negative result may occur with  improper specimen collection/handling, submission of specimen other than nasopharyngeal swab, presence of viral mutation(s) within the areas targeted by this assay, and inadequate number of viral copies(<138 copies/mL). A negative result must be combined with clinical observations, patient history, and epidemiological information. The expected result is Negative.  Fact Sheet for Patients:  EntrepreneurPulse.com.au  Fact Sheet for Healthcare Providers:  IncredibleEmployment.be  This test is no t yet approved or cleared by the [place] FDA and  has been authorized for detection and/or diagnosis of SARS-CoV-2 by FDA under an Emergency Use Authorization (EUA). This EUA will remain  in effect (meaning this test can be used) for the duration of the COVID-19 declaration under Section 564(b)(1) of the Act, 21 U.S.C.section 360bbb-3(b)(1), unless the authorization is terminated  or revoked sooner.       Influenza A by PCR NEGATIVE NEGATIVE Final   Influenza B by PCR NEGATIVE NEGATIVE Final    Comment: (NOTE) The Xpert Xpress SARS-CoV-2/FLU/RSV plus assay is intended as an aid in the diagnosis of  influenza from Nasopharyngeal swab specimens and should not be used as a sole basis for treatment. Nasal washings and aspirates are unacceptable for Xpert Xpress SARS-CoV-2/FLU/RSV testing.  Fact Sheet for Patients:  EntrepreneurPulse.com.au  Fact Sheet for Healthcare Providers: IncredibleEmployment.be  This test is not yet approved or cleared by the [place] FDA and has been authorized for detection and/or diagnosis of SARS-CoV-2 by FDA under an Emergency Use Authorization (EUA). This EUA will remain in effect (meaning this test can be used) for the duration of the COVID-19 declaration under Section 564(b)(1) of the Act, 21 U.S.C. section 360bbb-3(b)(1), unless the authorization is terminated or revoked.  Performed at [facility], [address], [place], [place] [address]   Surgical pcr screen     Status: None   Collection Time: 02/17/22  4:07 PM   Specimen: Nasal Mucosa; Nasal Swab  Result Value Ref Range Status   MRSA, PCR NEGATIVE NEGATIVE Final   Staphylococcus aureus NEGATIVE NEGATIVE Final    Comment: (NOTE) The Xpert SA Assay (FDA approved for NASAL specimens in patients 43 years of age and older), is one component of a comprehensive surveillance program. It is not intended to diagnose infection nor to guide or monitor treatment. Performed at [facility], [place] [address] [place], [place] [address]   Gastrointestinal Panel by PCR , Stool     Status: None   Collection Time: 02/17/22  6:50 PM   Specimen: Stool  Result Value Ref Range Status   Campylobacter species NOT DETECTED NOT DETECTED Final   Plesimonas shigelloides NOT DETECTED NOT DETECTED Final   Salmonella species NOT DETECTED NOT DETECTED Final   Yersinia enterocolitica NOT DETECTED NOT DETECTED Final   Vibrio species NOT DETECTED NOT DETECTED Final   Vibrio cholerae NOT DETECTED NOT DETECTED Final   Enteroaggregative E coli (EAEC) NOT DETECTED NOT DETECTED  Final   Enteropathogenic E coli (EPEC) NOT DETECTED NOT DETECTED Final   Enterotoxigenic E coli (ETEC) NOT DETECTED NOT DETECTED Final   Shiga like toxin producing E coli (STEC) NOT DETECTED NOT DETECTED Final   Shigella/Enteroinvasive E coli (EIEC) NOT DETECTED NOT DETECTED Final   Cryptosporidium NOT DETECTED NOT DETECTED Final   Cyclospora cayetanensis NOT DETECTED NOT DETECTED Final   Entamoeba histolytica NOT DETECTED NOT DETECTED Final   Giardia lamblia NOT DETECTED NOT DETECTED Final   Adenovirus F40/41 NOT DETECTED NOT DETECTED Final   Astrovirus NOT DETECTED NOT DETECTED Final   Norovirus GI/GII NOT DETECTED NOT DETECTED Final   Rotavirus A NOT DETECTED NOT DETECTED Final   Sapovirus (I, II, IV, and V) NOT DETECTED NOT DETECTED Final    Comment: Performed at [facility], [address], [place], [place] [address]  Urine Culture     Status: None   Collection Time: 02/17/22 10:44 PM   Specimen: Urine, Catheterized  Result Value Ref Range Status   Specimen Description URINE, CATHETERIZED  Final   Special Requests NONE  Final   Culture   Final    NO GROWTH Performed at [facility], [address], [place], [place] [address]    Report Status 02/18/2022 FINAL  Final  Culture, blood (Routine X 2) w Reflex to ID Panel     Status: None   Collection Time: 02/18/22 12:30 AM   Specimen: BLOOD RIGHT HAND  Result Value Ref Range Status   Specimen Description BLOOD RIGHT HAND  Final   Special Requests   Final    BOTTLES DRAWN AEROBIC AND ANAEROBIC Blood Culture results may not be optimal due to an inadequate volume of blood received in culture bottles   Culture   Final    NO GROWTH 5 DAYS  Performed at [facility], [place] [address], [place], [place] [address]    Report Status 02/23/2022 FINAL  Final  Culture, blood (Routine X 2) w Reflex to ID Panel     Status: None   Collection Time: 02/18/22 12:54 AM   Specimen: BLOOD LEFT HAND  Result Value Ref Range Status    Specimen Description BLOOD LEFT HAND  Final   Special Requests   Final    BOTTLES DRAWN AEROBIC AND ANAEROBIC Blood Culture results may not be optimal due to an inadequate volume of blood received in culture bottles   Culture   Final    NO GROWTH 5 DAYS Performed at [facility], [place] [address] [place], [place] [address]    Report Status 02/23/2022 FINAL  Final  C Difficile Quick Screen w PCR reflex     Status: Abnormal   Collection Time: 02/18/22 11:30 AM   Specimen: STOOL  Result Value Ref Range Status   C Diff antigen POSITIVE (A) NEGATIVE Final   C Diff toxin NEGATIVE NEGATIVE Final   C Diff interpretation Results are indeterminate. See PCR results.  Final    Comment: Performed at [facility], [place] [address] [place], [place] [address]  C. Diff by PCR, Reflexed     Status: Abnormal   Collection Time: 02/18/22 11:30 AM  Result Value Ref Range Status   Toxigenic C. Difficile by PCR POSITIVE (A) NEGATIVE Final    Comment: Positive for toxigenic C. difficile with little to no toxin production. Only treat if clinical presentation suggests symptomatic illness. CRITICAL RESULT CALLED TO, READ BACK BY AND VERIFIED WITH: RN Fabio Neighbors [phone] '@[phone]'$  FH Performed at [facility], [address] [place], [place] [address]     Radiology Studies: No results found.    Jatinder Mcdonagh T. [place]  If 7PM-7AM, please contact night-coverage [email or web address] 02/25/2022, 1:03 PM

## 2022-02-25 NOTE — Plan of Care (Signed)
  Problem: Education: Goal: Ability to describe self-care measures that may prevent or decrease complications (Diabetes Survival Skills Education) will improve Outcome: Progressing Goal: Individualized Educational Video(s) Outcome: Progressing   Problem: Coping: Goal: Ability to adjust to condition or change in health will improve Outcome: Progressing   Problem: Fluid Volume: Goal: Ability to maintain a balanced intake and output will improve Outcome: Progressing   Problem: Health Behavior/Discharge Planning: Goal: Ability to identify and utilize available resources and services will improve Outcome: Progressing Goal: Ability to manage health-related needs will improve Outcome: Progressing   Problem: Metabolic: Goal: Ability to maintain appropriate glucose levels will improve Outcome: Progressing   Problem: Nutritional: Goal: Maintenance of adequate nutrition will improve Outcome: Progressing Goal: Progress toward achieving an optimal weight will improve Outcome: Progressing   Problem: Skin Integrity: Goal: Risk for impaired skin integrity will decrease Outcome: Progressing   Problem: Tissue Perfusion: Goal: Adequacy of tissue perfusion will improve Outcome: Progressing   Problem: Safety: Goal: Non-violent Restraint(s) Outcome: Progressing   Problem: Education: Goal: Knowledge of General Education information will improve Description: Including pain rating scale, medication(s)/side effects and non-pharmacologic comfort measures Outcome: Progressing   Problem: Health Behavior/Discharge Planning: Goal: Ability to manage health-related needs will improve Outcome: Progressing   Problem: Clinical Measurements: Goal: Ability to maintain clinical measurements within normal limits will improve Outcome: Progressing Goal: Will remain free from infection Outcome: Progressing Goal: Diagnostic test results will improve Outcome: Progressing Goal: Respiratory complications  will improve Outcome: Progressing Goal: Cardiovascular complication will be avoided Outcome: Progressing   Problem: Activity: Goal: Risk for activity intolerance will decrease Outcome: Progressing   Problem: Nutrition: Goal: Adequate nutrition will be maintained Outcome: Progressing   Problem: Coping: Goal: Level of anxiety will decrease Outcome: Progressing   Problem: Elimination: Goal: Will not experience complications related to bowel motility Outcome: Progressing Goal: Will not experience complications related to urinary retention Outcome: Progressing   Problem: Pain Managment: Goal: General experience of comfort will improve Outcome: Progressing   Problem: Safety: Goal: Ability to remain free from injury will improve Outcome: Progressing   Problem: Skin Integrity: Goal: Risk for impaired skin integrity will decrease Outcome: Progressing   

## 2022-02-26 DIAGNOSIS — R571 Hypovolemic shock: Secondary | ICD-10-CM | POA: Diagnosis not present

## 2022-02-26 DIAGNOSIS — G9341 Metabolic encephalopathy: Secondary | ICD-10-CM | POA: Diagnosis not present

## 2022-02-26 DIAGNOSIS — I9589 Other hypotension: Secondary | ICD-10-CM | POA: Diagnosis not present

## 2022-02-26 DIAGNOSIS — S0993XA Unspecified injury of face, initial encounter: Secondary | ICD-10-CM | POA: Diagnosis not present

## 2022-02-26 LAB — RENAL FUNCTION PANEL
Albumin: 2.9 g/dL — ABNORMAL LOW (ref 3.5–5.0)
Anion gap: 11 (ref 5–15)
BUN: 8 mg/dL (ref 8–23)
CO2: 21 mmol/L — ABNORMAL LOW (ref 22–32)
Calcium: 8.8 mg/dL — ABNORMAL LOW (ref 8.9–10.3)
Chloride: 107 mmol/L (ref 98–111)
Creatinine, Ser: 1.37 mg/dL — ABNORMAL HIGH (ref 0.44–1.00)
GFR, Estimated: 42 mL/min — ABNORMAL LOW (ref >60)
Glucose, Bld: 109 mg/dL — ABNORMAL HIGH (ref 70–99)
Phosphorus: 3.1 mg/dL (ref 2.5–4.6)
Potassium: 3.8 mmol/L (ref 3.5–5.1)
Sodium: 139 mmol/L (ref 135–145)

## 2022-02-26 LAB — GLUCOSE, CAPILLARY
Glucose-Capillary: 83 mg/dL (ref 70–99)
Glucose-Capillary: 178 mg/dL — ABNORMAL HIGH (ref 70–99)
Glucose-Capillary: 127 mg/dL — ABNORMAL HIGH (ref 70–99)
Glucose-Capillary: 149 mg/dL — ABNORMAL HIGH (ref 70–99)

## 2022-02-26 LAB — AMMONIA: Ammonia: 21 umol/L (ref 9–35)

## 2022-02-26 LAB — MAGNESIUM: Magnesium: 2 mg/dL (ref 1.7–2.4)

## 2022-02-26 MED ORDER — CARVEDILOL 12.5 MG PO TABS
12.5000 mg | ORAL_TABLET | Freq: Two times a day (BID) | ORAL | Status: DC
  Administered 2022-02-26: 12.5 mg via ORAL
  Filled 2022-02-26: qty 1

## 2022-02-26 MED ORDER — LORAZEPAM 2 MG/ML IJ SOLN
1.0000 mg | INTRAMUSCULAR | Status: DC | PRN
  Administered 2022-02-26 – 2022-02-27 (×2): 1 mg via INTRAVENOUS
  Filled 2022-02-26 (×2): qty 1

## 2022-02-26 MED ORDER — CARVEDILOL 25 MG PO TABS
25.0000 mg | ORAL_TABLET | Freq: Two times a day (BID) | ORAL | Status: DC
  Administered 2022-02-26 – 2022-03-04 (×12): 25 mg via ORAL
  Filled 2022-02-26 (×13): qty 1

## 2022-02-26 NOTE — Progress Notes (Signed)
PROGRESS NOTE  [name] OXB:[id number] DOB: 28-Jun-1951   PCP: Celene Squibb, MD  Patient is from: Home  DOA: 02/17/2022 LOS: 45  Chief complaints Chief Complaint  Patient presents with   Fall     Brief Narrative / Interim history: 71 year old F with PMH of EtOH abuse, combined CHF s/p BIV PPM, CVA, DM-2, CKD-3B, neurogenic bladder with self-catheterization, chronic pain, GERD, anxiety, insomnia and depression presenting to [facility] after a fall at home the night of 6/5-6 hitting her head against a dresser while wearing her glasses.  Denies LOC.  Reportedly, "bled everywhere and cut the flap of skin off at home".  In ED, she was hypotensive to 74/44. Cr 5.6.  BUN 67.  Lactate 2.1.  Head CT negative for acute finding.  She was started on regular IV fluid and vasopressors and admitted to ICU for hypovolemic shock, AKI and acute blood loss anemia.  Patient had laceration repair by plastic surgery.  She came off vasopressors.  She is transferred to hospitalist service on 6/9.  She is currently undergoing alcohol withdrawal.  Finishing phenobarbital and Librium taper.  She is also on Depakote.  Weaning of Ativan as well.  Now with visual hallucination.   Subjective: Seen and examined earlier this morning.  No major events overnight of this morning.  She is awake and oriented to self and place but not time.  She is talking to the characters on the TV calling it a bartender.   Objective: Vitals:   02/26/22 0302 02/26/22 0353 02/26/22 0803 02/26/22 1128  BP: (!) 171/86  (!) 164/79 (!) 181/79  Pulse: (!) 102  (!) 105 (!) 105  Resp: '15  16 17  '$ Temp: 98.7 F (37.1 C)  98.1 F (36.7 C) 98.6 F (37 C)  TempSrc: Oral  Oral Oral  SpO2: 95%  93% 98%  Weight:  59.2 kg    Height:        Examination:  GENERAL: No apparent distress.  Nontoxic. HEENT: MMM.  Vision and hearing grossly intact.  NECK: Supple.  No apparent JVD.  RESP:  No IWOB.  Fair aeration bilaterally. CVS:  RRR. Heart sounds  normal.  ABD/GI/GU: BS+. Abd soft, NTND.  MSK/EXT:  Moves extremities. No apparent deformity. No edema.  SKIN: Dressing over left face and left eye DCI. NEURO: Awake and alert. Oriented to self and place.  Follows commands.  No apparent focal neuro deficit. PSYCH: Seems to be having visual hallucination.  Talking to figures on TV.  Procedures:  6/8-complex left facial laceration repair by plastic surgery  Microbiology summarized: COVID-19 and influenza PCR nonreactive. MRSA PCR screen negative. Blood and urine cultures NGTD GIP negative. C. difficile PCR screen positive.  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  Now having visual hallucination.  She should be outside withdrawal window for alcohol.  Hospital delirium? -Completed high-dose thiamine -Completing phenobarbital taper. -Weaning off Librium. -Will stop Depakote once off phenobarbital taper.   -Continue Ativan as needed-reduce dose. -Would avoid antipsychotics given prolonged QTc -Decreased home Zoloft to 100 mg daily -Continue soft restraints due to risk for fall and serious  injury. -Reorientation and delirium precautions.. -PCCM signed off.  Facial trauma due to fall at home In the setting of fall.  CT head without acute finding. -S/p complex laceration repair on 6/8 by plastic surgery. -Bactroban to cheek, erythromycin oph oint to left eye and KY gel to lateral nose daily -Outpatient follow-up with plastic surgery in 10 days -Fall precaution -PT/OT eval  Hypovolemic shock ([place]) Likely hypovolemic in the setting of dehydration, alcohol abuse and acute blood loss.  Resolved.  TTE with LVEF of 55 to 60%, G1-DD, normal RV/PASP and no WMA.  -IV fluid due to poor p.o. intake in the setting of encephalopathy and alcohol withdrawal.  AKI on CKD-3A: B/l Cr 1.3-1.5.  Resolved. Recent Labs    02/17/22 0808 02/18/22 0031 02/19/22 0355 02/20/22 1125 02/21/22 0315 02/22/22 0314 02/23/22 0635 02/24/22 0237 02/25/22 0152 02/26/22 0210  BUN 66* 58* 47* 27* '23 15 9 10 '$ 7* 8  CREATININE 6.40* 4.88* 3.44* 2.44* 2.07* 1.86* 1.70* 1.74* 1.63* 1.37*  -Continue IV fluid -Avoid nephrotoxins.  ABLA superimposed on anemia of chronic disease: H&H stable.  Anemia panel without significant finding. Recent Labs    02/17/22 0808 02/17/22 1524 02/18/22 0031 02/19/22 0355 02/20/22 1125 02/21/22 0315 02/22/22 0314 02/23/22 0635 02/24/22 0237 02/25/22 0152  HGB 11.2* 9.5* 8.9* 9.5* 9.0* 8.7* 9.1* 10.0* 9.2* 10.6*  -Monitor H&H.  Clostridioides difficile infection: C. difficile toxin is negative but antigen and PCR positive. She was on ceftriaxone for possible UTI.  Diarrhea seems to have resolved. -Ceftriaxone discontinued.  -Continue p.o. 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  No cardiopulmonary symptoms.  Appears euvolemic on exam.  On p.o. Lasix 40 mg daily at home. -Continue holding diuretics in the setting of  AKI -Monitor intake and output, daily weight, renal functions and electrolytes -Continue Coreg. -Continue holding losartan  Uncontrolled hypertension.  She also had mild tachycardia. -Increase Coreg to 25 mg twice daily  Uncontrolled IDDM-2 with hyperglycemia and CKD-3B: A1c 7.3%. Recent Labs  Lab 02/25/22 1146 02/25/22 1616 02/25/22 2119 02/26/22 0821 02/26/22 1122  GLUCAP 86 128* 77 83 178*  -Continue holding basal insulin. -Continue SSI-sensitive  Urinary tract infection: Patient with neurogenic bladder and intermittent self-catheterization.  She is at risk for UTI.  UA with bacteria and pyuria.  Surprisingly urine culture is negative.  -Discontinued ceftriaxone given C. difficile.  Seems she already had 4 doses  Metabolic acidosis -P.o. sodium bicarbonate 650 mg 3 times daily  Hypokalemia/hypophosphatemia/hypomagnesemia:  -Replenish as appropriate.  Thrombocytopenia ([place]): Resolved.  Essential hypertension, benign: BP slightly elevated -Continue p.o. Coreg 6.25 mg twice daily.  Chronic lower back pain -Continue Tylenol and oxycodone as needed  History of CVA: No focal neuro symptoms but limited exam.  CT head without acute finding. -Continue home medications when able to take p.o.  Anxiety and depression: Currently undergoing alcohol withdrawal. -Ativan as above -  Decrease Zoloft.  Lactic acidosis: Resolved.  Rhabdomyolysis: Resolved.  DVT prophylaxis:  heparin injection 5,000 Units Start: 02/18/22 2200 SCDs Start: 02/17/22 1249 Place TED hose Start: 02/17/22 1249  Code Status: Full code Family Communication: Updated patient's sister, Diane over the phone. Unfortunately, the signal was not great Level of care: Progressive Status is: Inpatient Remains inpatient appropriate because: Encephalopathy, hallucination and possible delirium   Final disposition: TBD Consultants:  Pulmonology-signed off Plastic surgery-signed off   Sch Meds:  Scheduled  Meds:  buPROPion  300 mg Oral Daily   carvedilol  25 mg Oral BID WC   chlordiazePOXIDE  25 mg Oral Daily   Chlorhexidine Gluconate Cloth  6 each Topical Daily   divalproex  250 mg Oral L27N   folic acid  1 mg Oral Daily   Gerhardt's butt cream   Topical BID   heparin  5,000 Units Subcutaneous Q8H   insulin aspart  0-5 Units Subcutaneous QHS   insulin aspart  0-9 Units Subcutaneous TID WC   multivitamin with minerals  1 tablet Oral Daily   pantoprazole  40 mg Oral Daily   phenobarbital  32.4 mg Oral Q8H   rosuvastatin  20 mg Oral Daily   sertraline  100 mg Oral Daily   sodium bicarbonate  650 mg Oral TID   thiamine  100 mg Oral Daily   vancomycin  125 mg Oral QID   Continuous Infusions:  sodium chloride Stopped (02/17/22 1857)   PRN Meds:.acetaminophen **OR** acetaminophen, bisacodyl, LORazepam, ondansetron **OR** ondansetron (ZOFRAN) IV, oxyCODONE  Antimicrobials: Anti-infectives (From admission, onward)    Start     Dose/Rate Route Frequency Ordered Stop   02/20/22 1415  vancomycin (VANCOCIN) capsule 125 mg        125 mg Oral 4 times daily 02/20/22 1321 03/02/22 1359   02/18/22 0600  ceFAZolin (ANCEF) IVPB 2g/100 mL premix  Status:  Discontinued        2 g 200 mL/hr over 30 Minutes Intravenous On call to O.R. 02/17/22 1835 02/17/22 1955   02/17/22 1830  cefTRIAXone (ROCEPHIN) 2 g in sodium chloride 0.9 % 100 mL IVPB  Status:  Discontinued        2 g 200 mL/hr over 30 Minutes Intravenous Every 24 hours 02/17/22 1828 02/20/22 1316   02/17/22 1708  ceFAZolin 1 g / gentamicin 80 mg in NS 500 mL surgical irrigation  Status:  Discontinued          As needed 02/17/22 1709 02/17/22 1824   02/17/22 0800  ceFAZolin (ANCEF) IVPB 2g/100 mL premix        2 g 200 mL/hr over 30 Minutes Intravenous  Once 02/17/22 0757 02/17/22 0851        I have personally reviewed the following labs and images: CBC: Recent Labs  Lab 02/21/22 0315 02/22/22 0314 02/23/22 0635 02/24/22 0237  02/25/22 0152  WBC 4.6 5.8 8.4 7.9 7.6  HGB 8.7* 9.1* 10.0* 9.2* 10.6*  HCT 25.4* 27.9* 30.0* 28.9* 33.8*  MCV 93.0 93.9 93.5 95.7 96.6  PLT 135* 185 230 250 247   BMP &GFR Recent Labs  Lab 02/22/22 0314 02/23/22 0635 02/24/22 0237 02/25/22 0152 02/26/22 0210  NA 139 140 139 141 139  K 3.3* 3.9 3.7 3.8 3.8  CL 108 108 108 110 107  CO2 20* 20* 18* 20* 21*  GLUCOSE 73 109* 115* 96 109*  BUN '15 9 10 '$ 7* 8  CREATININE 1.86* 1.70* 1.74* 1.63* 1.37*  CALCIUM 8.3* 8.5* 8.4* 8.6*  8.8*  MG 1.7 1.8 1.9 2.1 2.0  PHOS 1.9* 4.3 4.4 3.3 3.1   Estimated Creatinine Clearance: 32.1 mL/min (A) (by C-G formula based on SCr of 1.37 mg/dL (H)). Liver & Pancreas: Recent Labs  Lab 02/20/22 1125 02/21/22 0315 02/22/22 0314 02/23/22 0635 02/24/22 0237 02/25/22 0152 02/26/22 0210  AST 57* 74* 42*  --   --  32  --   ALT 24 38 31  --   --  25  --   ALKPHOS 134* 115 106  --   --  104  --   BILITOT 0.5 0.5 0.6  --   --  0.6  --   PROT 6.0* 5.4* 5.6*  --   --  6.7  --   ALBUMIN 2.6* 2.3* 2.6* 2.7* 2.6* 2.9* 2.9*   No results for input(s): "LIPASE", "AMYLASE" in the last 168 hours. Recent Labs  Lab 02/20/22 1125 02/26/22 0210  AMMONIA 28 21   Diabetic: No results for input(s): "HGBA1C" in the last 72 hours.  Recent Labs  Lab 02/25/22 1146 02/25/22 1616 02/25/22 2119 02/26/22 0821 02/26/22 1122  GLUCAP 86 128* 77 83 178*   Cardiac Enzymes: Recent Labs  Lab 02/20/22 1125 02/21/22 0315 02/22/22 0314 02/23/22 0635  CKTOTAL 163 101 170 178   No results for input(s): "PROBNP" in the last 8760 hours. Coagulation Profile: No results for input(s): "INR", "PROTIME" in the last 168 hours.  Thyroid Function Tests: No results for input(s): "TSH", "T4TOTAL", "FREET4", "T3FREE", "THYROIDAB" in the last 72 hours. Lipid Profile: No results for input(s): "CHOL", "HDL", "LDLCALC", "TRIG", "CHOLHDL", "LDLDIRECT" in the last 72 hours. Anemia Panel: No results for input(s): "VITAMINB12",  "FOLATE", "FERRITIN", "TIBC", "IRON", "RETICCTPCT" in the last 72 hours.  Urine analysis:    Component Value Date/Time   COLORURINE YELLOW 02/17/2022 0756   APPEARANCEUR HAZY (A) 02/17/2022 0756   APPEARANCEUR Clear 02/05/2016 1300   LABSPEC 1.012 02/17/2022 0756   PHURINE 5.0 02/17/2022 0756   GLUCOSEU NEGATIVE 02/17/2022 0756   HGBUR MODERATE (A) 02/17/2022 0756   BILIRUBINUR NEGATIVE 02/17/2022 0756   BILIRUBINUR negative 09/20/2019 1545   BILIRUBINUR Negative 02/05/2016 1300   KETONESUR NEGATIVE 02/17/2022 0756   PROTEINUR 100 (A) 02/17/2022 0756   UROBILINOGEN 0.2 09/20/2019 1545   UROBILINOGEN 0.2 08/13/2014 1332   NITRITE NEGATIVE 02/17/2022 0756   LEUKOCYTESUR LARGE (A) 02/17/2022 0756   Sepsis Labs: Invalid input(s): "PROCALCITONIN", "LACTICIDVEN"  Microbiology: Recent Results (from the past 240 hour(s))  Resp Panel by RT-PCR (Flu A&B, Covid) Anterior Nasal Swab     Status: None   Collection Time: 02/17/22  7:56 AM   Specimen: Anterior Nasal Swab  Result Value Ref Range Status   SARS Coronavirus 2 by RT PCR NEGATIVE NEGATIVE Final    Comment: (NOTE) SARS-CoV-2 target nucleic acids are NOT DETECTED.  The SARS-CoV-2 RNA is generally detectable in upper respiratory specimens during the acute phase of infection. The lowest concentration of SARS-CoV-2 viral copies this assay can detect is 138 copies/mL. A negative result does not preclude SARS-Cov-2 infection and should not be used as the sole basis for treatment or other patient management decisions. A negative result may occur with  improper specimen collection/handling, submission of specimen other than nasopharyngeal swab, presence of viral mutation(s) within the areas targeted by this assay, and inadequate number of viral copies(<138 copies/mL). A negative result must be combined with clinical observations, patient history, and epidemiological information. The expected result is Negative.  Fact Sheet for  Patients:  EntrepreneurPulse.com.au  Fact Sheet for Healthcare Providers:  IncredibleEmployment.be  This test is no t yet approved or cleared by the [place] FDA and  has been authorized for detection and/or diagnosis of SARS-CoV-2 by FDA under an Emergency Use Authorization (EUA). This EUA will remain  in effect (meaning this test can be used) for the duration of the COVID-19 declaration under Section 564(b)(1) of the Act, 21 U.S.C.section 360bbb-3(b)(1), unless the authorization is terminated  or revoked sooner.       Influenza A by PCR NEGATIVE NEGATIVE Final   Influenza B by PCR NEGATIVE NEGATIVE Final    Comment: (NOTE) The Xpert Xpress SARS-CoV-2/FLU/RSV plus assay is intended as an aid in the diagnosis of influenza from Nasopharyngeal swab specimens and should not be used as a sole basis for treatment. Nasal washings and aspirates are unacceptable for Xpert Xpress SARS-CoV-2/FLU/RSV testing.  Fact Sheet for Patients: EntrepreneurPulse.com.au  Fact Sheet for Healthcare Providers: IncredibleEmployment.be  This test is not yet approved or cleared by the [place] FDA and has been authorized for detection and/or diagnosis of SARS-CoV-2 by FDA under an Emergency Use Authorization (EUA). This EUA will remain in effect (meaning this test can be used) for the duration of the COVID-19 declaration under Section 564(b)(1) of the Act, 21 U.S.C. section 360bbb-3(b)(1), unless the authorization is terminated or revoked.  Performed at [facility], [address], [place], [place] [address]   Surgical pcr screen     Status: None   Collection Time: 02/17/22  4:07 PM   Specimen: Nasal Mucosa; Nasal Swab  Result Value Ref Range Status   MRSA, PCR NEGATIVE NEGATIVE Final   Staphylococcus aureus NEGATIVE NEGATIVE Final    Comment: (NOTE) The Xpert SA Assay (FDA approved for NASAL specimens in patients  74 years of age and older), is one component of a comprehensive surveillance program. It is not intended to diagnose infection nor to guide or monitor treatment. Performed at [facility], [place] [address], [place], [place] [address]   Gastrointestinal Panel by PCR , Stool     Status: None   Collection Time: 02/17/22  6:50 PM   Specimen: Stool  Result Value Ref Range Status   Campylobacter species NOT DETECTED NOT DETECTED Final   Plesimonas shigelloides NOT DETECTED NOT DETECTED Final   Salmonella species NOT DETECTED NOT DETECTED Final   Yersinia enterocolitica NOT DETECTED NOT DETECTED Final   Vibrio species NOT DETECTED NOT DETECTED Final   Vibrio cholerae NOT DETECTED NOT DETECTED Final   Enteroaggregative E coli (EAEC) NOT DETECTED NOT DETECTED Final   Enteropathogenic E coli (EPEC) NOT DETECTED NOT DETECTED Final   Enterotoxigenic E coli (ETEC) NOT DETECTED NOT DETECTED Final   Shiga like toxin producing E coli (STEC) NOT DETECTED NOT DETECTED Final   Shigella/Enteroinvasive E coli (EIEC) NOT DETECTED NOT DETECTED Final   Cryptosporidium NOT DETECTED NOT DETECTED Final   Cyclospora cayetanensis NOT DETECTED NOT DETECTED Final   Entamoeba histolytica NOT DETECTED NOT DETECTED Final   Giardia lamblia NOT DETECTED NOT DETECTED Final   Adenovirus F40/41 NOT DETECTED NOT DETECTED Final   Astrovirus NOT DETECTED NOT DETECTED Final   Norovirus GI/GII NOT DETECTED NOT DETECTED Final   Rotavirus A NOT DETECTED NOT DETECTED Final   Sapovirus (I, II, IV, and V) NOT DETECTED NOT DETECTED Final    Comment: Performed at [facility], [address], [place], [place] [address]  Urine Culture     Status: None  Collection Time: 02/17/22 10:44 PM   Specimen: Urine, Catheterized  Result Value Ref Range Status   Specimen Description URINE, CATHETERIZED  Final   Special Requests NONE  Final   Culture   Final    NO GROWTH Performed at [facility], [address],  [place], [place] [address]    Report Status 02/18/2022 FINAL  Final  Culture, blood (Routine X 2) w Reflex to ID Panel     Status: None   Collection Time: 02/18/22 12:30 AM   Specimen: BLOOD RIGHT HAND  Result Value Ref Range Status   Specimen Description BLOOD RIGHT HAND  Final   Special Requests   Final    BOTTLES DRAWN AEROBIC AND ANAEROBIC Blood Culture results may not be optimal due to an inadequate volume of blood received in culture bottles   Culture   Final    NO GROWTH 5 DAYS Performed at [facility], [place] [address], [place], [place] [address]    Report Status 02/23/2022 FINAL  Final  Culture, blood (Routine X 2) w Reflex to ID Panel     Status: None   Collection Time: 02/18/22 12:54 AM   Specimen: BLOOD LEFT HAND  Result Value Ref Range Status   Specimen Description BLOOD LEFT HAND  Final   Special Requests   Final    BOTTLES DRAWN AEROBIC AND ANAEROBIC Blood Culture results may not be optimal due to an inadequate volume of blood received in culture bottles   Culture   Final    NO GROWTH 5 DAYS Performed at [facility], [place] [address], [place], [place] [address]    Report Status 02/23/2022 FINAL  Final  C Difficile Quick Screen w PCR reflex     Status: Abnormal   Collection Time: 02/18/22 11:30 AM   Specimen: STOOL  Result Value Ref Range Status   C Diff antigen POSITIVE (A) NEGATIVE Final   C Diff toxin NEGATIVE NEGATIVE Final   C Diff interpretation Results are indeterminate. See PCR results.  Final    Comment: Performed at [facility], [place] [address] [place], [place] [address]  C. Diff by PCR, Reflexed     Status: Abnormal   Collection Time: 02/18/22 11:30 AM  Result Value Ref Range Status   Toxigenic C. Difficile by PCR POSITIVE (A) NEGATIVE Final    Comment: Positive for toxigenic C. difficile with little to no toxin production. Only treat if clinical presentation suggests symptomatic illness. CRITICAL RESULT CALLED TO, READ BACK BY AND VERIFIED  WITH: RN Fabio Neighbors [phone] '@[phone]'$  FH Performed at [facility], [address] [place], [place] [address]     Radiology Studies: No results found.    Anival Pasha T. [place]  If 7PM-7AM, please contact night-coverage [email or web address] 02/26/2022, 3:01 PM

## 2022-02-27 DIAGNOSIS — S0993XA Unspecified injury of face, initial encounter: Secondary | ICD-10-CM | POA: Diagnosis not present

## 2022-02-27 DIAGNOSIS — I9589 Other hypotension: Secondary | ICD-10-CM | POA: Diagnosis not present

## 2022-02-27 DIAGNOSIS — G9341 Metabolic encephalopathy: Secondary | ICD-10-CM | POA: Diagnosis not present

## 2022-02-27 DIAGNOSIS — R571 Hypovolemic shock: Secondary | ICD-10-CM | POA: Diagnosis not present

## 2022-02-27 LAB — RENAL FUNCTION PANEL
Albumin: 3 g/dL — ABNORMAL LOW (ref 3.5–5.0)
Anion gap: 9 (ref 5–15)
BUN: 5 mg/dL — ABNORMAL LOW (ref 8–23)
CO2: 21 mmol/L — ABNORMAL LOW (ref 22–32)
Calcium: 8.8 mg/dL — ABNORMAL LOW (ref 8.9–10.3)
Chloride: 111 mmol/L (ref 98–111)
Creatinine, Ser: 1.31 mg/dL — ABNORMAL HIGH (ref 0.44–1.00)
GFR, Estimated: 44 mL/min — ABNORMAL LOW (ref >60)
Glucose, Bld: 109 mg/dL — ABNORMAL HIGH (ref 70–99)
Phosphorus: 3.2 mg/dL (ref 2.5–4.6)
Potassium: 3.5 mmol/L (ref 3.5–5.1)
Sodium: 141 mmol/L (ref 135–145)

## 2022-02-27 LAB — CBC
HCT: 33 % — ABNORMAL LOW (ref 36.0–46.0)
Hemoglobin: 10.6 g/dL — ABNORMAL LOW (ref 12.0–15.0)
MCH: 30.6 pg (ref 26.0–34.0)
MCHC: 32.1 g/dL (ref 30.0–36.0)
MCV: 95.4 fL (ref 80.0–100.0)
Platelets: 280 10*3/uL (ref 150–400)
RBC: 3.46 MIL/uL — ABNORMAL LOW (ref 3.87–5.11)
RDW: 14.4 % (ref 11.5–15.5)
WBC: 7.9 10*3/uL (ref 4.0–10.5)
nRBC: 0 % (ref 0.0–0.2)

## 2022-02-27 LAB — GLUCOSE, CAPILLARY
Glucose-Capillary: 120 mg/dL — ABNORMAL HIGH (ref 70–99)
Glucose-Capillary: 157 mg/dL — ABNORMAL HIGH (ref 70–99)
Glucose-Capillary: 131 mg/dL — ABNORMAL HIGH (ref 70–99)
Glucose-Capillary: 129 mg/dL — ABNORMAL HIGH (ref 70–99)

## 2022-02-27 LAB — MAGNESIUM: Magnesium: 2.3 mg/dL (ref 1.7–2.4)

## 2022-02-27 MED ORDER — POTASSIUM CHLORIDE CRYS ER 20 MEQ PO TBCR
40.0000 meq | EXTENDED_RELEASE_TABLET | Freq: Once | ORAL | Status: AC
  Administered 2022-02-27: 40 meq via ORAL
  Filled 2022-02-27: qty 2

## 2022-02-27 MED ORDER — LORAZEPAM 2 MG/ML IJ SOLN
0.5000 mg | INTRAMUSCULAR | Status: DC | PRN
  Administered 2022-02-28 – 2022-03-02 (×6): 0.5 mg via INTRAVENOUS
  Filled 2022-02-27 (×7): qty 1

## 2022-02-27 MED ORDER — CLONAZEPAM 1 MG PO TABS
1.0000 mg | ORAL_TABLET | Freq: Two times a day (BID) | ORAL | Status: DC
  Administered 2022-02-27 – 2022-02-28 (×2): 1 mg via ORAL
  Filled 2022-02-27 (×2): qty 1

## 2022-02-27 NOTE — Plan of Care (Signed)
  Problem: Education: Goal: Ability to describe self-care measures that may prevent or decrease complications (Diabetes Survival Skills Education) will improve Outcome: Progressing   Problem: Education: Goal: Individualized Educational Video(s) Outcome: Progressing   Problem: Coping: Goal: Ability to adjust to condition or change in health will improve Outcome: Progressing   Problem: Fluid Volume: Goal: Ability to maintain a balanced intake and output will improve Outcome: Progressing   Problem: Health Behavior/Discharge Planning: Goal: Ability to identify and utilize available resources and services will improve Outcome: Progressing   Problem: Health Behavior/Discharge Planning: Goal: Ability to manage health-related needs will improve Outcome: Progressing   Problem: Education: Goal: Ability to describe self-care measures that may prevent or decrease complications (Diabetes Survival Skills Education) will improve Outcome: Progressing Goal: Individualized Educational Video(s) Outcome: Progressing   Problem: Coping: Goal: Ability to adjust to condition or change in health will improve Outcome: Progressing   Problem: Fluid Volume: Goal: Ability to maintain a balanced intake and output will improve Outcome: Progressing   Problem: Health Behavior/Discharge Planning: Goal: Ability to identify and utilize available resources and services will improve Outcome: Progressing Goal: Ability to manage health-related needs will improve Outcome: Progressing   Problem: Metabolic: Goal: Ability to maintain appropriate glucose levels will improve Outcome: Progressing   Problem: Nutritional: Goal: Maintenance of adequate nutrition will improve Outcome: Progressing Goal: Progress toward achieving an optimal weight will improve Outcome: Progressing   Problem: Skin Integrity: Goal: Risk for impaired skin integrity will decrease Outcome: Progressing   Problem: Tissue  Perfusion: Goal: Adequacy of tissue perfusion will improve Outcome: Progressing   Problem: Safety: Goal: Non-violent Restraint(s) Outcome: Progressing   Problem: Education: Goal: Knowledge of General Education information will improve Description: Including pain rating scale, medication(s)/side effects and non-pharmacologic comfort measures Outcome: Progressing   Problem: Health Behavior/Discharge Planning: Goal: Ability to manage health-related needs will improve Outcome: Progressing   Problem: Clinical Measurements: Goal: Ability to maintain clinical measurements within normal limits will improve Outcome: Progressing Goal: Will remain free from infection Outcome: Progressing Goal: Diagnostic test results will improve Outcome: Progressing Goal: Respiratory complications will improve Outcome: Progressing Goal: Cardiovascular complication will be avoided Outcome: Progressing   Problem: Activity: Goal: Risk for activity intolerance will decrease Outcome: Progressing   Problem: Nutrition: Goal: Adequate nutrition will be maintained Outcome: Progressing   Problem: Coping: Goal: Level of anxiety will decrease Outcome: Progressing   Problem: Elimination: Goal: Will not experience complications related to bowel motility Outcome: Progressing Goal: Will not experience complications related to urinary retention Outcome: Progressing   Problem: Pain Managment: Goal: General experience of comfort will improve Outcome: Progressing   Problem: Safety: Goal: Ability to remain free from injury will improve Outcome: Progressing   Problem: Skin Integrity: Goal: Risk for impaired skin integrity will decrease Outcome: Progressing

## 2022-02-27 NOTE — Progress Notes (Signed)
PROGRESS NOTE  [name] [name]:[id number] DOB: 11/26/1950   PCP: Celene Squibb, MD  Patient is from: Home  DOA: 02/17/2022 LOS: 92  Chief complaints Chief Complaint  Patient presents with   Fall     Brief Narrative / Interim history: 71 year old F with PMH of EtOH abuse, combined CHF s/p BIV PPM, CVA, DM-2, CKD-3B, neurogenic bladder with self-catheterization, chronic pain, GERD, anxiety, insomnia and depression presenting to [facility] after a fall at home the night of 6/5-6 hitting her head against a dresser while wearing her glasses.  Denies LOC.  Reportedly, "bled everywhere and cut the flap of skin off at home".  In ED, she was hypotensive to 74/44. Cr 5.6.  BUN 67.  Lactate 2.1.  Head CT negative for acute finding.  She was started on regular IV fluid and vasopressors and admitted to ICU for hypovolemic shock, AKI and acute blood loss anemia.  Patient had laceration repair by plastic surgery.  She came off vasopressors.  She is transferred to hospitalist service on 6/9.  She is currently undergoing alcohol withdrawal.  Finishing phenobarbital and Librium taper.  She is also on Depakote.  Weaning of Ativan as well.  Now with visual hallucination.   Subjective: Seen and examined earlier this morning.  No major events overnight of this morning.  She is awake, alert and oriented to self and place but not situation.  She seems to have visual hallucination.  She says she sees a lot of ticks on the bed.  Objective: Vitals:   02/26/22 2258 02/27/22 0321 02/27/22 0727 02/27/22 1105  BP: (!) 163/78 (!) 160/85 (!) 156/87 112/72  Pulse: 98 99 (!) 103 (!) 105  Resp: '15 16 19 18  '$ Temp: 98.2 F (36.8 C) 98.7 F (37.1 C) 98.4 F (36.9 C) 98.3 F (36.8 C)  TempSrc: Oral Oral Oral Oral  SpO2: 96% 98% 97% 96%  Weight:      Height:        Examination:  GENERAL: No apparent distress.  Nontoxic. HEENT: MMM.  Vision and hearing grossly intact.  NECK: Supple.  No apparent JVD.  RESP:  No  IWOB.  Fair aeration bilaterally. CVS:  RRR. Heart sounds normal.  ABD/GI/GU: BS+. Abd soft, NTND.  MSK/EXT:  Moves extremities. No apparent deformity. No edema.  SKIN: Dressing over left face and left eye DCI. NEURO: Awake and alert. Oriented to self and place.  No apparent focal neuro deficit. PSYCH: Seems to have visual hallucination.  Procedures:  6/8-complex left facial laceration repair by plastic surgery  Microbiology summarized: COVID-19 and influenza PCR nonreactive. MRSA PCR screen negative. Blood and urine cultures NGTD GIP negative. C. difficile PCR screen positive.  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 Hospital delirium? She should be outside withdrawal window for alcohol.   -Completed high-dose thiamine -Completed phenobarbital and Librium taper. -Stop Depakote -Resume home Klonopin 1 mg twice daily -Her prolonged QTc precludes antipsychotic use. -Decreased home Zoloft to 100 mg daily -Continue soft restraints due to risk for fall and serious injury. -Reorientation and delirium precautions. -Psychiatry consult in the morning if  no improvement -PCCM signed off.  Facial trauma due to fall at home In the setting of fall.  CT head without acute finding. -S/p complex laceration repair on 6/8 by plastic surgery. -Bactroban to cheek, erythromycin oph oint to left eye and KY gel to lateral nose daily -Outpatient follow-up with plastic surgery in 10 days-Will notify plastic surgery if still here -Fall precaution -PT/OT eval  Hypovolemic shock ([place]) Likely hypovolemic in the setting of dehydration, alcohol abuse and acute blood loss.  Resolved.  TTE with LVEF of 55 to 60%, G1-DD, normal RV/PASP and no WMA.  -IV fluid due to poor p.o. intake in the setting of encephalopathy and alcohol withdrawal.  AKI on CKD-3A: B/l Cr 1.3-1.5.  Resolved. Recent Labs    02/18/22 0031 02/19/22 0355 02/20/22 1125 02/21/22 0315 02/22/22 0314 02/23/22 2993 02/24/22 0237 02/25/22 0152 02/26/22 0210 02/27/22 0403  BUN 58* 47* 27* '23 15 9 10 '$ 7* 8 5*  CREATININE 4.88* 3.44* 2.44* 2.07* 1.86* 1.70* 1.74* 1.63* 1.37* 1.31*  -Continue IV fluid -Avoid nephrotoxins.  ABLA superimposed on anemia of chronic disease: H&H stable.  Anemia panel without significant finding. Recent Labs    02/17/22 1524 02/18/22 0031 02/19/22 0355 02/20/22 1125 02/21/22 0315 02/22/22 0314 02/23/22 7169 02/24/22 0237 02/25/22 0152 02/27/22 0403  HGB 9.5* 8.9* 9.5* 9.0* 8.7* 9.1* 10.0* 9.2* 10.6* 10.6*  -Monitor H&H.  Clostridioides difficile infection: C. difficile toxin is negative but antigen and PCR positive. She was on ceftriaxone for possible UTI.  Diarrhea seems to have resolved. -Ceftriaxone discontinued.  -Continue p.o. 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  No cardiopulmonary symptoms.  Appears euvolemic on exam.  On p.o. Lasix 40 mg daily at home. -Continue holding diuretics in the setting of  AKI -Monitor intake and output, daily weight, renal functions and electrolytes -Continue Coreg. -Continue holding losartan  Uncontrolled hypertension.  She also had mild tachycardia. -Increase Coreg to 25 mg twice daily  Uncontrolled IDDM-2 with hyperglycemia and CKD-3B: A1c 7.3%. Recent Labs  Lab 02/26/22 1122 02/26/22 1559 02/26/22 2137 02/27/22 0727 02/27/22 1215  GLUCAP 178* 127* 149* 120* 157*  -Continue holding basal insulin. -Continue SSI-sensitive  Urinary tract infection: Patient with neurogenic bladder and intermittent self-catheterization.  She is at risk for UTI.  UA with bacteria and pyuria.  Surprisingly urine culture is negative.  -Discontinued ceftriaxone given C. difficile.  Seems she already had 4 doses  Metabolic acidosis -P.o. sodium bicarbonate 650 mg 3 times daily  Hypokalemia/hypophosphatemia/hypomagnesemia:  -Monitor and replenish as appropriate.  Thrombocytopenia ([place]): Resolved.  Essential hypertension, benign: BP slightly elevated -Continue p.o. Coreg 6.25 mg twice daily.  Chronic lower back pain -Continue Tylenol and oxycodone as needed  History of CVA: No focal neuro symptoms but limited exam.  CT head without acute finding. -Continue home medications  Anxiety and depression: -Zoloft, Klonopin and as needed Ativan as above  Lactic acidosis: Resolved.  Rhabdomyolysis: Resolved.  DVT prophylaxis:  heparin injection 5,000 Units Start: 02/18/22 2200 SCDs Start: 02/17/22 1249 Place TED hose Start: 02/17/22 1249  Code Status: Full code Family Communication: Updated patient's sister, Diane over the phone on 6/15.   Level of care: Progressive Status is: Inpatient Remains inpatient appropriate because: Encephalopathy, hallucination and possible delirium   Final disposition: TBD Consultants:  Pulmonology-signed off Plastic surgery-signed off   Sch Meds:  Scheduled Meds:  buPROPion  300 mg Oral Daily   carvedilol  25 mg Oral BID WC    Chlorhexidine Gluconate Cloth  6 each Topical Daily   folic acid  1 mg Oral Daily   Gerhardt's butt cream   Topical BID   heparin  5,000 Units Subcutaneous Q8H   insulin aspart  0-5 Units Subcutaneous QHS   insulin aspart  0-9 Units Subcutaneous TID WC   multivitamin with minerals  1 tablet Oral Daily   pantoprazole  40 mg Oral Daily   rosuvastatin  20 mg Oral Daily   sertraline  100 mg Oral Daily   sodium bicarbonate  650 mg Oral TID   thiamine  100 mg Oral Daily   vancomycin  125 mg Oral QID   Continuous Infusions:  sodium chloride Stopped (02/17/22 1857)   PRN Meds:.acetaminophen **OR** acetaminophen, bisacodyl, LORazepam, ondansetron **OR** ondansetron (ZOFRAN) IV, oxyCODONE  Antimicrobials: Anti-infectives (From admission, onward)    Start     Dose/Rate Route Frequency Ordered Stop   02/20/22 1415  vancomycin (VANCOCIN) capsule 125 mg        125 mg Oral 4 times daily 02/20/22 1321 03/02/22 1359   02/18/22 0600  ceFAZolin (ANCEF) IVPB 2g/100 mL premix  Status:  Discontinued        2 g 200 mL/hr over 30 Minutes Intravenous On call to O.R. 02/17/22 1835 02/17/22 1955   02/17/22 1830  cefTRIAXone (ROCEPHIN) 2 g in sodium chloride 0.9 % 100 mL IVPB  Status:  Discontinued        2 g 200 mL/hr over 30 Minutes Intravenous Every 24 hours 02/17/22 1828 02/20/22 1316   02/17/22 1708  ceFAZolin 1 g / gentamicin 80 mg in NS 500 mL surgical irrigation  Status:  Discontinued          As needed 02/17/22 1709 02/17/22 1824   02/17/22 0800  ceFAZolin (ANCEF) IVPB 2g/100 mL premix        2 g 200 mL/hr over 30 Minutes Intravenous  Once 02/17/22 0757 02/17/22 0851        I have personally reviewed the following labs and images: CBC: Recent Labs  Lab 02/22/22 0314 02/23/22 0635 02/24/22 0237 02/25/22 0152 02/27/22 0403  WBC 5.8 8.4 7.9 7.6 7.9  HGB 9.1* 10.0* 9.2* 10.6* 10.6*  HCT 27.9* 30.0* 28.9* 33.8* 33.0*  MCV 93.9 93.5 95.7 96.6 95.4  PLT 185 230 250 247 280   BMP  &GFR Recent Labs  Lab 02/23/22 0635 02/24/22 0237 02/25/22 0152 02/26/22 0210 02/27/22 0403  NA 140 139 141 139 141  K 3.9 3.7 3.8 3.8 3.5  CL 108 108 110 107 111  CO2 20* 18* 20* 21* 21*  GLUCOSE 109* 115* 96 109* 109*  BUN 9 10 7* 8 5*  CREATININE 1.70* 1.74* 1.63* 1.37* 1.31*  CALCIUM 8.5* 8.4* 8.6* 8.8* 8.8*  MG 1.8 1.9 2.1 2.0 2.3  PHOS 4.3 4.4 3.3 3.1 3.2   Estimated Creatinine Clearance: 33.5 mL/min (A) (by C-G formula based on SCr of 1.31 mg/dL (H)).  Liver & Pancreas: Recent Labs  Lab 02/21/22 0315 02/22/22 0314 02/23/22 0635 02/24/22 0237 02/25/22 0152 02/26/22 0210 02/27/22 0403  AST 74* 42*  --   --  32  --   --   ALT 38 31  --   --  25  --   --   ALKPHOS 115 106  --   --  104  --   --   BILITOT 0.5 0.6  --   --  0.6  --   --   PROT 5.4* 5.6*  --   --  6.7  --   --   ALBUMIN 2.3* 2.6* 2.7* 2.6* 2.9* 2.9* 3.0*   No results for input(s): "LIPASE", "AMYLASE" in the last 168 hours. Recent Labs  Lab 02/26/22 0210  AMMONIA 21   Diabetic: No results for input(s): "HGBA1C" in the last 72 hours.  Recent Labs  Lab 02/26/22 1122 02/26/22 1559 02/26/22 2137 02/27/22 0727 02/27/22 1215  GLUCAP 178* 127* 149* 120* 157*   Cardiac Enzymes: Recent Labs  Lab 02/21/22 0315 02/22/22 0314 02/23/22 0635  CKTOTAL 101 170 178   No results for input(s): "PROBNP" in the last 8760 hours. Coagulation Profile: No results for input(s): "INR", "PROTIME" in the last 168 hours.  Thyroid Function Tests: No results for input(s): "TSH", "T4TOTAL", "FREET4", "T3FREE", "THYROIDAB" in the last 72 hours. Lipid Profile: No results for input(s): "CHOL", "HDL", "LDLCALC", "TRIG", "CHOLHDL", "LDLDIRECT" in the last 72 hours. Anemia Panel: No results for input(s): "VITAMINB12", "FOLATE", "FERRITIN", "TIBC", "IRON", "RETICCTPCT" in the last 72 hours.  Urine analysis:    Component Value Date/Time   COLORURINE YELLOW 02/17/2022 0756   APPEARANCEUR HAZY (A) 02/17/2022 0756    APPEARANCEUR Clear 02/05/2016 1300   LABSPEC 1.012 02/17/2022 0756   PHURINE 5.0 02/17/2022 0756   GLUCOSEU NEGATIVE 02/17/2022 0756   HGBUR MODERATE (A) 02/17/2022 0756   BILIRUBINUR NEGATIVE 02/17/2022 0756   BILIRUBINUR negative 09/20/2019 1545   BILIRUBINUR Negative 02/05/2016 1300   KETONESUR NEGATIVE 02/17/2022 0756   PROTEINUR 100 (A) 02/17/2022 0756   UROBILINOGEN 0.2 09/20/2019 1545   UROBILINOGEN 0.2 08/13/2014 1332   NITRITE NEGATIVE 02/17/2022 0756   LEUKOCYTESUR LARGE (A) 02/17/2022 0756   Sepsis Labs: Invalid input(s): "PROCALCITONIN", "LACTICIDVEN"  Microbiology: Recent Results (from the past 240 hour(s))  Surgical pcr screen     Status: None   Collection Time: 02/17/22  4:07 PM   Specimen: Nasal Mucosa; Nasal Swab  Result Value Ref Range Status   MRSA, PCR NEGATIVE NEGATIVE Final   Staphylococcus aureus NEGATIVE NEGATIVE Final    Comment: (NOTE) The Xpert SA Assay (FDA approved for NASAL specimens in patients 59 years of age and older), is one component of a comprehensive surveillance program. It is not intended to diagnose infection nor to guide or monitor treatment. Performed at [facility], [place] [address], [place], [place] [address]   Gastrointestinal Panel by PCR , Stool     Status: None   Collection Time: 02/17/22  6:50 PM   Specimen: Stool  Result Value Ref Range Status   Campylobacter species NOT DETECTED NOT DETECTED Final   Plesimonas shigelloides NOT DETECTED NOT DETECTED Final   Salmonella species NOT DETECTED NOT DETECTED Final   Yersinia enterocolitica NOT DETECTED NOT DETECTED Final   Vibrio species NOT DETECTED NOT DETECTED Final   Vibrio cholerae NOT DETECTED NOT DETECTED Final   Enteroaggregative E coli (EAEC) NOT DETECTED NOT DETECTED Final   Enteropathogenic E coli (EPEC) NOT  DETECTED NOT DETECTED Final   Enterotoxigenic E coli (ETEC) NOT DETECTED NOT DETECTED Final   Shiga like toxin producing E coli (STEC) NOT DETECTED NOT  DETECTED Final   Shigella/Enteroinvasive E coli (EIEC) NOT DETECTED NOT DETECTED Final   Cryptosporidium NOT DETECTED NOT DETECTED Final   Cyclospora cayetanensis NOT DETECTED NOT DETECTED Final   Entamoeba histolytica NOT DETECTED NOT DETECTED Final   Giardia lamblia NOT DETECTED NOT DETECTED Final   Adenovirus F40/41 NOT DETECTED NOT DETECTED Final   Astrovirus NOT DETECTED NOT DETECTED Final   Norovirus GI/GII NOT DETECTED NOT DETECTED Final   Rotavirus A NOT DETECTED NOT DETECTED Final   Sapovirus (I, II, IV, and V) NOT DETECTED NOT DETECTED Final    Comment: Performed at [facility], [address] [place], [place] [address]  Urine Culture     Status: None   Collection Time: 02/17/22 10:44 PM   Specimen: Urine, Catheterized  Result Value Ref Range Status   Specimen Description URINE, CATHETERIZED  Final   Special Requests NONE  Final   Culture   Final    NO GROWTH Performed at [facility], [place] [address] [place], [place] [address]    Report Status 02/18/2022 FINAL  Final  Culture, blood (Routine X 2) w Reflex to ID Panel     Status: None   Collection Time: 02/18/22 12:30 AM   Specimen: BLOOD RIGHT HAND  Result Value Ref Range Status   Specimen Description BLOOD RIGHT HAND  Final   Special Requests   Final    BOTTLES DRAWN AEROBIC AND ANAEROBIC Blood Culture results may not be optimal due to an inadequate volume of blood received in culture bottles   Culture   Final    NO GROWTH 5 DAYS Performed at [facility], [place] [address], [place], [place] [address]    Report Status 02/23/2022 FINAL  Final  Culture, blood (Routine X 2) w Reflex to ID Panel     Status: None   Collection Time: 02/18/22 12:54 AM   Specimen: BLOOD LEFT HAND  Result Value Ref Range Status   Specimen Description BLOOD LEFT HAND  Final   Special Requests   Final    BOTTLES DRAWN AEROBIC AND ANAEROBIC Blood Culture results may not be optimal due to an inadequate volume of blood  received in culture bottles   Culture   Final    NO GROWTH 5 DAYS Performed at [facility], [place] [address] [place], [place] [address]    Report Status 02/23/2022 FINAL  Final  C Difficile Quick Screen w PCR reflex     Status: Abnormal   Collection Time: 02/18/22 11:30 AM   Specimen: STOOL  Result Value Ref Range Status   C Diff antigen POSITIVE (A) NEGATIVE Final   C Diff toxin NEGATIVE NEGATIVE Final   C Diff interpretation Results are indeterminate. See PCR results.  Final    Comment: Performed at [facility], [place] [address] [place], [place] [address]  C. Diff by PCR, Reflexed     Status: Abnormal   Collection Time: 02/18/22 11:30 AM  Result Value Ref Range Status   Toxigenic C. Difficile by PCR POSITIVE (A) NEGATIVE Final    Comment: Positive for toxigenic C. difficile with little to no toxin production. Only treat if clinical presentation suggests symptomatic illness. CRITICAL RESULT CALLED TO, READ BACK BY AND VERIFIED WITH: RN Fabio Neighbors [phone] '@[phone]'$  FH Performed at [facility]  Lab, [address], [place], [place] [address]     Radiology Studies: No results found.    Camden Mazzaferro T. [place]  If 7PM-7AM, please contact night-coverage [email or web address] 02/27/2022, 3:33 PM

## 2022-02-27 NOTE — Progress Notes (Signed)
Physical Therapy Treatment Patient Details Name: [name] MRN: [id number] DOB: 1950/10/30 Today's Date: 02/27/2022   History of Present Illness 71 y.o. female presents to [facility] on 02/17/2022 s/p fall, striking head on dresser. Pt underwent laceration repair on 6/6. 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    PT Comments    Pt continues to be difficult to work with due to withdrawal symptoms. Today was lethargic and unable to arouse pt enough to be able to mobilize past chair position in bed. Currently unable to predict or recommend follow up dc destination. Will need to see how she does once she is out of withdrawals.    Recommendations for follow up therapy are one component of a multi-disciplinary discharge planning process, led by the attending physician.  Recommendations may be updated based on patient status, additional functional criteria and insurance authorization.  Follow Up Recommendations  Other (comment) (will need to reassess once pt is out of withdrawals)     Assistance Recommended at Discharge    Patient can return home with the following     Equipment Recommendations       Recommendations for Other Services       Precautions / Restrictions Precautions Precautions: Fall;Other (comment) Precaution Comments: CIWA - bilat wrist restraints, bilat mitts, belt restrait. flexi & foley     Mobility  Bed Mobility Overal bed mobility: Needs Assistance       Supine to sit: +2 for physical assistance, Total assist     General bed mobility comments: Placed bed in chair position and brought pt forward into sitting to attempt to arouse pt enough to mobilize but pt remained unaroused.    Transfers                        Ambulation/Gait                   Stairs             Wheelchair Mobility    Modified Rankin (Stroke Patients Only)        Balance Overall balance assessment: Needs assistance Sitting-balance support: Feet supported, No upper extremity supported Sitting balance-Leahy Scale: Zero Sitting balance - Comments: total assist due to lethargy                                    Cognition Arousal/Alertness: Lethargic   Overall Cognitive Status: Difficult to assess                                 General Comments: Unable to get pt to arouse enough to follow any commands        Exercises      General Comments        Pertinent Vitals/Pain Pain Assessment Breathing: normal Negative Vocalization: none Facial Expression: smiling or inexpressive Body Language: relaxed    Home Living                          Prior Function            PT Goals (current goals can now be found in the care plan section) Progress towards PT goals: Not progressing toward goals - comment (ongoing withdrawals)  Frequency    Min 3X/week      PT Plan Discharge plan needs to be updated    Co-evaluation              AM-PAC PT "6 Clicks" Mobility   Outcome Measure  Help needed turning from your back to your side while in a flat bed without using bedrails?: Total Help needed moving from lying on your back to sitting on the side of a flat bed without using bedrails?: Total Help needed moving to and from a bed to a chair (including a wheelchair)?: Total Help needed standing up from a chair using your arms (e.g., wheelchair or bedside chair)?: Total Help needed to walk in hospital room?: Total Help needed climbing 3-5 steps with a railing? : Total 6 Click Score: 6    End of Session     Patient left: in bed;with bed alarm set;with restraints reapplied   PT Visit Diagnosis: Unsteadiness on feet (R26.81);Other abnormalities of gait and mobility (R26.89);History of falling (Z91.81)     Time: 7824-2353 PT Time Calculation (min) (ACUTE ONLY): 13 min  Charges:   $Therapeutic Activity: 8-22 mins                     [place] [facility] [place] 02/27/2022, 2:44 PM

## 2022-02-27 NOTE — Progress Notes (Signed)
Remote pacemaker transmission.   

## 2022-02-28 DIAGNOSIS — I9589 Other hypotension: Secondary | ICD-10-CM | POA: Diagnosis not present

## 2022-02-28 DIAGNOSIS — S0993XA Unspecified injury of face, initial encounter: Secondary | ICD-10-CM | POA: Diagnosis not present

## 2022-02-28 DIAGNOSIS — G9341 Metabolic encephalopathy: Secondary | ICD-10-CM | POA: Diagnosis not present

## 2022-02-28 DIAGNOSIS — R571 Hypovolemic shock: Secondary | ICD-10-CM | POA: Diagnosis not present

## 2022-02-28 LAB — GLUCOSE, CAPILLARY
Glucose-Capillary: 96 mg/dL (ref 70–99)
Glucose-Capillary: 162 mg/dL — ABNORMAL HIGH (ref 70–99)
Glucose-Capillary: 109 mg/dL — ABNORMAL HIGH (ref 70–99)

## 2022-02-28 NOTE — Progress Notes (Signed)
PROGRESS NOTE  [name] LZJ:[id number] DOB: [birth year]   PCP: Celene Squibb, MD  Patient is from: Home  DOA: 02/17/2022 LOS: 27  Chief complaints Chief Complaint  Patient presents with   Fall     Brief Narrative / Interim history: 71 year old F with PMH of EtOH abuse, combined CHF s/p BIV PPM, CVA, DM-2, CKD-3B, neurogenic bladder with self-catheterization, chronic pain, GERD, anxiety, insomnia and depression presenting to [facility] after a fall at home the night of 6/5-6 hitting her head against a dresser while wearing her glasses.  Denies LOC.  Reportedly, "bled everywhere and cut the flap of skin off at home".  In ED, she was hypotensive to 74/44. Cr 5.6.  BUN 67.  Lactate 2.1.  Head CT negative for acute finding.  She was started on regular IV fluid and vasopressors and admitted to ICU for hypovolemic shock, AKI and acute blood loss anemia.  Patient had laceration repair by plastic surgery.  She came off vasopressors and transferred to hospitalist service on 6/9.  However, she went into alcohol withdrawal with delirium.  PCCM was consulted and started phenobarbital and Librium taper as well as Depakote with as needed Ativan.  She completed phenobarbital and Librium tapers and remained confused and agitated requiring soft restraints.  She also had some visual hallucination.  We discontinued Depakote and decreased as needed Ativan.  She is currently on a very low-dose as needed Ativan.  Also discontinued restraints and requested one-to-one Air cabin crew.  May need psych input if if she does not improve off sedating medications and restraints.  Subjective: Seen and examined earlier this morning and later this afternoon.  Patient was very sleepy earlier this morning after Klonopin last night and Ativan early in the morning.  We discontinued Klonopin this morning.  She is more awake this afternoon.  She is asking to have the restraints removed.  She is oriented to self, place, month, year and  person and to some extent situation.   Objective: Vitals:   02/28/22 0500 02/28/22 0722 02/28/22 1127 02/28/22 1529  BP:  (!) 145/69 123/62 135/65  Pulse:  91 96 100  Resp:  19 (!) 23 18  Temp:  98.3 F (36.8 C) 98.4 F (36.9 C) 98.1 F (36.7 C)  TempSrc:  Axillary Axillary Oral  SpO2:  96% 94% 100%  Weight: 59.2 kg     Height:        Examination:  GENERAL: No apparent distress.  Nontoxic. HEENT: MMM.  Vision and hearing grossly intact.  NECK: Supple.  No apparent JVD.  RESP:  No IWOB.  Fair aeration bilaterally. CVS:  RRR. Heart sounds normal.  ABD/GI/GU: BS+. Abd soft, NTND.  MSK/EXT:  Moves extremities.  Bilateral wrist restraints. SKIN: Dressing over left face DCI. NEURO: Awake and alert. Oriented x4 except date.  No apparent focal neuro deficit. PSYCH: Calm. Normal affect.   Procedures:  6/8-complex left facial laceration repair by plastic surgery  Microbiology summarized: COVID-19 and influenza PCR nonreactive. MRSA PCR screen negative. Blood and urine cultures NGTD GIP negative. C. difficile PCR screen positive.  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  However, she is awake and oriented x4 except date but confused at times.  She was started on phenobarbital and Librium taper as well as scheduled Depakote and as needed Ativan by PCCM. -Completed high-dose thiamine, phenobarbital and Librium taper -Depakote discontinued on 6/16. -Discontinue Klonopin, restraints, telemetry wires and Flexi-Seal -Decreased home Zoloft to 100 mg daily -Check TSH and RPR in the morning -One-to-one safety sitter requested. -Reorientation and delirium precautions. -Psychiatry consult if no improvement with the above intervention. -PCCM signed off.  Facial trauma due to fall at home In the setting of fall.  CT head without acute finding. -S/p complex laceration repair on 6/8 by plastic surgery. -Bactroban to cheek, erythromycin oph oint to left eye and KY gel to lateral nose daily -Outpatient follow-up with plastic surgery in 10 days-Will notify plastic surgery if still here -Fall precaution -PT/OT eval  Hypovolemic shock ([place]) Likely hypovolemic in the setting of dehydration, alcohol abuse and acute blood loss.  Resolved.  TTE with LVEF of 55 to 60%, G1-DD, normal RV/PASP and no WMA.  -IV fluid due to poor p.o. intake in the setting of encephalopathy and alcohol withdrawal.  AKI on CKD-3A: B/l Cr 1.3-1.5.  Resolved. Recent Labs    02/18/22 0031 02/19/22 0355 02/20/22 1125 02/21/22 0315 02/22/22 0314 02/23/22 0635 02/24/22 0237 02/25/22 0152 02/26/22 0210 02/27/22 0403  BUN 58* 47* 27* '23 15 9 10 '$ 7* 8 5*  CREATININE 4.88* 3.44* 2.44* 2.07* 1.86* 1.70* 1.74* 1.63* 1.37* 1.31*  -Discontinue IV fluid -Encourage oral intake  ABLA superimposed on anemia of chronic disease: H&H stable.  Anemia panel without significant finding. Recent Labs    02/17/22 1524 02/18/22 0031 02/19/22 0355 02/20/22 1125 02/21/22 0315 02/22/22 0314 02/23/22 4097 02/24/22 0237 02/25/22 0152 02/27/22 0403  HGB 9.5* 8.9* 9.5*  9.0* 8.7* 9.1* 10.0* 9.2* 10.6* 10.6*  -Monitor H&H.  Clostridioides difficile infection: C. difficile toxin is negative but antigen and PCR positive. She was on ceftriaxone for possible UTI.  Diarrhea seems to have resolved. -Ceftriaxone discontinued.  -Continue p.o. 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  No cardiopulmonary symptoms.  Appears euvolemic on exam.  On p.o. Lasix 40 mg daily at home. -Continue holding diuretics -Monitor intake and output, daily weight, renal functions and electrolytes -Increased Coreg to 25 mg twice daily due to blood pressure and tachycardia. -Continue holding losartan  Uncontrolled hypertension.  She also had mild tachycardia. -Continue Coreg 25 mg twice daily  Uncontrolled IDDM-2 with hyperglycemia and CKD-3B: A1c 7.3%. Recent Labs  Lab 02/27/22 1215 02/27/22 1600 02/27/22 2103 02/28/22 1153 02/28/22 1525  GLUCAP 157* 131* 129* 96 162*  -Continue holding basal insulin. -Continue SSI-sensitive  Urinary tract infection: Patient with neurogenic bladder and intermittent self-catheterization.  She is at risk for UTI.  UA with bacteria and pyuria.  Surprisingly urine culture is negative.  -Discontinued ceftriaxone given  C. difficile.  Seems she already had 4 doses  Metabolic acidosis -P.o. sodium bicarbonate 650 mg 3 times daily  Hypokalemia/hypophosphatemia/hypomagnesemia:  -Monitor and replenish as appropriate.  Thrombocytopenia ([place]): Resolved.  Essential hypertension, benign: BP slightly elevated -Continue p.o. Coreg 6.25 mg twice daily.  Chronic lower back pain -Continue Tylenol and oxycodone as needed  History of CVA: No focal neuro symptoms but limited exam.  CT head without acute finding. -Continue home medications  Anxiety and depression: -Zoloft and as needed Ativan as above  Lactic acidosis:  Resolved.  Rhabdomyolysis: Resolved.  DVT prophylaxis:  heparin injection 5,000 Units Start: 02/18/22 2200 SCDs Start: 02/17/22 1249 Place TED hose Start: 02/17/22 1249  Code Status: Full code Family Communication: Attempted to call patient's sister but no answer. Level of care: Med-Surg.  Change level of care to MedSurg. Status is: Inpatient Remains inpatient appropriate because: Delirium   Final disposition: TBD Consultants:  Pulmonology-signed off Plastic surgery-signed off   Sch Meds:  Scheduled Meds:  buPROPion  300 mg Oral Daily   carvedilol  25 mg Oral BID WC   Chlorhexidine Gluconate Cloth  6 each Topical Daily   folic acid  1 mg Oral Daily   Gerhardt's butt cream   Topical BID   heparin  5,000 Units Subcutaneous Q8H   insulin aspart  0-5 Units Subcutaneous QHS   insulin aspart  0-9 Units Subcutaneous TID WC   multivitamin with minerals  1 tablet Oral Daily   pantoprazole  40 mg Oral Daily   rosuvastatin  20 mg Oral Daily   sertraline  100 mg Oral Daily   sodium bicarbonate  650 mg Oral TID   thiamine  100 mg Oral Daily   vancomycin  125 mg Oral QID   Continuous Infusions:  sodium chloride Stopped (02/17/22 1857)   PRN Meds:.acetaminophen **OR** acetaminophen, bisacodyl, LORazepam, ondansetron **OR** ondansetron (ZOFRAN) IV, oxyCODONE  Antimicrobials: Anti-infectives (From admission, onward)    Start     Dose/Rate Route Frequency Ordered Stop   02/20/22 1415  vancomycin (VANCOCIN) capsule 125 mg        125 mg Oral 4 times daily 02/20/22 1321 03/02/22 1359   02/18/22 0600  ceFAZolin (ANCEF) IVPB 2g/100 mL premix  Status:  Discontinued        2 g 200 mL/hr over 30 Minutes Intravenous On call to O.R. 02/17/22 1835 02/17/22 1955   02/17/22 1830  cefTRIAXone (ROCEPHIN) 2 g in sodium chloride 0.9 % 100 mL IVPB  Status:  Discontinued        2 g 200 mL/hr over 30 Minutes Intravenous Every 24 hours 02/17/22 1828 02/20/22 1316   02/17/22 1708  ceFAZolin 1 g /  gentamicin 80 mg in NS 500 mL surgical irrigation  Status:  Discontinued          As needed 02/17/22 1709 02/17/22 1824   02/17/22 0800  ceFAZolin (ANCEF) IVPB 2g/100 mL premix        2 g 200 mL/hr over 30 Minutes Intravenous  Once 02/17/22 0757 02/17/22 0851        I have personally reviewed the following labs and images: CBC: Recent Labs  Lab 02/22/22 0314 02/23/22 0635 02/24/22 0237 02/25/22 0152 02/27/22 0403  WBC 5.8 8.4 7.9 7.6 7.9  HGB 9.1* 10.0* 9.2* 10.6* 10.6*  HCT 27.9* 30.0* 28.9* 33.8* 33.0*  MCV 93.9 93.5 95.7 96.6 95.4  PLT 185 230 250 247 280   BMP &GFR Recent Labs  Lab 02/23/22 0635 02/24/22 0237 02/25/22  8832 02/26/22 0210 02/27/22 0403  NA 140 139 141 139 141  K 3.9 3.7 3.8 3.8 3.5  CL 108 108 110 107 111  CO2 20* 18* 20* 21* 21*  GLUCOSE 109* 115* 96 109* 109*  BUN 9 10 7* 8 5*  CREATININE 1.70* 1.74* 1.63* 1.37* 1.31*  CALCIUM 8.5* 8.4* 8.6* 8.8* 8.8*  MG 1.8 1.9 2.1 2.0 2.3  PHOS 4.3 4.4 3.3 3.1 3.2   Estimated Creatinine Clearance: 33.5 mL/min (A) (by C-G formula based on SCr of 1.31 mg/dL (H)). Liver & Pancreas: Recent Labs  Lab 02/22/22 0314 02/23/22 0635 02/24/22 0237 02/25/22 0152 02/26/22 0210 02/27/22 0403  AST 42*  --   --  32  --   --   ALT 31  --   --  25  --   --   ALKPHOS 106  --   --  104  --   --   BILITOT 0.6  --   --  0.6  --   --   PROT 5.6*  --   --  6.7  --   --   ALBUMIN 2.6* 2.7* 2.6* 2.9* 2.9* 3.0*   No results for input(s): "LIPASE", "AMYLASE" in the last 168 hours. Recent Labs  Lab 02/26/22 0210  AMMONIA 21   Diabetic: No results for input(s): "HGBA1C" in the last 72 hours.  Recent Labs  Lab 02/27/22 1215 02/27/22 1600 02/27/22 2103 02/28/22 1153 02/28/22 1525  GLUCAP 157* 131* 129* 96 162*   Cardiac Enzymes: Recent Labs  Lab 02/22/22 0314 02/23/22 0635  CKTOTAL 170 178   No results for input(s): "PROBNP" in the last 8760 hours. Coagulation Profile: No results for input(s): "INR",  "PROTIME" in the last 168 hours.  Thyroid Function Tests: No results for input(s): "TSH", "T4TOTAL", "FREET4", "T3FREE", "THYROIDAB" in the last 72 hours. Lipid Profile: No results for input(s): "CHOL", "HDL", "LDLCALC", "TRIG", "CHOLHDL", "LDLDIRECT" in the last 72 hours. Anemia Panel: No results for input(s): "VITAMINB12", "FOLATE", "FERRITIN", "TIBC", "IRON", "RETICCTPCT" in the last 72 hours.  Urine analysis:    Component Value Date/Time   COLORURINE YELLOW 02/17/2022 0756   APPEARANCEUR HAZY (A) 02/17/2022 0756   APPEARANCEUR Clear 02/05/2016 1300   LABSPEC 1.012 02/17/2022 0756   PHURINE 5.0 02/17/2022 0756   GLUCOSEU NEGATIVE 02/17/2022 0756   HGBUR MODERATE (A) 02/17/2022 0756   BILIRUBINUR NEGATIVE 02/17/2022 0756   BILIRUBINUR negative 09/20/2019 1545   BILIRUBINUR Negative 02/05/2016 1300   KETONESUR NEGATIVE 02/17/2022 0756   PROTEINUR 100 (A) 02/17/2022 0756   UROBILINOGEN 0.2 09/20/2019 1545   UROBILINOGEN 0.2 08/13/2014 1332   NITRITE NEGATIVE 02/17/2022 0756   LEUKOCYTESUR LARGE (A) 02/17/2022 0756   Sepsis Labs: Invalid input(s): "PROCALCITONIN", "LACTICIDVEN"  Microbiology: No results found for this or any previous visit (from the past 240 hour(s)).   Radiology Studies: No results found.    Dylanie Quesenberry T. [place]  If 7PM-7AM, please contact night-coverage [email or web address] 02/28/2022, 5:16 PM

## 2022-02-28 NOTE — Plan of Care (Signed)
Problem: Education: Goal: Ability to describe self-care measures that may prevent or decrease complications (Diabetes Survival Skills Education) will improve 02/28/2022 1538 by Colon Flattery, RN Outcome: Progressing 02/28/2022 1235 by Colon Flattery, RN Outcome: Progressing 02/28/2022 1232 by Colon Flattery, RN Outcome: Progressing 02/28/2022 1232 by Colon Flattery, RN Outcome: Progressing Goal: Individualized Educational Video(s) 02/28/2022 1538 by Colon Flattery, RN Outcome: Progressing 02/28/2022 1235 by Colon Flattery, RN Outcome: Progressing 02/28/2022 1232 by Colon Flattery, RN Outcome: Progressing 02/28/2022 1232 by Colon Flattery, RN Outcome: Progressing   Problem: Coping: Goal: Ability to adjust to condition or change in health will improve 02/28/2022 1538 by Colon Flattery, RN Outcome: Progressing 02/28/2022 1235 by Colon Flattery, RN Outcome: Progressing 02/28/2022 1232 by Colon Flattery, RN Outcome: Progressing 02/28/2022 1232 by Colon Flattery, RN Outcome: Progressing   Problem: Fluid Volume: Goal: Ability to maintain a balanced intake and output will improve 02/28/2022 1538 by Colon Flattery, RN Outcome: Progressing 02/28/2022 1235 by Colon Flattery, RN Outcome: Progressing 02/28/2022 1232 by Colon Flattery, RN Outcome: Progressing 02/28/2022 1232 by Colon Flattery, RN Outcome: Progressing   Problem: Health Behavior/Discharge Planning: Goal: Ability to identify and utilize available resources and services will improve 02/28/2022 1538 by Colon Flattery, RN Outcome: Progressing 02/28/2022 1235 by Colon Flattery, RN Outcome: Progressing 02/28/2022 1232 by Colon Flattery, RN Outcome: Progressing 02/28/2022 1232 by Colon Flattery, RN Outcome: Progressing Goal: Ability to manage health-related needs will improve 02/28/2022 1538 by Colon Flattery, RN Outcome: Progressing 02/28/2022 1235 by Colon Flattery, RN Outcome: Progressing 02/28/2022 1232 by Colon Flattery,  RN Outcome: Progressing 02/28/2022 1232 by Colon Flattery, RN Outcome: Progressing   Problem: Metabolic: Goal: Ability to maintain appropriate glucose levels will improve 02/28/2022 1538 by Colon Flattery, RN Outcome: Progressing 02/28/2022 1235 by Colon Flattery, RN Outcome: Progressing 02/28/2022 1232 by Colon Flattery, RN Outcome: Progressing 02/28/2022 1232 by Colon Flattery, RN Outcome: Progressing   Problem: Nutritional: Goal: Maintenance of adequate nutrition will improve 02/28/2022 1538 by Colon Flattery, RN Outcome: Progressing 02/28/2022 1235 by Colon Flattery, RN Outcome: Progressing 02/28/2022 1232 by Colon Flattery, RN Outcome: Progressing 02/28/2022 1232 by Colon Flattery, RN Outcome: Progressing Goal: Progress toward achieving an optimal weight will improve 02/28/2022 1538 by Colon Flattery, RN Outcome: Progressing 02/28/2022 1235 by Colon Flattery, RN Outcome: Progressing 02/28/2022 1232 by Colon Flattery, RN Outcome: Progressing 02/28/2022 1232 by Colon Flattery, RN Outcome: Progressing   Problem: Skin Integrity: Goal: Risk for impaired skin integrity will decrease 02/28/2022 1538 by Colon Flattery, RN Outcome: Progressing 02/28/2022 1235 by Colon Flattery, RN Outcome: Progressing 02/28/2022 1232 by Colon Flattery, RN Outcome: Progressing 02/28/2022 1232 by Colon Flattery, RN Outcome: Progressing   Problem: Tissue Perfusion: Goal: Adequacy of tissue perfusion will improve 02/28/2022 1538 by Colon Flattery, RN Outcome: Progressing 02/28/2022 1235 by Colon Flattery, RN Outcome: Progressing 02/28/2022 1232 by Colon Flattery, RN Outcome: Progressing 02/28/2022 1232 by Colon Flattery, RN Outcome: Progressing   Problem: Safety: Goal: Non-violent Restraint(s) 02/28/2022 1538 by Colon Flattery, RN Outcome: Progressing 02/28/2022 1235 by Colon Flattery, RN Outcome: Progressing 02/28/2022 1232 by Colon Flattery, RN Outcome: Progressing 02/28/2022 1232 by Colon Flattery, RN Outcome: Progressing   Problem: Education: Goal: Knowledge of General Education information will improve Description: Including pain rating scale, medication(s)/side effects and non-pharmacologic comfort measures 02/28/2022 1538 by Colon Flattery, RN Outcome: Progressing 02/28/2022 1235 by Colon Flattery, RN Outcome: Progressing 02/28/2022 1232 by Colon Flattery, RN Outcome: Progressing 02/28/2022 1232 by Colon Flattery, RN Outcome: Progressing   Problem: Health Behavior/Discharge Planning: Goal: Ability to manage health-related needs  will improve 02/28/2022 1538 by Colon Flattery, RN Outcome: Progressing 02/28/2022 1235 by Colon Flattery, RN Outcome: Progressing 02/28/2022 1232 by Colon Flattery, RN Outcome: Progressing 02/28/2022 1232 by Colon Flattery, RN Outcome: Progressing   Problem: Clinical Measurements: Goal: Ability to maintain clinical measurements within normal limits will improve 02/28/2022 1538 by Colon Flattery, RN Outcome: Progressing 02/28/2022 1235 by Colon Flattery, RN Outcome: Progressing 02/28/2022 1232 by Colon Flattery, RN Outcome: Progressing 02/28/2022 1232 by Colon Flattery, RN Outcome: Progressing Goal: Will remain free from infection 02/28/2022 1538 by Colon Flattery, RN Outcome: Progressing 02/28/2022 1235 by Colon Flattery, RN Outcome: Progressing 02/28/2022 1232 by Colon Flattery, RN Outcome: Progressing 02/28/2022 1232 by Colon Flattery, RN Outcome: Progressing Goal: Diagnostic test results will improve 02/28/2022 1538 by Colon Flattery, RN Outcome: Progressing 02/28/2022 1235 by Colon Flattery, RN Outcome: Progressing 02/28/2022 1232 by Colon Flattery, RN Outcome: Progressing 02/28/2022 1232 by Colon Flattery, RN Outcome: Progressing Goal: Respiratory complications will improve 02/28/2022 1538 by Colon Flattery, RN Outcome: Progressing 02/28/2022 1235 by Colon Flattery, RN Outcome: Progressing 02/28/2022 1232 by Colon Flattery,  RN Outcome: Progressing 02/28/2022 1232 by Colon Flattery, RN Outcome: Progressing Goal: Cardiovascular complication will be avoided 02/28/2022 1538 by Colon Flattery, RN Outcome: Progressing 02/28/2022 1235 by Colon Flattery, RN Outcome: Progressing 02/28/2022 1232 by Colon Flattery, RN Outcome: Progressing 02/28/2022 1232 by Colon Flattery, RN Outcome: Progressing   Problem: Activity: Goal: Risk for activity intolerance will decrease 02/28/2022 1538 by Colon Flattery, RN Outcome: Progressing 02/28/2022 1235 by Colon Flattery, RN Outcome: Progressing 02/28/2022 1232 by Colon Flattery, RN Outcome: Progressing 02/28/2022 1232 by Colon Flattery, RN Outcome: Progressing   Problem: Nutrition: Goal: Adequate nutrition will be maintained 02/28/2022 1538 by Colon Flattery, RN Outcome: Progressing 02/28/2022 1235 by Colon Flattery, RN Outcome: Progressing 02/28/2022 1232 by Colon Flattery, RN Outcome: Progressing 02/28/2022 1232 by Colon Flattery, RN Outcome: Progressing   Problem: Coping: Goal: Level of anxiety will decrease 02/28/2022 1538 by Colon Flattery, RN Outcome: Progressing 02/28/2022 1235 by Colon Flattery, RN Outcome: Progressing 02/28/2022 1232 by Colon Flattery, RN Outcome: Progressing 02/28/2022 1232 by Colon Flattery, RN Outcome: Progressing   Problem: Elimination: Goal: Will not experience complications related to bowel motility 02/28/2022 1538 by Colon Flattery, RN Outcome: Progressing 02/28/2022 1235 by Colon Flattery, RN Outcome: Progressing 02/28/2022 1232 by Colon Flattery, RN Outcome: Progressing 02/28/2022 1232 by Colon Flattery, RN Outcome: Progressing Goal: Will not experience complications related to urinary retention 02/28/2022 1538 by Colon Flattery, RN Outcome: Progressing 02/28/2022 1235 by Colon Flattery, RN Outcome: Progressing 02/28/2022 1232 by Colon Flattery, RN Outcome: Progressing 02/28/2022 1232 by Colon Flattery, RN Outcome: Progressing    Problem: Pain Managment: Goal: General experience of comfort will improve 02/28/2022 1538 by Colon Flattery, RN Outcome: Progressing 02/28/2022 1235 by Colon Flattery, RN Outcome: Progressing 02/28/2022 1232 by Colon Flattery, RN Outcome: Progressing 02/28/2022 1232 by Colon Flattery, RN Outcome: Progressing   Problem: Safety: Goal: Ability to remain free from injury will improve 02/28/2022 1538 by Colon Flattery, RN Outcome: Progressing 02/28/2022 1235 by Colon Flattery, RN Outcome: Progressing 02/28/2022 1232 by Colon Flattery, RN Outcome: Progressing 02/28/2022 1232 by Colon Flattery, RN Outcome: Progressing   Problem: Skin Integrity: Goal: Risk for impaired skin integrity will decrease 02/28/2022 1538 by Colon Flattery, RN Outcome: Progressing 02/28/2022 1235 by Colon Flattery, RN Outcome: Progressing 02/28/2022 1232 by Colon Flattery, RN Outcome: Progressing 02/28/2022 1232 by Colon Flattery, RN Outcome: Progressing

## 2022-02-28 NOTE — Progress Notes (Signed)
Patient remains on bilateral soft restraints and posey/ Patient continues to be restless, making poor judgements, shouting out, and unable to be directed.  Will continue monitoring/

## 2022-02-28 NOTE — Plan of Care (Signed)
Problem: Education: Goal: Ability to describe self-care measures that may prevent or decrease complications (Diabetes Survival Skills Education) will improve 02/28/2022 1235 by Colon Flattery, RN Outcome: Progressing 02/28/2022 1232 by Colon Flattery, RN Outcome: Progressing 02/28/2022 1232 by Colon Flattery, RN Outcome: Progressing Goal: Individualized Educational Video(s) 02/28/2022 1235 by Colon Flattery, RN Outcome: Progressing 02/28/2022 1232 by Colon Flattery, RN Outcome: Progressing 02/28/2022 1232 by Colon Flattery, RN Outcome: Progressing   Problem: Coping: Goal: Ability to adjust to condition or change in health will improve 02/28/2022 1235 by Colon Flattery, RN Outcome: Progressing 02/28/2022 1232 by Colon Flattery, RN Outcome: Progressing 02/28/2022 1232 by Colon Flattery, RN Outcome: Progressing   Problem: Fluid Volume: Goal: Ability to maintain a balanced intake and output will improve 02/28/2022 1235 by Colon Flattery, RN Outcome: Progressing 02/28/2022 1232 by Colon Flattery, RN Outcome: Progressing 02/28/2022 1232 by Colon Flattery, RN Outcome: Progressing   Problem: Health Behavior/Discharge Planning: Goal: Ability to identify and utilize available resources and services will improve 02/28/2022 1235 by Colon Flattery, RN Outcome: Progressing 02/28/2022 1232 by Colon Flattery, RN Outcome: Progressing 02/28/2022 1232 by Colon Flattery, RN Outcome: Progressing Goal: Ability to manage health-related needs will improve 02/28/2022 1235 by Colon Flattery, RN Outcome: Progressing 02/28/2022 1232 by Colon Flattery, RN Outcome: Progressing 02/28/2022 1232 by Colon Flattery, RN Outcome: Progressing   Problem: Metabolic: Goal: Ability to maintain appropriate glucose levels will improve 02/28/2022 1235 by Colon Flattery, RN Outcome: Progressing 02/28/2022 1232 by Colon Flattery, RN Outcome: Progressing 02/28/2022 1232 by Colon Flattery, RN Outcome: Progressing   Problem:  Nutritional: Goal: Maintenance of adequate nutrition will improve 02/28/2022 1235 by Colon Flattery, RN Outcome: Progressing 02/28/2022 1232 by Colon Flattery, RN Outcome: Progressing 02/28/2022 1232 by Colon Flattery, RN Outcome: Progressing Goal: Progress toward achieving an optimal weight will improve 02/28/2022 1235 by Colon Flattery, RN Outcome: Progressing 02/28/2022 1232 by Colon Flattery, RN Outcome: Progressing 02/28/2022 1232 by Colon Flattery, RN Outcome: Progressing   Problem: Skin Integrity: Goal: Risk for impaired skin integrity will decrease 02/28/2022 1235 by Colon Flattery, RN Outcome: Progressing 02/28/2022 1232 by Colon Flattery, RN Outcome: Progressing 02/28/2022 1232 by Colon Flattery, RN Outcome: Progressing   Problem: Tissue Perfusion: Goal: Adequacy of tissue perfusion will improve 02/28/2022 1235 by Colon Flattery, RN Outcome: Progressing 02/28/2022 1232 by Colon Flattery, RN Outcome: Progressing 02/28/2022 1232 by Colon Flattery, RN Outcome: Progressing   Problem: Safety: Goal: Non-violent Restraint(s) 02/28/2022 1235 by Colon Flattery, RN Outcome: Progressing 02/28/2022 1232 by Colon Flattery, RN Outcome: Progressing 02/28/2022 1232 by Colon Flattery, RN Outcome: Progressing   Problem: Education: Goal: Knowledge of General Education information will improve Description: Including pain rating scale, medication(s)/side effects and non-pharmacologic comfort measures 02/28/2022 1235 by Colon Flattery, RN Outcome: Progressing 02/28/2022 1232 by Colon Flattery, RN Outcome: Progressing 02/28/2022 1232 by Colon Flattery, RN Outcome: Progressing   Problem: Health Behavior/Discharge Planning: Goal: Ability to manage health-related needs will improve 02/28/2022 1235 by Colon Flattery, RN Outcome: Progressing 02/28/2022 1232 by Colon Flattery, RN Outcome: Progressing 02/28/2022 1232 by Colon Flattery, RN Outcome: Progressing   Problem: Clinical  Measurements: Goal: Ability to maintain clinical measurements within normal limits will improve 02/28/2022 1235 by Colon Flattery, RN Outcome: Progressing 02/28/2022 1232 by Colon Flattery, RN Outcome: Progressing 02/28/2022 1232 by Colon Flattery, RN Outcome: Progressing Goal: Will remain free from infection 02/28/2022 1235 by Colon Flattery, RN Outcome: Progressing 02/28/2022 1232 by Colon Flattery, RN Outcome: Progressing 02/28/2022 1232 by Colon Flattery, RN Outcome: Progressing Goal: Diagnostic test results will improve 02/28/2022 1235  by Colon Flattery, RN Outcome: Progressing 02/28/2022 1232 by Colon Flattery, RN Outcome: Progressing 02/28/2022 1232 by Colon Flattery, RN Outcome: Progressing Goal: Respiratory complications will improve 02/28/2022 1235 by Colon Flattery, RN Outcome: Progressing 02/28/2022 1232 by Colon Flattery, RN Outcome: Progressing 02/28/2022 1232 by Colon Flattery, RN Outcome: Progressing Goal: Cardiovascular complication will be avoided 02/28/2022 1235 by Colon Flattery, RN Outcome: Progressing 02/28/2022 1232 by Colon Flattery, RN Outcome: Progressing 02/28/2022 1232 by Colon Flattery, RN Outcome: Progressing   Problem: Activity: Goal: Risk for activity intolerance will decrease 02/28/2022 1235 by Colon Flattery, RN Outcome: Progressing 02/28/2022 1232 by Colon Flattery, RN Outcome: Progressing 02/28/2022 1232 by Colon Flattery, RN Outcome: Progressing   Problem: Nutrition: Goal: Adequate nutrition will be maintained 02/28/2022 1235 by Colon Flattery, RN Outcome: Progressing 02/28/2022 1232 by Colon Flattery, RN Outcome: Progressing 02/28/2022 1232 by Colon Flattery, RN Outcome: Progressing   Problem: Coping: Goal: Level of anxiety will decrease 02/28/2022 1235 by Colon Flattery, RN Outcome: Progressing 02/28/2022 1232 by Colon Flattery, RN Outcome: Progressing 02/28/2022 1232 by Colon Flattery, RN Outcome: Progressing   Problem:  Elimination: Goal: Will not experience complications related to bowel motility 02/28/2022 1235 by Colon Flattery, RN Outcome: Progressing 02/28/2022 1232 by Colon Flattery, RN Outcome: Progressing 02/28/2022 1232 by Colon Flattery, RN Outcome: Progressing Goal: Will not experience complications related to urinary retention 02/28/2022 1235 by Colon Flattery, RN Outcome: Progressing 02/28/2022 1232 by Colon Flattery, RN Outcome: Progressing 02/28/2022 1232 by Colon Flattery, RN Outcome: Progressing   Problem: Pain Managment: Goal: General experience of comfort will improve 02/28/2022 1235 by Colon Flattery, RN Outcome: Progressing 02/28/2022 1232 by Colon Flattery, RN Outcome: Progressing 02/28/2022 1232 by Colon Flattery, RN Outcome: Progressing   Problem: Safety: Goal: Ability to remain free from injury will improve 02/28/2022 1235 by Colon Flattery, RN Outcome: Progressing 02/28/2022 1232 by Colon Flattery, RN Outcome: Progressing 02/28/2022 1232 by Colon Flattery, RN Outcome: Progressing   Problem: Skin Integrity: Goal: Risk for impaired skin integrity will decrease 02/28/2022 1235 by Colon Flattery, RN Outcome: Progressing 02/28/2022 1232 by Colon Flattery, RN Outcome: Progressing 02/28/2022 1232 by Colon Flattery, RN Outcome: Progressing

## 2022-02-28 NOTE — Plan of Care (Signed)
  Problem: Education: Goal: Ability to describe self-care measures that may prevent or decrease complications (Diabetes Survival Skills Education) will improve Outcome: Progressing Goal: Individualized Educational Video(s) Outcome: Progressing   Problem: Coping: Goal: Ability to adjust to condition or change in health will improve Outcome: Progressing   Problem: Fluid Volume: Goal: Ability to maintain a balanced intake and output will improve Outcome: Progressing   Problem: Health Behavior/Discharge Planning: Goal: Ability to identify and utilize available resources and services will improve Outcome: Progressing Goal: Ability to manage health-related needs will improve Outcome: Progressing   Problem: Metabolic: Goal: Ability to maintain appropriate glucose levels will improve Outcome: Progressing   Problem: Nutritional: Goal: Maintenance of adequate nutrition will improve Outcome: Progressing Goal: Progress toward achieving an optimal weight will improve Outcome: Progressing   Problem: Skin Integrity: Goal: Risk for impaired skin integrity will decrease Outcome: Progressing   Problem: Tissue Perfusion: Goal: Adequacy of tissue perfusion will improve Outcome: Progressing   Problem: Safety: Goal: Non-violent Restraint(s) Outcome: Progressing   Problem: Education: Goal: Knowledge of General Education information will improve Description: Including pain rating scale, medication(s)/side effects and non-pharmacologic comfort measures Outcome: Progressing   Problem: Health Behavior/Discharge Planning: Goal: Ability to manage health-related needs will improve Outcome: Progressing   Problem: Clinical Measurements: Goal: Ability to maintain clinical measurements within normal limits will improve Outcome: Progressing Goal: Will remain free from infection Outcome: Progressing Goal: Diagnostic test results will improve Outcome: Progressing Goal: Respiratory complications  will improve Outcome: Progressing Goal: Cardiovascular complication will be avoided Outcome: Progressing   Problem: Activity: Goal: Risk for activity intolerance will decrease Outcome: Progressing   Problem: Nutrition: Goal: Adequate nutrition will be maintained Outcome: Progressing   Problem: Coping: Goal: Level of anxiety will decrease Outcome: Progressing   Problem: Elimination: Goal: Will not experience complications related to bowel motility Outcome: Progressing Goal: Will not experience complications related to urinary retention Outcome: Progressing   Problem: Pain Managment: Goal: General experience of comfort will improve Outcome: Progressing   Problem: Safety: Goal: Ability to remain free from injury will improve Outcome: Progressing   Problem: Skin Integrity: Goal: Risk for impaired skin integrity will decrease Outcome: Progressing   

## 2022-03-01 DIAGNOSIS — D62 Acute posthemorrhagic anemia: Secondary | ICD-10-CM | POA: Diagnosis not present

## 2022-03-01 DIAGNOSIS — N179 Acute kidney failure, unspecified: Secondary | ICD-10-CM | POA: Diagnosis not present

## 2022-03-01 DIAGNOSIS — F1010 Alcohol abuse, uncomplicated: Secondary | ICD-10-CM

## 2022-03-01 DIAGNOSIS — S0993XA Unspecified injury of face, initial encounter: Secondary | ICD-10-CM | POA: Diagnosis not present

## 2022-03-01 DIAGNOSIS — G9341 Metabolic encephalopathy: Secondary | ICD-10-CM | POA: Diagnosis not present

## 2022-03-01 LAB — GLUCOSE, CAPILLARY
Glucose-Capillary: 219 mg/dL — ABNORMAL HIGH (ref 70–99)
Glucose-Capillary: 222 mg/dL — ABNORMAL HIGH (ref 70–99)
Glucose-Capillary: 134 mg/dL — ABNORMAL HIGH (ref 70–99)

## 2022-03-01 NOTE — Progress Notes (Signed)
Patient remains confused, restless, agitated, pulling on medical equipment. Patient pulled her foley out, MD notified. MD ordered bladder scan Q8hr. PRN anxiety administered twice to no avail.

## 2022-03-01 NOTE — Progress Notes (Signed)
[facility]                                                                               [name], is a 71 y.o. female, DOB - 1951/07/30, CXK:[id number] Admit date - 02/17/2022    Outpatient Primary MD for the patient is [place] Crane, Edwinna Areola, MD  LOS - 12  days    Brief summary   71 year old F with PMH of EtOH abuse, combined CHF s/p BIV PPM, CVA, DM-2, CKD-3B, neurogenic bladder with self-catheterization, chronic pain, GERD, anxiety, insomnia and depression presenting to [facility] after a fall at home the night of 6/5-6 hitting her head against a dresser while wearing her glasses.  Denies LOC.  Reportedly, "bled everywhere and cut the flap of skin off at home".  In ED, she was hypotensive to 74/44. Cr 5.6.  BUN 67.  Lactate 2.1.  Head CT negative for acute finding.  She was started on regular IV fluid and vasopressors and admitted to ICU for hypovolemic shock, AKI and acute blood loss anemia.  Patient had laceration repair by plastic surgery.  She came off vasopressors.  She is transferred to hospitalist service on 6/9.  She went into alcohol withdrawal with delirium.  PCCM was consulted and started on phenobarbital and Librium taper, Depakote and Ativan.  Patient completed phenobarbital and Librium tapers but remains confused, agitated requiring soft restraints.  Overnight patient pulled out Foley catheter and remains confused and slightly agitated.   Assessment & Plan    Assessment and Plan:   Alcohol withdrawal syndrome/acute toxic and metabolic encephalopathy with visual hallucinations. Patient was started on phenobarbital Librium taper, Depakote and Ativan by PCCM. Completed high-dose thiamine, phenobarbital and Librium taper and Depakote was discontinued on 16 June. Decreased home Zoloft at 100 mg daily.  Reorientation and delirium precautions. Pt confused this am, restless, and overnight removed the foley catheter and rectal tube.  We will consult psychiatry if no  improvement.   * Facial trauma due to fall at home In the setting of fall.  CT head without acute finding. -S/p complex laceration repair on 6/8 by plastic surgery. -Bactroban to cheek daily, Erythromycin oph oint to left eye daily, KY gel to lateral nose daily and outpatient follow-up in 10 days Outpatient follow up with plastic surgery in 10 days. Therapy evaluations to continue.  Hypovolemic shock (HCC) Likely hypovolemic in the setting of dehydration, alcohol abuse and acute blood loss.  Resolved.  TTE with LVEF of 55 to 60%, G1 DD, normal RV/PASP and no WMA.  IV fluids to continue due to poor oral intake in the setting of encephalopathy and alcohol withdrawal.     AKI (acute kidney injury) ([place]) Patient was admitted with a creatinine of 5.5, patient's baseline creatinine around 1.3-1.5.  Creatinine improved with IV fluids. Continue to monitor.   Acute blood loss anemia superimposed on anemia of chronic disease Recent Labs    02/17/22 0756 02/17/22 0808 02/17/22 1524 02/18/22 0031 02/19/22 0355 02/20/22 1125  HGB 10.8* 11.2* 9.5* 8.9* 9.5* 9.0*  H&H stable after initial drop.  It seems she bled from the facial laceration before arrival.     Clostridioides difficile  infection C. difficile toxin is negative but antigen and PCR positive.  Per RN, she is having constant liquid stool leakage.  No significant abdominal tenderness, fever or leukocytosis.  Continue oral vancomycin for 10 days to complete the course.  Flexi-Seal was discontinued  Chronic combined CHF s/p BIV PPM now with recovered EF TTE with LVEF of 55 to 60%, G1 DD, no WMA.  No cardiopulmonary symptoms.  Appears euvolemic on exam.  On p.o. Lasix 40 mg daily at home. -Continue holding diuretics in the setting of AKI -Monitor intake and output, daily weight, renal functions and electrolytes -Resume home Coreg if able to take p.o.  Otherwise, IV metoprolol 5 mg every 6 hours -Continue holding losartan for  AKI  Uncontrolled IDDM-2 with hyperglycemia and CKD-3B A1c 7.3%. Recent Labs  Lab 02/19/22 1115 02/19/22 1558 02/19/22 2129 02/20/22 0851 02/20/22 1139  GLUCAP 172* 115* 162* 137* 131*  -Continue Semglee 10 units daily Continue with sensitive scale insulin   Urinary tract infection Patient with neurogenic bladder and intermittent self-catheterization.  She is at risk for UTI.  UA with bacteria and pyuria.  Surprisingly urine culture is negative.  -Discontinue ceftriaxone given C. difficile.  She completed 4 days of IV Rocephin.  Lactic acidosis Resolved.  Rhabdomyolysis Resolved.  Hypokalemia/hypophosphatemia/hypomagnesemia Replace as appropriate  Thrombocytopenia ([place]) Likely due to alcohol abuse.  Improving.  Uncontrolled hypertension BP parameters are optimal.  -Resume home Coreg  Chronic lower back pain Continue Tylenol and oxycodone as needed  Cerebral vascular accident ([place]) No focal neuro symptoms but limited exam due to mental status.   CT head without acute finding. -Continue home medications when able to take p.o.   Anxiety and depression Stable.      Estimated body mass index is 24.66 kg/m as calculated from the following:   Height as of this encounter: '5\' 1"'$  (1.549 m).   Weight as of this encounter: 59.2 kg.  Code Status: full code.  DVT Prophylaxis:  heparin injection 5,000 Units Start: 02/18/22 2200 SCDs Start: 02/17/22 1249 Place TED hose Start: 02/17/22 1249   Level of Care: Level of care: Med-Surg Family Communication: none at bedside.   Disposition Plan:     Remains inpatient appropriate:  agitated and on  soft restraints. Procedures:  None  Consultants:   PCCM signed off Plastic surgery signed off  Antimicrobials:   Anti-infectives (From admission, onward)    Start     Dose/Rate Route Frequency Ordered Stop   02/20/22 1415  vancomycin (VANCOCIN) capsule 125 mg        125 mg Oral 4 times daily 02/20/22 1321 03/02/22  1359   02/18/22 0600  ceFAZolin (ANCEF) IVPB 2g/100 mL premix  Status:  Discontinued        2 g 200 mL/hr over 30 Minutes Intravenous On call to O.R. 02/17/22 1835 02/17/22 1955   02/17/22 1830  cefTRIAXone (ROCEPHIN) 2 g in sodium chloride 0.9 % 100 mL IVPB  Status:  Discontinued        2 g 200 mL/hr over 30 Minutes Intravenous Every 24 hours 02/17/22 1828 02/20/22 1316   02/17/22 1708  ceFAZolin 1 g / gentamicin 80 mg in NS 500 mL surgical irrigation  Status:  Discontinued          As needed 02/17/22 1709 02/17/22 1824   02/17/22 0800  ceFAZolin (ANCEF) IVPB 2g/100 mL premix        2 g 200 mL/hr over 30 Minutes Intravenous  Once 02/17/22 0757 02/17/22 0851  Medications  Scheduled Meds:  buPROPion  300 mg Oral Daily   carvedilol  25 mg Oral BID WC   Chlorhexidine Gluconate Cloth  6 each Topical Daily   folic acid  1 mg Oral Daily   Gerhardt's butt cream   Topical BID   heparin  5,000 Units Subcutaneous Q8H   insulin aspart  0-5 Units Subcutaneous QHS   insulin aspart  0-9 Units Subcutaneous TID WC   multivitamin with minerals  1 tablet Oral Daily   pantoprazole  40 mg Oral Daily   rosuvastatin  20 mg Oral Daily   sertraline  100 mg Oral Daily   sodium bicarbonate  650 mg Oral TID   thiamine  100 mg Oral Daily   vancomycin  125 mg Oral QID   Continuous Infusions:  sodium chloride Stopped (02/17/22 1857)   PRN Meds:.acetaminophen **OR** acetaminophen, bisacodyl, LORazepam, ondansetron **OR** ondansetron (ZOFRAN) IV, oxyCODONE    Subjective:   [name] was seen and examined today.  Agitated overnight, removed foley .   Objective:   Vitals:   02/28/22 1944 02/28/22 2323 03/01/22 0327 03/01/22 0500  BP: (!) 117/55 (!) 156/77 (!) 152/75   Pulse:      Resp: '20 20 20   '$ Temp: 97.6 F (36.4 C) 98.6 F (37 C) 98.2 F (36.8 C)   TempSrc: Oral Oral Oral   SpO2:      Weight:    59.2 kg  Height:        Intake/Output Summary (Last 24 hours) at 03/01/2022  0924 Last data filed at 03/01/2022 0028 Gross per 24 hour  Intake --  Output 1100 ml  Net -1100 ml   Filed Weights   02/26/22 0353 02/28/22 0500 03/01/22 0500  Weight: 59.2 kg 59.2 kg 59.2 kg     Exam General: confused,  Cardiovascular: S1 S2 auscultated, no murmurs, RRR Respiratory: Clear to auscultation bilaterally, no wheezing, rales or rhonchi Gastrointestinal: Soft, nontender, nondistended, + bowel sounds Ext: no pedal edema bilaterally Neuro: alert and oriented to self only. Able to move all extremities.  Skin: No rashes Psych: restless and agitated.    Data Reviewed:  I have personally reviewed following labs and imaging studies   CBC Lab Results  Component Value Date   WBC 7.9 02/27/2022   RBC 3.46 (L) 02/27/2022   HGB 10.6 (L) 02/27/2022   HCT 33.0 (L) 02/27/2022   MCV 95.4 02/27/2022   MCH 30.6 02/27/2022   PLT 280 02/27/2022   MCHC 32.1 02/27/2022   RDW 14.4 02/27/2022   LYMPHSABS 1.6 03/11/2020   MONOABS 0.6 03/11/2020   EOSABS 0.2 03/11/2020   BASOSABS 0.0 41/96/2229     Last metabolic panel Lab Results  Component Value Date   NA 141 02/27/2022   K 3.5 02/27/2022   CL 111 02/27/2022   CO2 21 (L) 02/27/2022   BUN 5 (L) 02/27/2022   CREATININE 1.31 (H) 02/27/2022   GLUCOSE 109 (H) 02/27/2022   GFRNONAA 44 (L) 02/27/2022   GFRAA 46 (L) 03/14/2020   CALCIUM 8.8 (L) 02/27/2022   PHOS 3.2 02/27/2022   PROT 6.7 02/25/2022   ALBUMIN 3.0 (L) 02/27/2022   LABGLOB 2.5 09/21/2019   AGRATIO 1.8 09/21/2019   BILITOT 0.6 02/25/2022   ALKPHOS 104 02/25/2022   AST 32 02/25/2022   ALT 25 02/25/2022   ANIONGAP 9 02/27/2022    CBG (last 3)  Recent Labs    02/28/22 1153 02/28/22 1525 02/28/22 2136  GLUCAP 96 162* 109*  Coagulation Profile: No results for input(s): "INR", "PROTIME" in the last 168 hours.   Radiology Studies: No results found.     Hosie Poisson M.D. [facility] 03/01/2022, 9:24 AM  Available via Epic secure  chat 7am-7pm After 7 pm, please refer to night coverage provider listed on amion.

## 2022-03-01 NOTE — Progress Notes (Signed)
Occupational Therapy Treatment Patient Details Name: [name] MRN: [id number] DOB: [birth year] Today's Date: 03/01/2022   History of present illness 71 y.o. female presents to [facility] on 02/17/2022 s/p fall, striking head on dresser. Pt underwent laceration repair on 6/6. 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   OT comments  Pt continues to not make functional progress to her goals, limited by confusion and withdrawal symptoms. Overall she required max A throughout for cues and physical assist to complete toileting and hygiene. She was seemingly hallucinating, but did have moments of clarity throughout. Pt continues to benefit. Re-assess d/c needs as pt's withdrawn symptoms improve.    Recommendations for follow up therapy are one component of a multi-disciplinary discharge planning process, led by the attending physician.  Recommendations may be updated based on patient status, additional functional criteria and insurance authorization.    Follow Up Recommendations  No OT follow up (will need to re-assess)    Assistance Recommended at Discharge Intermittent Supervision/Assistance  Patient can return home with the following  A little help with walking and/or transfers;A little help with bathing/dressing/bathroom;Assistance with cooking/housework;Assist for transportation;Help with stairs or ramp for entrance   Equipment Recommendations  None recommended by OT       Precautions / Restrictions Precautions Precautions: Fall;Other (comment) Precaution Comments: CIWA - belt restraint Restrictions Weight Bearing Restrictions: No       Mobility Bed Mobility Overal bed mobility: Needs Assistance Bed Mobility: Supine to Sit, Sit to Supine     Supine to sit: Mod assist Sit to supine: Mod assist        Transfers Overall transfer level: Needs assistance Equipment used: 1 person  hand held assist Transfers: Sit to/from Stand, Bed to chair/wheelchair/BSC Sit to Stand: Mod assist     Step pivot transfers: Max assist           Balance Overall balance assessment: Needs assistance Sitting-balance support: Feet supported Sitting balance-Leahy Scale: Poor Sitting balance - Comments: posterior LOB   Standing balance support: Bilateral upper extremity supported, During functional activity Standing balance-Leahy Scale: Poor                             ADL either performed or assessed with clinical judgement   ADL Overall ADL's : Needs assistance/impaired                         Toilet Transfer: Maximal assistance;Stand-pivot;BSC/3in1 Toilet Transfer Details (indicate cue type and reason): step pivot to [facility] Toileting- Clothing Manipulation and Hygiene: Maximal assistance;Sit to/from stand Toileting - Clothing Manipulation Details (indicate cue type and reason): max A for rear peri care after small BM     Functional mobility during ADLs: Maximal assistance General ADL Comments: pt stated she needed to use the bathroom, transferred to toilet but only had small abount of residual stool. required max physical assist and cues throughout    Extremity/Trunk Assessment Upper Extremity Assessment Upper Extremity Assessment: Generalized weakness   Lower Extremity Assessment Lower Extremity Assessment: Generalized weakness        Vision   Vision Assessment?: No apparent visual deficits Additional Comments: L eye covered   Perception Perception Perception: Not tested   Praxis Praxis Praxis: Not tested    Cognition Arousal/Alertness: Lethargic Behavior During Therapy: Flat affect, Impulsive Overall Cognitive Status: Difficult to assess Area of Impairment: Problem  solving, Safety/judgement, Awareness, Memory, Following commands, Attention, Orientation                 Orientation Level: Disoriented to, Place, Time,  Situation Current Attention Level: Focused Memory: Decreased recall of precautions, Decreased short-term memory Following Commands: Follows one step commands inconsistently, Follows one step commands with increased time Safety/Judgement: Decreased awareness of deficits Awareness: Intellectual Problem Solving: Slow processing, Decreased initiation, Difficulty sequencing, Requires verbal cues, Requires tactile cues General Comments: follows 50% of cues, extremely confused and seemingly hallucinating        Exercises      Shoulder Instructions       General Comments VSS on RA, RN states she pulled her foley and flexi    Pertinent Vitals/ Pain       Pain Assessment Pain Assessment: Faces Faces Pain Scale: Hurts a little bit Pain Location: generalized with movement Pain Descriptors / Indicators: Guarding Pain Intervention(s): Monitored during session, Limited activity within patient's tolerance  Home Living                                          Prior Functioning/Environment              Frequency  Min 2X/week        Progress Toward Goals  OT Goals(current goals can now be found in the care plan section)  Progress towards OT goals: Not progressing toward goals - comment  Acute Rehab OT Goals Patient Stated Goal: to pee OT Goal Formulation: With patient Time For Goal Achievement: 03/04/22 Potential to Achieve Goals: Good ADL Goals Pt Will Perform Upper Body Dressing: Independently;sitting Pt Will Perform Lower Body Dressing: Independently;sit to/from stand Pt Will Transfer to Toilet: Independently;ambulating Additional ADL Goal #1: Pt will independently complete IADL medication managment task Additional ADL Goal #2: Pt will tolerate at least 8 minutes of OOB standing functional activity  Plan Discharge plan remains appropriate    Co-evaluation                 AM-PAC OT "6 Clicks" Daily Activity     Outcome Measure   Help from  another person eating meals?: A Lot Help from another person taking care of personal grooming?: A Lot Help from another person toileting, which includes using toliet, bedpan, or urinal?: A Lot Help from another person bathing (including washing, rinsing, drying)?: A Lot Help from another person to put on and taking off regular upper body clothing?: A Lot Help from another person to put on and taking off regular lower body clothing?: A Lot 6 Click Score: 12    End of Session Equipment Utilized During Treatment: Rolling walker (2 wheels)  OT Visit Diagnosis: Unsteadiness on feet (R26.81);History of falling (Z91.81)   Activity Tolerance Patient tolerated treatment well;Patient limited by lethargy   Patient Left in bed;with call bell/phone within reach;with bed alarm set;with nursing/sitter in room   Nurse Communication Mobility status        Time: 4825-0037 OT Time Calculation (min): 22 min  Charges: OT General Charges $OT Visit: 1 Visit OT Treatments $Self Care/Home Management : 8-22 mins   Olan Kurek A Samayah Novinger 03/01/2022, 4:36 PM

## 2022-03-01 NOTE — Plan of Care (Signed)
  Problem: Education: Goal: Ability to describe self-care measures that may prevent or decrease complications (Diabetes Survival Skills Education) will improve Outcome: Progressing Goal: Individualized Educational Video(s) Outcome: Progressing   Problem: Coping: Goal: Ability to adjust to condition or change in health will improve Outcome: Progressing   Problem: Fluid Volume: Goal: Ability to maintain a balanced intake and output will improve Outcome: Progressing   Problem: Health Behavior/Discharge Planning: Goal: Ability to identify and utilize available resources and services will improve Outcome: Progressing Goal: Ability to manage health-related needs will improve Outcome: Progressing   Problem: Metabolic: Goal: Ability to maintain appropriate glucose levels will improve Outcome: Progressing   Problem: Nutritional: Goal: Maintenance of adequate nutrition will improve Outcome: Progressing Goal: Progress toward achieving an optimal weight will improve Outcome: Progressing   Problem: Skin Integrity: Goal: Risk for impaired skin integrity will decrease Outcome: Progressing   Problem: Tissue Perfusion: Goal: Adequacy of tissue perfusion will improve Outcome: Progressing   Problem: Safety: Goal: Non-violent Restraint(s) Outcome: Progressing   Problem: Education: Goal: Knowledge of General Education information will improve Description: Including pain rating scale, medication(s)/side effects and non-pharmacologic comfort measures Outcome: Progressing   Problem: Health Behavior/Discharge Planning: Goal: Ability to manage health-related needs will improve Outcome: Progressing   Problem: Clinical Measurements: Goal: Ability to maintain clinical measurements within normal limits will improve Outcome: Progressing Goal: Will remain free from infection Outcome: Progressing Goal: Diagnostic test results will improve Outcome: Progressing Goal: Respiratory complications  will improve Outcome: Progressing Goal: Cardiovascular complication will be avoided Outcome: Progressing   Problem: Activity: Goal: Risk for activity intolerance will decrease Outcome: Progressing   Problem: Nutrition: Goal: Adequate nutrition will be maintained Outcome: Progressing   Problem: Coping: Goal: Level of anxiety will decrease Outcome: Progressing   Problem: Elimination: Goal: Will not experience complications related to bowel motility Outcome: Progressing Goal: Will not experience complications related to urinary retention Outcome: Progressing   Problem: Pain Managment: Goal: General experience of comfort will improve Outcome: Progressing   Problem: Safety: Goal: Ability to remain free from injury will improve Outcome: Progressing   Problem: Skin Integrity: Goal: Risk for impaired skin integrity will decrease Outcome: Progressing   

## 2022-03-02 DIAGNOSIS — F10932 Alcohol use, unspecified with withdrawal with perceptual disturbance: Secondary | ICD-10-CM | POA: Diagnosis not present

## 2022-03-02 DIAGNOSIS — G9341 Metabolic encephalopathy: Secondary | ICD-10-CM | POA: Diagnosis not present

## 2022-03-02 DIAGNOSIS — N179 Acute kidney failure, unspecified: Secondary | ICD-10-CM | POA: Diagnosis not present

## 2022-03-02 DIAGNOSIS — F1010 Alcohol abuse, uncomplicated: Secondary | ICD-10-CM | POA: Diagnosis not present

## 2022-03-02 LAB — BASIC METABOLIC PANEL
Anion gap: 13 (ref 5–15)
BUN: 12 mg/dL (ref 8–23)
CO2: 25 mmol/L (ref 22–32)
Calcium: 9.3 mg/dL (ref 8.9–10.3)
Chloride: 104 mmol/L (ref 98–111)
Creatinine, Ser: 1.27 mg/dL — ABNORMAL HIGH (ref 0.44–1.00)
GFR, Estimated: 46 mL/min — ABNORMAL LOW (ref >60)
Glucose, Bld: 168 mg/dL — ABNORMAL HIGH (ref 70–99)
Potassium: 3.8 mmol/L (ref 3.5–5.1)
Sodium: 142 mmol/L (ref 135–145)

## 2022-03-02 LAB — CBC WITH DIFFERENTIAL/PLATELET
Abs Immature Granulocytes: 0.04 10*3/uL (ref 0.00–0.07)
Basophils Absolute: 0.1 10*3/uL (ref 0.0–0.1)
Basophils Relative: 1 %
Eosinophils Absolute: 0.2 10*3/uL (ref 0.0–0.5)
Eosinophils Relative: 3 %
HCT: 33.1 % — ABNORMAL LOW (ref 36.0–46.0)
Hemoglobin: 10.7 g/dL — ABNORMAL LOW (ref 12.0–15.0)
Immature Granulocytes: 1 %
Lymphocytes Relative: 24 %
Lymphs Abs: 1.7 10*3/uL (ref 0.7–4.0)
MCH: 31.2 pg (ref 26.0–34.0)
MCHC: 32.3 g/dL (ref 30.0–36.0)
MCV: 96.5 fL (ref 80.0–100.0)
Monocytes Absolute: 0.6 10*3/uL (ref 0.1–1.0)
Monocytes Relative: 9 %
Neutro Abs: 4.5 10*3/uL (ref 1.7–7.7)
Neutrophils Relative %: 62 %
Platelets: 377 10*3/uL (ref 150–400)
RBC: 3.43 MIL/uL — ABNORMAL LOW (ref 3.87–5.11)
RDW: 14.2 % (ref 11.5–15.5)
WBC: 7.1 10*3/uL (ref 4.0–10.5)
nRBC: 0 % (ref 0.0–0.2)

## 2022-03-02 LAB — GLUCOSE, CAPILLARY
Glucose-Capillary: 98 mg/dL (ref 70–99)
Glucose-Capillary: 134 mg/dL — ABNORMAL HIGH (ref 70–99)
Glucose-Capillary: 190 mg/dL — ABNORMAL HIGH (ref 70–99)
Glucose-Capillary: 144 mg/dL — ABNORMAL HIGH (ref 70–99)
Glucose-Capillary: 148 mg/dL — ABNORMAL HIGH (ref 70–99)
Glucose-Capillary: 149 mg/dL — ABNORMAL HIGH (ref 70–99)

## 2022-03-02 MED ORDER — CLONAZEPAM 0.5 MG PO TABS: 0.5000 mg | ORAL_TABLET | Freq: Two times a day (BID) | ORAL | Status: DC | PRN

## 2022-03-02 MED ORDER — CLONAZEPAM 0.25 MG PO TBDP
0.2500 mg | ORAL_TABLET | Freq: Two times a day (BID) | ORAL | Status: DC | PRN
Start: 2022-03-02 — End: 2022-03-05
  Administered 2022-03-02 – 2022-03-03 (×3): 0.25 mg via ORAL
  Filled 2022-03-02 (×3): qty 1

## 2022-03-02 MED ORDER — ASPIRIN 81 MG PO TBEC
81.0000 mg | DELAYED_RELEASE_TABLET | Freq: Every day | ORAL | Status: DC
  Administered 2022-03-02 – 2022-03-04 (×3): 81 mg via ORAL
  Filled 2022-03-02 (×3): qty 1

## 2022-03-02 NOTE — TOC Progression Note (Signed)
Transition of Care [facility]) - Progression Note    Patient Details  Name: [name] MRN: [id number] Date of Birth: [birth year]  Transition of Care [facility]) CM/SW [place], RN Phone Number:[phone]  03/02/2022, 12:40 PM  Clinical Narrative:    TOC continues to follow patient with Home health recommendations. Cm unable to discuss plan or choice with patient due to patient remains confused  and does not respond appropriately to questions.         Expected Discharge Plan and Services                                                 Social Determinants of Health (SDOH) Interventions    Readmission Risk Interventions     No data to display

## 2022-03-02 NOTE — Progress Notes (Signed)
Physical Therapy Treatment Patient Details Name: [name] MRN: [id number] DOB: October 09, 1950 Today's Date: 03/02/2022   History of Present Illness 70 y.o. female presents to [facility] hospital on 02/17/2022 s/p fall, striking head on dresser. Pt underwent laceration repair on 6/6. 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    PT Comments    The pt was ambulating in room with bed alarm going off upon arrival of PT, pt also incontinent of BM in standing, NT assisted the pt to the bathroom with minA. The pt was then able to complete multiple sit-stands and dynamic reaching from seated position with minG for safety but no overt LOB, and then completed short bout of ambulation in the room with minG-minA for balance. The session was limited by poor command following, safety awareness, and need for bladder scan, but the pt was able to demo good improvement in OOB mobility. She continues to need up to minA to manage any OOB mobility or transfers, would need constant assist and supervision at home currently which pt's spouse is unable to provide (per original PT eval, pt's spouse is dependent and unable to assist). Will continue to benefit from skilled PT acutely to progress OOB mobility, stability, and safety awareness to allow for safer and more independence with eventual return home.     Recommendations for follow up therapy are one component of a multi-disciplinary discharge planning process, led by the attending physician.  Recommendations may be updated based on patient status, additional functional criteria and insurance authorization.  Follow Up Recommendations  Home health PT (once pt is out of withdrawals)     Assistance Recommended at Discharge Frequent or constant Supervision/Assistance (given current cognitive deficits and confusion)  Patient can return home with the following Assistance with  cooking/housework;Assist for transportation;Help with stairs or ramp for entrance;A little help with walking and/or transfers;A little help with bathing/dressing/bathroom;Direct supervision/assist for medications management;Direct supervision/assist for financial management   Equipment Recommendations  Rolling walker (2 wheels)    Recommendations for Other Services       Precautions / Restrictions Precautions Precautions: Fall;Other (comment) Precaution Comments: CIWA - belt restraint Restrictions Weight Bearing Restrictions: No     Mobility  Bed Mobility Overal bed mobility: Needs Assistance Bed Mobility: Sit to Supine       Sit to supine: Min guard   General bed mobility comments: minG for safety, pt not following cues well but is able to get into bed and assist with repositioning.    Transfers Overall transfer level: Needs assistance Equipment used: 1 person hand held assist Transfers: Sit to/from Stand, Bed to chair/wheelchair/BSC Sit to Stand: Min assist   Step pivot transfers: Min assist       General transfer comment: minA to manage rising to standing, single UE support for balance. pt then able to take pivotal steps with minA    Ambulation/Gait Ambulation/Gait assistance: Min assist Gait Distance (Feet): 25 Feet Assistive device: 1 person hand held assist, None Gait Pattern/deviations: Decreased stride length, Shuffle Gait velocity: decreased Gait velocity interpretation: <1.31 ft/sec, indicative of household ambulator   General Gait Details: increased lateral movement and sway, no overt LOB. Needing minA-minG for safety      Balance Overall balance assessment: Needs assistance Sitting-balance support: Feet supported Sitting balance-Leahy Scale: Good Sitting balance - Comments: pt reaching and leaning for washing herself   Standing balance support: During functional activity, Single extremity supported, No upper extremity supported  Standing  balance-Leahy Scale: Poor                              Cognition Arousal/Alertness: Awake/alert Behavior During Therapy: Impulsive, Restless Overall Cognitive Status: Impaired/Different from baseline Area of Impairment: Problem solving, Safety/judgement, Awareness, Memory, Following commands, Attention, Orientation                 Orientation Level: Disoriented to, Place, Time, Situation (oriented to year and person) Current Attention Level: Focused Memory: Decreased recall of precautions, Decreased short-term memory Following Commands: Follows one step commands inconsistently, Follows one step commands with increased time Safety/Judgement: Decreased awareness of deficits, Decreased awareness of safety Awareness: Intellectual Problem Solving: Slow processing, Decreased initiation, Difficulty sequencing, Requires verbal cues, Requires tactile cues General Comments: pt needing cues to complete tasks safely, ambulating in room without supervision or assist, yelling about taking a shower.        Exercises      General Comments General comments (skin integrity, edema, etc.): VSS on RA, pt had BM on floor of room      Pertinent Vitals/Pain Pain Assessment Pain Assessment: No/denies pain Pain Intervention(s): Monitored during session     PT Goals (current goals can now be found in the care plan section) Acute Rehab PT Goals Patient Stated Goal: to take a shower PT Goal Formulation: With patient Time For Goal Achievement: 03/04/22 Potential to Achieve Goals: Good Progress towards PT goals: Progressing toward goals    Frequency    Min 3X/week      PT Plan Current plan remains appropriate       AM-PAC PT "6 Clicks" Mobility   Outcome Measure  Help needed turning from your back to your side while in a flat bed without using bedrails?: A Little Help needed moving from lying on your back to sitting on the side of a flat bed without using bedrails?: A  Little Help needed moving to and from a bed to a chair (including a wheelchair)?: A Lot Help needed standing up from a chair using your arms (e.g., wheelchair or bedside chair)?: A Little Help needed to walk in hospital room?: A Lot Help needed climbing 3-5 steps with a railing? : Total 6 Click Score: 14    End of Session Equipment Utilized During Treatment: Gait belt Activity Tolerance: Other (comment) (limited by confusion and need for bladder scan) Patient left: in bed;with call bell/phone within reach;with restraints reapplied;with bed alarm set;with family/visitor present Nurse Communication: Mobility status PT Visit Diagnosis: Unsteadiness on feet (R26.81);Other abnormalities of gait and mobility (R26.89);History of falling (Z91.81)     Time: 3790-2409 PT Time Calculation (min) (ACUTE ONLY): 19 min  Charges:  $Therapeutic Activity: 8-22 mins                     West Carbo, PT, DPT   Acute Rehabilitation Department   Sandra Cockayne 03/02/2022, 11:39 AM

## 2022-03-02 NOTE — Progress Notes (Signed)
Physical Therapy Treatment Patient Details Name: [name] MRN: [id number] DOB: Feb 07, 1951 Today's Date: 03/02/2022   History of Present Illness 71 y.o. female presents to [facility] hospital on 02/17/2022 s/p fall, striking head on dresser. Pt underwent laceration repair on 6/6. 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    PT Comments    The pt was seen for continued progression of mobility given marked improvement in mobility noted this morning in limited session. The pt was again attempting to get OOB on her own due to needing to use the bathroom, was able to follow simple cues to mobilize safely within the room with minG and no UE support. The pt was then able to progress to hallway ambulation with minA and no UE support, she staggers to L frequently and is unable to correct without cues. She also benefits from cues for wayfinding, and is limited by fatigue. Upon return to room, pt challenged by standing balance activities (RLE better than LLE) and repeated sit-stand transfers for LE strengthening. The pt will continue to benefit from skilled PT to progress functional strength and stability as well as safety awareness with mobility. Despite improvements in physical mobility, she remains limited in safety awareness, problem solving, and remains oriented to person and year only at time of this session.     Recommendations for follow up therapy are one component of a multi-disciplinary discharge planning process, led by the attending physician.  Recommendations may be updated based on patient status, additional functional criteria and insurance authorization.  Follow Up Recommendations  Home health PT (once pt is out of withdrawals)     Assistance Recommended at Discharge Frequent or constant Supervision/Assistance (given current cognitive deficits and confusion)  Patient can return home with the following  Assistance with cooking/housework;Assist for transportation;Help with stairs or ramp for entrance;A little help with walking and/or transfers;A little help with bathing/dressing/bathroom;Direct supervision/assist for medications management;Direct supervision/assist for financial management   Equipment Recommendations  Rolling walker (2 wheels)    Recommendations for Other Services       Precautions / Restrictions Precautions Precautions: Fall;Other (comment) Precaution Comments: CIWA - belt restraint Restrictions Weight Bearing Restrictions: No     Mobility  Bed Mobility Overal bed mobility: Needs Assistance Bed Mobility: Sit to Supine, Supine to Sit Rolling: Min guard     Sit to supine: Min guard   General bed mobility comments: minG for safety, pt not following cues well but is able to get into bed and assist with repositioning.    Transfers Overall transfer level: Needs assistance Equipment used: 1 person hand held assist, None Transfers: Sit to/from Stand Sit to Stand: Supervision, Min guard   Step pivot transfers: Min assist       General transfer comment: minG initially for safety, pt standing from toilet with supervision and UE support on rail    Ambulation/Gait Ambulation/Gait assistance: Min assist Gait Distance (Feet): 200 Feet Assistive device: 1 person hand held assist, None Gait Pattern/deviations: Decreased stride length, Shuffle, Staggering left, Drifts right/left Gait velocity: decreased Gait velocity interpretation: <1.31 ft/sec, indicative of household ambulator   General Gait Details: increased lateral movement and staggering to L. pt aware but not able to correct. x2 overt LOB needing miNA       Balance Overall balance assessment: Needs assistance Sitting-balance support: Feet supported Sitting balance-Leahy Scale: Good Sitting balance - Comments: pt reaching and leaning for washing herself   Standing balance  support: During functional  activity, Single extremity supported, No upper extremity supported Standing balance-Leahy Scale: Poor   Single Leg Stance - Right Leg: 1 Single Leg Stance - Left Leg: 0 Tandem Stance - Right Leg: 5 Tandem Stance - Left Leg: 2 [place] - Eyes Opened: 15                  Cognition Arousal/Alertness: Awake/alert Behavior During Therapy: Impulsive, Restless Overall Cognitive Status: Impaired/Different from baseline Area of Impairment: Problem solving, Safety/judgement, Awareness, Memory, Following commands, Attention, Orientation                 Orientation Level: Disoriented to, Place, Time, Situation (oriented to year and person) Current Attention Level: Focused Memory: Decreased recall of precautions, Decreased short-term memory Following Commands: Follows one step commands inconsistently, Follows one step commands with increased time Safety/Judgement: Decreased awareness of deficits, Decreased awareness of safety Awareness: Intellectual Problem Solving: Slow processing, Decreased initiation, Difficulty sequencing, Requires verbal cues, Requires tactile cues General Comments: pt needing cues to complete tasks safely, tearful and emotionally labile through session. states she is in [place] and drove herself there as the hospital in [place] has leeches.        Exercises Other Exercises Other Exercises: repeated sit-stand from EOB without use of UE. x10    General Comments General comments (skin integrity, edema, etc.): VSS on RA      Pertinent Vitals/Pain Pain Assessment Pain Assessment: No/denies pain Pain Intervention(s): Monitored during session, Repositioned     PT Goals (current goals can now be found in the care plan section) Acute Rehab PT Goals Patient Stated Goal: to take a shower PT Goal Formulation: With patient Time For Goal Achievement: 03/04/22 Potential to Achieve Goals: Good Progress towards PT goals: Progressing toward goals     Frequency    Min 3X/week      PT Plan Current plan remains appropriate       AM-PAC PT "6 Clicks" Mobility   Outcome Measure  Help needed turning from your back to your side while in a flat bed without using bedrails?: A Little Help needed moving from lying on your back to sitting on the side of a flat bed without using bedrails?: A Little Help needed moving to and from a bed to a chair (including a wheelchair)?: A Lot Help needed standing up from a chair using your arms (e.g., wheelchair or bedside chair)?: A Little Help needed to walk in hospital room?: A Lot Help needed climbing 3-5 steps with a railing? : Total 6 Click Score: 14    End of Session Equipment Utilized During Treatment: Gait belt Activity Tolerance: Patient tolerated treatment well Patient left: in bed;with call bell/phone within reach;with restraints reapplied;with bed alarm set Nurse Communication: Mobility status PT Visit Diagnosis: Unsteadiness on feet (R26.81);Other abnormalities of gait and mobility (R26.89);History of falling (Z91.81)     Time: 1610-9604 PT Time Calculation (min) (ACUTE ONLY): 30 min  Charges:  $Gait Training: 8-22 mins $Therapeutic Activity: 8-22 mins                     [place] Carbo, PT, DPT   Acute Rehabilitation Department   Sandra Cockayne 03/02/2022, 5:08 PM

## 2022-03-02 NOTE — Progress Notes (Signed)
[facility]                                                                               [name], is a 71 y.o. female, DOB - March 27, 1951, JJO:[id number] Admit date - 02/17/2022    Outpatient Primary MD for the patient is [place] Crane, Edwinna Areola, MD  LOS - 13  days    Brief summary   71 year old F with PMH of EtOH abuse, combined CHF s/p BIV PPM, CVA, DM-2, CKD-3B, neurogenic bladder with self-catheterization, chronic pain, GERD, anxiety, insomnia and depression presenting to [facility] after a fall at home the night of 6/5-6 hitting her head against a dresser while wearing her glasses.  Denies LOC.  Reportedly, "bled everywhere and cut the flap of skin off at home".  In ED, she was hypotensive to 74/44. Cr 5.6.  BUN 67.  Lactate 2.1.  Head CT negative for acute finding.  She was started on regular IV fluid and vasopressors and admitted to ICU for hypovolemic shock, AKI and acute blood loss anemia.  Patient had laceration repair by plastic surgery.  She came off vasopressors.  She is transferred to hospitalist service on 6/9.  She went into alcohol withdrawal with delirium.  PCCM was consulted and started on phenobarbital and Librium taper, Depakote and Ativan.  Patient completed phenobarbital and Librium tapers but remains confused, agitated requiring soft restraints. No wrist restraints overnight. She is alert , conversant but continues to have visual hallucinations.    Assessment & Plan    Assessment and Plan:   Alcohol withdrawal syndrome/acute toxic and metabolic encephalopathy with visual hallucinations. Patient was started on phenobarbital Librium taper, Depakote and Ativan by PCCM. Completed high-dose thiamine, phenobarbital and Librium taper and Depakote was discontinued on 16 June. Decreased home Zoloft at 100 mg daily.  Reorientation and delirium precautions. Patient is alert and oriented to person and place, but at the same time, she is referring to people sitting beside  her.    * Facial trauma due to fall at home In the setting of fall.  CT head without acute finding. -S/p complex laceration repair on 6/8 by plastic surgery. -Bactroban to cheek daily, Erythromycin oph oint to left eye daily, KY gel to lateral nose daily and outpatient follow-up in 10 days Outpatient follow up with plastic surgery in 10 days. Therapy evaluations to continue. Will request PT evaluation today.   Hypovolemic shock (HCC) Likely hypovolemic in the setting of dehydration, alcohol abuse and acute blood loss.  Resolved.  TTE with LVEF of 55 to 60%, G1 DD, normal RV/PASP and no WMA.  IV fluids to continue due to poor oral intake in the setting of encephalopathy and alcohol withdrawal.     AKI (acute kidney injury) ([place]) Patient was admitted with a creatinine of 5.5, patient's baseline creatinine around 1.3-1.5.  Creatinine improved with IV fluids currently at 1.2 today. Avoid nephrotoxins.  Continue to monitor.   Acute blood loss anemia superimposed on anemia of chronic disease Recent Labs    02/17/22 0756 02/17/22 0808 02/17/22 1524 02/18/22 0031 02/19/22 0355 02/20/22 1125  HGB 10.8* 11.2* 9.5* 8.9* 9.5* 9.0*  H&H stable after initial drop.  It seems she bled from the facial laceration before arrival.     Clostridioides difficile infection C. difficile toxin is negative but antigen and PCR positive.  Per RN, she is having constant liquid stool leakage.  No significant abdominal tenderness, fever or leukocytosis.  Completed 10 days of oral vancomycin.  Flexiseal discontinued.   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  No cardiopulmonary symptoms.  Appears euvolemic on exam.  On p.o. Lasix 40 mg daily at home. -Continue holding diuretics in the setting of AKI -Monitor intake and output, daily weight, renal functions and electrolytes - pt appears compensated, lasix and losartan on hold for recent AKI.    Uncontrolled IDDM-2 with hyperglycemia and CKD-3B A1c 7.3%. Recent Labs  Lab 02/19/22 1115 02/19/22 1558 02/19/22 2129 02/20/22 0851 02/20/22 1139  GLUCAP 172* 115* 162* 137* 131*  -Continue Semglee 10 units daily Continue with sensitive scale insulin No changes in meds.    Urinary tract infection Patient with neurogenic bladder and intermittent self-catheterization.  She is at risk for UTI.  UA with bacteria and pyuria.  Surprisingly urine culture is negative.  -Discontinue ceftriaxone given C. difficile.  She completed 4 days of IV Rocephin.  Lactic acidosis Resolved.  Rhabdomyolysis Resolved.  Hypokalemia/hypophosphatemia/hypomagnesemia Replace as appropriate  Thrombocytopenia ([place]) Likely due to alcohol abuse.  Improving.  Uncontrolled hypertension BP parameters are are optimal.  -Resume home Coreg  Chronic lower back pain Continue Tylenol and oxycodone as needed  Cerebral vascular accident ([place]) No focal neuro symptoms but limited exam due to mental status.   CT head without acute finding. Restarted home meds of crestor and aspirin.    Anxiety and depression Stable.      Estimated body mass index is 24.66 kg/m as calculated from the following:   Height as of this encounter: '5\' 1"'$  (1.549 m).   Weight as of this encounter: 59.2 kg.  Code Status: full code.  DVT Prophylaxis:  heparin injection 5,000 Units Start: 02/18/22 2200 Place TED hose Start: 02/17/22 1249   Level of Care: Level of care: Med-Surg Family Communication: none at bedside.   Disposition Plan:     Remains inpatient appropriate:  waiting for SNF.  Procedures:  None  Consultants:   PCCM signed off Plastic surgery signed off  Antimicrobials:   Anti-infectives (From admission, onward)    Start     Dose/Rate Route Frequency Ordered Stop   02/20/22 1415  vancomycin (VANCOCIN) capsule 125 mg        125 mg Oral 4 times daily 02/20/22 1321 03/02/22 1359   02/18/22 0600   ceFAZolin (ANCEF) IVPB 2g/100 mL premix  Status:  Discontinued        2 g 200 mL/hr over 30 Minutes Intravenous On call to O.R. 02/17/22 1835 02/17/22 1955   02/17/22 1830  cefTRIAXone (ROCEPHIN) 2 g in sodium chloride 0.9 % 100 mL IVPB  Status:  Discontinued        2 g 200 mL/hr over 30 Minutes Intravenous Every 24 hours 02/17/22 1828 02/20/22 1316   02/17/22 1708  ceFAZolin 1 g / gentamicin 80 mg in NS 500 mL surgical irrigation  Status:  Discontinued          As needed 02/17/22 1709 02/17/22 1824   02/17/22 0800  ceFAZolin (ANCEF) IVPB 2g/100 mL premix        2 g 200 mL/hr over 30 Minutes Intravenous  Once 02/17/22  9983 02/17/22 0851        Medications  Scheduled Meds:  buPROPion  300 mg Oral Daily   carvedilol  25 mg Oral BID WC   Chlorhexidine Gluconate Cloth  6 each Topical Daily   folic acid  1 mg Oral Daily   Gerhardt's butt cream   Topical BID   heparin  5,000 Units Subcutaneous Q8H   insulin aspart  0-5 Units Subcutaneous QHS   insulin aspart  0-9 Units Subcutaneous TID WC   multivitamin with minerals  1 tablet Oral Daily   pantoprazole  40 mg Oral Daily   rosuvastatin  20 mg Oral Daily   sertraline  100 mg Oral Daily   sodium bicarbonate  650 mg Oral TID   thiamine  100 mg Oral Daily   vancomycin  125 mg Oral QID   Continuous Infusions:  sodium chloride Stopped (02/17/22 1857)   PRN Meds:.acetaminophen **OR** acetaminophen, bisacodyl, clonazePAM, ondansetron **OR** ondansetron (ZOFRAN) IV, oxyCODONE    Subjective:   [name] was seen and examined today.  ALERT and answering questions. Continues to have some visual hallucinations.   Objective:   Vitals:   03/01/22 2326 03/02/22 0329 03/02/22 0500 03/02/22 0750  BP: 96/61 108/71  117/62  Pulse: 94   94  Resp: 20 20    Temp: 98.6 F (37 C) 98.5 F (36.9 C)  97.8 F (36.6 C)  TempSrc: Oral Axillary  Oral  SpO2: 100%   99%  Weight:   59.2 kg   Height:        Intake/Output Summary (Last 24  hours) at 03/02/2022 1108 Last data filed at 03/01/2022 1600 Gross per 24 hour  Intake 320 ml  Output 1200 ml  Net -880 ml    Filed Weights   02/28/22 0500 03/01/22 0500 03/02/22 0500  Weight: 59.2 kg 59.2 kg 59.2 kg     Exam General exam: Appears calm and comfortable  Respiratory system: Clear to auscultation. Respiratory effort normal. Cardiovascular system: S1 & S2 heard, RRR. No JVD, . No pedal edema. Gastrointestinal system: Abdomen is nondistended, soft and nontender. Normal bowel sounds heard. Central nervous system: Alert and oriented to person and place, able to move all extremities.  Extremities: Symmetric 5 x 5 power. Skin: No rashes, lesions or ulcers Psychiatry: visual hallucinations present.    Data Reviewed:  I have personally reviewed following labs and imaging studies   CBC Lab Results  Component Value Date   WBC 7.1 03/02/2022   RBC 3.43 (L) 03/02/2022   HGB 10.7 (L) 03/02/2022   HCT 33.1 (L) 03/02/2022   MCV 96.5 03/02/2022   MCH 31.2 03/02/2022   PLT 377 03/02/2022   MCHC 32.3 03/02/2022   RDW 14.2 03/02/2022   LYMPHSABS 1.7 03/02/2022   MONOABS 0.6 03/02/2022   EOSABS 0.2 03/02/2022   BASOSABS 0.1 38/25/0539     Last metabolic panel Lab Results  Component Value Date   NA 142 03/02/2022   K 3.8 03/02/2022   CL 104 03/02/2022   CO2 25 03/02/2022   BUN 12 03/02/2022   CREATININE 1.27 (H) 03/02/2022   GLUCOSE 168 (H) 03/02/2022   GFRNONAA 46 (L) 03/02/2022   GFRAA 46 (L) 03/14/2020   CALCIUM 9.3 03/02/2022   PHOS 3.2 02/27/2022   PROT 6.7 02/25/2022   ALBUMIN 3.0 (L) 02/27/2022   LABGLOB 2.5 09/21/2019   AGRATIO 1.8 09/21/2019   BILITOT 0.6 02/25/2022   ALKPHOS 104 02/25/2022   AST 32 02/25/2022  ALT 25 02/25/2022   ANIONGAP 13 03/02/2022    CBG (last 3)  Recent Labs    03/01/22 1705 03/01/22 2105 03/02/22 0749  GLUCAP 222* 134* 134*       Coagulation Profile: No results for input(s): "INR", "PROTIME" in the last 168  hours.   Radiology Studies: No results found.     Hosie Poisson M.D. [facility] 03/02/2022, 11:08 AM  Available via Epic secure chat 7am-7pm After 7 pm, please refer to night coverage provider listed on amion.

## 2022-03-02 NOTE — Progress Notes (Signed)
Patient unable to void. In& Out cath performed and a total of 650 Ml of urine drained.

## 2022-03-03 DIAGNOSIS — N179 Acute kidney failure, unspecified: Secondary | ICD-10-CM | POA: Diagnosis not present

## 2022-03-03 DIAGNOSIS — F1010 Alcohol abuse, uncomplicated: Secondary | ICD-10-CM | POA: Diagnosis not present

## 2022-03-03 DIAGNOSIS — F10932 Alcohol use, unspecified with withdrawal with perceptual disturbance: Secondary | ICD-10-CM | POA: Diagnosis not present

## 2022-03-03 DIAGNOSIS — G9341 Metabolic encephalopathy: Secondary | ICD-10-CM | POA: Diagnosis not present

## 2022-03-03 LAB — GLUCOSE, CAPILLARY
Glucose-Capillary: 128 mg/dL — ABNORMAL HIGH (ref 70–99)
Glucose-Capillary: 155 mg/dL — ABNORMAL HIGH (ref 70–99)
Glucose-Capillary: 146 mg/dL — ABNORMAL HIGH (ref 70–99)
Glucose-Capillary: 241 mg/dL — ABNORMAL HIGH (ref 70–99)

## 2022-03-03 NOTE — Progress Notes (Signed)
[facility]                                                                               [name], is a 71 y.o. female, DOB - 17-Sep-1950, [id number] Admit date - 02/17/2022    Outpatient Primary MD for the patient is [place] Crane, Edwinna Areola, MD  LOS - 14  days    Brief summary   71 year old F with PMH of EtOH abuse, combined CHF s/p BIV PPM, CVA, DM-2, CKD-3B, neurogenic bladder with self-catheterization, chronic pain, GERD, anxiety, insomnia and depression presenting to [facility] after a fall at home the night of 6/5-6 hitting her head against a dresser while wearing her glasses.  Denies LOC.  Reportedly, "bled everywhere and cut the flap of skin off at home".  In ED, she was hypotensive to 74/44. Cr 5.6.  BUN 67.  Lactate 2.1.  Head CT negative for acute finding.  She was started on regular IV fluid and vasopressors and admitted to ICU for hypovolemic shock, AKI and acute blood loss anemia.  Patient had laceration repair by plastic surgery.  She came off vasopressors.  She is transferred to hospitalist service on 6/9.  She went into alcohol withdrawal with delirium.  PCCM was consulted and started on phenobarbital and Librium taper, Depakote and Ativan.  Patient completed phenobarbital and Librium tapers. She is alert and oriented to person and place and wants to go home.    Assessment & Plan    Assessment and Plan:   Alcohol withdrawal syndrome/acute toxic and metabolic encephalopathy with visual hallucinations. Patient was started on phenobarbital Librium taper, Depakote and Ativan by PCCM. Completed high-dose thiamine, phenobarbital and Librium taper and Depakote was discontinued on 16 June. Decreased home Zoloft at 100 mg daily.  Reorientation and delirium precautions. Patient is alert and oriented to person and place, visual hallucinations are improving.    * Facial trauma due to fall at home In the setting of fall.  CT head without acute finding. -S/p complex  laceration repair on 6/8 by plastic surgery. -Bactroban to cheek daily, Erythromycin oph oint to left eye daily, KY gel to lateral nose daily and outpatient follow-up in 10 days Outpatient follow up with plastic surgery in 10 days. Requested plastic surgery to remove the stitches today.  Therapy evaluations to continue. Will request PT evaluation today.  Therapy eval recommending Home Health PT.   Hypovolemic shock (HCC) Likely hypovolemic in the setting of dehydration, alcohol abuse and acute blood loss.  Resolved.  TTE with LVEF of 55 to 60%, G1 DD, normal RV/PASP and no WMA.  IV fluids to continue due to poor oral intake in the setting of encephalopathy and alcohol withdrawal.     AKI (acute kidney injury) ([place]) Patient was admitted with a creatinine of 5.5, patient's baseline creatinine around 1.3-1.5.  Creatinine improved with IV fluids currently at 1.2 today. Avoid nephrotoxins.  Continue to monitor.   Acute blood loss anemia superimposed on anemia of chronic disease Recent Labs    02/17/22 0756 02/17/22 0808 02/17/22 1524 02/18/22 0031 02/19/22 0355 02/20/22 1125  HGB 10.8* 11.2* 9.5* 8.9* 9.5* 9.0*  H&H stable after initial drop.  It seems she bled from the facial laceration before arrival.     Clostridioides difficile infection C. difficile toxin is negative but antigen and PCR positive.  Per RN, she is having constant liquid stool leakage.  No significant abdominal tenderness, fever or leukocytosis.  Completed 10 days of oral vancomycin.  Flexiseal discontinued.   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  No cardiopulmonary symptoms.  Appears euvolemic on exam.  On p.o. Lasix 40 mg daily at home. -Continue holding diuretics in the setting of AKI -Monitor intake and output, daily weight, renal functions and electrolytes - pt appears compensated, lasix and losartan on hold for recent AKI.   Uncontrolled  IDDM-2 with hyperglycemia and CKD-3B A1c 7.3%. Recent Labs  Lab 02/19/22 1115 02/19/22 1558 02/19/22 2129 02/20/22 0851 02/20/22 1139  GLUCAP 172* 115* 162* 137* 131*  -Continue Semglee 10 units daily Continue with sensitive scale insulin No changes in meds.    Urinary tract infection Patient with neurogenic bladder and intermittent self-catheterization.  She is at risk for UTI.  UA with bacteria and pyuria.  Surprisingly urine culture is negative.  -Discontinue ceftriaxone given C. difficile.  She completed 4 days of IV Rocephin.  Lactic acidosis Resolved.  Rhabdomyolysis Resolved.  Hypokalemia/hypophosphatemia/hypomagnesemia Replace as appropriate  Thrombocytopenia ([place]) Likely due to alcohol abuse.  Improving.  Uncontrolled hypertension BP Parameters are optimal.  -Resume home Coreg  Chronic lower back pain Continue Tylenol and oxycodone as needed  Cerebral vascular accident ([place]) No focal neuro symptoms but limited exam due to mental status.   CT head without acute finding. Restarted home meds of crestor and aspirin.    Anxiety and depression Stable.      Estimated body mass index is 24.66 kg/m as calculated from the following:   Height as of this encounter: '5\' 1"'$  (1.549 m).   Weight as of this encounter: 59.2 kg.  Code Status: full code.  DVT Prophylaxis:  heparin injection 5,000 Units Start: 02/18/22 2200 Place TED hose Start: 02/17/22 1249   Level of Care: Level of care: Med-Surg Family Communication: none at bedside.   Disposition Plan:     Remains inpatient appropriate:  discharge home tomorrow after the removal of stitches. Procedures:  Laceration repair.   Consultants:   PCCM signed off Plastic surgery signed off  Antimicrobials:   Anti-infectives (From admission, onward)    Start     Dose/Rate Route Frequency Ordered Stop   02/20/22 1415  vancomycin (VANCOCIN) capsule 125 mg        125 mg Oral 4 times daily 02/20/22 1321  03/02/22 1359   02/18/22 0600  ceFAZolin (ANCEF) IVPB 2g/100 mL premix  Status:  Discontinued        2 g 200 mL/hr over 30 Minutes Intravenous On call to O.R. 02/17/22 1835 02/17/22 1955   02/17/22 1830  cefTRIAXone (ROCEPHIN) 2 g in sodium chloride 0.9 % 100 mL IVPB  Status:  Discontinued        2 g 200 mL/hr over 30 Minutes Intravenous Every 24 hours 02/17/22 1828 02/20/22 1316   02/17/22 1708  ceFAZolin 1 g / gentamicin 80 mg in NS 500 mL surgical irrigation  Status:  Discontinued          As needed 02/17/22 1709 02/17/22 1824   02/17/22 0800  ceFAZolin (ANCEF) IVPB 2g/100 mL premix        2 g 200 mL/hr over 30  Minutes Intravenous  Once 02/17/22 0757 02/17/22 0851        Medications  Scheduled Meds:  aspirin EC  81 mg Oral Daily   buPROPion  300 mg Oral Daily   carvedilol  25 mg Oral BID WC   Chlorhexidine Gluconate Cloth  6 each Topical Daily   folic acid  1 mg Oral Daily   Gerhardt's butt cream   Topical BID   heparin  5,000 Units Subcutaneous Q8H   insulin aspart  0-5 Units Subcutaneous QHS   insulin aspart  0-9 Units Subcutaneous TID WC   multivitamin with minerals  1 tablet Oral Daily   pantoprazole  40 mg Oral Daily   rosuvastatin  20 mg Oral Daily   sertraline  100 mg Oral Daily   sodium bicarbonate  650 mg Oral TID   thiamine  100 mg Oral Daily   Continuous Infusions:  sodium chloride Stopped (02/17/22 1857)   PRN Meds:.acetaminophen **OR** acetaminophen, bisacodyl, clonazePAM, ondansetron **OR** ondansetron (ZOFRAN) IV, oxyCODONE    Subjective:   [name] was seen and examined today.  Improving hallucinations.  No chest pain or sob. Worked with PT.   Objective:   Vitals:   03/02/22 2319 03/03/22 0322 03/03/22 0724 03/03/22 1142  BP: 129/60 130/72 118/75 (!) 158/98  Pulse: 98  89 (!) 105  Resp:  '19 16 15  '$ Temp: 98.2 F (36.8 C) 98.6 F (37 C) 97.8 F (36.6 C) 98 F (36.7 C)  TempSrc: Oral Oral Oral Oral  SpO2: 95%  99% 100%  Weight:       Height:        Intake/Output Summary (Last 24 hours) at 03/03/2022 1443 Last data filed at 03/03/2022 1313 Gross per 24 hour  Intake 236 ml  Output 2250 ml  Net -2014 ml    Filed Weights   02/28/22 0500 03/01/22 0500 03/02/22 0500  Weight: 59.2 kg 59.2 kg 59.2 kg     Exam General exam: Appears calm and comfortable S/P LACERATION repair and bandaged over the left eye.  Respiratory system: Clear to auscultation. Respiratory effort normal. Cardiovascular system: S1 & S2 heard, RRR. No JVD,  No pedal edema. Gastrointestinal system: Abdomen is nondistended, soft and nontender. Normal bowel sounds heard. Central nervous system: Alert and oriented. No focal neurological deficits. Extremities: Symmetric 5 x 5 power. Skin: No rashes, lesions or ulcers Psychiatry:  Mood & affect appropriate. No hallucinations today.    Data Reviewed:  I have personally reviewed following labs and imaging studies   CBC Lab Results  Component Value Date   WBC 7.1 03/02/2022   RBC 3.43 (L) 03/02/2022   HGB 10.7 (L) 03/02/2022   HCT 33.1 (L) 03/02/2022   MCV 96.5 03/02/2022   MCH 31.2 03/02/2022   PLT 377 03/02/2022   MCHC 32.3 03/02/2022   RDW 14.2 03/02/2022   LYMPHSABS 1.7 03/02/2022   MONOABS 0.6 03/02/2022   EOSABS 0.2 03/02/2022   BASOSABS 0.1 60/63/0160     Last metabolic panel Lab Results  Component Value Date   NA 142 03/02/2022   K 3.8 03/02/2022   CL 104 03/02/2022   CO2 25 03/02/2022   BUN 12 03/02/2022   CREATININE 1.27 (H) 03/02/2022   GLUCOSE 168 (H) 03/02/2022   GFRNONAA 46 (L) 03/02/2022   GFRAA 46 (L) 03/14/2020   CALCIUM 9.3 03/02/2022   PHOS 3.2 02/27/2022   PROT 6.7 02/25/2022   ALBUMIN 3.0 (L) 02/27/2022   LABGLOB 2.5 09/21/2019   AGRATIO  1.8 09/21/2019   BILITOT 0.6 02/25/2022   ALKPHOS 104 02/25/2022   AST 32 02/25/2022   ALT 25 02/25/2022   ANIONGAP 13 03/02/2022    CBG (last 3)  Recent Labs    03/02/22 2129 03/03/22 0723 03/03/22 1119   GLUCAP 148* 128* 155*       Coagulation Profile: No results for input(s): "INR", "PROTIME" in the last 168 hours.   Radiology Studies: No results found.     Hosie Poisson M.D. [facility] 03/03/2022, 2:43 PM  Available via Epic secure chat 7am-7pm After 7 pm, please refer to night coverage provider listed on amion.

## 2022-03-03 NOTE — Progress Notes (Signed)
Physical Therapy Treatment Patient Details Name: [name] MRN: [id number] DOB: 27-Feb-1951 Today's Date: 03/03/2022   History of Present Illness 71 y.o. female presents to [facility] hospital on 02/17/2022 s/p fall, striking head on dresser. Pt underwent laceration repair on 6/6. 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    PT Comments    Patient progressing well towards PT goals. Session focused on gait training, strengthening and functional mobility. Tolerated gait training with Min guard assist for safety but no overt LOB noted. Cognition seems to be improving with improved safety awareness. Worked on strengthening of BLEs and performing ADL task at sink. Reports some impaired vision (reports cataracts were removed recently and does not have glasses present). Pt emotionally labile throughout session discussing her recent life stressors and changes that have occurred in the last 2 years. "This was a wake up call." Pt's sister plans to come stay with her at d/c for support for a few weeks. Anticipate, pt's mobility and cognition will continue to improve with increased activity. Will follow.   Recommendations for follow up therapy are one component of a multi-disciplinary discharge planning process, led by the attending physician.  Recommendations may be updated based on patient status, additional functional criteria and insurance authorization.  Follow Up Recommendations  Home health PT     Assistance Recommended at Discharge Intermittent Supervision/Assistance  Patient can return home with the following Assistance with cooking/housework;Assist for transportation;Help with stairs or ramp for entrance;A little help with walking and/or transfers;A little help with bathing/dressing/bathroom;Direct supervision/assist for medications management;Direct supervision/assist for financial management   Equipment  Recommendations  None recommended by PT    Recommendations for Other Services       Precautions / Restrictions Precautions Precautions: Fall Precaution Comments: CIWA but better today Restrictions Weight Bearing Restrictions: No     Mobility  Bed Mobility Overal bed mobility: Needs Assistance Bed Mobility: Supine to Sit     Supine to sit: Supervision, HOB elevated     General bed mobility comments: No assist needed. Supervision for safety.    Transfers Overall transfer level: Needs assistance Equipment used: None Transfers: Sit to/from Stand Sit to Stand: Supervision           General transfer comment: Supervision for safety. Stood from [employer] without UE support.    Ambulation/Gait Ambulation/Gait assistance: Min guard Gait Distance (Feet): 250 Feet Assistive device: None Gait Pattern/deviations: Shuffle, Drifts right/left, Step-through pattern, Decreased step length - right, Decreased step length - left Gait velocity: decreased     General Gait Details: Slow, cautious and guarded gait with decreased arm swing, no overt LOB. Mild drifting noted in both directions. Able to pathfind to get back to room without cues.   Stairs             Wheelchair Mobility    Modified Rankin (Stroke Patients Only)       Balance Overall balance assessment: Needs assistance Sitting-balance support: Feet supported, No upper extremity supported Sitting balance-Leahy Scale: Good     Standing balance support: During functional activity Standing balance-Leahy Scale: Good Standing balance comment: Able to stand at sink and wash hands/brush teeth with supervision. Some difficulty with vision putting toothpaste on toothbrush                            Cognition Arousal/Alertness: Awake/alert Behavior During Therapy: WFL for tasks assessed/performed (emotionally labile)  Overall Cognitive Status: Impaired/Different from baseline Area of Impairment: Memory,  Awareness, Following commands, Safety/judgement, Problem solving                 Orientation Level: Disoriented to, Place   Memory: Decreased short-term memory Following Commands: Follows one step commands consistently Safety/Judgement: Decreased awareness of safety Awareness: Anticipatory Problem Solving: Slow processing General Comments: Pt is aware that she is at [facility] when she thought she was somewhere else yesterday however later in session on 2 accounts states, they didnt come cause I am not at [facility] but then able to correct self. Reports having lots of life changes and stressors the last 2 years rsulting in this to happen. "this was a wake up call." "I am not going to get up wtihout help." "Stay close to me, don't let me loose." Awareness seems to be improving. Emotionally labile during session. While therapist in room, pt on phone and told her friend that her therapist was in here yet continued to talk on phone.        Exercises Other Exercises Other Exercises: Sit to stand x15 from EOB without UE support for strengthening    General Comments General comments (skin integrity, edema, etc.): VSS on RA,      Pertinent Vitals/Pain Pain Assessment Pain Assessment: No/denies pain    Home Living                          Prior Function            PT Goals (current goals can now be found in the care plan section) Acute Rehab PT Goals Patient Stated Goal: to get better and go home PT Goal Formulation: With patient Time For Goal Achievement: 03/17/22 Potential to Achieve Goals: Good Progress towards PT goals: Progressing toward goals    Frequency    Min 3X/week      PT Plan Current plan remains appropriate    Co-evaluation              AM-PAC PT "6 Clicks" Mobility   Outcome Measure  Help needed turning from your back to your side while in a flat bed without using bedrails?: A Little Help needed moving from lying on your back to sitting  on the side of a flat bed without using bedrails?: A Little Help needed moving to and from a bed to a chair (including a wheelchair)?: A Little Help needed standing up from a chair using your arms (e.g., wheelchair or bedside chair)?: A Little Help needed to walk in hospital room?: A Little Help needed climbing 3-5 steps with a railing? : A Lot 6 Click Score: 17    End of Session Equipment Utilized During Treatment: Gait belt Activity Tolerance: Patient tolerated treatment well Patient left: in bed;with call bell/phone within reach;with bed alarm set (sitting EOB) Nurse Communication: Mobility status PT Visit Diagnosis: Unsteadiness on feet (R26.81);Other abnormalities of gait and mobility (R26.89);History of falling (Z91.81)     Time: 8889-1694 PT Time Calculation (min) (ACUTE ONLY): 31 min  Charges:  $Gait Training: 8-22 mins $Therapeutic Activity: 8-22 mins                     Marisa Severin, PT, DPT Acute Rehabilitation Services Secure chat preferred Office [place] 03/03/2022, 12:45 PM

## 2022-03-04 DIAGNOSIS — N179 Acute kidney failure, unspecified: Secondary | ICD-10-CM | POA: Diagnosis not present

## 2022-03-04 DIAGNOSIS — G9341 Metabolic encephalopathy: Secondary | ICD-10-CM | POA: Diagnosis not present

## 2022-03-04 DIAGNOSIS — D62 Acute posthemorrhagic anemia: Secondary | ICD-10-CM | POA: Diagnosis not present

## 2022-03-04 DIAGNOSIS — S0993XA Unspecified injury of face, initial encounter: Secondary | ICD-10-CM | POA: Diagnosis not present

## 2022-03-04 LAB — GLUCOSE, CAPILLARY
Glucose-Capillary: 117 mg/dL — ABNORMAL HIGH (ref 70–99)
Glucose-Capillary: 244 mg/dL — ABNORMAL HIGH (ref 70–99)

## 2022-03-04 MED ORDER — FOLIC ACID 1 MG PO TABS: 1.0000 mg | ORAL_TABLET | Freq: Every day | ORAL | Status: DC

## 2022-03-04 MED ORDER — THIAMINE HCL 100 MG PO TABS: 100.0000 mg | ORAL_TABLET | Freq: Every day | ORAL | Status: DC

## 2022-03-04 MED ORDER — CLONAZEPAM 0.25 MG PO TBDP: 0.2500 mg | ORAL_TABLET | Freq: Two times a day (BID) | ORAL | 0 refills | Status: DC | PRN

## 2022-03-04 MED ORDER — GERHARDT'S BUTT CREAM: 1.0000 | TOPICAL_CREAM | Freq: Two times a day (BID) | CUTANEOUS | Status: DC

## 2022-03-04 MED ORDER — SERTRALINE HCL 100 MG PO TABS: 100.0000 mg | ORAL_TABLET | Freq: Every day | ORAL | 1 refills | Status: DC

## 2022-03-04 MED ORDER — INSULIN ASPART 100 UNIT/ML IJ SOLN: INTRAMUSCULAR | 11 refills | Status: DC

## 2022-03-04 MED ORDER — ADULT MULTIVITAMIN W/MINERALS CH: 1.0000 | ORAL_TABLET | Freq: Every day | ORAL | Status: DC

## 2022-03-04 NOTE — TOC Progression Note (Signed)
Transition of Care [facility]) - Progression Note    Patient Details  Name: [name] MRN: [id number] Date of Birth: 1951-06-23  [facility] CM/SW [place], RN Phone Number:[phone]  03/04/2022, 12:16 PM  Clinical Narrative:    CM at bedside to offer patient choice for home health. Patient requested [facility]. Home health referral has been accepted with Tommi Rumps at [facility]. AVS updated. DME  rolling walker has been ordered per [employer] and will be delivered to the bedside. No other needs noted at this time.         Expected Discharge Plan and Services                                                 Social Determinants of Health (SDOH) Interventions    Readmission Risk Interventions     No data to display

## 2022-03-04 NOTE — Discharge Summary (Signed)
Physician Discharge Summary   Patient: [name] MRN: [id number] DOB: July 27, 1951  Admit date:     02/17/2022  Discharge date: 03/04/22  Discharge Physician: Hosie Poisson   PCP: Celene Squibb, MD   Recommendations at discharge:  Please follow up with PCP in one week.  Please follow up with Plastic Surgery in 2 weeks.   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   Clostridioides difficile infection   Acute blood loss anemia superimposed on anemia of chronic disease   Urinary tract infection   ANXIETY DEPRESSION   Cerebral vascular accident ([place])   Chronic lower back pain   Uncontrolled hypertension   Thrombocytopenia (HCC)   Hypokalemia   Rhabdomyolysis   Lactic acidosis   Hypophosphatemia   Hypomagnesemia   Metabolic acidosis   Visual hallucination   Delirium  Resolved Problems:   Hypotension   Chronic alcohol abuse   [facility] Course:  71 year old F with PMH of EtOH abuse, combined CHF s/p BIV PPM, CVA, DM-2, CKD-3B, neurogenic bladder with self-catheterization, chronic pain, GERD, anxiety, insomnia and depression presenting to [facility] ED after a fall at home the night of 6/5-6 hitting her head against a dresser while wearing her glasses.  Denies LOC.  Reportedly, "bled everywhere and cut the flap of skin off at home".  In ED, she was hypotensive to 74/44. Cr 5.6.  BUN 67.  Lactate 2.1.  Head CT negative for acute finding.  She was started on regular IV fluid and vasopressors and admitted to ICU for hypovolemic shock, AKI and acute blood loss anemia.   Patient had laceration repair by plastic surgery.  She came off vasopressors.  She is transferred to hospitalist service  on 6/9.  She went into alcohol withdrawal with delirium.  PCCM was consulted and started on phenobarbital and Librium taper, Depakote and Ativan.  Patient completed phenobarbital and Librium tapers. She is alert and oriented to person and place and wants to go home.   Assessment and Plan: Alcohol withdrawal syndrome/acute toxic and metabolic encephalopathy with visual hallucinations. Patient was started on phenobarbital Librium taper, Depakote and Ativan by PCCM. Completed high-dose thiamine, phenobarbital and Librium taper and Depakote was discontinued on 16 June. Decreased home Zoloft at 100 mg daily.  Reorientation and delirium precautions. Patient is alert and oriented to person and place, visual hallucinations have resolved.    * Facial trauma due to fall at home In the setting of fall.  CT head without acute finding. -S/p complex laceration repair on 6/8 by plastic surgery. -Bactroban to cheek daily, Erythromycin oph oint to left eye daily, KY gel to lateral nose daily and outpatient follow-up in 10 days Outpatient follow up with plastic surgery in 10 days. Requested plastic surgery to remove the stitches today.  Therapy evaluations to continue.  Therapy eval recommending Home Health PT.    Hypovolemic shock (HCC) Likely hypovolemic in the setting of dehydration, alcohol abuse and acute blood loss.  Resolved.  TTE with LVEF of 55 to 60%, G1 DD, normal RV/PASP and no WMA.  IV fluids to continue due to poor oral intake in the setting of encephalopathy and alcohol withdrawal.  AKI (acute kidney injury) ([place]) Patient was admitted with a creatinine of 5.5, patient's baseline creatinine around 1.3-1.5.  Creatinine improved with IV fluids currently at 1.2 today. Avoid nephrotoxins.  Continue to monitor.     Acute blood loss anemia superimposed on anemia of chronic disease         Recent Labs    02/17/22 0756 02/17/22 0808 02/17/22 1524 02/18/22 0031 02/19/22 0355  02/20/22 1125  HGB 10.8* 11.2* 9.5* 8.9* 9.5* 9.0*  H&H stable after initial drop.  It seems she bled from the facial laceration before arrival.        Clostridioides difficile infection C. difficile toxin is negative but antigen and PCR positive.  Per RN, she is having constant liquid stool leakage.  No significant abdominal tenderness, fever or leukocytosis.  Completed 10 days of oral vancomycin.  Flexiseal discontinued.    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  No cardiopulmonary symptoms.  Appears euvolemic on exam.  On p.o. Lasix 40 mg daily at home. -Continue holding diuretics in the setting of AKI -Monitor intake and output, daily weight, renal functions and electrolytes - pt appears compensated, .   Uncontrolled IDDM-2 with hyperglycemia and CKD-3B A1c 7.3%.        Recent Labs  Lab 02/19/22 1115 02/19/22 1558 02/19/22 2129 02/20/22 0851 02/20/22 1139  GLUCAP 172* 115* 162* 137* 131*  -Continue Semglee 10 units daily Continue with sensitive scale insulin No changes in meds.      Urinary tract infection Patient with neurogenic bladder and intermittent self-catheterization.  She is at risk for UTI.  UA with bacteria and pyuria.  Surprisingly urine culture is negative.  -Discontinue ceftriaxone given C. difficile.  She completed 4 days of IV Rocephin.   Lactic acidosis Resolved.   Rhabdomyolysis Resolved.   Hypokalemia/hypophosphatemia/hypomagnesemia Replace as appropriate   Thrombocytopenia ([place]) Likely due to alcohol abuse.  Improving.   Uncontrolled hypertension BP Parameters are optimal.  -Resume home Coreg   Chronic lower back pain Continue Tylenol and oxycodone as needed   Cerebral vascular accident ([place]) No focal neuro symptoms but limited exam due to mental status.   CT head without acute finding. Restarted home meds of crestor and aspirin.      Anxiety and depression Stable.           Estimated body mass index is 24.66 kg/m as calculated from the following:   Height as of this encounter: '5\' 1"'$  (1.549 m).   Weight as of this encounter: 59.2 kg.  Consultants: PLASTIC surgery.  PCCM  Procedures performed: laceration repair.   Disposition: Home Diet recommendation:  Discharge Diet Orders (From admission, onward)     Start     Ordered   03/04/22 0000  Diet - low sodium heart healthy        03/04/22 1551           Regular diet DISCHARGE MEDICATION: Allergies as of 03/04/2022   No Known Allergies      Medication List     STOP taking these medications    benzonatate 200 MG capsule Commonly known as: TESSALON   clonazePAM 1 MG tablet Commonly known as: KLONOPIN Replaced by: clonazePAM 0.25 MG disintegrating tablet   gabapentin 600 MG tablet Commonly known as: NEURONTIN   HYDROcodone-acetaminophen 10-325 MG tablet Commonly known as: NORCO   insulin lispro 100 UNIT/ML KwikPen Commonly known as: HumaLOG KwikPen   Lantus  SoloStar 100 UNIT/ML Solostar Pen Generic drug: insulin glargine   ondansetron 4 MG disintegrating tablet Commonly known as: Zofran ODT   zolpidem 10 MG tablet Commonly known as: AMBIEN       TAKE these medications    aspirin EC 81 MG tablet Take 81 mg by mouth daily.   buPROPion 300 MG 24 hr tablet Commonly known as: WELLBUTRIN XL Take 300 mg by mouth daily.   Bydureon BCise 2 MG/0.85ML Auij Generic drug: Exenatide ER Inject 2 mg into the skin once a week.   carvedilol 12.5 MG tablet Commonly known as: COREG Take 12.5 mg by mouth 2 (two) times daily.   clonazePAM 0.25 MG disintegrating tablet Commonly known as: KLONOPIN Take 1 tablet (0.25 mg total) by mouth 2 (two) times daily as needed for up to 3 days (anxiety). Replaces: clonazePAM 1 MG tablet   fluticasone 50 MCG/ACT nasal spray Commonly known as: FLONASE Place 2 sprays into both nostrils daily.   folic acid 1 MG tablet Commonly known as:  FOLVITE Take 1 tablet (1 mg total) by mouth daily. Start taking on: March 05, 2022   furosemide 40 MG tablet Commonly known as: LASIX Take 1 tablet (40 mg total) by mouth daily.   Gerhardt's butt cream Crea Apply 1 Application topically 2 (two) times daily.   insulin aspart 100 UNIT/ML injection Commonly known as: novoLOG CBG 70 - 120: 0 units  CBG 121 - 150: 1 unit  CBG 151 - 200: 2 units  CBG 201 - 250: 3 units  CBG 251 - 300: 5 units  CBG 301 - 350: 7 units  CBG 351 - 400: 9 units   losartan 25 MG tablet Commonly known as: COZAAR Take 12.5 mg by mouth daily.   multivitamin with minerals Tabs tablet Take 1 tablet by mouth daily. Start taking on: March 05, 2022   omeprazole 20 MG capsule Commonly known as: PRILOSEC Take 20 mg by mouth daily.   potassium chloride SA 20 MEQ tablet Commonly known as: KLOR-CON M Take 20 mEq by mouth 2 (two) times daily.   rosuvastatin 40 MG tablet Commonly known as: CRESTOR TAKE ONE (1) TABLET BY MOUTH EVERY DAY (STOP ATORVASTATIN)   sertraline 100 MG tablet Commonly known as: ZOLOFT Take 1 tablet (100 mg total) by mouth daily. Start taking on: March 05, 2022 What changed: how much to take   SYSTANE CONTACTS OP Place 1 drop into both eyes 2 (two) times daily.   thiamine 100 MG tablet Take 1 tablet (100 mg total) by mouth daily. Start taking on: March 05, 2022               Durable Medical Equipment  (From admission, onward)           Start     Ordered   03/04/22 1021  For home use only DME Walker rolling  Once       Question Answer Comment  Walker: With Kenvir Wheels   Patient needs a walker to treat with the following condition Weakness      03/04/22 1021              Discharge Care Instructions  (From admission, onward)           Start     Ordered   03/04/22 0000  Discharge wound care:       Comments: KY gel to left nose daily  Please apply to green adaptic Cover with gauze Eye sutured shut  laterally Bactroban to left cheek incision daily       03/04/22 1551            Follow-up Information     [facility], [facility] Follow up.   Specialty: Home Health Services Why: Your home health has been set up with [facility]. The office will call you with start of service information. If you have any qiestions please call the number listed above Contact information: [address]  [place] [address] [phone]         Celene Squibb, MD. Schedule an appointment as soon as possible for a visit in 1 week(s).   Specialty: Internal Medicine Contact information: [place] [facility] [address] [phone]         Evans Lance, MD .   Specialty: Cardiology Contact information: [phone] [address] [place] [address] [phone]         Evans Lance, MD .   Specialty: Cardiology Contact information: [phone] [address] [place] [address] [phone]         Wallace Going, DO. Schedule an appointment as soon as possible for a visit in 1 day(s).   Specialty: Plastic Surgery Contact information: [address]  [place] [address] [phone]                Discharge Exam: Danley Danker Weights   03/01/22 0500 03/02/22 0500 03/04/22 0500  Weight: 59.2 kg 59.2 kg 59.2 kg   General exam: Appears calm and comfortable  Respiratory system: Clear to auscultation. Respiratory effort normal. Cardiovascular system: S1 & S2 heard, RRR. No JVD,. No pedal edema. Gastrointestinal system: Abdomen is nondistended, soft and nontender.. Normal bowel sounds heard. Central nervous system: Alert and oriented. No focal neurological deficits. Extremities: Symmetric 5 x 5 power. Skin: No rashes, lesions or ulcers Psychiatry: Judgement and insight appear normal. Mood & affect appropriate.    Condition at discharge: fair  The results of significant diagnostics from this hospitalization  (including imaging, microbiology, ancillary and laboratory) are listed below for reference.   Imaging Studies: CUP PACEART REMOTE DEVICE CHECK  Result Date: 02/20/2022 Scheduled remote reviewed. Normal device function.  1 NSVT 13 beats 16 fast AV, longest SVT 27mn 22sec, HR's 150's Next remote 91 days. LA  ECHOCARDIOGRAM COMPLETE  Result Date: 02/18/2022    ECHOCARDIOGRAM REPORT   Patient Name:   JPAULEEN GOLEMANDate of Exam: 02/18/2022 Medical Rec #:  [id number]       Height:       61.0 in Accession #:    [id number]      Weight:       147.9 lb Date of Birth:  1Sep 10, 1952       BSA:          1.662 m Patient Age:    89years         BP:           112/65 mmHg Patient Gender: F                HR:           91 bpm. Exam Location:  Inpatient Procedure: 2D Echo, Cardiac Doppler and Color Doppler Indications:    Shock  History:        Patient has prior history of Echocardiogram examinations, most  recent 01/18/2020. Cardiomyopathy, Pacemaker, Defibrillator and                 Abnormal ECG, Stroke, Arrythmias:Tachycardia,                 Signs/Symptoms:Hypotension and Altered Mental Status; Risk                 Factors:Hypertension and Dyslipidemia. ETOH.  Sonographer:    Roseanna Rainbow RDCS Referring Phys: [id number] [facility]  Sonographer Comments: Technically difficult study due to poor echo windows. IMPRESSIONS  1. Left ventricular ejection fraction, by estimation, is 55 to 60%. The left ventricle has normal function. The left ventricle has no regional wall motion abnormalities. There is mild concentric left ventricular hypertrophy. Left ventricular diastolic parameters are consistent with Grade I diastolic dysfunction (impaired relaxation).  2. Right ventricular systolic function is normal. The right ventricular size is normal. There is normal pulmonary artery systolic pressure. The estimated right ventricular systolic pressure is 54.0 mmHg.  3. The mitral valve is normal in structure. Trivial  mitral valve regurgitation.  4. Tricuspid valve regurgitation is mild to moderate.  5. The aortic valve is tricuspid. Aortic valve regurgitation is not visualized. Aortic valve sclerosis is present, with no evidence of aortic valve stenosis.  6. The inferior vena cava is normal in size with <50% respiratory variability, suggesting right atrial pressure of 8 mmHg. Comparison(s): Compared to prior TTE in 01/2020, there is no significant change. FINDINGS  Left Ventricle: Left ventricular ejection fraction, by estimation, is 55 to 60%. The left ventricle has normal function. The left ventricle has no regional wall motion abnormalities. The left ventricular internal cavity size was normal in size. There is  mild concentric left ventricular hypertrophy. Left ventricular diastolic parameters are consistent with Grade I diastolic dysfunction (impaired relaxation). Right Ventricle: The right ventricular size is normal. No increase in right ventricular wall thickness. Right ventricular systolic function is normal. There is normal pulmonary artery systolic pressure. The tricuspid regurgitant velocity is 2.56 m/s, and  with an assumed right atrial pressure of 8 mmHg, the estimated right ventricular systolic pressure is 08.6 mmHg. Left Atrium: Left atrial size was normal in size. Right Atrium: Right atrial size was normal in size. Pericardium: There is no evidence of pericardial effusion. Mitral Valve: The mitral valve is normal in structure. There is mild thickening of the mitral valve leaflet(s). There is mild calcification of the mitral valve leaflet(s). Mild mitral annular calcification. Trivial mitral valve regurgitation. MV peak gradient, 9.0 mmHg. The mean mitral valve gradient is 4.0 mmHg. Tricuspid Valve: The tricuspid valve is normal in structure. Tricuspid valve regurgitation is mild to moderate. Aortic Valve: The aortic valve is tricuspid. Aortic valve regurgitation is not visualized. Aortic valve sclerosis is  present, with no evidence of aortic valve stenosis. Pulmonic Valve: The pulmonic valve was normal in structure. Pulmonic valve regurgitation is not visualized. Aorta: The aortic root and ascending aorta are structurally normal, with no evidence of dilitation. Venous: The inferior vena cava is normal in size with less than 50% respiratory variability, suggesting right atrial pressure of 8 mmHg. IAS/Shunts: The atrial septum is grossly normal. Additional Comments: A device lead is visualized.  LEFT VENTRICLE PLAX 2D LVIDd:         4.40 cm     Diastology LVIDs:         3.10 cm     LV e' medial:    5.28 cm/s LV PW:  0.90 cm     LV E/e' medial:  17.6 LV IVS:        1.10 cm     LV e' lateral:   6.49 cm/s LVOT diam:     1.70 cm     LV E/e' lateral: 14.3 LV SV:         62 LV SV Index:   37 LVOT Area:     2.27 cm  LV Volumes (MOD) LV vol d, MOD A2C: 72.6 ml LV vol d, MOD A4C: 55.4 ml LV vol s, MOD A2C: 23.1 ml LV vol s, MOD A4C: 23.1 ml LV SV MOD A2C:     49.5 ml LV SV MOD A4C:     55.4 ml LV SV MOD BP:      40.6 ml RIGHT VENTRICLE            IVC RV S prime:     9.73 cm/s  IVC diam: 1.90 cm TAPSE (M-mode): 1.5 cm LEFT ATRIUM             Index        RIGHT ATRIUM           Index LA diam:        3.00 cm 1.81 cm/m   RA Area:     13.30 cm LA Vol (A2C):   32.6 ml 19.62 ml/m  RA Volume:   31.60 ml  19.02 ml/m LA Vol (A4C):   39.0 ml 23.47 ml/m LA Biplane Vol: 37.3 ml 22.45 ml/m  AORTIC VALVE LVOT Vmax:   149.00 cm/s LVOT Vmean:  99.800 cm/s LVOT VTI:    0.273 m  AORTA Ao Root diam: 2.90 cm Ao Asc diam:  3.00 cm MITRAL VALVE                TRICUSPID VALVE MV Area (PHT): 5.75 cm     TR Peak grad:   26.2 mmHg MV Area VTI:   2.42 cm     TR Vmax:        256.00 cm/s MV Peak grad:  9.0 mmHg MV Mean grad:  4.0 mmHg     SHUNTS MV Vmax:       1.50 m/s     Systemic VTI:  0.27 m MV Vmean:      92.2 cm/s    Systemic Diam: 1.70 cm MV Decel Time: 132 msec MV E velocity: 93.00 cm/s MV A velocity: 129.50 cm/s MV E/A ratio:  0.72  Gwyndolyn Kaufman MD Electronically signed by Gwyndolyn Kaufman MD Signature Date/Time: 02/18/2022/11:42:48 AM    Final    DG CHEST PORT 1 VIEW  Result Date: 02/17/2022 CLINICAL DATA:  Fall EXAM: PORTABLE CHEST 1 VIEW COMPARISON:  11/11/2021 FINDINGS: Mild atelectasis at the left lung base. No pleural effusion. No pneumothorax. Cardiomediastinal contours are within normal limits for technique. Left chest wall ICD. IMPRESSION: Mild atelectasis at the left lung base. Electronically Signed   By: Macy Mis M.D.   On: 02/17/2022 16:00   DG Pelvis Portable  Result Date: 02/17/2022 CLINICAL DATA:  Pt had a fall last night suffering a facial trauma - complaining of left hip pain after - no chest complaints (hx of diabetes, htn, JSEG)[id number] EXAM: PORTABLE PELVIS 1-2 VIEWS COMPARISON:  None Available. FINDINGS: Hips are located. Sclerosis of the LEFT hip joint. No femoral neck fracture. No sacral fracture or pelvic fracture identified. Posterior lumbar fusion. IMPRESSION: 1. No acute findings the pelvis. 2. Arthropathy hip joints, LEFT greater than RIGHT.  Electronically Signed   By: Suzy Bouchard M.D.   On: 02/17/2022 15:49   CT MAXILLOFACIAL WO CONTRAST  Result Date: 02/17/2022 CLINICAL DATA:  [place] this morning striking LEFT side of face/head on dresser, LEFT facial pain, neck pain, blunt facial trauma, denies loss of consciousness. History of cardiomyopathy, hypertension, type II diabetes mellitus EXAM: CT HEAD WITHOUT CONTRAST CT MAXILLOFACIAL WITHOUT CONTRAST CT CERVICAL SPINE WITHOUT CONTRAST TECHNIQUE: Multidetector CT imaging of the head, cervical spine, and maxillofacial structures were performed using the standard protocol without intravenous contrast. Multiplanar CT image reconstructions of the cervical spine and maxillofacial structures were also generated. RADIATION DOSE REDUCTION: This exam was performed according to the departmental dose-optimization program which includes automated exposure  control, adjustment of the mA and/or kV according to patient size and/or use of iterative reconstruction technique. COMPARISON:  CT head 03/11/2020 FINDINGS: CT HEAD FINDINGS Brain: Normal ventricular morphology. No midline shift or mass effect. Normal appearance of brain parenchyma. No intracranial hemorrhage, mass lesion, evidence of acute infarction, or extra-axial fluid collection. Vascular: No hyperdense vessels. Atherosclerotic calcifications identified at internal carotid and vertebral arteries at skull base. Skull: Calvaria intact Other: N/A CT MAXILLOFACIAL FINDINGS Osseous: Osseous mineralization normal. Thin calcific density identified anterior to the LEFT inferior orbital rim, question radiopaque foreign body versus tiny fracture fragment; no definite donor site seen. Remaining facial bones intact. TMJ alignment normal. Orbits: Bony orbits intact.  No intraorbital gas or fluid Sinuses: Paranasal sinuses, visualized mastoid air cells, and middle ear cavities clear bilaterally Soft tissues: Soft tissue gas LEFT periorbital extending into pre maxillary region consistent with large soft tissue laceration. Associated soft tissue swelling and small LEFT frontal scalp hematoma. Remaining soft tissues unremarkable. CT CERVICAL SPINE FINDINGS Alignment: Normal Skull base and vertebrae: Osseous mineralization low normal. Skull base intact. Vertebral body heights maintained. Disc space narrowing C5-C6 with small endplate spurs. No fracture, subluxation or bone destruction. Scattered multilevel facet degenerative changes bilaterally. Soft tissues and spinal canal: Prevertebral soft tissues normal thickness. Atherosclerotic calcification of vertebral arteries at skull base, carotid bifurcations, and proximal RIGHT subclavian artery. Disc levels:  No specific abnormalities Upper chest: Lung apices clear Other: N/A IMPRESSION: No acute intracranial abnormalities. LEFT facial laceration with soft tissue gas LEFT  periorbital extending into pre maxillary region. Thin calcific density anterior to the LEFT inferior orbital rim, question radiopaque foreign body versus tiny fracture fragment from LEFT inferior orbital rim of; no definite donor site seen. Scattered multilevel degenerative facet degenerative changes of the cervical spine. Degenerative disc disease changes C5-C6. No acute cervical spine abnormalities. Electronically Signed   By: Lavonia Dana M.D.   On: 02/17/2022 09:33   CT HEAD WO CONTRAST  Result Date: 02/17/2022 CLINICAL DATA:  [place] this morning striking LEFT side of face/head on dresser, LEFT facial pain, neck pain, blunt facial trauma, denies loss of consciousness. History of cardiomyopathy, hypertension, type II diabetes mellitus EXAM: CT HEAD WITHOUT CONTRAST CT MAXILLOFACIAL WITHOUT CONTRAST CT CERVICAL SPINE WITHOUT CONTRAST TECHNIQUE: Multidetector CT imaging of the head, cervical spine, and maxillofacial structures were performed using the standard protocol without intravenous contrast. Multiplanar CT image reconstructions of the cervical spine and maxillofacial structures were also generated. RADIATION DOSE REDUCTION: This exam was performed according to the departmental dose-optimization program which includes automated exposure control, adjustment of the mA and/or kV according to patient size and/or use of iterative reconstruction technique. COMPARISON:  CT head 03/11/2020 FINDINGS: CT HEAD FINDINGS Brain: Normal ventricular morphology. No midline shift or mass effect. Normal  appearance of brain parenchyma. No intracranial hemorrhage, mass lesion, evidence of acute infarction, or extra-axial fluid collection. Vascular: No hyperdense vessels. Atherosclerotic calcifications identified at internal carotid and vertebral arteries at skull base. Skull: Calvaria intact Other: N/A CT MAXILLOFACIAL FINDINGS Osseous: Osseous mineralization normal. Thin calcific density identified anterior to the LEFT inferior  orbital rim, question radiopaque foreign body versus tiny fracture fragment; no definite donor site seen. Remaining facial bones intact. TMJ alignment normal. Orbits: Bony orbits intact.  No intraorbital gas or fluid Sinuses: Paranasal sinuses, visualized mastoid air cells, and middle ear cavities clear bilaterally Soft tissues: Soft tissue gas LEFT periorbital extending into pre maxillary region consistent with large soft tissue laceration. Associated soft tissue swelling and small LEFT frontal scalp hematoma. Remaining soft tissues unremarkable. CT CERVICAL SPINE FINDINGS Alignment: Normal Skull base and vertebrae: Osseous mineralization low normal. Skull base intact. Vertebral body heights maintained. Disc space narrowing C5-C6 with small endplate spurs. No fracture, subluxation or bone destruction. Scattered multilevel facet degenerative changes bilaterally. Soft tissues and spinal canal: Prevertebral soft tissues normal thickness. Atherosclerotic calcification of vertebral arteries at skull base, carotid bifurcations, and proximal RIGHT subclavian artery. Disc levels:  No specific abnormalities Upper chest: Lung apices clear Other: N/A IMPRESSION: No acute intracranial abnormalities. LEFT facial laceration with soft tissue gas LEFT periorbital extending into pre maxillary region. Thin calcific density anterior to the LEFT inferior orbital rim, question radiopaque foreign body versus tiny fracture fragment from LEFT inferior orbital rim of; no definite donor site seen. Scattered multilevel degenerative facet degenerative changes of the cervical spine. Degenerative disc disease changes C5-C6. No acute cervical spine abnormalities. Electronically Signed   By: Lavonia Dana M.D.   On: 02/17/2022 09:33   CT CERVICAL SPINE WO CONTRAST  Result Date: 02/17/2022 CLINICAL DATA:  [place] this morning striking LEFT side of face/head on dresser, LEFT facial pain, neck pain, blunt facial trauma, denies loss of consciousness.  History of cardiomyopathy, hypertension, type II diabetes mellitus EXAM: CT HEAD WITHOUT CONTRAST CT MAXILLOFACIAL WITHOUT CONTRAST CT CERVICAL SPINE WITHOUT CONTRAST TECHNIQUE: Multidetector CT imaging of the head, cervical spine, and maxillofacial structures were performed using the standard protocol without intravenous contrast. Multiplanar CT image reconstructions of the cervical spine and maxillofacial structures were also generated. RADIATION DOSE REDUCTION: This exam was performed according to the departmental dose-optimization program which includes automated exposure control, adjustment of the mA and/or kV according to patient size and/or use of iterative reconstruction technique. COMPARISON:  CT head 03/11/2020 FINDINGS: CT HEAD FINDINGS Brain: Normal ventricular morphology. No midline shift or mass effect. Normal appearance of brain parenchyma. No intracranial hemorrhage, mass lesion, evidence of acute infarction, or extra-axial fluid collection. Vascular: No hyperdense vessels. Atherosclerotic calcifications identified at internal carotid and vertebral arteries at skull base. Skull: Calvaria intact Other: N/A CT MAXILLOFACIAL FINDINGS Osseous: Osseous mineralization normal. Thin calcific density identified anterior to the LEFT inferior orbital rim, question radiopaque foreign body versus tiny fracture fragment; no definite donor site seen. Remaining facial bones intact. TMJ alignment normal. Orbits: Bony orbits intact.  No intraorbital gas or fluid Sinuses: Paranasal sinuses, visualized mastoid air cells, and middle ear cavities clear bilaterally Soft tissues: Soft tissue gas LEFT periorbital extending into pre maxillary region consistent with large soft tissue laceration. Associated soft tissue swelling and small LEFT frontal scalp hematoma. Remaining soft tissues unremarkable. CT CERVICAL SPINE FINDINGS Alignment: Normal Skull base and vertebrae: Osseous mineralization low normal. Skull base intact.  Vertebral body heights maintained. Disc space narrowing C5-C6 with small  endplate spurs. No fracture, subluxation or bone destruction. Scattered multilevel facet degenerative changes bilaterally. Soft tissues and spinal canal: Prevertebral soft tissues normal thickness. Atherosclerotic calcification of vertebral arteries at skull base, carotid bifurcations, and proximal RIGHT subclavian artery. Disc levels:  No specific abnormalities Upper chest: Lung apices clear Other: N/A IMPRESSION: No acute intracranial abnormalities. LEFT facial laceration with soft tissue gas LEFT periorbital extending into pre maxillary region. Thin calcific density anterior to the LEFT inferior orbital rim, question radiopaque foreign body versus tiny fracture fragment from LEFT inferior orbital rim of; no definite donor site seen. Scattered multilevel degenerative facet degenerative changes of the cervical spine. Degenerative disc disease changes C5-C6. No acute cervical spine abnormalities. Electronically Signed   By: Lavonia Dana M.D.   On: 02/17/2022 09:33    Microbiology: Results for orders placed or performed during the hospital encounter of 02/17/22  Resp Panel by RT-PCR (Flu A&B, Covid) Anterior Nasal Swab     Status: None   Collection Time: 02/17/22  7:56 AM   Specimen: Anterior Nasal Swab  Result Value Ref Range Status   SARS Coronavirus 2 by RT PCR NEGATIVE NEGATIVE Final    Comment: (NOTE) SARS-CoV-2 target nucleic acids are NOT DETECTED.  The SARS-CoV-2 RNA is generally detectable in upper respiratory specimens during the acute phase of infection. The lowest concentration of SARS-CoV-2 viral copies this assay can detect is 138 copies/mL. A negative result does not preclude SARS-Cov-2 infection and should not be used as the sole basis for treatment or other patient management decisions. A negative result may occur with  improper specimen collection/handling, submission of specimen other than nasopharyngeal  swab, presence of viral mutation(s) within the areas targeted by this assay, and inadequate number of viral copies(<138 copies/mL). A negative result must be combined with clinical observations, patient history, and epidemiological information. The expected result is Negative.  Fact Sheet for Patients:  EntrepreneurPulse.com.au  Fact Sheet for Healthcare Providers:  IncredibleEmployment.be  This test is no t yet approved or cleared by the [place] FDA and  has been authorized for detection and/or diagnosis of SARS-CoV-2 by FDA under an Emergency Use Authorization (EUA). This EUA will remain  in effect (meaning this test can be used) for the duration of the COVID-19 declaration under Section 564(b)(1) of the Act, 21 U.S.C.section 360bbb-3(b)(1), unless the authorization is terminated  or revoked sooner.       Influenza A by PCR NEGATIVE NEGATIVE Final   Influenza B by PCR NEGATIVE NEGATIVE Final    Comment: (NOTE) The Xpert Xpress SARS-CoV-2/FLU/RSV plus assay is intended as an aid in the diagnosis of influenza from Nasopharyngeal swab specimens and should not be used as a sole basis for treatment. Nasal washings and aspirates are unacceptable for Xpert Xpress SARS-CoV-2/FLU/RSV testing.  Fact Sheet for Patients: EntrepreneurPulse.com.au  Fact Sheet for Healthcare Providers: IncredibleEmployment.be  This test is not yet approved or cleared by the [place] FDA and has been authorized for detection and/or diagnosis of SARS-CoV-2 by FDA under an Emergency Use Authorization (EUA). This EUA will remain in effect (meaning this test can be used) for the duration of the COVID-19 declaration under Section 564(b)(1) of the Act, 21 U.S.C. section 360bbb-3(b)(1), unless the authorization is terminated or revoked.  Performed at [facility], [address], [place], [place] [address]   Surgical pcr screen      Status: None   Collection Time: 02/17/22  4:07 PM   Specimen: Nasal Mucosa; Nasal Swab  Result Value Ref  Range Status   MRSA, PCR NEGATIVE NEGATIVE Final   Staphylococcus aureus NEGATIVE NEGATIVE Final    Comment: (NOTE) The Xpert SA Assay (FDA approved for NASAL specimens in patients 109 years of age and older), is one component of a comprehensive surveillance program. It is not intended to diagnose infection nor to guide or monitor treatment. Performed at [facility], [place] [address], [place], [place] [address]   Gastrointestinal Panel by PCR , Stool     Status: None   Collection Time: 02/17/22  6:50 PM   Specimen: Stool  Result Value Ref Range Status   Campylobacter species NOT DETECTED NOT DETECTED Final   Plesimonas shigelloides NOT DETECTED NOT DETECTED Final   Salmonella species NOT DETECTED NOT DETECTED Final   Yersinia enterocolitica NOT DETECTED NOT DETECTED Final   Vibrio species NOT DETECTED NOT DETECTED Final   Vibrio cholerae NOT DETECTED NOT DETECTED Final   Enteroaggregative E coli (EAEC) NOT DETECTED NOT DETECTED Final   Enteropathogenic E coli (EPEC) NOT DETECTED NOT DETECTED Final   Enterotoxigenic E coli (ETEC) NOT DETECTED NOT DETECTED Final   Shiga like toxin producing E coli (STEC) NOT DETECTED NOT DETECTED Final   Shigella/Enteroinvasive E coli (EIEC) NOT DETECTED NOT DETECTED Final   Cryptosporidium NOT DETECTED NOT DETECTED Final   Cyclospora cayetanensis NOT DETECTED NOT DETECTED Final   Entamoeba histolytica NOT DETECTED NOT DETECTED Final   Giardia lamblia NOT DETECTED NOT DETECTED Final   Adenovirus F40/41 NOT DETECTED NOT DETECTED Final   Astrovirus NOT DETECTED NOT DETECTED Final   Norovirus GI/GII NOT DETECTED NOT DETECTED Final   Rotavirus A NOT DETECTED NOT DETECTED Final   Sapovirus (I, II, IV, and V) NOT DETECTED NOT DETECTED Final    Comment: Performed at [facility], [address] [place], [place] [address]  Urine  Culture     Status: None   Collection Time: 02/17/22 10:44 PM   Specimen: Urine, Catheterized  Result Value Ref Range Status   Specimen Description URINE, CATHETERIZED  Final   Special Requests NONE  Final   Culture   Final    NO GROWTH Performed at [facility], [address] [place], [place] [address]    Report Status 02/18/2022 FINAL  Final  Culture, blood (Routine X 2) w Reflex to ID Panel     Status: None   Collection Time: 02/18/22 12:30 AM   Specimen: BLOOD RIGHT HAND  Result Value Ref Range Status   Specimen Description BLOOD RIGHT HAND  Final   Special Requests   Final    BOTTLES DRAWN AEROBIC AND ANAEROBIC Blood Culture results may not be optimal due to an inadequate volume of blood received in culture bottles   Culture   Final    NO GROWTH 5 DAYS Performed at [facility], [place] [address], [place], [place] [address]    Report Status 02/23/2022 FINAL  Final  Culture, blood (Routine X 2) w Reflex to ID Panel     Status: None   Collection Time: 02/18/22 12:54 AM   Specimen: BLOOD LEFT HAND  Result Value Ref Range Status   Specimen Description BLOOD LEFT HAND  Final   Special Requests   Final    BOTTLES DRAWN AEROBIC AND ANAEROBIC Blood Culture results may not be optimal due to an inadequate volume of blood received in culture bottles   Culture   Final    NO GROWTH 5 DAYS Performed at [facility], [address] [place], [place] [address]    Report Status 02/23/2022 FINAL  Final  C Difficile Quick Screen w PCR reflex     Status: Abnormal   Collection Time: 02/18/22 11:30 AM   Specimen: STOOL  Result Value Ref Range Status   C Diff antigen POSITIVE (A) NEGATIVE Final   C Diff toxin NEGATIVE NEGATIVE Final   C Diff interpretation Results are indeterminate. See PCR results.  Final    Comment: Performed at [facility], [place] [address] [place], [place] [address]  C. Diff by PCR, Reflexed     Status: Abnormal   Collection Time: 02/18/22 11:30  AM  Result Value Ref Range Status   Toxigenic C. Difficile by PCR POSITIVE (A) NEGATIVE Final    Comment: Positive for toxigenic C. difficile with little to no toxin production. Only treat if clinical presentation suggests symptomatic illness. CRITICAL RESULT CALLED TO, READ BACK BY AND VERIFIED WITH: RN Fabio Neighbors [phone] '@[id number]'$  FH Performed at [facility], [address], [place], [place] [address]     Labs: CBC: Recent Labs  Lab 02/27/22 0403 03/02/22 0904  WBC 7.9 7.1  NEUTROABS  --  4.5  HGB 10.6* 10.7*  HCT 33.0* 33.1*  MCV 95.4 96.5  PLT 280 569   Basic Metabolic Panel: Recent Labs  Lab 02/26/22 0210 02/27/22 0403 03/02/22 0904  NA 139 141 142  K 3.8 3.5 3.8  CL 107 111 104  CO2 21* 21* 25  GLUCOSE 109* 109* 168*  BUN 8 5* 12  CREATININE 1.37* 1.31* 1.27*  CALCIUM 8.8* 8.8* 9.3  MG 2.0 2.3  --   PHOS 3.1 3.2  --    Liver Function Tests: Recent Labs  Lab 02/26/22 0210 02/27/22 0403  ALBUMIN 2.9* 3.0*   CBG: Recent Labs  Lab 03/03/22 1119 03/03/22 1611 03/03/22 2106 03/04/22 0733 03/04/22 1120  GLUCAP 155* 146* 241* 117* 244*    Discharge time spent: 42 minutes.   Signed: Hosie Poisson, MD [facility] 03/04/2022

## 2022-03-05 NOTE — Progress Notes (Signed)
16 Days Post-Op  Subjective:  POD # 15 SP repair of complex facial laceration by Dr. Marla Roe on 02/17/22 PT doing well with no significant complaints    Objective: Vital signs in last 24 hours:   Last BM Date : 03/04/22  Intake/Output from previous day: 06/21 0701 - 06/22 0700 In: 480 [P.O.:480] Out: 600 [Urine:600] Intake/Output this shift: Total I/O In: 480 [P.O.:480] Out: 1570 [Urine:1570]   Resting comfortably  Dressing in place No surrounding redness, warmth, or drainage to repair site  Lab Results:     Latest Ref Rng & Units 03/02/2022    9:04 AM 02/27/2022    4:03 AM 02/25/2022    1:52 AM  CBC  WBC 4.0 - 10.5 K/uL 7.1  7.9  7.6   Hemoglobin 12.0 - 15.0 g/dL 10.7  10.6  10.6   Hematocrit 36.0 - 46.0 % 33.1  33.0  33.8   Platelets 150 - 400 K/uL 377  280  247     BMET No results for input(s): "NA", "K", "CL", "CO2", "GLUCOSE", "BUN", "CREATININE", "CALCIUM" in the last 72 hours. PT/INR No results for input(s): "LABPROT", "INR" in the last 72 hours. ABG No results for input(s): "PHART", "HCO3" in the last 72 hours.  Invalid input(s): "PCO2", "PO2"  Studies/Results: No results found.  Anti-infectives: Anti-infectives (From admission, onward)    Start     Dose/Rate Route Frequency Ordered Stop   02/20/22 1415  vancomycin (VANCOCIN) capsule 125 mg        125 mg Oral 4 times daily 02/20/22 1321 03/02/22 1359   02/18/22 0600  ceFAZolin (ANCEF) IVPB 2g/100 mL premix  Status:  Discontinued        2 g 200 mL/hr over 30 Minutes Intravenous On call to O.R. 02/17/22 1835 02/17/22 1955   02/17/22 1830  cefTRIAXone (ROCEPHIN) 2 g in sodium chloride 0.9 % 100 mL IVPB  Status:  Discontinued        2 g 200 mL/hr over 30 Minutes Intravenous Every 24 hours 02/17/22 1828 02/20/22 1316   02/17/22 1708  ceFAZolin 1 g / gentamicin 80 mg in NS 500 mL surgical irrigation  Status:  Discontinued          As needed 02/17/22 1709 02/17/22 1824   02/17/22 0800  ceFAZolin (ANCEF)  IVPB 2g/100 mL premix        2 g 200 mL/hr over 30 Minutes Intravenous  Once 02/17/22 0757 02/17/22 0851       Assessment/Plan: s/p Procedure(s): REPAIR LEFT CHEEK COMPLEX LACERATION WITH MYRIAD PLACEMENT   Sutures removed at bedside The incision is healing well with no significant issues at this time Dressing applied The patient will need to follow up in our clinic for further discussion and ongoing management She understood today's plan with no further questions    LOS: 15 days    Elmer Ramp, PA-C 03/05/2022

## 2022-03-11 DIAGNOSIS — E1122 Type 2 diabetes mellitus with diabetic chronic kidney disease: Secondary | ICD-10-CM | POA: Diagnosis not present

## 2022-03-11 DIAGNOSIS — I10 Essential (primary) hypertension: Secondary | ICD-10-CM | POA: Diagnosis not present

## 2022-03-11 DIAGNOSIS — D649 Anemia, unspecified: Secondary | ICD-10-CM | POA: Diagnosis not present

## 2022-03-12 DIAGNOSIS — E1122 Type 2 diabetes mellitus with diabetic chronic kidney disease: Secondary | ICD-10-CM | POA: Diagnosis not present

## 2022-03-12 NOTE — Progress Notes (Addendum)
Patient is a 71 year old female who presented to [facility] ER on 02/17/2022 after falling and hitting her face on a piece of furniture.  Patient was found to have a resulting wound to the left cheek with some missing skin and soft tissue.  Patient was taken to the operating room on 02/17/2022 with Dr. Marla Roe for complex repair of left cheek laceration, cheek tissue advancement, lower lid laceration repair and nose laceration repair.   Patient was seen in the hospital by plastic surgery team on 03/04/2022.  Patient was doing well with no significant complaints.  There is no surrounding redness, warmth or drainage at the repair site.  The incision was healing well and the sutures were removed at bedside.  Patient presents to the clinic for follow-up.  Today, patient reports she is doing well.  She reports she feels her eye gets matted sometimes in the nighttime.  She denies any fevers or chills.  Patient states that she has had changes in her vision after the accident, but is not changed since the accident itself.  She denies any other issues at this time.  On exam, patient is sitting upright in no acute distress.  Sorbact is in place over open aspect of the incision.  There appears to be some mild swelling and redness near the Sorbact. There is some mild swelling noted underneath the eye. Incisions to inferior and lateral aspects of the Sorbact are intact.  There are some sutures in place.  The eye appears slightly irritated.  EOMs are intact.  There is no pain on EOMs.  Pupils are equal round and reactive.  Discussed with patient that I will start her on some antibiotics.  Discussed with patient that she can do a small amount of K-Y jelly on top of the Sorbact then cover it with gauze and Medipore tape.  Discussed with patient that she can do a little bit of Vaseline over the inferior and lateral aspects of the incision.  I recommended to the patient that she should not drive at this time since she has  had issues with her vision since the accident.  Will call in Bactrim and cefdinir for oral antibiotics.  Will also call in Polytrim eyedrops for the eye.  Discussed with patient that if her vision worsens, she notices purulent drainage, she notices more swelling of her eye, or she develops fevers or chills, she should go to the emergency room.  Patient acknowledged.  Pictures were obtained of the patient and placed in the patient's chart with the patient's permission.  Patient to follow up next week   Patient referred to ophthalmology   Discussed plan with Dr. Marla Roe

## 2022-03-13 ENCOUNTER — Encounter: Payer: Self-pay | Admitting: Student

## 2022-03-13 ENCOUNTER — Ambulatory Visit (INDEPENDENT_AMBULATORY_CARE_PROVIDER_SITE_OTHER): Payer: Medicare Other | Admitting: Student

## 2022-03-13 DIAGNOSIS — S0993XA Unspecified injury of face, initial encounter: Secondary | ICD-10-CM

## 2022-03-13 DIAGNOSIS — W01190A Fall on same level from slipping, tripping and stumbling with subsequent striking against furniture, initial encounter: Secondary | ICD-10-CM

## 2022-03-13 DIAGNOSIS — S01401A Unspecified open wound of right cheek and temporomandibular area, initial encounter: Secondary | ICD-10-CM

## 2022-03-13 MED ORDER — POLYMYXIN B-TRIMETHOPRIM 10000-0.1 UNIT/ML-% OP SOLN
1.0000 [drp] | OPHTHALMIC | 0 refills | Status: AC
Start: 2022-03-13 — End: 2022-03-20

## 2022-03-13 MED ORDER — CEFDINIR 300 MG PO CAPS
300.0000 mg | ORAL_CAPSULE | Freq: Two times a day (BID) | ORAL | 0 refills | Status: AC
Start: 2022-03-13 — End: 2022-03-20

## 2022-03-13 MED ORDER — SULFAMETHOXAZOLE-TRIMETHOPRIM 800-160 MG PO TABS: 1.0000 | ORAL_TABLET | Freq: Two times a day (BID) | ORAL | 0 refills | Status: AC

## 2022-03-18 ENCOUNTER — Telehealth: Payer: Self-pay | Admitting: Student

## 2022-03-18 ENCOUNTER — Ambulatory Visit: Payer: Medicare Other | Admitting: Student

## 2022-03-18 NOTE — Telephone Encounter (Signed)
Patient was a no-show today for her follow-up appointment. I called the patient and she did not answer. Voicemail was left. I called the patient's sister and she did not answer.  Voicemail was left.

## 2022-03-18 NOTE — Progress Notes (Deleted)
Patient is a 71 year old female who presented to the [facility] ER on 02/17/2022 after falling and hitting her face on a piece of furniture.  Patient was found to have resulting wound to the left cheek with some missing skin and soft tissue.  Patient was taken to the operating room on 02/17/2022 with Dr. Marla Roe for complex repair of left cheek laceration, cheek tissue advancement, lower lid laceration repair and nose laceration repair.  Patient presents for postoperative follow-up.  Patient was last seen in the clinic on 03/13/2022.  At this visit, patient reported she was doing well.  She did state that she felt her left eye would get matted at night.  She denied any fevers or chills.  On exam, the Sorbact was in place.  There did appear to be some swelling and redness near the Sorbact and underneath the eye.  EOMs were intact and there is no pain with EOMs.  Patient was called in antibiotics.  Today,

## 2022-03-19 DIAGNOSIS — I10 Essential (primary) hypertension: Secondary | ICD-10-CM | POA: Diagnosis not present

## 2022-03-19 DIAGNOSIS — K219 Gastro-esophageal reflux disease without esophagitis: Secondary | ICD-10-CM | POA: Diagnosis not present

## 2022-03-19 DIAGNOSIS — R197 Diarrhea, unspecified: Secondary | ICD-10-CM | POA: Diagnosis not present

## 2022-03-19 DIAGNOSIS — M79673 Pain in unspecified foot: Secondary | ICD-10-CM | POA: Diagnosis not present

## 2022-03-19 DIAGNOSIS — E782 Mixed hyperlipidemia: Secondary | ICD-10-CM | POA: Diagnosis not present

## 2022-03-19 DIAGNOSIS — E1122 Type 2 diabetes mellitus with diabetic chronic kidney disease: Secondary | ICD-10-CM | POA: Diagnosis not present

## 2022-03-19 DIAGNOSIS — R809 Proteinuria, unspecified: Secondary | ICD-10-CM | POA: Diagnosis not present

## 2022-03-19 DIAGNOSIS — I4891 Unspecified atrial fibrillation: Secondary | ICD-10-CM | POA: Diagnosis not present

## 2022-03-19 DIAGNOSIS — D649 Anemia, unspecified: Secondary | ICD-10-CM | POA: Diagnosis not present

## 2022-03-19 DIAGNOSIS — R945 Abnormal results of liver function studies: Secondary | ICD-10-CM | POA: Diagnosis not present

## 2022-03-19 DIAGNOSIS — F411 Generalized anxiety disorder: Secondary | ICD-10-CM | POA: Diagnosis not present

## 2022-03-19 DIAGNOSIS — F5101 Primary insomnia: Secondary | ICD-10-CM | POA: Diagnosis not present

## 2022-03-24 ENCOUNTER — Ambulatory Visit (INDEPENDENT_AMBULATORY_CARE_PROVIDER_SITE_OTHER): Payer: Medicare Other | Admitting: Student

## 2022-03-24 DIAGNOSIS — H02124 Mechanical ectropion of left upper eyelid: Secondary | ICD-10-CM | POA: Diagnosis not present

## 2022-03-24 DIAGNOSIS — S01112A Laceration without foreign body of left eyelid and periocular area, initial encounter: Secondary | ICD-10-CM

## 2022-03-24 DIAGNOSIS — S0993XA Unspecified injury of face, initial encounter: Secondary | ICD-10-CM

## 2022-03-24 DIAGNOSIS — S01412A Laceration without foreign body of left cheek and temporomandibular area, initial encounter: Secondary | ICD-10-CM

## 2022-03-24 DIAGNOSIS — W01190A Fall on same level from slipping, tripping and stumbling with subsequent striking against furniture, initial encounter: Secondary | ICD-10-CM

## 2022-03-24 DIAGNOSIS — H353131 Nonexudative age-related macular degeneration, bilateral, early dry stage: Secondary | ICD-10-CM | POA: Diagnosis not present

## 2022-03-24 DIAGNOSIS — H02125 Mechanical ectropion of left lower eyelid: Secondary | ICD-10-CM | POA: Diagnosis not present

## 2022-03-24 DIAGNOSIS — S0121XA Laceration without foreign body of nose, initial encounter: Secondary | ICD-10-CM

## 2022-03-24 NOTE — Progress Notes (Signed)
Patient is a 71 year old female who presented to [facility] ER on 02/17/2022 after falling and hitting her face on a piece of furniture.  Patient was found to have a resulting wound to the left cheek with some missing skin and soft tissue.  Patient was taken to the operating room on 02/17/2022 with Dr. Marla Roe for complex repair of the left cheek laceration, cheek tissue advancement, lower lid laceration repair and nose laceration repair.  Patient presents for postoperative follow-up.  Patient was last seen in the clinic on 03/13/2022.  At this visit, she was doing well.  She did report she felt her eye was getting matted at times it at night.  She denies fevers or chills.  On exam, there was some mild swelling and redness near the Sorbact.  There is also some mild swelling noted under the eye and some irritation to the eye.  Antibiotics were prescribed for the patient.  Patient was to follow-up on 03/18/2022, but patient did not come to her appointment.  Today the patient notes overall she has been doing well, she does have some moments of tearfulness surrounding the situation.  She notes that she has completed her antibiotics and notes improvement in the redness and swelling, she does continue to have some conjunctival irritation in the left eye and decreased vision which again was present at the time of the initial injury.  She has followed up with her ophthalmologist who she reports indicated no signs of ocular infection.  She notes she has kept the dressing in place and has been using K-Y jelly over the top.  She denies any fever, chills, or drainage.  Upon evaluation the Sorbact was intact, the conjunctival injection was still present although decreased when compared to previous, she does have some lower lid edema again this is decreased when compared to her previous evaluation.  As noted in the pictures she has some minimal irritation along her incision with no significant surrounding redness that would  indicate infection.  Her extraocular movements are intact and pain-free, she has slight decreased vision out of the left eye when compared to the right.  The Sorbact was removed which revealed hypergranulation tissue overlying the previous defect.  The wound was covered with Xeroform gauze and tape.  We would like to see the patient back in our office on Friday this week to discuss further evaluation and management with Dr. Marla Roe.  The patient verbalized understanding and agreement to today's plan and had no further questions or concerns.  She will return immediately if she develops any new or worsening signs or symptoms.  Picture(s) obtained of the patient and placed in the chart were with the patient's or guardian's permission.

## 2022-03-24 NOTE — Progress Notes (Signed)
Patient is a 71 year old female who presented to [facility] ER on 02/17/2022 after falling and hitting her face on a piece of furniture.  Patient was found to have a resulting wound to the left cheek with some missing skin and soft tissue.  Patient was taken to the operating room on 02/17/2022 with Dr. Marla Roe for complex repair of the left cheek laceration, cheek tissue advancement, lower lid laceration repair and nose laceration repair.  Patient presents for postoperative follow-up.  Patient was last seen in the clinic on 03/13/2022.  At this visit, she was doing well.  She did report she felt her eye was getting matted at times it at night.  She denies fevers or chills.  On exam, there was some mild swelling and redness near the Sorbact.  There is also some mild swelling noted under the eye and some irritation to the eye.  Antibiotics were prescribed for the patient.  Patient was to follow-up on 03/18/2022, but patient did not come to her appointment.  Today,

## 2022-03-26 NOTE — H&P (View-Only) (Signed)
Patient is a 71 year old female who presented to [facility], ER on 02/17/2022 after falling and hitting her face on a piece of furniture.  Patient was found to have a resulting wound to the left cheek with some missing skin and soft tissue.  Patient was taken to the operating room on 02/18/2019 for complex repair of the left cheek laceration, cheek tissue advancement, lower lid laceration repair and nose laceration repair.  Patient presents to the clinic for postoperative follow-up.  Patient was last seen in the clinic on 03/24/2022.  At this visit, she reported she was doing well. She reported she completed her antibiotics and that she noted an improvement in the redness and swelling to her left side of her face.  She reported that she had gone to an ophthalmologist who indicated to her that there were no signs of ocular infection.  On exam, there was some conjunctival injection but it was improved from her previous exam.  She also did have some lower lid edema, but it was improved from her prior exam.  The Sorbact was removed and hypergranulation tissue was overlying the previous defect.  Plan was for patient to follow-up in a few days for further evaluation.  Today patient reports she is overall doing well, feels as if she is still having some drainage and crusting within her left eye.  She reports that the redness of her eye is much better.  She reports normal range of motion of her left eye.  She has some questions about surgical options moving forward.  On exam left cheek incision is intact, healing well.  She does have a wound that is approximately 0.4 x 1 cm along the left nasal bridge just inferior to the medial canthus which is hyper granulated to approximately 0.15 cm..  I do not appreciate any scarring or pulling of the lower lid at this point.  I do not appreciate any significant erythema or cellulitic changes other than some mild redness around the incisions.  She does have some residual swelling of  her left periorbital area.  Appears to have normal EOMs on exam.  Pupils are equal and reactive to light.  Conjunctival erythema has improved.   Discussed with patient the hypergranulation tissue would prevent any additional epithelialization.  We used silver nitrate to chemically cauterize the hypergranulation tissue.  Patient tolerated this well.  Recommend continuing with dressing changes daily.  We discussed full-thickness skin graft from either her neck or posterior auricular area.  We discussed possibilities of failure of the graft, need for additional procedures.  All of her questions in regards to this were answered to her content.  Discussed patient's case with Dr. Marla Roe, reports she has discussed with our surgical scheduling team.  I will follow-up on this.  Pictures were obtained of the patient and placed in the chart with the patient's or guardian's permission.

## 2022-03-26 NOTE — Progress Notes (Signed)
Patient is a 71 year old female who presented to [facility] ER on 02/17/2022 after falling and hitting her face on a piece of furniture.  Patient was found to have a resulting wound to the left cheek with some missing skin and soft tissue.  Patient was taken to the operating room on 02/18/2019 for complex repair of the left cheek laceration, cheek tissue advancement, lower lid laceration repair and nose laceration repair.  Patient presents to the clinic for postoperative follow-up.  Patient was last seen in the clinic on 03/24/2022.  At this visit, she reported she was doing well. She reported she completed her antibiotics and that she noted an improvement in the redness and swelling to her left side of her face.  She reported that she had gone to an ophthalmologist who indicated to her that there were no signs of ocular infection.  On exam, there was some conjunctival injection but it was improved from her previous exam.  She also did have some lower lid edema, but it was improved from her prior exam.  The Sorbact was removed and hypergranulation tissue was overlying the previous defect.  Plan was for patient to follow-up in a few days for further evaluation.  Today patient reports she is overall doing well, feels as if she is still having some drainage and crusting within her left eye.  She reports that the redness of her eye is much better.  She reports normal range of motion of her left eye.  She has some questions about surgical options moving forward.  On exam left cheek incision is intact, healing well.  She does have a wound that is approximately 0.4 x 1 cm along the left nasal bridge just inferior to the medial canthus which is hyper granulated to approximately 0.15 cm..  I do not appreciate any scarring or pulling of the lower lid at this point.  I do not appreciate any significant erythema or cellulitic changes other than some mild redness around the incisions.  She does have some residual swelling of  her left periorbital area.  Appears to have normal EOMs on exam.  Pupils are equal and reactive to light.  Conjunctival erythema has improved.   Discussed with patient the hypergranulation tissue would prevent any additional epithelialization.  We used silver nitrate to chemically cauterize the hypergranulation tissue.  Patient tolerated this well.  Recommend continuing with dressing changes daily.  We discussed full-thickness skin graft from either her neck or posterior auricular area.  We discussed possibilities of failure of the graft, need for additional procedures.  All of her questions in regards to this were answered to her content.  Discussed patient's case with Dr. Marla Roe, reports she has discussed with our surgical scheduling team.  I will follow-up on this.  Pictures were obtained of the patient and placed in the chart with the patient's or guardian's permission.

## 2022-03-27 ENCOUNTER — Telehealth: Payer: Self-pay | Admitting: Plastic Surgery

## 2022-03-27 ENCOUNTER — Other Ambulatory Visit: Payer: Self-pay

## 2022-03-27 ENCOUNTER — Encounter: Payer: Self-pay | Admitting: Student

## 2022-03-27 ENCOUNTER — Ambulatory Visit (INDEPENDENT_AMBULATORY_CARE_PROVIDER_SITE_OTHER): Payer: Medicare Other | Admitting: Surgical

## 2022-03-27 ENCOUNTER — Encounter (HOSPITAL_BASED_OUTPATIENT_CLINIC_OR_DEPARTMENT_OTHER): Payer: Self-pay | Admitting: Plastic Surgery

## 2022-03-27 DIAGNOSIS — S01412A Laceration without foreign body of left cheek and temporomandibular area, initial encounter: Secondary | ICD-10-CM

## 2022-03-27 DIAGNOSIS — S0993XA Unspecified injury of face, initial encounter: Secondary | ICD-10-CM

## 2022-03-27 DIAGNOSIS — Z719 Counseling, unspecified: Secondary | ICD-10-CM

## 2022-03-27 DIAGNOSIS — W01190A Fall on same level from slipping, tripping and stumbling with subsequent striking against furniture, initial encounter: Secondary | ICD-10-CM

## 2022-03-27 NOTE — Telephone Encounter (Signed)
Left message for patient advising of surgery date 04/06/2022 at noon. Advised patient to arrive 1.5 hour early. Advised patient to call in to get her pre and post op appts as well.

## 2022-03-30 ENCOUNTER — Encounter: Payer: Self-pay | Admitting: Internal Medicine

## 2022-03-30 ENCOUNTER — Ambulatory Visit (INDEPENDENT_AMBULATORY_CARE_PROVIDER_SITE_OTHER): Payer: Medicare Other | Admitting: Student

## 2022-03-30 VITALS — BP 135/65 | HR 79 | Ht 61.0 in | Wt 134.4 lb

## 2022-03-30 DIAGNOSIS — S01412A Laceration without foreign body of left cheek and temporomandibular area, initial encounter: Secondary | ICD-10-CM

## 2022-03-30 DIAGNOSIS — S0993XA Unspecified injury of face, initial encounter: Secondary | ICD-10-CM

## 2022-03-30 DIAGNOSIS — W01190A Fall on same level from slipping, tripping and stumbling with subsequent striking against furniture, initial encounter: Secondary | ICD-10-CM

## 2022-03-30 MED ORDER — CEPHALEXIN 500 MG PO CAPS: 500.0000 mg | ORAL_CAPSULE | Freq: Four times a day (QID) | ORAL | 0 refills | Status: AC

## 2022-03-30 MED ORDER — ONDANSETRON HCL 4 MG PO TABS: 4.0000 mg | ORAL_TABLET | Freq: Three times a day (TID) | ORAL | 0 refills | Status: DC | PRN
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
## 2022-03-30 NOTE — Progress Notes (Signed)
[facility] DEVICE PROGRAMMING  Patient Information: Name:  DEVYNNE STURDIVANT  DOB:  12/12/1950  MRN:  [id number]    Planned Procedure:  Full thickness skin graft left cheek  Surgeon:  Dr Marla Roe  Date of Procedure:  04-06-22  Cautery will be used.  Position during surgery:  supine   Please send documentation back to:  [place] (Fax # [phone])  Device Information:  Clinic EP Physician:  Cristopher Peru, MD   Device Type:  Pacemaker Manufacturer and Phone #:  Medtronic: [id number] Pacemaker Dependent?:  No. Date of Last Device Check:  02/19/22 Normal Device Function?:  Yes.    Electrophysiologist's Recommendations:  Have magnet available. Provide continuous ECG monitoring when magnet is used or reprogramming is to be performed.  Procedure may interfere with device function.  Magnet should be placed over device during procedure.  Per Device Clinic Standing Orders, Simone Curia, RN  4:58 PM 03/30/2022

## 2022-03-31 ENCOUNTER — Encounter (HOSPITAL_BASED_OUTPATIENT_CLINIC_OR_DEPARTMENT_OTHER)
Admission: RE | Admit: 2022-03-31 | Discharge: 2022-03-31 | Disposition: A | Discharge status: Home / Self Care | Payer: Medicare Other | Source: Ambulatory Visit | Attending: Plastic Surgery | Admitting: Plastic Surgery

## 2022-03-31 ENCOUNTER — Telehealth: Payer: Self-pay

## 2022-03-31 DIAGNOSIS — Z01812 Encounter for preprocedural laboratory examination: Secondary | ICD-10-CM | POA: Insufficient documentation

## 2022-03-31 LAB — BASIC METABOLIC PANEL
Anion gap: 8 (ref 5–15)
BUN: 27 mg/dL — ABNORMAL HIGH (ref 8–23)
CO2: 24 mmol/L (ref 22–32)
Calcium: 9.2 mg/dL (ref 8.9–10.3)
Chloride: 107 mmol/L (ref 98–111)
Creatinine, Ser: 1.81 mg/dL — ABNORMAL HIGH (ref 0.44–1.00)
GFR, Estimated: 30 mL/min — ABNORMAL LOW (ref >60)
Glucose, Bld: 198 mg/dL — ABNORMAL HIGH (ref 70–99)
Potassium: 5 mmol/L (ref 3.5–5.1)
Sodium: 139 mmol/L (ref 135–145)

## 2022-03-31 NOTE — Telephone Encounter (Signed)
Faxed for cardiac clearance and to hold ASA 7 days prior to surgery to Dr. Crissie Sickles

## 2022-04-02 ENCOUNTER — Encounter: Payer: Self-pay | Admitting: Student

## 2022-04-02 ENCOUNTER — Telehealth: Payer: Self-pay | Admitting: *Deleted

## 2022-04-02 ENCOUNTER — Telehealth: Payer: Self-pay

## 2022-04-02 NOTE — Telephone Encounter (Signed)
Primary Cardiologist:Gregg Lovena Le, MD  She is overdue for an office visit, however surgery is next week. Preoperative team, please contact this patient and set up a phone call appointment for further preoperative risk assessment. Please obtain consent and complete medication review. Thank you for your help.   There is no specific cardiac indication for aspirin. She may hold aspirin for 7 days prior to surgery per our current records.   Emmaline Life, NP-C    04/02/2022, 3:39 PM [place] [address] Office [phone] Fax [phone]

## 2022-04-02 NOTE — Telephone Encounter (Signed)
  Patient Consent for Virtual Visit        [name] has provided verbal consent on 04/02/2022 for a virtual visit (video or telephone).   CONSENT FOR VIRTUAL VISIT FOR:  [name]  By participating in this virtual visit I agree to the following:  I hereby voluntarily request, consent and authorize [facility] and its employed or contracted physicians, physician assistants, nurse practitioners or other licensed health care professionals (the Practitioner), to provide me with telemedicine health care services (the "Services") as deemed necessary by the treating Practitioner. I acknowledge and consent to receive the Services by the Practitioner via telemedicine. I understand that the telemedicine visit will involve communicating with the Practitioner through live audiovisual communication technology and the disclosure of certain medical information by electronic transmission. I acknowledge that I have been given the opportunity to request an in-person assessment or other available alternative prior to the telemedicine visit and am voluntarily participating in the telemedicine visit.  I understand that I have the right to withhold or withdraw my consent to the use of telemedicine in the course of my care at any time, without affecting my right to future care or treatment, and that the Practitioner or I may terminate the telemedicine visit at any time. I understand that I have the right to inspect all information obtained and/or recorded in the course of the telemedicine visit and may receive copies of available information for a reasonable fee.  I understand that some of the potential risks of receiving the Services via telemedicine include:  Delay or interruption in medical evaluation due to technological equipment failure or disruption; Information transmitted may not be sufficient (e.g. poor resolution of images) to allow for appropriate medical decision making by the Practitioner;  and/or  In rare instances, security protocols could fail, causing a breach of personal health information.  Furthermore, I acknowledge that it is my responsibility to provide information about my medical history, conditions and care that is complete and accurate to the best of my ability. I acknowledge that Practitioner's advice, recommendations, and/or decision may be based on factors not within their control, such as incomplete or inaccurate data provided by me or distortions of diagnostic images or specimens that may result from electronic transmissions. I understand that the practice of medicine is not an exact science and that Practitioner makes no warranties or guarantees regarding treatment outcomes. I acknowledge that a copy of this consent can be made available to me via my patient portal ([facility]), or I can request a printed copy by calling the office of [place].    I understand that my insurance will be billed for this visit.   I have read or had this consent read to me. I understand the contents of this consent, which adequately explains the benefits and risks of the Services being provided via telemedicine.  I have been provided ample opportunity to ask questions regarding this consent and the Services and have had my questions answered to my satisfaction. I give my informed consent for the services to be provided through the use of telemedicine in my medical care

## 2022-04-02 NOTE — Telephone Encounter (Signed)
Spoke with patient who agreed to do a tele visit 7/21 at 1:40 per Christen Bame, NP. Med rec and consent have been done. Patient thanked me for the call.

## 2022-04-02 NOTE — Progress Notes (Signed)
I spoke with the patient's PCP, Valentino Nose, NP, yesterday regarding patient's postoperative pain management. Per Valentino Nose, she is okay with our clinic managing her postoperative pain medication. She states oxycodone is okay to prescribe to the patient. She states patient is to hold her current pain medication, Norco, while taking oxycodone.   I attempted to call the patient with the updated plan, but she did not answer. A voicemail was left.

## 2022-04-02 NOTE — Telephone Encounter (Signed)
   Pre-operative Risk Assessment    Patient Name: [name]  DOB: [birth year] MRN: [id number]      Request for Surgical Clearance    Procedure:   Skin Graft Full Thickness  Date of Surgery:  Clearance 04/06/22                                 Surgeon:  Dr. Audelia Hives Surgeon's Group or Practice Name:  [facility] Phone number:  [phone] Fax number:  [phone]   Type of Clearance Requested:   - Pharmacy:  Hold Aspirin X 7 days.   Type of Anesthesia:  General    Additional requests/questions:    Signed, Greer Ee   04/02/2022, 2:46 PM

## 2022-04-03 ENCOUNTER — Ambulatory Visit (INDEPENDENT_AMBULATORY_CARE_PROVIDER_SITE_OTHER): Payer: Medicare Other | Admitting: Nurse Practitioner

## 2022-04-03 ENCOUNTER — Other Ambulatory Visit: Payer: Self-pay | Admitting: Student

## 2022-04-03 ENCOUNTER — Encounter: Payer: Self-pay | Admitting: Nurse Practitioner

## 2022-04-03 DIAGNOSIS — Z01810 Encounter for preprocedural cardiovascular examination: Secondary | ICD-10-CM | POA: Diagnosis not present

## 2022-04-03 MED ORDER — OXYCODONE HCL 5 MG PO TABS: 5.0000 mg | ORAL_TABLET | Freq: Three times a day (TID) | ORAL | 0 refills | Status: DC | PRN

## 2022-04-03 NOTE — Progress Notes (Signed)
Called patient again today to discuss postoperative pain medications.  I discussed with the patient that I talked with her primary care provider who prescribes her pain medications, and that we will manage her postoperative pain.  I discussed with the patient that she needs to hold her Norco while she is taking the oxycodone that we prescribed her.  Patient expressed understanding.  Patient also states she had a televisit with her cardiologist today.  Per cardiology's note, patient is at an acceptable risk for the planned procedure without any further cardiovascular testing.  Patient is okay to continue to hold her aspirin until surgery, and can resume her aspirin after surgery.

## 2022-04-03 NOTE — Progress Notes (Signed)
Virtual Visit via Telephone Note   Because of [name] co-morbid illnesses, she is at least at moderate risk for complications without adequate follow up.  This format is felt to be most appropriate for this patient at this time.  The patient did not have access to video technology/had technical difficulties with video requiring transitioning to audio format only (telephone).  All issues noted in this document were discussed and addressed.  No physical exam could be performed with this format.  Please refer to the patient's chart for her consent to telehealth for [facility].  Evaluation Performed:  Preoperative cardiovascular risk assessment _____________   Date:  04/03/2022   Patient ID:  [name], DOB May 15, 1951, MRN [id number] Patient Location:  Home Provider location:   Office  Primary Care Provider:  Celene Squibb, MD Primary Cardiologist:  Cristopher Peru, MD  Chief Complaint / Patient Profile   71 y.o. y/o female with a h/o chronic systolic heart failure and LBBB s/p BiV ICD downgraded to BiV PPM, hypertension,  who is pending skin graft full thickness and presents today for telephonic preoperative cardiovascular risk assessment.  Past Medical History    Past Medical History:  Diagnosis Date   Anxiety    Cardiomyopathy    Cervical high risk HPV (human papillomavirus) test positive 01/30/2015   Chronic alcohol abuse 06/09/2020   Chronic constipation    Chronic lower back pain    Constipation 01/30/2015   Depression    GERD (gastroesophageal reflux disease)    Heart failure    Chronic combined   HTN (hypertension)    Hypercholesteremia    Neurogenic bladder    2008 nerve damage s/p back ssurgery   Presence of permanent cardiac pacemaker    Type II diabetes mellitus ([place])    Past Surgical History:  Procedure Laterality Date   ACHILLES TENDON SURGERY Right 06/2012   BACK SURGERY  2005   BI-VENTRICULAR IMPLANTABLE CARDIOVERTER DEFIBRILLATOR   (CRT-D)  12-18-2013   upgrade of previously implanted dual chamber pacemaker to MDT CRTD with RV lead revision by Dr Placido Sou PACEMAKER UPGRADE  12/18/2013   w/defibrillator   BIV PACEMAKER GENERATOR CHANGE OUT N/A 12/18/2013   Procedure: BIV PACEMAKER GENERATOR CHANGE OUT;  Surgeon: Evans Lance, MD;  Location: [facility] CATH LAB;  Service: Cardiovascular;  Laterality: N/A;   BIV PACEMAKER INSERTION CRT-P N/A 02/22/2020   Procedure: DOWNGRADE BIV PACEMAKER INSERTION CRT-P;  Surgeon: Evans Lance, MD;  Location: [place] CV LAB;  Service: Cardiovascular;  Laterality: N/A;   BREAST BIOPSY Left 1973   benign   BREAST LUMPECTOMY Left [place]   CHOLECYSTECTOMY     GASTROCNEMIUS RECESSION  06/24/2012   Procedure: GASTROCNEMIUS SLIDE;  Surgeon: Colin Rhein, MD;  Location: [facility];  Service: Orthopedics;  Laterality: Right;   HERNIA REPAIR     LACERATION REPAIR Left 02/17/2022   Procedure: REPAIR LEFT CHEEK COMPLEX LACERATION WITH MYRIAD PLACEMENT;  Surgeon: Wallace Going, DO;  Location: [place];  Service: Plastics;  Laterality: Left;   LEAD REVISION N/A 12/18/2013   Procedure: LEAD REVISION;  Surgeon: Evans Lance, MD;  Location: [facility] CATH LAB;  Service: Cardiovascular;  Laterality: N/A;   PACEMAKER INSERTION  2006   Dual chamber MDT pacemaker implanted - subsequent upgrade to CRTD 12-2013   POSTERIOR LUMBAR FUSION  2006; 2008   SIGMOIDOSCOPY  MAY 2011 w/ PROP   iTCS-->FSIG-poor bowel prep  TUBAL LIGATION  ~ 1984   UPPER GASTROINTESTINAL ENDOSCOPY  MAY 2011 w/ PROP   MILD GASTRITIS 2o ETOH    Allergies  No Known Allergies  History of Present Illness    [name] is a 71 y.o. female who presents via audio/video conferencing for a telehealth visit today.  Pt was last seen in cardiology clinic on 01/14/2021 by Dr. Lovena Le.  At that time [name] was doing well.  The patient is now pending procedure as outlined above. Since her last visit, she  denies chest pain, shortness of breath, lower extremity edema, fatigue, palpitations, melena, hematuria, hemoptysis, diaphoresis, weakness, presyncope, syncope, orthopnea, and PND.   Home Medications    Prior to Admission medications   Medication Sig Start Date End Date Taking? Authorizing Provider  Artificial Tear Solution (SYSTANE CONTACTS OP) Place 1 drop into both eyes 2 (two) times daily.    [provider]  aspirin EC 81 MG tablet Take 81 mg by mouth daily.    [provider]  buPROPion (WELLBUTRIN XL) 300 MG 24 hr tablet Take 300 mg by mouth daily. 06/19/20   [provider]  BYDUREON BCISE 2 MG/0.85ML AUIJ Inject 2 mg into the skin once a week. 02/02/22   [provider]  carvedilol (COREG) 12.5 MG tablet Take 12.5 mg by mouth 2 (two) times daily. 02/02/22   [provider]  clonazePAM (KLONOPIN) 0.25 MG disintegrating tablet Take 1 tablet (0.25 mg total) by mouth 2 (two) times daily as needed for up to 3 days (anxiety). 03/04/22 03/27/22  Hosie Poisson, MD  furosemide (LASIX) 40 MG tablet Take 1 tablet (40 mg total) by mouth daily. Patient taking differently: Take 40 mg by mouth every other day. 06/06/19   Evans Lance, MD  HYDROcodone-acetaminophen [facility]) 10-325 MG tablet Take 1 tablet by mouth every 6 (six) hours as needed. 03/09/22   [provider]  insulin aspart (NOVOLOG) 100 UNIT/ML injection CBG 70 - 120: 0 units  CBG 121 - 150: 1 unit  CBG 151 - 200: 2 units  CBG 201 - 250: 3 units  CBG 251 - 300: 5 units  CBG 301 - 350: 7 units  CBG 351 - 400: 9 units 03/04/22   Hosie Poisson, MD  losartan (COZAAR) 25 MG tablet Take 12.5 mg by mouth daily. 06/19/20   [provider]  Multiple Vitamin (MULTIVITAMIN WITH MINERALS) TABS tablet Take 1 tablet by mouth daily. 03/05/22   Hosie Poisson, MD  omeprazole (PRILOSEC) 20 MG capsule Take 20 mg by mouth daily.    [provider]  ondansetron (ZOFRAN) 4 MG tablet Take 1  tablet (4 mg total) by mouth every 8 (eight) hours as needed for up to 20 doses for nausea or vomiting. 03/30/22   Clance Boll, PA-C  potassium chloride SA (K-DUR,KLOR-CON) 20 MEQ tablet Take 20 mEq by mouth 2 (two) times daily.    [provider]  rosuvastatin (CRESTOR) 40 MG tablet TAKE ONE (1) TABLET BY MOUTH EVERY DAY (STOP ATORVASTATIN) 01/29/20   Corum, Rex Kras, MD  sertraline (ZOLOFT) 100 MG tablet Take 1 tablet (100 mg total) by mouth daily. 03/05/22   Hosie Poisson, MD  tiZANidine (ZANAFLEX) 4 MG tablet Take 1 tablet 3 times a day by oral route as needed.    [provider]  zolpidem (AMBIEN) 10 MG tablet TAKE 1 AND 1/2 TABLETS BY MOUTH AT BEDTIME 12/04/21   [provider]    Physical  Exam    Vital Signs:  [name] does not have vital signs available for review today.  Given telephonic nature of communication, physical exam is limited. AAOx3. NAD. Normal affect.  Speech and respirations are unlabored.  Accessory Clinical Findings    None  Assessment & Plan    1.  Preoperative Cardiovascular Risk Assessment: The patient is doing well from a cardiac perspective. Therefore, based on ACC/AHA guidelines, the patient would be at acceptable risk for the planned procedure without further cardiovascular testing. The patient was advised that if he develops new symptoms prior to surgery to contact our office to arrange for a follow-up visit, and he verbalized understanding. According to the Revised Cardiac Risk Index (RCRI), her Perioperative Risk of Major Cardiac Event is (%): 0.9. Her Functional Capacity in METs is: 7.59 according to the Duke Activity Status Index (DASI). She is aware that she can hold aspirin for 1 week prior to surgery which she is already doing. Advised her to resume as soon as possible after surgery.   A copy of this note will be routed to requesting surgeon.  Time:   Today, I have spent 7 minutes with the patient with telehealth  technology discussing medical history, symptoms, and management plan.     Emmaline Life, NP  04/03/2022, 10:51 AM

## 2022-04-06 ENCOUNTER — Other Ambulatory Visit: Payer: Self-pay

## 2022-04-06 ENCOUNTER — Encounter (HOSPITAL_BASED_OUTPATIENT_CLINIC_OR_DEPARTMENT_OTHER)
Admission: RE | Disposition: A | Discharge status: Home / Self Care | Payer: Self-pay | Source: Home / Self Care | Attending: Plastic Surgery

## 2022-04-06 ENCOUNTER — Ambulatory Visit (HOSPITAL_BASED_OUTPATIENT_CLINIC_OR_DEPARTMENT_OTHER): Payer: Medicare Other | Admitting: Anesthesiology

## 2022-04-06 ENCOUNTER — Ambulatory Visit (HOSPITAL_COMMUNITY)
Admission: RE | Admit: 2022-04-06 | Discharge: 2022-04-06 | Disposition: A | Discharge status: Home / Self Care | Payer: Medicare Other | Attending: Plastic Surgery | Admitting: Plastic Surgery

## 2022-04-06 ENCOUNTER — Encounter (HOSPITAL_BASED_OUTPATIENT_CLINIC_OR_DEPARTMENT_OTHER): Payer: Self-pay | Admitting: Plastic Surgery

## 2022-04-06 DIAGNOSIS — Z794 Long term (current) use of insulin: Secondary | ICD-10-CM | POA: Insufficient documentation

## 2022-04-06 DIAGNOSIS — W19XXXA Unspecified fall, initial encounter: Secondary | ICD-10-CM | POA: Diagnosis not present

## 2022-04-06 DIAGNOSIS — F32A Depression, unspecified: Secondary | ICD-10-CM | POA: Diagnosis not present

## 2022-04-06 DIAGNOSIS — S01402A Unspecified open wound of left cheek and temporomandibular area, initial encounter: Secondary | ICD-10-CM

## 2022-04-06 DIAGNOSIS — Y9389 Activity, other specified: Secondary | ICD-10-CM | POA: Insufficient documentation

## 2022-04-06 DIAGNOSIS — K219 Gastro-esophageal reflux disease without esophagitis: Secondary | ICD-10-CM | POA: Diagnosis not present

## 2022-04-06 DIAGNOSIS — W01190A Fall on same level from slipping, tripping and stumbling with subsequent striking against furniture, initial encounter: Secondary | ICD-10-CM

## 2022-04-06 DIAGNOSIS — I509 Heart failure, unspecified: Secondary | ICD-10-CM | POA: Diagnosis not present

## 2022-04-06 DIAGNOSIS — E119 Type 2 diabetes mellitus without complications: Secondary | ICD-10-CM | POA: Diagnosis not present

## 2022-04-06 DIAGNOSIS — Z950 Presence of cardiac pacemaker: Secondary | ICD-10-CM | POA: Diagnosis not present

## 2022-04-06 DIAGNOSIS — I110 Hypertensive heart disease with heart failure: Secondary | ICD-10-CM | POA: Diagnosis not present

## 2022-04-06 DIAGNOSIS — F419 Anxiety disorder, unspecified: Secondary | ICD-10-CM | POA: Diagnosis not present

## 2022-04-06 DIAGNOSIS — S01412A Laceration without foreign body of left cheek and temporomandibular area, initial encounter: Secondary | ICD-10-CM | POA: Insufficient documentation

## 2022-04-06 DIAGNOSIS — F418 Other specified anxiety disorders: Secondary | ICD-10-CM | POA: Diagnosis not present

## 2022-04-06 HISTORY — DX: Presence of cardiac pacemaker: Z95.0

## 2022-04-06 HISTORY — PX: SKIN FULL THICKNESS GRAFT: SHX442

## 2022-04-06 LAB — GLUCOSE, CAPILLARY
Glucose-Capillary: 175 mg/dL — ABNORMAL HIGH (ref 70–99)
Glucose-Capillary: 154 mg/dL — ABNORMAL HIGH (ref 70–99)

## 2022-04-06 SURGERY — APPLICATION, GRAFT, SKIN, FULL-THICKNESS
Anesthesia: General | Site: Face | Laterality: Left

## 2022-04-06 MED ORDER — SODIUM CHLORIDE 0.9% FLUSH
3.0000 mL | Freq: Two times a day (BID) | INTRAVENOUS | Status: DC
Start: 2022-04-06 — End: 2022-04-06

## 2022-04-06 MED ORDER — DEXAMETHASONE SODIUM PHOSPHATE 10 MG/ML IJ SOLN
INTRAMUSCULAR | Status: DC | PRN
  Administered 2022-04-06: 4 mg via INTRAVENOUS

## 2022-04-06 MED ORDER — LIDOCAINE-EPINEPHRINE 1 %-1:100000 IJ SOLN
INTRAMUSCULAR | Status: DC | PRN
  Administered 2022-04-06: 2 mL

## 2022-04-06 MED ORDER — LACTATED RINGERS IV SOLN: INTRAVENOUS | Status: DC

## 2022-04-06 MED ORDER — CHLORHEXIDINE GLUCONATE CLOTH 2 % EX PADS: 6.0000 | MEDICATED_PAD | Freq: Once | CUTANEOUS | Status: DC

## 2022-04-06 MED ORDER — ONDANSETRON HCL 4 MG/2ML IJ SOLN
INTRAMUSCULAR | Status: DC | PRN
  Administered 2022-04-06: 4 mg via INTRAVENOUS

## 2022-04-06 MED ORDER — PHENYLEPHRINE 80 MCG/ML (10ML) SYRINGE FOR IV PUSH (FOR BLOOD PRESSURE SUPPORT)
PREFILLED_SYRINGE | INTRAVENOUS | Status: AC
  Filled 2022-04-06: qty 10

## 2022-04-06 MED ORDER — LIDOCAINE HCL (CARDIAC) PF 100 MG/5ML IV SOSY
PREFILLED_SYRINGE | INTRAVENOUS | Status: DC | PRN
  Administered 2022-04-06: 40 mg via INTRAVENOUS

## 2022-04-06 MED ORDER — ACETAMINOPHEN 10 MG/ML IV SOLN
1000.0000 mg | Freq: Once | INTRAVENOUS | Status: DC | PRN
  Administered 2022-04-06: 1000 mg via INTRAVENOUS

## 2022-04-06 MED ORDER — 0.9 % SODIUM CHLORIDE (POUR BTL) OPTIME
TOPICAL | Status: DC | PRN
  Administered 2022-04-06: 120 mL

## 2022-04-06 MED ORDER — FENTANYL CITRATE (PF) 100 MCG/2ML IJ SOLN
INTRAMUSCULAR | Status: DC | PRN
  Administered 2022-04-06: 25 ug via INTRAVENOUS

## 2022-04-06 MED ORDER — FENTANYL CITRATE (PF) 100 MCG/2ML IJ SOLN
25.0000 ug | INTRAMUSCULAR | Status: DC | PRN
  Administered 2022-04-06 (×3): 50 ug via INTRAVENOUS

## 2022-04-06 MED ORDER — CEFAZOLIN SODIUM-DEXTROSE 2-4 GM/100ML-% IV SOLN
INTRAVENOUS | Status: AC
  Filled 2022-04-06: qty 100

## 2022-04-06 MED ORDER — PROPOFOL 10 MG/ML IV BOLUS
INTRAVENOUS | Status: DC | PRN
  Administered 2022-04-06: 200 mg via INTRAVENOUS

## 2022-04-06 MED ORDER — ACETAMINOPHEN 10 MG/ML IV SOLN
INTRAVENOUS | Status: AC
  Filled 2022-04-06: qty 100

## 2022-04-06 MED ORDER — FENTANYL CITRATE (PF) 100 MCG/2ML IJ SOLN
INTRAMUSCULAR | Status: AC
  Filled 2022-04-06: qty 2

## 2022-04-06 MED ORDER — BACITRACIN ZINC 500 UNIT/GM EX OINT
TOPICAL_OINTMENT | CUTANEOUS | Status: AC
Start: 2022-04-06 — End: ?
  Filled 2022-04-06: qty 28.35

## 2022-04-06 MED ORDER — MIDAZOLAM HCL 5 MG/5ML IJ SOLN
INTRAMUSCULAR | Status: DC | PRN
  Administered 2022-04-06: 2 mg via INTRAVENOUS

## 2022-04-06 MED ORDER — PHENYLEPHRINE HCL (PRESSORS) 10 MG/ML IV SOLN
INTRAVENOUS | Status: DC | PRN
  Administered 2022-04-06 (×2): 80 ug via INTRAVENOUS

## 2022-04-06 MED ORDER — BUPIVACAINE-EPINEPHRINE (PF) 0.25% -1:200000 IJ SOLN
INTRAMUSCULAR | Status: AC
  Filled 2022-04-06: qty 30

## 2022-04-06 MED ORDER — CEFAZOLIN SODIUM-DEXTROSE 2-4 GM/100ML-% IV SOLN
2.0000 g | INTRAVENOUS | Status: AC
  Administered 2022-04-06: 2 g via INTRAVENOUS

## 2022-04-06 MED ORDER — AMISULPRIDE (ANTIEMETIC) 5 MG/2ML IV SOLN: 10.0000 mg | Freq: Once | INTRAVENOUS | Status: DC | PRN

## 2022-04-06 MED ORDER — ONDANSETRON HCL 4 MG/2ML IJ SOLN
INTRAMUSCULAR | Status: AC
  Filled 2022-04-06: qty 2

## 2022-04-06 MED ORDER — PROPOFOL 10 MG/ML IV BOLUS
INTRAVENOUS | Status: AC
  Filled 2022-04-06: qty 20

## 2022-04-06 MED ORDER — MIDAZOLAM HCL 2 MG/2ML IJ SOLN
INTRAMUSCULAR | Status: AC
  Filled 2022-04-06: qty 2

## 2022-04-06 MED ORDER — ONDANSETRON HCL 4 MG/2ML IJ SOLN: 4.0000 mg | Freq: Once | INTRAMUSCULAR | Status: DC | PRN

## 2022-04-06 SURGICAL SUPPLY — 64 items
ADH SKN CLS APL DERMABOND .7 (GAUZE/BANDAGES/DRESSINGS) ×1
BALL CTTN LRG ABS STRL LF (GAUZE/BANDAGES/DRESSINGS)
BLADE CLIPPER SURG (BLADE) IMPLANT
BLADE HEX COATED 2.75 (ELECTRODE) IMPLANT
BLADE SURG 15 STRL LF DISP TIS (BLADE) ×2 IMPLANT
BLADE SURG 15 STRL SS (BLADE) ×4
BNDG CMPR 75X21 PLY HI ABS (MISCELLANEOUS)
BNDG ELASTIC 2X5.8 VLCR STR LF (GAUZE/BANDAGES/DRESSINGS) IMPLANT
CANISTER SUCT 1200ML W/VALVE (MISCELLANEOUS) IMPLANT
CORD BIPOLAR FORCEPS 12FT (ELECTRODE) IMPLANT
COTTONBALL LRG STERILE PKG (GAUZE/BANDAGES/DRESSINGS) IMPLANT
COVER BACK TABLE 60X90IN (DRAPES) ×3 IMPLANT
COVER MAYO STAND STRL (DRAPES) ×3 IMPLANT
DERMABOND ADVANCED (GAUZE/BANDAGES/DRESSINGS) ×1
DERMABOND ADVANCED .7 DNX12 (GAUZE/BANDAGES/DRESSINGS) IMPLANT
DRAPE U-SHAPE 76X120 STRL (DRAPES) ×1 IMPLANT
ELECT NDL BLADE 2-5/6 (NEEDLE) ×2 IMPLANT
ELECT NEEDLE BLADE 2-5/6 (NEEDLE) IMPLANT
ELECT REM PT RETURN 9FT ADLT (ELECTROSURGICAL) ×2
ELECT REM PT RETURN 9FT PED (ELECTROSURGICAL)
ELECTRODE REM PT RETRN 9FT PED (ELECTROSURGICAL) IMPLANT
ELECTRODE REM PT RTRN 9FT ADLT (ELECTROSURGICAL) IMPLANT
GAUZE SPONGE 4X4 12PLY STRL LF (GAUZE/BANDAGES/DRESSINGS) IMPLANT
GAUZE STRETCH 2X75IN STRL (MISCELLANEOUS) IMPLANT
GAUZE XEROFORM 1X8 LF (GAUZE/BANDAGES/DRESSINGS) ×1 IMPLANT
GAUZE XEROFORM 5X9 LF (GAUZE/BANDAGES/DRESSINGS) IMPLANT
GLOVE BIO SURGEON STRL SZ 6.5 (GLOVE) ×5 IMPLANT
GLOVE BIOGEL PI IND STRL 7.0 (GLOVE) IMPLANT
GLOVE BIOGEL PI INDICATOR 7.0 (GLOVE) ×1
GOWN STRL REUS W/ TWL LRG LVL3 (GOWN DISPOSABLE) ×4 IMPLANT
GOWN STRL REUS W/TWL LRG LVL3 (GOWN DISPOSABLE) ×4
NDL HYPO 27GX1-1/4 (NEEDLE) ×2 IMPLANT
NDL HYPO 30GX1 BEV (NEEDLE) IMPLANT
NEEDLE HYPO 27GX1-1/4 (NEEDLE) ×2 IMPLANT
NEEDLE HYPO 30GX1 BEV (NEEDLE) IMPLANT
NS IRRIG 1000ML POUR BTL (IV SOLUTION) ×1 IMPLANT
PACK BASIN DAY SURGERY FS (CUSTOM PROCEDURE TRAY) ×3 IMPLANT
PENCIL SMOKE EVACUATOR (MISCELLANEOUS) IMPLANT
SHEET MEDIUM DRAPE 40X70 STRL (DRAPES) IMPLANT
SPONGE GAUZE 2X2 8PLY STRL LF (GAUZE/BANDAGES/DRESSINGS) IMPLANT
SPONGE T-LAP 18X18 ~~LOC~~+RFID (SPONGE) IMPLANT
STRIP CLOSURE SKIN 1/2X4 (GAUZE/BANDAGES/DRESSINGS) ×1 IMPLANT
SUCTION FRAZIER HANDLE 10FR (MISCELLANEOUS) ×2
SUCTION TUBE FRAZIER 10FR DISP (MISCELLANEOUS) IMPLANT
SUT CHROMIC 4 0 P 3 18 (SUTURE) IMPLANT
SUT ETHILON 4 0 PS 2 18 (SUTURE) IMPLANT
SUT ETHILON 5 0 P 3 18 (SUTURE)
SUT MNCRL 6-0 UNDY P1 1X18 (SUTURE) ×2 IMPLANT
SUT MNCRL AB 4-0 PS2 18 (SUTURE) IMPLANT
SUT MON AB 5-0 P3 18 (SUTURE) ×1 IMPLANT
SUT MONOCRYL 6-0 P1 1X18 (SUTURE) ×2
SUT NYLON ETHILON 5-0 P-3 1X18 (SUTURE) IMPLANT
SUT PLAIN 5 0 P 3 18 (SUTURE) IMPLANT
SUT PROLENE 5 0 P 3 (SUTURE) IMPLANT
SUT PROLENE 6 0 P 1 18 (SUTURE) IMPLANT
SUT VIC AB 5-0 P-3 18X BRD (SUTURE) IMPLANT
SUT VIC AB 5-0 P3 18 (SUTURE) ×8
SUT VICRYL 4-0 PS2 18IN ABS (SUTURE) IMPLANT
SYR BULB EAR ULCER 3OZ GRN STR (SYRINGE) IMPLANT
SYR CONTROL 10ML LL (SYRINGE) ×3 IMPLANT
TAPE STRIPS DRAPE STRL (GAUZE/BANDAGES/DRESSINGS) IMPLANT
TOWEL GREEN STERILE FF (TOWEL DISPOSABLE) ×3 IMPLANT
TRAY DSU PREP LF (CUSTOM PROCEDURE TRAY) ×3 IMPLANT
TUBE CONNECTING 20X1/4 (TUBING) ×1 IMPLANT

## 2022-04-06 NOTE — Anesthesia Procedure Notes (Signed)
Procedure Name: LMA Insertion Date/Time: 04/06/2022 12:25 PM  Performed by: Lavonia Dana, CRNAPre-anesthesia Checklist: Patient identified, Emergency Drugs available, Suction available and Patient being monitored Patient Re-evaluated:Patient Re-evaluated prior to induction Oxygen Delivery Method: Circle system utilized Preoxygenation: Pre-oxygenation with 100% oxygen Induction Type: IV induction Ventilation: Mask ventilation without difficulty LMA: LMA inserted LMA Size: 4.0 Number of attempts: 1 Airway Equipment and Method: Bite block Placement Confirmation: positive ETCO2 Tube secured with: Tape Dental Injury: Teeth and Oropharynx as per pre-operative assessment

## 2022-04-06 NOTE — Transfer of Care (Signed)
Immediate Anesthesia Transfer of Care Note  Patient: ZAYNAH CHAWLA  Procedure(s) Performed: SKIN GRAFT FULL THICKNESS (Left: Face)  Patient Location: PACU  Anesthesia Type:General  Level of Consciousness: drowsy  Airway & Oxygen Therapy: Patient Spontanous Breathing and Patient connected to face mask oxygen  Post-op Assessment: Report given to RN and Post -op Vital signs reviewed and stable  Post vital signs: Reviewed and stable  Last Vitals:  Vitals Value Taken Time  BP 142/80 04/06/22 1306  Temp    Pulse 87 04/06/22 1310  Resp 12 04/06/22 1310  SpO2 100 % 04/06/22 1310  Vitals shown include unvalidated device data.  Last Pain:  Vitals:   04/06/22 1030  TempSrc: Oral  PainSc: 0-No pain      Patients Stated Pain Goal: 7 (28/00/34 9179)  Complications: No notable events documented.

## 2022-04-06 NOTE — Interval H&P Note (Signed)
History and Physical Interval Note:  04/06/2022 12:07 PM  [name]  has presented today for surgery, with the diagnosis of Facial Trauma.  The various methods of treatment have been discussed with the patient and family. After consideration of risks, benefits and other options for treatment, the patient has consented to  Procedure(s): SKIN GRAFT FULL THICKNESS (Left) as a surgical intervention.  The patient's history has been reviewed, patient examined, no change in status, stable for surgery.  I have reviewed the patient's chart and labs.  Questions were answered to the patient's satisfaction.     Loel Lofty Achaia Garlock

## 2022-04-06 NOTE — Anesthesia Preprocedure Evaluation (Addendum)
Anesthesia Evaluation  Patient identified by MRN, date of birth, ID band Patient awake    Reviewed: Allergy & Precautions, NPO status , Patient's Chart, lab work & pertinent test results  Airway Mallampati: II  TM Distance: >3 FB Neck ROM: Full    Dental no notable dental hx.    Pulmonary neg pulmonary ROS,    Pulmonary exam normal        Cardiovascular hypertension, Pt. on home beta blockers and Pt. on medications +CHF  Normal cardiovascular exam+ pacemaker      Neuro/Psych PSYCHIATRIC DISORDERS Anxiety Depression    GI/Hepatic Neg liver ROS, GERD  Medicated and Controlled,  Endo/Other  diabetes, Insulin Dependent  Renal/GU Renal InsufficiencyRenal disease     Musculoskeletal negative musculoskeletal ROS (+)   Abdominal   Peds  Hematology negative hematology ROS (+)   Anesthesia Other Findings Facial Trauma  Reproductive/Obstetrics                            Anesthesia Physical Anesthesia Plan  ASA: 3  Anesthesia Plan: General   Post-op Pain Management:    Induction: Intravenous  PONV Risk Score and Plan: 3 and Ondansetron, Dexamethasone, Midazolam and Treatment may vary due to age or medical condition  Airway Management Planned: LMA  Additional Equipment:   Intra-op Plan:   Post-operative Plan: Extubation in OR  Informed Consent: I have reviewed the patients History and Physical, chart, labs and discussed the procedure including the risks, benefits and alternatives for the proposed anesthesia with the patient or authorized representative who has indicated his/her understanding and acceptance.     Dental advisory given  Plan Discussed with: CRNA  Anesthesia Plan Comments:        Anesthesia Quick Evaluation

## 2022-04-06 NOTE — Interval H&P Note (Signed)
History and Physical Interval Note:  04/06/2022 12:07 PM  [name]  has presented today for surgery, with the diagnosis of Facial Trauma.  The various methods of treatment have been discussed with the patient and family. After consideration of risks, benefits and other options for treatment, the patient has consented to  Procedure(s): SKIN GRAFT FULL THICKNESS (Left) as a surgical intervention.  The patient's history has been reviewed, patient examined, no change in status, stable for surgery.  I have reviewed the patient's chart and labs.  Questions were answered to the patient's satisfaction.     Loel Lofty [name]

## 2022-04-06 NOTE — Anesthesia Postprocedure Evaluation (Signed)
Anesthesia Post Note  Patient: [name]  Procedure(s) Performed: SKIN GRAFT FULL THICKNESS (Left: Face)     Patient location during evaluation: PACU Anesthesia Type: General Level of consciousness: awake Pain management: pain level controlled Vital Signs Assessment: post-procedure vital signs reviewed and stable Respiratory status: spontaneous breathing, nonlabored ventilation, respiratory function stable and patient connected to nasal cannula oxygen Cardiovascular status: blood pressure returned to baseline and stable Postop Assessment: no apparent nausea or vomiting Anesthetic complications: no   No notable events documented.  Last Vitals:  Vitals:   04/06/22 1415 04/06/22 1435  BP: 140/74 (!) 157/82  Pulse: 78 87  Resp: 12 16  Temp:  (!) 36.4 C  SpO2: 100% 95%    Last Pain:  Vitals:   04/06/22 1435  TempSrc: Oral  PainSc: 2                  Sury Wentworth P Yvonnie Schinke

## 2022-04-06 NOTE — Op Note (Signed)
DATE OF OPERATION: 04/06/2022  LOCATION: [facility]  PREOPERATIVE DIAGNOSIS: left cheek wound  POSTOPERATIVE DIAGNOSIS: Same  PROCEDURE: Full thickness skin graft to left cheek 1 x 1.5 cm  SURGEON: Juneau Doughman Sanger Timothey Dahlstrom, DO  ASSISTANT: Donnamarie Rossetti, PA  EBL: none  CONDITION: Stable  COMPLICATIONS: None  INDICATION: The patient, [name], is a 71 y.o. female born on 17-Oct-1950, is here for treatment of a left cheek wound.  The patient fell and sustained a large defect to the left cheek. She had a cheek advancement and there was a small area remaining.  It is now ready for grafting.   PROCEDURE DETAILS:  The patient was seen prior to surgery and marked.  The IV antibiotics were given. The patient was taken to the operating room and given a general anesthetic. A standard time out was performed and all information was confirmed by those in the room. SCDs were placed.   The left cheek was prepped and draped.  The anterior left cheek area was marked for a 1 x 1.5 cm area of an ellipse. The #15 blade was used to excise the ellipse.  The site was closed with the 5-0 Monocryl.  Derma bond and steri strips were applied.  The graft was placed on the left cheek and secured with the 5-0 Vicryl.  A xeroform bolster was applied and tied in place. The patient was allowed to wake up and taken to recovery room in stable condition at the end of the case. The family was notified at the end of the case.   The advanced practice practitioner (APP) assisted throughout the case.  The APP was essential in retraction and counter traction when needed to make the case progress smoothly.  This retraction and assistance made it possible to see the tissue plans for the procedure.  The assistance was needed for blood control, tissue re-approximation and assisted with closure of the incision site.

## 2022-04-06 NOTE — Interval H&P Note (Signed)
History and Physical Interval Note:  04/06/2022 12:07 PM  [name]  has presented today for surgery, with the diagnosis of Facial Trauma.  The various methods of treatment have been discussed with the patient and family. After consideration of risks, benefits and other options for treatment, the patient has consented to  Procedure(s): SKIN GRAFT FULL THICKNESS (Left) as a surgical intervention.  The patient's history has been reviewed, patient examined, no change in status, stable for surgery.  I have reviewed the patient's chart and labs.  Questions were answered to the patient's satisfaction.     Loel Lofty Marelly Wehrman

## 2022-04-06 NOTE — Discharge Instructions (Addendum)
Keep graft site dry.  Do not get graft (yellow) wet. Head elevated as able._   Post Anesthesia Home Care Instructions  Activity: Get plenty of rest for the remainder of the day. A responsible individual must stay with you for 24 hours following the procedure.  For the next 24 hours, DO NOT: -Drive a car -Paediatric nurse -Drink alcoholic beverages -Take any medication unless instructed by your physician -Make any legal decisions or sign important papers.  Meals: Start with liquid foods such as gelatin or soup. Progress to regular foods as tolerated. Avoid greasy, spicy, heavy foods. If nausea and/or vomiting occur, drink only clear liquids until the nausea and/or vomiting subsides. Call your physician if vomiting continues.  Special Instructions/Symptoms: Your throat may feel dry or sore from the anesthesia or the breathing tube placed in your throat during surgery. If this causes discomfort, gargle with warm salt water. The discomfort should disappear within 24 hours.  If you had a scopolamine patch placed behind your ear for the management of post- operative nausea and/or vomiting:  1. The medication in the patch is effective for 72 hours, after which it should be removed.  Wrap patch in a tissue and discard in the trash. Wash hands thoroughly with soap and water. 2. You may remove the patch earlier than 72 hours if you experience unpleasant side effects which may include dry mouth, dizziness or visual disturbances. 3. Avoid touching the patch. Wash your hands with soap and water after contact with the patch.

## 2022-04-07 ENCOUNTER — Encounter (HOSPITAL_BASED_OUTPATIENT_CLINIC_OR_DEPARTMENT_OTHER): Payer: Self-pay | Admitting: Plastic Surgery

## 2022-04-10 ENCOUNTER — Ambulatory Visit: Payer: Medicare Other | Admitting: Student

## 2022-04-14 ENCOUNTER — Telehealth: Payer: Self-pay

## 2022-04-14 NOTE — Patient Outreach (Signed)
  Care Management   Outreach Note  04/14/2022 Name: [name] MRN: [id number] DOB: Oct 21, 1950  An unsuccessful telephone outreach was attempted today. The patient was referred to the case management team for assistance with care management and care coordination.   Follow Up Plan:  A HIPAA compliant voice message was left today requesting a return call.  [employer] Management [phone]

## 2022-04-15 DIAGNOSIS — R350 Frequency of micturition: Secondary | ICD-10-CM | POA: Diagnosis not present

## 2022-04-15 DIAGNOSIS — S76012A Strain of muscle, fascia and tendon of left hip, initial encounter: Secondary | ICD-10-CM | POA: Diagnosis not present

## 2022-04-15 DIAGNOSIS — F329 Major depressive disorder, single episode, unspecified: Secondary | ICD-10-CM | POA: Diagnosis not present

## 2022-04-15 DIAGNOSIS — M25552 Pain in left hip: Secondary | ICD-10-CM | POA: Diagnosis not present

## 2022-04-16 ENCOUNTER — Telehealth: Payer: Self-pay | Admitting: Radiology

## 2022-04-16 ENCOUNTER — Encounter: Payer: Self-pay | Admitting: Orthopedic Surgery

## 2022-04-16 ENCOUNTER — Ambulatory Visit (INDEPENDENT_AMBULATORY_CARE_PROVIDER_SITE_OTHER): Payer: Medicare Other | Admitting: Orthopedic Surgery

## 2022-04-16 VITALS — BP 158/90 | HR 91 | Ht 61.0 in | Wt 138.2 lb

## 2022-04-16 DIAGNOSIS — M1612 Unilateral primary osteoarthritis, left hip: Secondary | ICD-10-CM | POA: Diagnosis not present

## 2022-04-16 DIAGNOSIS — M7062 Trochanteric bursitis, left hip: Secondary | ICD-10-CM

## 2022-04-16 DIAGNOSIS — I255 Ischemic cardiomyopathy: Secondary | ICD-10-CM | POA: Diagnosis not present

## 2022-04-16 MED ORDER — MELOXICAM 7.5 MG PO TABS: 7.5000 mg | ORAL_TABLET | Freq: Every day | ORAL | 5 refills | Status: DC

## 2022-04-16 NOTE — Addendum Note (Signed)
Addended by: Elizabeth Sauer on: 04/16/2022 03:12 PM   Modules accepted: Orders

## 2022-04-16 NOTE — Patient Instructions (Signed)
Use the cane right hand   Set up IA injection left hip

## 2022-04-16 NOTE — Telephone Encounter (Signed)
Injection ordered, gave pt # to call and schedule.

## 2022-04-16 NOTE — Progress Notes (Signed)
Chief Complaint  Patient presents with   Hip Pain    NEW PROBLEM LEFT HIP/NKI PAINFUL X 2 MTHS   [name] is a 71 year old female status post lumbar fusion presents with 57-monthhistory of left hip pain located over the greater trochanter and into the left groin  Patient denies any back or radicular pain  She has already had some x-rays done  She presents for evaluation and management thinking she may need an injection   Past Medical History:  Diagnosis Date   Anxiety    Cardiomyopathy    Cervical high risk HPV (human papillomavirus) test positive 01/30/2015   Chronic alcohol abuse 06/09/2020   Chronic constipation    Chronic lower back pain    Constipation 01/30/2015   Depression    GERD (gastroesophageal reflux disease)    Heart failure    Chronic combined   HTN (hypertension)    Hypercholesteremia    Neurogenic bladder    2008 nerve damage s/p back ssurgery   Presence of permanent cardiac pacemaker    [place] II diabetes [place]    Past Surgical History:  Procedure Laterality Date   ACHILLES TENDON SURGERY Right 06/2012   BACK SURGERY  2005   BI-VENTRICULAR IMPLANTABLE CARDIOVERTER DEFIBRILLATOR  (CRT-D)  12-18-2013   upgrade of previously implanted dual chamber pacemaker to MDT CRTD with RV lead revision by Dr TPlacido SouPACEMAKER UPGRADE  12/18/2013   w/defibrillator   BIV PACEMAKER GENERATOR CHANGE OUT N/A 12/18/2013   Procedure: BIV PACEMAKER GENERATOR CHANGE OUT;  Surgeon: GEvans Lance MD;  [facility];  Service: Cardiovascular;  Laterality: N/A;   BIV PACEMAKER INSERTION CRT-P N/A 02/22/2020   Procedure: DOWNGRADE BIV PACEMAKER INSERTION CRT-P;  Surgeon: TEvans Lance MD;  [place];  Service: Cardiovascular;  Laterality: N/A;   BREAST BIOPSY Left 1973   benign   BREAST LUMPECTOMY Left [place]  CHOLECYSTECTOMY     GASTROCNEMIUS RECESSION  06/24/2012   Procedure: GASTROCNEMIUS SLIDE;   Surgeon: PColin Rhein MD;  [facility];  Service: Orthopedics;  Laterality: Right;   HERNIA REPAIR     LACERATION REPAIR Left 02/17/2022   Procedure: REPAIR LEFT CHEEK COMPLEX LACERATION WITH MYRIAD PLACEMENT;  Surgeon: DWallace Going DO;  [place]  Service: Plastics;  Laterality: Left;   LEAD REVISION N/A 12/18/2013   Procedure: LEAD REVISION;  Surgeon: GEvans Lance MD;  [facility];  Service: Cardiovascular;  Laterality: N/A;   PACEMAKER INSERTION  2006   Dual chamber MDT pacemaker implanted - subsequent upgrade to CRTD 12-2013   POSTERIOR LUMBAR FUSION  2006; 2008   SIGMOIDOSCOPY  MAY 2011 w/ PROP   iTCS-->FSIG-poor bowel prep   SKIN FULL THICKNESS GRAFT Left 04/06/2022   Procedure: SKIN GRAFT FULL THICKNESS;  Surgeon: DWallace Going DO;  [place]  Service: Plastics;  Laterality: Left;   TUBAL LIGATION  ~ 1984   UPPER GASTROINTESTINAL ENDOSCOPY  MAY 2011 w/ PROP   MILD GASTRITIS 2o ETOH   BP (!) 158/90   Pulse 91   Ht _0  (1.549 m)   Wt 138 lb 3.2 oz (62.7 kg)   BMI 26.11 kg/m   She is a small framed well-developed well-nourished female she has a recent facial surgery after a fall requiring some plastic surgery.  She says her hip was hurting before that and that is not related  Again she denies any back pain  Her back is nontender  She has pain with range of motion of her hip although the range of motion is not restricted.  She is tender over the left greater trochanter  There is no leg length discrepancy no muscle atrophy.  Normal sensation pulse perfusion left lower extremity  Outside image  My interpretation of the images that she has mild arthritis of the left hip there is some either heterotopic bone outside the acetabulum or bone spur  There is definite asymmetry between the left hip and the right hip but I would not say this arthritis is severe  My diagnosis is that she has  Encounter Diagnoses   Name Primary?   Arthritis of left hip Yes   Trochanteric bursitis, left hip     She wanted to have the injection of the left hip bursitis I told her would not help the groin pain from the arthritis and recommended she get an intra-articular injection for that she was agreeable  I agreed to inject the left hip for bursitis Procedure note injection for left hip bursitis  Verbal consent was obtained for injection of the  left hip   Timeout was completed to confirm the injection site  The medications used were 40 mg of Depo-Medrol and 1% lidocaine 3 cc  Anesthesia was provided by ethyl chloride and the skin was prepped with alcohol.  After cleaning the skin with alcohol a 25-gauge needle was used to inject the left hip greater trochanteric bursa

## 2022-04-16 NOTE — Telephone Encounter (Signed)
Patient was asking about another injection when she left. Dr Aline Brochure said you are setting this up. Will you let patient know?  Or if you want me to, send me message  back and I can call her.

## 2022-04-16 NOTE — Telephone Encounter (Signed)
Patient has asked when leaving if we will set her up for another injection in her groin. She had a trochanteric bursa injection today, do you want her to have something else?

## 2022-04-17 ENCOUNTER — Ambulatory Visit (INDEPENDENT_AMBULATORY_CARE_PROVIDER_SITE_OTHER): Payer: Medicare Other | Admitting: Student

## 2022-04-17 DIAGNOSIS — S0993XA Unspecified injury of face, initial encounter: Secondary | ICD-10-CM

## 2022-04-17 NOTE — Progress Notes (Signed)
Patient is a 71 year old female who presented to [facility] ER on 02/17/2022 after falling and hitting her face on a piece of furniture.  Patient was found to have a resulting wound to the left cheek with some missing skin and soft tissue.  Patient was taken to the operating room on 02/17/2022 with Dr. Marla Roe for complex repair of the left cheek laceration, cheek tissue advancement, lower lid laceration repair and nose laceration repair.    Patient most recently underwent full-thickness skin graft to the left cheek measuring 1 x 1.5 cm with Dr. Marla Roe on 04/06/2022.  Patient presents today for postoperative follow-up.  Today, patient states she is doing all right.  She reports some minor swelling underneath her eye, otherwise denies any issues with the surgical site.  She states she has been keeping the bolster dry and clean.  Patient reports she has been putting [place] on her scars to the inferior and lateral parts of the wounds that have been already healing.    Patient also reports that she has decreased feeling to her left cheek and near her scars.  Patient states she is depressed based off of how her face looks currently.  Patient states she has seen a psychiatrist in the past, and that it has helped her at that time.  On exam, patient is sitting upright.  She is tearful.  The bolster is in place and secured with sutures.  The sutures were cut and the bolster was removed.  The skin graft appears viable and appears to be taking well to the area.  There is some surrounding swelling to the skin graft and underneath her eye.  There is a small 0.2 cm wound just superior of the skin graft.  There does not appear to be any signs of infection.  The incision to her preauricular area is intact.  There is no surrounding erythema or swelling.  I discussed with the patient that she may put a small amount of Vaseline on the very small wound she has and near the graft.  I discussed with the patient that she can  start showering and keep the area clean dry and intact.  I discussed with the patient that it may take a long time for some of her sensation to come back to the left side of her face, I discussed also that it may remain with decreased sensation.  I recommended the patient follow-up with a psychiatrist or therapist regarding her depression.  Patient acknowledged and was open to this recommendation.  Instructed patient to call back if she has any questions or concerns.  Patient to follow-up in 2 weeks.  Pictures were obtained of the patient and placed in the patient's chart with the patient's permission.  Dr. Marla Roe also examined the patient.

## 2022-04-20 DIAGNOSIS — N189 Chronic kidney disease, unspecified: Secondary | ICD-10-CM | POA: Diagnosis not present

## 2022-04-20 DIAGNOSIS — E1122 Type 2 diabetes mellitus with diabetic chronic kidney disease: Secondary | ICD-10-CM | POA: Diagnosis not present

## 2022-04-20 DIAGNOSIS — I5042 Chronic combined systolic (congestive) and diastolic (congestive) heart failure: Secondary | ICD-10-CM | POA: Diagnosis not present

## 2022-04-20 DIAGNOSIS — R809 Proteinuria, unspecified: Secondary | ICD-10-CM | POA: Diagnosis not present

## 2022-04-20 DIAGNOSIS — N19 Unspecified kidney failure: Secondary | ICD-10-CM | POA: Diagnosis not present

## 2022-04-20 DIAGNOSIS — I129 Hypertensive chronic kidney disease with stage 1 through stage 4 chronic kidney disease, or unspecified chronic kidney disease: Secondary | ICD-10-CM | POA: Diagnosis not present

## 2022-04-20 DIAGNOSIS — E1129 Type 2 diabetes mellitus with other diabetic kidney complication: Secondary | ICD-10-CM | POA: Diagnosis not present

## 2022-04-22 ENCOUNTER — Ambulatory Visit (HOSPITAL_COMMUNITY)
Admission: RE | Admit: 2022-04-22 | Discharge: 2022-04-22 | Disposition: A | Discharge status: Home / Self Care | Payer: Medicare Other | Source: Ambulatory Visit | Attending: Orthopedic Surgery | Admitting: Orthopedic Surgery

## 2022-04-22 ENCOUNTER — Encounter (HOSPITAL_COMMUNITY): Payer: Self-pay

## 2022-04-22 DIAGNOSIS — M25552 Pain in left hip: Secondary | ICD-10-CM | POA: Insufficient documentation

## 2022-04-22 DIAGNOSIS — M1612 Unilateral primary osteoarthritis, left hip: Secondary | ICD-10-CM | POA: Diagnosis not present

## 2022-04-22 MED ORDER — BUPIVACAINE HCL (PF) 0.5 % IJ SOLN
INTRAMUSCULAR | Status: AC
  Administered 2022-04-22: 5 mg via INTRA_ARTICULAR
  Filled 2022-04-22: qty 30

## 2022-04-22 MED ORDER — LIDOCAINE HCL (PF) 1 % IJ SOLN
INTRAMUSCULAR | Status: AC
  Administered 2022-04-22: 5 mL via INTRA_ARTICULAR
  Filled 2022-04-22: qty 5

## 2022-04-22 MED ORDER — IOHEXOL 180 MG/ML  SOLN
INTRAMUSCULAR | Status: AC
  Administered 2022-04-22: 4 mL via INTRA_ARTICULAR
  Filled 2022-04-22: qty 10

## 2022-04-22 MED ORDER — METHYLPREDNISOLONE ACETATE 40 MG/ML IJ SUSP
INTRAMUSCULAR | Status: AC
  Administered 2022-04-22: 40 mg via INTRA_ARTICULAR
  Filled 2022-04-22: qty 1

## 2022-04-22 MED ORDER — POVIDONE-IODINE 10 % EX SOLN
CUTANEOUS | Status: AC
  Administered 2022-04-22: 1
  Filled 2022-04-22: qty 14.8

## 2022-04-28 ENCOUNTER — Ambulatory Visit: Payer: Self-pay

## 2022-04-28 NOTE — Patient Outreach (Signed)
  Care Coordination   04/28/2022 Name: [name] MRN: [id number] DOB: 07-Dec-1950   Care Coordination Outreach Attempts:  An unsuccessful telephone outreach was attempted today to offer the patient information about available care coordination services as a benefit of their health plan.   Follow Up Plan:  Additional outreach attempts will be made to offer the patient care coordination information and services.   Encounter Outcome:  No Answer  Care Coordination Interventions Activated:  No   Care Coordination Interventions:  No, not indicated     [facility] [phone]

## 2022-04-29 DIAGNOSIS — E1122 Type 2 diabetes mellitus with diabetic chronic kidney disease: Secondary | ICD-10-CM | POA: Diagnosis not present

## 2022-04-29 DIAGNOSIS — E1129 Type 2 diabetes mellitus with other diabetic kidney complication: Secondary | ICD-10-CM | POA: Diagnosis not present

## 2022-04-29 DIAGNOSIS — N19 Unspecified kidney failure: Secondary | ICD-10-CM | POA: Diagnosis not present

## 2022-04-29 DIAGNOSIS — N170 Acute kidney failure with tubular necrosis: Secondary | ICD-10-CM | POA: Diagnosis not present

## 2022-04-29 DIAGNOSIS — N189 Chronic kidney disease, unspecified: Secondary | ICD-10-CM | POA: Diagnosis not present

## 2022-04-29 DIAGNOSIS — R809 Proteinuria, unspecified: Secondary | ICD-10-CM | POA: Diagnosis not present

## 2022-04-29 DIAGNOSIS — I5042 Chronic combined systolic (congestive) and diastolic (congestive) heart failure: Secondary | ICD-10-CM | POA: Diagnosis not present

## 2022-04-29 DIAGNOSIS — I129 Hypertensive chronic kidney disease with stage 1 through stage 4 chronic kidney disease, or unspecified chronic kidney disease: Secondary | ICD-10-CM | POA: Diagnosis not present

## 2022-04-30 ENCOUNTER — Ambulatory Visit (INDEPENDENT_AMBULATORY_CARE_PROVIDER_SITE_OTHER): Payer: Medicare Other | Admitting: Surgical

## 2022-04-30 DIAGNOSIS — S0993XA Unspecified injury of face, initial encounter: Secondary | ICD-10-CM

## 2022-04-30 DIAGNOSIS — W01190A Fall on same level from slipping, tripping and stumbling with subsequent striking against furniture, initial encounter: Secondary | ICD-10-CM

## 2022-04-30 DIAGNOSIS — S01412A Laceration without foreign body of left cheek and temporomandibular area, initial encounter: Secondary | ICD-10-CM

## 2022-04-30 NOTE — Progress Notes (Signed)
Patient is a 71 year old female here for follow-up on her full-thickness skin graft to her left cheek.  She reports overall she is doing well, has some concerns about the graft appearing thick.  She also has some questions about scarring and downward turn of her left upper lip with smiling.  She reports she feels as if she looks like she has had a stroke.  On exam, full-thickness skin graft has healed well.  It is raised approximately 1.5 mm, good color and capillary refill.  There is no skin necrosis noted.  The cheek incisions are well-healed, have some peri-incisional erythema and some minor tracking scars from the sutures.  The preauricular full-thickness skin graft donor site incision is well-healed.  I do not appreciate any erythema or swelling of her left cheek.  With animation of her face she does have some downward turning of the left upper lip.  Recommend continuing with massage to the incisions with scar cream, discussed strict use of facial cream with sunscreen to prevent discoloration of the scars when exposed to the sun.  We discussed that the full-thickness skin graft over the next few months may thin as swelling improves.  All of her questions were answered to her content.  I do not see any signs of infection on exam.  We will have her follow-up in 3 months with Dr. Marla Roe to discuss if any additional surgical interventions will be necessary.  Pictures were taken and placed in the patient's chart with patient's permission.

## 2022-05-01 ENCOUNTER — Ambulatory Visit: Payer: Medicare Other | Admitting: Student

## 2022-05-04 DIAGNOSIS — N1831 Chronic kidney disease, stage 3a: Secondary | ICD-10-CM | POA: Diagnosis not present

## 2022-05-04 DIAGNOSIS — E162 Hypoglycemia, unspecified: Secondary | ICD-10-CM | POA: Diagnosis not present

## 2022-05-04 DIAGNOSIS — E139 Other specified diabetes mellitus without complications: Secondary | ICD-10-CM | POA: Diagnosis not present

## 2022-05-04 DIAGNOSIS — E1140 Type 2 diabetes mellitus with diabetic neuropathy, unspecified: Secondary | ICD-10-CM | POA: Diagnosis not present

## 2022-05-04 DIAGNOSIS — E109 Type 1 diabetes mellitus without complications: Secondary | ICD-10-CM | POA: Diagnosis not present

## 2022-05-04 DIAGNOSIS — E118 Type 2 diabetes mellitus with unspecified complications: Secondary | ICD-10-CM | POA: Diagnosis not present

## 2022-05-04 DIAGNOSIS — E785 Hyperlipidemia, unspecified: Secondary | ICD-10-CM | POA: Diagnosis not present

## 2022-05-04 DIAGNOSIS — I10 Essential (primary) hypertension: Secondary | ICD-10-CM | POA: Diagnosis not present

## 2022-05-05 ENCOUNTER — Other Ambulatory Visit: Payer: Self-pay | Admitting: Internal Medicine

## 2022-05-05 ENCOUNTER — Encounter: Payer: Self-pay | Admitting: Emergency Medicine

## 2022-05-05 ENCOUNTER — Other Ambulatory Visit (HOSPITAL_COMMUNITY): Payer: Self-pay | Admitting: Internal Medicine

## 2022-05-05 ENCOUNTER — Ambulatory Visit
Admission: EM | Admit: 2022-05-05 | Discharge: 2022-05-05 | Disposition: A | Discharge status: Home / Self Care | Payer: Medicare Other | Attending: Family Medicine | Admitting: Family Medicine

## 2022-05-05 ENCOUNTER — Emergency Department (HOSPITAL_COMMUNITY)
Admission: EM | Admit: 2022-05-05 | Discharge: 2022-05-05 | Discharge status: Against Medical Advice | Payer: Medicare Other | Source: Home / Self Care

## 2022-05-05 ENCOUNTER — Other Ambulatory Visit: Payer: Self-pay

## 2022-05-05 ENCOUNTER — Ambulatory Visit: Payer: Self-pay

## 2022-05-05 DIAGNOSIS — R109 Unspecified abdominal pain: Secondary | ICD-10-CM | POA: Diagnosis not present

## 2022-05-05 DIAGNOSIS — M545 Low back pain, unspecified: Secondary | ICD-10-CM | POA: Diagnosis not present

## 2022-05-05 LAB — POCT URINALYSIS DIP (MANUAL ENTRY)
Bilirubin, UA: NEGATIVE
Glucose, UA: NEGATIVE mg/dL
Ketones, POC UA: NEGATIVE mg/dL
Nitrite, UA: NEGATIVE
Protein Ur, POC: 100 mg/dL — AB
Spec Grav, UA: 1.015 (ref 1.010–1.025)
Urobilinogen, UA: 0.2 E.U./dL
pH, UA: 7 (ref 5.0–8.0)

## 2022-05-05 MED ORDER — TIZANIDINE HCL 4 MG PO TABS: ORAL_TABLET | ORAL | 0 refills | Status: DC

## 2022-05-05 MED ORDER — DEXAMETHASONE SODIUM PHOSPHATE 10 MG/ML IJ SOLN
10.0000 mg | Freq: Once | INTRAMUSCULAR | Status: AC
Start: 2022-05-05 — End: 2022-05-05
  Administered 2022-05-05: 10 mg via INTRAMUSCULAR

## 2022-05-05 NOTE — Patient Instructions (Signed)
Visit Information  Thank you for taking time to visit with me today. Please don't hesitate to contact me if I can be of assistance to you.   Following are the goals we discussed today:         Our next appointment is by telephone on June 07, 2022 at 11:30. Please call the care guide team at [phone] if you need to cancel or reschedule your appointment.     The patient verbalized understanding of instructions, educational materials, and care plan provided today and DECLINED offer to receive copy of patient instructions, educational materials, and care plan.   A member of the care management team will follow up this week.  [facility] [phone]

## 2022-05-05 NOTE — ED Triage Notes (Signed)
Pt reports lower back pain since driving yesterday. Pt denies any known injury.

## 2022-05-05 NOTE — Patient Outreach (Signed)
  Care Coordination   Initial Visit Note   05/05/2022 Name: [name] MRN: [id number] DOB: 05-25-1951  [name] is a 71 y.o. year old female who sees [place] Crane, Edwinna Areola, MD for primary care. I spoke with  Alvie Heidelberg by phone today  What matters to the patients health and wellness today?  Back Pain      SDOH assessments and interventions completed:  Yes  SDOH Interventions Today    Flowsheet Row Most Recent Value  SDOH Interventions   Food Insecurity Interventions Intervention Not Indicated  Transportation Interventions Intervention Not Indicated        Care Coordination Interventions Activated:  Yes  Care Coordination Interventions:  Yes, provided   Follow up plan: Follow up call scheduled for May 07, 2022.    Encounter Outcome:  Pt. Visit Completed   [facility] [phone]

## 2022-05-06 ENCOUNTER — Ambulatory Visit (HOSPITAL_COMMUNITY)
Admission: RE | Admit: 2022-05-06 | Discharge: 2022-05-06 | Disposition: A | Discharge status: Home / Self Care | Payer: Medicare Other | Source: Ambulatory Visit | Attending: Internal Medicine | Admitting: Internal Medicine

## 2022-05-06 DIAGNOSIS — R109 Unspecified abdominal pain: Secondary | ICD-10-CM | POA: Insufficient documentation

## 2022-05-06 DIAGNOSIS — I700 Atherosclerosis of aorta: Secondary | ICD-10-CM | POA: Diagnosis not present

## 2022-05-07 ENCOUNTER — Ambulatory Visit: Payer: Self-pay

## 2022-05-07 NOTE — Patient Outreach (Signed)
  Care Coordination   Follow Up Visit Note   05/07/2022 Name: TALOR DESROSIERS MRN: [id number] DOB: August 20, 1951  SAYRA FRISBY is a 71 y.o. year old female who sees [place] Crane, Edwinna Areola, MD for primary care. I spoke with  Alvie Heidelberg by phone today  What matters to the patients health and wellness today?  Treatment for Back Pain       SDOH assessments and interventions completed:  No     Care Coordination Interventions Activated:  Yes  Care Coordination Interventions:  Yes, provided   Follow up plan:  Will follow up within the next week.    Encounter Outcome:  Pt. Visit Completed    [facility] [phone]

## 2022-05-09 NOTE — ED Provider Notes (Addendum)
[facility]    CSN: [id number] Arrival date & time: 05/05/22  0825      History   Chief Complaint Chief Complaint  Patient presents with   Back Pain    HPI [name] is a 71 y.o. female.   Patient presenting today with sharp stabbing constant left low back pain after driving yesterday. Denies known injury directly to area, radiation of pain, fever, chills, bowel or bladder incontinence, urinary sxs. Hx of chronic lumbar arthropathy on tizanidine, meloxicam, tylenol. States gabapentin never helps with her back issues.     Past Medical History:  Diagnosis Date   Anxiety    Cardiomyopathy    Cervical high risk HPV (human papillomavirus) test positive 01/30/2015   Chronic alcohol abuse 06/09/2020   Chronic constipation    Chronic lower back pain    Constipation 01/30/2015   Depression    GERD (gastroesophageal reflux disease)    Heart failure    Chronic combined   HTN (hypertension)    Hypercholesteremia    Neurogenic bladder    2008 nerve damage s/p back ssurgery   Presence of permanent cardiac pacemaker    Type II diabetes mellitus [facility])     Patient Active Problem List   Diagnosis Date Noted   Visual hallucination 02/25/2022   Delirium 91/63/8466   Metabolic acidosis 59/93/5701   Hypophosphatemia 02/22/2022   Hypomagnesemia 02/22/2022   Acute blood loss anemia superimposed on anemia of chronic disease 02/20/2022   Acute toxic and metabolic encephalopathy 77/93/9030   Thrombocytopenia ([place]) 02/20/2022   Hypokalemia 02/20/2022   Rhabdomyolysis 02/20/2022   Lactic acidosis 02/20/2022   Urinary tract infection 02/20/2022   Clostridioides difficile infection 02/18/2022   Facial trauma due to fall at home 02/17/2022   AKI (acute kidney injury) ([place]) 02/17/2022   Hypovolemic shock ([place])    Cardiomyopathy ([place]) 11/14/2020   Diabetic nephropathy ([place]) 11/14/2020   Impaired mobility and activities of daily living 06/10/2020   Alcohol withdrawal  syndrome ([place]) 06/09/2020   Facet arthropathy, lumbar 06/09/2020   Sepsis ([place]) 06/09/2020   Sinus tachycardia by electrocardiogram 06/09/2020   Spondylolisthesis of lumbosacral region 06/09/2020   Altered mental status 03/12/2020   Stage 3b chronic kidney disease ([place]) 10/24/2019   Urine leukocytes 09/24/2019   Other microscopic hematuria 09/24/2019   Postmenopause 02/08/2018   Screening for colorectal cancer 02/08/2018   Well woman exam with routine gynecological exam 02/08/2018   Anemia in chronic kidney disease 12/15/2016   Uncontrolled hypertension 09/12/2015   Cervical high risk HPV (human papillomavirus) test positive 01/30/2015   Biventricular ICD (implantable cardioverter-defibrillator) in place 06/06/3006   Chronic systolic heart failure ([place]) 12/18/2013   Preop cardiovascular exam 11/17/2011   Colon cancer screening 02/04/2011   Uncontrolled IDDM-2 with hyperglycemia and CKD-3B 12/31/2009   Mixed hyperlipidemia 12/31/2009   ANXIETY DEPRESSION 12/31/2009   GASTROESOPHAGEAL REFLUX DISEASE, CHRONIC 12/31/2009   CONSTIPATION, CHRONIC 12/31/2009   Chronic lower back pain 12/31/2009   Cerebral vascular accident ([place]) 05/22/2009   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   HYPERTENSION, HEART CONTROLLED W/O ASSOC CHF 11/27/2008   Chronic combined CHF s/p BIV PPM now with recovered EF 11/27/2008   PACEMAKER, PERMANENT 11/27/2008    Past Surgical History:  Procedure Laterality Date   ACHILLES TENDON SURGERY Right 06/2012   BACK SURGERY  2005   BI-VENTRICULAR IMPLANTABLE CARDIOVERTER DEFIBRILLATOR  (CRT-D)  12-18-2013   upgrade of previously implanted dual chamber pacemaker to MDT CRTD with RV lead revision by  Dr Lovena Le   BI-VENTRICULAR PACEMAKER UPGRADE  12/18/2013   w/defibrillator   BIV PACEMAKER GENERATOR CHANGE OUT N/A 12/18/2013   Procedure: BIV PACEMAKER GENERATOR CHANGE OUT;  Surgeon: Evans Lance, MD;  Location: [facility] CATH LAB;  Service: Cardiovascular;   Laterality: N/A;   BIV PACEMAKER INSERTION CRT-P N/A 02/22/2020   Procedure: DOWNGRADE BIV PACEMAKER INSERTION CRT-P;  Surgeon: Evans Lance, MD;  Location: [facility] CV LAB;  Service: Cardiovascular;  Laterality: N/A;   BREAST BIOPSY Left 1973   benign   BREAST LUMPECTOMY Left [place]   CHOLECYSTECTOMY     GASTROCNEMIUS RECESSION  06/24/2012   Procedure: GASTROCNEMIUS SLIDE;  Surgeon: Colin Rhein, MD;  Location: [facility]S;  Service: Orthopedics;  Laterality: Right;   HERNIA REPAIR     LACERATION REPAIR Left 02/17/2022   Procedure: REPAIR LEFT CHEEK COMPLEX LACERATION WITH MYRIAD PLACEMENT;  Surgeon: Wallace Going, DO;  Location: [facility];  Service: Plastics;  Laterality: Left;   LEAD REVISION N/A 12/18/2013   Procedure: LEAD REVISION;  Surgeon: Evans Lance, MD;  Location: [facility] CATH LAB;  Service: Cardiovascular;  Laterality: N/A;   PACEMAKER INSERTION  2006   Dual chamber MDT pacemaker implanted - subsequent upgrade to CRTD 12-2013   POSTERIOR LUMBAR FUSION  2006; 2008   SIGMOIDOSCOPY  MAY 2011 w/ PROP   iTCS-->FSIG-poor bowel prep   SKIN FULL THICKNESS GRAFT Left 04/06/2022   Procedure: SKIN GRAFT FULL THICKNESS;  Surgeon: Wallace Going, DO;  Location: [place];  Service: Plastics;  Laterality: Left;   TUBAL LIGATION  ~ 1984   UPPER GASTROINTESTINAL ENDOSCOPY  MAY 2011 w/ PROP   MILD GASTRITIS 2o ETOH    OB History     Gravida  1   Para  1   Term      Preterm      AB      Living         SAB      IAB      Ectopic      Multiple      Live Births  1            Home Medications    Prior to Admission medications   Medication Sig Start Date End Date Taking? Authorizing Provider  Artificial Tear Solution (SYSTANE CONTACTS OP) Place 1 drop into both eyes 2 (two) times daily.    [provider]  aspirin EC 81 MG tablet Take 81 mg by mouth daily.    [provider]  buPROPion (WELLBUTRIN XL)  300 MG 24 hr tablet Take 300 mg by mouth daily. 06/19/20   [provider]  BYDUREON BCISE 2 MG/0.85ML AUIJ Inject 2 mg into the skin once a week. 02/02/22   [provider]  carvedilol (COREG) 12.5 MG tablet Take 12.5 mg by mouth 2 (two) times daily. 02/02/22   [provider]  clonazePAM (KLONOPIN) 0.25 MG disintegrating tablet Take 1 tablet (0.25 mg total) by mouth 2 (two) times daily as needed for up to 3 days (anxiety). 03/04/22 04/06/22  Hosie Poisson, MD  furosemide (LASIX) 40 MG tablet Take 1 tablet (40 mg total) by mouth daily. Patient taking differently: Take 40 mg by mouth every other day. 06/06/19   Evans Lance, MD  insulin aspart (NOVOLOG) 100 UNIT/ML injection CBG 70 - 120: 0 units  CBG 121 - 150: 1 unit  CBG 151 - 200: 2 units  CBG 201 - 250: 3 units  CBG 251 - 300: 5 units  CBG 301 - 350: 7 units  CBG 351 - 400: 9 units 03/04/22   Hosie Poisson, MD  losartan (COZAAR) 25 MG tablet Take 12.5 mg by mouth daily. 06/19/20   [provider]  meloxicam (MOBIC) 7.5 MG tablet Take 1 tablet (7.5 mg total) by mouth daily. 04/16/22   Carole Civil, MD  Multiple Vitamin (MULTIVITAMIN WITH MINERALS) TABS tablet Take 1 tablet by mouth daily. 03/05/22   Hosie Poisson, MD  omeprazole (PRILOSEC) 20 MG capsule Take 20 mg by mouth daily.    [provider]  ondansetron (ZOFRAN) 4 MG tablet Take 1 tablet (4 mg total) by mouth every 8 (eight) hours as needed for up to 20 doses for nausea or vomiting. 03/30/22   Clance Boll, PA-C  oxyCODONE (ROXICODONE) 5 MG immediate release tablet Take 1 tablet (5 mg total) by mouth every 8 (eight) hours as needed for up to 15 doses for severe pain. 04/03/22   Clance Boll, PA-C  potassium chloride SA (K-DUR,KLOR-CON) 20 MEQ tablet Take 20 mEq by mouth 2 (two) times daily.    [provider]  rosuvastatin (CRESTOR) 40 MG tablet TAKE ONE (1) TABLET BY MOUTH EVERY DAY (STOP ATORVASTATIN) 01/29/20   Corum,  Rex Kras, MD  sertraline (ZOLOFT) 100 MG tablet Take 1 tablet (100 mg total) by mouth daily. 03/05/22   Hosie Poisson, MD  tiZANidine (ZANAFLEX) 4 MG tablet Take 1 tablet 3 times a day by oral route as needed. Do not drink alcohol or drive while taking this medication. May cause drowsiness 05/05/22   Volney American, PA-C  zolpidem (AMBIEN) 10 MG tablet TAKE 1 AND 1/2 TABLETS BY MOUTH AT BEDTIME 12/04/21   [provider]    Family History Family History  Problem Relation Age of Onset   Alzheimer's disease Mother    Diabetes Mother    Cancer Father    Cancer Sister        breast   Diabetes Sister    Diabetes Brother    Brain cancer Brother    Diabetes Brother    Colon cancer Neg Hx    Colon polyps Neg Hx     Social History Social History   Tobacco Use   Smoking status: Never   Smokeless tobacco: Never  Vaping Use   Vaping Use: Never used  Substance Use Topics   Alcohol use: Yes    Alcohol/week: 0.0 standard drinks of alcohol    Comment: drinks beer or wine three times a week   Drug use: No     Allergies   Patient has no known allergies.   Review of Systems Review of Systems PER HPI  Physical Exam Triage Vital Signs ED Triage Vitals  Enc Vitals Group     BP 05/05/22 0905 (!) 165/91     Pulse Rate 05/05/22 0905 96     Resp 05/05/22 0905 20     Temp 05/05/22 0905 98.4 F (36.9 C)     Temp Source 05/05/22 0905 Oral     SpO2 05/05/22 0905 96 %     Weight --      Height --      Head Circumference --      Peak Flow --      Pain Score 05/05/22 0907 10     Pain Loc --      Pain Edu? --  Excl. in GC? --    No data found.  Updated Vital Signs BP (!) 165/91 (BP Location: Right Arm)   Pulse 96   Temp 98.4 F (36.9 C) (Oral)   Resp 20   SpO2 96%   Visual Acuity Right Eye Distance:   Left Eye Distance:   Bilateral Distance:    Right Eye Near:   Left Eye Near:    Bilateral Near:     Physical Exam Vitals and nursing note reviewed.   Constitutional:      Appearance: She is not ill-appearing.  HENT:     Head: Atraumatic.  Eyes:     Extraocular Movements: Extraocular movements intact.     Conjunctiva/sclera: Conjunctivae normal.  Cardiovascular:     Rate and Rhythm: Normal rate and regular rhythm.     Heart sounds: Normal heart sounds.  Pulmonary:     Effort: Pulmonary effort is normal.     Breath sounds: Normal breath sounds.  Musculoskeletal:        General: Tenderness present. Normal range of motion.     Cervical back: Normal range of motion and neck supple.     Comments: No midline spinal ttp diffusely. Left lateral lumbar musculature ttp. Neg SLR b/l LEs  Skin:    General: Skin is warm and dry.  Neurological:     Mental Status: She is alert and oriented to person, place, and time.  Psychiatric:     Comments: Tearful, anxious      UC Treatments / Results  Labs (all labs ordered are listed, but only abnormal results are displayed) Labs Reviewed  POCT URINALYSIS DIP (MANUAL ENTRY) - Abnormal; Notable for the following components:      Result Value   Blood, UA trace-intact (*)    Protein Ur, POC =100 (*)    Leukocytes, UA Small (1+) (*)    All other components within normal limits    EKG   Radiology No results found.  Procedures Procedures (including critical care time)  Medications Ordered in UC Medications  dexamethasone (DECADRON) injection 10 mg (10 mg Intramuscular Given 05/05/22 0946)    Initial Impression / Assessment and Plan / UC Course  I have reviewed the triage vital signs and the nursing notes.  Pertinent labs & imaging results that were available during my care of the patient were reviewed by me and considered in my medical decision making (see chart for details).     U/A shows trace RBCs, protein possibly indicative of kidney stone but exam findings and history possibly more msk in nature. Continue muscle relaxer, mobic, tylenol, and discussed supportive home measures. IM  decadron given in clinic additionally. Return for worsening sxs.  Final Clinical Impressions(s) / UC Diagnoses   Final diagnoses:  Acute left-sided low back pain without sciatica   Discharge Instructions   None    ED Prescriptions     Medication Sig Dispense Auth. Provider   tiZANidine (ZANAFLEX) 4 MG tablet Take 1 tablet 3 times a day by oral route as needed. Do not drink alcohol or drive while taking this medication. May cause drowsiness 15 tablet Volney American, [place]      I have reviewed the PDMP during this encounter.   Volney American, [place] 05/09/22 2116    Volney American, [place] 05/09/22 2118

## 2022-05-11 ENCOUNTER — Encounter: Payer: Self-pay | Admitting: Orthopedic Surgery

## 2022-05-11 ENCOUNTER — Ambulatory Visit (INDEPENDENT_AMBULATORY_CARE_PROVIDER_SITE_OTHER): Payer: Medicare Other | Admitting: Orthopedic Surgery

## 2022-05-11 DIAGNOSIS — M25552 Pain in left hip: Secondary | ICD-10-CM

## 2022-05-11 DIAGNOSIS — I255 Ischemic cardiomyopathy: Secondary | ICD-10-CM | POA: Diagnosis not present

## 2022-05-11 DIAGNOSIS — G8929 Other chronic pain: Secondary | ICD-10-CM | POA: Diagnosis not present

## 2022-05-11 NOTE — Patient Instructions (Signed)
While we are working on your approval forCT please go ahead and call to schedule your appointment with [facility] within at least one (1) week.   Central Scheduling [phone]  

## 2022-05-11 NOTE — Progress Notes (Signed)
Chief Complaint  Patient presents with   Hip Pain    Left no better with injection/ states had a CT scan at [facility] recently     Encounter Diagnosis  Name Primary?   Chronic hip pain, left Yes    71 year old female status post lumbar spinal fusion status post greater trochanteric bursa injection which failed followed by intra-articular injection left hip which also failed to relieve her pain  She says she is in severe pain still despite being on hydrocodone 10 mg  I reviewed her most recent imaging of her CT abdomen and pelvis which was ordered because she was having such severe pain no comments were made about the musculoskeletal system or the hip joint  Unfortunately that was not a test that can give [name] much information about her hip joint  She appears to have had a defibrillator now has a pacemaker says that the defibrillator was removed but I do not think she can have an MRI some cannot order a CT scan of her hip  I will evaluate whether or not she has more arthritis on the CT than we see on the x-ray because the x-ray does not show severe arthritis  Her pain however is in the groin and she has some lateral hip pain so it is possible that she does have significant hip pathology  Also differential diagnosis includes pain above the fusion site  This could be radiating to the hip  Unlikely, since she has such severe groin symptoms  Follow-up after CT hip  I asked her to see her primary care regarding her pain management requirements as I cannot prescribe opioids to a patient already on opioids

## 2022-05-12 ENCOUNTER — Ambulatory Visit (HOSPITAL_COMMUNITY)
Admission: RE | Admit: 2022-05-12 | Discharge: 2022-05-12 | Disposition: A | Discharge status: Home / Self Care | Payer: Medicare Other | Source: Ambulatory Visit | Attending: Orthopedic Surgery | Admitting: Orthopedic Surgery

## 2022-05-12 ENCOUNTER — Encounter (HOSPITAL_COMMUNITY): Payer: Self-pay

## 2022-05-12 DIAGNOSIS — G8929 Other chronic pain: Secondary | ICD-10-CM | POA: Insufficient documentation

## 2022-05-12 DIAGNOSIS — M1612 Unilateral primary osteoarthritis, left hip: Secondary | ICD-10-CM | POA: Insufficient documentation

## 2022-05-12 DIAGNOSIS — M25552 Pain in left hip: Secondary | ICD-10-CM | POA: Diagnosis not present

## 2022-05-12 MED ORDER — LIDOCAINE HCL (PF) 1 % IJ SOLN
INTRAMUSCULAR | Status: AC
  Administered 2022-05-12: 5 mL
  Filled 2022-05-12: qty 5

## 2022-05-12 MED ORDER — SODIUM CHLORIDE (PF) 0.9 % IJ SOLN
INTRAMUSCULAR | Status: AC
  Administered 2022-05-12: 10 mL
  Filled 2022-05-12: qty 10

## 2022-05-12 MED ORDER — POVIDONE-IODINE 10 % EX SOLN
CUTANEOUS | Status: AC
  Administered 2022-05-12: 1
  Filled 2022-05-12: qty 14.8

## 2022-05-12 MED ORDER — IOHEXOL 180 MG/ML  SOLN
INTRAMUSCULAR | Status: AC
  Administered 2022-05-12: 15 mL
  Filled 2022-05-12: qty 20

## 2022-05-12 NOTE — Procedures (Signed)
Preprocedure Dx: Chronic LEFT hip pain Postprocedure Dx: Chronic LEFT hip pain Procedure  Fluoroscopically guided LEFT hip joint injection for CT arthrogrpahy Radiologist:  Thornton Papas Anesthesia:  5 ml of 1% lidocaine Injectate:  10 mL of [42m Omni-180 and 548msetrile saline] Fluoro time:  1 minutes 48 seconds EBL:   None Complications: None

## 2022-05-13 ENCOUNTER — Other Ambulatory Visit (HOSPITAL_COMMUNITY): Payer: Medicare Other

## 2022-05-18 ENCOUNTER — Emergency Department (HOSPITAL_COMMUNITY)
Admission: EM | Admit: 2022-05-18 | Discharge: 2022-05-18 | Disposition: A | Discharge status: Home / Self Care | Payer: Medicare Other | Attending: Emergency Medicine | Admitting: Emergency Medicine

## 2022-05-18 ENCOUNTER — Encounter (HOSPITAL_COMMUNITY): Payer: Self-pay | Admitting: Emergency Medicine

## 2022-05-18 ENCOUNTER — Other Ambulatory Visit: Payer: Self-pay

## 2022-05-18 ENCOUNTER — Emergency Department (HOSPITAL_COMMUNITY): Payer: Medicare Other

## 2022-05-18 DIAGNOSIS — R1032 Left lower quadrant pain: Secondary | ICD-10-CM

## 2022-05-18 DIAGNOSIS — Z794 Long term (current) use of insulin: Secondary | ICD-10-CM | POA: Diagnosis not present

## 2022-05-18 DIAGNOSIS — R1030 Lower abdominal pain, unspecified: Secondary | ICD-10-CM | POA: Insufficient documentation

## 2022-05-18 DIAGNOSIS — R109 Unspecified abdominal pain: Secondary | ICD-10-CM | POA: Diagnosis not present

## 2022-05-18 DIAGNOSIS — Z7982 Long term (current) use of aspirin: Secondary | ICD-10-CM | POA: Insufficient documentation

## 2022-05-18 DIAGNOSIS — N179 Acute kidney failure, unspecified: Secondary | ICD-10-CM | POA: Diagnosis not present

## 2022-05-18 LAB — URINALYSIS, ROUTINE W REFLEX MICROSCOPIC
Bacteria, UA: NONE SEEN
Bilirubin Urine: NEGATIVE
Glucose, UA: NEGATIVE mg/dL
Hgb urine dipstick: NEGATIVE
Ketones, ur: NEGATIVE mg/dL
Nitrite: NEGATIVE
Protein, ur: NEGATIVE mg/dL
Specific Gravity, Urine: 1.009 (ref 1.005–1.030)
pH: 5 (ref 5.0–8.0)

## 2022-05-18 LAB — BASIC METABOLIC PANEL
Anion gap: 8 (ref 5–15)
BUN: 35 mg/dL — ABNORMAL HIGH (ref 8–23)
CO2: 25 mmol/L (ref 22–32)
Calcium: 9.5 mg/dL (ref 8.9–10.3)
Chloride: 108 mmol/L (ref 98–111)
Creatinine, Ser: 2.13 mg/dL — ABNORMAL HIGH (ref 0.44–1.00)
GFR, Estimated: 25 mL/min — ABNORMAL LOW (ref >60)
Glucose, Bld: 101 mg/dL — ABNORMAL HIGH (ref 70–99)
Potassium: 4.9 mmol/L (ref 3.5–5.1)
Sodium: 141 mmol/L (ref 135–145)

## 2022-05-18 LAB — CBC WITH DIFFERENTIAL/PLATELET
Abs Immature Granulocytes: 0.02 10*3/uL (ref 0.00–0.07)
Basophils Absolute: 0 10*3/uL (ref 0.0–0.1)
Basophils Relative: 1 %
Eosinophils Absolute: 0.2 10*3/uL (ref 0.0–0.5)
Eosinophils Relative: 3 %
HCT: 36.8 % (ref 36.0–46.0)
Hemoglobin: 11.6 g/dL — ABNORMAL LOW (ref 12.0–15.0)
Immature Granulocytes: 0 %
Lymphocytes Relative: 29 %
Lymphs Abs: 1.8 10*3/uL (ref 0.7–4.0)
MCH: 30.1 pg (ref 26.0–34.0)
MCHC: 31.5 g/dL (ref 30.0–36.0)
MCV: 95.6 fL (ref 80.0–100.0)
Monocytes Absolute: 0.6 10*3/uL (ref 0.1–1.0)
Monocytes Relative: 9 %
Neutro Abs: 3.7 10*3/uL (ref 1.7–7.7)
Neutrophils Relative %: 58 %
Platelets: 276 10*3/uL (ref 150–400)
RBC: 3.85 MIL/uL — ABNORMAL LOW (ref 3.87–5.11)
RDW: 13.5 % (ref 11.5–15.5)
WBC: 6.3 10*3/uL (ref 4.0–10.5)
nRBC: 0 % (ref 0.0–0.2)

## 2022-05-18 MED ORDER — HYDROMORPHONE HCL 1 MG/ML IJ SOLN
1.0000 mg | Freq: Once | INTRAMUSCULAR | Status: AC
  Administered 2022-05-18: 1 mg via INTRAVENOUS
  Filled 2022-05-18: qty 1

## 2022-05-18 MED ORDER — SODIUM CHLORIDE 0.9 % IV BOLUS
1000.0000 mL | Freq: Once | INTRAVENOUS | Status: AC
Start: 2022-05-18 — End: 2022-05-18
  Administered 2022-05-18: 1000 mL via INTRAVENOUS

## 2022-05-18 NOTE — ED Notes (Signed)
ED Provider at bedside. 

## 2022-05-18 NOTE — Discharge Instructions (Signed)
You were seen in the emergency department for worsening left groin and hip pain.  You had a CAT scan of your abdomen and pelvis that showed some constipation but no signs of a kidney stone.  Your blood work showed your kidney function was worsened and that will be important for you to drink more fluids.  Please follow-up with your orthopedic doctor and general surgery as scheduled.  Return to the emergency department if any worsening or concerning symptoms.

## 2022-05-18 NOTE — ED Triage Notes (Signed)
Pt to the ED with groin pain on the left. Pt states she has a hernia in the left groin.

## 2022-05-18 NOTE — ED Notes (Signed)
Patient given OJ r/t I think my sugar is dropping. Sugar meter reading 80 per patient.

## 2022-05-18 NOTE — ED Provider Notes (Signed)
[facility] EMERGENCY DEPARTMENT Provider Note   CSN: [id number] Arrival date & time: 05/18/22  1608     History  Chief Complaint  Patient presents with   Groin Pain    [name] is a 71 y.o. female.  She is here with complaint of left groin pain that is been going on for a month.  She said she has had a CAT scan of her hip and her groin that showed a hernia.  She is seeing Dr. Aline Brochure from orthopedics and he is done an injection for the hip bursa.  She tried to reach Dr. Arnoldo Morale from general surgery and has an appointment with him at the end of the month.  She said the pain is severe and she cannot wait any longer.  The history is provided by the patient.  Groin Pain This is a new problem. The current episode started more than 1 week ago. The problem occurs constantly. The problem has been gradually worsening. Pertinent negatives include no chest pain, no abdominal pain, no headaches and no shortness of breath. The symptoms are aggravated by bending, twisting and walking. Nothing relieves the symptoms. Treatments tried: hydrocodone. The treatment provided no relief.       Home Medications Prior to Admission medications   Medication Sig Start Date End Date Taking? Authorizing Provider  Artificial Tear Solution (SYSTANE CONTACTS OP) Place 1 drop into both eyes 2 (two) times daily.    [provider]  aspirin EC 81 MG tablet Take 81 mg by mouth daily.    [provider]  buPROPion (WELLBUTRIN XL) 300 MG 24 hr tablet Take 300 mg by mouth daily. 06/19/20   [provider]  BYDUREON BCISE 2 MG/0.85ML AUIJ Inject 2 mg into the skin once a week. 02/02/22   [provider]  carvedilol (COREG) 12.5 MG tablet Take 12.5 mg by mouth 2 (two) times daily. 02/02/22   [provider]  clonazePAM (KLONOPIN) 0.25 MG disintegrating tablet Take 1 tablet (0.25 mg total) by mouth 2 (two) times daily as needed for up to 3 days (anxiety). 03/04/22 04/06/22   Hosie Poisson, MD  furosemide (LASIX) 40 MG tablet Take 1 tablet (40 mg total) by mouth daily. Patient taking differently: Take 40 mg by mouth every other day. 06/06/19   Evans Lance, MD  insulin aspart (NOVOLOG) 100 UNIT/ML injection CBG 70 - 120: 0 units  CBG 121 - 150: 1 unit  CBG 151 - 200: 2 units  CBG 201 - 250: 3 units  CBG 251 - 300: 5 units  CBG 301 - 350: 7 units  CBG 351 - 400: 9 units 03/04/22   Hosie Poisson, MD  losartan (COZAAR) 25 MG tablet Take 12.5 mg by mouth daily. 06/19/20   [provider]  meloxicam (MOBIC) 7.5 MG tablet Take 1 tablet (7.5 mg total) by mouth daily. 04/16/22   Carole Civil, MD  Multiple Vitamin (MULTIVITAMIN WITH MINERALS) TABS tablet Take 1 tablet by mouth daily. 03/05/22   Hosie Poisson, MD  omeprazole (PRILOSEC) 20 MG capsule Take 20 mg by mouth daily.    [provider]  ondansetron (ZOFRAN) 4 MG tablet Take 1 tablet (4 mg total) by mouth every 8 (eight) hours as needed for up to 20 doses for nausea or vomiting. 03/30/22   Clance Boll, PA-C  potassium chloride SA (K-DUR,KLOR-CON) 20 MEQ tablet Take 20 mEq by mouth 2 (two) times daily.    [provider]  rosuvastatin (  CRESTOR) 40 MG tablet TAKE ONE (1) TABLET BY MOUTH EVERY DAY (STOP ATORVASTATIN) 01/29/20   Corum, Rex Kras, MD  sertraline (ZOLOFT) 100 MG tablet Take 1 tablet (100 mg total) by mouth daily. 03/05/22   Hosie Poisson, MD  tiZANidine (ZANAFLEX) 4 MG tablet Take 1 tablet 3 times a day by oral route as needed. Do not drink alcohol or drive while taking this medication. May cause drowsiness 05/05/22   Volney American, PA-C  zolpidem (AMBIEN) 10 MG tablet TAKE 1 AND 1/2 TABLETS BY MOUTH AT BEDTIME 12/04/21   [provider]      Allergies    Patient has no known allergies.    Review of Systems   Review of Systems  Constitutional:  Negative for fever.  HENT:  Negative for sore throat.   Respiratory:  Negative for shortness of breath.    Cardiovascular:  Negative for chest pain.  Gastrointestinal:  Negative for abdominal pain.  Genitourinary:  Negative for dysuria.  Musculoskeletal:  Positive for gait problem.  Skin:  Negative for rash.  Neurological:  Negative for headaches.    Physical Exam Updated Vital Signs BP 116/82 (BP Location: Right Arm)   Pulse (!) 105   Temp 98.2 F (36.8 C) (Oral)   Resp 17   Ht '5\' 1"'$  (1.549 m)   Wt 62.7 kg   SpO2 90%   BMI 26.12 kg/m  Physical Exam Vitals and nursing note reviewed.  Constitutional:      General: She is not in acute distress.    Appearance: Normal appearance. She is well-developed.  HENT:     Head: Normocephalic and atraumatic.  Eyes:     Conjunctiva/sclera: Conjunctivae normal.  Cardiovascular:     Rate and Rhythm: Normal rate and regular rhythm.     Heart sounds: No murmur heard. Pulmonary:     Effort: Pulmonary effort is normal. No respiratory distress.     Breath sounds: Normal breath sounds.  Abdominal:     Palpations: Abdomen is soft.     Tenderness: There is no abdominal tenderness. There is no guarding or rebound.  Musculoskeletal:        General: Tenderness present. No swelling.     Cervical back: Neck supple.     Comments: She has tenderness throughout the left groin and proximal thigh.  There is no masses appreciated.  Distal pulses motor and sensation intact.  Abdomen benign.  Skin:    General: Skin is warm and dry.     Capillary Refill: Capillary refill takes less than 2 seconds.  Neurological:     Mental Status: She is alert.     Sensory: No sensory deficit.     Motor: No weakness.     ED Results / Procedures / Treatments   Labs (all labs ordered are listed, but only abnormal results are displayed) Labs Reviewed  BASIC METABOLIC PANEL - Abnormal; Notable for the following components:      Result Value   Glucose, Bld 101 (*)    BUN 35 (*)    Creatinine, Ser 2.13 (*)    GFR, Estimated 25 (*)    All other components within normal  limits  CBC WITH DIFFERENTIAL/PLATELET - Abnormal; Notable for the following components:   RBC 3.85 (*)    Hemoglobin 11.6 (*)    All other components within normal limits  URINALYSIS, ROUTINE W REFLEX MICROSCOPIC - Abnormal; Notable for the following components:   Leukocytes,Ua SMALL (*)    All other components  within normal limits    EKG None  Radiology CT ABDOMEN PELVIS WO CONTRAST  Result Date: 05/18/2022 CLINICAL DATA:  Pain left lower quadrant EXAM: CT ABDOMEN AND PELVIS WITHOUT CONTRAST TECHNIQUE: Multidetector CT imaging of the abdomen and pelvis was performed following the standard protocol without IV contrast. RADIATION DOSE REDUCTION: This exam was performed according to the departmental dose-optimization program which includes automated exposure control, adjustment of the mA and/or kV according to patient size and/or use of iterative reconstruction technique. COMPARISON:  05/06/2022 FINDINGS: Lower chest: Pacer leads are noted in place. Visualized lower lung fields are unremarkable. Hepatobiliary: No focal abnormalities are seen. Surgical clips are seen in gallbladder fossa. There is no dilation of bile ducts. Pancreas: No focal abnormalities are seen in the pancreas. Spleen: Small calcified granuloma is seen in the spleen. Spleen is not enlarged. Adrenals/Urinary Tract: Adrenals are unremarkable. There is no hydronephrosis. There are foci of cortical thinning in the kidney suggesting possible scarring. Lobulations are seen in the margins of the kidneys. There are no renal or ureteral stones. Urinary bladder is distended measuring 13.4 cm in length. There is no wall thickening in the bladder. Stomach/Bowel: Small hiatal hernia is seen. Stomach is unremarkable. Small bowel loops are not dilated. There is incomplete distention of proximal duodenum. Appendix is not dilated. There is no significant wall thickening in colon. Moderate to large amount of stool is seen in the lumen of the colon  and rectum. There is no pericolic stranding. Vascular/Lymphatic: Scattered arterial calcifications are seen. Reproductive: Unremarkable. Other: There is no ascites or pneumoperitoneum. Small umbilical hernia containing fat is seen. Minimal stranding in subcutaneous plane in the anterior abdominal wall has not changed significantly. Bilateral inguinal hernias containing fat are seen, larger on the left side. Musculoskeletal: There is surgical fusion from L3-S1 levels. Significant degenerative changes are noted in the left hip. IMPRESSION: There is no evidence of intestinal obstruction or pneumoperitoneum. There is no hydronephrosis. Appendix is not dilated. Small hiatal hernia. Bilateral inguinal hernias containing fat, larger on the left side. Other findings as described in the body of the report. Electronically Signed   By: Elmer Picker M.D.   On: 05/18/2022 21:02    Procedures Procedures    Medications Ordered in ED Medications  HYDROmorphone (DILAUDID) injection 1 mg (1 mg Intravenous Given 05/18/22 1825)  sodium chloride 0.9 % bolus 1,000 mL (0 mLs Intravenous Stopped 05/18/22 2038)    ED Course/ Medical Decision Making/ A&P Clinical Course as of 05/19/22 1140  Mon May 18, 2022  2121 Reviewed results of work-up with patient.  Offered to change her pain medicine to oxycodone but she says she does not like the way it makes her feel.  Recommended hydration and bowel regiment.  Recommended that she continue to follow-up with orthopedics she said she has an appointment on Wednesday.  I also asked that she reach out to Dr. Arnoldo Morale to see if she can move his appointment up although it does not look like there is an acute surgical emergency going on.  Return instructions discussed [MB]    Clinical Course User Index [MB] Hayden Rasmussen, MD                           Medical Decision Making Amount and/or Complexity of Data Reviewed Labs: ordered. Radiology: ordered.  Risk Prescription drug  management.   This patient complains of severe left groin and hip pain; this involves an extensive number  of treatment Options and is a complaint that carries with it a high risk of complications and morbidity. The differential includes musculoskeletal pain, hernia, fracture, arthritis, bursitis, septic hip, renal colic  I ordered, reviewed and interpreted labs, which included CBC with normal white count, hemoglobin low similar to priors, chemistries with new AKI, urinalysis without clear signs of infection I ordered medication IV fluids IV Dilaudid with improvement in her symptoms and reviewed PMP when indicated. I ordered imaging studies which included CT abdomen and pelvis without contrast as patient has elevated creatinine and I independently    visualized and interpreted imaging which showed bilateral inguinal hernias containing fat no other acute findings Previous records obtained and reviewed in epic including recent orthopedic visits and joint injection by radiology   Social determinants considered, no significant barriers Critical Interventions: None  After the interventions stated above, I reevaluated the patient and found patient's pain to be improved Admission and further testing considered, no indications for admission or further testing at this time.  Recommended patient follow-up with her primary care doctor and treatment team for further evaluation of her symptoms.  Return instructions discussed         Final Clinical Impression(s) / ED Diagnoses Final diagnoses:  Left groin pain  AKI (acute kidney injury) ([place])    Rx / DC Orders ED Discharge Orders     None         Hayden Rasmussen, MD 05/19/22 1142

## 2022-05-20 ENCOUNTER — Ambulatory Visit (INDEPENDENT_AMBULATORY_CARE_PROVIDER_SITE_OTHER): Payer: Medicare Other | Admitting: Orthopedic Surgery

## 2022-05-20 DIAGNOSIS — R82998 Other abnormal findings in urine: Secondary | ICD-10-CM | POA: Diagnosis not present

## 2022-05-20 DIAGNOSIS — R1032 Left lower quadrant pain: Secondary | ICD-10-CM | POA: Diagnosis not present

## 2022-05-20 DIAGNOSIS — M1612 Unilateral primary osteoarthritis, left hip: Secondary | ICD-10-CM | POA: Diagnosis not present

## 2022-05-20 DIAGNOSIS — I255 Ischemic cardiomyopathy: Secondary | ICD-10-CM | POA: Diagnosis not present

## 2022-05-20 DIAGNOSIS — M169 Osteoarthritis of hip, unspecified: Secondary | ICD-10-CM | POA: Diagnosis not present

## 2022-05-20 DIAGNOSIS — Z23 Encounter for immunization: Secondary | ICD-10-CM | POA: Diagnosis not present

## 2022-05-20 DIAGNOSIS — K4020 Bilateral inguinal hernia, without obstruction or gangrene, not specified as recurrent: Secondary | ICD-10-CM | POA: Diagnosis not present

## 2022-05-20 MED ORDER — METHYLPREDNISOLONE ACETATE 40 MG/ML IJ SUSP
40.0000 mg | Freq: Once | INTRAMUSCULAR | Status: AC
  Administered 2022-05-20: 40 mg via INTRAMUSCULAR

## 2022-05-20 NOTE — Addendum Note (Signed)
Addended byCandice Camp on: 05/20/2022 11:43 AM   Modules accepted: Orders

## 2022-05-20 NOTE — Progress Notes (Signed)
Chief Complaint  Patient presents with   Results    CT LEFT HIP, in a lot of pain     Follow-up after CT scan left hip.  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  Recommend that she get a total hip replacement and made the referral  IM shot left hip Depo-Medrol and lidocaine 40 mg Depo-Medrol 2 cc 1% lidocaine Ethyl chloride  Alcohol  25-gauge needle injected no complications

## 2022-05-20 NOTE — Patient Instructions (Signed)
We are referring you to [facility] from [facility] address is [place] The phone number is [phone]  The office will call you with an appointment Dr. Ninfa Linden   Refer for Anterior hip replacement

## 2022-05-21 ENCOUNTER — Encounter: Payer: Self-pay | Admitting: General Surgery

## 2022-05-21 ENCOUNTER — Ambulatory Visit (INDEPENDENT_AMBULATORY_CARE_PROVIDER_SITE_OTHER): Payer: Medicare Other | Admitting: General Surgery

## 2022-05-21 VITALS — BP 120/73 | HR 87 | Temp 98.1°F | Resp 12 | Ht 61.0 in | Wt 135.0 lb

## 2022-05-21 DIAGNOSIS — K4020 Bilateral inguinal hernia, without obstruction or gangrene, not specified as recurrent: Secondary | ICD-10-CM | POA: Diagnosis not present

## 2022-05-21 DIAGNOSIS — R1032 Left lower quadrant pain: Secondary | ICD-10-CM | POA: Diagnosis not present

## 2022-05-21 NOTE — Patient Instructions (Addendum)
Will review your CT with radiology to ensure not missing anything.  Someone will call you and update you about this discussion.   Inguinal Hernia, Adult An inguinal hernia develops when fat or the intestines push through a weak spot in a muscle where the leg meets the lower abdomen (groin). This creates a bulge. This kind of hernia could also be: In the scrotum, if you are female. In folds of skin around the vagina, if you are female. There are three types of inguinal hernias: Hernias that can be pushed back into the abdomen (are reducible). This type rarely causes pain. Hernias that are not reducible (are incarcerated). Hernias that are not reducible and lose their blood supply (are strangulated). This type of hernia requires emergency surgery. What are the causes? This condition is caused by having a weak spot in the muscles or tissues in your groin. This develops over time. The hernia may poke through the weak spot when you suddenly strain your lower abdominal muscles, such as when you: Lift a heavy object. Strain to have a bowel movement. Constipation can lead to straining. Cough. What increases the risk? This condition is more likely to develop in: Males. Pregnant females. People who: Are overweight. Work in jobs that require long periods of standing or heavy lifting. Have had an inguinal hernia before. Smoke or have lung disease. These factors can lead to long-term (chronic) coughing. What are the signs or symptoms? Symptoms may depend on the size of the hernia. Often, a small inguinal hernia has no symptoms. Symptoms of a larger hernia may include: A bulge in the groin area. This is easier to see when standing. It might not be visible when lying down. Pain or burning in the groin. This may get worse when lifting, straining, or coughing. A dull ache or a feeling of pressure in the groin. An unusual bulge in the scrotum, in males. Symptoms of a strangulated inguinal hernia may  include: A bulge in your groin that is very painful and tender to the touch. A bulge that turns red or purple. Fever, nausea, and vomiting. Inability to have a bowel movement or to pass gas. How is this diagnosed? This condition is diagnosed based on your symptoms, your medical history, and a physical exam. Your health care provider may feel your groin area and ask you to cough. How is this treated? Treatment depends on the size of your hernia and whether you have symptoms. If you do not have symptoms, your health care provider may have you watch your hernia carefully and have you come in for follow-up visits. If your hernia is large or if you have symptoms, you may need surgery to repair the hernia. Follow these instructions at home: Lifestyle Avoid lifting heavy objects. Avoid standing for long periods of time. Do not use any products that contain nicotine or tobacco. These products include cigarettes, chewing tobacco, and vaping devices, such as e-cigarettes. If you need help quitting, ask your health care provider. Maintain a healthy weight. Preventing constipation You may need to take these actions to prevent or treat constipation: Drink enough fluid to keep your urine pale yellow. Take over-the-counter or prescription medicines. Eat foods that are high in fiber, such as beans, whole grains, and fresh fruits and vegetables. Limit foods that are high in fat and processed sugars, such as fried or sweet foods. General instructions You may try to push the hernia back in place by very gently pressing on it while lying down. Do not  try to force the bulge back in if it will not push in easily. Watch your hernia for any changes in shape, size, or color. Get help right away if you notice any changes. Take over-the-counter and prescription medicines only as told by your health care provider. Keep all follow-up visits. This is important. Contact a health care provider if: You have a fever or  chills. You develop new symptoms. Your symptoms get worse. Get help right away if: You have pain in your groin that suddenly gets worse. You have a bulge in your groin that: Suddenly gets bigger and does not get smaller. Becomes red or purple or painful to the touch. You are a man and you have a sudden pain in your scrotum, or the size of your scrotum suddenly changes. You cannot push the hernia back in place by very gently pressing on it when you are lying down. You have nausea or vomiting that does not go away. You have a fast heartbeat. You cannot have a bowel movement or pass gas. These symptoms may represent a serious problem that is an emergency. Do not wait to see if the symptoms will go away. Get medical help right away. Call your local emergency services (911 in the [place] Summary An inguinal hernia develops when fat or the intestines push through a weak spot in a muscle where your leg meets your lower abdomen (groin). This condition is caused by having a weak spot in muscles or tissues in your groin. Symptoms may depend on the size of the hernia, and they may include pain or swelling in your groin. A small inguinal hernia often has no symptoms. Treatment may not be needed if you do not have symptoms. If you have symptoms or a large hernia, you may need surgery to repair the hernia. Avoid lifting heavy objects. Also, avoid standing for long periods of time. This information is not intended to replace advice given to you by your health care provider. Make sure you discuss any questions you have with your health care provider. Document Revised: 04/30/2020 Document Reviewed: 04/30/2020 Elsevier Patient Education  [place].

## 2022-05-21 NOTE — Progress Notes (Signed)
[facility] History and Physical  Reason for Referral: Bilateral inguinal hernias with fat Referring Physician: ED  Chief Complaint   Hernia     [name] is a 71 y.o. female.  HPI: [name] is a 71 yo who comes in with severe left upper thigh pain that is radiating from her inner thigh to her hip and down her leg. She saw Dr. Aline Brochure regarding her pain and hip CT findings and he recommended that she get a total hip replacement. At the same time the patient was being evaluated in the ED for her pain she had CT scans that demonstrated fat containing hernias. She was referred for this to see if this was causing her pain. She is in excruciating pain and wants to get some relief.   Past Medical History:  Diagnosis Date   Anxiety    Cardiomyopathy    Cervical high risk HPV (human papillomavirus) test positive 01/30/2015   Chronic alcohol abuse 06/09/2020   Chronic constipation    Chronic lower back pain    Constipation 01/30/2015   Depression    GERD (gastroesophageal reflux disease)    Heart failure    Chronic combined   HTN (hypertension)    Hypercholesteremia    Neurogenic bladder    2008 nerve damage s/p back ssurgery   Presence of permanent cardiac pacemaker    Type II diabetes mellitus ([place])     Past Surgical History:  Procedure Laterality Date   ACHILLES TENDON SURGERY Right 06/2012   BACK SURGERY  2005   BI-VENTRICULAR IMPLANTABLE CARDIOVERTER DEFIBRILLATOR  (CRT-D)  12-18-2013   upgrade of previously implanted dual chamber pacemaker to MDT CRTD with RV lead revision by Dr Placido Sou PACEMAKER UPGRADE  12/18/2013   w/defibrillator   BIV PACEMAKER GENERATOR CHANGE OUT N/A 12/18/2013   Procedure: BIV PACEMAKER GENERATOR CHANGE OUT;  Surgeon: Evans Lance, MD;  Location: [facility] CATH LAB;  Service: Cardiovascular;  Laterality: N/A;   BIV PACEMAKER INSERTION CRT-P N/A 02/22/2020   Procedure: DOWNGRADE BIV PACEMAKER INSERTION CRT-P;   Surgeon: Evans Lance, MD;  Location: [place] CV LAB;  Service: Cardiovascular;  Laterality: N/A;   BREAST BIOPSY Left 1973   benign   BREAST LUMPECTOMY Left [place]   CHOLECYSTECTOMY     GASTROCNEMIUS RECESSION  06/24/2012   Procedure: GASTROCNEMIUS SLIDE;  Surgeon: Colin Rhein, MD;  Location: [facility] ORS;  Service: Orthopedics;  Laterality: Right;   HERNIA REPAIR     LACERATION REPAIR Left 02/17/2022   Procedure: REPAIR LEFT CHEEK COMPLEX LACERATION WITH MYRIAD PLACEMENT;  Surgeon: Wallace Going, DO;  Location: [place];  Service: Plastics;  Laterality: Left;   LEAD REVISION N/A 12/18/2013   Procedure: LEAD REVISION;  Surgeon: Evans Lance, MD;  Location: [facility] CATH LAB;  Service: Cardiovascular;  Laterality: N/A;   PACEMAKER INSERTION  2006   Dual chamber MDT pacemaker implanted - subsequent upgrade to CRTD 12-2013   POSTERIOR LUMBAR FUSION  2006; 2008   SIGMOIDOSCOPY  MAY 2011 w/ PROP   iTCS-->FSIG-poor bowel prep   SKIN FULL THICKNESS GRAFT Left 04/06/2022   Procedure: SKIN GRAFT FULL THICKNESS;  Surgeon: Wallace Going, DO;  Location: [place];  Service: Plastics;  Laterality: Left;   TUBAL LIGATION  ~ 1984   UPPER GASTROINTESTINAL ENDOSCOPY  MAY 2011 w/ PROP   MILD GASTRITIS 2o ETOH    Family History  Problem Relation Age of  Onset   Alzheimer's disease Mother    Diabetes Mother    Cancer Father    Cancer Sister        breast   Diabetes Sister    Diabetes Brother    Brain cancer Brother    Diabetes Brother    Colon cancer Neg Hx    Colon polyps Neg Hx     Social History   Tobacco Use   Smoking status: Never   Smokeless tobacco: Never  Vaping Use   Vaping Use: Never used  Substance Use Topics   Alcohol use: Yes    Alcohol/week: 0.0 standard drinks of alcohol    Comment: drinks beer or wine three times a week   Drug use: No    Medications: I have reviewed the patient's current medications. Allergies as of  05/21/2022   No Known Allergies      Medication List        Accurate as of May 21, 2022 12:41 PM. If you have any questions, ask your nurse or doctor.          aspirin EC 81 MG tablet Take 81 mg by mouth daily.   buPROPion 300 MG 24 hr tablet Commonly known as: WELLBUTRIN XL Take 300 mg by mouth daily.   Bydureon BCise 2 MG/0.85ML [place] Generic drug: Exenatide ER Inject 2 mg into the skin once a week.   carvedilol 12.5 MG tablet Commonly known as: COREG Take 12.5 mg by mouth 2 (two) times daily.   clonazePAM 0.25 MG disintegrating tablet Commonly known as: KLONOPIN Take 1 tablet (0.25 mg total) by mouth 2 (two) times daily as needed for up to 3 days (anxiety).   furosemide 40 MG tablet Commonly known as: LASIX Take 1 tablet (40 mg total) by mouth daily. What changed: when to take this   HYDROmorphone 2 MG tablet Commonly known as: DILAUDID Take 2 mg by mouth 3 (three) times daily.   insulin aspart 100 UNIT/ML injection Commonly known as: novoLOG CBG 70 - 120: 0 units  CBG 121 - 150: 1 unit  CBG 151 - 200: 2 units  CBG 201 - 250: 3 units  CBG 251 - 300: 5 units  CBG 301 - 350: 7 units  CBG 351 - 400: 9 units   losartan 25 MG tablet Commonly known as: COZAAR Take 12.5 mg by mouth daily.   meloxicam 7.5 MG tablet Commonly known as: Mobic Take 1 tablet (7.5 mg total) by mouth daily.   multivitamin with minerals Tabs tablet Take 1 tablet by mouth daily.   omeprazole 20 MG capsule Commonly known as: PRILOSEC Take 20 mg by mouth daily.   ondansetron 4 MG tablet Commonly known as: Zofran Take 1 tablet (4 mg total) by mouth every 8 (eight) hours as needed for up to 20 doses for nausea or vomiting.   potassium chloride SA 20 MEQ tablet Commonly known as: KLOR-CON M Take 20 mEq by mouth 2 (two) times daily.   rosuvastatin 40 MG tablet Commonly known as: CRESTOR TAKE ONE (1) TABLET BY MOUTH EVERY DAY (STOP ATORVASTATIN)   sertraline 100 MG  tablet Commonly known as: ZOLOFT Take 1 tablet (100 mg total) by mouth daily.   SYSTANE CONTACTS OP Place 1 drop into both eyes 2 (two) times daily.   tiZANidine 4 MG tablet Commonly known as: ZANAFLEX Take 1 tablet 3 times a day by oral route as needed. Do not drink alcohol or drive while taking this medication. May cause  drowsiness   zolpidem 10 MG tablet Commonly known as: AMBIEN TAKE 1 AND 1/2 TABLETS BY MOUTH AT BEDTIME         ROS:  A comprehensive review of systems was negative except for: Gastrointestinal: positive for reflux symptoms Musculoskeletal: positive for joint pain, back pain, upper left thigh pain into hip and groin Endocrine: positive for temperature intolerance and tired and sluggish  Blood pressure 120/73, pulse 87, temperature 98.1 F (36.7 C), temperature source Oral, resp. rate 12, height _0  (1.549 m), weight 135 lb (61.2 kg), SpO2 97 %. Physical Exam Vitals reviewed.  Constitutional:      Appearance: Normal appearance.  HENT:     Head: Normocephalic.     Nose: Nose normal.     Mouth/Throat:     Mouth: Mucous membranes are moist.  Eyes:     Extraocular Movements: Extraocular movements intact.  Cardiovascular:     Rate and Rhythm: Normal rate and regular rhythm.  Pulmonary:     Effort: Pulmonary effort is normal.     Breath sounds: Normal breath sounds.  Abdominal:     General: There is no distension.     Palpations: Abdomen is soft.     Tenderness: There is no abdominal tenderness.     Hernia: A hernia is present. Hernia is present in the left inguinal area and right inguinal area. There is no hernia in the right femoral area or left femoral area.     Comments: Reducible hernias but no tenderness in the inguinal hernia    Musculoskeletal:        General: Normal range of motion.     Cervical back: Normal range of motion.  Skin:    General: Skin is warm.  Neurological:     General: No focal deficit present.     Mental Status: She is  alert and oriented to person, place, and time.  Psychiatric:        Mood and Affect: Mood normal.        Behavior: Behavior normal.        Thought Content: Thought content normal.        Judgment: Judgment normal.     Results: CT hip and a/p - reviewed patient with Dr. Thornton Papas, radiology- no signs of any femoral hernias and inguinal hernia with fat, hip with bone on bone   Assessment & Plan:  BURNIS KASER is a 71 y.o. female with left groin into the hip pain but no signs of any femoral hernia. She has bilateral inguinal hernias with fat that are not symptomatic. I have reviewed the imaging with radiology and have called the patient back after doing this and notified her that there is no hernia that is causing her pain. She is having the pain most likely from her Hip. She is already referred for Hip replacement.  Dr. Aline Brochure is aware of patient and agrees.   Discussed inguinal hernias, incarceration, reasons to go to the Ed and if she gets symptomatic from these to let us know.   Future Appointments  Date Time Provider [place]  06/08/2022 10:15 AM Mcarthur Rossetti, MD [facility] None  06/15/2022 11:20 AM Carole Civil, MD [facility] None  06/16/2022  1:30 PM Evans Lance, MD [facility]  07/31/2022 10:45 AM Dillingham, Loel Lofty, DO [facility] None  08/21/2022  7:00 AM [facility] DEVICE REMOTES [facility]  11/20/2022  7:00 AM [facility] DEVICE REMOTES [facility]  02/19/2023  7:00 AM [facility]  DEVICE REMOTES [facility]  05/21/2023  7:00 AM [facility] DEVICE REMOTES [facility]     All questions were answered to the satisfaction of the patient. PRN Follow up    Virl Cagey 05/21/2022, 12:41 PM

## 2022-05-22 ENCOUNTER — Encounter (HOSPITAL_COMMUNITY): Payer: Medicare Other

## 2022-05-22 DIAGNOSIS — I255 Ischemic cardiomyopathy: Secondary | ICD-10-CM

## 2022-05-26 LAB — CUP PACEART REMOTE DEVICE CHECK
Battery Remaining Longevity: 88 mo
Battery Voltage: 2.99 V
Brady Statistic AP VP Percent: 0.01 %
Brady Statistic AP VS Percent: 0.02 %
Brady Statistic AS VP Percent: 98.68 %
Brady Statistic AS VS Percent: 1.29 %
Brady Statistic RA Percent Paced: 0.03 %
Brady Statistic RV Percent Paced: 19.94 %
Date Time Interrogation Session: 20230908044748
Implantable Lead Implant Date: 20060214
Implantable Lead Implant Date: 20150406
Implantable Lead Implant Date: 20150406
Implantable Lead Location: 753858
Implantable Lead Location: 753859
Implantable Lead Location: 753860
Implantable Lead Model: 4396
Implantable Lead Model: 5076
Implantable Pulse Generator Implant Date: 20210610
Lead Channel Impedance Value: 1368 Ohm
Lead Channel Impedance Value: 1482 Ohm
Lead Channel Impedance Value: 380 Ohm
Lead Channel Impedance Value: 475 Ohm
Lead Channel Impedance Value: 570 Ohm
Lead Channel Impedance Value: 589 Ohm
Lead Channel Impedance Value: 684 Ohm
Lead Channel Impedance Value: 931 Ohm
Lead Channel Impedance Value: 931 Ohm
Lead Channel Pacing Threshold Amplitude: 1.25 V
Lead Channel Pacing Threshold Amplitude: 1.5 V
Lead Channel Pacing Threshold Amplitude: 1.75 V
Lead Channel Pacing Threshold Pulse Width: 0.4 ms
Lead Channel Pacing Threshold Pulse Width: 0.4 ms
Lead Channel Pacing Threshold Pulse Width: 0.8 ms
Lead Channel Sensing Intrinsic Amplitude: 12.125 mV
Lead Channel Sensing Intrinsic Amplitude: 12.125 mV
Lead Channel Sensing Intrinsic Amplitude: 2.25 mV
Lead Channel Sensing Intrinsic Amplitude: 2.25 mV
Lead Channel Setting Pacing Amplitude: 2 V
Lead Channel Setting Pacing Amplitude: 2.25 V
Lead Channel Setting Pacing Amplitude: 3 V
Lead Channel Setting Pacing Pulse Width: 0.4 ms
Lead Channel Setting Pacing Pulse Width: 0.8 ms
Lead Channel Setting Sensing Sensitivity: 0.9 mV

## 2022-06-05 DIAGNOSIS — Z0000 Encounter for general adult medical examination without abnormal findings: Secondary | ICD-10-CM | POA: Diagnosis not present

## 2022-06-06 NOTE — Progress Notes (Signed)
Remote pacemaker transmission.   

## 2022-06-08 ENCOUNTER — Ambulatory Visit (INDEPENDENT_AMBULATORY_CARE_PROVIDER_SITE_OTHER): Payer: Medicare Other | Admitting: Orthopaedic Surgery

## 2022-06-08 DIAGNOSIS — M1612 Unilateral primary osteoarthritis, left hip: Secondary | ICD-10-CM | POA: Diagnosis not present

## 2022-06-08 DIAGNOSIS — M25552 Pain in left hip: Secondary | ICD-10-CM

## 2022-06-08 DIAGNOSIS — G8929 Other chronic pain: Secondary | ICD-10-CM

## 2022-06-08 DIAGNOSIS — I255 Ischemic cardiomyopathy: Secondary | ICD-10-CM | POA: Diagnosis not present

## 2022-06-08 NOTE — Progress Notes (Signed)
The patient is sent to me from Dr. Ples Specter in [place] to evaluate and treat severe arthritis of the left hip.  She actually has plain films and a CT scan on the canopy system.  Her hip arthritis is quite significant and is debilitating.  She is in tears crying in the office.  She is on chronic narcotic pain medication the pain clinic.  She has had significant spine surgery in the past.  She is able with a cane.  She is an individual.  She is a diabetic but reports that her hemoglobin A1c is below 7.  She said her pain is so severe that it is debilitating and it is detrimentally affecting her mobility, her quality of life and actives daily living and she would like to proceed with a hip replacement as soon as possible.  There is significant limitations of range of motion of her left hip secondary to her pain.  There is stiffness with exam of the left hip.  The CT scan shows complete loss of joint space with bone-on-bone wear.  There are osteophytes around the hip and sclerotic changes.  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  We will work on getting her on the schedule.  I showed her hip replacement model and her scans.  I described what the surgery involves in detail and described the risk and benefits of surgery.  Pain control is currently on to be an issue and there is nothing I can really do right now until we schedule the surgery.  All questions and concerns were answered and addressed.  We will work on getting her on the schedule.

## 2022-06-10 ENCOUNTER — Ambulatory Visit: Payer: Medicare Other | Admitting: General Surgery

## 2022-06-10 ENCOUNTER — Other Ambulatory Visit: Payer: Self-pay | Admitting: Physician Assistant

## 2022-06-11 ENCOUNTER — Encounter: Payer: Self-pay | Admitting: Internal Medicine

## 2022-06-11 NOTE — Progress Notes (Signed)
[place] DEVICE PROGRAMMING  Patient Information: Name:  [name]  DOB:  1950-12-14  MRN:  [id number]    Planned Procedure:  Left total hip arthroplasty anterior approach  Surgeon:  Dr. Ninfa Linden  Date of Procedure:  06/18/22  Cautery will be used.  Position during surgery:  unknown   Device Information:  Clinic EP Physician:  Cristopher Peru, MD   Device Type:  Pacemaker Manufacturer and Phone #:  Medtronic: [phone] Pacemaker Dependent?:  No. Date of Last Device Check:  05/22/22 last remote/ sees Dr. Lovena Le on 06/16/22 Normal Device Function?:  Yes.    Electrophysiologist's Recommendations:  Have magnet available. Provide continuous ECG monitoring when magnet is used or reprogramming is to be performed.  Procedure should not interfere with device function.  No device programming or magnet placement needed.  Per Device Clinic Standing Orders, Diamond Nickel, RN  2:02 PM 06/11/2022

## 2022-06-11 NOTE — Patient Instructions (Addendum)
SURGICAL WAITING ROOM VISITATION Patients having surgery or a procedure may have no more than 2 support people in the waiting area - these visitors may rotate.   Children under the age of 3 must have an adult with them who is not the patient. If the patient needs to stay at the hospital during part of their recovery, the visitor guidelines for inpatient rooms apply. Pre-op nurse will coordinate an appropriate time for 1 support person to accompany patient in pre-op.  This support person may not rotate.    Please refer to the [facility] website for the visitor guidelines for Inpatients (after your surgery is over and you are in a regular room).    Your procedure is scheduled on: 06/18/22   Report to [facility] Main Entrance    Report to admitting at 7:30 AM   Call this number if you have problems the morning of surgery [phone]   Do not eat food :After Midnight.   After Midnight you may have the following liquids until 7:00 AM DAY OF SURGERY  Water Non-Citrus Juices (without pulp, NO RED) Carbonated Beverages Black Coffee (NO MILK/CREAM OR CREAMERS, sugar ok)  Clear Tea (NO MILK/CREAM OR CREAMERS, sugar ok) regular and decaf                             Plain Jell-O (NO RED)                                           Fruit ices (not with fruit pulp, NO RED)                                     Popsicles (NO RED)                                                               Sports drinks like Gatorade (NO RED)                The day of surgery:  Drink ONE (1) Pre-Surgery G2 at 7:00 AM the morning of surgery. Drink in one sitting. Do not sip.  This drink was given to you during your hospital  pre-op appointment visit. Nothing else to drink after completing the  Pre-Surgery G2.          If you have questions, please contact your surgeon's office.   FOLLOW BOWEL PREP AND ANY ADDITIONAL PRE OP INSTRUCTIONS YOU RECEIVED FROM YOUR SURGEON'S OFFICE!!!     Oral Hygiene is  also important to reduce your risk of infection.                                    Remember - BRUSH YOUR TEETH THE MORNING OF SURGERY WITH YOUR REGULAR TOOTHPASTE   Take these medicines the morning of surgery with A SIP OF WATER: Bupropion, Carvedilol, Clonazepam, Norco, Omeprazole, Rosuvastatin, Sertraline   DO NOT TAKE ANY ORAL DIABETIC MEDICATIONS DAY OF YOUR SURGERY  How to  Manage Your Diabetes Before and After Surgery  Why is it important to control my blood sugar before and after surgery? Improving blood sugar levels before and after surgery helps healing and can limit problems. A way of improving blood sugar control is eating a healthy diet by:  Eating less sugar and carbohydrates  Increasing activity/exercise  Talking with your doctor about reaching your blood sugar goals High blood sugars (greater than 180 mg/dL) can raise your risk of infections and slow your recovery, so you will need to focus on controlling your diabetes during the weeks before surgery. Make sure that the doctor who takes care of your diabetes knows about your planned surgery including the date and location.  How do I manage my blood sugar before surgery? Check your blood sugar at least 4 times a day, starting 2 days before surgery, to make sure that the level is not too high or low. Check your blood sugar the morning of your surgery when you wake up and every 2 hours until you get to the Short Stay unit. If your blood sugar is less than 70 mg/dL, you will need to treat for low blood sugar: Do not take insulin. Treat a low blood sugar (less than 70 mg/dL) with  cup of clear juice (cranberry or apple), 4 glucose tablets, OR glucose gel. Recheck blood sugar in 15 minutes after treatment (to make sure it is greater than 70 mg/dL). If your blood sugar is not greater than 70 mg/dL on recheck, call [phone] for further instructions. Report your blood sugar to the short stay nurse when you get to Short  Stay.  If you are admitted to the hospital after surgery: Your blood sugar will be checked by the staff and you will probably be given insulin after surgery (instead of oral diabetes medicines) to make sure you have good blood sugar levels. The goal for blood sugar control after surgery is 80-180 mg/dL.   WHAT DO I DO ABOUT MY DIABETES MEDICATION?  Do not take oral diabetes medicines (pills) the morning of surgery.  THE DAY BEFORE SURGERY, take Lantus and Humalog as prescribed.      THE MORNING OF SURGERY, take 50% of Lantus dose. Do not take Humalog unless blood sugar is greater than 220, then take 50% of normal dose.  The day of surgery, do not take other diabetes injectables, including Byetta (exenatide), Bydureon (exenatide ER), Victoza (liraglutide), or Trulicity (dulaglutide).  If your CBG is greater than 220 mg/dL, you may take  of your sliding scale  (correction) dose of insulin.  Reviewed and Endorsed by [facility] Patient Education Committee, August 2015                               You may not have any metal on your body including hair pins, jewelry, and body piercing             Do not wear make-up, lotions, powders, perfumes, or deodorant  Do not wear nail polish including gel and S&S, artificial/acrylic nails, or any other type of covering on natural nails including finger and toenails. If you have artificial nails, gel coating, etc. that needs to be removed by a nail salon please have this removed prior to surgery or surgery may need to be canceled/ delayed if the surgeon/ anesthesia feels like they are unable to be safely monitored.   Do not shave  48 hours prior to surgery.  Do not bring valuables to the hospital. [place].   Bring small overnight bag day of surgery.   DO NOT [place]. PHARMACY WILL DISPENSE MEDICATIONS LISTED ON YOUR MEDICATION LIST TO YOU DURING YOUR  ADMISSION [place]!    Special Instructions: Bring a copy of your healthcare power of attorney and living will documents the day of surgery if you haven't scanned them before.              Please read over the following fact sheets you were given: IF [place] [phone] Schneiders   If you received a COVID test during your pre-op visit  it is requested that you wear a mask when out in public, stay away from anyone that may not be feeling well and notify your surgeon if you develop symptoms. If you test positive for Covid or have been in contact with anyone that has tested positive in the last 10 days please notify you surgeon.     [place] - Preparing for Surgery Before surgery, you can play an important role.  Because skin is not sterile, your skin needs to be as free of germs as possible.  You can reduce the number of germs on your skin by washing with CHG (chlorahexidine gluconate) soap before surgery.  CHG is an antiseptic cleaner which kills germs and bonds with the skin to continue killing germs even after washing. Please DO NOT use if you have an allergy to CHG or antibacterial soaps.  If your skin becomes reddened/irritated stop using the CHG and inform your nurse when you arrive at Short Stay. Do not shave (including legs and underarms) for at least 48 hours prior to the first CHG shower.  You may shave your face/neck.  Please follow these instructions carefully:  1.  Shower with CHG Soap the night before surgery and the  morning of surgery.  2.  If you choose to wash your hair, wash your hair first as usual with your normal  shampoo.  3.  After you shampoo, rinse your hair and body thoroughly to remove the shampoo.                             4.  Use CHG as you would any other liquid soap.  You can apply chg directly to the skin and wash.  Gently with a scrungie or clean washcloth.  5.  Apply the CHG Soap to your body ONLY  FROM THE NECK DOWN.   Do   not use on face/ open                           Wound or open sores. Avoid contact with eyes, ears mouth and   genitals (private parts).                       Wash face,  Genitals (private parts) with your normal soap.             6.  Wash thoroughly, paying special attention to the area where your    surgery  will be performed.  7.  Thoroughly rinse your body with warm water from the neck down.  8.  DO  NOT shower/wash with your normal soap after using and rinsing off the CHG Soap.                9.  Pat yourself dry with a clean towel.            10.  Wear clean pajamas.            11.  Place clean sheets on your bed the night of your first shower and do not  sleep with pets. Day of Surgery : Do not apply any lotions/deodorants the morning of surgery.  Please wear clean clothes to the hospital/surgery center.  FAILURE TO FOLLOW THESE INSTRUCTIONS MAY RESULT IN THE CANCELLATION OF YOUR SURGERY  PATIENT SIGNATURE_________________________________  NURSE SIGNATURE__________________________________  ________________________________________________________________________   Adam Phenix  An incentive spirometer is a tool that can help keep your lungs clear and active. This tool measures how well you are filling your lungs with each breath. Taking long deep breaths may help reverse or decrease the chance of developing breathing (pulmonary) problems (especially infection) following: A long period of time when you are unable to move or be active. BEFORE THE PROCEDURE  If the spirometer includes an indicator to show your best effort, your nurse or respiratory therapist will set it to a desired goal. If possible, sit up straight or lean slightly forward. Try not to slouch. Hold the incentive spirometer in an upright position. INSTRUCTIONS FOR USE  Sit on the edge of your bed if possible, or sit up as far as you can in bed or on a chair. Hold the incentive  spirometer in an upright position. Breathe out normally. Place the mouthpiece in your mouth and seal your lips tightly around it. Breathe in slowly and as deeply as possible, raising the piston or the ball toward the top of the column. Hold your breath for 3-5 seconds or for as long as possible. Allow the piston or ball to fall to the bottom of the column. Remove the mouthpiece from your mouth and breathe out normally. Rest for a few seconds and repeat Steps 1 through 7 at least 10 times every 1-2 hours when you are awake. Take your time and take a few normal breaths between deep breaths. The spirometer may include an indicator to show your best effort. Use the indicator as a goal to work toward during each repetition. After each set of 10 deep breaths, practice coughing to be sure your lungs are clear. If you have an incision (the cut made at the time of surgery), support your incision when coughing by placing a pillow or rolled up towels firmly against it. Once you are able to get out of bed, walk around indoors and cough well. You may stop using the incentive spirometer when instructed by your caregiver.  RISKS AND COMPLICATIONS Take your time so you do not get dizzy or light-headed. If you are in pain, you may need to take or ask for pain medication before doing incentive spirometry. It is harder to take a deep breath if you are having pain. AFTER USE Rest and breathe slowly and easily. It can be helpful to keep track of a log of your progress. Your caregiver can provide you with a simple table to help with this. If you are using the spirometer at home, follow these instructions: [place] IF:  You are having difficultly using the spirometer. You have trouble using the spirometer as often as instructed. Your pain medication is not giving  enough relief while using the spirometer. You develop fever of 100.5 F (38.1 C) or higher. SEEK IMMEDIATE MEDICAL CARE IF:  You cough up bloody  sputum that had not been present before. You develop fever of 102 F (38.9 C) or greater. You develop worsening pain at or near the incision site. MAKE SURE YOU:  Understand these instructions. Will watch your condition. Will get help right away if you are not doing well or get worse. Document Released: 01/11/2007 Document Revised: 11/23/2011 Document Reviewed: 03/14/2007 Exit[employer] Patient Information 2014 [employer], [place].   ________________________________________________________________________  WHAT IS A BLOOD TRANSFUSION? Blood Transfusion Information  A transfusion is the replacement of blood or some of its parts. Blood is made up of multiple cells which provide different functions. Red blood cells carry oxygen and are used for blood loss replacement. White blood cells fight against infection. Platelets control bleeding. Plasma helps clot blood. Other blood products are available for specialized needs, such as hemophilia or other clotting disorders. BEFORE THE TRANSFUSION  Who gives blood for transfusions?  Healthy volunteers who are fully evaluated to make sure their blood is safe. This is blood bank blood. Transfusion therapy is the safest it has ever been in the practice of medicine. Before blood is taken from a donor, a complete history is taken to make sure that person has no history of diseases nor engages in risky social behavior (examples are intravenous drug use or sexual activity with multiple partners). The donor's travel history is screened to minimize risk of transmitting infections, such as malaria. The donated blood is tested for signs of infectious diseases, such as HIV and hepatitis. The blood is then tested to be sure it is compatible with you in order to minimize the chance of a transfusion reaction. If you or a relative donates blood, this is often done in anticipation of surgery and is not appropriate for emergency situations. It takes many days to process the donated  blood. RISKS AND COMPLICATIONS Although transfusion therapy is very safe and saves many lives, the main dangers of transfusion include:  Getting an infectious disease. Developing a transfusion reaction. This is an allergic reaction to something in the blood you were given. Every precaution is taken to prevent this. The decision to have a blood transfusion has been considered carefully by your caregiver before blood is given. Blood is not given unless the benefits outweigh the risks. AFTER THE TRANSFUSION Right after receiving a blood transfusion, you will usually feel much better and more energetic. This is especially true if your red blood cells have gotten low (anemic). The transfusion raises the level of the red blood cells which carry oxygen, and this usually causes an energy increase. The nurse administering the transfusion will monitor you carefully for complications. HOME CARE INSTRUCTIONS  No special instructions are needed after a transfusion. You may find your energy is better. Speak with your caregiver about any limitations on activity for underlying diseases you may have. SEEK MEDICAL CARE IF:  Your condition is not improving after your transfusion. You develop redness or irritation at the intravenous (IV) site. SEEK IMMEDIATE MEDICAL CARE IF:  Any of the following symptoms occur over the next 12 hours: Shaking chills. You have a temperature by mouth above 102 F (38.9 C), not controlled by medicine. Chest, back, or muscle pain. People around you feel you are not acting correctly or are confused. Shortness of breath or difficulty breathing. Dizziness and fainting. You get a rash or develop hives. You have  a decrease in urine output. Your urine turns a dark color or changes to pink, red, or brown. Any of the following symptoms occur over the next 10 days: You have a temperature by mouth above 102 F (38.9 C), not controlled by medicine. Shortness of breath. Weakness after  normal activity. The white part of the eye turns yellow (jaundice). You have a decrease in the amount of urine or are urinating less often. Your urine turns a dark color or changes to pink, red, or brown. Document Released: 08/28/2000 Document Revised: 11/23/2011 Document Reviewed: 04/16/2008 [facility] Patient Information 2014 [place], [place].  _______________________________________________________________________

## 2022-06-11 NOTE — Progress Notes (Addendum)
COVID Vaccine Completed: yes  Date of COVID positive in last 90 days: no  PCP - Allyn Kenner, MD Cardiologist - Cristopher Peru, MD Electrophysiologist- GreggTaylor, MD  Chest x-ray - 02/17/22 Epic EKG - 02/28/22 Epic Stress Test - n/a ECHO - 02/18/22 Epic Cardiac Cath - 19 years ago per pt Pacemaker/ICD device last checked: 05/22/22 Epic Spinal Cord Stimulator: n/a  Bowel Prep - no  Sleep Study - n/a CPAP -   Fasting Blood Sugar - 126-140 Checks Blood Sugar continuous glucose monitor 4-5 times a day  Blood Thinner Instructions: Aspirin Instructions: ASA 81, no instructions, pt will ask Last Dose:  Activity level: Can go up a flight of stairs and perform activities of daily living without stopping and without symptoms of chest pain or shortness of breath. Difficulty with stairs due to hip   Anesthesia review: HTN, CVA, cardiomyopathy, pacemaker, DM2, CHF, kidney failure   Patient denies shortness of breath, fever, cough and chest pain at PAT appointment  Patient verbalized understanding of instructions that were given to them at the PAT appointment. Patient was also instructed that they will need to review over the PAT instructions again at home before surgery.

## 2022-06-12 ENCOUNTER — Encounter (HOSPITAL_COMMUNITY)
Admission: RE | Admit: 2022-06-12 | Discharge: 2022-06-12 | Disposition: A | Discharge status: Home / Self Care | Payer: Medicare Other | Source: Ambulatory Visit | Attending: Orthopaedic Surgery | Admitting: Orthopaedic Surgery

## 2022-06-12 ENCOUNTER — Encounter (HOSPITAL_COMMUNITY): Payer: Self-pay

## 2022-06-12 VITALS — BP 118/65 | HR 87 | Temp 98.2°F | Resp 14 | Ht 61.0 in | Wt 129.0 lb

## 2022-06-12 DIAGNOSIS — Z01818 Encounter for other preprocedural examination: Secondary | ICD-10-CM

## 2022-06-12 DIAGNOSIS — E119 Type 2 diabetes mellitus without complications: Secondary | ICD-10-CM | POA: Diagnosis not present

## 2022-06-12 DIAGNOSIS — I5022 Chronic systolic (congestive) heart failure: Secondary | ICD-10-CM

## 2022-06-12 DIAGNOSIS — Z794 Long term (current) use of insulin: Secondary | ICD-10-CM | POA: Diagnosis not present

## 2022-06-12 DIAGNOSIS — Z01812 Encounter for preprocedural laboratory examination: Secondary | ICD-10-CM | POA: Insufficient documentation

## 2022-06-12 DIAGNOSIS — I119 Hypertensive heart disease without heart failure: Secondary | ICD-10-CM | POA: Diagnosis not present

## 2022-06-12 HISTORY — DX: Unspecified osteoarthritis, unspecified site: M19.90

## 2022-06-12 HISTORY — DX: Cardiac murmur, unspecified: R01.1

## 2022-06-12 HISTORY — DX: Chronic kidney disease, unspecified: N18.9

## 2022-06-12 LAB — CBC
HCT: 36.2 % (ref 36.0–46.0)
Hemoglobin: 11.6 g/dL — ABNORMAL LOW (ref 12.0–15.0)
MCH: 30.4 pg (ref 26.0–34.0)
MCHC: 32 g/dL (ref 30.0–36.0)
MCV: 95 fL (ref 80.0–100.0)
Platelets: 268 10*3/uL (ref 150–400)
RBC: 3.81 MIL/uL — ABNORMAL LOW (ref 3.87–5.11)
RDW: 13.2 % (ref 11.5–15.5)
WBC: 7 10*3/uL (ref 4.0–10.5)
nRBC: 0 % (ref 0.0–0.2)

## 2022-06-12 LAB — BASIC METABOLIC PANEL
Anion gap: 9 (ref 5–15)
BUN: 40 mg/dL — ABNORMAL HIGH (ref 8–23)
CO2: 25 mmol/L (ref 22–32)
Calcium: 9.6 mg/dL (ref 8.9–10.3)
Chloride: 106 mmol/L (ref 98–111)
Creatinine, Ser: 2.54 mg/dL — ABNORMAL HIGH (ref 0.44–1.00)
GFR, Estimated: 20 mL/min — ABNORMAL LOW (ref >60)
Glucose, Bld: 130 mg/dL — ABNORMAL HIGH (ref 70–99)
Potassium: 3.8 mmol/L (ref 3.5–5.1)
Sodium: 140 mmol/L (ref 135–145)

## 2022-06-12 LAB — HEMOGLOBIN A1C
Hgb A1c MFr Bld: 7.3 % — ABNORMAL HIGH (ref 4.8–5.6)
Mean Plasma Glucose: 162.81 mg/dL

## 2022-06-12 LAB — SURGICAL PCR SCREEN
MRSA, PCR: POSITIVE — AB
Staphylococcus aureus: POSITIVE — AB

## 2022-06-12 LAB — GLUCOSE, CAPILLARY: Glucose-Capillary: 153 mg/dL — ABNORMAL HIGH (ref 70–99)

## 2022-06-15 ENCOUNTER — Ambulatory Visit: Payer: Medicare Other | Admitting: Orthopedic Surgery

## 2022-06-15 NOTE — Progress Notes (Signed)
STAPH+ and MRSA+ results routed to Dr. Ninfa Linden

## 2022-06-16 ENCOUNTER — Encounter: Payer: Self-pay | Admitting: Internal Medicine

## 2022-06-16 ENCOUNTER — Ambulatory Visit: Payer: Medicare Other | Attending: Internal Medicine | Admitting: Internal Medicine

## 2022-06-16 VITALS — BP 138/78 | HR 60 | Ht 61.0 in | Wt 134.4 lb

## 2022-06-16 DIAGNOSIS — I5022 Chronic systolic (congestive) heart failure: Secondary | ICD-10-CM | POA: Diagnosis not present

## 2022-06-16 DIAGNOSIS — I255 Ischemic cardiomyopathy: Secondary | ICD-10-CM

## 2022-06-16 NOTE — Patient Instructions (Signed)
Medication Instructions:  Your physician recommends that you continue on your current medications as directed. Please refer to the Current Medication list given to you today.  *If you need a refill on your cardiac medications before your next appointment, please call your pharmacy*   Lab Work: NONE   If you have labs (blood work) drawn today and your tests are completely normal, you will receive your results only by: MyChart Message (if you have MyChart) OR A paper copy in the mail If you have any lab test that is abnormal or we need to change your treatment, we will call you to review the results.   Testing/Procedures: NONE    Follow-Up: At [facility], you and your health needs are our priority.  As part of our continuing mission to provide you with exceptional heart care, we have created designated Provider Care Teams.  These Care Teams include your primary Cardiologist (physician) and Advanced Practice Providers (APPs -  Physician Assistants and Nurse Practitioners) who all work together to provide you with the care you need, when you need it.  We recommend signing up for the patient portal called "MyChart".  Sign up information is provided on this After Visit Summary.  MyChart is used to connect with patients for Virtual Visits (Telemedicine).  Patients are able to view lab/test results, encounter notes, upcoming appointments, etc.  Non-urgent messages can be sent to your provider as well.   To learn more about what you can do with MyChart, go to [email or web address].    Your next appointment:   1 year(s)  The format for your next appointment:   In Person  Provider:   Gregg Taylor, MD    Other Instructions Thank you for choosing [facility]!    Important Information About Sugar       

## 2022-06-16 NOTE — Progress Notes (Signed)
HPI [name] returns today for followup. She is a pleasant 71 yo woman with a h/o chronic systolic heart failure and LBBB s/p biv ICD, down graded to a biv PPM. Her most recent device was placed under her pectoralis major. She has felt well. No chest pain or sob. She remains active working in her yard. She is pending hip replacement surgery.  No Known Allergies   Current Outpatient Medications  Medication Sig Dispense Refill   aspirin EC 81 MG tablet Take 81 mg by mouth daily.     buPROPion (WELLBUTRIN XL) 300 MG 24 hr tablet Take 300 mg by mouth daily.     BYDUREON BCISE 2 MG/0.85ML AUIJ Inject 2 mg into the skin every Sunday.     carvedilol (COREG) 12.5 MG tablet Take 12.5 mg by mouth 2 (two) times daily.     clonazePAM (KLONOPIN) 1 MG tablet Take 1 mg by mouth daily as needed for anxiety.     furosemide (LASIX) 40 MG tablet Take 40 mg by mouth every other day.     HYDROcodone-acetaminophen (NORCO) 10-325 MG tablet Take 1 tablet by mouth 4 (four) times daily as needed for severe pain.     insulin glargine (LANTUS) 100 UNIT/ML injection Inject 6 Units into the skin every morning.     insulin lispro (HUMALOG KWIKPEN) 100 UNIT/ML KwikPen Inject 3 Units into the skin 3 (three) times daily. Per sliding scale     losartan (COZAAR) 25 MG tablet Take 12.5 mg by mouth daily.     meloxicam (MOBIC) 7.5 MG tablet Take 1 tablet (7.5 mg total) by mouth daily. 30 tablet 5   omeprazole (PRILOSEC) 20 MG capsule Take 20 mg by mouth daily.     potassium chloride SA (K-DUR,KLOR-CON) 20 MEQ tablet Take 20 mEq by mouth daily.     Propylene Glycol (SYSTANE BALANCE) 0.6 % SOLN Place 1 drop into both eyes in the morning and at bedtime.     rosuvastatin (CRESTOR) 40 MG tablet TAKE ONE (1) TABLET BY MOUTH EVERY DAY (STOP ATORVASTATIN) 90 tablet 0   sertraline (ZOLOFT) 100 MG tablet Take 200 mg by mouth daily.     tiZANidine (ZANAFLEX) 4 MG tablet Take 1 tablet 3 times a day by oral route as needed. Do  not drink alcohol or drive while taking this medication. May cause drowsiness (Patient taking differently: Take 4 mg by mouth at bedtime. Take 1 tablet 3 times a day by oral route as needed. Do not drink alcohol or drive while taking this medication. May cause drowsiness) 15 tablet 0   zolpidem (AMBIEN) 10 MG tablet Take 10 mg by mouth at bedtime.     No current facility-administered medications for this visit.     Past Medical History:  Diagnosis Date   Anxiety    Arthritis    Cardiomyopathy    Cervical high risk HPV (human papillomavirus) test positive 01/30/2015   CHF (congestive heart failure) (HCC)    Chronic alcohol abuse 06/09/2020   Chronic constipation    Chronic kidney disease    Chronic lower back pain    Constipation 01/30/2015   Depression    GERD (gastroesophageal reflux disease)    Heart failure    Chronic combined   Heart murmur    HTN (hypertension)    Hypercholesteremia    Neurogenic bladder    2008 nerve damage s/p back ssurgery   Presence of permanent cardiac pacemaker    Type II diabetes  mellitus ([place])     ROS:   All systems reviewed and negative except as noted in the HPI.   Past Surgical History:  Procedure Laterality Date   ACHILLES TENDON SURGERY Right 06/14/2012   BACK SURGERY  09/15/2003   BI-VENTRICULAR IMPLANTABLE CARDIOVERTER DEFIBRILLATOR  (CRT-D)  12/18/2013   upgrade of previously implanted dual chamber pacemaker to MDT CRTD with RV lead revision by Dr Placido Sou PACEMAKER UPGRADE  12/18/2013   w/defibrillator   BIV PACEMAKER GENERATOR CHANGE OUT N/A 12/18/2013   Procedure: BIV PACEMAKER GENERATOR CHANGE OUT;  Surgeon: Evans Lance, MD;  [facility] C[facility] LAB;  Service: Cardiovascular;  Laterality: N/A;   BIV PACEMAKER INSERTION CRT-P N/A 02/22/2020   Procedure: DOWNGRADE BIV PACEMAKER INSERTION CRT-P;  Surgeon: Evans Lance, MD;  Location: [place] CV LAB;  Service: Cardiovascular;  Laterality: N/A;   BREAST  BIOPSY Left 09/15/1971   benign   BREAST LUMPECTOMY Left 09/15/1971   CATARACT EXTRACTION Bilateral    CESAREAN SECTION  09/15/1979   CHOLECYSTECTOMY     GASTROCNEMIUS RECESSION  06/24/2012   Procedure: GASTROCNEMIUS SLIDE;  Surgeon: Colin Rhein, MD;  Location: [facility];  Service: Orthopedics;  Laterality: Right;   HERNIA REPAIR     LACERATION REPAIR Left 02/17/2022   Procedure: REPAIR LEFT CHEEK COMPLEX LACERATION WITH MYRIAD PLACEMENT;  Surgeon: Wallace Going, DO;  Location: [place];  Service: Plastics;  Laterality: Left;   LEAD REVISION N/A 12/18/2013   Procedure: LEAD REVISION;  Surgeon: Evans Lance, MD;  Location: [facility] CATH LAB;  Service: Cardiovascular;  Laterality: N/A;   PACEMAKER INSERTION  09/14/2004   Dual chamber MDT pacemaker implanted - subsequent upgrade to CRTD 12-2013   POSTERIOR LUMBAR FUSION  2006; 2008   SIGMOIDOSCOPY  MAY 2011 w/ PROP   iTCS-->FSIG-poor bowel prep   SKIN FULL THICKNESS GRAFT Left 04/06/2022   Procedure: SKIN GRAFT FULL THICKNESS;  Surgeon: Wallace Going, DO;  Location: [place];  Service: Plastics;  Laterality: Left;   TUBAL LIGATION  ~ 1984   UPPER GASTROINTESTINAL ENDOSCOPY  MAY 2011 w/ PROP   MILD GASTRITIS 2o ETOH   WISDOM TOOTH EXTRACTION       Family History  Problem Relation Age of Onset   Alzheimer's disease Mother    Diabetes Mother    Cancer Father    Cancer Sister        breast   Diabetes Sister    Diabetes Brother    Brain cancer Brother    Diabetes Brother    Colon cancer Neg Hx    Colon polyps Neg Hx      Social History   Socioeconomic History   Marital status: Married    Spouse name: Not on file   Number of children: Not on file   Years of education: Not on file   Highest education level: Not on file  Occupational History   Not on file  Tobacco Use   Smoking status: Never   Smokeless tobacco: Never  Vaping Use   Vaping Use: Never used  Substance and Sexual Activity   Alcohol  use: Yes    Alcohol/week: 6.0 standard drinks of alcohol    Types: 6 Cans of beer per week   Drug use: No   Sexual activity: Not Currently    Birth control/protection: Post-menopausal  Other Topics Concern   Not on file  Social History Narrative   LOST ONLY SON IN AN MVA 2001-->DISABLED  FROM DEPRESSION   Social Determinants of Health   Financial Resource Strain: Not on file  Food Insecurity: No Food Insecurity (05/05/2022)   Hunger Vital Sign    Worried About Running Out of Food in the Last Year: Never true    Ran Out of Food in the Last Year: Never true  Transportation Needs: No Transportation Needs (05/05/2022)   PRAPARE - Hydrologist (Medical): No    Lack of Transportation (Non-Medical): No  Physical Activity: Not on file  Stress: Not on file  Social Connections: Not on file  Intimate Partner Violence: Not on file     BP 138/78   Pulse 60   Ht '5\' 1"'  (1.549 m)   Wt 134 lb 6.4 oz (61 kg)   SpO2 99%   BMI 25.39 kg/m   Physical Exam:  Well appearing NAD HEENT: Unremarkable Neck:  No JVD, no thyromegally Lymphatics:  No adenopathy Back:  No CVA tenderness Lungs:  Clear HEART:  Regular rate rhythm, no murmurs, no rubs, no clicks Abd:  soft, positive bowel sounds, no organomegally, no rebound, no guarding Ext:  2 plus pulses, no edema, no cyanosis, no clubbing Skin:  No rashes no nodules Neuro:  CN II through XII intact, motor grossly intact  DEVICE  Normal device function.  See PaceArt for details.   Assess/Plan:  1. Chronic systolic heart failure - she is doing well and her EF has improved. Her symptoms are class 1.  2. 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 3. Preop evaluation - she is an acceptable surgical risk. She may hold ASA. Restart post op when bleeding risk is acceptable.  Royston Sinner Victoriah Wilds,MD

## 2022-06-17 ENCOUNTER — Telehealth: Payer: Self-pay | Admitting: *Deleted

## 2022-06-17 NOTE — Care Plan (Signed)
[facility] RNCM call to patient to discuss her upcoming Left total hip arthroplasty with Dr. Ninfa Linden on 06/18/22. She is an Ortho bundle patient through [facility] and is agreeable to case management. She lives alone, but has a friend that will be in and out helping her after discharge. She has a standard walker, but will need a RW (most likely Jr.-Height 5'1") as well as a 3in1/BSC. Will order these through [facility] to be delivered to her hospital room prior to discharge. Anticipate HHPT will be needed after a short hospital stay. Referral made to [facility] after choice provided. Reviewed all post op care information. Will continue to follow for needs.

## 2022-06-17 NOTE — Progress Notes (Signed)
Anesthesia Chart Review   Case: [id number] Date/Time: 06/18/22 0715   Procedure: LEFT TOTAL HIP ARTHROPLASTY ANTERIOR APPROACH (Left: Hip)   Anesthesia type: Choice   Pre-op diagnosis: osteoarthritis left hip   [facility] 10 / [facility]   Surgeons: Mcarthur Rossetti, MD       DISCUSSION:71 y.o. never smoker with h/o HTN, DM II, CHF, LBBB, pacemaker in place (device orders in 06/11/2022 progress note), CKD, left hip OA scheduled for above procedure 06/18/2022 with Dr. Jean Rosenthal.   Pt last seen by cardiology 06/16/2022. Per OV note, "1. Chronic systolic heart failure - she is doing well and her EF has improved. Her symptoms are class 1.  2. 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 3. Preop evaluation - she is an acceptable surgical risk. She may hold ASA. Restart post op when bleeding risk is acceptable."  Anticipate pt can proceed with planned procedure barring acute status change.   VS: BP 118/65   Pulse 87   Temp 36.8 C (Oral)   Resp 14   Ht '5\' 1"'  (1.549 m)   Wt 58.5 kg   SpO2 100%   BMI 24.37 kg/m   PROVIDERS: Celene Squibb, MD is PCP   Cristopher Peru, MD is Cardiologist  LABS: Labs reviewed: Acceptable for surgery. (all labs ordered are listed, but only abnormal results are displayed)  Labs Reviewed  SURGICAL PCR SCREEN - Abnormal; Notable for the following components:      Result Value   MRSA, PCR POSITIVE (*)    Staphylococcus aureus POSITIVE (*)    All other components within normal limits  HEMOGLOBIN A1C - Abnormal; Notable for the following components:   Hgb A1c MFr Bld 7.3 (*)    All other components within normal limits  BASIC METABOLIC PANEL - Abnormal; Notable for the following components:   Glucose, Bld 130 (*)    BUN 40 (*)    Creatinine, Ser 2.54 (*)    GFR, Estimated 20 (*)    All other components within normal limits  CBC - Abnormal; Notable for the following components:   RBC 3.81 (*)     Hemoglobin 11.6 (*)    All other components within normal limits  GLUCOSE, CAPILLARY - Abnormal; Notable for the following components:   Glucose-Capillary 153 (*)    All other components within normal limits  TYPE AND SCREEN     IMAGES:   EKG:   CV: Echo 02/18/2022 1. Left ventricular ejection fraction, by estimation, is 55 to 60%. The  left ventricle has normal function. The left ventricle has no regional  wall motion abnormalities. There is mild concentric left ventricular  hypertrophy. Left ventricular diastolic  parameters are consistent with Grade I diastolic dysfunction (impaired  relaxation).   2. Right ventricular systolic function is normal. The right ventricular  size is normal. There is normal pulmonary artery systolic pressure. The  estimated right ventricular systolic pressure is 81.4 mmHg.   3. The mitral valve is normal in structure. Trivial mitral valve  regurgitation.   4. Tricuspid valve regurgitation is mild to moderate.   5. The aortic valve is tricuspid. Aortic valve regurgitation is not  visualized. Aortic valve sclerosis is present, with no evidence of aortic  valve stenosis.   6. The inferior vena cava is normal in size with <50% respiratory  variability, suggesting right atrial pressure of 8 mmHg.  Past Medical History:  Diagnosis Date  Anxiety    Arthritis    Cardiomyopathy    Cervical high risk HPV (human papillomavirus) test positive 01/30/2015   CHF (congestive heart failure) (HCC)    Chronic alcohol abuse 06/09/2020   Chronic constipation    Chronic kidney disease    Chronic lower back pain    Constipation 01/30/2015   Depression    GERD (gastroesophageal reflux disease)    Heart failure    Chronic combined   Heart murmur    HTN (hypertension)    Hypercholesteremia    Neurogenic bladder    2008 nerve damage s/p back ssurgery   Presence of permanent cardiac pacemaker    Type II diabetes mellitus [facility])     Past Surgical History:   Procedure Laterality Date   ACHILLES TENDON SURGERY Right 06/14/2012   BACK SURGERY  09/15/2003   BI-VENTRICULAR IMPLANTABLE CARDIOVERTER DEFIBRILLATOR  (CRT-D)  12/18/2013   upgrade of previously implanted dual chamber pacemaker to MDT CRTD with RV lead revision by Dr Placido Sou PACEMAKER UPGRADE  12/18/2013   w/defibrillator   BIV PACEMAKER GENERATOR CHANGE OUT N/A 12/18/2013   Procedure: BIV PACEMAKER GENERATOR CHANGE OUT;  Surgeon: Evans Lance, MD;  [facility] CATH LAB;  Service: Cardiovascular;  Laterality: N/A;   BIV PACEMAKER INSERTION CRT-P N/A 02/22/2020   Procedure: DOWNGRADE BIV PACEMAKER INSERTION CRT-P;  Surgeon: Evans Lance, MD;  Location: [place] CV LAB;  Service: Cardiovascular;  Laterality: N/A;   BREAST BIOPSY Left 09/15/1971   benign   BREAST LUMPECTOMY Left 09/15/1971   CATARACT EXTRACTION Bilateral    CESAREAN SECTION  09/15/1979   CHOLECYSTECTOMY     GASTROCNEMIUS RECESSION  06/24/2012   Procedure: GASTROCNEMIUS SLIDE;  Surgeon: Colin Rhein, MD;  [facility];  Service: Orthopedics;  Laterality: Right;   HERNIA REPAIR     LACERATION REPAIR Left 02/17/2022   Procedure: REPAIR LEFT CHEEK COMPLEX LACERATION WITH MYRIAD PLACEMENT;  Surgeon: Wallace Going, DO;  Location: [place];  Service: Plastics;  Laterality: Left;   LEAD REVISION N/A 12/18/2013   Procedure: LEAD REVISION;  Surgeon: Evans Lance, MD;  Location: [facility] CATH LAB;  Service: Cardiovascular;  Laterality: N/A;   PACEMAKER INSERTION  09/14/2004   Dual chamber MDT pacemaker implanted - subsequent upgrade to CRTD 12-2013   POSTERIOR LUMBAR FUSION  2006; 2008   SIGMOIDOSCOPY  MAY 2011 w/ PROP   iTCS-->FSIG-poor bowel prep   SKIN FULL THICKNESS GRAFT Left 04/06/2022   Procedure: SKIN GRAFT FULL THICKNESS;  Surgeon: Wallace Going, DO;  Location: [place];  Service: Plastics;  Laterality: Left;   TUBAL LIGATION  ~ 1984   UPPER GASTROINTESTINAL  ENDOSCOPY  MAY 2011 w/ PROP   MILD GASTRITIS 2o ETOH   WISDOM TOOTH EXTRACTION      MEDICATIONS:  aspirin EC 81 MG tablet   buPROPion (WELLBUTRIN XL) 300 MG 24 hr tablet   BYDUREON BCISE 2 MG/0.85ML AUIJ   carvedilol (COREG) 12.5 MG tablet   clonazePAM (KLONOPIN) 1 MG tablet   furosemide (LASIX) 40 MG tablet   HYDROcodone-acetaminophen (NORCO) 10-325 MG tablet   insulin glargine (LANTUS) 100 UNIT/ML injection   insulin lispro (HUMALOG KWIKPEN) 100 UNIT/ML KwikPen   losartan (COZAAR) 25 MG tablet   meloxicam (MOBIC) 7.5 MG tablet   omeprazole (PRILOSEC) 20 MG capsule   potassium chloride SA (K-DUR,KLOR-CON) 20 MEQ tablet   Propylene Glycol (SYSTANE BALANCE) 0.6 % SOLN   rosuvastatin (CRESTOR) 40 MG tablet  sertraline (ZOLOFT) 100 MG tablet   tiZANidine (ZANAFLEX) 4 MG tablet   zolpidem (AMBIEN) 10 MG tablet   No current facility-administered medications for this encounter.    Konrad Felix Ward, PA-C [facility] [phone]

## 2022-06-17 NOTE — Anesthesia Preprocedure Evaluation (Signed)
Anesthesia Evaluation  Patient identified by MRN, date of birth, ID band Patient awake    Reviewed: Allergy & Precautions, NPO status , Patient's Chart, lab work & pertinent test results  History of Anesthesia Complications Negative for: history of anesthetic complications  Airway Mallampati: II  TM Distance: >3 FB Neck ROM: Full    Dental  (+) Dental Advisory Given, Teeth Intact   Pulmonary neg pulmonary ROS,    breath sounds clear to auscultation       Cardiovascular hypertension, Pt. on medications and Pt. on home beta blockers + pacemaker  Rhythm:Regular Rate:Normal  02/2022 ECHO:  EF 55-60%, normal LVF, mild LVH, Grade 1 DD, normal RVF, mild-mod TR   Neuro/Psych Anxiety Depression negative neurological ROS     GI/Hepatic GERD  Medicated and Controlled,(+)     substance abuse  alcohol use,   Endo/Other  diabetes, Insulin Dependent  Renal/GU Renal InsufficiencyRenal disease     Musculoskeletal  (+) Arthritis ,   Abdominal   Peds  Hematology negative hematology ROS (+)   Anesthesia Other Findings   Reproductive/Obstetrics                            Anesthesia Physical Anesthesia Plan  ASA: 3  Anesthesia Plan: Spinal   Post-op Pain Management: Tylenol PO (pre-op)*   Induction:   PONV Risk Score and Plan: 2 and Ondansetron, Dexamethasone and Treatment may vary due to age or medical condition  Airway Management Planned: Natural Airway and Simple Face Mask  Additional Equipment: None  Intra-op Plan:   Post-operative Plan:   Informed Consent: I have reviewed the patients History and Physical, chart, labs and discussed the procedure including the risks, benefits and alternatives for the proposed anesthesia with the patient or authorized representative who has indicated his/her understanding and acceptance.     Dental advisory given  Plan Discussed with: CRNA and  Surgeon  Anesthesia Plan Comments:        Anesthesia Quick Evaluation

## 2022-06-17 NOTE — Telephone Encounter (Signed)
Ortho bundle Pre-op call completed. 

## 2022-06-18 ENCOUNTER — Observation Stay (HOSPITAL_COMMUNITY): Payer: Medicare Other

## 2022-06-18 ENCOUNTER — Ambulatory Visit (HOSPITAL_COMMUNITY): Payer: Medicare Other

## 2022-06-18 ENCOUNTER — Encounter (HOSPITAL_COMMUNITY): Payer: Self-pay | Admitting: Orthopaedic Surgery

## 2022-06-18 ENCOUNTER — Other Ambulatory Visit: Payer: Self-pay

## 2022-06-18 ENCOUNTER — Encounter (HOSPITAL_COMMUNITY)
Admission: RE | Disposition: A | Discharge status: Home Health | Payer: Self-pay | Source: Ambulatory Visit | Attending: Orthopaedic Surgery

## 2022-06-18 ENCOUNTER — Ambulatory Visit (HOSPITAL_COMMUNITY): Payer: Medicare Other | Admitting: Physician Assistant

## 2022-06-18 ENCOUNTER — Ambulatory Visit (HOSPITAL_BASED_OUTPATIENT_CLINIC_OR_DEPARTMENT_OTHER): Payer: Medicare Other | Admitting: Anesthesiology

## 2022-06-18 ENCOUNTER — Inpatient Hospital Stay (HOSPITAL_COMMUNITY)
Admission: RE | Admit: 2022-06-18 | Discharge: 2022-06-21 | DRG: 470 | Disposition: A | Discharge status: Home Health | Payer: Medicare Other | Source: Ambulatory Visit | Attending: Orthopaedic Surgery | Admitting: Orthopaedic Surgery

## 2022-06-18 DIAGNOSIS — M2170 Unequal limb length (acquired), unspecified site: Secondary | ICD-10-CM | POA: Diagnosis present

## 2022-06-18 DIAGNOSIS — M1612 Unilateral primary osteoarthritis, left hip: Principal | ICD-10-CM | POA: Diagnosis present

## 2022-06-18 DIAGNOSIS — I119 Hypertensive heart disease without heart failure: Secondary | ICD-10-CM

## 2022-06-18 DIAGNOSIS — Z96642 Presence of left artificial hip joint: Secondary | ICD-10-CM

## 2022-06-18 DIAGNOSIS — E1122 Type 2 diabetes mellitus with diabetic chronic kidney disease: Secondary | ICD-10-CM | POA: Diagnosis present

## 2022-06-18 DIAGNOSIS — I426 Alcoholic cardiomyopathy: Secondary | ICD-10-CM | POA: Diagnosis present

## 2022-06-18 DIAGNOSIS — I5042 Chronic combined systolic (congestive) and diastolic (congestive) heart failure: Secondary | ICD-10-CM | POA: Diagnosis present

## 2022-06-18 DIAGNOSIS — R296 Repeated falls: Secondary | ICD-10-CM | POA: Diagnosis present

## 2022-06-18 DIAGNOSIS — E119 Type 2 diabetes mellitus without complications: Secondary | ICD-10-CM | POA: Diagnosis not present

## 2022-06-18 DIAGNOSIS — Z794 Long term (current) use of insulin: Secondary | ICD-10-CM

## 2022-06-18 DIAGNOSIS — Z833 Family history of diabetes mellitus: Secondary | ICD-10-CM

## 2022-06-18 DIAGNOSIS — Z01818 Encounter for other preprocedural examination: Principal | ICD-10-CM

## 2022-06-18 DIAGNOSIS — F418 Other specified anxiety disorders: Secondary | ICD-10-CM

## 2022-06-18 DIAGNOSIS — Z95810 Presence of automatic (implantable) cardiac defibrillator: Secondary | ICD-10-CM

## 2022-06-18 DIAGNOSIS — Z808 Family history of malignant neoplasm of other organs or systems: Secondary | ICD-10-CM

## 2022-06-18 DIAGNOSIS — Z8619 Personal history of other infectious and parasitic diseases: Secondary | ICD-10-CM

## 2022-06-18 DIAGNOSIS — F1010 Alcohol abuse, uncomplicated: Secondary | ICD-10-CM | POA: Diagnosis present

## 2022-06-18 DIAGNOSIS — E1140 Type 2 diabetes mellitus with diabetic neuropathy, unspecified: Secondary | ICD-10-CM | POA: Diagnosis present

## 2022-06-18 DIAGNOSIS — I10 Essential (primary) hypertension: Secondary | ICD-10-CM | POA: Diagnosis not present

## 2022-06-18 DIAGNOSIS — F32A Depression, unspecified: Secondary | ICD-10-CM | POA: Diagnosis present

## 2022-06-18 DIAGNOSIS — E782 Mixed hyperlipidemia: Secondary | ICD-10-CM | POA: Diagnosis present

## 2022-06-18 DIAGNOSIS — D631 Anemia in chronic kidney disease: Secondary | ICD-10-CM | POA: Diagnosis present

## 2022-06-18 DIAGNOSIS — Z820 Family history of epilepsy and other diseases of the nervous system: Secondary | ICD-10-CM

## 2022-06-18 DIAGNOSIS — Z8673 Personal history of transient ischemic attack (TIA), and cerebral infarction without residual deficits: Secondary | ICD-10-CM

## 2022-06-18 DIAGNOSIS — I130 Hypertensive heart and chronic kidney disease with heart failure and stage 1 through stage 4 chronic kidney disease, or unspecified chronic kidney disease: Secondary | ICD-10-CM | POA: Diagnosis present

## 2022-06-18 DIAGNOSIS — F419 Anxiety disorder, unspecified: Secondary | ICD-10-CM | POA: Diagnosis present

## 2022-06-18 DIAGNOSIS — N319 Neuromuscular dysfunction of bladder, unspecified: Secondary | ICD-10-CM | POA: Diagnosis present

## 2022-06-18 DIAGNOSIS — K219 Gastro-esophageal reflux disease without esophagitis: Secondary | ICD-10-CM | POA: Diagnosis present

## 2022-06-18 DIAGNOSIS — N1832 Chronic kidney disease, stage 3b: Secondary | ICD-10-CM | POA: Diagnosis present

## 2022-06-18 DIAGNOSIS — Z981 Arthrodesis status: Secondary | ICD-10-CM

## 2022-06-18 HISTORY — PX: TOTAL HIP ARTHROPLASTY: SHX124

## 2022-06-18 LAB — TYPE AND SCREEN
ABO/RH(D): A POS
Antibody Screen: NEGATIVE

## 2022-06-18 LAB — GLUCOSE, CAPILLARY
Glucose-Capillary: 188 mg/dL — ABNORMAL HIGH (ref 70–99)
Glucose-Capillary: 199 mg/dL — ABNORMAL HIGH (ref 70–99)
Glucose-Capillary: 324 mg/dL — ABNORMAL HIGH (ref 70–99)
Glucose-Capillary: 209 mg/dL — ABNORMAL HIGH (ref 70–99)

## 2022-06-18 SURGERY — ARTHROPLASTY, HIP, TOTAL, ANTERIOR APPROACH
Anesthesia: Spinal | Site: Hip | Laterality: Left

## 2022-06-18 MED ORDER — CEFAZOLIN SODIUM-DEXTROSE 2-4 GM/100ML-% IV SOLN
2.0000 g | INTRAVENOUS | Status: AC
  Administered 2022-06-18: 2 g via INTRAVENOUS
  Filled 2022-06-18: qty 100

## 2022-06-18 MED ORDER — OXYCODONE HCL 5 MG/5ML PO SOLN: 5.0000 mg | Freq: Once | ORAL | Status: DC | PRN

## 2022-06-18 MED ORDER — ORAL CARE MOUTH RINSE: 15.0000 mL | Freq: Once | OROMUCOSAL | Status: AC

## 2022-06-18 MED ORDER — SERTRALINE HCL 100 MG PO TABS
200.0000 mg | ORAL_TABLET | Freq: Every day | ORAL | Status: DC
  Administered 2022-06-19 – 2022-06-21 (×3): 200 mg via ORAL
  Filled 2022-06-18 (×3): qty 2

## 2022-06-18 MED ORDER — INSULIN LISPRO (1 UNIT DIAL) 100 UNIT/ML (KWIKPEN): 3.0000 [IU] | PEN_INJECTOR | Freq: Three times a day (TID) | SUBCUTANEOUS | Status: DC

## 2022-06-18 MED ORDER — ONDANSETRON HCL 4 MG/2ML IJ SOLN
INTRAMUSCULAR | Status: AC
  Filled 2022-06-18: qty 2

## 2022-06-18 MED ORDER — ONDANSETRON HCL 4 MG/2ML IJ SOLN: 4.0000 mg | Freq: Four times a day (QID) | INTRAMUSCULAR | Status: DC | PRN

## 2022-06-18 MED ORDER — METOCLOPRAMIDE HCL 5 MG PO TABS: 5.0000 mg | ORAL_TABLET | Freq: Three times a day (TID) | ORAL | Status: DC | PRN

## 2022-06-18 MED ORDER — PANTOPRAZOLE SODIUM 40 MG PO TBEC
40.0000 mg | DELAYED_RELEASE_TABLET | Freq: Every day | ORAL | Status: DC
  Administered 2022-06-19 – 2022-06-21 (×3): 40 mg via ORAL
  Filled 2022-06-18 (×3): qty 1

## 2022-06-18 MED ORDER — POTASSIUM CHLORIDE CRYS ER 20 MEQ PO TBCR
20.0000 meq | EXTENDED_RELEASE_TABLET | Freq: Every day | ORAL | Status: DC
  Administered 2022-06-18 – 2022-06-21 (×4): 20 meq via ORAL
  Filled 2022-06-18 (×4): qty 1

## 2022-06-18 MED ORDER — MEPERIDINE HCL 50 MG/ML IJ SOLN: 6.2500 mg | INTRAMUSCULAR | Status: DC | PRN

## 2022-06-18 MED ORDER — CARVEDILOL 12.5 MG PO TABS
12.5000 mg | ORAL_TABLET | Freq: Two times a day (BID) | ORAL | Status: DC
  Administered 2022-06-18 – 2022-06-20 (×4): 12.5 mg via ORAL
  Filled 2022-06-18 (×5): qty 1

## 2022-06-18 MED ORDER — FENTANYL CITRATE (PF) 100 MCG/2ML IJ SOLN
INTRAMUSCULAR | Status: DC | PRN
  Administered 2022-06-18 (×2): 25 ug via INTRAVENOUS
  Administered 2022-06-18: 50 ug via INTRAVENOUS

## 2022-06-18 MED ORDER — INSULIN ASPART 100 UNIT/ML IJ SOLN
0.0000 [IU] | Freq: Three times a day (TID) | INTRAMUSCULAR | Status: DC
  Administered 2022-06-18: 7 [IU] via SUBCUTANEOUS
  Administered 2022-06-19: 1 [IU] via SUBCUTANEOUS
  Administered 2022-06-19 – 2022-06-20 (×4): 2 [IU] via SUBCUTANEOUS
  Administered 2022-06-20 – 2022-06-21 (×2): 1 [IU] via SUBCUTANEOUS

## 2022-06-18 MED ORDER — MIDAZOLAM HCL 5 MG/5ML IJ SOLN
INTRAMUSCULAR | Status: DC | PRN
  Administered 2022-06-18: 2 mg via INTRAVENOUS

## 2022-06-18 MED ORDER — FUROSEMIDE 40 MG PO TABS
40.0000 mg | ORAL_TABLET | ORAL | Status: DC
  Administered 2022-06-19: 40 mg via ORAL
  Filled 2022-06-18: qty 1

## 2022-06-18 MED ORDER — HYDROMORPHONE HCL 1 MG/ML IJ SOLN
0.5000 mg | INTRAMUSCULAR | Status: DC | PRN
  Administered 2022-06-18 – 2022-06-20 (×3): 1 mg via INTRAVENOUS
  Filled 2022-06-18 (×3): qty 1

## 2022-06-18 MED ORDER — TRANEXAMIC ACID-NACL 1000-0.7 MG/100ML-% IV SOLN
1000.0000 mg | INTRAVENOUS | Status: AC
  Administered 2022-06-18: 1000 mg via INTRAVENOUS
  Filled 2022-06-18: qty 100

## 2022-06-18 MED ORDER — LOSARTAN POTASSIUM 25 MG PO TABS
12.5000 mg | ORAL_TABLET | Freq: Every day | ORAL | Status: DC
  Administered 2022-06-19: 12.5 mg via ORAL
  Filled 2022-06-18 (×3): qty 1

## 2022-06-18 MED ORDER — 0.9 % SODIUM CHLORIDE (POUR BTL) OPTIME
TOPICAL | Status: DC | PRN
  Administered 2022-06-18: 1000 mL

## 2022-06-18 MED ORDER — DOCUSATE SODIUM 100 MG PO CAPS
100.0000 mg | ORAL_CAPSULE | Freq: Two times a day (BID) | ORAL | Status: DC
  Administered 2022-06-18 – 2022-06-21 (×6): 100 mg via ORAL
  Filled 2022-06-18 (×6): qty 1

## 2022-06-18 MED ORDER — FENTANYL CITRATE (PF) 100 MCG/2ML IJ SOLN
INTRAMUSCULAR | Status: AC
  Filled 2022-06-18: qty 2

## 2022-06-18 MED ORDER — ONDANSETRON HCL 4 MG/2ML IJ SOLN
INTRAMUSCULAR | Status: DC | PRN
  Administered 2022-06-18: 4 mg via INTRAVENOUS

## 2022-06-18 MED ORDER — MIDAZOLAM HCL 2 MG/2ML IJ SOLN
INTRAMUSCULAR | Status: AC
  Filled 2022-06-18: qty 2

## 2022-06-18 MED ORDER — CLONAZEPAM 1 MG PO TABS
1.0000 mg | ORAL_TABLET | Freq: Every day | ORAL | Status: DC | PRN
  Administered 2022-06-18 – 2022-06-19 (×2): 1 mg via ORAL
  Filled 2022-06-18 (×2): qty 1

## 2022-06-18 MED ORDER — PROPOFOL 1000 MG/100ML IV EMUL
INTRAVENOUS | Status: AC
  Filled 2022-06-18: qty 100

## 2022-06-18 MED ORDER — SODIUM CHLORIDE 0.9 % IR SOLN
Status: DC | PRN
  Administered 2022-06-18: 1000 mL

## 2022-06-18 MED ORDER — METHOCARBAMOL 500 MG IVPB - SIMPLE MED
INTRAVENOUS | Status: AC
  Filled 2022-06-18: qty 55

## 2022-06-18 MED ORDER — STERILE WATER FOR IRRIGATION IR SOLN
Status: DC | PRN
  Administered 2022-06-18: 2000 mL

## 2022-06-18 MED ORDER — MIDAZOLAM HCL 2 MG/2ML IJ SOLN: 0.5000 mg | Freq: Once | INTRAMUSCULAR | Status: AC | PRN

## 2022-06-18 MED ORDER — DEXAMETHASONE SODIUM PHOSPHATE 10 MG/ML IJ SOLN
INTRAMUSCULAR | Status: DC | PRN
  Administered 2022-06-18: 10 mg via INTRAVENOUS

## 2022-06-18 MED ORDER — METHOCARBAMOL 500 MG IVPB - SIMPLE MED
500.0000 mg | Freq: Four times a day (QID) | INTRAVENOUS | Status: DC | PRN
  Administered 2022-06-18: 500 mg via INTRAVENOUS

## 2022-06-18 MED ORDER — POLYETHYLENE GLYCOL 3350 17 G PO PACK: 17.0000 g | PACK | Freq: Every day | ORAL | Status: DC | PRN

## 2022-06-18 MED ORDER — LACTATED RINGERS IV SOLN: INTRAVENOUS | Status: DC

## 2022-06-18 MED ORDER — BUPIVACAINE IN DEXTROSE 0.75-8.25 % IT SOLN
INTRATHECAL | Status: DC | PRN
  Administered 2022-06-18: 1.6 mL via INTRATHECAL

## 2022-06-18 MED ORDER — HYDROMORPHONE HCL 1 MG/ML IJ SOLN
0.2500 mg | INTRAMUSCULAR | Status: AC | PRN
  Administered 2022-06-18 (×6): 0.5 mg via INTRAVENOUS

## 2022-06-18 MED ORDER — MIDAZOLAM HCL 2 MG/2ML IJ SOLN
INTRAMUSCULAR | Status: AC
  Administered 2022-06-18: 2 mg via INTRAVENOUS
  Filled 2022-06-18: qty 2

## 2022-06-18 MED ORDER — ONDANSETRON HCL 4 MG PO TABS: 4.0000 mg | ORAL_TABLET | Freq: Four times a day (QID) | ORAL | Status: DC | PRN

## 2022-06-18 MED ORDER — PHENOL 1.4 % MT LIQD: 1.0000 | OROMUCOSAL | Status: DC | PRN

## 2022-06-18 MED ORDER — HYDROMORPHONE HCL 2 MG PO TABS
2.0000 mg | ORAL_TABLET | ORAL | Status: DC | PRN
  Administered 2022-06-18 (×2): 3 mg via ORAL
  Administered 2022-06-19 – 2022-06-21 (×4): 2 mg via ORAL
  Filled 2022-06-18: qty 1
  Filled 2022-06-18 (×2): qty 2
  Filled 2022-06-18 (×3): qty 1

## 2022-06-18 MED ORDER — OXYCODONE HCL 5 MG PO TABS: 5.0000 mg | ORAL_TABLET | Freq: Once | ORAL | Status: DC | PRN

## 2022-06-18 MED ORDER — DEXAMETHASONE SODIUM PHOSPHATE 10 MG/ML IJ SOLN
INTRAMUSCULAR | Status: AC
  Filled 2022-06-18: qty 1

## 2022-06-18 MED ORDER — ROSUVASTATIN CALCIUM 20 MG PO TABS
40.0000 mg | ORAL_TABLET | Freq: Every day | ORAL | Status: DC
  Administered 2022-06-19 – 2022-06-21 (×3): 40 mg via ORAL
  Filled 2022-06-18 (×3): qty 2

## 2022-06-18 MED ORDER — METHOCARBAMOL 500 MG PO TABS
500.0000 mg | ORAL_TABLET | Freq: Four times a day (QID) | ORAL | Status: DC | PRN
  Administered 2022-06-18 – 2022-06-21 (×6): 500 mg via ORAL
  Filled 2022-06-18 (×7): qty 1

## 2022-06-18 MED ORDER — CEFAZOLIN SODIUM-DEXTROSE 1-4 GM/50ML-% IV SOLN
1.0000 g | Freq: Four times a day (QID) | INTRAVENOUS | Status: AC
  Administered 2022-06-18 (×2): 1 g via INTRAVENOUS
  Filled 2022-06-18 (×2): qty 50

## 2022-06-18 MED ORDER — ALUM & MAG HYDROXIDE-SIMETH 200-200-20 MG/5ML PO SUSP: 30.0000 mL | ORAL | Status: DC | PRN

## 2022-06-18 MED ORDER — MEPIVACAINE HCL (PF) 2 % IJ SOLN
INTRAMUSCULAR | Status: AC
  Filled 2022-06-18: qty 20

## 2022-06-18 MED ORDER — OXYCODONE HCL 5 MG PO TABS
5.0000 mg | ORAL_TABLET | ORAL | Status: DC | PRN
  Administered 2022-06-18 – 2022-06-20 (×10): 10 mg via ORAL
  Administered 2022-06-21: 5 mg via ORAL
  Administered 2022-06-21: 10 mg via ORAL
  Administered 2022-06-21: 5 mg via ORAL
  Filled 2022-06-18: qty 2
  Filled 2022-06-18: qty 1
  Filled 2022-06-18 (×4): qty 2
  Filled 2022-06-18: qty 1
  Filled 2022-06-18 (×6): qty 2

## 2022-06-18 MED ORDER — HYDROMORPHONE HCL 1 MG/ML IJ SOLN
INTRAMUSCULAR | Status: AC
  Administered 2022-06-18: 0.5 mg via INTRAVENOUS
  Filled 2022-06-18: qty 1

## 2022-06-18 MED ORDER — BUPROPION HCL ER (XL) 300 MG PO TB24
300.0000 mg | ORAL_TABLET | Freq: Every day | ORAL | Status: DC
  Administered 2022-06-19 – 2022-06-21 (×3): 300 mg via ORAL
  Filled 2022-06-18 (×3): qty 1

## 2022-06-18 MED ORDER — INSULIN GLARGINE-YFGN 100 UNIT/ML ~~LOC~~ SOLN
3.0000 [IU] | Freq: Every day | SUBCUTANEOUS | Status: DC
  Administered 2022-06-19 – 2022-06-21 (×3): 3 [IU] via SUBCUTANEOUS
  Filled 2022-06-18 (×3): qty 0.03

## 2022-06-18 MED ORDER — POVIDONE-IODINE 10 % EX SWAB
2.0000 | Freq: Once | CUTANEOUS | Status: AC
  Administered 2022-06-18: 2 via TOPICAL

## 2022-06-18 MED ORDER — ACETAMINOPHEN 325 MG PO TABS
325.0000 mg | ORAL_TABLET | Freq: Four times a day (QID) | ORAL | Status: DC | PRN
  Administered 2022-06-19 – 2022-06-20 (×4): 650 mg via ORAL
  Filled 2022-06-18 (×4): qty 2

## 2022-06-18 MED ORDER — MENTHOL 3 MG MT LOZG: 1.0000 | LOZENGE | OROMUCOSAL | Status: DC | PRN

## 2022-06-18 MED ORDER — PROPOFOL 10 MG/ML IV BOLUS
INTRAVENOUS | Status: DC | PRN
  Administered 2022-06-18 (×3): 20 mg via INTRAVENOUS

## 2022-06-18 MED ORDER — DIPHENHYDRAMINE HCL 12.5 MG/5ML PO ELIX
12.5000 mg | ORAL_SOLUTION | ORAL | Status: DC | PRN
  Administered 2022-06-18: 25 mg via ORAL
  Filled 2022-06-18: qty 10

## 2022-06-18 MED ORDER — INSULIN ASPART 100 UNIT/ML IJ SOLN
0.0000 [IU] | Freq: Every day | INTRAMUSCULAR | Status: DC
  Administered 2022-06-18: 2 [IU] via SUBCUTANEOUS

## 2022-06-18 MED ORDER — PROPOFOL 500 MG/50ML IV EMUL
INTRAVENOUS | Status: DC | PRN
  Administered 2022-06-18: 100 ug/kg/min via INTRAVENOUS

## 2022-06-18 MED ORDER — VANCOMYCIN HCL IN DEXTROSE 1-5 GM/200ML-% IV SOLN
1000.0000 mg | INTRAVENOUS | Status: AC
  Administered 2022-06-18: 1000 mg via INTRAVENOUS
  Filled 2022-06-18: qty 200

## 2022-06-18 MED ORDER — ASPIRIN 81 MG PO CHEW
81.0000 mg | CHEWABLE_TABLET | Freq: Two times a day (BID) | ORAL | Status: DC
  Administered 2022-06-18 – 2022-06-21 (×6): 81 mg via ORAL
  Filled 2022-06-18 (×6): qty 1

## 2022-06-18 MED ORDER — PROMETHAZINE HCL 25 MG/ML IJ SOLN: 6.2500 mg | INTRAMUSCULAR | Status: DC | PRN

## 2022-06-18 MED ORDER — SODIUM CHLORIDE 0.9 % IV SOLN: INTRAVENOUS | Status: DC

## 2022-06-18 MED ORDER — CHLORHEXIDINE GLUCONATE 0.12 % MT SOLN
15.0000 mL | Freq: Once | OROMUCOSAL | Status: AC
  Administered 2022-06-18: 15 mL via OROMUCOSAL

## 2022-06-18 MED ORDER — ACETAMINOPHEN 500 MG PO TABS
1000.0000 mg | ORAL_TABLET | Freq: Once | ORAL | Status: AC
  Administered 2022-06-18: 1000 mg via ORAL
  Filled 2022-06-18: qty 2

## 2022-06-18 MED ORDER — HYDROMORPHONE HCL 1 MG/ML IJ SOLN
INTRAMUSCULAR | Status: AC
  Administered 2022-06-18: 0.5 mg via INTRAVENOUS
  Filled 2022-06-18: qty 3

## 2022-06-18 MED ORDER — METOCLOPRAMIDE HCL 5 MG/ML IJ SOLN: 5.0000 mg | Freq: Three times a day (TID) | INTRAMUSCULAR | Status: DC | PRN

## 2022-06-18 SURGICAL SUPPLY — 44 items
APL SKNCLS STERI-STRIP NONHPOA (GAUZE/BANDAGES/DRESSINGS)
BAG COUNTER SPONGE SURGICOUNT (BAG) ×2 IMPLANT
BAG SPEC THK2 15X12 ZIP CLS (MISCELLANEOUS) ×1
BAG SPNG CNTER NS LX DISP (BAG) ×1
BAG ZIPLOCK 12X15 (MISCELLANEOUS) IMPLANT
BALL HIP CERAMIC (Hips) IMPLANT
BENZOIN TINCTURE PRP APPL 2/3 (GAUZE/BANDAGES/DRESSINGS) IMPLANT
BLADE SAW SGTL 18X1.27X75 (BLADE) ×2 IMPLANT
COVER PERINEAL POST (MISCELLANEOUS) ×2 IMPLANT
COVER SURGICAL LIGHT HANDLE (MISCELLANEOUS) ×2 IMPLANT
CUP SECTOR GRIPTON 50MM (Cup) IMPLANT
DRAPE FOOT SWITCH (DRAPES) ×2 IMPLANT
DRAPE STERI IOBAN 125X83 (DRAPES) ×2 IMPLANT
DRAPE U-SHAPE 47X51 STRL (DRAPES) ×4 IMPLANT
DRSG AQUACEL AG ADV 3.5X10 (GAUZE/BANDAGES/DRESSINGS) ×2 IMPLANT
DURAPREP 26ML APPLICATOR (WOUND CARE) ×2 IMPLANT
ELECT REM PT RETURN 15FT ADLT (MISCELLANEOUS) ×2 IMPLANT
GAUZE XEROFORM 1X8 LF (GAUZE/BANDAGES/DRESSINGS) ×2 IMPLANT
GLOVE BIO SURGEON STRL SZ7.5 (GLOVE) ×2 IMPLANT
GLOVE BIOGEL PI IND STRL 8 (GLOVE) ×4 IMPLANT
GLOVE ECLIPSE 8.0 STRL XLNG CF (GLOVE) ×2 IMPLANT
GOWN STRL REUS W/ TWL XL LVL3 (GOWN DISPOSABLE) ×4 IMPLANT
GOWN STRL REUS W/TWL XL LVL3 (GOWN DISPOSABLE) ×2
HANDPIECE INTERPULSE COAX TIP (DISPOSABLE) ×1
HIP BALL CERAMIC (Hips) ×1 IMPLANT
HOLDER FOLEY CATH W/STRAP (MISCELLANEOUS) ×2 IMPLANT
KIT TURNOVER KIT A (KITS) IMPLANT
LINER ACET PNNCL PLUS4 NEUTRAL (Hips) IMPLANT
PACK ANTERIOR HIP CUSTOM (KITS) ×2 IMPLANT
PINNACLE PLUS 4 NEUTRAL (Hips) ×1 IMPLANT
SET HNDPC FAN SPRY TIP SCT (DISPOSABLE) ×2 IMPLANT
STAPLER VISISTAT 35W (STAPLE) IMPLANT
STEM FEM ACTIS HIGH SZ3 (Stem) IMPLANT
STRIP CLOSURE SKIN 1/2X4 (GAUZE/BANDAGES/DRESSINGS) IMPLANT
SUT ETHIBOND NAB CT1 #1 30IN (SUTURE) ×2 IMPLANT
SUT ETHILON 2 0 PS N (SUTURE) IMPLANT
SUT MNCRL AB 4-0 PS2 18 (SUTURE) IMPLANT
SUT VIC AB 0 CT1 36 (SUTURE) ×2 IMPLANT
SUT VIC AB 1 CT1 36 (SUTURE) ×2 IMPLANT
SUT VIC AB 2-0 CT1 27 (SUTURE) ×2
SUT VIC AB 2-0 CT1 TAPERPNT 27 (SUTURE) ×4 IMPLANT
TRAY FOLEY MTR SLVR 16FR STAT (SET/KITS/TRAYS/PACK) IMPLANT
TUBE SUCTION HIGH CAP CLEAR NV (SUCTIONS) IMPLANT
YANKAUER SUCT BULB TIP NO VENT (SUCTIONS) ×2 IMPLANT

## 2022-06-18 NOTE — Transfer of Care (Signed)
Immediate Anesthesia Transfer of Care Note  Patient: [name]  Procedure(s) Performed: LEFT TOTAL HIP ARTHROPLASTY ANTERIOR APPROACH (Left: Hip)  Patient Location: PACU  Anesthesia Type:Spinal  Level of Consciousness: awake, alert  and oriented  Airway & Oxygen Therapy: Patient Spontanous Breathing and Patient connected to face mask oxygen  Post-op Assessment: Report given to RN and Post -op Vital signs reviewed and stable  Post vital signs: Reviewed and stable  Last Vitals:  Vitals Value Taken Time  BP 120/69 06/18/22 0912  Temp    Pulse 95 06/18/22 0913  Resp 17 06/18/22 0913  SpO2 98 % 06/18/22 0913  Vitals shown include unvalidated device data.  Last Pain:  Vitals:   06/18/22 0534  TempSrc: Oral  PainSc: 8       Patients Stated Pain Goal: 5 (19/16/60 6004)  Complications: No notable events documented.

## 2022-06-18 NOTE — Anesthesia Procedure Notes (Signed)
Spinal  End time: 06/18/2022 7:45 AM Reason for block: surgical anesthesia Staffing Performed: anesthesiologist  Anesthesiologist: Annye Asa, MD Resident/CRNA: Marijo Conception, CRNA Performed by: Annye Asa, MD Authorized by: Annye Asa, MD   Preanesthetic Checklist Completed: patient identified, IV checked, site marked, risks and benefits discussed, surgical consent, monitors and equipment checked, pre-op evaluation and timeout performed Spinal Block Patient position: sitting Prep: DuraPrep and site prepped and draped Patient monitoring: blood pressure, continuous pulse ox, cardiac monitor and heart rate Approach: midline Location: L2-3 Injection technique: single-shot Needle Needle type: Quincke  Needle gauge: 22 G Needle length: 9 cm Assessment Events: CSF return and second provider Additional Notes Pt identified in Operating room.  Monitors applied. Working IV access confirmed. Sterile prep, drape lumbar spine.  1% lido local L 3,4. #24ga Pencan all attempts os, repeat local and #22ga Quincke into CSF.  '12mg'$  0.75% Bupivacaine with dextrose injected with asp CSF beginning and end of injection.  Patient asymptomatic, VSS, no heme aspirated, tolerated well.  Jenita Seashore, MD

## 2022-06-18 NOTE — Op Note (Signed)
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 Ninfa Linden, MD Assistant: Benita Stabile, PA-C  Esthesia: Spinal Antibiotics: IV vancomycin and IV Ancef EBL: 244 cc Complications: None  Occasions: The patient is a very pleasant 71 year old female with known severe osteoarthritis of her left hip.  She has tried and failed all forms of conservative treatment.  This has been well-documented on clinical exam and x-ray findings.  She has a significant leg length discrepancy with her left side shorter than the right.  She had multiple falls as well.  At this point we recommended a total hip arthroplasty.  We talked in length in detail the risk of acute blood loss anemia, nerve or vessel injury, fracture, infection, DVT, implant failure, leg length differences and dislocation.  We talked about wound healing issues as well.  We discussed the goals of decreased pain, improve mobility, and improve quality of life.  Procedure description: After informed consent was obtained and appropriate left hip was marked, the patient was brought to the operating room and set up on the stretcher where spinal anesthesia was obtained.  She was then laid in supine position again on the stretcher and a Foley catheter was placed.  I assessed her leg lengths and she significant shorter on the left than the right.  Traction boots were placed on both her feet and we placed her supine on the Hana fracture table with a perineal post in place in both legs and inline skeletal traction devices but no traction applied.  I then assessed her again radiographically under fluoroscopic  guidance and it appears her leg lengths are equal even though clinically we know they are not so we know that we have to make her look longer on the films.  The left operative hip was then prepped and draped with DuraPrep and sterile drapes.  Timeout was called and she identified as correct patient correct left hip.  An incision was then made just inferior and posterior to the ASIS and carried slightly obliquely down the leg.  Dissection was carried down to the tensor fascia lata muscle.  The tensor fascia was then divided longitudinally to proceed with a directed approach to the hip.  Circumflex vessels were identified and cauterized.  The hip capsule was identified and opened up in an L-type format finding a large effusion.  Cobra retractors were placed around the medial lateral femoral neck and a femoral neck cut was made with an oscillating saw just proximal to the lesser trochanter and completed with an osteotome.  A corkscrew guide was placed in the femoral head and the femoral head was removed in its entirety and found a wide area devoid of cartilage.  A bent Hohmann was then placed over the medial acetabular rim.  Remnants of the acetabular labrum and other debris were then removed.  Reaming was then started from a size 43 reamer and stepwise increments up to a size 49 reamer with all reamers placed under direct visualization in the last reamer was placed in direct fluoroscopy so we could obtain our depth of reaming, or inclination and her anteversion.  The real DePuy sector GRIPTION acetabular component size 50 was then placed without difficulty.  We then went with  a 32+4 polythene liner for the size 50 acetabular opponent.  Attention was then turned to the femur.  With the left leg externally rotated to 120 degrees, extended and adducted, the left leg was brought down and under.  A Mueller retractor was placed medially and a Hohmann tractor placed behind the greater trochanter.  A box cutting osteotome was  used to enter the femoral canal.  I then began broaching using the Actis broaching system from a size 0 going to a size 3.  With a size 3 in place we trialed a standard offset femoral neck and a 32+1 hip ball.  This was reduced in the pelvis and it was definitely unstable knowing that we needed more offset and leg length.  We remove the trial components and we went with the real size 3 Actis femoral component but well with high offset.  We went with the real 32+5 ceramic hip ball.  This was then reduced in the acetabulum and we are pleased with range of motion, stability, offset and leg length assessed mechanically and radiographically.  Again the leg lengths radiographically like she is longer but we know that she started off shorter with equal appearing leg lengths on the preoperative films.  I was pleased with stability.  We then irrigated the soft tissue normal saline solution.  The joint capsule was closed with interrupted #1 Ethibond suture followed by #1 Vicryl to close the tensor fascia.  0 Vicryl used to close the deep tissue and 2-0 Vicryl used to close subcutaneous tissue.  The skin was closed with staples.  An Aquacel dressing was applied.  The patient is taken off the Hana table and taken recovery in stable addition with all final counts being correct and no complications noted.  Of note Benita Stabile, PA-C did assist during the entire case from beginning to end and his assistance was medically necessary and crucial for soft tissue management and retraction, helping guide implant placement and a layered closure of the wound.

## 2022-06-18 NOTE — Plan of Care (Signed)
  Problem: Education: Goal: Knowledge of the prescribed therapeutic regimen will improve Outcome: Progressing Goal: Understanding of discharge needs will improve Outcome: Progressing Goal: Individualized Educational Video(s) Outcome: Progressing   Problem: Activity: Goal: Ability to avoid complications of mobility impairment will improve Outcome: Progressing Goal: Ability to tolerate increased activity will improve Outcome: Progressing   Problem: Clinical Measurements: Goal: Postoperative complications will be avoided or minimized Outcome: Progressing   Problem: Pain Management: Goal: Pain level will decrease with appropriate interventions Outcome: Progressing   Problem: Skin Integrity: Goal: Will show signs of wound healing Outcome: Progressing   Problem: Education: Goal: Knowledge of General Education information will improve Description: Including pain rating scale, medication(s)/side effects and non-pharmacologic comfort measures Outcome: Progressing   Problem: Health Behavior/Discharge Planning: Goal: Ability to manage health-related needs will improve Outcome: Progressing   Problem: Clinical Measurements: Goal: Ability to maintain clinical measurements within normal limits will improve Outcome: Progressing Goal: Will remain free from infection Outcome: Progressing Goal: Diagnostic test results will improve Outcome: Progressing Goal: Respiratory complications will improve Outcome: Progressing Goal: Cardiovascular complication will be avoided Outcome: Progressing   Problem: Activity: Goal: Risk for activity intolerance will decrease Outcome: Progressing   Problem: Nutrition: Goal: Adequate nutrition will be maintained Outcome: Progressing   Problem: Elimination: Goal: Will not experience complications related to bowel motility Outcome: Progressing Goal: Will not experience complications related to urinary retention Outcome: Progressing   Problem: Pain  Managment: Goal: General experience of comfort will improve Outcome: Progressing   Problem: Safety: Goal: Ability to remain free from injury will improve Outcome: Progressing   Problem: Skin Integrity: Goal: Risk for impaired skin integrity will decrease Outcome: Progressing   Problem: Coping: Goal: Ability to adjust to condition or change in health will improve Outcome: Progressing   Problem: Fluid Volume: Goal: Ability to maintain a balanced intake and output will improve Outcome: Progressing   Problem: Health Behavior/Discharge Planning: Goal: Ability to identify and utilize available resources and services will improve Outcome: Progressing Goal: Ability to manage health-related needs will improve Outcome: Progressing   Problem: Metabolic: Goal: Ability to maintain appropriate glucose levels will improve Outcome: Progressing   Problem: Nutritional: Goal: Maintenance of adequate nutrition will improve Outcome: Progressing Goal: Progress toward achieving an optimal weight will improve Outcome: Progressing

## 2022-06-18 NOTE — Evaluation (Signed)
Physical Therapy Evaluation Patient Details Name: [name] MRN: [id number] DOB: 11/30/1950 Today's Date: 06/18/2022  History of Present Illness  71 yo  female S/P L THA, direct anterior on 06/18/22.[name] PMH: S/D HF, PPM,neurogenic bladder, chronic pain, depression, HTN, back surgeries,  Clinical Impression  The patient  admitted for above problems. Patient is eager to get  out of bed. Patient rates groin pain, which was present PTA. Decreased by report after ambulating x 60' with RW.  Patient  reports spouse is unable to assist , Sister coming 10/8-Sunday  from out of town and friend who brought her to surgery not available.  Patient will need to be as mobile and independent  as possible  at Dc.  Pt admitted with above diagnosis.  Pt currently with functional limitations due to the deficits listed below (see PT Problem List). Pt will benefit from skilled PT to increase their independence and safety with mobility to allow discharge to the venue listed below.        Recommendations for follow up therapy are one component of a multi-disciplinary discharge planning process, led by the attending physician.  Recommendations may be updated based on patient status, additional functional criteria and insurance authorization.  Follow Up Recommendations Follow physician's recommendations for discharge plan and follow up therapies      Assistance Recommended at Discharge Set up Supervision/Assistance  Patient can return home with the following  A little help with bathing/dressing/bathroom;Assistance with cooking/housework;Assist for transportation;Help with stairs or ramp for entrance    Equipment Recommendations Rolling walker (2 wheels) (youth)  Recommendations for Other Services       Functional Status Assessment Patient has had a recent decline in their functional status and demonstrates the ability to make significant improvements in function in a reasonable and predictable amount of time.      Precautions / Restrictions Precautions Precautions: Fall Precaution Comments: PPM      Mobility  Bed Mobility Overal bed mobility: Needs Assistance Bed Mobility: Supine to Sit     Supine to sit: Min guard     General bed mobility comments: used belt to slide the LLE to bed edge    Transfers Overall transfer level: Needs assistance Equipment used: Rolling walker (2 wheels) (youth) Transfers: Sit to/from Stand Sit to Stand: Min guard           General transfer comment: cuea for hand placement    Ambulation/Gait Ambulation/Gait assistance: Min guard Gait Distance (Feet): 60 Feet Assistive device: Rolling walker (2 wheels) Gait Pattern/deviations: Step-to pattern, Step-through pattern, Antalgic       General Gait Details: able to advanc e LLE well  Stairs            Wheelchair Mobility    Modified Rankin (Stroke Patients Only)       Balance Overall balance assessment: Mild deficits observed, not formally tested                                           Pertinent Vitals/Pain Pain Assessment Pain Assessment: 0-10 Pain Score: 8  Pain Location: left groin and tialbone Pain Descriptors / Indicators: Aching, Burning, Discomfort Pain Intervention(s): Monitored during session, Premedicated before session, Patient requesting pain meds-RN notified, Ice applied    Home Living Family/patient expects to be discharged to:: Private residence Living Arrangements: Spouse/significant other;Non-relatives/Friends Available Help at Discharge: Family Type of Home: [place]  Access: Ramped entrance       Home Layout: One level Home Equipment: None Additional Comments: Pt's siblings from out of state coming to assist, also has supportive friends. Pt's husband is dependent and unable to provide assist    Prior Function Prior Level of Function : Independent/Modified Independent               ADLs Comments: primary caregiver for  dependent husband     Hand Dominance        Extremity/Trunk Assessment   Upper Extremity Assessment Upper Extremity Assessment: Overall WFL for tasks assessed    Lower Extremity Assessment Lower Extremity Assessment: LLE deficits/detail LLE Deficits / Details: able to flex hip and advance to amb.    Cervical / Trunk Assessment Cervical / Trunk Assessment: Normal  Communication   Communication: No difficulties  Cognition Arousal/Alertness: Awake/alert Behavior During Therapy: WFL for tasks assessed/performed Overall Cognitive Status: Within Functional Limits for tasks assessed                                          General Comments      Exercises Total Joint Exercises Ankle Circles/Pumps: AROM, Both, 10 reps Heel Slides: AAROM, Left, 10 reps Hip ABduction/ADduction: AAROM, 10 reps, Left   Assessment/Plan    PT Assessment Patient needs continued PT services  PT Problem List Decreased strength;Decreased mobility;Decreased safety awareness;Decreased activity tolerance;Decreased knowledge of use of DME;Decreased knowledge of precautions       PT Treatment Interventions DME instruction;Therapeutic activities;Gait training;Therapeutic exercise;Patient/family education;Stair training;Functional mobility training    PT Goals (Current goals can be found in the Care Plan section)  Acute Rehab PT Goals Patient Stated Goal: to  not have pain PT Goal Formulation: With patient Time For Goal Achievement: 06/25/22 Potential to Achieve Goals: Good    Frequency 7X/week     Co-evaluation               AM-PAC PT "6 Clicks" Mobility  Outcome Measure Help needed turning from your back to your side while in a flat bed without using bedrails?: A Little Help needed moving from lying on your back to sitting on the side of a flat bed without using bedrails?: A Little Help needed moving to and from a bed to a chair (including a wheelchair)?: A Little Help  needed standing up from a chair using your arms (e.g., wheelchair or bedside chair)?: A Little Help needed to walk in hospital room?: A Little Help needed climbing 3-5 steps with a railing? : A Lot 6 Click Score: 17    End of Session Equipment Utilized During Treatment: Gait belt Activity Tolerance: Patient tolerated treatment well Patient left: in chair;with call bell/phone within reach;with chair alarm set Nurse Communication: Mobility status;Patient requests pain meds PT Visit Diagnosis: Unsteadiness on feet (R26.81);Difficulty in walking, not elsewhere classified (R26.2);Pain Pain - Right/Left: Left Pain - part of body: Hip    Time: 1631-1700 PT Time Calculation (min) (ACUTE ONLY): 29 min   Charges:   PT Evaluation $PT Eval Low Complexity: 1 Low PT Treatments $Gait Training: 8-22 mins        [place] Office [phone] Weekend [phone]   Claretha Cooper 06/18/2022, 5:05 PM

## 2022-06-18 NOTE — Anesthesia Postprocedure Evaluation (Signed)
Anesthesia Post Note  Patient: JAZLIN TAPSCOTT  Procedure(s) Performed: LEFT TOTAL HIP ARTHROPLASTY ANTERIOR APPROACH (Left: Hip)     Patient location during evaluation: PACU Anesthesia Type: Spinal Level of consciousness: sedated, patient cooperative and oriented Pain management: pain level controlled Vital Signs Assessment: post-procedure vital signs reviewed and stable Respiratory status: nonlabored ventilation, spontaneous breathing and respiratory function stable Cardiovascular status: blood pressure returned to baseline and stable Postop Assessment: patient able to bend at knees, spinal receding and no apparent nausea or vomiting Anesthetic complications: no   No notable events documented.  Last Vitals:  Vitals:   06/18/22 1000 06/18/22 1015  BP: (!) 142/88 117/64  Pulse: 84 73  Resp: 16 12  Temp:    SpO2: 97% 97%    Last Pain:  Vitals:   06/18/22 1020  TempSrc:   PainSc: 6                  Lachlan Pelto,E. Jayshaun Phillips

## 2022-06-18 NOTE — Progress Notes (Signed)
Pt has arrived to 1330 from PACU s/p  left THA.  Report accepted from Courtney,RN.  Pt is alert and oriented.  Room orientation completed with call bell placed at bedside.  Will continue to monitor pt.

## 2022-06-18 NOTE — H&P (Signed)
TOTAL HIP ADMISSION H&P  Patient is admitted for left total hip arthroplasty.  Subjective:  Chief Complaint: left hip pain  HPI: [name], 71 y.o. female, has a history of pain and functional disability in the left hip(s) due to arthritis and patient has failed non-surgical conservative treatments for greater than 12 weeks to include NSAID's and/or analgesics, corticosteriod injections, use of assistive devices, and activity modification.  Onset of symptoms was gradual starting 3 years ago with rapidlly worsening course since that time.The patient noted no past surgery on the left hip(s).  Patient currently rates pain in the left hip at 10 out of 10 with activity. Patient has night pain, worsening of pain with activity and weight bearing, trendelenberg gait, pain that interfers with activities of daily living, and pain with passive range of motion. Patient has evidence of subchondral cysts, subchondral sclerosis, periarticular osteophytes, and joint space narrowing by imaging studies. This condition presents safety issues increasing the risk of falls.  There is no current active infection.  Patient Active Problem List   Diagnosis Date Noted   Unilateral primary osteoarthritis, left hip 06/18/2022   Visual hallucination 02/25/2022   Delirium 83/41/9622   Metabolic acidosis 29/79/8921   Hypophosphatemia 02/22/2022   Hypomagnesemia 02/22/2022   Acute blood loss anemia superimposed on anemia of chronic disease 02/20/2022   Acute toxic and metabolic encephalopathy 19/41/7408   Thrombocytopenia ([place]) 02/20/2022   Hypokalemia 02/20/2022   Rhabdomyolysis 02/20/2022   Lactic acidosis 02/20/2022   Urinary tract infection 02/20/2022   Clostridioides difficile infection 02/18/2022   Facial trauma due to fall at home 02/17/2022   AKI (acute kidney injury) ([place]) 02/17/2022   Hypovolemic shock ([place])    Cardiomyopathy ([place]) 11/14/2020   Diabetic nephropathy ([place]) 11/14/2020   Impaired mobility  and activities of daily living 06/10/2020   Alcohol withdrawal syndrome ([place]) 06/09/2020   Facet arthropathy, lumbar 06/09/2020   Sepsis ([place]) 06/09/2020   Sinus tachycardia by electrocardiogram 06/09/2020   Spondylolisthesis of lumbosacral region 06/09/2020   Altered mental status 03/12/2020   Stage 3b chronic kidney disease ([place]) 10/24/2019   Urine leukocytes 09/24/2019   Other microscopic hematuria 09/24/2019   Postmenopause 02/08/2018   Screening for colorectal cancer 02/08/2018   Well woman exam with routine gynecological exam 02/08/2018   Anemia in chronic kidney disease 12/15/2016   Uncontrolled hypertension 09/12/2015   Cervical high risk HPV (human papillomavirus) test positive 01/30/2015   Biventricular ICD (implantable cardioverter-defibrillator) in place 14/48/1856   Chronic systolic heart failure ([place]) 12/18/2013   Preop cardiovascular exam 11/17/2011   Colon cancer screening 02/04/2011   Uncontrolled IDDM-2 with hyperglycemia and CKD-3B 12/31/2009   Mixed hyperlipidemia 12/31/2009   ANXIETY DEPRESSION 12/31/2009   GASTROESOPHAGEAL REFLUX DISEASE, CHRONIC 12/31/2009   CONSTIPATION, CHRONIC 12/31/2009   Chronic lower back pain 12/31/2009   Cerebral vascular accident ([place]) 05/22/2009   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   HYPERTENSION, HEART CONTROLLED W/O ASSOC CHF 11/27/2008   Chronic combined CHF s/p BIV PPM now with recovered EF 11/27/2008   PACEMAKER, PERMANENT 11/27/2008   Past Medical History:  Diagnosis Date   Anxiety    Arthritis    Cardiomyopathy    Cervical high risk HPV (human papillomavirus) test positive 01/30/2015   CHF (congestive heart failure) (HCC)    Chronic alcohol abuse 06/09/2020   Chronic constipation    Chronic kidney disease    Chronic lower back pain    Constipation 01/30/2015   Depression    GERD (gastroesophageal  reflux disease)    Heart failure    Chronic combined   Heart murmur    HTN (hypertension)     Hypercholesteremia    Neurogenic bladder    2008 nerve damage s/p back ssurgery   Presence of permanent cardiac pacemaker    Type II diabetes mellitus [facility])     Past Surgical History:  Procedure Laterality Date   ACHILLES TENDON SURGERY Right 06/14/2012   BACK SURGERY  09/15/2003   BI-VENTRICULAR IMPLANTABLE CARDIOVERTER DEFIBRILLATOR  (CRT-D)  12/18/2013   upgrade of previously implanted dual chamber pacemaker to MDT CRTD with RV lead revision by Dr Placido Sou PACEMAKER UPGRADE  12/18/2013   w/defibrillator   BIV PACEMAKER GENERATOR CHANGE OUT N/A 12/18/2013   Procedure: BIV PACEMAKER GENERATOR CHANGE OUT;  Surgeon: Evans Lance, MD;  [facility] C[facility] LAB;  Service: Cardiovascular;  Laterality: N/A;   BIV PACEMAKER INSERTION CRT-P N/A 02/22/2020   Procedure: DOWNGRADE BIV PACEMAKER INSERTION CRT-P;  Surgeon: Evans Lance, MD;  Location: [place] CV LAB;  Service: Cardiovascular;  Laterality: N/A;   BREAST BIOPSY Left 09/15/1971   benign   BREAST LUMPECTOMY Left 09/15/1971   CATARACT EXTRACTION Bilateral    CESAREAN SECTION  09/15/1979   CHOLECYSTECTOMY     GASTROCNEMIUS RECESSION  06/24/2012   Procedure: GASTROCNEMIUS SLIDE;  Surgeon: Colin Rhein, MD;  Location: [facility] ORS;  Service: Orthopedics;  Laterality: Right;   HERNIA REPAIR     LACERATION REPAIR Left 02/17/2022   Procedure: REPAIR LEFT CHEEK COMPLEX LACERATION WITH MYRIAD PLACEMENT;  Surgeon: Wallace Going, DO;  Location: [place];  Service: Plastics;  Laterality: Left;   LEAD REVISION N/A 12/18/2013   Procedure: LEAD REVISION;  Surgeon: Evans Lance, MD;  Location: [facility] CATH LAB;  Service: Cardiovascular;  Laterality: N/A;   PACEMAKER INSERTION  09/14/2004   Dual chamber MDT pacemaker implanted - subsequent upgrade to CRTD 12-2013   POSTERIOR LUMBAR FUSION  2006; 2008   SIGMOIDOSCOPY  MAY 2011 w/ PROP   iTCS-->FSIG-poor bowel prep   SKIN FULL THICKNESS GRAFT Left 04/06/2022   Procedure: SKIN  GRAFT FULL THICKNESS;  Surgeon: Wallace Going, DO;  Location: [place];  Service: Plastics;  Laterality: Left;   TUBAL LIGATION  ~ 1984   UPPER GASTROINTESTINAL ENDOSCOPY  MAY 2011 w/ PROP   MILD GASTRITIS 2o ETOH   WISDOM TOOTH EXTRACTION      Current Facility-Administered Medications  Medication Dose Route Frequency Provider Last Rate Last Admin   ceFAZolin (ANCEF) IVPB 2g/100 mL premix  2 g Intravenous On Call to OR Pete Pelt, PA-C       lactated ringers infusion   Intravenous Continuous Santa Lighter, MD 10 mL/hr at 06/18/22 5498 Continued from Pre-op at 06/18/22 2641   tranexamic acid (CYKLOKAPRON) IVPB 1,000 mg  1,000 mg Intravenous To OR Erskine Emery W, PA-C       vancomycin (VANCOCIN) IVPB 1000 mg/200 mL premix  1,000 mg Intravenous On Call to OR Adrian Saran, RPH       No Known Allergies  Social History   Tobacco Use   Smoking status: Never   Smokeless tobacco: Never  Substance Use Topics   Alcohol use: Yes    Alcohol/week: 6.0 standard drinks of alcohol    Types: 6 Cans of beer per week    Family History  Problem Relation Age of Onset   Alzheimer's disease Mother    Diabetes Mother  Cancer Father    Cancer Sister        breast   Diabetes Sister    Diabetes Brother    Brain cancer Brother    Diabetes Brother    Colon cancer Neg Hx    Colon polyps Neg Hx      Review of Systems  Objective:  Physical Exam Vitals reviewed.  Constitutional:      Appearance: She is normal weight.  HENT:     Head: Normocephalic and atraumatic.  Eyes:     Extraocular Movements: Extraocular movements intact.     Pupils: Pupils are equal, round, and reactive to light.  Cardiovascular:     Rate and Rhythm: Normal rate.     Pulses: Normal pulses.  Pulmonary:     Effort: Pulmonary effort is normal.     Breath sounds: Normal breath sounds.  Abdominal:     Palpations: Abdomen is soft.  Musculoskeletal:     Cervical back: Normal range of  motion and neck supple.     Left hip: Tenderness and bony tenderness present. Decreased range of motion. Decreased strength.  Neurological:     Mental Status: She is alert and oriented to person, place, and time.  Psychiatric:        Behavior: Behavior normal.     Vital signs in last 24 hours: Temp:  [98.5 F (36.9 C)] 98.5 F (36.9 C) (10/05 0534) Pulse Rate:  [84] 84 (10/05 0534) Resp:  [18] 18 (10/05 0534) BP: (168)/(89) 168/89 (10/05 0534) SpO2:  [98 %] 98 % (10/05 0534) Weight:  [61 kg] 61 kg (10/05 0534)  Labs:   Estimated body mass index is 25.39 kg/m as calculated from the following:   Height as of this encounter: _0  (1.549 m).   Weight as of this encounter: 61 kg.   Imaging Review Plain radiographs demonstrate severe degenerative joint disease of the left hip(s). The bone quality appears to be good for age and reported activity level.      Assessment/Plan:  End stage arthritis, left hip(s)  The patient history, physical examination, clinical judgement of the provider and imaging studies are consistent with end stage degenerative joint disease of the left hip(s) and total hip arthroplasty is deemed medically necessary. The treatment options including medical management, injection therapy, arthroscopy and arthroplasty were discussed at length. The risks and benefits of total hip arthroplasty were presented and reviewed. The risks due to aseptic loosening, infection, stiffness, dislocation/subluxation,  thromboembolic complications and other imponderables were discussed.  The patient acknowledged the explanation, agreed to proceed with the plan and consent was signed. Patient is being admitted for inpatient treatment for surgery, pain control, PT, OT, prophylactic antibiotics, VTE prophylaxis, progressive ambulation and ADL's and discharge planning.The patient is planning to be discharged home with home health services

## 2022-06-19 LAB — BASIC METABOLIC PANEL
Anion gap: 7 (ref 5–15)
BUN: 25 mg/dL — ABNORMAL HIGH (ref 8–23)
CO2: 26 mmol/L (ref 22–32)
Calcium: 8.7 mg/dL — ABNORMAL LOW (ref 8.9–10.3)
Chloride: 108 mmol/L (ref 98–111)
Creatinine, Ser: 1.84 mg/dL — ABNORMAL HIGH (ref 0.44–1.00)
GFR, Estimated: 29 mL/min — ABNORMAL LOW (ref >60)
Glucose, Bld: 118 mg/dL — ABNORMAL HIGH (ref 70–99)
Potassium: 3.9 mmol/L (ref 3.5–5.1)
Sodium: 141 mmol/L (ref 135–145)

## 2022-06-19 LAB — GLUCOSE, CAPILLARY
Glucose-Capillary: 156 mg/dL — ABNORMAL HIGH (ref 70–99)
Glucose-Capillary: 200 mg/dL — ABNORMAL HIGH (ref 70–99)
Glucose-Capillary: 142 mg/dL — ABNORMAL HIGH (ref 70–99)
Glucose-Capillary: 143 mg/dL — ABNORMAL HIGH (ref 70–99)

## 2022-06-19 LAB — CBC
HCT: 28.8 % — ABNORMAL LOW (ref 36.0–46.0)
Hemoglobin: 9.1 g/dL — ABNORMAL LOW (ref 12.0–15.0)
MCH: 30.5 pg (ref 26.0–34.0)
MCHC: 31.6 g/dL (ref 30.0–36.0)
MCV: 96.6 fL (ref 80.0–100.0)
Platelets: 186 10*3/uL (ref 150–400)
RBC: 2.98 MIL/uL — ABNORMAL LOW (ref 3.87–5.11)
RDW: 13.2 % (ref 11.5–15.5)
WBC: 7.9 10*3/uL (ref 4.0–10.5)
nRBC: 0 % (ref 0.0–0.2)

## 2022-06-19 NOTE — Progress Notes (Signed)
Physical Therapy Treatment Patient Details Name: [name] MRN: [id number] DOB: 01/11/1951 Today's Date: 06/19/2022   History of Present Illness 71 yo  female S/P L THA, direct anterior on 06/18/22.[name] PMH: S/D HF, PPM,neurogenic bladder(self caths), chronic pain, depression, HTN, back surgeries,    PT Comments    Patient very drowsy, able to mobilize and ambulate to BR, with min/minguard assistance and RW attempted to self cath but unable. RN alerted to assist patient after patient returned to bed with min assistance.   Continue PT for mobility and  exercises.  Recommendations for follow up therapy are one component of a multi-disciplinary discharge planning process, led by the attending physician.  Recommendations may be updated based on patient status, additional functional criteria and insurance authorization.  Follow Up Recommendations  Follow physician's recommendations for discharge plan and follow up therapies     Assistance Recommended at Discharge Set up Supervision/Assistance  Patient can return home with the following A little help with bathing/dressing/bathroom;Assistance with cooking/housework;Assist for transportation;Help with stairs or ramp for entrance;A little help with walking and/or transfers   Equipment Recommendations  Rolling walker (2 wheels)    Recommendations for Other Services       Precautions / Restrictions Precautions Precautions: Fall Precaution Comments: PPM, self caths     Mobility  Bed Mobility Overal bed mobility: (P) Needs Assistance Bed Mobility: Supine to Sit, Sit to Supine     Supine to sit: Min assist Sit to supine: Min assist   General bed mobility comments: used belt and min assistance to get to sitting then to place legs onto bed.to slide the LLE to bed edge    Transfers Overall transfer level: Needs assistance Equipment used: Rolling walker (2 wheels) Transfers: Sit to/from Stand Sit to Stand: Min guard            General transfer comment: cues for hand placement, from bed and  low toilet    Ambulation/Gait Ambulation/Gait assistance: Min assist Gait Distance (Feet): 20 Feet (x 2) Assistive device: Rolling walker (2 wheels) Gait Pattern/deviations: Step-to pattern, Step-through pattern Gait velocity: decr     General Gait Details: cues  for posture   Stairs             Wheelchair Mobility    Modified Rankin (Stroke Patients Only)       Balance Overall balance assessment: Needs assistance Sitting-balance support: Bilateral upper extremity supported, Feet supported Sitting balance-Leahy Scale: Good     Standing balance support: During functional activity, Reliant on assistive device for balance, Single extremity supported Standing balance-Leahy Scale: Fair Standing balance comment: during self cath                            Cognition Arousal/Alertness: Lethargic                                     General Comments: aroused to mobilize to BR and attempt self cath.        Exercises      General Comments        Pertinent Vitals/Pain Pain Assessment Pain Score: 10-Worst pain ever Pain Location: left groin Pain Descriptors / Indicators: Aching, Burning, Discomfort Pain Intervention(s): Monitored during session, Premedicated before session, Repositioned    Home Living  Prior Function            PT Goals (current goals can now be found in the care plan section) Progress towards PT goals: Progressing toward goals    Frequency    7X/week      PT Plan Current plan remains appropriate    Co-evaluation              AM-PAC PT "6 Clicks" Mobility   Outcome Measure  Help needed turning from your back to your side while in a flat bed without using bedrails?: A Little Help needed moving from lying on your back to sitting on the side of a flat bed without using bedrails?: A Little Help  needed moving to and from a bed to a chair (including a wheelchair)?: A Little Help needed standing up from a chair using your arms (e.g., wheelchair or bedside chair)?: A Little Help needed to walk in hospital room?: A Little Help needed climbing 3-5 steps with a railing? : A Little 6 Click Score: 18    End of Session Equipment Utilized During Treatment: Gait belt Activity Tolerance: Patient limited by fatigue Patient left: in bed;with call bell/phone within reach;with nursing/sitter in room Nurse Communication: Mobility status (assist with self cath) PT Visit Diagnosis: Unsteadiness on feet (R26.81);Difficulty in walking, not elsewhere classified (R26.2);Pain Pain - Right/Left: Left Pain - part of body: Hip     Time: 5329-9242 PT Time Calculation (min) (ACUTE ONLY): 26 min  Charges:  $Gait Training: 8-22 mins $Self Care/Home Management: [place] Office [phone] Weekend pager-[phone]    Claretha Cooper 06/19/2022, 3:26 PM

## 2022-06-19 NOTE — Progress Notes (Signed)
Physical Therapy Treatment Patient Details Name: [name] MRN: [id number] DOB: May 26, 1951 Today's Date: 06/19/2022   History of Present Illness 71 yo  female S/P L THA, direct anterior on 06/18/22.[name] PMH: S/D HF, PPM,neurogenic bladder(self caths), chronic pain, depression, HTN, back surgeries,    PT Comments    Patient  tearful about pain, reports  in groin.  Patient premedicated, tolerated 39' with RW. Patient  has limited caregivers until Sunday.     Recommendations for follow up therapy are one component of a multi-disciplinary discharge planning process, led by the attending physician.  Recommendations may be updated based on patient status, additional functional criteria and insurance authorization.  Follow Up Recommendations  Follow physician's recommendations for discharge plan and follow up therapies     Assistance Recommended at Discharge Set up Supervision/Assistance  Patient can return home with the following A little help with bathing/dressing/bathroom;Assistance with cooking/housework;Assist for transportation;Help with stairs or ramp for entrance   Equipment Recommendations  Rolling walker (2 wheels)    Recommendations for Other Services       Precautions / Restrictions Precautions Precaution Comments: PPM     Mobility  Bed Mobility Overal bed mobility: (P) Needs Assistance Bed Mobility: Supine to Sit     Supine to sit: Min assist Sit to supine: (P) Min assist   General bed mobility comments: used belt to slide the LLE to bed edge    Transfers Overall transfer level: Needs assistance Equipment used: Rolling walker (2 wheels) Transfers: Sit to/from Stand Sit to Stand: Min guard           General transfer comment: cuea for hand placement    Ambulation/Gait Ambulation/Gait assistance: Min assist Gait Distance (Feet): 60 Feet Assistive device: Rolling walker (2 wheels) Gait Pattern/deviations: Step-to pattern, Step-through pattern Gait  velocity: decr     General Gait Details: cues  for posture   Stairs             Wheelchair Mobility    Modified Rankin (Stroke Patients Only)       Balance Overall balance assessment: Needs assistance Sitting-balance support: Bilateral upper extremity supported, Feet supported Sitting balance-Leahy Scale: Good     Standing balance support: During functional activity, Bilateral upper extremity supported, Reliant on assistive device for balance Standing balance-Leahy Scale: Fair                              Cognition Arousal/Alertness: Awake/alert                                     General Comments: patient emotionalm, stating she  did not get ambien and pain meds were over due.        Exercises      General Comments        Pertinent Vitals/Pain Pain Assessment Pain Score: 10-Worst pain ever Pain Location: left groin Pain Descriptors / Indicators: Aching, Burning, Discomfort Pain Intervention(s): Monitored during session, Premedicated before session    Home Living                          Prior Function            PT Goals (current goals can now be found in the care plan section) Progress towards PT goals: Progressing toward goals    Frequency  7X/week      PT Plan Current plan remains appropriate    Co-evaluation              AM-PAC PT "6 Clicks" Mobility   Outcome Measure  Help needed turning from your back to your side while in a flat bed without using bedrails?: A Little Help needed moving from lying on your back to sitting on the side of a flat bed without using bedrails?: A Little Help needed moving to and from a bed to a chair (including a wheelchair)?: A Little Help needed standing up from a chair using your arms (e.g., wheelchair or bedside chair)?: A Little Help needed to walk in hospital room?: A Little Help needed climbing 3-5 steps with a railing? : A Lot 6 Click Score: 17     End of Session Equipment Utilized During Treatment: Gait belt Activity Tolerance: Patient tolerated treatment well Patient left: in chair;with call bell/phone within reach;with chair alarm set Nurse Communication: Mobility status PT Visit Diagnosis: Unsteadiness on feet (R26.81);Difficulty in walking, not elsewhere classified (R26.2);Pain Pain - Right/Left: Left Pain - part of body: Hip     Time: 1410-3013 PT Time Calculation (min) (ACUTE ONLY): 30 min  Charges:  $Gait Training: 23-37 mins                     [place] Office [phone] Weekend [phone]    Claretha Cooper 06/19/2022, 3:20 PM

## 2022-06-19 NOTE — TOC Transition Note (Signed)
Transition of Care [facility]) - CM/SW Discharge Note   Patient Details  Name: [name] MRN: [id number] Date of Birth: June 15, 1951  [facility] CM/SW Contact:  Lennart Pall, LCSW Phone Number: 06/19/2022, 12:01 PM   Clinical Narrative:     Met with pt and confirming she has received RW via [employer] to room.  HHPT prearranged with [facility].  No further TOC needs.  Final next level of care: [place] Barriers to Discharge: No Barriers Identified   Patient Goals and CMS Choice Patient states their goals for this hospitalization and ongoing recovery are:: return home      Discharge Placement                       Discharge Plan and Services                DME Arranged: Walker rolling DME Agency: [employer]       HH Arranged: PT [place] Agency: [place]        Social Determinants of Health (SDOH) Interventions     Readmission Risk Interventions     No data to display

## 2022-06-20 DIAGNOSIS — N1832 Chronic kidney disease, stage 3b: Secondary | ICD-10-CM | POA: Diagnosis present

## 2022-06-20 DIAGNOSIS — Z820 Family history of epilepsy and other diseases of the nervous system: Secondary | ICD-10-CM | POA: Diagnosis not present

## 2022-06-20 DIAGNOSIS — Z808 Family history of malignant neoplasm of other organs or systems: Secondary | ICD-10-CM | POA: Diagnosis not present

## 2022-06-20 DIAGNOSIS — K219 Gastro-esophageal reflux disease without esophagitis: Secondary | ICD-10-CM | POA: Diagnosis present

## 2022-06-20 DIAGNOSIS — N319 Neuromuscular dysfunction of bladder, unspecified: Secondary | ICD-10-CM | POA: Diagnosis present

## 2022-06-20 DIAGNOSIS — Z96642 Presence of left artificial hip joint: Secondary | ICD-10-CM

## 2022-06-20 DIAGNOSIS — Z794 Long term (current) use of insulin: Secondary | ICD-10-CM | POA: Diagnosis not present

## 2022-06-20 DIAGNOSIS — Z8619 Personal history of other infectious and parasitic diseases: Secondary | ICD-10-CM | POA: Diagnosis not present

## 2022-06-20 DIAGNOSIS — E782 Mixed hyperlipidemia: Secondary | ICD-10-CM | POA: Diagnosis present

## 2022-06-20 DIAGNOSIS — I5042 Chronic combined systolic (congestive) and diastolic (congestive) heart failure: Secondary | ICD-10-CM | POA: Diagnosis present

## 2022-06-20 DIAGNOSIS — D631 Anemia in chronic kidney disease: Secondary | ICD-10-CM | POA: Diagnosis present

## 2022-06-20 DIAGNOSIS — E1140 Type 2 diabetes mellitus with diabetic neuropathy, unspecified: Secondary | ICD-10-CM | POA: Diagnosis present

## 2022-06-20 DIAGNOSIS — M2170 Unequal limb length (acquired), unspecified site: Secondary | ICD-10-CM | POA: Diagnosis present

## 2022-06-20 DIAGNOSIS — I426 Alcoholic cardiomyopathy: Secondary | ICD-10-CM | POA: Diagnosis present

## 2022-06-20 DIAGNOSIS — F1010 Alcohol abuse, uncomplicated: Secondary | ICD-10-CM | POA: Diagnosis present

## 2022-06-20 DIAGNOSIS — I130 Hypertensive heart and chronic kidney disease with heart failure and stage 1 through stage 4 chronic kidney disease, or unspecified chronic kidney disease: Secondary | ICD-10-CM | POA: Diagnosis present

## 2022-06-20 DIAGNOSIS — F419 Anxiety disorder, unspecified: Secondary | ICD-10-CM | POA: Diagnosis present

## 2022-06-20 DIAGNOSIS — Z981 Arthrodesis status: Secondary | ICD-10-CM | POA: Diagnosis not present

## 2022-06-20 DIAGNOSIS — R296 Repeated falls: Secondary | ICD-10-CM | POA: Diagnosis present

## 2022-06-20 DIAGNOSIS — F32A Depression, unspecified: Secondary | ICD-10-CM | POA: Diagnosis present

## 2022-06-20 DIAGNOSIS — Z95810 Presence of automatic (implantable) cardiac defibrillator: Secondary | ICD-10-CM | POA: Diagnosis not present

## 2022-06-20 DIAGNOSIS — M1612 Unilateral primary osteoarthritis, left hip: Secondary | ICD-10-CM | POA: Diagnosis present

## 2022-06-20 DIAGNOSIS — Z8673 Personal history of transient ischemic attack (TIA), and cerebral infarction without residual deficits: Secondary | ICD-10-CM | POA: Diagnosis not present

## 2022-06-20 DIAGNOSIS — Z833 Family history of diabetes mellitus: Secondary | ICD-10-CM | POA: Diagnosis not present

## 2022-06-20 DIAGNOSIS — E1122 Type 2 diabetes mellitus with diabetic chronic kidney disease: Secondary | ICD-10-CM | POA: Diagnosis present

## 2022-06-20 LAB — GLUCOSE, CAPILLARY
Glucose-Capillary: 144 mg/dL — ABNORMAL HIGH (ref 70–99)
Glucose-Capillary: 159 mg/dL — ABNORMAL HIGH (ref 70–99)
Glucose-Capillary: 169 mg/dL — ABNORMAL HIGH (ref 70–99)
Glucose-Capillary: 141 mg/dL — ABNORMAL HIGH (ref 70–99)

## 2022-06-20 LAB — CBC
HCT: 27.5 % — ABNORMAL LOW (ref 36.0–46.0)
Hemoglobin: 8.7 g/dL — ABNORMAL LOW (ref 12.0–15.0)
MCH: 30.4 pg (ref 26.0–34.0)
MCHC: 31.6 g/dL (ref 30.0–36.0)
MCV: 96.2 fL (ref 80.0–100.0)
Platelets: 178 10*3/uL (ref 150–400)
RBC: 2.86 MIL/uL — ABNORMAL LOW (ref 3.87–5.11)
RDW: 13.6 % (ref 11.5–15.5)
WBC: 7 10*3/uL (ref 4.0–10.5)
nRBC: 0 % (ref 0.0–0.2)

## 2022-06-20 MED ORDER — LIP MEDEX EX OINT
TOPICAL_OINTMENT | CUTANEOUS | Status: DC | PRN
  Filled 2022-06-20: qty 7

## 2022-06-20 MED ORDER — OXYCODONE HCL 5 MG PO TABS: 5.0000 mg | ORAL_TABLET | ORAL | 0 refills | Status: DC | PRN

## 2022-06-20 MED ORDER — ASPIRIN 81 MG PO CHEW: 81.0000 mg | CHEWABLE_TABLET | Freq: Two times a day (BID) | ORAL | 0 refills | Status: DC

## 2022-06-20 MED ORDER — TIZANIDINE HCL 4 MG PO TABS: 4.0000 mg | ORAL_TABLET | Freq: Four times a day (QID) | ORAL | 1 refills | Status: DC | PRN

## 2022-06-20 NOTE — Discharge Instructions (Signed)

## 2022-06-20 NOTE — Progress Notes (Signed)
Physical Therapy Treatment Patient Details Name: [name] MRN: [id number] DOB: 1951-07-08 Today's Date: 06/20/2022   History of Present Illness 71 yo  female S/P L THA, direct anterior on 06/18/22.[name] PMH: S/D HF, PPM,neurogenic bladder(self caths), chronic pain, depression, HTN, back surgeries,    PT Comments    Progressing slowly with mobility. She tolerated session well. She c/o pain, fatigue,and wanting to get some sleep. Will continue to progress activity as tolerated.    Recommendations for follow up therapy are one component of a multi-disciplinary discharge planning process, led by the attending physician.  Recommendations may be updated based on patient status, additional functional criteria and insurance authorization.  Follow Up Recommendations  Follow physician's recommendations for discharge plan and follow up therapies     Assistance Recommended at Discharge Intermittent Supervision/Assistance  Patient can return home with the following A little help with bathing/dressing/bathroom;Assistance with cooking/housework;Assist for transportation;Help with stairs or ramp for entrance;A little help with walking and/or transfers   Equipment Recommendations  Rolling walker (2 wheels)    Recommendations for Other Services       Precautions / Restrictions Precautions Precautions: Fall Precaution Comments: PPM, self caths Restrictions Weight Bearing Restrictions: No LLE Weight Bearing: Weight bearing as tolerated     Mobility  Bed Mobility               General bed mobility comments: oob in recliner    Transfers Overall transfer level: Needs assistance Equipment used: Rolling walker (2 wheels) Transfers: Sit to/from Stand Sit to Stand: Min guard           General transfer comment: Min guard for safety. Cues for safety, technique    Ambulation/Gait Ambulation/Gait assistance: Min guard Gait Distance (Feet): 60 Feet Assistive device: Rolling walker  (2 wheels) Gait Pattern/deviations: Decreased step length - right, Decreased step length - left, Decreased stride length       General Gait Details: Very slow gait speed with short steps bilaterally. No LOB with RW. Pt tends to start and stop frequently.   Stairs             Wheelchair Mobility    Modified Rankin (Stroke Patients Only)       Balance Overall balance assessment: Needs assistance         Standing balance support: During functional activity, Reliant on assistive device for balance, Single extremity supported Standing balance-Leahy Scale: Poor                              Cognition Arousal/Alertness: Awake/alert Behavior During Therapy: WFL for tasks assessed/performed Overall Cognitive Status: Within Functional Limits for tasks assessed                                 General Comments: alert but drowsy with slow responses at times        Exercises Total Joint Exercises Ankle Circles/Pumps: AROM, Both, 10 reps Quad Sets: AROM, Both, 10 reps Heel Slides: AAROM, Left, 5 reps Hip ABduction/ADduction: AAROM, Left, 5 reps    General Comments        Pertinent Vitals/Pain Pain Assessment Pain Assessment: Faces Faces Pain Scale: Hurts whole lot Pain Location: L groin/hip Pain Descriptors / Indicators: Aching, Burning, Discomfort Pain Intervention(s): Limited activity within patient's tolerance, Monitored during session, Repositioned, Ice applied    Home Living  Prior Function            PT Goals (current goals can now be found in the care plan section) Progress towards PT goals: Progressing toward goals    Frequency    7X/week      PT Plan Current plan remains appropriate    Co-evaluation              AM-PAC PT "6 Clicks" Mobility   Outcome Measure  Help needed turning from your back to your side while in a flat bed without using bedrails?: A Little Help needed  moving from lying on your back to sitting on the side of a flat bed without using bedrails?: A Little Help needed moving to and from a bed to a chair (including a wheelchair)?: A Little Help needed standing up from a chair using your arms (e.g., wheelchair or bedside chair)?: A Little Help needed to walk in hospital room?: A Little Help needed climbing 3-5 steps with a railing? : A Little 6 Click Score: 18    End of Session Equipment Utilized During Treatment: Gait belt Activity Tolerance: Patient tolerated treatment well;Patient limited by fatigue;Patient limited by pain Patient left: in chair;with call bell/phone within reach;with chair alarm set   PT Visit Diagnosis: Unsteadiness on feet (R26.81);Difficulty in walking, not elsewhere classified (R26.2);Pain Pain - Right/Left: Left Pain - part of body: Hip     Time: 8250-0370 PT Time Calculation (min) (ACUTE ONLY): 18 min  Charges:  $Gait Training: 8-22 mins                        Doreatha Massed, PT Acute Rehabilitation  Office: [phone] Pager: [phone]

## 2022-06-20 NOTE — Progress Notes (Signed)
Physical Therapy Treatment Patient Details Name: [name] MRN: [id number] DOB: Jun 15, 1951 Today's Date: 06/20/2022   History of Present Illness 71 yo  female S/P L THA, direct anterior on 06/18/22.[name] PMH: S/D HF, PPM,neurogenic bladder(self caths), chronic pain, depression, HTN, back surgeries,    PT Comments    Progressing with mobility. Plan is for d/c home on tomorrow.    Recommendations for follow up therapy are one component of a multi-disciplinary discharge planning process, led by the attending physician.  Recommendations may be updated based on patient status, additional functional criteria and insurance authorization.  Follow Up Recommendations  Follow physician's recommendations for discharge plan and follow up therapies     Assistance Recommended at Discharge Intermittent Supervision/Assistance  Patient can return home with the following A little help with bathing/dressing/bathroom;Assistance with cooking/housework;Assist for transportation;Help with stairs or ramp for entrance;A little help with walking and/or transfers   Equipment Recommendations  Rolling walker (2 wheels)    Recommendations for Other Services       Precautions / Restrictions Precautions Precautions: Fall Precaution Comments: PPM, self caths Restrictions Weight Bearing Restrictions: No LLE Weight Bearing: Weight bearing as tolerated     Mobility  Bed Mobility Overal bed mobility: Needs Assistance Bed Mobility: Supine to Sit, Sit to Supine     Supine to sit: HOB elevated, Supervision Sit to supine: HOB elevated, Supervision   General bed mobility comments: Pt used gait belt as leg lifter    Transfers Overall transfer level: Needs assistance Equipment used: Rolling walker (2 wheels) Transfers: Sit to/from Stand Sit to Stand: Min guard           General transfer comment: Min guard for safety. Cues for safety, technique    Ambulation/Gait Ambulation/Gait assistance: Min  guard Gait Distance (Feet): 115 Feet Assistive device: Rolling walker (2 wheels) Gait Pattern/deviations: Decreased step length - right, Decreased step length - left, Decreased stride length       General Gait Details: Very slow gait speed with short steps bilaterally. No LOB with RW. Pt tends to start and stop frequently.   Stairs             Wheelchair Mobility    Modified Rankin (Stroke Patients Only)       Balance Overall balance assessment: Needs assistance         Standing balance support: During functional activity, Reliant on assistive device for balance, Single extremity supported Standing balance-Leahy Scale: Poor                              Cognition Arousal/Alertness: Awake/alert Behavior During Therapy: WFL for tasks assessed/performed Overall Cognitive Status: Within Functional Limits for tasks assessed                                 General Comments: alert but drowsy with slow responses at times        Exercises Total Joint Exercises Ankle Circles/Pumps: AROM, Both, 10 reps Quad Sets: AROM, Both, 10 reps Heel Slides: AAROM, Left, 5 reps Hip ABduction/ADduction: AAROM, Left, 5 reps Long Arc Quad: AROM, Left, 10 reps    General Comments        Pertinent Vitals/Pain Pain Assessment Pain Assessment: Faces Faces Pain Scale: Hurts even more Pain Location: L groin/hip Pain Descriptors / Indicators: Aching, Burning, Discomfort Pain Intervention(s): Limited activity within patient's tolerance, Monitored during session, Repositioned  Home Living                          Prior Function            PT Goals (current goals can now be found in the care plan section) Progress towards PT goals: Progressing toward goals    Frequency    7X/week      PT Plan Current plan remains appropriate    Co-evaluation              AM-PAC PT "6 Clicks" Mobility   Outcome Measure  Help needed turning  from your back to your side while in a flat bed without using bedrails?: A Little Help needed moving from lying on your back to sitting on the side of a flat bed without using bedrails?: A Little Help needed moving to and from a bed to a chair (including a wheelchair)?: A Little Help needed standing up from a chair using your arms (e.g., wheelchair or bedside chair)?: A Little Help needed to walk in hospital room?: A Little Help needed climbing 3-5 steps with a railing? : A Little 6 Click Score: 18    End of Session Equipment Utilized During Treatment: Gait belt Activity Tolerance: Patient tolerated treatment well Patient left: in bed;with call bell/phone within reach;with bed alarm set   PT Visit Diagnosis: Unsteadiness on feet (R26.81);Difficulty in walking, not elsewhere classified (R26.2);Pain Pain - Right/Left: Left Pain - part of body: Hip     Time: 1441-1459 PT Time Calculation (min) (ACUTE ONLY): 18 min  Charges:  $Gait Training: 8-22 mins                         Doreatha Massed, PT Acute Rehabilitation  Office: [phone] Pager: [phone]

## 2022-06-20 NOTE — Progress Notes (Signed)
Subjective: 2 Days Post-Op Procedure(s) (LRB): LEFT TOTAL HIP ARTHROPLASTY ANTERIOR APPROACH (Left) Patient reports pain as moderate.    Objective: Vital signs in last 24 hours: Temp:  [98.4 F (36.9 C)-99.5 F (37.5 C)] 98.4 F (36.9 C) (10/07 0359) Pulse Rate:  [97-101] 97 (10/07 0359) Resp:  [18-20] 20 (10/07 0359) BP: (92-111)/(53-58) 104/58 (10/07 0359) SpO2:  [93 %-98 %] 93 % (10/07 0359)  Intake/Output from previous day: 10/06 0701 - 10/07 0700 In: 1526.9 [P.O.:1200; I.V.:301.2; IV Piggyback:25.7] Out: 950 [Urine:950] Intake/Output this shift: No intake/output data recorded.  Recent Labs    06/19/22 0322 06/20/22 0311  HGB 9.1* 8.7*   Recent Labs    06/19/22 0322 06/20/22 0311  WBC 7.9 7.0  RBC 2.98* 2.86*  HCT 28.8* 27.5*  PLT 186 178   Recent Labs    06/19/22 0322  NA 141  K 3.9  CL 108  CO2 26  BUN 25*  CREATININE 1.84*  GLUCOSE 118*  CALCIUM 8.7*   No results for input(s): "LABPT", "INR" in the last 72 hours.  Sensation intact distally Intact pulses distally Dorsiflexion/Plantar flexion intact Incision: dressing C/D/I   Assessment/Plan: 2 Days Post-Op Procedure(s) (LRB): LEFT TOTAL HIP ARTHROPLASTY ANTERIOR APPROACH (Left) Up with therapy Plan for discharge tomorrow Discharge home with home health      Mcarthur Rossetti 06/20/2022, 11:02 AM

## 2022-06-21 LAB — GLUCOSE, CAPILLARY: Glucose-Capillary: 134 mg/dL — ABNORMAL HIGH (ref 70–99)

## 2022-06-21 NOTE — Progress Notes (Signed)
Subjective: 3 Days Post-Op Procedure(s) (LRB): LEFT TOTAL HIP ARTHROPLASTY ANTERIOR APPROACH (Left) Patient reports pain as well controlled  Objective: Vital signs in last 24 hours: Temp:  [98.2 F (36.8 C)-98.9 F (37.2 C)] 98.5 F (36.9 C) (10/08 0625) Pulse Rate:  [92-100] 100 (10/08 0625) Resp:  [14-18] 14 (10/08 0625) BP: (99-142)/(53-85) 102/65 (10/08 0625) SpO2:  [97 %-98 %] 97 % (10/08 0625)  Intake/Output from previous day: 10/07 0701 - 10/08 0700 In: 720 [P.O.:720] Out: -  Intake/Output this shift: Total I/O In: 240 [P.O.:240] Out: -   Recent Labs    06/19/22 0322 06/20/22 0311  HGB 9.1* 8.7*    Recent Labs    06/19/22 0322 06/20/22 0311  WBC 7.9 7.0  RBC 2.98* 2.86*  HCT 28.8* 27.5*  PLT 186 178    Recent Labs    06/19/22 0322  NA 141  K 3.9  CL 108  CO2 26  BUN 25*  CREATININE 1.84*  GLUCOSE 118*  CALCIUM 8.7*    No results for input(s): "LABPT", "INR" in the last 72 hours.  Sensation intact distally Intact pulses distally Dorsiflexion/Plantar flexion intact Incision: dressing C/D/I   Assessment/Plan: 3 Days Post-Op Procedure(s) (LRB): LEFT TOTAL HIP ARTHROPLASTY ANTERIOR APPROACH (Left) Discharge today with home health services      [facility] 06/21/2022, 7:53 AM

## 2022-06-21 NOTE — Plan of Care (Signed)
  Problem: Pain Management: Goal: Pain level will decrease with appropriate interventions Outcome: Progressing   Problem: Skin Integrity: Goal: Will show signs of wound healing Outcome: Progressing

## 2022-06-21 NOTE — Plan of Care (Signed)
Pt to d/c home with family. No needs at this time.  

## 2022-06-21 NOTE — Progress Notes (Signed)
Physical Therapy Treatment Patient Details Name: [name] MRN: [id number] DOB: [birth year] Today's Date: 06/21/2022   History of Present Illness      PT Comments    Progressing with mobility. Reviewed/practiced exercises and gait training. All education completed.    Recommendations for follow up therapy are one component of a multi-disciplinary discharge planning process, led by the attending physician.  Recommendations may be updated based on patient status, additional functional criteria and insurance authorization.  Follow Up Recommendations  Follow physician's recommendations for discharge plan and follow up therapies     Assistance Recommended at Discharge Intermittent Supervision/Assistance  Patient can return home with the following A little help with bathing/dressing/bathroom;Assistance with cooking/housework;Assist for transportation;Help with stairs or ramp for entrance;A little help with walking and/or transfers   Equipment Recommendations  Rolling walker (2 wheels)    Recommendations for Other Services       Precautions / Restrictions Precautions Precautions: Fall Precaution Comments: PPM, self caths Restrictions Weight Bearing Restrictions: No LLE Weight Bearing: Weight bearing as tolerated     Mobility  Bed Mobility Overal bed mobility: Needs Assistance Bed Mobility: Supine to Sit     Supine to sit: Min assist     General bed mobility comments: Pt used gait belt as leg lifter    Transfers Overall transfer level: Needs assistance Equipment used: Rolling walker (2 wheels) Transfers: Sit to/from Stand Sit to Stand: Min guard           General transfer comment: Min guard for safety. Cues for safety, technique    Ambulation/Gait Ambulation/Gait assistance: Min guard Gait Distance (Feet): 115 Feet Assistive device: Rolling walker (2 wheels) Gait Pattern/deviations: Step-through pattern, Decreased stride length       General Gait  Details: Very slow gait speed with short steps bilaterally. No LOB with RW. Pt tends to start and stop frequently.   Stairs             Wheelchair Mobility    Modified Rankin (Stroke Patients Only)       Balance Overall balance assessment: Needs assistance         Standing balance support: During functional activity, Reliant on assistive device for balance, Single extremity supported Standing balance-Leahy Scale: Poor                              Cognition Arousal/Alertness: Awake/alert Behavior During Therapy: WFL for tasks assessed/performed Overall Cognitive Status: Within Functional Limits for tasks assessed                                          Exercises Total Joint Exercises Ankle Circles/Pumps: AROM, Both Quad Sets: AROM, Both, 10 reps Heel Slides: AAROM, Left, 10 reps Hip ABduction/ADduction: AAROM, Left, 10 reps    General Comments        Pertinent Vitals/Pain Pain Assessment Pain Assessment: Faces Faces Pain Scale: Hurts even more Pain Location: L groin/hip Pain Descriptors / Indicators: Aching, Burning, Discomfort Pain Intervention(s): Limited activity within patient's tolerance, Monitored during session, Repositioned    Home Living                          Prior Function            PT Goals (current goals can now be found in the  care plan section) Progress towards PT goals: Progressing toward goals    Frequency    7X/week      PT Plan Current plan remains appropriate    Co-evaluation              AM-PAC PT "6 Clicks" Mobility   Outcome Measure  Help needed turning from your back to your side while in a flat bed without using bedrails?: A Little Help needed moving from lying on your back to sitting on the side of a flat bed without using bedrails?: A Little Help needed moving to and from a bed to a chair (including a wheelchair)?: A Little Help needed standing up from a chair  using your arms (e.g., wheelchair or bedside chair)?: A Little Help needed to walk in hospital room?: A Little Help needed climbing 3-5 steps with a railing? : A Little 6 Click Score: 18    End of Session Equipment Utilized During Treatment: Gait belt Activity Tolerance: Patient tolerated treatment well Patient left: in chair;with call bell/phone within reach;with chair alarm set   PT Visit Diagnosis: Unsteadiness on feet (R26.81);Difficulty in walking, not elsewhere classified (R26.2);Pain Pain - Right/Left: Left Pain - part of body: Hip     Time: 1004-1020 PT Time Calculation (min) (ACUTE ONLY): 16 min  Charges:  $Gait Training: 8-22 mins                         Doreatha Massed, PT Acute Rehabilitation  Office: [phone] Pager: [phone]

## 2022-06-21 NOTE — Discharge Summary (Signed)
Patient ID: [name] MRN: [id number] DOB/AGE: [birth year] 71 y.o.  Admit date: 06/18/2022 Discharge date: 06/21/2022  Admission Diagnoses:  Unilateral primary osteoarthritis, left hip  Discharge Diagnoses:  Principal Problem:   Unilateral primary osteoarthritis, left hip Active Problems:   Status post total replacement of left hip   Status post left hip replacement   Past Medical History:  Diagnosis Date   Anxiety    Arthritis    Cardiomyopathy    Cervical high risk HPV (human papillomavirus) test positive 01/30/2015   CHF (congestive heart failure) (HCC)    Chronic alcohol abuse 06/09/2020   Chronic constipation    Chronic kidney disease    Chronic lower back pain    Constipation 01/30/2015   Depression    GERD (gastroesophageal reflux disease)    Heart failure    Chronic combined   Heart murmur    HTN (hypertension)    Hypercholesteremia    Neurogenic bladder    2008 nerve damage s/p back ssurgery   Presence of permanent cardiac pacemaker    Type II diabetes mellitus ([place])     Surgeries: Procedure(s): LEFT TOTAL HIP ARTHROPLASTY ANTERIOR APPROACH on 06/18/2022   Consultants (if any):   Discharged Condition: Improved  Hospital Course: [name] is an 71 y.o. female who was admitted 06/18/2022 with a diagnosis of Unilateral primary osteoarthritis, left hip and went to the operating room on 06/18/2022 and underwent the above named procedures.    She was given perioperative antibiotics:  Anti-infectives (From admission, onward)    Start     Dose/Rate Route Frequency Ordered Stop   06/18/22 1400  ceFAZolin (ANCEF) IVPB 1 g/50 mL premix        1 g 100 mL/hr over 30 Minutes Intravenous Every 6 hours 06/18/22 1220 06/18/22 2030   06/18/22 0600  vancomycin (VANCOCIN) IVPB 1000 mg/200 mL premix        1,000 mg 200 mL/hr over 60 Minutes Intravenous On call to O.R. 06/18/22 0523 06/18/22 0800   06/18/22 0600  ceFAZolin (ANCEF) IVPB 2g/100 mL premix         2 g 200 mL/hr over 30 Minutes Intravenous On call to O.R. 06/18/22 5784 06/18/22 0750     .  She was given sequential compression devices, early ambulation, and appropriate chemoprophylaxis for DVT prophylaxis.  She benefited maximally from the hospital stay and there were no complications.    Recent vital signs:  Vitals:   06/20/22 2105 06/21/22 0625  BP: (!) 142/85 102/65  Pulse: 98 100  Resp: 18 14  Temp: 98.9 F (37.2 C) 98.5 F (36.9 C)  SpO2: 98% 97%    Recent laboratory studies:  Lab Results  Component Value Date   HGB 8.7 (L) 06/20/2022   HGB 9.1 (L) 06/19/2022   HGB 11.6 (L) 06/12/2022   Lab Results  Component Value Date   WBC 7.0 06/20/2022   PLT 178 06/20/2022   Lab Results  Component Value Date   INR 1.2 02/18/2022   Lab Results  Component Value Date   NA 141 06/19/2022   K 3.9 06/19/2022   CL 108 06/19/2022   CO2 26 06/19/2022   BUN 25 (H) 06/19/2022   CREATININE 1.84 (H) 06/19/2022   GLUCOSE 118 (H) 06/19/2022    Discharge Medications:   Allergies as of 06/21/2022   No Known Allergies      Medication List     STOP taking these medications    aspirin EC 81  MG tablet Replaced by: aspirin 81 MG chewable tablet   HYDROcodone-acetaminophen 10-325 MG tablet Commonly known as: NORCO       TAKE these medications    aspirin 81 MG chewable tablet Chew 1 tablet (81 mg total) by mouth 2 (two) times daily. Replaces: aspirin EC 81 MG tablet   buPROPion 300 MG 24 hr tablet Commonly known as: WELLBUTRIN XL Take 300 mg by mouth daily.   Bydureon BCise 2 MG/0.85ML Auij Generic drug: Exenatide ER Inject 2 mg into the skin every 'Sunday.   carvedilol 12.5 MG tablet Commonly known as: COREG Take 12.5 mg by mouth 2 (two) times daily.   clonazePAM 1 MG tablet Commonly known as: KLONOPIN Take 1 mg by mouth daily as needed for anxiety.   furosemide 40 MG tablet Commonly known as: LASIX Take 40 mg by mouth every other day.    HumaLOG KwikPen 100 UNIT/ML KwikPen Generic drug: insulin lispro Inject 3 Units into the skin 3 (three) times daily. Per sliding scale   insulin glargine 100 UNIT/ML injection Commonly known as: LANTUS Inject 6 Units into the skin every morning.   losartan 25 MG tablet Commonly known as: COZAAR Take 12.5 mg by mouth daily.   meloxicam 7.5 MG tablet Commonly known as: Mobic Take 1 tablet (7.5 mg total) by mouth daily.   omeprazole 20 MG capsule Commonly known as: PRILOSEC Take 20 mg by mouth daily.   oxyCODONE 5 MG immediate release tablet Commonly known as: Oxy IR/ROXICODONE Take 1-2 tablets (5-10 mg total) by mouth every 4 (four) hours as needed for moderate pain (pain score 4-6).   potassium chloride SA 20 MEQ tablet Commonly known as: KLOR-CON M Take 20 mEq by mouth daily.   rosuvastatin 40 MG tablet Commonly known as: CRESTOR TAKE ONE (1) TABLET BY MOUTH EVERY DAY (STOP ATORVASTATIN)   sertraline 100 MG tablet Commonly known as: ZOLOFT Take 200 mg by mouth daily.   Systane Balance 0.6 % Soln Generic drug: Propylene Glycol Place 1 drop into both eyes in the morning and at bedtime.   tiZANidine 4 MG tablet Commonly known as: ZANAFLEX Take 1 tablet (4 mg total) by mouth every 6 (six) hours as needed for muscle spasms. Take 1 tablet 3 times a day by oral route as needed. Do not drink alcohol or drive while taking this medication. May cause drowsiness What changed:  how much to take how to take this when to take this reasons to take this   zolpidem 10 MG tablet Commonly known as: AMBIEN Take 10 mg by mouth at bedtime.               Durable Medical Equipment  (From admission, onward)           Start     Ordered   06/18/22 1703  For home use only DME Walker rolling  Once       Comments: youth  Question Answer Comment  Walker: With 5 Inch Wheels   Patient needs a walker to treat with the following condition Difficulty in walking, not elsewhere  classified      06/18/22 1702   06/18/22 1221  DME Walker rolling  Once       Question Answer Comment  Walker: With 5 Inch Wheels   Patient needs a walker to treat with the following condition Status post total replacement of left hip      10'$ /05/23 1220   06/18/22 1221  DME 3 n 1  Once  06/18/22 1220            Diagnostic Studies: DG Pelvis Portable  Result Date: 06/18/2022 CLINICAL DATA:  Status post total replacement of left hip EXAM: PORTABLE PELVIS 1 VIEWS COMPARISON:  Pelvic radiograph dated February 17, 2022 FINDINGS: Interval postsurgical changes from left total hip arthroplasty. Arthroplasty components appear in their expected alignment. No periprosthetic fracture is identified. Expected postoperative changes within the overlying soft tissues. IMPRESSION: Postsurgical changes from left total hip arthroplasty Electronically Signed   By: Yetta Glassman M.D.   On: 06/18/2022 10:16   DG HIP UNILAT WITH PELVIS 1V LEFT  Result Date: 06/18/2022 CLINICAL DATA:  Intraoperative fluoroscopy for total left hip arthroplasty. EXAM: DG HIP (WITH OR WITHOUT PELVIS) 1V*L* COMPARISON:  Pelvis and left hip radiographs 03/13/2020, CT left hip 05/12/2022 FINDINGS: Images were performed intraoperatively without the presence of a radiologist. New total left hip arthroplasty. No evidence of hardware complication. Total fluoroscopy images: 3 Total fluoroscopy time: 18 seconds Total dose: Radiation Exposure Index (as provided by the fluoroscopic device): 2.295 mGy air Kerma Please see intraoperative findings for further detail. IMPRESSION: Intraoperative fluoroscopy for total left hip arthroplasty. Electronically Signed   By: Yvonne Kendall M.D.   On: 06/18/2022 08:58   DG C-Arm 1-60 Min-No Report  Result Date: 06/18/2022 Fluoroscopy was utilized by the requesting physician.  No radiographic interpretation.    Disposition:      Follow-up Information     Mcarthur Rossetti, MD Follow up in 2  week(s).   Specialty: Orthopedic Surgery Contact information: [address] [place] [address] [phone]                  Signed: Vanetta Mulders 06/21/2022, 7:53 AM

## 2022-06-21 NOTE — Progress Notes (Signed)
Pt stable at time of d/c instructions and education. Pt dressing clean, dry, and intact. Family at bedside at time of d/c.

## 2022-06-22 ENCOUNTER — Encounter (HOSPITAL_COMMUNITY): Payer: Self-pay | Admitting: Orthopaedic Surgery

## 2022-06-23 ENCOUNTER — Telehealth: Payer: Self-pay | Admitting: *Deleted

## 2022-06-23 DIAGNOSIS — Z471 Aftercare following joint replacement surgery: Secondary | ICD-10-CM | POA: Diagnosis not present

## 2022-06-23 NOTE — Telephone Encounter (Signed)
Spoke with patient today and she is doing well overall at home, but noted her BP remains on the lower side. Has resumed her BP meds at home. Latest BP she states was 90/50. We discussed staying hydrated and also getting up to side up bed and sitting for a few minutes before standing and walking. Any further recommendations?

## 2022-06-24 NOTE — Telephone Encounter (Signed)
Called and updated patient this morning on instructions from MD.

## 2022-06-25 ENCOUNTER — Encounter (HOSPITAL_COMMUNITY): Payer: Self-pay

## 2022-06-25 ENCOUNTER — Other Ambulatory Visit: Payer: Self-pay

## 2022-06-25 ENCOUNTER — Inpatient Hospital Stay (HOSPITAL_COMMUNITY)
Admission: EM | Admit: 2022-06-25 | Discharge: 2022-06-27 | DRG: 315 | Disposition: A | Discharge status: Home Health | Payer: Medicare Other | Attending: Internal Medicine | Admitting: Internal Medicine

## 2022-06-25 ENCOUNTER — Telehealth: Payer: Self-pay | Admitting: *Deleted

## 2022-06-25 ENCOUNTER — Emergency Department (HOSPITAL_COMMUNITY): Payer: Medicare Other

## 2022-06-25 DIAGNOSIS — Z1611 Resistance to penicillins: Secondary | ICD-10-CM | POA: Diagnosis present

## 2022-06-25 DIAGNOSIS — I959 Hypotension, unspecified: Secondary | ICD-10-CM | POA: Diagnosis not present

## 2022-06-25 DIAGNOSIS — R7401 Elevation of levels of liver transaminase levels: Secondary | ICD-10-CM | POA: Diagnosis not present

## 2022-06-25 DIAGNOSIS — N179 Acute kidney failure, unspecified: Secondary | ICD-10-CM | POA: Diagnosis not present

## 2022-06-25 DIAGNOSIS — E861 Hypovolemia: Secondary | ICD-10-CM | POA: Diagnosis present

## 2022-06-25 DIAGNOSIS — N184 Chronic kidney disease, stage 4 (severe): Secondary | ICD-10-CM | POA: Diagnosis not present

## 2022-06-25 DIAGNOSIS — M545 Low back pain, unspecified: Secondary | ICD-10-CM | POA: Diagnosis present

## 2022-06-25 DIAGNOSIS — I950 Idiopathic hypotension: Secondary | ICD-10-CM

## 2022-06-25 DIAGNOSIS — I5042 Chronic combined systolic (congestive) and diastolic (congestive) heart failure: Secondary | ICD-10-CM | POA: Diagnosis present

## 2022-06-25 DIAGNOSIS — Z9842 Cataract extraction status, left eye: Secondary | ICD-10-CM

## 2022-06-25 DIAGNOSIS — E46 Unspecified protein-calorie malnutrition: Secondary | ICD-10-CM

## 2022-06-25 DIAGNOSIS — Z9841 Cataract extraction status, right eye: Secondary | ICD-10-CM

## 2022-06-25 DIAGNOSIS — M1612 Unilateral primary osteoarthritis, left hip: Secondary | ICD-10-CM | POA: Diagnosis present

## 2022-06-25 DIAGNOSIS — F32A Depression, unspecified: Secondary | ICD-10-CM | POA: Diagnosis present

## 2022-06-25 DIAGNOSIS — F1010 Alcohol abuse, uncomplicated: Secondary | ICD-10-CM | POA: Diagnosis present

## 2022-06-25 DIAGNOSIS — N189 Chronic kidney disease, unspecified: Secondary | ICD-10-CM

## 2022-06-25 DIAGNOSIS — I429 Cardiomyopathy, unspecified: Secondary | ICD-10-CM | POA: Diagnosis present

## 2022-06-25 DIAGNOSIS — N3000 Acute cystitis without hematuria: Secondary | ICD-10-CM | POA: Diagnosis not present

## 2022-06-25 DIAGNOSIS — Z20822 Contact with and (suspected) exposure to covid-19: Secondary | ICD-10-CM | POA: Diagnosis present

## 2022-06-25 DIAGNOSIS — R0902 Hypoxemia: Secondary | ICD-10-CM | POA: Diagnosis present

## 2022-06-25 DIAGNOSIS — B9620 Unspecified Escherichia coli [E. coli] as the cause of diseases classified elsewhere: Secondary | ICD-10-CM | POA: Diagnosis present

## 2022-06-25 DIAGNOSIS — R4182 Altered mental status, unspecified: Secondary | ICD-10-CM | POA: Diagnosis not present

## 2022-06-25 DIAGNOSIS — E782 Mixed hyperlipidemia: Secondary | ICD-10-CM | POA: Diagnosis present

## 2022-06-25 DIAGNOSIS — K5909 Other constipation: Secondary | ICD-10-CM | POA: Diagnosis present

## 2022-06-25 DIAGNOSIS — Z7982 Long term (current) use of aspirin: Secondary | ICD-10-CM

## 2022-06-25 DIAGNOSIS — Z6824 Body mass index (BMI) 24.0-24.9, adult: Secondary | ICD-10-CM

## 2022-06-25 DIAGNOSIS — G8929 Other chronic pain: Secondary | ICD-10-CM | POA: Diagnosis present

## 2022-06-25 DIAGNOSIS — I130 Hypertensive heart and chronic kidney disease with heart failure and stage 1 through stage 4 chronic kidney disease, or unspecified chronic kidney disease: Secondary | ICD-10-CM | POA: Diagnosis present

## 2022-06-25 DIAGNOSIS — E8809 Other disorders of plasma-protein metabolism, not elsewhere classified: Secondary | ICD-10-CM | POA: Diagnosis present

## 2022-06-25 DIAGNOSIS — Z794 Long term (current) use of insulin: Secondary | ICD-10-CM

## 2022-06-25 DIAGNOSIS — E440 Moderate protein-calorie malnutrition: Secondary | ICD-10-CM | POA: Diagnosis present

## 2022-06-25 DIAGNOSIS — E860 Dehydration: Secondary | ICD-10-CM | POA: Insufficient documentation

## 2022-06-25 DIAGNOSIS — B9689 Other specified bacterial agents as the cause of diseases classified elsewhere: Secondary | ICD-10-CM | POA: Diagnosis present

## 2022-06-25 DIAGNOSIS — Z96642 Presence of left artificial hip joint: Secondary | ICD-10-CM | POA: Diagnosis present

## 2022-06-25 DIAGNOSIS — F419 Anxiety disorder, unspecified: Secondary | ICD-10-CM | POA: Diagnosis present

## 2022-06-25 DIAGNOSIS — Z743 Need for continuous supervision: Secondary | ICD-10-CM | POA: Diagnosis not present

## 2022-06-25 DIAGNOSIS — Z79899 Other long term (current) drug therapy: Secondary | ICD-10-CM

## 2022-06-25 DIAGNOSIS — N319 Neuromuscular dysfunction of bladder, unspecified: Secondary | ICD-10-CM | POA: Diagnosis present

## 2022-06-25 DIAGNOSIS — R87810 Cervical high risk human papillomavirus (HPV) DNA test positive: Secondary | ICD-10-CM | POA: Diagnosis present

## 2022-06-25 DIAGNOSIS — R011 Cardiac murmur, unspecified: Secondary | ICD-10-CM | POA: Diagnosis present

## 2022-06-25 DIAGNOSIS — E1122 Type 2 diabetes mellitus with diabetic chronic kidney disease: Secondary | ICD-10-CM | POA: Diagnosis not present

## 2022-06-25 DIAGNOSIS — R531 Weakness: Secondary | ICD-10-CM | POA: Diagnosis not present

## 2022-06-25 DIAGNOSIS — Z9049 Acquired absence of other specified parts of digestive tract: Secondary | ICD-10-CM

## 2022-06-25 DIAGNOSIS — R748 Abnormal levels of other serum enzymes: Secondary | ICD-10-CM | POA: Diagnosis present

## 2022-06-25 DIAGNOSIS — I5032 Chronic diastolic (congestive) heart failure: Secondary | ICD-10-CM | POA: Diagnosis not present

## 2022-06-25 DIAGNOSIS — E119 Type 2 diabetes mellitus without complications: Secondary | ICD-10-CM

## 2022-06-25 DIAGNOSIS — D631 Anemia in chronic kidney disease: Secondary | ICD-10-CM | POA: Diagnosis present

## 2022-06-25 DIAGNOSIS — Z833 Family history of diabetes mellitus: Secondary | ICD-10-CM

## 2022-06-25 DIAGNOSIS — I129 Hypertensive chronic kidney disease with stage 1 through stage 4 chronic kidney disease, or unspecified chronic kidney disease: Secondary | ICD-10-CM | POA: Diagnosis not present

## 2022-06-25 DIAGNOSIS — Z95810 Presence of automatic (implantable) cardiac defibrillator: Secondary | ICD-10-CM

## 2022-06-25 DIAGNOSIS — K219 Gastro-esophageal reflux disease without esophagitis: Secondary | ICD-10-CM | POA: Diagnosis present

## 2022-06-25 DIAGNOSIS — Z791 Long term (current) use of non-steroidal anti-inflammatories (NSAID): Secondary | ICD-10-CM

## 2022-06-25 DIAGNOSIS — R6889 Other general symptoms and signs: Secondary | ICD-10-CM | POA: Diagnosis not present

## 2022-06-25 DIAGNOSIS — Z981 Arthrodesis status: Secondary | ICD-10-CM

## 2022-06-25 DIAGNOSIS — Z9851 Tubal ligation status: Secondary | ICD-10-CM

## 2022-06-25 DIAGNOSIS — R404 Transient alteration of awareness: Secondary | ICD-10-CM | POA: Diagnosis not present

## 2022-06-25 DIAGNOSIS — N390 Urinary tract infection, site not specified: Secondary | ICD-10-CM

## 2022-06-25 LAB — COMPREHENSIVE METABOLIC PANEL
ALT: 107 U/L — ABNORMAL HIGH (ref 0–44)
AST: 135 U/L — ABNORMAL HIGH (ref 15–41)
Albumin: 2.8 g/dL — ABNORMAL LOW (ref 3.5–5.0)
Alkaline Phosphatase: 115 U/L (ref 38–126)
Anion gap: 9 (ref 5–15)
BUN: 23 mg/dL (ref 8–23)
CO2: 23 mmol/L (ref 22–32)
Calcium: 9 mg/dL (ref 8.9–10.3)
Chloride: 107 mmol/L (ref 98–111)
Creatinine, Ser: 2.68 mg/dL — ABNORMAL HIGH (ref 0.44–1.00)
GFR, Estimated: 19 mL/min — ABNORMAL LOW (ref >60)
Glucose, Bld: 106 mg/dL — ABNORMAL HIGH (ref 70–99)
Potassium: 4.5 mmol/L (ref 3.5–5.1)
Sodium: 139 mmol/L (ref 135–145)
Total Bilirubin: 0.8 mg/dL (ref 0.3–1.2)
Total Protein: 6.9 g/dL (ref 6.5–8.1)

## 2022-06-25 LAB — HIV ANTIBODY (ROUTINE TESTING W REFLEX): HIV Screen 4th Generation wRfx: NONREACTIVE

## 2022-06-25 LAB — URINALYSIS, ROUTINE W REFLEX MICROSCOPIC
Bilirubin Urine: NEGATIVE
Glucose, UA: NEGATIVE mg/dL
Ketones, ur: NEGATIVE mg/dL
Nitrite: POSITIVE — AB
Protein, ur: 100 mg/dL — AB
Specific Gravity, Urine: 1.008 (ref 1.005–1.030)
pH: 6 (ref 5.0–8.0)

## 2022-06-25 LAB — I-STAT CHEM 8, ED
BUN: 33 mg/dL — ABNORMAL HIGH (ref 8–23)
Calcium, Ion: 1.13 mmol/L — ABNORMAL LOW (ref 1.15–1.40)
Chloride: 106 mmol/L (ref 98–111)
Creatinine, Ser: 2.8 mg/dL — ABNORMAL HIGH (ref 0.44–1.00)
Glucose, Bld: 100 mg/dL — ABNORMAL HIGH (ref 70–99)
HCT: 24 % — ABNORMAL LOW (ref 36.0–46.0)
Hemoglobin: 8.2 g/dL — ABNORMAL LOW (ref 12.0–15.0)
Potassium: 6.7 mmol/L (ref 3.5–5.1)
Sodium: 137 mmol/L (ref 135–145)
TCO2: 24 mmol/L (ref 22–32)

## 2022-06-25 LAB — BLOOD GAS, ARTERIAL
Acid-base deficit: 2.8 mmol/L — ABNORMAL HIGH (ref 0.0–2.0)
Bicarbonate: 21.9 mmol/L (ref 20.0–28.0)
Collection site: 22
Drawn by: 22766
O2 Saturation: 96.9 %
Patient temperature: 36.7
pCO2 arterial: 37 mmHg (ref 32–48)
pH, Arterial: 7.38 (ref 7.35–7.45)
pO2, Arterial: 81 mmHg — ABNORMAL LOW (ref 83–108)

## 2022-06-25 LAB — LACTIC ACID, PLASMA
Lactic Acid, Venous: 1.2 mmol/L (ref 0.5–1.9)
Lactic Acid, Venous: 0.7 mmol/L (ref 0.5–1.9)

## 2022-06-25 LAB — CBC WITH DIFFERENTIAL/PLATELET
Abs Immature Granulocytes: 0.02 10*3/uL (ref 0.00–0.07)
Basophils Absolute: 0 10*3/uL (ref 0.0–0.1)
Basophils Relative: 0 %
Eosinophils Absolute: 0.1 10*3/uL (ref 0.0–0.5)
Eosinophils Relative: 3 %
HCT: 25.8 % — ABNORMAL LOW (ref 36.0–46.0)
Hemoglobin: 8 g/dL — ABNORMAL LOW (ref 12.0–15.0)
Immature Granulocytes: 0 %
Lymphocytes Relative: 21 %
Lymphs Abs: 1.1 10*3/uL (ref 0.7–4.0)
MCH: 29.7 pg (ref 26.0–34.0)
MCHC: 31 g/dL (ref 30.0–36.0)
MCV: 95.9 fL (ref 80.0–100.0)
Monocytes Absolute: 0.5 10*3/uL (ref 0.1–1.0)
Monocytes Relative: 9 %
Neutro Abs: 3.6 10*3/uL (ref 1.7–7.7)
Neutrophils Relative %: 67 %
Platelets: 290 10*3/uL (ref 150–400)
RBC: 2.69 MIL/uL — ABNORMAL LOW (ref 3.87–5.11)
RDW: 13.6 % (ref 11.5–15.5)
WBC: 5.4 10*3/uL (ref 4.0–10.5)
nRBC: 0 % (ref 0.0–0.2)

## 2022-06-25 LAB — TSH: TSH: 1.426 u[IU]/mL (ref 0.350–4.500)

## 2022-06-25 LAB — RESP PANEL BY RT-PCR (FLU A&B, COVID) ARPGX2
Influenza A by PCR: NEGATIVE
Influenza B by PCR: NEGATIVE
SARS Coronavirus 2 by RT PCR: NEGATIVE

## 2022-06-25 LAB — CBG MONITORING, ED
Glucose-Capillary: 97 mg/dL (ref 70–99)
Glucose-Capillary: 80 mg/dL (ref 70–99)

## 2022-06-25 MED ORDER — GLUCERNA SHAKE PO LIQD
237.0000 mL | Freq: Three times a day (TID) | ORAL | Status: DC
  Administered 2022-06-26 – 2022-06-27 (×4): 237 mL via ORAL
  Filled 2022-06-25 (×2): qty 237

## 2022-06-25 MED ORDER — LACTATED RINGERS IV BOLUS
1000.0000 mL | Freq: Once | INTRAVENOUS | Status: AC
  Administered 2022-06-25: 1000 mL via INTRAVENOUS

## 2022-06-25 MED ORDER — SODIUM CHLORIDE 0.9 % IV SOLN
1.0000 g | INTRAVENOUS | Status: DC
  Administered 2022-06-25 – 2022-06-26 (×2): 1 g via INTRAVENOUS
  Filled 2022-06-25 (×2): qty 10

## 2022-06-25 MED ORDER — SODIUM CHLORIDE 0.9 % IV BOLUS
500.0000 mL | Freq: Once | INTRAVENOUS | Status: AC
  Administered 2022-06-25: 500 mL via INTRAVENOUS

## 2022-06-25 MED ORDER — FENTANYL CITRATE PF 50 MCG/ML IJ SOSY
25.0000 ug | PREFILLED_SYRINGE | INTRAMUSCULAR | Status: DC | PRN
  Administered 2022-06-25 – 2022-06-26 (×2): 25 ug via INTRAVENOUS
  Filled 2022-06-25 (×3): qty 1

## 2022-06-25 MED ORDER — MIDODRINE HCL 5 MG PO TABS
5.0000 mg | ORAL_TABLET | Freq: Three times a day (TID) | ORAL | Status: DC
  Administered 2022-06-25 – 2022-06-27 (×5): 5 mg via ORAL
  Filled 2022-06-25 (×5): qty 1

## 2022-06-25 MED ORDER — ENOXAPARIN SODIUM 30 MG/0.3ML IJ SOSY
30.0000 mg | PREFILLED_SYRINGE | INTRAMUSCULAR | Status: DC
  Administered 2022-06-25 – 2022-06-26 (×2): 30 mg via SUBCUTANEOUS
  Filled 2022-06-25 (×2): qty 0.3

## 2022-06-25 MED ORDER — PANTOPRAZOLE SODIUM 40 MG PO TBEC
40.0000 mg | DELAYED_RELEASE_TABLET | Freq: Every day | ORAL | Status: DC
  Administered 2022-06-25 – 2022-06-27 (×3): 40 mg via ORAL
  Filled 2022-06-25 (×3): qty 1

## 2022-06-25 MED ORDER — LACTATED RINGERS IV SOLN: INTRAVENOUS | Status: DC

## 2022-06-25 MED ORDER — INSULIN ASPART 100 UNIT/ML IJ SOLN
0.0000 [IU] | Freq: Three times a day (TID) | INTRAMUSCULAR | Status: DC
  Administered 2022-06-26 (×3): 2 [IU] via SUBCUTANEOUS
  Administered 2022-06-27: 1 [IU] via SUBCUTANEOUS
  Administered 2022-06-27: 2 [IU] via SUBCUTANEOUS

## 2022-06-25 NOTE — ED Triage Notes (Signed)
Pt arrived from home via [facility] due to dizziness and hypotension during physical therapy. Pt had recent left hip surgery and says bp has been low since surgery. Says she oxycodone at 11am today. No falls noted

## 2022-06-25 NOTE — ED Notes (Signed)
Pt gone to CT 

## 2022-06-25 NOTE — ED Provider Notes (Signed)
[facility] EMERGENCY DEPARTMENT Provider Note   CSN: [id number] Arrival date & time: 06/25/22  1158     History  Chief Complaint  Patient presents with   Hypotension    [name] is a 71 y.o. female.  HPI 71 year old female presents with hypotension and fatigue.  She has been feeling tired and had a low blood pressure since she had her left hip replaced about a week ago.  She had low blood pressures in the hospital and was told to stop her blood pressure meds.  She has not had any chest pain or shortness of breath.  Her left hip hurts and seems to be swollen near the surgical site.  No fevers.  She has had a headache for the last 4 days or so.  Friend at the bedside states she is also been confused.  She is currently taking oxycodone as prescribed.  Blood pressure was in the 80s at home and has been low, sometimes as low as the 60s over the past several days. She hasn't been eating well but has been drinking a lot of fluids.  Home Medications Prior to Admission medications   Medication Sig Start Date End Date Taking? Authorizing Provider  aspirin 81 MG chewable tablet Chew 1 tablet (81 mg total) by mouth 2 (two) times daily. 06/20/22   Mcarthur Rossetti, MD  buPROPion (WELLBUTRIN XL) 300 MG 24 hr tablet Take 300 mg by mouth daily. 06/19/20   [provider]  BYDUREON BCISE 2 MG/0.85ML AUIJ Inject 2 mg into the skin every Sunday. 02/02/22   [provider]  carvedilol (COREG) 12.5 MG tablet Take 12.5 mg by mouth 2 (two) times daily. 02/02/22   [provider]  clonazePAM (KLONOPIN) 1 MG tablet Take 1 mg by mouth daily as needed for anxiety.    [provider]  furosemide (LASIX) 40 MG tablet Take 40 mg by mouth every other day.    [provider]  insulin glargine (LANTUS) 100 UNIT/ML injection Inject 6 Units into the skin every morning.    [provider]  insulin lispro (HUMALOG KWIKPEN) 100 UNIT/ML KwikPen Inject 3 Units  into the skin 3 (three) times daily. Per sliding scale    [provider]  losartan (COZAAR) 25 MG tablet Take 12.5 mg by mouth daily. 06/19/20   [provider]  meloxicam (MOBIC) 7.5 MG tablet Take 1 tablet (7.5 mg total) by mouth daily. 04/16/22   Carole Civil, MD  omeprazole (PRILOSEC) 20 MG capsule Take 20 mg by mouth daily.    [provider]  oxyCODONE (OXY IR/ROXICODONE) 5 MG immediate release tablet Take 1-2 tablets (5-10 mg total) by mouth every 4 (four) hours as needed for moderate pain (pain score 4-6). 06/20/22   Mcarthur Rossetti, MD  potassium chloride SA (K-DUR,KLOR-CON) 20 MEQ tablet Take 20 mEq by mouth daily.    [provider]  Propylene Glycol (SYSTANE BALANCE) 0.6 % SOLN Place 1 drop into both eyes in the morning and at bedtime.    [provider]  rosuvastatin (CRESTOR) 40 MG tablet TAKE ONE (1) TABLET BY MOUTH EVERY DAY (STOP ATORVASTATIN) 01/29/20   Corum, Rex Kras, MD  sertraline (ZOLOFT) 100 MG tablet Take 200 mg by mouth daily.    [provider]  tiZANidine (ZANAFLEX) 4 MG tablet Take 1 tablet (4 mg total) by mouth every 6 (six) hours as needed for muscle spasms. Take 1 tablet 3 times a day by oral  route as needed. Do not drink alcohol or drive while taking this medication. May cause drowsiness 06/20/22   Mcarthur Rossetti, MD  zolpidem (AMBIEN) 10 MG tablet Take 10 mg by mouth at bedtime. 12/04/21   [provider]      Allergies    Patient has no known allergies.    Review of Systems   Review of Systems  Constitutional:  Positive for fatigue. Negative for fever.  Respiratory:  Negative for shortness of breath.   Cardiovascular:  Negative for chest pain.  Gastrointestinal:  Negative for abdominal pain, diarrhea and vomiting.  Neurological:  Positive for headaches. Negative for weakness and numbness.    Physical Exam Updated Vital Signs BP (!) 100/56   Pulse 77   Temp 98.1 F (36.7 C)  (Oral)   Resp 15   Ht '5\' 1"'$  (1.549 m)   Wt 58.1 kg   SpO2 94%   BMI 24.19 kg/m  Physical Exam Vitals and nursing note reviewed.  Constitutional:      Appearance: She is well-developed.  HENT:     Head: Normocephalic and atraumatic.  Eyes:     Extraocular Movements: Extraocular movements intact.     Pupils: Pupils are equal, round, and reactive to light.  Cardiovascular:     Rate and Rhythm: Normal rate and regular rhythm.     Heart sounds: Normal heart sounds.  Pulmonary:     Effort: Pulmonary effort is normal.     Breath sounds: Normal breath sounds.  Abdominal:     Palpations: Abdomen is soft.     Tenderness: There is no abdominal tenderness.  Musculoskeletal:     Cervical back: Normal range of motion. No rigidity.  Skin:    General: Skin is warm and dry.  Neurological:     Mental Status: She is alert. She is disoriented.     Comments: CN 3-12 grossly intact. 5/5 strength in RUE, RLE, LUE. Limited movement in LLE due to pain.     ED Results / Procedures / Treatments   Labs (all labs ordered are listed, but only abnormal results are displayed) Labs Reviewed  BLOOD GAS, ARTERIAL - Abnormal; Notable for the following components:      Result Value   pO2, Arterial 81 (*)    Acid-base deficit 2.8 (*)    All other components within normal limits  COMPREHENSIVE METABOLIC PANEL - Abnormal; Notable for the following components:   Glucose, Bld 106 (*)    Creatinine, Ser 2.68 (*)    Albumin 2.8 (*)    AST 135 (*)    ALT 107 (*)    GFR, Estimated 19 (*)    All other components within normal limits  CBC WITH DIFFERENTIAL/PLATELET - Abnormal; Notable for the following components:   RBC 2.69 (*)    Hemoglobin 8.0 (*)    HCT 25.8 (*)    All other components within normal limits  I-STAT CHEM 8, ED - Abnormal; Notable for the following components:   Potassium 6.7 (*)    BUN 33 (*)    Creatinine, Ser 2.80 (*)    Glucose, Bld 100 (*)    Calcium, Ion 1.13 (*)    Hemoglobin  8.2 (*)    HCT 24.0 (*)    All other components within normal limits  RESP PANEL BY RT-PCR (FLU A&B, COVID) ARPGX2  LACTIC ACID, PLASMA  URINALYSIS, ROUTINE W REFLEX MICROSCOPIC  LACTIC ACID, PLASMA  CBG MONITORING, ED    EKG EKG Interpretation  Date/Time:  Thursday June 25 2022 13:52:28 EDT Ventricular Rate:  81 PR Interval:  148 QRS Duration: 144 QT Interval:  426 QTC Calculation: 495 R Axis:   80 Text Interpretation: Sinus rhythm Nonspecific intraventricular conduction delay Lateral infarct, old similar to June 2023 Confirmed by Sherwood Gambler [phone]) on 06/25/2022 2:23:04 PM  Radiology CT Head Wo Contrast  Result Date: 06/25/2022 CLINICAL DATA:  Mental status change. EXAM: CT HEAD WITHOUT CONTRAST TECHNIQUE: Contiguous axial images were obtained from the base of the skull through the vertex without intravenous contrast. RADIATION DOSE REDUCTION: This exam was performed according to the departmental dose-optimization program which includes automated exposure control, adjustment of the mA and/or kV according to patient size and/or use of iterative reconstruction technique. COMPARISON:  02/17/22 CT Brain FINDINGS: Brain: No evidence of acute infarction, hemorrhage, hydrocephalus, extra-axial collection or mass lesion/mass effect. Vascular: No hyperdense vessel or unexpected calcification. Skull: Normal. Negative for fracture or focal lesion. Sinuses/Orbits: Bilateral lens replacement. Minimal mucosal thickening bilateral ethmoid sinuses. Other: None. IMPRESSION: No acute intracranial abnormality. Electronically Signed   By: Marin Roberts M.D.   On: 06/25/2022 14:48   DG Chest Portable 1 View  Result Date: 06/25/2022 CLINICAL DATA:  Weakness EXAM: PORTABLE CHEST 1 VIEW COMPARISON:  02/17/2022 FINDINGS: The heart size and mediastinal contours are within normal limits. Left chest multi lead pacer defibrillator. Both lungs are clear. The visualized skeletal structures are unremarkable.  IMPRESSION: No acute abnormality of the lungs in AP portable projection. Electronically Signed   By: Delanna Ahmadi M.D.   On: 06/25/2022 13:27    Procedures .Critical Care  Performed by: Sherwood Gambler, MD Authorized by: Sherwood Gambler, MD   Critical care provider statement:    Critical care time (minutes):  30   Critical care time was exclusive of:  Separately billable procedures and treating other patients   Critical care was necessary to treat or prevent imminent or life-threatening deterioration of the following conditions:  Circulatory failure   Critical care was time spent personally by me on the following activities:  Development of treatment plan with patient or surrogate, discussions with consultants, evaluation of patient's response to treatment, examination of patient, ordering and review of laboratory studies, ordering and review of radiographic studies, ordering and performing treatments and interventions, pulse oximetry, re-evaluation of patient's condition and review of old charts     Medications Ordered in ED Medications  lactated ringers infusion (has no administration in time range)  lactated ringers bolus 1,000 mL (1,000 mLs Intravenous New Bag/Given 06/25/22 1338)    ED Course/ Medical Decision Making/ A&P                           Medical Decision Making Amount and/or Complexity of Data Reviewed Labs: ordered.    Details: Acute on chronic kidney injury.  No respiratory acidosis or lactic acidosis.  Initial K of 6.7 was a false positive on the Chem-8.  Mild LFT abnormality of unclear etiology, perhaps hypotension related. Radiology: ordered and independent interpretation performed.    Details: No head bleed on head CT.  No pneumonia on chest x-ray ECG/medicine tests: ordered and independent interpretation performed.    Details: No acute ischemia.  Risk Prescription drug management.   Patient presents with hypotension.  She is a little sleepy which I wonder  if is too strong of a narcotic dose in the setting of acute on chronic kidney disease.  She was given bolus of IV  fluids and had good response with her blood pressure.  However even now she is barely normal with 100/56.  She reports she stopped her blood pressure meds though her most recent discharge summary did not indicate that they had stopped it from an orthopedic perspective.  No obvious sign of infection.  I think she would benefit from continued fluids/hydration and rechecking her creatinine in the morning.  We will put her on fluids.  Discussed with Dr. Josephine Cables for admission.        Final Clinical Impression(s) / ED Diagnoses Final diagnoses:  Hypotension, unspecified hypotension type  Acute kidney injury superimposed on chronic kidney disease [facility])    Rx / DC Orders ED Discharge Orders     None         Sherwood Gambler, MD 06/25/22 1555

## 2022-06-25 NOTE — Care Plan (Signed)
[facility] RNCM received call from [facility] liaison stating treating therapist was in home with patient, who was having decreased BP (82/50), decreased sats at low 80s at rest and lethargy. RNCM call to patient's home and spoke with her. She states she feels very bad. No pain, chest pain or SOB reported. Therapist still in home and reports hands/feet are very cold, she has attempted to warm them, but O2 Sats remain in low 80s. BP meds still have not been taken per patient due to decreased BP since surgery. Instructed patient and therapist to call 911 and proceed to ED for treatment. Dr. Ninfa Linden is aware.

## 2022-06-25 NOTE — Telephone Encounter (Signed)
Ortho bundle patient call. See care plan note.

## 2022-06-25 NOTE — ED Notes (Signed)
Pt assisted in bed to eat dinner tray; pt given salt upon request

## 2022-06-25 NOTE — ED Notes (Signed)
Pt given cup of water; pt lying in bed with eyes closed

## 2022-06-25 NOTE — ED Notes (Signed)
Pt self cathes; pt given catheter and urinal and assistance given as needed; pt received dinner tray and set up for patient

## 2022-06-25 NOTE — H&P (Signed)
History and Physical    Patient: [name] HEN:[id number] DOB: April 27, 1951 DOA: 06/25/2022 DOS: the patient was seen and examined on 06/25/2022 PCP: Celene Squibb, MD  Patient coming from: Home  Chief Complaint:  Chief Complaint  Patient presents with   Hypotension   HPI: [name] is a 71 y.o. female with medical history significant of hypertension, hyperlipidemia, GERD, T6RW, diastolic CHF, neurogenic bladder who presents to the emergency department via EMS due to 1 week onset of low blood pressure and fatigue.  Patient had left total hip arthroplasty on 06/18/2022 due to unilateral primary osteoarthritis of left hip at [facility] long by Dr. Ninfa Linden.  Patient complained of developing low blood pressure prior to discharge and BP meds were stopped.  She continued to have low blood pressure while at home and she complained of weakness, fatigue, headache.  SBP was in the 80s at home and sometimes it drops to 60s.  She takes oxycodone for left hip pain, she has been drinking a lot of fluid, though she has not been eating much.  Patient self catheterizes up to 8 times daily due to history of neurogenic bladder.  She denies chest pain, shortness of breath, nausea, vomiting, fever, chills, abdominal pain, burning sensation on urination or any other irritative bladder symptoms.  ED Course:  In the emergency department, BP was 81/53, but other vital signs were within normal range.  Work-up in the ED showed normocytic anemia normal BMP except for creatinine of 2.68 (baseline creatinine at 1.3-1.8) and blood glucose of 106.  Albumin 2.8, AST 135, ALT 107.  Lactic acid 1.2 > 0.7.  Urinalysis was positive for moderate leukocytes and nitrites.  Influenza A, B, SARS coronavirus 2 was negative. CT head without contrast showed no acute intracranial abnormality Chest x-ray showed no acute abnormality of the lungs in AP portable projection IV hydration was provided.  Hospitalist was asked to admit patient  for further evaluation and management.  Review of Systems: Review of systems as noted in the HPI. All other systems reviewed and are negative.   Past Medical History:  Diagnosis Date   Anxiety    Arthritis    Cardiomyopathy    Cervical high risk HPV (human papillomavirus) test positive 01/30/2015   CHF (congestive heart failure) (HCC)    Chronic alcohol abuse 06/09/2020   Chronic constipation    Chronic kidney disease    Chronic lower back pain    Constipation 01/30/2015   Depression    GERD (gastroesophageal reflux disease)    Heart failure    Chronic combined   Heart murmur    HTN (hypertension)    Hypercholesteremia    Neurogenic bladder    2008 nerve damage s/p back ssurgery   Presence of permanent cardiac pacemaker    Type II diabetes mellitus [facility])    Past Surgical History:  Procedure Laterality Date   ACHILLES TENDON SURGERY Right 06/14/2012   BACK SURGERY  09/15/2003   BI-VENTRICULAR IMPLANTABLE CARDIOVERTER DEFIBRILLATOR  (CRT-D)  12/18/2013   upgrade of previously implanted dual chamber pacemaker to MDT CRTD with RV lead revision by Dr Placido Sou PACEMAKER UPGRADE  12/18/2013   w/defibrillator   BIV PACEMAKER GENERATOR CHANGE OUT N/A 12/18/2013   Procedure: BIV PACEMAKER GENERATOR CHANGE OUT;  Surgeon: Evans Lance, MD;  Location: [facility] CATH LAB;  Service: Cardiovascular;  Laterality: N/A;   BIV PACEMAKER INSERTION CRT-P N/A 02/22/2020   Procedure: DOWNGRADE BIV PACEMAKER INSERTION CRT-P;  Surgeon: Cristopher Peru  W, MD;  Location: [place] CV LAB;  Service: Cardiovascular;  Laterality: N/A;   BREAST BIOPSY Left 09/15/1971   benign   BREAST LUMPECTOMY Left 09/15/1971   CATARACT EXTRACTION Bilateral    CESAREAN SECTION  09/15/1979   CHOLECYSTECTOMY     GASTROCNEMIUS RECESSION  06/24/2012   Procedure: GASTROCNEMIUS SLIDE;  Surgeon: Colin Rhein, MD;  Location: [facility] ORS;  Service: Orthopedics;  Laterality: Right;   HERNIA REPAIR     LACERATION  REPAIR Left 02/17/2022   Procedure: REPAIR LEFT CHEEK COMPLEX LACERATION WITH MYRIAD PLACEMENT;  Surgeon: Wallace Going, DO;  Location: [place];  Service: Plastics;  Laterality: Left;   LEAD REVISION N/A 12/18/2013   Procedure: LEAD REVISION;  Surgeon: Evans Lance, MD;  Location: [facility] CATH LAB;  Service: Cardiovascular;  Laterality: N/A;   PACEMAKER INSERTION  09/14/2004   Dual chamber MDT pacemaker implanted - subsequent upgrade to CRTD 12-2013   POSTERIOR LUMBAR FUSION  2006; 2008   SIGMOIDOSCOPY  MAY 2011 w/ PROP   iTCS-->FSIG-poor bowel prep   SKIN FULL THICKNESS GRAFT Left 04/06/2022   Procedure: SKIN GRAFT FULL THICKNESS;  Surgeon: Wallace Going, DO;  Location: [place];  Service: Plastics;  Laterality: Left;   TOTAL HIP ARTHROPLASTY Left 06/18/2022   Procedure: LEFT TOTAL HIP ARTHROPLASTY ANTERIOR APPROACH;  Surgeon: Mcarthur Rossetti, MD;  Location: [facility] ORS;  Service: Orthopedics;  Laterality: Left;   TUBAL LIGATION  ~ 1984   UPPER GASTROINTESTINAL ENDOSCOPY  MAY 2011 w/ PROP   MILD GASTRITIS 2o ETOH   WISDOM TOOTH EXTRACTION      Social History:  reports that she has never smoked. She has never used smokeless tobacco. She reports current alcohol use of about 6.0 standard drinks of alcohol per week. She reports that she does not use drugs.   No Known Allergies  Family History  Problem Relation Age of Onset   Alzheimer's disease Mother    Diabetes Mother    Cancer Father    Cancer Sister        breast   Diabetes Sister    Diabetes Brother    Brain cancer Brother    Diabetes Brother    Colon cancer Neg Hx    Colon polyps Neg Hx      Prior to Admission medications   Medication Sig Start Date End Date Taking? Authorizing Provider  aspirin 81 MG chewable tablet Chew 1 tablet (81 mg total) by mouth 2 (two) times daily. 06/20/22  Yes Mcarthur Rossetti, MD  buPROPion (WELLBUTRIN XL) 300 MG 24 hr tablet Take 300 mg by mouth daily. 06/19/20   Yes [provider]  carvedilol (COREG) 12.5 MG tablet Take 12.5 mg by mouth 2 (two) times daily. 02/02/22  Yes [provider]  clonazePAM (KLONOPIN) 1 MG tablet Take 1 mg by mouth daily as needed for anxiety.   Yes [provider]  furosemide (LASIX) 40 MG tablet Take 40 mg by mouth every other day.   Yes [provider]  gabapentin (NEURONTIN) 600 MG tablet Take 600 mg by mouth at bedtime.   Yes [provider]  HYDROcodone-acetaminophen (NORCO) 10-325 MG tablet Take 1 tablet by mouth every 6 (six) hours as needed for moderate pain. 05/11/22  Yes [provider]  insulin glargine (LANTUS) 100 UNIT/ML injection Inject 6 Units into the skin every morning.   Yes [provider]  insulin lispro (HUMALOG KWIKPEN) 100 UNIT/ML KwikPen Inject 3 Units into  the skin 3 (three) times daily. Per sliding scale   Yes [provider]  losartan (COZAAR) 25 MG tablet Take 12.5 mg by mouth daily. 06/19/20  Yes [provider]  meloxicam (MOBIC) 7.5 MG tablet Take 1 tablet (7.5 mg total) by mouth daily. 04/16/22  Yes Carole Civil, MD  omeprazole (PRILOSEC) 20 MG capsule Take 20 mg by mouth daily.   Yes [provider]  oxyCODONE (OXY IR/ROXICODONE) 5 MG immediate release tablet Take 1-2 tablets (5-10 mg total) by mouth every 4 (four) hours as needed for moderate pain (pain score 4-6). 06/20/22  Yes Mcarthur Rossetti, MD  potassium chloride SA (K-DUR,KLOR-CON) 20 MEQ tablet Take 20 mEq by mouth daily.   Yes [provider]  Propylene Glycol (SYSTANE BALANCE) 0.6 % SOLN Place 1 drop into both eyes in the morning and at bedtime.   Yes [provider]  rosuvastatin (CRESTOR) 40 MG tablet TAKE ONE (1) TABLET BY MOUTH EVERY DAY (STOP ATORVASTATIN) Patient taking differently: Take 40 mg by mouth daily. 01/29/20  Yes Corum, Rex Kras, MD  sertraline (ZOLOFT) 100 MG tablet Take 200 mg by mouth daily.   Yes [provider]  tiZANidine (ZANAFLEX) 4 MG tablet Take 1 tablet (4 mg total) by mouth every 6 (six) hours as needed for muscle spasms. Take 1 tablet 3 times a day by oral route as needed. Do not drink alcohol or drive while taking this medication. May cause drowsiness Patient taking differently: Take 4 mg by mouth at bedtime.  Do not drink alcohol or drive while taking this medication. May cause drowsiness 06/20/22  Yes Mcarthur Rossetti, MD  zolpidem (AMBIEN) 10 MG tablet Take 10 mg by mouth at bedtime. 12/04/21  Yes [provider]    Physical Exam: BP 123/69   Pulse 83   Temp 98.1 F (36.7 C) (Oral)   Resp 17   Ht '5\' 1"'  (1.549 m)   Wt 58.1 kg   SpO2 100%   BMI 24.19 kg/m   General: 71 y.o. year-old female ill appearing, but was in no acute distress.  Alert and oriented x3. HEENT: NCAT, EOMI, dry mucous membrane Neck: Supple, trachea medial Cardiovascular: Regular rate and rhythm with no rubs or gallops.  Left chest multi lead pacer defibrillator insertion site noted. No thyromegaly or JVD noted.  No lower extremity edema. 2/4 pulses in all 4 extremities. Respiratory: Clear to auscultation with no wheezes or rales. Good inspiratory effort. Abdomen: Soft, nontender nondistended with normal bowel sounds x4 quadrants. Muskuloskeletal: No cyanosis, clubbing or edema noted bilaterally Neuro: CN II-XII intact, strength 5/5 x 4, sensation, reflexes intact Skin: No ulcerative lesions noted or rashes Psychiatry: Judgement and insight appear normal. Mood is appropriate for condition and setting          Labs on Admission:  Basic Metabolic Panel: Recent Labs  Lab 06/19/22 0322 06/25/22 1259 06/25/22 1306  NA 141 139 137  K 3.9 4.5 6.7*  CL 108 107 106  CO2 26 23  --   GLUCOSE 118* 106* 100*  BUN 25* 23 33*  CREATININE 1.84* 2.68* 2.80*  CALCIUM 8.7* 9.0  --    Liver Function Tests: Recent Labs  Lab 06/25/22 1259  AST 135*  ALT 107*  ALKPHOS 115  BILITOT 0.8   PROT 6.9  ALBUMIN 2.8*   No results for input(s): "LIPASE", "AMYLASE" in the last 168 hours. No results for input(s): "AMMONIA" in the last 168 hours. CBC: Recent  Labs  Lab 06/19/22 0322 06/20/22 0311 06/25/22 1259 06/25/22 1306  WBC 7.9 7.0 5.4  --   NEUTROABS  --   --  3.6  --   HGB 9.1* 8.7* 8.0* 8.2*  HCT 28.8* 27.5* 25.8* 24.0*  MCV 96.6 96.2 95.9  --   PLT 186 178 290  --    Cardiac Enzymes: No results for input(s): "CKTOTAL", "CKMB", "CKMBINDEX", "TROPONINI" in the last 168 hours.  BNP (last 3 results) No results for input(s): "BNP" in the last 8760 hours.  ProBNP (last 3 results) No results for input(s): "PROBNP" in the last 8760 hours.  CBG: Recent Labs  Lab 06/20/22 1201 06/20/22 1716 06/20/22 2106 06/21/22 0814 06/25/22 1307  GLUCAP 159* 169* 141* 134* 97    Radiological Exams on Admission: CT Head Wo Contrast  Result Date: 06/25/2022 CLINICAL DATA:  Mental status change. EXAM: CT HEAD WITHOUT CONTRAST TECHNIQUE: Contiguous axial images were obtained from the base of the skull through the vertex without intravenous contrast. RADIATION DOSE REDUCTION: This exam was performed according to the departmental dose-optimization program which includes automated exposure control, adjustment of the mA and/or kV according to patient size and/or use of iterative reconstruction technique. COMPARISON:  02/17/22 CT Brain FINDINGS: Brain: No evidence of acute infarction, hemorrhage, hydrocephalus, extra-axial collection or mass lesion/mass effect. Vascular: No hyperdense vessel or unexpected calcification. Skull: Normal. Negative for fracture or focal lesion. Sinuses/Orbits: Bilateral lens replacement. Minimal mucosal thickening bilateral ethmoid sinuses. Other: None. IMPRESSION: No acute intracranial abnormality. Electronically Signed   By: Marin Roberts M.D.   On: 06/25/2022 14:48   DG Chest Portable 1 View  Result Date: 06/25/2022 CLINICAL DATA:  Weakness EXAM: PORTABLE  CHEST 1 VIEW COMPARISON:  02/17/2022 FINDINGS: The heart size and mediastinal contours are within normal limits. Left chest multi lead pacer defibrillator. Both lungs are clear. The visualized skeletal structures are unremarkable. IMPRESSION: No acute abnormality of the lungs in AP portable projection. Electronically Signed   By: Delanna Ahmadi M.D.   On: 06/25/2022 13:27    EKG: I independently viewed the EKG done and my findings are as followed: Normal sinus rhythm at a rate of 81 bpm  Assessment/Plan Present on Admission:  Hypotension  Hypotension  Mixed hyperlipidemia  Active Problems:   Hypotension   Acute kidney injury superimposed on chronic kidney disease (HCC)   Chronic diastolic CHF (congestive heart failure) (HCC)   UTI (urinary tract infection)   Mixed hyperlipidemia   Hypotension   Dehydration   Transaminitis   Hypoalbuminemia due to protein-calorie malnutrition (HCC)   Type 2 diabetes mellitus (HCC)  Hypotension The cause of patient's hypotension is unknown at this time Continue IV hydration Continue midodrine TSH is pending  Acute kidney injury on CKD stage IV Creatinine of 2.68 (baseline creatinine at 1.3-1.8) Renally adjust medications, avoid nephrotoxic agents/dehydration/hypotension  Dehydration Continue IV hydration  Presumed UTI POA Neurogenic bladder Urinalysis was positive for nitrites and moderate leukocytes She self catheterizes about 8 times daily due to neurogenic bladder Patient will be empirically treated with IV ceftriaxone with plan to de-escalate/discontinue based on urine culture Continue self-catheterization  Primary osteoarthritis of left hip status post left hip arthroplasty Continue fall precaution Continue IV fentanyl as needed for pain Continue PT/OT eval and treat  Hypoalbuminemia possibly secondary to moderate protein calorie malnutrition Albumin 2.8 Protein supplement to be provided  Transaminitis AST 135, ALT 107 Continue  to monitor liver enzymes  Chronic diastolic CHF Echocardiogram done on 02/18/2022 showed LVEF 45  to 60%.  LV has normal RWMA.  Mild concentric LVH.  G1 DD. Continue total input/output, daily weights and fluid restriction Continue heart healthy/modified carb diet   GERD Continue Protonix  Essential hypertension BP meds will be held at this time due to hypotension  Mixed hyperlipidemia Crestor will be held at this time due to transaminitis  T2DM Hemoglobin A1c on 06/13/2019 was 7.3 Continue ISS and hypoglycemic protocol  DVT prophylaxis: Lovenox  Code Status: Full code  Consults: None  Family Communication: Sister at bedside (all questions answered to satisfaction)  Severity of Illness: The appropriate patient status for this patient is OBSERVATION. Observation status is judged to be reasonable and necessary in order to provide the required intensity of service to ensure the patient's safety. The patient's presenting symptoms, physical exam findings, and initial radiographic and laboratory data in the context of their medical condition is felt to place them at decreased risk for further clinical deterioration. Furthermore, it is anticipated that the patient will be medically stable for discharge from the hospital within 2 midnights of admission.   Author: Bernadette Hoit, DO 06/25/2022 6:22 PM  For on call review [email or web address].

## 2022-06-26 ENCOUNTER — Encounter (HOSPITAL_COMMUNITY): Payer: Self-pay | Admitting: Internal Medicine

## 2022-06-26 ENCOUNTER — Observation Stay (HOSPITAL_COMMUNITY): Payer: Medicare Other

## 2022-06-26 DIAGNOSIS — I959 Hypotension, unspecified: Secondary | ICD-10-CM | POA: Diagnosis not present

## 2022-06-26 DIAGNOSIS — N179 Acute kidney failure, unspecified: Secondary | ICD-10-CM | POA: Diagnosis not present

## 2022-06-26 DIAGNOSIS — R531 Weakness: Secondary | ICD-10-CM | POA: Diagnosis not present

## 2022-06-26 DIAGNOSIS — I950 Idiopathic hypotension: Secondary | ICD-10-CM | POA: Diagnosis not present

## 2022-06-26 DIAGNOSIS — E1122 Type 2 diabetes mellitus with diabetic chronic kidney disease: Secondary | ICD-10-CM | POA: Diagnosis not present

## 2022-06-26 DIAGNOSIS — D638 Anemia in other chronic diseases classified elsewhere: Secondary | ICD-10-CM

## 2022-06-26 DIAGNOSIS — Z1611 Resistance to penicillins: Secondary | ICD-10-CM | POA: Diagnosis present

## 2022-06-26 DIAGNOSIS — Z6824 Body mass index (BMI) 24.0-24.9, adult: Secondary | ICD-10-CM | POA: Diagnosis not present

## 2022-06-26 DIAGNOSIS — R7401 Elevation of levels of liver transaminase levels: Secondary | ICD-10-CM | POA: Diagnosis not present

## 2022-06-26 DIAGNOSIS — N189 Chronic kidney disease, unspecified: Secondary | ICD-10-CM | POA: Diagnosis not present

## 2022-06-26 DIAGNOSIS — N319 Neuromuscular dysfunction of bladder, unspecified: Secondary | ICD-10-CM | POA: Diagnosis present

## 2022-06-26 DIAGNOSIS — Z794 Long term (current) use of insulin: Secondary | ICD-10-CM | POA: Diagnosis not present

## 2022-06-26 DIAGNOSIS — E860 Dehydration: Secondary | ICD-10-CM | POA: Diagnosis not present

## 2022-06-26 DIAGNOSIS — E46 Unspecified protein-calorie malnutrition: Secondary | ICD-10-CM | POA: Diagnosis not present

## 2022-06-26 DIAGNOSIS — I5032 Chronic diastolic (congestive) heart failure: Secondary | ICD-10-CM | POA: Diagnosis not present

## 2022-06-26 DIAGNOSIS — B9689 Other specified bacterial agents as the cause of diseases classified elsewhere: Secondary | ICD-10-CM | POA: Diagnosis present

## 2022-06-26 DIAGNOSIS — D631 Anemia in chronic kidney disease: Secondary | ICD-10-CM | POA: Diagnosis present

## 2022-06-26 DIAGNOSIS — F32A Depression, unspecified: Secondary | ICD-10-CM | POA: Diagnosis present

## 2022-06-26 DIAGNOSIS — K219 Gastro-esophageal reflux disease without esophagitis: Secondary | ICD-10-CM | POA: Diagnosis present

## 2022-06-26 DIAGNOSIS — I130 Hypertensive heart and chronic kidney disease with heart failure and stage 1 through stage 4 chronic kidney disease, or unspecified chronic kidney disease: Secondary | ICD-10-CM | POA: Diagnosis present

## 2022-06-26 DIAGNOSIS — N184 Chronic kidney disease, stage 4 (severe): Secondary | ICD-10-CM | POA: Diagnosis not present

## 2022-06-26 DIAGNOSIS — Z20822 Contact with and (suspected) exposure to covid-19: Secondary | ICD-10-CM | POA: Diagnosis present

## 2022-06-26 DIAGNOSIS — E861 Hypovolemia: Secondary | ICD-10-CM | POA: Diagnosis present

## 2022-06-26 DIAGNOSIS — N390 Urinary tract infection, site not specified: Secondary | ICD-10-CM | POA: Diagnosis present

## 2022-06-26 DIAGNOSIS — F1010 Alcohol abuse, uncomplicated: Secondary | ICD-10-CM | POA: Diagnosis present

## 2022-06-26 DIAGNOSIS — I5042 Chronic combined systolic (congestive) and diastolic (congestive) heart failure: Secondary | ICD-10-CM | POA: Diagnosis not present

## 2022-06-26 DIAGNOSIS — E782 Mixed hyperlipidemia: Secondary | ICD-10-CM | POA: Diagnosis not present

## 2022-06-26 DIAGNOSIS — I429 Cardiomyopathy, unspecified: Secondary | ICD-10-CM | POA: Diagnosis present

## 2022-06-26 DIAGNOSIS — E8809 Other disorders of plasma-protein metabolism, not elsewhere classified: Secondary | ICD-10-CM | POA: Diagnosis not present

## 2022-06-26 DIAGNOSIS — B9620 Unspecified Escherichia coli [E. coli] as the cause of diseases classified elsewhere: Secondary | ICD-10-CM | POA: Diagnosis present

## 2022-06-26 DIAGNOSIS — N3000 Acute cystitis without hematuria: Secondary | ICD-10-CM | POA: Diagnosis not present

## 2022-06-26 DIAGNOSIS — E440 Moderate protein-calorie malnutrition: Secondary | ICD-10-CM | POA: Diagnosis present

## 2022-06-26 LAB — COMPREHENSIVE METABOLIC PANEL
ALT: 92 U/L — ABNORMAL HIGH (ref 0–44)
AST: 98 U/L — ABNORMAL HIGH (ref 15–41)
Albumin: 2.5 g/dL — ABNORMAL LOW (ref 3.5–5.0)
Alkaline Phosphatase: 101 U/L (ref 38–126)
Anion gap: 8 (ref 5–15)
BUN: 23 mg/dL (ref 8–23)
CO2: 22 mmol/L (ref 22–32)
Calcium: 8.6 mg/dL — ABNORMAL LOW (ref 8.9–10.3)
Chloride: 111 mmol/L (ref 98–111)
Creatinine, Ser: 2.43 mg/dL — ABNORMAL HIGH (ref 0.44–1.00)
GFR, Estimated: 21 mL/min — ABNORMAL LOW (ref >60)
Glucose, Bld: 192 mg/dL — ABNORMAL HIGH (ref 70–99)
Potassium: 3.6 mmol/L (ref 3.5–5.1)
Sodium: 141 mmol/L (ref 135–145)
Total Bilirubin: 0.3 mg/dL (ref 0.3–1.2)
Total Protein: 6 g/dL — ABNORMAL LOW (ref 6.5–8.1)

## 2022-06-26 LAB — GLUCOSE, CAPILLARY
Glucose-Capillary: 173 mg/dL — ABNORMAL HIGH (ref 70–99)
Glucose-Capillary: 175 mg/dL — ABNORMAL HIGH (ref 70–99)
Glucose-Capillary: 180 mg/dL — ABNORMAL HIGH (ref 70–99)
Glucose-Capillary: 158 mg/dL — ABNORMAL HIGH (ref 70–99)

## 2022-06-26 LAB — MAGNESIUM: Magnesium: 2.4 mg/dL (ref 1.7–2.4)

## 2022-06-26 LAB — CBC
HCT: 26 % — ABNORMAL LOW (ref 36.0–46.0)
Hemoglobin: 7.8 g/dL — ABNORMAL LOW (ref 12.0–15.0)
MCH: 29.4 pg (ref 26.0–34.0)
MCHC: 30 g/dL (ref 30.0–36.0)
MCV: 98.1 fL (ref 80.0–100.0)
Platelets: 298 10*3/uL (ref 150–400)
RBC: 2.65 MIL/uL — ABNORMAL LOW (ref 3.87–5.11)
RDW: 13.2 % (ref 11.5–15.5)
WBC: 6 10*3/uL (ref 4.0–10.5)
nRBC: 0 % (ref 0.0–0.2)

## 2022-06-26 LAB — APTT: aPTT: 38 seconds — ABNORMAL HIGH (ref 24–36)

## 2022-06-26 LAB — PHOSPHORUS: Phosphorus: 3.7 mg/dL (ref 2.5–4.6)

## 2022-06-26 MED ORDER — ROSUVASTATIN CALCIUM 20 MG PO TABS
40.0000 mg | ORAL_TABLET | Freq: Every day | ORAL | Status: DC
  Administered 2022-06-26 – 2022-06-27 (×2): 40 mg via ORAL
  Filled 2022-06-26 (×2): qty 2

## 2022-06-26 MED ORDER — ZOLPIDEM TARTRATE 5 MG PO TABS
5.0000 mg | ORAL_TABLET | Freq: Every evening | ORAL | Status: DC | PRN
  Administered 2022-06-27: 5 mg via ORAL
  Filled 2022-06-26: qty 1

## 2022-06-26 MED ORDER — SERTRALINE HCL 50 MG PO TABS
200.0000 mg | ORAL_TABLET | Freq: Every day | ORAL | Status: DC
  Administered 2022-06-26: 200 mg via ORAL
  Filled 2022-06-26: qty 4

## 2022-06-26 MED ORDER — BUPROPION HCL ER (XL) 300 MG PO TB24
300.0000 mg | ORAL_TABLET | Freq: Every day | ORAL | Status: DC
  Administered 2022-06-26 – 2022-06-27 (×2): 300 mg via ORAL
  Filled 2022-06-26 (×2): qty 1

## 2022-06-26 MED ORDER — ORAL CARE MOUTH RINSE: 15.0000 mL | OROMUCOSAL | Status: DC | PRN

## 2022-06-26 MED ORDER — TECHNETIUM TO 99M ALBUMIN AGGREGATED
4.2000 | Freq: Once | INTRAVENOUS | Status: AC | PRN
  Administered 2022-06-26: 4.2 via INTRAVENOUS

## 2022-06-26 NOTE — Progress Notes (Signed)
Progress Note   Patient: [name] DOB: 1950-11-13 DOA: 06/25/2022     0 DOS: the patient was seen and examined on 06/26/2022   Brief hospital course: As per H&P written by Dr. Josephine Cables on 06/25/2022 [name] is a 71 y.o. female with medical history significant of hypertension, hyperlipidemia, GERD, R4ER, diastolic CHF, neurogenic bladder who presents to the emergency department via EMS due to 1 week onset of low blood pressure and fatigue.  Patient had left total hip arthroplasty on 06/18/2022 due to unilateral primary osteoarthritis of left hip at [facility] long by Dr. Ninfa Linden.  Patient complained of developing low blood pressure prior to discharge and BP meds were stopped.  She continued to have low blood pressure while at home and she complained of weakness, fatigue, headache.  SBP was in the 80s at home and sometimes it drops to 60s.  She takes oxycodone for left hip pain, she has been drinking a lot of fluid, though she has not been eating much.  Patient self catheterizes up to 8 times daily due to history of neurogenic bladder.  She denies chest pain, shortness of breath, nausea, vomiting, fever, chills, abdominal pain, burning sensation on urination or any other irritative bladder symptoms.   ED Course:  In the emergency department, BP was 81/53, but other vital signs were within normal range.  Work-up in the ED showed normocytic anemia normal BMP except for creatinine of 2.68 (baseline creatinine at 1.3-1.8) and blood glucose of 106.  Albumin 2.8, AST 135, ALT 107.  Lactic acid 1.2 > 0.7.  Urinalysis was positive for moderate leukocytes and nitrites.  Influenza A, B, SARS coronavirus 2 was negative. CT head without contrast showed no acute intracranial abnormality Chest x-ray showed no acute abnormality of the lungs in AP portable projection IV hydration was provided.  Hospitalist was asked to admit patient for further evaluation and management.  Assessment and  Plan: 1-hypotension -Appears to be secondary to dehydration along with continued use of antihypertensive agents/diuretics and newly added analgesic management. -Continue to be judicious with pain medication -Continue fluid resuscitation -Continue holding antihypertensive agents and diuretics currently -Patient started on low-dose midodrine -Follow response.  2-acute kidney injury on chronic kidney disease stage IV -Patient baseline creatinine 1.3-1.8 -At time of admission creatinine 2.68 -Acute kidney injury most likely prerenal in nature along with continue using nephrotoxic agents and the presence of UTI -After fluid resuscitation, stabilization of blood pressure and initiation treatment with antibiotics renal function responding adequately -Continue to follow trend -Continue minimizing/avoiding the use of nephrotoxic agents and contrast.  3-shortness of breath/hypoxia -With associated history of hypertension and recent orthopedic surgery patient with high risk for PE -VQ scan has been done to rule out abnormalities -Normal results and no pulmonary embolism appreciated.  4-UTI -Follow culture result -Continue empirical therapy with antibiotics -Patient with underlying history of neurogenic bladder and requires at home self-catheterization for voiding purposes (increasing the risk for infection).  5-history of osteoarthritis of left hip status post left hip arthroplasty -Continue fall precaution -Continue as needed analgesics management -Follow recommendation by PT/OT -Patient came from home.  6-chronic combined systolic and diastolic heart failure -Recent echocardiogram in June 2023 demonstrating ejection fraction 45 to 60% without wall motion abnormality and grade 1 diastolic dysfunction. -Continue daily weights, low-sodium diet and strict intake and output -Holding diuretics and home cardiac medications in the setting of hypotension with worsening renal  function.  7-gastroesophageal flux disease -Continue PPI.  8-transaminitis -Mild elevation  most likely in the setting of recent surgical intervention and dehydration -Continue to follow LFTs after hydration.  9-type 2 diabetes mellitus with nephropathy -Recent A1c 7.3 -Continue sliding scale insulin and follow CBGs fluctuation.  10-history of depression/anxiety -Resume the use of BuSpar and Zoloft  11-hyperlipidemia -Continue statin.  12-mainly of chronic kidney disease -No signs of overt bleeding -Hemoglobin 7.8 in the setting of recent surgical intervention and hemodilution from fluid resuscitation -Continue to follow hemoglobin trend. -Transfuse for hemoglobin less than 7.  Subjective:  No chest pain, no nausea, no vomiting.  Reports feeling better and currently no shortness of breath described.  Good saturation on room air appreciated.  Physical Exam: Vitals:   06/26/22 0500 06/26/22 0652 06/26/22 0723 06/26/22 1226  BP:  (!) 149/89 (!) 140/68 (!) 147/62  Pulse:  97 97 94  Resp:  '18 19 17  '$ Temp:   97.8 F (36.6 C) 98.5 F (36.9 C)  TempSrc:    Oral  SpO2:  98% 100% 100%  Weight: 62.1 kg     Height:       General exam: Alert, awake, oriented x 3; reports feeling better, currently no chest pain or complaints of shortness of breath.  Good saturation on room air. Respiratory system: Positive scattered rhonchi; no wheezing or crackles on examination.  No using accessory muscle. Cardiovascular system: Rate controlled, no rubs, no gallops, no JVD. Gastrointestinal system: Abdomen is nondistended, soft and nontender. No organomegaly or masses felt. Normal bowel sounds heard. Central nervous system: Alert and oriented. No focal neurological deficits. Extremities: No cyanosis or clubbing. Skin: No petechiae. Psychiatry: Judgement and insight appear normal. Mood & affect appropriate.   Data Reviewed: Comprehensive metabolic panel: Sodium 034, potassium 3.6, chloride 111,  bicarb 22, BUN 23, creatinine 2.43, AST 98, ALT 92 CBC: WBC 6.0, hemoglobin 7.8, platelet count 298 K Magnesium: 2.4  Family Communication: No family at bedside as per patient request.  Disposition: Status is: Inpatient Remains inpatient appropriate because: Continue fluid resuscitation and treatment for acute on chronic renal failure.   Planned Discharge Destination: Home   Author: Barton Dubois, MD 06/26/2022 4:38 PM  For on call review [email or web address].

## 2022-06-26 NOTE — Evaluation (Signed)
Physical Therapy Evaluation Patient Details Name: [name] MRN: [id number] DOB: 03-06-1951 Today's Date: 06/26/2022  History of Present Illness  [name] is a 71 y.o. female with medical history significant of hypertension, hyperlipidemia, GERD, D9RC, diastolic CHF, neurogenic bladder who presents to the emergency department via EMS due to 1 week onset of low blood pressure and fatigue.  Patient had left total hip arthroplasty on 06/18/2022 due to unilateral primary osteoarthritis of left hip at [facility] long by Dr. Ninfa Linden.  Patient complained of developing low blood pressure prior to discharge and BP meds were stopped.  She continued to have low blood pressure while at home and she complained of weakness, fatigue, headache.  SBP was in the 80s at home and sometimes it drops to 60s.  She takes oxycodone for left hip pain, she has been drinking a lot of fluid, though she has not been eating much.  Patient self catheterizes up to 8 times daily due to history of neurogenic bladder.  She denies chest pain, shortness of breath, nausea, vomiting, fever, chills, abdominal pain, burning sensation on urination or any other irritative bladder symptoms.   Clinical Impression  Patient limited for functional mobility as stated below secondary to L hip pain, LLE weakness, fatigue and impaired standing balance. Patient able to use RLE to assist LLE to transition to seated EOB. She demonstrates good sitting balance and sitting tolerance at bedside. Patient transfers to standing with RW and min G for safety. She ambulates with slow cadence without loss of balance with use of RW and ends session seated in chair. Patient will benefit from continued physical therapy in hospital and recommended venue below to increase strength, balance, endurance for safe ADLs and gait.        Recommendations for follow up therapy are one component of a multi-disciplinary discharge planning process, led by the attending  physician.  Recommendations may be updated based on patient status, additional functional criteria and insurance authorization.  Follow Up Recommendations Home health PT      Assistance Recommended at Discharge Intermittent Supervision/Assistance  Patient can return home with the following  A little help with bathing/dressing/bathroom;Assistance with cooking/housework;Assist for transportation;Help with stairs or ramp for entrance;A little help with walking and/or transfers    Equipment Recommendations None recommended by PT  Recommendations for Other Services       Functional Status Assessment Patient has had a recent decline in their functional status and demonstrates the ability to make significant improvements in function in a reasonable and predictable amount of time.     Precautions / Restrictions Precautions Precautions: Fall Restrictions Weight Bearing Restrictions: No      Mobility  Bed Mobility Overal bed mobility: Needs Assistance Bed Mobility: Supine to Sit     Supine to sit: Min guard     General bed mobility comments: uses RLE to assist LLE    Transfers Overall transfer level: Needs assistance Equipment used: Rolling walker (2 wheels) Transfers: Sit to/from Stand Sit to Stand: Min guard                Ambulation/Gait Ambulation/Gait assistance: Min guard Gait Distance (Feet): 90 Feet Assistive device: Rolling walker (2 wheels) Gait Pattern/deviations: Step-through pattern, Decreased stride length Gait velocity: decreased     General Gait Details: Very slow gait speed with short steps bilaterally. No LOB with RW  Stairs            Wheelchair Mobility    Modified Rankin (Stroke Patients Only)  Balance Overall balance assessment: Needs assistance Sitting-balance support: Bilateral upper extremity supported, Feet supported Sitting balance-Leahy Scale: Good Sitting balance - Comments: seated EOB   Standing balance support:  During functional activity, Reliant on assistive device for balance Standing balance-Leahy Scale: Good Standing balance comment: with RW                             Pertinent Vitals/Pain Pain Assessment Pain Assessment: Faces Faces Pain Scale: Hurts even more Pain Location: L groin/hip Pain Descriptors / Indicators: Aching, Burning, Discomfort Pain Intervention(s): Limited activity within patient's tolerance, Monitored during session, Repositioned    Home Living Family/patient expects to be discharged to:: Private residence Living Arrangements: Spouse/significant other;Non-relatives/Friends Available Help at Discharge: Family Type of Home: House Home Access: Ramped entrance       Home Layout: One level Home Equipment: Grab bars - tub/shower;Rolling Environmental consultant (2 wheels) Additional Comments: Pt's siblings from out of state assisting, also has supportive friends. Pt's husband is dependent and unable to provide assist    Prior Function Prior Level of Function : Independent/Modified Independent               ADLs Comments: primary caregiver for dependent husband, sister assisting since recent THA     Hand Dominance   Dominant Hand: Right    Extremity/Trunk Assessment   Upper Extremity Assessment Upper Extremity Assessment: Defer to OT evaluation    Lower Extremity Assessment Lower Extremity Assessment: LLE deficits/detail LLE: Unable to fully assess due to pain    Cervical / Trunk Assessment Cervical / Trunk Assessment: Normal  Communication   Communication: No difficulties  Cognition Arousal/Alertness: Awake/alert Behavior During Therapy: WFL for tasks assessed/performed Overall Cognitive Status: Within Functional Limits for tasks assessed                                          General Comments      Exercises     Assessment/Plan    PT Assessment Patient needs continued PT services  PT Problem List Decreased  strength;Decreased mobility;Decreased safety awareness;Decreased activity tolerance;Decreased knowledge of use of DME;Decreased knowledge of precautions       PT Treatment Interventions DME instruction;Therapeutic activities;Gait training;Therapeutic exercise;Patient/family education;Stair training;Functional mobility training;Balance training;Manual techniques;Neuromuscular re-education    PT Goals (Current goals can be found in the Care Plan section)  Acute Rehab PT Goals Patient Stated Goal: return home PT Goal Formulation: With patient Time For Goal Achievement: 07/03/22 Potential to Achieve Goals: Good    Frequency Min 3X/week     Co-evaluation PT/OT/SLP Co-Evaluation/Treatment: Yes Reason for Co-Treatment: To address functional/ADL transfers PT goals addressed during session: Mobility/safety with mobility;Balance;Proper use of DME;Strengthening/ROM         AM-PAC PT "6 Clicks" Mobility  Outcome Measure Help needed turning from your back to your side while in a flat bed without using bedrails?: A Little Help needed moving from lying on your back to sitting on the side of a flat bed without using bedrails?: A Little Help needed moving to and from a bed to a chair (including a wheelchair)?: A Little Help needed standing up from a chair using your arms (e.g., wheelchair or bedside chair)?: A Little Help needed to walk in hospital room?: A Little Help needed climbing 3-5 steps with a railing? : A Little 6 Click Score: 18  End of Session Equipment Utilized During Treatment: Gait belt Activity Tolerance: Patient tolerated treatment well Patient left: in chair;with call bell/phone within reach;with nursing/sitter in room Nurse Communication: Mobility status PT Visit Diagnosis: Unsteadiness on feet (R26.81);Difficulty in walking, not elsewhere classified (R26.2);Pain Pain - Right/Left: Left Pain - part of body: Hip    Time: 0352-4818 PT Time Calculation (min) (ACUTE  ONLY): 21 min   Charges:   PT Evaluation $PT Eval Low Complexity: 1 Low          9:51 AM, 06/26/22 Mearl Latin PT, DPT Physical Therapist at [facility]

## 2022-06-26 NOTE — Plan of Care (Signed)
  Problem: Acute Rehab OT Goals (only OT should resolve) Goal: Pt. Will Perform Grooming Flowsheets (Taken 06/26/2022 1012) Pt Will Perform Grooming:  with modified independence  standing Goal: Pt. Will Perform Lower Body Bathing Flowsheets (Taken 06/26/2022 1012) Pt Will Perform Lower Body Bathing:  with modified independence  sitting/lateral leans  with adaptive equipment Goal: Pt. Will Perform Lower Body Dressing Flowsheets (Taken 06/26/2022 1012) Pt Will Perform Lower Body Dressing:  with modified independence  with adaptive equipment  sitting/lateral leans Goal: Pt. Will Transfer To Toilet Flowsheets (Taken 06/26/2022 1012) Pt Will Transfer to Toilet:  with modified independence  ambulating  [name] OT, MOT

## 2022-06-26 NOTE — Plan of Care (Signed)
  Problem: Acute Rehab PT Goals(only PT should resolve) Goal: Patient Will Transfer Sit To/From Stand Outcome: Progressing Flowsheets (Taken 06/26/2022 [id number]) Patient will transfer sit to/from stand: with modified independence Goal: Pt Will Transfer Bed To Chair/Chair To Bed Outcome: Progressing Flowsheets (Taken 06/26/2022 0952) Pt will Transfer Bed to Chair/Chair to Bed: with modified independence Goal: Pt Will Ambulate Outcome: Progressing Flowsheets (Taken 06/26/2022 0952) Pt will Ambulate:  > 125 feet  with modified independence  with rolling walker Goal: Pt/caregiver will Perform Home Exercise Program Outcome: Progressing Flowsheets (Taken 06/26/2022 0952) Pt/caregiver will Perform Home Exercise Program:  For increased strengthening  For improved balance  Independently  9:53 AM, 06/26/22 Mearl Latin PT, DPT Physical Therapist at [facility]

## 2022-06-26 NOTE — TOC Progression Note (Signed)
  Transition of Care [facility]) Screening Note   Patient Details  Name: [name] Date of Birth: Jul 28, 1951   Transition of Care [facility]) CM/SW Contact:    Boneta Lucks, RN Phone Number: 06/26/2022, 10:44 AM    Transition of Care Department [facility]) has reviewed patient and no TOC needs have been identified at this time. We will continue to monitor patient advancement through interdisciplinary progression rounds. If new patient transition needs arise, please place a TOC consult.     Expected Discharge Plan: Home/Self Care Barriers to Discharge: Continued Medical Work up  Expected Discharge Plan and Services Expected Discharge Plan: Home/Self Care

## 2022-06-26 NOTE — Evaluation (Signed)
Occupational Therapy Evaluation Patient Details Name: [name] MRN: [id number] DOB: 04/21/1951 Today's Date: 06/26/2022   History of Present Illness [name] is a 71 y.o. female with medical history significant of hypertension, hyperlipidemia, GERD, T6RW, diastolic CHF, neurogenic bladder who presents to the emergency department via EMS due to 1 week onset of low blood pressure and fatigue.  Patient had left total hip arthroplasty on 06/18/2022 due to unilateral primary osteoarthritis of left hip at [facility] long by Dr. Ninfa Linden.  Patient complained of developing low blood pressure prior to discharge and BP meds were stopped.  She continued to have low blood pressure while at home and she complained of weakness, fatigue, headache.  SBP was in the 80s at home and sometimes it drops to 60s.  She takes oxycodone for left hip pain, she has been drinking a lot of fluid, though she has not been eating much.  Patient self catheterizes up to 8 times daily due to history of neurogenic bladder.  She denies chest pain, shortness of breath, nausea, vomiting, fever, chills, abdominal pain, burning sensation on urination or any other irritative bladder symptoms.   Clinical Impression   Pt agreeable to OT and PT co-evaluation. Pt at level of min G assist for functional ambulation. Due to recent hip procedure pt needs assist for lower body dressing and bathing. Pt recommended for home  health services for education and training on best ways to complete ADL's at this time. Pt reported need for shower chair. Verbal recommended one since pt is not interested in [facility] that could be offered from the acute. Pt will benefit from continued OT in the hospital and recommended venue below to increase strength, balance, and endurance for safe ADL's.         Recommendations for follow up therapy are one component of a multi-disciplinary discharge planning process, led by the attending physician.  Recommendations may be  updated based on patient status, additional functional criteria and insurance authorization.   Follow Up Recommendations  Home health OT    Assistance Recommended at Discharge Intermittent Supervision/Assistance  Patient can return home with the following A little help with walking and/or transfers;A lot of help with bathing/dressing/bathroom;Assistance with cooking/housework;Assist for transportation;Help with stairs or ramp for entrance    Functional Status Assessment  Patient has had a recent decline in their functional status and demonstrates the ability to make significant improvements in function in a reasonable and predictable amount of time.  Equipment Recommendations  Tub/shower seat;Other (comment) (Pt verbally informed to get a shower chair since pt had not interest in [facility] which is waht inscurance covers from acute setting.)    Recommendations for Other Services       Precautions / Restrictions Precautions Precautions: Fall Restrictions Weight Bearing Restrictions: No (Simultaneous filing. User may not have seen previous data.) LLE Weight Bearing: Weight bearing as tolerated      Mobility Bed Mobility Overal bed mobility: Needs Assistance Bed Mobility: Supine to Sit     Supine to sit: Min guard Sit to supine: Supervision   General bed mobility comments: uses RLE to assist LLE    Transfers Overall transfer level: Needs assistance Equipment used: Rolling walker (2 wheels) Transfers: Sit to/from Stand, Bed to chair/wheelchair/BSC Sit to Stand: Min guard     Step pivot transfers: Min guard     General transfer comment: Min G assist for safety. Slow ambualtion.      Balance Overall balance assessment: Needs assistance Sitting-balance support: Bilateral upper  extremity supported, Feet supported Sitting balance-Leahy Scale: Good Sitting balance - Comments: seated EOB   Standing balance support: During functional activity, Reliant on assistive device for  balance Standing balance-Leahy Scale: Good Standing balance comment: with RW                           ADL either performed or assessed with clinical judgement   ADL Overall ADL's : Needs assistance/impaired     Grooming: Min guard;Standing       Lower Body Bathing: Moderate assistance;Maximal assistance;Sitting/lateral leans       Lower Body Dressing: Maximal assistance;Bed level Lower Body Dressing Details (indicate cue type and reason): Max A to don socks at bed level today. Toilet Transfer: Ambulation;Min guard;Rolling walker (2 wheels) Toilet Transfer Details (indicate cue type and reason): simulated via EOb to chair transfer [place] and Hygiene: Min guard;Sitting/lateral lean       Functional mobility during ADLs: Min guard;Rolling walker (2 wheels)       Vision Baseline Vision/History: 1 Wears glasses Ability to See in Adequate Light: 0 Adequate Patient Visual Report: No change from baseline Vision Assessment?: No apparent visual deficits                Pertinent Vitals/Pain Pain Assessment Pain Assessment: Faces Faces Pain Scale: Hurts even more Pain Location: L groin/hip Pain Descriptors / Indicators: Aching, Burning, Discomfort Pain Intervention(s): Limited activity within patient's tolerance, Monitored during session, Repositioned     Hand Dominance Right (per chart; pt reported L today)   Extremity/Trunk Assessment Upper Extremity Assessment Upper Extremity Assessment: Overall WFL for tasks assessed   Lower Extremity Assessment Lower Extremity Assessment: Defer to PT evaluation LLE: Unable to fully assess due to pain   Cervical / Trunk Assessment Cervical / Trunk Assessment: Normal   Communication Communication Communication: No difficulties   Cognition Arousal/Alertness: Awake/alert Behavior During Therapy: WFL for tasks assessed/performed Overall Cognitive Status: Within Functional Limits for tasks  assessed                                                        Home Living Family/patient expects to be discharged to:: Private residence Living Arrangements: Spouse/significant other;Non-relatives/Friends Available Help at Discharge: Family Type of Home: House Home Access: Ramped entrance     Home Layout: One level     Bathroom Shower/Tub: Teacher, early years/pre: Standard     Home Equipment: Grab bars - tub/shower;Rolling Environmental consultant (2 wheels)   Additional Comments: Pt's siblings from out of state assisting, also has supportive friends. Pt's husband is dependent and unable to provide assist (per PT ntoe)      Prior Functioning/Environment Prior Level of Function : Independent/Modified Independent             Mobility Comments: no AD ADLs Comments: primary caregiver for dependent husband, sister assisting since recent THA (per PT note)        OT Problem List: Decreased knowledge of use of DME or AE;Pain;Decreased activity tolerance;Decreased strength      OT Treatment/Interventions: Self-care/ADL training;Therapeutic exercise;DME and/or AE instruction;Therapeutic activities;Patient/family education;Balance training    OT Goals(Current goals can be found in the care plan section) Acute Rehab OT Goals Patient Stated Goal: return home OT Goal Formulation: With patient Time For  Goal Achievement: 07/10/22 Potential to Achieve Goals: Good  OT Frequency: Min 1X/week    Co-evaluation PT/OT/SLP Co-Evaluation/Treatment: Yes Reason for Co-Treatment: To address functional/ADL transfers PT goals addressed during session: Mobility/safety with mobility;Balance;Proper use of DME;Strengthening/ROM OT goals addressed during session: ADL's and self-care      AM-PAC OT "6 Clicks" Daily Activity     Outcome Measure Help from another person eating meals?: None Help from another person taking care of personal grooming?: A Little Help from another  person toileting, which includes using toliet, bedpan, or urinal?: A Little Help from another person bathing (including washing, rinsing, drying)?: A Lot Help from another person to put on and taking off regular upper body clothing?: None Help from another person to put on and taking off regular lower body clothing?: A Lot 6 Click Score: 18   End of Session Equipment Utilized During Treatment: Rolling walker (2 wheels)  Activity Tolerance: Patient tolerated treatment well Patient left: in chair;with call bell/phone within reach;with nursing/sitter in room  OT Visit Diagnosis: Unsteadiness on feet (R26.81);Other abnormalities of gait and mobility (R26.89);Pain Pain - Right/Left: Left Pain - part of body: Hip                Time: 0881-1031 OT Time Calculation (min): 20 min Charges:  OT General Charges $OT Visit: 1 Visit OT Evaluation $OT Eval Low Complexity: 1 Low  Joyleen Haselton OT, MOT  Larey Seat 06/26/2022, 10:07 AM

## 2022-06-26 NOTE — Progress Notes (Signed)
Patient off unit for radiology imaging

## 2022-06-27 DIAGNOSIS — I5032 Chronic diastolic (congestive) heart failure: Secondary | ICD-10-CM | POA: Diagnosis not present

## 2022-06-27 DIAGNOSIS — N3000 Acute cystitis without hematuria: Secondary | ICD-10-CM | POA: Diagnosis not present

## 2022-06-27 DIAGNOSIS — E8809 Other disorders of plasma-protein metabolism, not elsewhere classified: Secondary | ICD-10-CM | POA: Diagnosis not present

## 2022-06-27 DIAGNOSIS — E782 Mixed hyperlipidemia: Secondary | ICD-10-CM | POA: Diagnosis not present

## 2022-06-27 LAB — GLUCOSE, CAPILLARY
Glucose-Capillary: 158 mg/dL — ABNORMAL HIGH (ref 70–99)
Glucose-Capillary: 149 mg/dL — ABNORMAL HIGH (ref 70–99)
Glucose-Capillary: 193 mg/dL — ABNORMAL HIGH (ref 70–99)

## 2022-06-27 LAB — URINE CULTURE: Culture: >=100000 — AB

## 2022-06-27 LAB — BASIC METABOLIC PANEL
Anion gap: 9 (ref 5–15)
BUN: 18 mg/dL (ref 8–23)
CO2: 21 mmol/L — ABNORMAL LOW (ref 22–32)
Calcium: 8.6 mg/dL — ABNORMAL LOW (ref 8.9–10.3)
Chloride: 112 mmol/L — ABNORMAL HIGH (ref 98–111)
Creatinine, Ser: 2.03 mg/dL — ABNORMAL HIGH (ref 0.44–1.00)
GFR, Estimated: 26 mL/min — ABNORMAL LOW (ref >60)
Glucose, Bld: 148 mg/dL — ABNORMAL HIGH (ref 70–99)
Potassium: 3.7 mmol/L (ref 3.5–5.1)
Sodium: 142 mmol/L (ref 135–145)

## 2022-06-27 MED ORDER — CIPROFLOXACIN HCL 250 MG PO TABS: 250.0000 mg | ORAL_TABLET | Freq: Two times a day (BID) | ORAL | Status: DC

## 2022-06-27 MED ORDER — CIPROFLOXACIN HCL 250 MG PO TABS: 250.0000 mg | ORAL_TABLET | Freq: Every day | ORAL | 0 refills | Status: AC

## 2022-06-27 MED ORDER — CIPROFLOXACIN HCL 250 MG PO TABS
250.0000 mg | ORAL_TABLET | Freq: Every day | ORAL | Status: DC
  Administered 2022-06-27: 250 mg via ORAL
  Filled 2022-06-27: qty 1

## 2022-06-27 MED ORDER — CIPROFLOXACIN HCL 250 MG PO TABS: 250.0000 mg | ORAL_TABLET | Freq: Every day | ORAL | Status: DC

## 2022-06-27 NOTE — Plan of Care (Signed)
Problem: Education: Goal: Knowledge of the prescribed therapeutic regimen will improve Outcome: Adequate for Discharge Goal: Understanding of discharge needs will improve Outcome: Adequate for Discharge Goal: Individualized Educational Video(s) Outcome: Adequate for Discharge   Problem: Activity: Goal: Ability to avoid complications of mobility impairment will improve Outcome: Adequate for Discharge Goal: Ability to tolerate increased activity will improve Outcome: Adequate for Discharge   Problem: Clinical Measurements: Goal: Postoperative complications will be avoided or minimized Outcome: Adequate for Discharge   Problem: Pain Management: Goal: Pain level will decrease with appropriate interventions Outcome: Adequate for Discharge   Problem: Skin Integrity: Goal: Will show signs of wound healing Outcome: Adequate for Discharge   Problem: Education: Goal: Knowledge of General Education information will improve Description: Including pain rating scale, medication(s)/side effects and non-pharmacologic comfort measures Outcome: Adequate for Discharge   Problem: Health Behavior/Discharge Planning: Goal: Ability to manage health-related needs will improve Outcome: Adequate for Discharge   Problem: Clinical Measurements: Goal: Ability to maintain clinical measurements within normal limits will improve Outcome: Adequate for Discharge Goal: Will remain free from infection Outcome: Adequate for Discharge Goal: Diagnostic test results will improve Outcome: Adequate for Discharge Goal: Respiratory complications will improve Outcome: Adequate for Discharge Goal: Cardiovascular complication will be avoided Outcome: Adequate for Discharge   Problem: Activity: Goal: Risk for activity intolerance will decrease Outcome: Adequate for Discharge   Problem: Nutrition: Goal: Adequate nutrition will be maintained Outcome: Adequate for Discharge   Problem: Coping: Goal: Level of  anxiety will decrease Outcome: Adequate for Discharge   Problem: Elimination: Goal: Will not experience complications related to bowel motility Outcome: Adequate for Discharge Goal: Will not experience complications related to urinary retention Outcome: Adequate for Discharge   Problem: Pain Managment: Goal: General experience of comfort will improve Outcome: Adequate for Discharge   Problem: Safety: Goal: Ability to remain free from injury will improve Outcome: Adequate for Discharge   Problem: Skin Integrity: Goal: Risk for impaired skin integrity will decrease Outcome: Adequate for Discharge   Problem: Education: Goal: Ability to describe self-care measures that may prevent or decrease complications (Diabetes Survival Skills Education) will improve Outcome: Adequate for Discharge Goal: Individualized Educational Video(s) Outcome: Adequate for Discharge   Problem: Coping: Goal: Ability to adjust to condition or change in health will improve Outcome: Adequate for Discharge   Problem: Fluid Volume: Goal: Ability to maintain a balanced intake and output will improve Outcome: Adequate for Discharge   Problem: Health Behavior/Discharge Planning: Goal: Ability to identify and utilize available resources and services will improve Outcome: Adequate for Discharge Goal: Ability to manage health-related needs will improve Outcome: Adequate for Discharge   Problem: Metabolic: Goal: Ability to maintain appropriate glucose levels will improve Outcome: Adequate for Discharge   Problem: Nutritional: Goal: Maintenance of adequate nutrition will improve Outcome: Adequate for Discharge Goal: Progress toward achieving an optimal weight will improve Outcome: Adequate for Discharge   Problem: Skin Integrity: Goal: Risk for impaired skin integrity will decrease Outcome: Adequate for Discharge   Problem: Tissue Perfusion: Goal: Adequacy of tissue perfusion will improve Outcome:  Adequate for Discharge   Problem: Acute Rehab PT Goals(only PT should resolve) Goal: Patient Will Transfer Sit To/From Stand Outcome: Adequate for Discharge Goal: Pt Will Transfer Bed To Chair/Chair To Bed Outcome: Adequate for Discharge Goal: Pt Will Ambulate Outcome: Adequate for Discharge Goal: Pt/caregiver will Perform Home Exercise Program Outcome: Adequate for Discharge   Problem: Acute Rehab OT Goals (only OT should resolve) Goal: Pt. Will Perform Grooming Outcome: Adequate  for Discharge Goal: Pt. Will Perform Lower Body Bathing Outcome: Adequate for Discharge Goal: Pt. Will Perform Lower Body Dressing Outcome: Adequate for Discharge Goal: Pt. Will Transfer To Toilet Outcome: Adequate for Discharge

## 2022-06-27 NOTE — Assessment & Plan Note (Signed)
Patient with hypotension, hypovolemic, (not septic),  Patient has responded well to antibiotic therapy, she has been afebrile and her wbc is down to 6,0. Her systolic blood pressure has been 130 to 148 mmHg.  Urine culture positive for E coli and Morganella (resistant to ampicillin and 1st generation cephalosporins).   Plan to transition to oral ciprofloxacin renally dose Hold on midodrine and antihypertensive medications  Follow up as outpatient.

## 2022-06-27 NOTE — TOC Transition Note (Signed)
Transition of Care [facility]) - CM/SW Discharge Note   Patient Details  Name: [name] MRN: [id number] Date of Birth: 1951-08-14  Transition of Care [facility]) CM/SW Contact:  Shade Flood, LCSW Phone Number: 06/27/2022, 12:47 PM   Clinical Narrative:     Pt stable for dc today per MD. [facility] will follow pt at home. No other TOC needs for dc.  Final next level of care: Home w Home Health Services Barriers to Discharge: Barriers Resolved   Patient Goals and CMS Choice   CMS Medicare.gov Compare Post Acute Care list provided to:: Patient Choice offered to / list presented to : Patient  Discharge Placement                       Discharge Plan and Services                              Date [place]: 06/27/22   Representative spoke with at [place]: [place] ([place]) Interventions     Readmission Risk Interventions    06/27/2022    8:24 AM  Readmission Risk Prevention Plan  Transportation Screening Complete  Medication Review ([place]) Complete  PCP or Specialist appointment within 3-5 days of discharge Not Complete  HRI or [place] Complete  SW Recovery Care/Counseling Consult Complete  [place] Not Applicable

## 2022-06-27 NOTE — TOC Initial Note (Signed)
Transition of Care [facility]) - Initial/Assessment Note    Patient Details  Name: [name] MRN: [id number] Date of Birth: 20-Mar-1951  Transition of Care [facility]) CM/SW Contact:    Boneta Lucks, RN Phone Number: 06/27/2022, 8:25 AM  Clinical Narrative:   Patient readmitted.  PT recommended HHPT. Patient was at [facility] last week and  set up with [facility] for home PT. Patient wishes to continue. She could not remember to agencies. Added to AVS. MD aware to do resumption orders.          Expected Discharge Plan: [place] Barriers to Discharge: Continued Medical Work up   Patient Goals and CMS Choice        Expected Discharge Plan and Services Expected Discharge Plan: [place]       Living arrangements for the past 2 months: Single Family Home                Prior Living Arrangements/Services Living arrangements for the past 2 months: Single Family Home Lives with:: Spouse Patient language and need for interpreter reviewed:: Yes        Need for Family Participation in Patient Care: Yes (Comment) Care giver support system in place?: Yes (comment)   Criminal Activity/Legal Involvement Pertinent to Current Situation/Hospitalization: No - Comment as needed  Activities of Daily Living Home Assistive Devices/Equipment: Cane (specify quad or straight), Walker (specify type) (straight) ADL Screening (condition at time of admission) Patient's cognitive ability adequate to safely complete daily activities?: Yes Is the patient deaf or have difficulty hearing?: No Does the patient have difficulty seeing, even when wearing glasses/contacts?: No Does the patient have difficulty concentrating, remembering, or making decisions?: No Patient able to express need for assistance with ADLs?: Yes Does the patient have difficulty dressing or bathing?: No Independently performs ADLs?: Yes (appropriate for developmental age) Does the patient have  difficulty walking or climbing stairs?: Yes Weakness of Legs: Left Weakness of Arms/Hands: None  Permission Sought/Granted     Emotional Assessment   Attitude/Demeanor/Rapport: Engaged Affect (typically observed): Accepting Orientation: : Oriented to Self, Oriented to Place, Oriented to  Time, Oriented to Situation Alcohol / Substance Use: Not Applicable Psych Involvement: No (comment)  Admission diagnosis:  Hypotension [I95.9] Hypotension, unspecified hypotension type [I95.9] Acute kidney injury superimposed on chronic kidney disease ([place]) [N17.9, N18.9] Patient Active Problem List   Diagnosis Date Noted   Hypotension 06/25/2022   Dehydration 06/25/2022   Transaminitis 06/25/2022   Hypoalbuminemia due to protein-calorie malnutrition ([place]) 06/25/2022   Type 2 diabetes mellitus ([place]) 06/25/2022   Status post left hip replacement 06/20/2022   Unilateral primary osteoarthritis, left hip 06/18/2022   Status post total replacement of left hip 06/18/2022   Visual hallucination 02/25/2022   Delirium 59/93/5701   Metabolic acidosis 77/93/9030   Hypophosphatemia 02/22/2022   Hypomagnesemia 02/22/2022   Acute blood loss anemia superimposed on anemia of chronic disease 02/20/2022   Acute toxic and metabolic encephalopathy 06/06/3006   Thrombocytopenia ([place]) 02/20/2022   Hypokalemia 02/20/2022   Rhabdomyolysis 02/20/2022   Lactic acidosis 02/20/2022   UTI (urinary tract infection) 02/20/2022   Clostridioides difficile infection 02/18/2022   Facial trauma due to fall at home 02/17/2022   Hypotension 02/17/2022   Acute kidney injury superimposed on chronic kidney disease ([place]) 02/17/2022   Hypovolemic shock ([place])    Cardiomyopathy ([place]) 11/14/2020   Diabetic nephropathy ([place]) 11/14/2020   Impaired mobility and activities of daily living 06/10/2020   Alcohol  withdrawal syndrome ([place]) 06/09/2020   Facet arthropathy, lumbar 06/09/2020   Sepsis ([place]) 06/09/2020   Sinus tachycardia by  electrocardiogram 06/09/2020   Spondylolisthesis of lumbosacral region 06/09/2020   Altered mental status 03/12/2020   Stage 3b chronic kidney disease ([place]) 10/24/2019   Urine leukocytes 09/24/2019   Other microscopic hematuria 09/24/2019   Postmenopause 02/08/2018   Screening for colorectal cancer 02/08/2018   Well woman exam with routine gynecological exam 02/08/2018   Anemia in chronic kidney disease 12/15/2016   Uncontrolled hypertension 09/12/2015   Cervical high risk HPV (human papillomavirus) test positive 01/30/2015   Biventricular ICD (implantable cardioverter-defibrillator) in place 57/84/6962   Chronic systolic heart failure ([place]) 12/18/2013   Preop cardiovascular exam 11/17/2011   Colon cancer screening 02/04/2011   Uncontrolled IDDM-2 with hyperglycemia and CKD-3B 12/31/2009   Mixed hyperlipidemia 12/31/2009   ANXIETY DEPRESSION 12/31/2009   GASTROESOPHAGEAL REFLUX DISEASE, CHRONIC 12/31/2009   CONSTIPATION, CHRONIC 12/31/2009   Chronic lower back pain 12/31/2009   Cerebral vascular accident ([place]) 05/22/2009   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   HYPERTENSION, HEART CONTROLLED W/O ASSOC CHF 11/27/2008   Chronic diastolic CHF (congestive heart failure) ([place]) 11/27/2008   PACEMAKER, PERMANENT 11/27/2008   PCP:  Celene Squibb, MD Pharmacy:   [facility] [place] [address] Phone: [phone] Fax: [phone]    Readmission Risk Interventions    06/27/2022    8:24 AM  Readmission Risk Prevention Plan  Transportation Screening Complete  Medication Review (RN Care Manager) Complete  PCP or Specialist appointment within 3-5 days of discharge Not Complete  HRI or [place] Complete  SW Recovery Care/Counseling Consult Complete  Palliative Care Screening Not [place] Not Applicable

## 2022-06-27 NOTE — Assessment & Plan Note (Addendum)
Patient as placed on insulin sliding scale for glucose cover and monitoring Her glucose remained stable with fasting glucose at the time of her discharge 148 mg/dl.  At home patient will continue her insulin regimen with basal and sliding scale.

## 2022-06-27 NOTE — Assessment & Plan Note (Addendum)
CKD stage IV.   Renal function has improved with supportive medical therapy, likely hypovolemic renal failure.  At the time of her discharge her serum cr is 2,0 with K at 3,7 and serum bicarbonate at 21.  Plan to continue to hold on diuretic therapy and antihypertensive medications.  Follow up renal function and electrolytes as outpatient.   Anemia of chronic renal disease. Her discharge hgb is 7,8, Follow up hgb as outpatient, patient may need EPO.

## 2022-06-27 NOTE — Assessment & Plan Note (Signed)
No clinical signs of exacerbation.   Worsening renal function and elevated liver enzymes due to hypoperfusion. Hemodynamics have improved, renal function is improving and liver enzymes are trending down. Plan to continue to hold on carvedilol, losartan and furosemide.  No SGLT 2 inh due to frequent urine infections.

## 2022-06-27 NOTE — Progress Notes (Signed)
Pt. Stated that she is ready to leave and she wants nurse to take out IV. Pt. Has husband at home with one leg, and she is paying someone to take care of him. MD notified and stated that he would be down to talk to her in 10 minutes. Pt. Looked at the clock and said, "It's 1130." Charge nurse notified.

## 2022-06-27 NOTE — Progress Notes (Signed)
Discharge packet reviewed with pt. Pt. Verbalized understanding. PIV removed. Pt. escorted to private vehicle with personal belongings.

## 2022-06-27 NOTE — Hospital Course (Addendum)
[name] was admitted to the hospital with the working diagnosis of hypotension.   71 yo female with the past medical history of hypertension, dyslipidemia, T2DM, and neurogenic bladder who presented with hypotension. She reported one week of low blood pressure and fatigue. Recent hospitalization 06/2022 for left total hip arthroplasty, she was discharged home with no antihypertensive medications due to hypotension. At home her blood pressure continue to be lowe with systolic in the 01'I and 10'V. On her initial physical examination her blood pressure was 81/53, 123/69, HR 83, RR 17 and 02 saturation 100%, lungs with no wheezing or rales, heart with S1 and S2 present and rhythmic, abdomen with no distention, no lower extremity edema.   Na 139, K 4,5 CL 107 bicarbonate 23 glucose 106 bun 23 cr 2,68  AST 135 ALT 107  Wbc 5,4 hgb 8,0 plt 290  Sars covid 19 negative   Urine analysis with SG 1,008, 11-20 wbc , 100 protein.  Urine culture with >100,000 CFU E coli and morganella   Chest radiograph with no cardiomegaly, lungs with no infiltrates. 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 One atrial lead and two ventricular leads.   CT head with no acute changes.   EKG 81 bpm, normal axis, normal intervals, atrial sensed and ventricular paced, with no significant ST segment or T wave changes.   Patient was placed on IV fluids and started on midodrine for blood pressure support.  Antibiotic therapy with ciprofloxacin.   Her hemodynamics have improved.  Urine culture positive for E coli and Morganella (resistant to 1st generation cephalosporins and ampicillin).  Midodrine will be held, patient will follow up as outpatient. Continue antibiotic therapy for 5 more days.

## 2022-06-27 NOTE — Assessment & Plan Note (Signed)
Continue with nutrition supplements. °

## 2022-06-27 NOTE — Discharge Summary (Signed)
Physician Discharge Summary   Patient: [name] MRN: [id number] DOB: [birth year]  Admit date:     06/25/2022  Discharge date: 06/27/22  Discharge Physician: [name]   PCP: Melanie Squibb, MD   Recommendations at discharge:    Continue antibiotic therapy with ciprofloxacin 250 mg daily for 5 more days Continue close blood pressure monitoring as outpatient, continue to hold on carvedilol, furosemide and losartan. Follow up renal function as outpatient in 7 days Follow up with [name] in 7 days.   Discharge Diagnoses: Active Problems:   UTI (urinary tract infection)   Acute kidney injury superimposed on chronic kidney disease (HCC)   Chronic diastolic CHF (congestive heart failure) (HCC)   Hypoalbuminemia due to protein-calorie malnutrition (HCC)   Mixed hyperlipidemia   Type 2 diabetes mellitus ([place])  Resolved Problems:   * No resolved hospital problems. [facility] Course: [name] was admitted to the hospital with the working diagnosis of hypotension.   71 yo female with the past medical history of hypertension, dyslipidemia, T2DM, and neurogenic bladder who presented with hypotension. She reported one week of low blood pressure and fatigue. Recent hospitalization 06/2022 for left total hip arthroplasty, she was discharged home with no antihypertensive medications due to hypotension. At home her blood pressure continue to be lowe with systolic in the 93'X and 90'W. On her initial physical examination her blood pressure was 81/53, 123/69, HR 83, RR 17 and 02 saturation 100%, lungs with no wheezing or rales, heart with S1 and S2 present and rhythmic, abdomen with no distention, no lower extremity edema.   Na 139, K 4,5 CL 107 bicarbonate 23 glucose 106 bun 23 cr 2,68  AST 135 ALT 107  Wbc 5,4 hgb 8,0 plt 290  Sars covid 19 negative   Urine analysis with SG 1,008, 11-20 wbc , 100 protein.  Urine culture with >100,000 CFU E coli and morganella   Chest  radiograph with no cardiomegaly, lungs with no infiltrates. 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 One atrial lead and two ventricular leads.   CT head with no acute changes.   EKG 81 bpm, normal axis, normal intervals, atrial sensed and ventricular paced, with no significant ST segment or T wave changes.   Patient was placed on IV fluids and started on midodrine for blood pressure support.  Antibiotic therapy with ciprofloxacin.   Her hemodynamics have improved.  Urine culture positive for E coli and Morganella (resistant to 1st generation cephalosporins and ampicillin).  Midodrine will be held, patient will follow up as outpatient. Continue antibiotic therapy for 5 more days.      Assessment and Plan: UTI (urinary tract infection) Patient with hypotension, hypovolemic, (not septic),  Patient has responded well to antibiotic therapy, she has been afebrile and her wbc is down to 6,0. Her systolic blood pressure has been 130 to 148 mmHg.  Urine culture positive for E coli and Morganella (resistant to ampicillin and 1st generation cephalosporins).   Plan to transition to oral ciprofloxacin renally dose Hold on midodrine and antihypertensive medications  Follow up as outpatient.   Acute kidney injury superimposed on chronic kidney disease ([place]) CKD stage IV.   Renal function has improved with supportive medical therapy, likely hypovolemic renal failure.  At the time of her discharge her serum cr is 2,0 with K at 3,7 and serum bicarbonate at 21.  Plan to continue to hold on diuretic therapy and antihypertensive medications.  Follow up renal function and electrolytes as outpatient.  Anemia of chronic renal disease. Her discharge hgb is 7,8, Follow up hgb as outpatient, patient may need EPO.   Chronic diastolic CHF (congestive heart failure) (HCC) No clinical signs of exacerbation.   Worsening renal function and elevated liver enzymes due to hypoperfusion. Hemodynamics have  improved, renal function is improving and liver enzymes are trending down. Plan to continue to hold on carvedilol, losartan and furosemide.  No SGLT 2 inh due to frequent urine infections.   Hypoalbuminemia due to protein-calorie malnutrition ([place]) Continue with nutrition supplements.   Mixed hyperlipidemia Continue with statin   Type 2 diabetes mellitus ([place]) Patient as placed on insulin sliding scale for glucose cover and monitoring Her glucose remained stable with fasting glucose at the time of her discharge 148 mg/dl.          Consultants: none Procedures performed: none  Disposition: Home Diet recommendation:  Cardiac and Carb modified diet DISCHARGE MEDICATION: Allergies as of 06/27/2022   No Known Allergies      Medication List     STOP taking these medications    carvedilol 12.5 MG tablet Commonly known as: COREG   furosemide 40 MG tablet Commonly known as: LASIX   HYDROcodone-acetaminophen 10-325 MG tablet Commonly known as: NORCO   losartan 25 MG tablet Commonly known as: COZAAR   meloxicam 7.5 MG tablet Commonly known as: Mobic   potassium chloride SA 20 MEQ tablet Commonly known as: KLOR-CON M       TAKE these medications    aspirin 81 MG chewable tablet Chew 1 tablet (81 mg total) by mouth 2 (two) times daily.   buPROPion 300 MG 24 hr tablet Commonly known as: WELLBUTRIN XL Take 300 mg by mouth daily.   ciprofloxacin 250 MG tablet Commonly known as: CIPRO Take 1 tablet (250 mg total) by mouth daily with breakfast for 5 days. Start taking on: June 28, 2022   clonazePAM 1 MG tablet Commonly known as: KLONOPIN Take 1 mg by mouth daily as needed for anxiety.   gabapentin 600 MG tablet Commonly known as: NEURONTIN Take 600 mg by mouth at bedtime.   HumaLOG KwikPen 100 UNIT/ML KwikPen Generic drug: insulin lispro Inject 3 Units into the skin 3 (three) times daily. Per sliding scale   insulin glargine 100 UNIT/ML  injection Commonly known as: LANTUS Inject 6 Units into the skin every morning.   omeprazole 20 MG capsule Commonly known as: PRILOSEC Take 20 mg by mouth daily.   oxyCODONE 5 MG immediate release tablet Commonly known as: Oxy IR/ROXICODONE Take 1-2 tablets (5-10 mg total) by mouth every 4 (four) hours as needed for moderate pain (pain score 4-6).   rosuvastatin 40 MG tablet Commonly known as: CRESTOR TAKE ONE (1) TABLET BY MOUTH EVERY DAY (STOP ATORVASTATIN) What changed: See the new instructions.   sertraline 100 MG tablet Commonly known as: ZOLOFT Take 200 mg by mouth daily.   Systane Balance 0.6 % Soln Generic drug: Propylene Glycol Place 1 drop into both eyes in the morning and at bedtime.   tiZANidine 4 MG tablet Commonly known as: ZANAFLEX Take 1 tablet (4 mg total) by mouth every 6 (six) hours as needed for muscle spasms. Take 1 tablet 3 times a day by oral route as needed. Do not drink alcohol or drive while taking this medication. May cause drowsiness What changed:  when to take this additional instructions   zolpidem 10 MG tablet Commonly known as: AMBIEN Take 10 mg by mouth at bedtime.  Follow-up Information     [facility].   Specialty: Home Health Services Why: PT will call to schedule your next home visit. Contact information: [address] [place] [address] [phone]                Discharge Exam: Danley Danker Weights   06/25/22 2331 06/26/22 0500 06/27/22 0621  Weight: 61.9 kg 62.1 kg 61.9 kg   BP (!) 130/58 (BP Location: Right Arm)   Pulse 94   Temp 98.4 F (36.9 C) (Oral)   Resp 19   Ht '5\' 1"'$  (1.549 m)   Wt 61.9 kg   SpO2 98%   BMI 25.78 kg/m   Patient has been ambulating, no chest pain or dyspnea, no dizziness or lightheadedness   Neurology awake and alert ENT with mild pallor Cardiovascular with S1 and S2 present and rhythmic with no gallops, rubs or murmurs Respiratory with no rales  or wheezing Abdomen with no distention  No lower extremity edema   Condition at discharge: stable  The results of significant diagnostics from this hospitalization (including imaging, microbiology, ancillary and laboratory) are listed below for reference.   Imaging Studies: NM Pulmonary Perfusion  Result Date: 06/26/2022 CLINICAL DATA:  High clinical suspicion of pulmonary embolism, hypotension, fatigue, weakness, headache postop from hip replacement on 06/18/2022, history hypertension, CHF EXAM: NUCLEAR MEDICINE PERFUSION LUNG SCAN TECHNIQUE: Perfusion images were obtained in multiple projections after intravenous injection of radiopharmaceutical. Ventilation scans intentionally deferred if perfusion scan and chest x-ray adequate for interpretation during COVID 19 epidemic. RADIOPHARMACEUTICALS:  4.2 mCi Tc-28mMAA IV COMPARISON:  Chest radiograph 06/25/2022 FINDINGS: Normal perfusion lung scan. No perfusion defects. IMPRESSION: Normal perfusion lung scan. Electronically Signed   By: MLavonia DanaM.D.   On: 06/26/2022 12:50   CT Head Wo Contrast  Result Date: 06/25/2022 CLINICAL DATA:  Mental status change. EXAM: CT HEAD WITHOUT CONTRAST TECHNIQUE: Contiguous axial images were obtained from the base of the skull through the vertex without intravenous contrast. RADIATION DOSE REDUCTION: This exam was performed according to the departmental dose-optimization program which includes automated exposure control, adjustment of the mA and/or kV according to patient size and/or use of iterative reconstruction technique. COMPARISON:  02/17/22 CT Brain FINDINGS: Brain: No evidence of acute infarction, hemorrhage, hydrocephalus, extra-axial collection or mass lesion/mass effect. Vascular: No hyperdense vessel or unexpected calcification. Skull: Normal. Negative for fracture or focal lesion. Sinuses/Orbits: Bilateral lens replacement. Minimal mucosal thickening bilateral ethmoid sinuses. Other: None. IMPRESSION:  No acute intracranial abnormality. Electronically Signed   By: HMarin RobertsM.D.   On: 06/25/2022 14:48   DG Chest Portable 1 View  Result Date: 06/25/2022 CLINICAL DATA:  Weakness EXAM: PORTABLE CHEST 1 VIEW COMPARISON:  02/17/2022 FINDINGS: The heart size and mediastinal contours are within normal limits. Left chest multi lead pacer defibrillator. Both lungs are clear. The visualized skeletal structures are unremarkable. IMPRESSION: No acute abnormality of the lungs in AP portable projection. Electronically Signed   By: ADelanna AhmadiM.D.   On: 06/25/2022 13:27   DG Pelvis Portable  Result Date: 06/18/2022 CLINICAL DATA:  Status post total replacement of left hip EXAM: PORTABLE PELVIS 1 VIEWS COMPARISON:  Pelvic radiograph dated February 17, 2022 FINDINGS: Interval postsurgical changes from left total hip arthroplasty. Arthroplasty components appear in their expected alignment. No periprosthetic fracture is identified. Expected postoperative changes within the overlying soft tissues. IMPRESSION: Postsurgical changes from left total hip arthroplasty Electronically Signed   By: LDenny Peon  Strickland M.D.   On: 06/18/2022 10:16   DG HIP UNILAT WITH PELVIS 1V LEFT  Result Date: 06/18/2022 CLINICAL DATA:  Intraoperative fluoroscopy for total left hip arthroplasty. EXAM: DG HIP (WITH OR WITHOUT PELVIS) 1V*L* COMPARISON:  Pelvis and left hip radiographs 03/13/2020, CT left hip 05/12/2022 FINDINGS: Images were performed intraoperatively without the presence of a radiologist. New total left hip arthroplasty. No evidence of hardware complication. Total fluoroscopy images: 3 Total fluoroscopy time: 18 seconds Total dose: Radiation Exposure Index (as provided by the fluoroscopic device): 2.295 mGy air Kerma Please see intraoperative findings for further detail. IMPRESSION: Intraoperative fluoroscopy for total left hip arthroplasty. Electronically Signed   By: Yvonne Kendall M.D.   On: 06/18/2022 08:58   DG C-Arm 1-60  Min-No Report  Result Date: 06/18/2022 Fluoroscopy was utilized by the requesting physician.  No radiographic interpretation.    Microbiology: Results for orders placed or performed during the hospital encounter of 06/25/22  Resp Panel by RT-PCR (Flu A&B, Covid) Anterior Nasal Swab     Status: None   Collection Time: 06/25/22 12:59 PM   Specimen: Anterior Nasal Swab  Result Value Ref Range Status   SARS Coronavirus 2 by RT PCR NEGATIVE NEGATIVE Final    Comment: (NOTE) SARS-CoV-2 target nucleic acids are NOT DETECTED.  The SARS-CoV-2 RNA is generally detectable in upper respiratory specimens during the acute phase of infection. The lowest concentration of SARS-CoV-2 viral copies this assay can detect is 138 copies/mL. A negative result does not preclude SARS-Cov-2 infection and should not be used as the sole basis for treatment or other patient management decisions. A negative result may occur with  improper specimen collection/handling, submission of specimen other than nasopharyngeal swab, presence of viral mutation(s) within the areas targeted by this assay, and inadequate number of viral copies(<138 copies/mL). A negative result must be combined with clinical observations, patient history, and epidemiological information. The expected result is Negative.  Fact Sheet for Patients:  EntrepreneurPulse.com.au  Fact Sheet for Healthcare Providers:  IncredibleEmployment.be  This test is no t yet approved or cleared by the [place] FDA and  has been authorized for detection and/or diagnosis of SARS-CoV-2 by FDA under an Emergency Use Authorization (EUA). This EUA will remain  in effect (meaning this test can be used) for the duration of the COVID-19 declaration under Section 564(b)(1) of the Act, 21 U.S.C.section 360bbb-3(b)(1), unless the authorization is terminated  or revoked sooner.       Influenza A by PCR NEGATIVE NEGATIVE Final    Influenza B by PCR NEGATIVE NEGATIVE Final    Comment: (NOTE) The Xpert Xpress SARS-CoV-2/FLU/RSV plus assay is intended as an aid in the diagnosis of influenza from Nasopharyngeal swab specimens and should not be used as a sole basis for treatment. Nasal washings and aspirates are unacceptable for Xpert Xpress SARS-CoV-2/FLU/RSV testing.  Fact Sheet for Patients: EntrepreneurPulse.com.au  Fact Sheet for Healthcare Providers: IncredibleEmployment.be  This test is not yet approved or cleared by the [place] FDA and has been authorized for detection and/or diagnosis of SARS-CoV-2 by FDA under an Emergency Use Authorization (EUA). This EUA will remain in effect (meaning this test can be used) for the duration of the COVID-19 declaration under Section 564(b)(1) of the Act, 21 U.S.C. section 360bbb-3(b)(1), unless the authorization is terminated or revoked.  Performed at [facility], [address] [place], [place] [address]   Urine Culture     Status: Abnormal   Collection Time: 06/25/22 12:59 PM  Specimen: In/Out Cath Urine  Result Value Ref Range Status   Specimen Description   Final    IN/OUT CATH URINE Performed at [facility], [address], [place], [place] [address]    Special Requests   Final    NONE Performed at [facility], [address], [place], [place] [address]    Culture (A)  Final    >=100,000 COLONIES/mL ESCHERICHIA COLI >=100,000 COLONIES/mL MORGANELLA MORGANII    Report Status 06/27/2022 FINAL  Final   Organism ID, Bacteria ESCHERICHIA COLI (A)  Final   Organism ID, Bacteria MORGANELLA MORGANII (A)  Final      Susceptibility   Escherichia coli - MIC*    AMPICILLIN <=2 SENSITIVE Sensitive     CEFAZOLIN <=4 SENSITIVE Sensitive     CEFEPIME <=0.12 SENSITIVE Sensitive     CEFTRIAXONE <=0.25 SENSITIVE Sensitive     CIPROFLOXACIN <=0.25 SENSITIVE Sensitive     GENTAMICIN <=1 SENSITIVE Sensitive     IMIPENEM <=0.25  SENSITIVE Sensitive     NITROFURANTOIN <=16 SENSITIVE Sensitive     TRIMETH/SULFA <=20 SENSITIVE Sensitive     AMPICILLIN/SULBACTAM <=2 SENSITIVE Sensitive     PIP/TAZO <=4 SENSITIVE Sensitive     * >=100,000 COLONIES/mL ESCHERICHIA COLI   Morganella morganii - MIC*    AMPICILLIN >=32 RESISTANT Resistant     CEFAZOLIN >=64 RESISTANT Resistant     CIPROFLOXACIN <=0.25 SENSITIVE Sensitive     GENTAMICIN <=1 SENSITIVE Sensitive     IMIPENEM 2 SENSITIVE Sensitive     NITROFURANTOIN RESISTANT Resistant     TRIMETH/SULFA <=20 SENSITIVE Sensitive     AMPICILLIN/SULBACTAM >=32 RESISTANT Resistant     PIP/TAZO <=4 SENSITIVE Sensitive     * >=100,000 COLONIES/mL MORGANELLA MORGANII    Labs: CBC: Recent Labs  Lab 06/25/22 1259 06/25/22 1306 06/26/22 0330  WBC 5.4  --  6.0  NEUTROABS 3.6  --   --   HGB 8.0* 8.2* 7.8*  HCT 25.8* 24.0* 26.0*  MCV 95.9  --  98.1  PLT 290  --  009   Basic Metabolic Panel: Recent Labs  Lab 06/25/22 1259 06/25/22 1306 06/26/22 0330 06/27/22 0446  NA 139 137 141 142  K 4.5 6.7* 3.6 3.7  CL 107 106 111 112*  CO2 23  --  22 21*  GLUCOSE 106* 100* 192* 148*  BUN 23 33* 23 18  CREATININE 2.68* 2.80* 2.43* 2.03*  CALCIUM 9.0  --  8.6* 8.6*  MG  --   --  2.4  --   PHOS  --   --  3.7  --    Liver Function Tests: Recent Labs  Lab 06/25/22 1259 06/26/22 0330  AST 135* 98*  ALT 107* 92*  ALKPHOS 115 101  BILITOT 0.8 0.3  PROT 6.9 6.0*  ALBUMIN 2.8* 2.5*   CBG: Recent Labs  Lab 06/26/22 1100 06/26/22 1608 06/26/22 2126 06/27/22 0750 06/27/22 1126  GLUCAP 180* 158* 158* 149* 193*    Discharge time spent: greater than 30 minutes.  Signed: Tawni Millers, MD [facility] 06/27/2022

## 2022-06-27 NOTE — Assessment & Plan Note (Signed)
Continue with statin. 

## 2022-06-29 ENCOUNTER — Telehealth: Payer: Self-pay | Admitting: *Deleted

## 2022-06-29 DIAGNOSIS — Z471 Aftercare following joint replacement surgery: Secondary | ICD-10-CM | POA: Diagnosis not present

## 2022-06-29 NOTE — Telephone Encounter (Signed)
Call to patient today after discharge from [facility] over the weekend. She states she has stopped all BP meds as well as had a UTI, which they have prescribed Cipro for. [facility] resumed care today for PT. PT discussed medications with patient and she states she is taking Hydrocodone, which she has been taking "For years", but this isn't on her MAR anywhere. Also multiple sedative medications. I explained to her that she needs to only take what is prescribed. PT also noted several empty bottles of Vodka beside the bed and on the night stand.

## 2022-07-01 DIAGNOSIS — Z471 Aftercare following joint replacement surgery: Secondary | ICD-10-CM | POA: Diagnosis not present

## 2022-07-02 ENCOUNTER — Ambulatory Visit (INDEPENDENT_AMBULATORY_CARE_PROVIDER_SITE_OTHER): Payer: Medicare Other | Admitting: Orthopaedic Surgery

## 2022-07-02 ENCOUNTER — Encounter: Payer: Self-pay | Admitting: Orthopaedic Surgery

## 2022-07-02 DIAGNOSIS — Z96642 Presence of left artificial hip joint: Secondary | ICD-10-CM

## 2022-07-02 NOTE — Progress Notes (Signed)
The patient is here today for first postoperative visit status post a left total hip arthroplasty.  She seems to be doing well overall.  She actually came in today without any assistive device to help her walk.  She has been compliant with a baby aspirin twice a day.  She is on hydrocodone as well as Zanaflex.  On exam her left hip incision looks good.  The staples been removed and Steri-Strips applied.  There is some swelling to be expected.  Her calf is soft and there is really no foot and ankle swelling today.  She will continue to increase her activities as comfort allows.  She can go back to her once a day baby aspirin that she was on prior to surgery.  If she runs low medications for pain she will let us know.  All questions and concerns were answered and addressed.  I will see her back in 4 weeks but no x-rays are needed.

## 2022-07-03 ENCOUNTER — Telehealth: Payer: Self-pay | Admitting: *Deleted

## 2022-07-03 DIAGNOSIS — Z471 Aftercare following joint replacement surgery: Secondary | ICD-10-CM | POA: Diagnosis not present

## 2022-07-03 NOTE — Telephone Encounter (Signed)
Ortho bundle 14 day call completed. 

## 2022-07-06 DIAGNOSIS — N390 Urinary tract infection, site not specified: Secondary | ICD-10-CM | POA: Diagnosis not present

## 2022-07-06 DIAGNOSIS — Z96642 Presence of left artificial hip joint: Secondary | ICD-10-CM | POA: Diagnosis not present

## 2022-07-06 DIAGNOSIS — I5032 Chronic diastolic (congestive) heart failure: Secondary | ICD-10-CM | POA: Diagnosis not present

## 2022-07-06 DIAGNOSIS — N179 Acute kidney failure, unspecified: Secondary | ICD-10-CM | POA: Diagnosis not present

## 2022-07-06 DIAGNOSIS — I959 Hypotension, unspecified: Secondary | ICD-10-CM | POA: Diagnosis not present

## 2022-07-20 DIAGNOSIS — H26493 Other secondary cataract, bilateral: Secondary | ICD-10-CM | POA: Diagnosis not present

## 2022-07-20 DIAGNOSIS — E119 Type 2 diabetes mellitus without complications: Secondary | ICD-10-CM | POA: Diagnosis not present

## 2022-07-20 DIAGNOSIS — H353132 Nonexudative age-related macular degeneration, bilateral, intermediate dry stage: Secondary | ICD-10-CM | POA: Diagnosis not present

## 2022-07-20 DIAGNOSIS — H16212 Exposure keratoconjunctivitis, left eye: Secondary | ICD-10-CM | POA: Diagnosis not present

## 2022-07-23 DIAGNOSIS — H04202 Unspecified epiphora, left lacrimal gland: Secondary | ICD-10-CM | POA: Diagnosis not present

## 2022-07-29 ENCOUNTER — Ambulatory Visit (INDEPENDENT_AMBULATORY_CARE_PROVIDER_SITE_OTHER): Payer: Medicare Other | Admitting: Orthopaedic Surgery

## 2022-07-29 ENCOUNTER — Encounter: Payer: Self-pay | Admitting: Orthopaedic Surgery

## 2022-07-29 DIAGNOSIS — Z96642 Presence of left artificial hip joint: Secondary | ICD-10-CM

## 2022-07-29 NOTE — Progress Notes (Signed)
The patient comes in today at 6 weeks status post a left total hip arthroplasty.  She is a very active 71 year old female and is doing great overall.  She walks with a normal gait.  Both hips move smoothly and fluidly.  Her left operative hip is just only slightly stiff.  She is doing so well we do not need to see her back for 6 months unless she has any issues.  All questions and concerns were answered and addressed.  At her next visit we will have a standing low AP pelvis and lateral of her left operative hip.

## 2022-07-30 ENCOUNTER — Encounter: Payer: Self-pay | Admitting: Plastic Surgery

## 2022-07-30 ENCOUNTER — Ambulatory Visit (INDEPENDENT_AMBULATORY_CARE_PROVIDER_SITE_OTHER): Payer: Medicare Other | Admitting: Plastic Surgery

## 2022-07-30 ENCOUNTER — Ambulatory Visit: Payer: Medicare Other | Admitting: Orthopaedic Surgery

## 2022-07-30 VITALS — BP 132/60 | HR 89

## 2022-07-30 DIAGNOSIS — S0993XD Unspecified injury of face, subsequent encounter: Secondary | ICD-10-CM

## 2022-07-30 DIAGNOSIS — S0993XA Unspecified injury of face, initial encounter: Secondary | ICD-10-CM

## 2022-07-30 DIAGNOSIS — W19XXXD Unspecified fall, subsequent encounter: Secondary | ICD-10-CM | POA: Diagnosis not present

## 2022-07-30 NOTE — Progress Notes (Signed)
   Subjective:    Patient ID: VERTIS BAUDER, female    DOB: 1951/08/18, 71 y.o.   MRN: [id number]  The patient is a 71 year old female here for evaluation of her face.  In July the patient had an accident and fell.  She had missing skin and soft tissue of her left cheek and presented to the emergency room.  Repair was done in the operating room.  She then had a skin graft for portion that I could not get closed primarily.  She is doing really well and has improved greatly over the past couple of months.  She is now noticing a little bit of trouble with completely closing her left eye.  She is got some tightness on the lower lid and likely needs graft.  She also has some webbing on the medial aspect and may need a Z-plasty or jumping man tissue rearrangement.      Review of Systems  Constitutional: Negative.   HENT: Negative.    Eyes: Negative.   Respiratory: Negative.  Negative for chest tightness and shortness of breath.   Cardiovascular: Negative.   Gastrointestinal: Negative.   Endocrine: Negative.   Genitourinary: Negative.   Musculoskeletal: Negative.        Objective:   Physical Exam Vitals reviewed.  Constitutional:      Appearance: Normal appearance.  Cardiovascular:     Rate and Rhythm: Normal rate.     Pulses: Normal pulses.  Pulmonary:     Effort: Pulmonary effort is normal.  Musculoskeletal:        General: Tenderness present. No swelling or deformity.  Skin:    General: Skin is warm.     Capillary Refill: Capillary refill takes less than 2 seconds.     Coloration: Skin is not jaundiced.     Findings: No bruising or lesion.  Neurological:     Mental Status: She is alert and oriented to person, place, and time.  Psychiatric:        Mood and Affect: Mood normal.        Behavior: Behavior normal.        Thought Content: Thought content normal.        Judgment: Judgment normal.        Assessment & Plan:     ICD-10-CM   1. Facial trauma due to fall at  home  S09.93XA       I am going to have Dr. Isidoro Donning weigh in on this.  The patient agrees with this plan.  I would like her to see me after for further discussion.  Pictures were obtained of the patient and placed in the chart with the patient's or guardian's permission.

## 2022-07-31 ENCOUNTER — Ambulatory Visit: Payer: Medicare Other | Admitting: Plastic Surgery

## 2022-08-03 DIAGNOSIS — N390 Urinary tract infection, site not specified: Secondary | ICD-10-CM | POA: Diagnosis not present

## 2022-08-04 ENCOUNTER — Telehealth: Payer: Self-pay

## 2022-08-04 NOTE — Telephone Encounter (Signed)
Faxed external referral, demographics, insurance card and ov note to Dr. Mel Almond at [facility] per Dr. Delena Serve request. Received success confirmation.

## 2022-08-05 ENCOUNTER — Telehealth: Payer: Self-pay | Admitting: *Deleted

## 2022-08-05 DIAGNOSIS — E109 Type 1 diabetes mellitus without complications: Secondary | ICD-10-CM | POA: Diagnosis not present

## 2022-08-05 DIAGNOSIS — E139 Other specified diabetes mellitus without complications: Secondary | ICD-10-CM | POA: Diagnosis not present

## 2022-08-05 DIAGNOSIS — E785 Hyperlipidemia, unspecified: Secondary | ICD-10-CM | POA: Diagnosis not present

## 2022-08-05 DIAGNOSIS — I10 Essential (primary) hypertension: Secondary | ICD-10-CM | POA: Diagnosis not present

## 2022-08-05 DIAGNOSIS — N1831 Chronic kidney disease, stage 3a: Secondary | ICD-10-CM | POA: Diagnosis not present

## 2022-08-05 NOTE — Telephone Encounter (Signed)
Ortho bundle call completed. 

## 2022-08-10 DIAGNOSIS — R809 Proteinuria, unspecified: Secondary | ICD-10-CM | POA: Diagnosis not present

## 2022-08-10 DIAGNOSIS — E1129 Type 2 diabetes mellitus with other diabetic kidney complication: Secondary | ICD-10-CM | POA: Diagnosis not present

## 2022-08-10 DIAGNOSIS — N189 Chronic kidney disease, unspecified: Secondary | ICD-10-CM | POA: Diagnosis not present

## 2022-08-10 DIAGNOSIS — I129 Hypertensive chronic kidney disease with stage 1 through stage 4 chronic kidney disease, or unspecified chronic kidney disease: Secondary | ICD-10-CM | POA: Diagnosis not present

## 2022-08-10 DIAGNOSIS — N19 Unspecified kidney failure: Secondary | ICD-10-CM | POA: Diagnosis not present

## 2022-08-10 DIAGNOSIS — E1122 Type 2 diabetes mellitus with diabetic chronic kidney disease: Secondary | ICD-10-CM | POA: Diagnosis not present

## 2022-08-10 DIAGNOSIS — I5042 Chronic combined systolic (congestive) and diastolic (congestive) heart failure: Secondary | ICD-10-CM | POA: Diagnosis not present

## 2022-08-19 DIAGNOSIS — H16212 Exposure keratoconjunctivitis, left eye: Secondary | ICD-10-CM | POA: Diagnosis not present

## 2022-08-21 ENCOUNTER — Encounter (HOSPITAL_COMMUNITY): Payer: Medicare Other

## 2022-08-21 DIAGNOSIS — I255 Ischemic cardiomyopathy: Secondary | ICD-10-CM | POA: Diagnosis not present

## 2022-08-21 LAB — CUP PACEART REMOTE DEVICE CHECK
Battery Remaining Longevity: 77 mo
Battery Voltage: 2.98 V
Brady Statistic AP VP Percent: 0.02 %
Brady Statistic AP VS Percent: 0.02 %
Brady Statistic AS VP Percent: 98.43 %
Brady Statistic AS VS Percent: 1.53 %
Brady Statistic RA Percent Paced: 0.03 %
Brady Statistic RV Percent Paced: 18.87 %
Date Time Interrogation Session: 20231208001609
Implantable Lead Connection Status: 753985
Implantable Lead Connection Status: 753985
Implantable Lead Connection Status: 753985
Implantable Lead Implant Date: 20060214
Implantable Lead Implant Date: 20150406
Implantable Lead Implant Date: 20150406
Implantable Lead Location: 753858
Implantable Lead Location: 753859
Implantable Lead Location: 753860
Implantable Lead Model: 4396
Implantable Lead Model: 5076
Implantable Pulse Generator Implant Date: 20210610
Lead Channel Impedance Value: 1292 Ohm
Lead Channel Impedance Value: 1406 Ohm
Lead Channel Impedance Value: 342 Ohm
Lead Channel Impedance Value: 437 Ohm
Lead Channel Impedance Value: 532 Ohm
Lead Channel Impedance Value: 570 Ohm
Lead Channel Impedance Value: 665 Ohm
Lead Channel Impedance Value: 874 Ohm
Lead Channel Impedance Value: 912 Ohm
Lead Channel Pacing Threshold Amplitude: 1.25 V
Lead Channel Pacing Threshold Amplitude: 1.25 V
Lead Channel Pacing Threshold Amplitude: 2 V
Lead Channel Pacing Threshold Pulse Width: 0.4 ms
Lead Channel Pacing Threshold Pulse Width: 0.4 ms
Lead Channel Pacing Threshold Pulse Width: 0.8 ms
Lead Channel Sensing Intrinsic Amplitude: 2.375 mV
Lead Channel Sensing Intrinsic Amplitude: 2.375 mV
Lead Channel Sensing Intrinsic Amplitude: 5.125 mV
Lead Channel Sensing Intrinsic Amplitude: 5.125 mV
Lead Channel Setting Pacing Amplitude: 2.5 V
Lead Channel Setting Pacing Amplitude: 2.5 V
Lead Channel Setting Pacing Amplitude: 2.75 V
Lead Channel Setting Pacing Pulse Width: 0.4 ms
Lead Channel Setting Pacing Pulse Width: 0.8 ms
Lead Channel Setting Sensing Sensitivity: 0.9 mV
Zone Setting Status: 755011

## 2022-09-02 DIAGNOSIS — I129 Hypertensive chronic kidney disease with stage 1 through stage 4 chronic kidney disease, or unspecified chronic kidney disease: Secondary | ICD-10-CM | POA: Diagnosis not present

## 2022-09-02 DIAGNOSIS — R809 Proteinuria, unspecified: Secondary | ICD-10-CM | POA: Diagnosis not present

## 2022-09-02 DIAGNOSIS — E875 Hyperkalemia: Secondary | ICD-10-CM | POA: Diagnosis not present

## 2022-09-02 DIAGNOSIS — N19 Unspecified kidney failure: Secondary | ICD-10-CM | POA: Diagnosis not present

## 2022-09-02 DIAGNOSIS — N189 Chronic kidney disease, unspecified: Secondary | ICD-10-CM | POA: Diagnosis not present

## 2022-09-02 DIAGNOSIS — E1122 Type 2 diabetes mellitus with diabetic chronic kidney disease: Secondary | ICD-10-CM | POA: Diagnosis not present

## 2022-09-02 DIAGNOSIS — E1129 Type 2 diabetes mellitus with other diabetic kidney complication: Secondary | ICD-10-CM | POA: Diagnosis not present

## 2022-09-09 ENCOUNTER — Ambulatory Visit: Payer: Medicare Other | Admitting: Orthopaedic Surgery

## 2022-09-09 DIAGNOSIS — N189 Chronic kidney disease, unspecified: Secondary | ICD-10-CM | POA: Diagnosis not present

## 2022-09-09 DIAGNOSIS — E1122 Type 2 diabetes mellitus with diabetic chronic kidney disease: Secondary | ICD-10-CM | POA: Diagnosis not present

## 2022-09-09 DIAGNOSIS — N19 Unspecified kidney failure: Secondary | ICD-10-CM | POA: Diagnosis not present

## 2022-09-09 DIAGNOSIS — I129 Hypertensive chronic kidney disease with stage 1 through stage 4 chronic kidney disease, or unspecified chronic kidney disease: Secondary | ICD-10-CM | POA: Diagnosis not present

## 2022-09-09 DIAGNOSIS — R809 Proteinuria, unspecified: Secondary | ICD-10-CM | POA: Diagnosis not present

## 2022-09-09 DIAGNOSIS — I5042 Chronic combined systolic (congestive) and diastolic (congestive) heart failure: Secondary | ICD-10-CM | POA: Diagnosis not present

## 2022-09-09 DIAGNOSIS — E1129 Type 2 diabetes mellitus with other diabetic kidney complication: Secondary | ICD-10-CM | POA: Diagnosis not present

## 2022-09-11 NOTE — Progress Notes (Signed)
Remote pacemaker transmission.   

## 2022-09-17 DIAGNOSIS — E1165 Type 2 diabetes mellitus with hyperglycemia: Secondary | ICD-10-CM | POA: Diagnosis not present

## 2022-09-17 DIAGNOSIS — I10 Essential (primary) hypertension: Secondary | ICD-10-CM | POA: Diagnosis not present

## 2022-09-17 DIAGNOSIS — E782 Mixed hyperlipidemia: Secondary | ICD-10-CM | POA: Diagnosis not present

## 2022-09-18 ENCOUNTER — Telehealth: Payer: Self-pay | Admitting: *Deleted

## 2022-09-18 NOTE — Telephone Encounter (Signed)
   Name: [name]  DOB: 1950/12/02  MRN: [id number]  Primary Cardiologist: Cristopher Peru, MD   Preoperative team, please contact this patient and set up a phone call appointment for further preoperative risk assessment. Please obtain consent and complete medication review. Thank you for your help.  I confirm that guidance regarding antiplatelet and oral anticoagulation therapy has been completed and, if necessary, noted below.  Patient may can hold ASA 5-7 days prior to surgery with a plan to resume it as soon as felt to be feasible from a surgical standpoint in the post-operative period.    Mable Fill, Marissa Nestle, NP 09/18/2022, 3:19 PM [facility]

## 2022-09-18 NOTE — Telephone Encounter (Signed)
   Pre-operative Risk Assessment    Patient Name: [name]  DOB: 18-Oct-1950 MRN: [id number]      Request for Surgical Clearance    Procedure:   MEDICAL CANTHOPLASTY; TISSUE TRANSFER NOSE/EAR/LID/LIP; LAYER CLOSURE; EXCISE BENIGN LESION  Date of Surgery:  Clearance 10/09/22                                 Surgeon:  DR. Delia Chimes  Surgeon's Group or Practice Name:  [place] [place] Phone number:  [phone] Fax number:  [phone]   Type of Clearance Requested:   - Medical ; ASA HOLD TIME ( 70-10 DAYS PRIOR TO SURGERY AND 2 DAYS POST OP= TOTAL TIME 10-13 DAYS).   PT HAS PACEMAKER, WILL SEND TO DEVICE CLINIC FOR DEVICE CLEARANCE   Type of Anesthesia:  Not Indicated; LEFT MESSAGE FOR THE REQUESTING OFFICE TO CALL WITH TYPE OF ANESTHESIA BEING USED   Additional requests/questions:    [name]   09/18/2022, 2:49 PM

## 2022-09-21 NOTE — Telephone Encounter (Signed)
Left message to call back to schedule tele pre op appt.  

## 2022-09-22 DIAGNOSIS — I110 Hypertensive heart disease with heart failure: Secondary | ICD-10-CM | POA: Diagnosis not present

## 2022-09-22 DIAGNOSIS — F5101 Primary insomnia: Secondary | ICD-10-CM | POA: Diagnosis not present

## 2022-09-22 DIAGNOSIS — I5032 Chronic diastolic (congestive) heart failure: Secondary | ICD-10-CM | POA: Diagnosis not present

## 2022-09-22 DIAGNOSIS — N189 Chronic kidney disease, unspecified: Secondary | ICD-10-CM | POA: Diagnosis not present

## 2022-09-22 DIAGNOSIS — R809 Proteinuria, unspecified: Secondary | ICD-10-CM | POA: Diagnosis not present

## 2022-09-22 DIAGNOSIS — M79673 Pain in unspecified foot: Secondary | ICD-10-CM | POA: Diagnosis not present

## 2022-09-22 DIAGNOSIS — D649 Anemia, unspecified: Secondary | ICD-10-CM | POA: Diagnosis not present

## 2022-09-22 DIAGNOSIS — I10 Essential (primary) hypertension: Secondary | ICD-10-CM | POA: Diagnosis not present

## 2022-09-22 DIAGNOSIS — F411 Generalized anxiety disorder: Secondary | ICD-10-CM | POA: Diagnosis not present

## 2022-09-22 DIAGNOSIS — K219 Gastro-esophageal reflux disease without esophagitis: Secondary | ICD-10-CM | POA: Diagnosis not present

## 2022-09-22 DIAGNOSIS — E782 Mixed hyperlipidemia: Secondary | ICD-10-CM | POA: Diagnosis not present

## 2022-09-22 DIAGNOSIS — E1122 Type 2 diabetes mellitus with diabetic chronic kidney disease: Secondary | ICD-10-CM | POA: Diagnosis not present

## 2022-09-22 NOTE — Telephone Encounter (Signed)
Left message to call back to schedule tele pre op appt.  

## 2022-09-24 ENCOUNTER — Telehealth: Payer: Self-pay | Admitting: *Deleted

## 2022-09-24 DIAGNOSIS — M545 Low back pain, unspecified: Secondary | ICD-10-CM | POA: Diagnosis not present

## 2022-09-24 DIAGNOSIS — R350 Frequency of micturition: Secondary | ICD-10-CM | POA: Diagnosis not present

## 2022-09-24 NOTE — Telephone Encounter (Signed)
Pt agreeable to tele pre op appt 09/30/22 @ 2 pm. Med rec and consent are done.

## 2022-09-24 NOTE — Telephone Encounter (Signed)
Pt agreeable to tele pre op appt 09/30/22 @ 2 pm. Med rec and consent are done.     Patient Consent for Virtual Visit        [name] has provided verbal consent on 09/24/2022 for a virtual visit (video or telephone).   CONSENT FOR VIRTUAL VISIT FOR:  [name]  By participating in this virtual visit I agree to the following:  I hereby voluntarily request, consent and authorize [facility] and its employed or contracted physicians, physician assistants, nurse practitioners or other licensed health care professionals (the Practitioner), to provide me with telemedicine health care services (the "Services") as deemed necessary by the treating Practitioner. I acknowledge and consent to receive the Services by the Practitioner via telemedicine. I understand that the telemedicine visit will involve communicating with the Practitioner through live audiovisual communication technology and the disclosure of certain medical information by electronic transmission. I acknowledge that I have been given the opportunity to request an in-person assessment or other available alternative prior to the telemedicine visit and am voluntarily participating in the telemedicine visit.  I understand that I have the right to withhold or withdraw my consent to the use of telemedicine in the course of my care at any time, without affecting my right to future care or treatment, and that the Practitioner or I may terminate the telemedicine visit at any time. I understand that I have the right to inspect all information obtained and/or recorded in the course of the telemedicine visit and may receive copies of available information for a reasonable fee.  I understand that some of the potential risks of receiving the Services via telemedicine include:  Delay or interruption in medical evaluation due to technological equipment failure or disruption; Information transmitted may not be sufficient (e.g. poor  resolution of images) to allow for appropriate medical decision making by the Practitioner; and/or  In rare instances, security protocols could fail, causing a breach of personal health information.  Furthermore, I acknowledge that it is my responsibility to provide information about my medical history, conditions and care that is complete and accurate to the best of my ability. I acknowledge that Practitioner's advice, recommendations, and/or decision may be based on factors not within their control, such as incomplete or inaccurate data provided by me or distortions of diagnostic images or specimens that may result from electronic transmissions. I understand that the practice of medicine is not an exact science and that Practitioner makes no warranties or guarantees regarding treatment outcomes. I acknowledge that a copy of this consent can be made available to me via my patient portal ([facility]), or I can request a printed copy by calling the office of [facility].    I understand that my insurance will be billed for this visit.   I have read or had this consent read to me. I understand the contents of this consent, which adequately explains the benefits and risks of the Services being provided via telemedicine.  I have been provided ample opportunity to ask questions regarding this consent and the Services and have had my questions answered to my satisfaction. I give my informed consent for the services to be provided through the use of telemedicine in my medical care

## 2022-09-25 NOTE — Telephone Encounter (Signed)
ADDENDUM: REQUESTING OFFICE CALLED BACK AND LEFT VM WITH TYPE OF ANESTHESIA TO BE USED FOR SURGERY;    ANESTHESIA WILL BE GENERAL

## 2022-09-30 ENCOUNTER — Ambulatory Visit: Payer: Medicare Other | Attending: Cardiovascular Disease | Admitting: Nurse Practitioner

## 2022-09-30 DIAGNOSIS — Z01810 Encounter for preprocedural cardiovascular examination: Secondary | ICD-10-CM | POA: Diagnosis not present

## 2022-09-30 NOTE — Progress Notes (Signed)
Virtual Visit via Telephone Note   Because of [name] co-morbid illnesses, she is at least at moderate risk for complications without adequate follow up.  This format is felt to be most appropriate for this patient at this time.  The patient did not have access to video technology/had technical difficulties with video requiring transitioning to audio format only (telephone).  All issues noted in this document were discussed and addressed.  No physical exam could be performed with this format.  Please refer to the patient's chart for her consent to telehealth for [facility].  Evaluation Performed:  Preoperative cardiovascular risk assessment _____________   Date:  09/30/2022   Patient ID:  [name], DOB May 16, 1951, MRN [id number] Patient Location:  Home Provider location:   Office  Primary Care Provider:  Celene Squibb, MD Primary Cardiologist:  Cristopher Peru, MD  Chief Complaint / Patient Profile   72 y.o. y/o female with a h/o NICM, CRT-D upgrade from PPM, HTN, HLD, DM type II, anxiety who is pending medical canthoplasty and presents today for telephonic preoperative cardiovascular risk assessment.  History of Present Illness    [name] is a 72 y.o. female who presents via audio/video conferencing for a telehealth visit today.  Pt was last seen in cardiology clinic on 06/16/2022 by Dr. Crissie Sickles.  At that time [name] was doing well remaining active and working in her yard.  The patient is now pending procedure as outlined above. Since her last visit, she reports that she is doing well with no new cardiac complaints.  She is staying quite active and uses 5 and 10 pound dumbbells for part of her workout routine weekly.  She is also staying active in her yard and has no complaints of chest discomfort or shortness of breath with these activities.   Past Medical History    Past Medical History:  Diagnosis Date   Anxiety    Arthritis     Cardiomyopathy    Cervical high risk HPV (human papillomavirus) test positive 01/30/2015   CHF (congestive heart failure) (HCC)    Chronic alcohol abuse 06/09/2020   Chronic constipation    Chronic kidney disease    Chronic lower back pain    Constipation 01/30/2015   Depression    GERD (gastroesophageal reflux disease)    Heart failure    Chronic combined   Heart murmur    HTN (hypertension)    Hypercholesteremia    Neurogenic bladder    2008 nerve damage s/p back ssurgery   Presence of permanent cardiac pacemaker    Type II diabetes mellitus [facility])    Past Surgical History:  Procedure Laterality Date   ACHILLES TENDON SURGERY Right 06/14/2012   BACK SURGERY  09/15/2003   BI-VENTRICULAR IMPLANTABLE CARDIOVERTER DEFIBRILLATOR  (CRT-D)  12/18/2013   upgrade of previously implanted dual chamber pacemaker to MDT CRTD with RV lead revision by Dr Placido Sou PACEMAKER UPGRADE  12/18/2013   w/defibrillator   BIV PACEMAKER GENERATOR CHANGE OUT N/A 12/18/2013   Procedure: BIV PACEMAKER GENERATOR CHANGE OUT;  Surgeon: Evans Lance, MD;  Location: [facility] CATH LAB;  Service: Cardiovascular;  Laterality: N/A;   BIV PACEMAKER INSERTION CRT-P N/A 02/22/2020   Procedure: DOWNGRADE BIV PACEMAKER INSERTION CRT-P;  Surgeon: Evans Lance, MD;  Location: [facility] CV LAB;  Service: Cardiovascular;  Laterality: N/A;   BREAST BIOPSY Left 09/15/1971   benign   BREAST LUMPECTOMY Left 09/15/1971  CATARACT EXTRACTION Bilateral    CESAREAN SECTION  09/15/1979   CHOLECYSTECTOMY     GASTROCNEMIUS RECESSION  06/24/2012   Procedure: GASTROCNEMIUS SLIDE;  Surgeon: Colin Rhein, MD;  Location: [facility] ORS;  Service: Orthopedics;  Laterality: Right;   HERNIA REPAIR     LACERATION REPAIR Left 02/17/2022   Procedure: REPAIR LEFT CHEEK COMPLEX LACERATION WITH MYRIAD PLACEMENT;  Surgeon: Wallace Going, DO;  Location: [facility];  Service: Plastics;  Laterality: Left;   LEAD REVISION N/A 12/18/2013    Procedure: LEAD REVISION;  Surgeon: Evans Lance, MD;  Location: [facility] CATH LAB;  Service: Cardiovascular;  Laterality: N/A;   PACEMAKER INSERTION  09/14/2004   Dual chamber MDT pacemaker implanted - subsequent upgrade to CRTD 12-2013   POSTERIOR LUMBAR FUSION  2006; 2008   SIGMOIDOSCOPY  MAY 2011 w/ PROP   iTCS-->FSIG-poor bowel prep   SKIN FULL THICKNESS GRAFT Left 04/06/2022   Procedure: SKIN GRAFT FULL THICKNESS;  Surgeon: Wallace Going, DO;  Location: [facility];  Service: Plastics;  Laterality: Left;   TOTAL HIP ARTHROPLASTY Left 06/18/2022   Procedure: LEFT TOTAL HIP ARTHROPLASTY ANTERIOR APPROACH;  Surgeon: Mcarthur Rossetti, MD;  Location: [facility];  Service: Orthopedics;  Laterality: Left;   TUBAL LIGATION  ~ 1984   UPPER GASTROINTESTINAL ENDOSCOPY  MAY 2011 w/ PROP   MILD GASTRITIS 2o ETOH   WISDOM TOOTH EXTRACTION      Allergies  No Known Allergies  Home Medications    Prior to Admission medications   Medication Sig Start Date End Date Taking? Authorizing Provider  aspirin 81 MG chewable tablet Chew 1 tablet (81 mg total) by mouth 2 (two) times daily. 06/20/22   Mcarthur Rossetti, MD  buPROPion (WELLBUTRIN XL) 300 MG 24 hr tablet Take 300 mg by mouth daily. 06/19/20   [provider]  clonazePAM (KLONOPIN) 1 MG tablet Take 1 mg by mouth daily as needed for anxiety.    [provider]  gabapentin (NEURONTIN) 600 MG tablet Take 600 mg by mouth at bedtime.    [provider]  insulin glargine (LANTUS) 100 UNIT/ML injection Inject 6 Units into the skin every morning.    [provider]  insulin lispro (HUMALOG KWIKPEN) 100 UNIT/ML KwikPen Inject 3 Units into the skin 3 (three) times daily. Per sliding scale    [provider]  omeprazole (PRILOSEC) 20 MG capsule Take 20 mg by mouth daily.    [provider]  oxyCODONE (OXY IR/ROXICODONE) 5 MG immediate release tablet Take 1-2 tablets (5-10 mg  total) by mouth every 4 (four) hours as needed for moderate pain (pain score 4-6). 06/20/22   Mcarthur Rossetti, MD  Propylene Glycol (SYSTANE BALANCE) 0.6 % SOLN Place 1 drop into both eyes in the morning and at bedtime.    [provider]  rosuvastatin (CRESTOR) 40 MG tablet TAKE ONE (1) TABLET BY MOUTH EVERY DAY (STOP ATORVASTATIN) Patient taking differently: Take 40 mg by mouth daily. 01/29/20   Corum, Rex Kras, MD  sertraline (ZOLOFT) 100 MG tablet Take 200 mg by mouth daily.    [provider]  sodium bicarbonate 650 MG tablet Take 650 mg by mouth 2 (two) times daily. 09/09/22 09/09/23  [provider]  tiZANidine (ZANAFLEX) 4 MG tablet Take 1 tablet (4 mg total) by mouth every 6 (six) hours as needed for muscle spasms. Take 1 tablet 3 times a day by oral route as needed. Do not drink alcohol  or drive while taking this medication. May cause drowsiness Patient taking differently: Take 4 mg by mouth at bedtime.  Do not drink alcohol or drive while taking this medication. May cause drowsiness 06/20/22   Mcarthur Rossetti, MD  zolpidem (AMBIEN) 10 MG tablet Take 10 mg by mouth at bedtime. 12/04/21   [provider]    Physical Exam    Vital Signs:  Alvie Heidelberg does not have vital signs available for review today.  Given telephonic nature of communication, physical exam is limited. AAOx3. NAD. Normal affect.  Speech and respirations are unlabored.  Accessory Clinical Findings    None  Assessment & Plan    1.  Preoperative Cardiovascular Risk Assessment:    Ms. Annett perioperative risk of a major cardiac event is 11% according to the Revised Cardiac Risk Index (RCRI).  Therefore, she is at high risk for perioperative complications.   Her functional capacity is fair at 6.61 METs according to the [facility] Activity Status Index (DASI). Recommendations: According to ACC/AHA guidelines, no further cardiovascular testing needed.  The patient may  proceed to surgery at acceptable risk.   Antiplatelet and/or Anticoagulation Recommendations: Aspirin can be held for 10 days prior to her surgery.  Please resume Aspirin post operatively when it is felt to be safe from a bleeding standpoint.     The patient was advised that if she develops new symptoms prior to surgery to contact our office to arrange for a follow-up visit, and she verbalized understanding.   A copy of this note will be routed to requesting surgeon.  Time:   Today, I have spent 6 minutes with the patient with telehealth technology discussing medical history, symptoms, and management plan.     Mable Fill, Marissa Nestle, NP  09/30/2022, 8:05 AM

## 2022-10-13 DIAGNOSIS — H353132 Nonexudative age-related macular degeneration, bilateral, intermediate dry stage: Secondary | ICD-10-CM | POA: Diagnosis not present

## 2022-10-13 DIAGNOSIS — H02536 Eyelid retraction left eye, unspecified eyelid: Secondary | ICD-10-CM | POA: Diagnosis not present

## 2022-10-14 DIAGNOSIS — H04123 Dry eye syndrome of bilateral lacrimal glands: Secondary | ICD-10-CM | POA: Diagnosis not present

## 2022-10-14 DIAGNOSIS — H35033 Hypertensive retinopathy, bilateral: Secondary | ICD-10-CM | POA: Diagnosis not present

## 2022-10-14 DIAGNOSIS — H353132 Nonexudative age-related macular degeneration, bilateral, intermediate dry stage: Secondary | ICD-10-CM | POA: Diagnosis not present

## 2022-10-14 DIAGNOSIS — H04202 Unspecified epiphora, left lacrimal gland: Secondary | ICD-10-CM | POA: Diagnosis not present

## 2022-10-14 DIAGNOSIS — E119 Type 2 diabetes mellitus without complications: Secondary | ICD-10-CM | POA: Diagnosis not present

## 2022-10-20 ENCOUNTER — Other Ambulatory Visit (HOSPITAL_COMMUNITY): Payer: Self-pay | Admitting: Internal Medicine

## 2022-10-20 DIAGNOSIS — Z1231 Encounter for screening mammogram for malignant neoplasm of breast: Secondary | ICD-10-CM

## 2022-10-28 ENCOUNTER — Ambulatory Visit (HOSPITAL_COMMUNITY)
Admission: RE | Admit: 2022-10-28 | Discharge: 2022-10-28 | Disposition: A | Discharge status: Home / Self Care | Payer: Medicare Other | Source: Ambulatory Visit | Attending: Internal Medicine | Admitting: Internal Medicine

## 2022-10-28 ENCOUNTER — Encounter (HOSPITAL_COMMUNITY): Payer: Self-pay

## 2022-10-28 DIAGNOSIS — Z1231 Encounter for screening mammogram for malignant neoplasm of breast: Secondary | ICD-10-CM | POA: Diagnosis not present

## 2022-10-28 DIAGNOSIS — R350 Frequency of micturition: Secondary | ICD-10-CM | POA: Diagnosis not present

## 2022-10-29 DIAGNOSIS — R300 Dysuria: Secondary | ICD-10-CM | POA: Diagnosis not present

## 2022-10-29 DIAGNOSIS — R309 Painful micturition, unspecified: Secondary | ICD-10-CM | POA: Diagnosis not present

## 2022-11-11 DIAGNOSIS — I129 Hypertensive chronic kidney disease with stage 1 through stage 4 chronic kidney disease, or unspecified chronic kidney disease: Secondary | ICD-10-CM | POA: Diagnosis not present

## 2022-11-11 DIAGNOSIS — R809 Proteinuria, unspecified: Secondary | ICD-10-CM | POA: Diagnosis not present

## 2022-11-11 DIAGNOSIS — N189 Chronic kidney disease, unspecified: Secondary | ICD-10-CM | POA: Diagnosis not present

## 2022-11-11 DIAGNOSIS — E1122 Type 2 diabetes mellitus with diabetic chronic kidney disease: Secondary | ICD-10-CM | POA: Diagnosis not present

## 2022-11-11 DIAGNOSIS — E1129 Type 2 diabetes mellitus with other diabetic kidney complication: Secondary | ICD-10-CM | POA: Diagnosis not present

## 2022-11-11 DIAGNOSIS — I5042 Chronic combined systolic (congestive) and diastolic (congestive) heart failure: Secondary | ICD-10-CM | POA: Diagnosis not present

## 2022-11-11 DIAGNOSIS — N19 Unspecified kidney failure: Secondary | ICD-10-CM | POA: Diagnosis not present

## 2022-11-12 ENCOUNTER — Encounter: Payer: Self-pay | Admitting: Radiology

## 2022-11-18 DIAGNOSIS — R809 Proteinuria, unspecified: Secondary | ICD-10-CM | POA: Diagnosis not present

## 2022-11-18 DIAGNOSIS — D508 Other iron deficiency anemias: Secondary | ICD-10-CM | POA: Diagnosis not present

## 2022-11-18 DIAGNOSIS — N189 Chronic kidney disease, unspecified: Secondary | ICD-10-CM | POA: Diagnosis not present

## 2022-11-18 DIAGNOSIS — E211 Secondary hyperparathyroidism, not elsewhere classified: Secondary | ICD-10-CM | POA: Diagnosis not present

## 2022-11-18 DIAGNOSIS — E1122 Type 2 diabetes mellitus with diabetic chronic kidney disease: Secondary | ICD-10-CM | POA: Diagnosis not present

## 2022-11-18 DIAGNOSIS — I5042 Chronic combined systolic (congestive) and diastolic (congestive) heart failure: Secondary | ICD-10-CM | POA: Diagnosis not present

## 2022-11-18 DIAGNOSIS — I129 Hypertensive chronic kidney disease with stage 1 through stage 4 chronic kidney disease, or unspecified chronic kidney disease: Secondary | ICD-10-CM | POA: Diagnosis not present

## 2022-11-18 DIAGNOSIS — E1129 Type 2 diabetes mellitus with other diabetic kidney complication: Secondary | ICD-10-CM | POA: Diagnosis not present

## 2022-11-18 DIAGNOSIS — N19 Unspecified kidney failure: Secondary | ICD-10-CM | POA: Diagnosis not present

## 2022-11-19 ENCOUNTER — Telehealth: Payer: Self-pay | Admitting: Pharmacy Technician

## 2022-11-19 ENCOUNTER — Other Ambulatory Visit: Payer: Self-pay

## 2022-11-19 ENCOUNTER — Encounter: Payer: Self-pay | Admitting: Radiology

## 2022-11-19 ENCOUNTER — Encounter (INDEPENDENT_AMBULATORY_CARE_PROVIDER_SITE_OTHER): Payer: Self-pay | Admitting: *Deleted

## 2022-11-19 DIAGNOSIS — D509 Iron deficiency anemia, unspecified: Secondary | ICD-10-CM | POA: Insufficient documentation

## 2022-11-19 DIAGNOSIS — D500 Iron deficiency anemia secondary to blood loss (chronic): Secondary | ICD-10-CM

## 2022-11-19 NOTE — Telephone Encounter (Signed)
Patient will be seen '@AP'$   Auth Submission: NO AUTH NEEDED Payer: MEDICARE A/B & [employer] [place] Medication & CPT/J Code(s) submitted: Feraheme (ferumoxytol) [id number] Route of submission (phone, fax, portal):  Phone # Fax # Auth type: Buy/Bill Units/visits requested:  Reference number:  Approval from: 11/17/22 to 11/17/23

## 2022-11-20 ENCOUNTER — Encounter (INDEPENDENT_AMBULATORY_CARE_PROVIDER_SITE_OTHER): Payer: Medicare Other

## 2022-11-20 DIAGNOSIS — I255 Ischemic cardiomyopathy: Secondary | ICD-10-CM | POA: Diagnosis not present

## 2022-11-20 DIAGNOSIS — D509 Iron deficiency anemia, unspecified: Secondary | ICD-10-CM | POA: Insufficient documentation

## 2022-11-20 LAB — CUP PACEART REMOTE DEVICE CHECK
Battery Remaining Longevity: 73 mo
Battery Voltage: 2.98 V
Brady Statistic AP VP Percent: 0.02 %
Brady Statistic AP VS Percent: 0.02 %
Brady Statistic AS VP Percent: 98.71 %
Brady Statistic AS VS Percent: 1.26 %
Brady Statistic RA Percent Paced: 0.04 %
Brady Statistic RV Percent Paced: 12.93 %
Date Time Interrogation Session: 20240307213040
Implantable Lead Connection Status: 753985
Implantable Lead Connection Status: 753985
Implantable Lead Connection Status: 753985
Implantable Lead Implant Date: 20060214
Implantable Lead Implant Date: 20150406
Implantable Lead Implant Date: 20150406
Implantable Lead Location: 753858
Implantable Lead Location: 753859
Implantable Lead Location: 753860
Implantable Lead Model: 4396
Implantable Lead Model: 5076
Implantable Pulse Generator Implant Date: 20210610
Lead Channel Impedance Value: 1292 Ohm
Lead Channel Impedance Value: 1406 Ohm
Lead Channel Impedance Value: 342 Ohm
Lead Channel Impedance Value: 494 Ohm
Lead Channel Impedance Value: 494 Ohm
Lead Channel Impedance Value: 608 Ohm
Lead Channel Impedance Value: 627 Ohm
Lead Channel Impedance Value: 893 Ohm
Lead Channel Impedance Value: 912 Ohm
Lead Channel Pacing Threshold Amplitude: 1.25 V
Lead Channel Pacing Threshold Amplitude: 1.375 V
Lead Channel Pacing Threshold Amplitude: 1.875 V
Lead Channel Pacing Threshold Pulse Width: 0.4 ms
Lead Channel Pacing Threshold Pulse Width: 0.4 ms
Lead Channel Pacing Threshold Pulse Width: 0.8 ms
Lead Channel Sensing Intrinsic Amplitude: 12.125 mV
Lead Channel Sensing Intrinsic Amplitude: 12.125 mV
Lead Channel Sensing Intrinsic Amplitude: 3.375 mV
Lead Channel Sensing Intrinsic Amplitude: 3.375 mV
Lead Channel Setting Pacing Amplitude: 2.5 V
Lead Channel Setting Pacing Amplitude: 2.5 V
Lead Channel Setting Pacing Amplitude: 3.25 V
Lead Channel Setting Pacing Pulse Width: 0.4 ms
Lead Channel Setting Pacing Pulse Width: 0.8 ms
Lead Channel Setting Sensing Sensitivity: 0.9 mV
Zone Setting Status: 755011

## 2022-11-23 ENCOUNTER — Ambulatory Visit (HOSPITAL_COMMUNITY)
Admission: RE | Admit: 2022-11-23 | Discharge: 2022-11-23 | Disposition: A | Discharge status: Home / Self Care | Payer: Medicare Other | Source: Ambulatory Visit | Attending: Nephrology | Admitting: Nephrology

## 2022-11-23 VITALS — BP 145/72 | HR 85 | Temp 97.8°F | Resp 17

## 2022-11-23 DIAGNOSIS — D62 Acute posthemorrhagic anemia: Secondary | ICD-10-CM

## 2022-11-23 DIAGNOSIS — D500 Iron deficiency anemia secondary to blood loss (chronic): Secondary | ICD-10-CM

## 2022-11-23 DIAGNOSIS — D509 Iron deficiency anemia, unspecified: Secondary | ICD-10-CM | POA: Insufficient documentation

## 2022-11-23 MED ORDER — ACETAMINOPHEN 325 MG PO TABS
650.0000 mg | ORAL_TABLET | Freq: Once | ORAL | Status: AC
  Administered 2022-11-23: 650 mg via ORAL
  Filled 2022-11-23: qty 2

## 2022-11-23 MED ORDER — DIPHENHYDRAMINE HCL 25 MG PO CAPS
25.0000 mg | ORAL_CAPSULE | Freq: Once | ORAL | Status: AC
  Administered 2022-11-23: 25 mg via ORAL
  Filled 2022-11-23: qty 1

## 2022-11-23 MED ORDER — SODIUM CHLORIDE 0.9 % IV SOLN
510.0000 mg | Freq: Once | INTRAVENOUS | Status: AC
  Administered 2022-11-23: 510 mg via INTRAVENOUS
  Filled 2022-11-23: qty 510

## 2022-11-23 NOTE — Progress Notes (Signed)
Diagnosis: Iron Deficiency Anemia  Provider:  Manpreet Bhutani MD  Procedure: Infusion  IV Type: Peripheral, IV Location: R Antecubital  Feraheme (Ferumoxytol), Dose: 510 mg  Infusion Start Time: [id number]  Infusion Stop Time: 1011  Post Infusion IV Care: Observation period completed and Peripheral IV Discontinued  Discharge: Condition: Good, Destination: Home . AVS Provided  Performed by:  Kennith Maes, RN

## 2022-11-30 ENCOUNTER — Encounter (HOSPITAL_COMMUNITY)
Admission: RE | Admit: 2022-11-30 | Discharge: 2022-11-30 | Disposition: A | Discharge status: Home / Self Care | Payer: Medicare Other | Source: Ambulatory Visit | Attending: Nephrology | Admitting: Nephrology

## 2022-11-30 VITALS — BP 148/82 | HR 74 | Temp 97.4°F | Resp 16

## 2022-11-30 DIAGNOSIS — D500 Iron deficiency anemia secondary to blood loss (chronic): Secondary | ICD-10-CM

## 2022-11-30 DIAGNOSIS — D509 Iron deficiency anemia, unspecified: Secondary | ICD-10-CM | POA: Diagnosis not present

## 2022-11-30 DIAGNOSIS — D62 Acute posthemorrhagic anemia: Secondary | ICD-10-CM

## 2022-11-30 MED ORDER — SODIUM CHLORIDE 0.9 % IV SOLN
510.0000 mg | Freq: Once | INTRAVENOUS | Status: AC
  Administered 2022-11-30: 510 mg via INTRAVENOUS
  Filled 2022-11-30: qty 17

## 2022-11-30 MED ORDER — ACETAMINOPHEN 325 MG PO TABS
650.0000 mg | ORAL_TABLET | Freq: Once | ORAL | Status: AC
  Administered 2022-11-30: 650 mg via ORAL

## 2022-11-30 MED ORDER — DIPHENHYDRAMINE HCL 25 MG PO CAPS
25.0000 mg | ORAL_CAPSULE | Freq: Once | ORAL | Status: AC
  Administered 2022-11-30: 25 mg via ORAL

## 2022-11-30 NOTE — Progress Notes (Signed)
Diagnosis: Iron Deficiency Anemia  Provider:  Manpreet Bhutani MD  Procedure: Infusion  IV Type: Peripheral, IV Location: R Antecubital  Feraheme (Ferumoxytol), Dose: 510 mg  Infusion Start Time: [id number]  Infusion Stop Time: 1006  Post Infusion IV Care: Patient declined observation and Peripheral IV Discontinued  Discharge: Condition: Stable, Destination: Home . AVS Declined  Performed by:  Binnie Kand, RN

## 2022-11-30 NOTE — Addendum Note (Signed)
Encounter addended by: Baxter Hire, RN on: 11/30/2022 10:36 AM  Actions taken: Therapy plan modified

## 2022-12-01 ENCOUNTER — Other Ambulatory Visit: Payer: Self-pay

## 2022-12-16 ENCOUNTER — Encounter: Payer: Self-pay | Admitting: Orthopaedic Surgery

## 2022-12-16 ENCOUNTER — Ambulatory Visit (INDEPENDENT_AMBULATORY_CARE_PROVIDER_SITE_OTHER): Payer: Medicare Other | Admitting: Orthopaedic Surgery

## 2022-12-16 ENCOUNTER — Other Ambulatory Visit (INDEPENDENT_AMBULATORY_CARE_PROVIDER_SITE_OTHER): Payer: Medicare Other

## 2022-12-16 DIAGNOSIS — Z96642 Presence of left artificial hip joint: Secondary | ICD-10-CM | POA: Diagnosis not present

## 2022-12-16 NOTE — Progress Notes (Signed)
The patient is here today 6 months status post a left total hip replacement.  She is walking well.  She is only 72 years old and looks much younger.  She reports good range of motion and strength with her left hip.  She is not walking with any significant limp.  Her left hip moves smoothly and fluidly with no blocks to rotation.  Standing likely pelvis and lateral of her left hip shows a well-seated total hip arthroplasty with no complicating features.  At this point follow-up for her left hip can be as needed.  If she does develop any issues at all she knows to let us know.  Of note her right hip has a normal exam and looks normal x-rays.

## 2022-12-16 NOTE — Progress Notes (Signed)
Remote pacemaker transmission.   

## 2023-01-07 ENCOUNTER — Ambulatory Visit (INDEPENDENT_AMBULATORY_CARE_PROVIDER_SITE_OTHER): Payer: Medicare Other | Admitting: Gastroenterology

## 2023-01-07 ENCOUNTER — Encounter (INDEPENDENT_AMBULATORY_CARE_PROVIDER_SITE_OTHER): Payer: Self-pay | Admitting: Gastroenterology

## 2023-01-07 VITALS — BP 107/63 | HR 101 | Temp 97.5°F | Ht 61.0 in | Wt 144.4 lb

## 2023-01-07 DIAGNOSIS — K5904 Chronic idiopathic constipation: Secondary | ICD-10-CM | POA: Diagnosis not present

## 2023-01-07 DIAGNOSIS — D509 Iron deficiency anemia, unspecified: Secondary | ICD-10-CM | POA: Diagnosis not present

## 2023-01-07 MED ORDER — PEG 3350-KCL-NA BICARB-NACL 420 G PO SOLR: 4000.0000 mL | Freq: Once | ORAL | 0 refills | Status: AC

## 2023-01-07 NOTE — Patient Instructions (Addendum)
Schedule EGD and colonoscopy Continue IV iron infusions as scheduled by Dr. Wolfgang Phoenix Start taking Miralax 1 capful every day for one week. If bowel movements do not improve, increase to 2 capfuls every day. If after two weeks there is no improvement, increase to 2 capfuls in AM and one at night.

## 2023-01-07 NOTE — Progress Notes (Signed)
Katrinka Blazing, M.D. Gastroenterology & Hepatology [facility] [address] [place], [place] [address] Primary Care Physician: Benita Stabile, MD [address] [place] [address]  Referring MD: Dr. Wolfgang Phoenix  Chief Complaint: Iron deficiency anemia  History of Present Illness: NATHAN STALLWORTH is a 72 y.o. female with past medical history of anxiety, diastolic heart failure s/p AICD placement, chronic alcohol abuse, GERD depression, hypertension, hyperlipidemia, type 2 diabetes, chronic kidney disease, who presents for evaluation of iron deficiency anemia.  Patient was referred by her nephrologist for evaluation of iron deficiency anemia.  Most recent labs from 11/11/2022 showed an iron saturation of 10%, ferritin 23, hemoglobin 10.9, platelets 272, WBC 5.4.  Given presence of anemia, the patient was referred to our office.  She was started on IV iron.   Patient reports for the last 3-4 months she has been feeling more tired and fatigued. No chest pain or SOB. The patient denies having any nausea, vomiting, fever, chills, hematochezia, melena, hematemesis, abdominal distention, abdominal pain, diarrhea, jaundice, pruritus or weight loss. She has received 2 iron infusions but has not felt any improvement.  Patient has a bowel movement possibly once a week. Sometimes has 2 bowel movements per week when she takes Miralax judiciously.  Last UJW:JXBJY Last Colonoscopy:20 years ago - possibly had poor prep at that time but no report  is available.  FHx: neg for any gastrointestinal/liver disease,brother had breain cancer, siter breast cancer, father lung cancer Social: neg smoking, or illicit drug use. Drinks 1-2 beers 3 times a week Surgical: no abdominal surgeries  Past Medical History: Past Medical History:  Diagnosis Date   Anxiety    Arthritis    Cardiomyopathy    Cervical high risk HPV (human papillomavirus) test positive 01/30/2015    CHF (congestive heart failure)    Chronic alcohol abuse 06/09/2020   Chronic constipation    Chronic kidney disease    Chronic lower back pain    Constipation 01/30/2015   Depression    GERD (gastroesophageal reflux disease)    Heart failure    Chronic combined   Heart murmur    HTN (hypertension)    Hypercholesteremia    Neurogenic bladder    2008 nerve damage s/p back ssurgery   Presence of permanent cardiac pacemaker    Type II diabetes mellitus     Past Surgical History: Past Surgical History:  Procedure Laterality Date   ACHILLES TENDON SURGERY Right 06/14/2012   BACK SURGERY  09/15/2003   BI-VENTRICULAR IMPLANTABLE CARDIOVERTER DEFIBRILLATOR  (CRT-D)  12/18/2013   upgrade of previously implanted dual chamber pacemaker to MDT CRTD with RV lead revision by Dr Jake Bathe PACEMAKER UPGRADE  12/18/2013   w/defibrillator   BIV PACEMAKER GENERATOR CHANGE OUT N/A 12/18/2013   Procedure: BIV PACEMAKER GENERATOR CHANGE OUT;  Surgeon: Marinus Maw, MD;  [facility] CATH LAB;  Service: Cardiovascular;  Laterality: N/A;   BIV PACEMAKER INSERTION CRT-P N/A 02/22/2020   Procedure: DOWNGRADE BIV PACEMAKER INSERTION CRT-P;  Surgeon: Marinus Maw, MD;  Location[facility] INVASIVE CV LAB;  Service: Cardiovascular;  Laterality: N/A;   BREAST BIOPSY Left 09/15/1971   benign   BREAST LUMPECTOMY Left 09/15/1971   CATARACT EXTRACTION Bilateral    CESAREAN SECTION  09/15/1979   CHOLECYSTECTOMY     GASTROCNEMIUS RECESSION  06/24/2012   Procedure: GASTROCNEMIUS SLIDE;  Surgeon: Sherri Rad, MD;  Location: [facility] ORS;  Service: Orthopedics;  Laterality: Right;  HERNIA REPAIR     LACERATION REPAIR Left 02/17/2022   Procedure: REPAIR LEFT CHEEK COMPLEX LACERATION WITH MYRIAD PLACEMENT;  Surgeon: Peggye Form, DO;  Location: [facility] OR;  Service: Plastics;  Laterality: Left;   LEAD REVISION N/A 12/18/2013   Procedure: LEAD REVISION;  Surgeon: Marinus Maw, MD;  Location: [facility]  CATH LAB;  Service: Cardiovascular;  Laterality: N/A;   PACEMAKER INSERTION  09/14/2004   Dual chamber MDT pacemaker implanted - subsequent upgrade to CRTD 12-2013   POSTERIOR LUMBAR FUSION  2006; 2008   SIGMOIDOSCOPY  MAY 2011 w/ PROP   iTCS-->FSIG-poor bowel prep   SKIN FULL THICKNESS GRAFT Left 04/06/2022   Procedure: SKIN GRAFT FULL THICKNESS;  Surgeon: Peggye Form, DO;  Location: [facility];  Service: Plastics;  Laterality: Left;   TOTAL HIP ARTHROPLASTY Left 06/18/2022   Procedure: LEFT TOTAL HIP ARTHROPLASTY ANTERIOR APPROACH;  Surgeon: Kathryne Hitch, MD;  Location: [facility] ORS;  Service: Orthopedics;  Laterality: Left;   TUBAL LIGATION  ~ 1984   UPPER GASTROINTESTINAL ENDOSCOPY  MAY 2011 w/ PROP   MILD GASTRITIS 2o ETOH   WISDOM TOOTH EXTRACTION      Family History: Family History  Problem Relation Age of Onset   Alzheimer's disease Mother    Diabetes Mother    Cancer Father    Cancer Sister        breast   Diabetes Sister    Diabetes Brother    Brain cancer Brother    Diabetes Brother    Colon cancer Neg Hx    Colon polyps Neg Hx     Social History: Social History   Tobacco Use  Smoking Status Never  Smokeless Tobacco Never   Social History   Substance and Sexual Activity  Alcohol Use Yes   Alcohol/week: 6.0 standard drinks of alcohol   Types: 6 Cans of beer per week   Comment: occasionally   Social History   Substance and Sexual Activity  Drug Use No    Allergies: No Known Allergies  Medications: Current Outpatient Medications  Medication Sig Dispense Refill   aspirin 81 MG chewable tablet Chew 1 tablet (81 mg total) by mouth 2 (two) times daily. (Patient taking differently: Chew 81 mg by mouth daily.) 30 tablet 0   buPROPion (WELLBUTRIN XL) 300 MG 24 hr tablet Take 300 mg by mouth daily.     clonazePAM (KLONOPIN) 1 MG tablet Take 1 mg by mouth daily as needed for anxiety.     gabapentin (NEURONTIN) 600 MG tablet Take  600 mg by mouth at bedtime.     insulin glargine (LANTUS) 100 UNIT/ML injection Inject 6 Units into the skin every morning.     insulin lispro (HUMALOG KWIKPEN) 100 UNIT/ML KwikPen Inject 5 Units into the skin 3 (three) times daily. Per sliding scale     omeprazole (PRILOSEC) 20 MG capsule Take 20 mg by mouth daily.     Propylene Glycol (SYSTANE BALANCE) 0.6 % SOLN Place 1 drop into both eyes in the morning and at bedtime.     rosuvastatin (CRESTOR) 40 MG tablet TAKE ONE (1) TABLET BY MOUTH EVERY DAY (STOP ATORVASTATIN) (Patient taking differently: Take 40 mg by mouth daily.) 90 tablet 0   sertraline (ZOLOFT) 100 MG tablet Take 200 mg by mouth daily.     sodium bicarbonate 650 MG tablet Take 650 mg by mouth 2 (two) times daily.     tiZANidine (ZANAFLEX) 4 MG capsule Take 4 mg by  mouth at bedtime.     zolpidem (AMBIEN) 10 MG tablet Take 10 mg by mouth at bedtime.     No current facility-administered medications for this visit.    Review of Systems: GENERAL: negative for malaise, night sweats HEENT: No changes in hearing or vision, no nose bleeds or other nasal problems. NECK: Negative for lumps, goiter, pain and significant neck swelling RESPIRATORY: Negative for cough, wheezing CARDIOVASCULAR: Negative for chest pain, leg swelling, palpitations, orthopnea GI: SEE HPI MUSCULOSKELETAL: Negative for joint pain or swelling, back pain, and muscle pain. SKIN: Negative for lesions, rash PSYCH: Negative for sleep disturbance, mood disorder and recent psychosocial stressors. HEMATOLOGY Negative for prolonged bleeding, bruising easily, and swollen nodes. ENDOCRINE: Negative for cold or heat intolerance, polyuria, polydipsia and goiter. NEURO: negative for tremor, gait imbalance, syncope and seizures. The remainder of the review of systems is noncontributory.   Physical Exam: BP 107/63 (BP Location: Left Arm, Patient Position: Sitting, Cuff Size: Large)   Pulse (!) 101   Temp (!) 97.5 F (36.4  C) (Temporal)   Ht  (1.549 m)   Wt 144 lb 6.4 oz (65.5 kg)   BMI 27.28 kg/m  GENERAL: The patient is AO x3, in no acute distress. HEENT: Head is normocephalic and atraumatic. EOMI are intact. Mouth is well hydrated and without lesions. NECK: Supple. No masses LUNGS: Clear to auscultation. No presence of rhonchi/wheezing/rales. Adequate chest expansion HEART: RRR, normal s1 and s2. ABDOMEN: Soft, nontender, no guarding, no peritoneal signs, and nondistended. BS +. No masses. EXTREMITIES: Without any cyanosis, clubbing, rash, lesions or edema. NEUROLOGIC: AOx3, no focal motor deficit. SKIN: no jaundice, no rashes   Imaging/Labs: as above  I personally reviewed and interpreted the available labs, imaging and endoscopic files.  Impression and Plan: SHANTAL ROAN is a 72 y.o. female with past medical history of anxiety, diastolic heart failure s/p AICD placement, chronic alcohol abuse, GERD depression, hypertension, hyperlipidemia, type 2 diabetes, chronic kidney disease, who presents for evaluation of iron deficiency anemia.  The patient has not presented any overt gastrointestinal bleeding but was found to have mild anemia on most recent blood workup.  Her iron saturation was slightly decreased with rest of iron stores within normal limits.  I consider that her anemia is multifactorial, possibly driven by chronic kidney disease.  However, given presence of mildly decreased iron stores, we will evaluate this further with an EGD and colonoscopy.  She should continue with IV iron infusions scheduled by her nephrologist.  Notably, she has presented some episodes of constipation chronically.  She will benefit from taking MiraLAX on a regular basis.  - Schedule EGD and colonoscopy - Continue IV iron infusions as scheduled by Dr. Wolfgang Phoenix - Start taking Miralax 1 capful every day for one week. If bowel movements do not improve, increase to 2 capfuls every day. If after two weeks there is no  improvement, increase to 2 capfuls in AM and one at night.  All questions were answered.      Katrinka Blazing, MD Gastroenterology and Hepatology [facility]

## 2023-01-20 DIAGNOSIS — H353132 Nonexudative age-related macular degeneration, bilateral, intermediate dry stage: Secondary | ICD-10-CM | POA: Diagnosis not present

## 2023-01-22 ENCOUNTER — Encounter (INDEPENDENT_AMBULATORY_CARE_PROVIDER_SITE_OTHER): Payer: Self-pay

## 2023-01-25 ENCOUNTER — Telehealth (INDEPENDENT_AMBULATORY_CARE_PROVIDER_SITE_OTHER): Payer: Self-pay | Admitting: Gastroenterology

## 2023-01-25 NOTE — Telephone Encounter (Signed)
Pt contacted and made aware of pre op appt for 02/18/23 at 9:30am [facility].

## 2023-02-03 DIAGNOSIS — I10 Essential (primary) hypertension: Secondary | ICD-10-CM | POA: Diagnosis not present

## 2023-02-03 DIAGNOSIS — E139 Other specified diabetes mellitus without complications: Secondary | ICD-10-CM | POA: Diagnosis not present

## 2023-02-03 DIAGNOSIS — E785 Hyperlipidemia, unspecified: Secondary | ICD-10-CM | POA: Diagnosis not present

## 2023-02-03 DIAGNOSIS — E109 Type 1 diabetes mellitus without complications: Secondary | ICD-10-CM | POA: Diagnosis not present

## 2023-02-04 DIAGNOSIS — E139 Other specified diabetes mellitus without complications: Secondary | ICD-10-CM | POA: Diagnosis not present

## 2023-02-15 DIAGNOSIS — D509 Iron deficiency anemia, unspecified: Secondary | ICD-10-CM | POA: Diagnosis not present

## 2023-02-15 DIAGNOSIS — N1832 Chronic kidney disease, stage 3b: Secondary | ICD-10-CM | POA: Diagnosis not present

## 2023-02-15 DIAGNOSIS — N189 Chronic kidney disease, unspecified: Secondary | ICD-10-CM | POA: Diagnosis not present

## 2023-02-15 DIAGNOSIS — R809 Proteinuria, unspecified: Secondary | ICD-10-CM | POA: Diagnosis not present

## 2023-02-15 DIAGNOSIS — I129 Hypertensive chronic kidney disease with stage 1 through stage 4 chronic kidney disease, or unspecified chronic kidney disease: Secondary | ICD-10-CM | POA: Diagnosis not present

## 2023-02-15 DIAGNOSIS — Z79899 Other long term (current) drug therapy: Secondary | ICD-10-CM | POA: Diagnosis not present

## 2023-02-17 NOTE — Patient Instructions (Signed)
[name]  02/17/2023     @PREFPERIOPPHARMACY @   Your procedure is scheduled on  02/23/2023.   [facility] to [name] at  1115  A.M.   Call this number if you have problems the morning of surgery:  [phone]  If you experience any cold or flu symptoms such as cough, fever, chills, shortness of breath, etc. between now and your scheduled surgery, please notify us at the above number.   Remember:  Follow the diet and prep instructions given to you by the office.        Your last dose of Ozempic should  have been on 02/15/2023.         DO NOT take any medications for diabetes the morning of your procedure.    Take these medicines the morning of surgery with A SIP OF WATER        wellbutrin, coreg, clonazepam, hydrocodone(if needed), omeprazole, zoloft.    Do not wear jewelry, make-up or nail polish, including gel polish,  artificial nails, or any other type of covering on natural nails (fingers and  toes).  Do not wear lotions, powders, or perfumes, or deodorant.  Do not shave 48 hours prior to surgery.  Men may shave face and neck.  Do not bring valuables to the hospital.  [facility] is not responsible for any belongings or valuables.  Contacts, dentures or bridgework may not be worn into surgery.  Leave your suitcase in the car.  After surgery it may be brought to your room.  For patients admitted to the hospital, discharge time will be determined by your treatment team.  Patients discharged the day of surgery will not be allowed to drive home and must have someone with them for 24 hours.    Special instructions:   DO NOT smoke tobacco or vape for 24 hours before your procedure.   Please read over the following fact sheets that you were given. Anesthesia Post-op Instructions and Care and Recovery After Surgery        Upper Endoscopy, Adult, Care After After the procedure, it is common to have a sore throat. It is also common to have: Mild  stomach pain or discomfort. Bloating. Nausea. Follow these instructions at home: The instructions below may help you care for yourself at home. Your health care provider may give you more instructions. If you have questions, ask your health care provider. If you were given a sedative during the procedure, it can affect you for several hours. Do not drive or operate machinery until your health care provider says that it is safe. If you will be going home right after the procedure, plan to have a responsible adult: Take you home from the hospital or clinic. You will not be allowed to drive. Care for you for the time you are told. Follow instructions from your health care provider about what you may eat and drink. Return to your normal activities as told by your health care provider. Ask your health care provider what activities are safe for you. Take over-the-counter and prescription medicines only as told by your health care provider. Contact a health care provider if you: Have a sore throat that lasts longer than one day. Have trouble swallowing. Have a fever. Get help right away if you: Vomit blood or your vomit looks like coffee grounds. Have bloody, black, or tarry stools. Have a very bad sore throat or you cannot swallow. Have difficulty breathing or very  bad pain in your chest or abdomen. These symptoms may be an emergency. Get help right away. Call 911. Do not wait to see if the symptoms will go away. Do not drive yourself to the hospital. Summary After the procedure, it is common to have a sore throat, mild stomach discomfort, bloating, and nausea. If you were given a sedative during the procedure, it can affect you for several hours. Do not drive until your health care provider says that it is safe. Follow instructions from your health care provider about what you may eat and drink. Return to your normal activities as told by your health care provider. This information is not  intended to replace advice given to you by your health care provider. Make sure you discuss any questions you have with your health care provider. Document Revised: 12/10/2021 Document Reviewed: 12/10/2021 Elsevier Patient Education  2024 Elsevier Inc. Colonoscopy, Adult, Care After The following information offers guidance on how to care for yourself after your procedure. Your health care provider may also give you more specific instructions. If you have problems or questions, contact your health care provider. What can I expect after the procedure? After the procedure, it is common to have: A small amount of blood in your stool for 24 hours after the procedure. Some gas. Mild cramping or bloating of your abdomen. Follow these instructions at home: Eating and drinking  Drink enough fluid to keep your urine pale yellow. Follow instructions from your health care provider about eating or drinking restrictions. Resume your normal diet as told by your health care provider. Avoid heavy or fried foods that are hard to digest. Activity Rest as told by your health care provider. Avoid sitting for a long time without moving. Get up to take short walks every 1-2 hours. This is important to improve blood flow and breathing. Ask for help if you feel weak or unsteady. Return to your normal activities as told by your health care provider. Ask your health care provider what activities are safe for you. Managing cramping and bloating  Try walking around when you have cramps or feel bloated. If directed, apply heat to your abdomen as told by your health care provider. Use the heat source that your health care provider recommends, such as a moist heat pack or a heating pad. Place a towel between your skin and the heat source. Leave the heat on for 20-30 minutes. Remove the heat if your skin turns bright red. This is especially important if you are unable to feel pain, heat, or cold. You have a greater risk  of getting burned. General instructions If you were given a sedative during the procedure, it can affect you for several hours. Do not drive or operate machinery until your health care provider says that it is safe. For the first 24 hours after the procedure: Do not sign important documents. Do not drink alcohol. Do your regular daily activities at a slower pace than normal. Eat soft foods that are easy to digest. Take over-the-counter and prescription medicines only as told by your health care provider. Keep all follow-up visits. This is important. Contact a health care provider if: You have blood in your stool 2-3 days after the procedure. Get help right away if: You have more than a small spotting of blood in your stool. You have large blood clots in your stool. You have swelling of your abdomen. You have nausea or vomiting. You have a fever. You have increasing pain in your  abdomen that is not relieved with medicine. These symptoms may be an emergency. Get help right away. Call 911. Do not wait to see if the symptoms will go away. Do not drive yourself to the hospital. Summary After the procedure, it is common to have a small amount of blood in your stool. You may also have mild cramping and bloating of your abdomen. If you were given a sedative during the procedure, it can affect you for several hours. Do not drive or operate machinery until your health care provider says that it is safe. Get help right away if you have a lot of blood in your stool, nausea or vomiting, a fever, or increased pain in your abdomen. This information is not intended to replace advice given to you by your health care provider. Make sure you discuss any questions you have with your health care provider. Document Revised: 10/13/2022 Document Reviewed: 04/23/2021 Elsevier Patient Education  2024 Elsevier Inc. Monitored Anesthesia Care, Care After The following information offers guidance on how to care for  yourself after your procedure. Your health care provider may also give you more specific instructions. If you have problems or questions, contact your health care provider. What can I expect after the procedure? After the procedure, it is common to have: Tiredness. Little or no memory about what happened during or after the procedure. Impaired judgment when it comes to making decisions. Nausea or vomiting. Some trouble with balance. Follow these instructions at home: For the time period you were told by your health care provider:  Rest. Do not participate in activities where you could fall or become injured. Do not drive or use machinery. Do not drink alcohol. Do not take sleeping pills or medicines that cause drowsiness. Do not make important decisions or sign legal documents. Do not take care of children on your own. Medicines Take over-the-counter and prescription medicines only as told by your health care provider. If you were prescribed antibiotics, take them as told by your health care provider. Do not stop using the antibiotic even if you start to feel better. Eating and drinking Follow instructions from your health care provider about what you may eat and drink. Drink enough fluid to keep your urine pale yellow. If you vomit: Drink clear fluids slowly and in small amounts as you are able. Clear fluids include water, ice chips, low-calorie sports drinks, and fruit juice that has water added to it (diluted fruit juice). Eat light and bland foods in small amounts as you are able. These foods include bananas, applesauce, rice, lean meats, toast, and crackers. General instructions  Have a responsible adult stay with you for the time you are told. It is important to have someone help care for you until you are awake and alert. If you have sleep apnea, surgery and some medicines can increase your risk for breathing problems. Follow instructions from your health care provider about  wearing your sleep device: When you are sleeping. This includes during daytime naps. While taking prescription pain medicines, sleeping medicines, or medicines that make you drowsy. Do not use any products that contain nicotine or tobacco. These products include cigarettes, chewing tobacco, and vaping devices, such as e-cigarettes. If you need help quitting, ask your health care provider. Contact a health care provider if: You feel nauseous or vomit every time you eat or drink. You feel light-headed. You are still sleepy or having trouble with balance after 24 hours. You get a rash. You have a fever. You  have redness or swelling around the IV site. Get help right away if: You have trouble breathing. You have new confusion after you get home. These symptoms may be an emergency. Get help right away. Call 911. Do not wait to see if the symptoms will go away. Do not drive yourself to the hospital. This information is not intended to replace advice given to you by your health care provider. Make sure you discuss any questions you have with your health care provider. Document Revised: 01/26/2022 Document Reviewed: 01/26/2022 Elsevier Patient Education  2024 [employer].

## 2023-02-18 ENCOUNTER — Encounter (INDEPENDENT_AMBULATORY_CARE_PROVIDER_SITE_OTHER): Payer: Self-pay

## 2023-02-18 ENCOUNTER — Telehealth (INDEPENDENT_AMBULATORY_CARE_PROVIDER_SITE_OTHER): Payer: Self-pay | Admitting: Gastroenterology

## 2023-02-18 ENCOUNTER — Encounter (HOSPITAL_COMMUNITY)
Admission: RE | Admit: 2023-02-18 | Discharge: 2023-02-18 | Disposition: A | Discharge status: Home / Self Care | Payer: Medicare Other | Source: Ambulatory Visit | Attending: Gastroenterology | Admitting: Gastroenterology

## 2023-02-18 VITALS — BP 115/73 | HR 85 | Temp 97.6°F | Resp 18 | Ht 61.0 in | Wt 144.4 lb

## 2023-02-18 DIAGNOSIS — N184 Chronic kidney disease, stage 4 (severe): Secondary | ICD-10-CM | POA: Diagnosis not present

## 2023-02-18 DIAGNOSIS — E1122 Type 2 diabetes mellitus with diabetic chronic kidney disease: Secondary | ICD-10-CM | POA: Diagnosis not present

## 2023-02-18 DIAGNOSIS — Z01812 Encounter for preprocedural laboratory examination: Secondary | ICD-10-CM | POA: Diagnosis not present

## 2023-02-18 DIAGNOSIS — D509 Iron deficiency anemia, unspecified: Secondary | ICD-10-CM | POA: Insufficient documentation

## 2023-02-18 LAB — CBC WITH DIFFERENTIAL/PLATELET
Abs Immature Granulocytes: 0.02 10*3/uL (ref 0.00–0.07)
Basophils Absolute: 0 10*3/uL (ref 0.0–0.1)
Basophils Relative: 1 %
Eosinophils Absolute: 0.1 10*3/uL (ref 0.0–0.5)
Eosinophils Relative: 2 %
HCT: 39 % (ref 36.0–46.0)
Hemoglobin: 12.6 g/dL (ref 12.0–15.0)
Immature Granulocytes: 0 %
Lymphocytes Relative: 24 %
Lymphs Abs: 1.4 10*3/uL (ref 0.7–4.0)
MCH: 31.7 pg (ref 26.0–34.0)
MCHC: 32.3 g/dL (ref 30.0–36.0)
MCV: 98 fL (ref 80.0–100.0)
Monocytes Absolute: 0.5 10*3/uL (ref 0.1–1.0)
Monocytes Relative: 9 %
Neutro Abs: 3.6 10*3/uL (ref 1.7–7.7)
Neutrophils Relative %: 64 %
Platelets: 205 10*3/uL (ref 150–400)
RBC: 3.98 MIL/uL (ref 3.87–5.11)
RDW: 12.8 % (ref 11.5–15.5)
WBC: 5.7 10*3/uL (ref 4.0–10.5)
nRBC: 0 % (ref 0.0–0.2)

## 2023-02-18 LAB — BASIC METABOLIC PANEL
Anion gap: 9 (ref 5–15)
BUN: 24 mg/dL — ABNORMAL HIGH (ref 8–23)
CO2: 27 mmol/L (ref 22–32)
Calcium: 9.4 mg/dL (ref 8.9–10.3)
Chloride: 103 mmol/L (ref 98–111)
Creatinine, Ser: 1.7 mg/dL — ABNORMAL HIGH (ref 0.44–1.00)
GFR, Estimated: 32 mL/min — ABNORMAL LOW (ref >60)
Glucose, Bld: 116 mg/dL — ABNORMAL HIGH (ref 70–99)
Potassium: 4.5 mmol/L (ref 3.5–5.1)
Sodium: 139 mmol/L (ref 135–145)

## 2023-02-18 NOTE — Progress Notes (Signed)
Patient didn't stop her Ozempic so she will need to be rescheduled.  The Ozempic was started by her primary provider after her GI visit.

## 2023-02-18 NOTE — Telephone Encounter (Signed)
Pt has been rescheduled to 04/13/23. Pt advised if cancellation for something sooner, I would be in touch.

## 2023-02-18 NOTE — Telephone Encounter (Signed)
Melanie Bolus, RN  [name], Melanie Houseman, LPN Patient did not stop her Ozempic so she will need to be rescheduled.  She was started on the Ozempic by her primary provider after her GI visit.   TCS originally scheduled for 02/23/23. Will call pt to reschedule

## 2023-02-19 ENCOUNTER — Encounter (HOSPITAL_COMMUNITY): Payer: Medicare Other

## 2023-02-19 DIAGNOSIS — I255 Ischemic cardiomyopathy: Secondary | ICD-10-CM | POA: Diagnosis not present

## 2023-02-19 LAB — CUP PACEART REMOTE DEVICE CHECK
Battery Remaining Longevity: 73 mo
Battery Voltage: 2.97 V
Brady Statistic AP VP Percent: 0.03 %
Brady Statistic AP VS Percent: 0.02 %
Brady Statistic AS VP Percent: 98.53 %
Brady Statistic AS VS Percent: 1.42 %
Brady Statistic RA Percent Paced: 0.05 %
Brady Statistic RV Percent Paced: 7.85 %
Date Time Interrogation Session: 20240606235715
Implantable Lead Connection Status: 753985
Implantable Lead Connection Status: 753985
Implantable Lead Connection Status: 753985
Implantable Lead Implant Date: 20060214
Implantable Lead Implant Date: 20150406
Implantable Lead Implant Date: 20150406
Implantable Lead Location: 753858
Implantable Lead Location: 753859
Implantable Lead Location: 753860
Implantable Lead Model: 4396
Implantable Lead Model: 5076
Implantable Pulse Generator Implant Date: 20210610
Lead Channel Impedance Value: 1311 Ohm
Lead Channel Impedance Value: 1425 Ohm
Lead Channel Impedance Value: 361 Ohm
Lead Channel Impedance Value: 475 Ohm
Lead Channel Impedance Value: 532 Ohm
Lead Channel Impedance Value: 608 Ohm
Lead Channel Impedance Value: 665 Ohm
Lead Channel Impedance Value: 912 Ohm
Lead Channel Impedance Value: 931 Ohm
Lead Channel Pacing Threshold Amplitude: 1.25 V
Lead Channel Pacing Threshold Amplitude: 1.25 V
Lead Channel Pacing Threshold Amplitude: 1.875 V
Lead Channel Pacing Threshold Pulse Width: 0.4 ms
Lead Channel Pacing Threshold Pulse Width: 0.4 ms
Lead Channel Pacing Threshold Pulse Width: 0.8 ms
Lead Channel Sensing Intrinsic Amplitude: 2.75 mV
Lead Channel Sensing Intrinsic Amplitude: 2.75 mV
Lead Channel Sensing Intrinsic Amplitude: 6.25 mV
Lead Channel Sensing Intrinsic Amplitude: 6.25 mV
Lead Channel Setting Pacing Amplitude: 2.5 V
Lead Channel Setting Pacing Amplitude: 2.5 V
Lead Channel Setting Pacing Amplitude: 2.5 V
Lead Channel Setting Pacing Pulse Width: 0.4 ms
Lead Channel Setting Pacing Pulse Width: 0.8 ms
Lead Channel Setting Sensing Sensitivity: 0.9 mV
Zone Setting Status: 755011

## 2023-02-22 ENCOUNTER — Encounter (INDEPENDENT_AMBULATORY_CARE_PROVIDER_SITE_OTHER): Payer: Self-pay

## 2023-02-22 DIAGNOSIS — E1122 Type 2 diabetes mellitus with diabetic chronic kidney disease: Secondary | ICD-10-CM | POA: Diagnosis not present

## 2023-02-22 DIAGNOSIS — D638 Anemia in other chronic diseases classified elsewhere: Secondary | ICD-10-CM | POA: Diagnosis not present

## 2023-02-22 DIAGNOSIS — N2581 Secondary hyperparathyroidism of renal origin: Secondary | ICD-10-CM | POA: Diagnosis not present

## 2023-02-22 DIAGNOSIS — D509 Iron deficiency anemia, unspecified: Secondary | ICD-10-CM | POA: Diagnosis not present

## 2023-02-22 NOTE — Telephone Encounter (Signed)
Pre op letter appt sent to patient.

## 2023-02-23 DIAGNOSIS — T07XXXA Unspecified multiple injuries, initial encounter: Secondary | ICD-10-CM | POA: Diagnosis not present

## 2023-03-09 NOTE — Progress Notes (Signed)
Remote pacemaker transmission.   

## 2023-03-25 DIAGNOSIS — E1165 Type 2 diabetes mellitus with hyperglycemia: Secondary | ICD-10-CM | POA: Diagnosis not present

## 2023-03-25 DIAGNOSIS — E782 Mixed hyperlipidemia: Secondary | ICD-10-CM | POA: Diagnosis not present

## 2023-03-25 DIAGNOSIS — I10 Essential (primary) hypertension: Secondary | ICD-10-CM | POA: Diagnosis not present

## 2023-03-31 ENCOUNTER — Other Ambulatory Visit: Payer: Self-pay

## 2023-04-06 ENCOUNTER — Telehealth: Payer: Self-pay

## 2023-04-06 NOTE — Telephone Encounter (Signed)
We have not seen this patient? 

## 2023-04-06 NOTE — Telephone Encounter (Signed)
Patient called advising [facility] needed a new Rx for her catheters. Patient was not able to provide the fax number but I have attached the phone number below.    Phone: [phone]    Thank you

## 2023-04-07 DIAGNOSIS — M79673 Pain in unspecified foot: Secondary | ICD-10-CM | POA: Diagnosis not present

## 2023-04-07 DIAGNOSIS — I10 Essential (primary) hypertension: Secondary | ICD-10-CM | POA: Diagnosis not present

## 2023-04-07 DIAGNOSIS — Z0000 Encounter for general adult medical examination without abnormal findings: Secondary | ICD-10-CM | POA: Diagnosis not present

## 2023-04-07 DIAGNOSIS — K219 Gastro-esophageal reflux disease without esophagitis: Secondary | ICD-10-CM | POA: Diagnosis not present

## 2023-04-07 DIAGNOSIS — R809 Proteinuria, unspecified: Secondary | ICD-10-CM | POA: Diagnosis not present

## 2023-04-07 DIAGNOSIS — F5101 Primary insomnia: Secondary | ICD-10-CM | POA: Diagnosis not present

## 2023-04-07 DIAGNOSIS — E782 Mixed hyperlipidemia: Secondary | ICD-10-CM | POA: Diagnosis not present

## 2023-04-07 DIAGNOSIS — R82998 Other abnormal findings in urine: Secondary | ICD-10-CM | POA: Diagnosis not present

## 2023-04-07 DIAGNOSIS — D649 Anemia, unspecified: Secondary | ICD-10-CM | POA: Diagnosis not present

## 2023-04-07 DIAGNOSIS — F411 Generalized anxiety disorder: Secondary | ICD-10-CM | POA: Diagnosis not present

## 2023-04-07 DIAGNOSIS — I4891 Unspecified atrial fibrillation: Secondary | ICD-10-CM | POA: Diagnosis not present

## 2023-04-07 DIAGNOSIS — Z96642 Presence of left artificial hip joint: Secondary | ICD-10-CM | POA: Diagnosis not present

## 2023-04-09 ENCOUNTER — Encounter: Payer: Self-pay | Admitting: Internal Medicine

## 2023-04-09 ENCOUNTER — Other Ambulatory Visit: Payer: Self-pay

## 2023-04-09 ENCOUNTER — Encounter (HOSPITAL_COMMUNITY)
Admission: RE | Admit: 2023-04-09 | Discharge: 2023-04-09 | Disposition: A | Discharge status: Home / Self Care | Payer: Medicare Other | Source: Ambulatory Visit | Attending: Gastroenterology | Admitting: Gastroenterology

## 2023-04-09 ENCOUNTER — Encounter (HOSPITAL_COMMUNITY): Payer: Self-pay

## 2023-04-09 NOTE — Progress Notes (Signed)
PERIOPERATIVE PRESCRIPTION FOR IMPLANTED CARDIAC DEVICE PROGRAMMING  Patient Information: Name:  [name]  DOB:  [birth year]  MRN:  [id number]  Planned Procedure:  ESOPHAGOGASTRODUODENOSCOPY (EGD) WITH PROPOFOL /COLONOSCOPY WITH PROPOFOL  Surgeon:  Dolores Frame, MD  Date of Procedure: 04/13/2023 Position during surgery: left lateral  Cautery will be used.  Device Information:  Clinic EP Physician:  Lewayne Bunting, MD   Device Type:  Pacemaker Manufacturer and Phone #:  Medtronic: [id number] Pacemaker Dependent?:  No. Date of Last Device Check:  02/18/2023 Normal Device Function?:  Yes.    Electrophysiologist's Recommendations:  Have magnet available. Provide continuous ECG monitoring when magnet is used or reprogramming is to be performed.  Procedure should not interfere with device function.  No device programming or magnet placement needed.  Per Device Clinic Standing Orders, Wiliam Ke, RN  11:14 AM 04/09/2023

## 2023-04-13 ENCOUNTER — Encounter (HOSPITAL_COMMUNITY)
Admission: RE | Disposition: A | Discharge status: Home / Self Care | Payer: Self-pay | Source: Home / Self Care | Attending: Gastroenterology

## 2023-04-13 ENCOUNTER — Ambulatory Visit (HOSPITAL_COMMUNITY): Payer: Medicare Other | Admitting: Certified Registered"

## 2023-04-13 ENCOUNTER — Ambulatory Visit (HOSPITAL_BASED_OUTPATIENT_CLINIC_OR_DEPARTMENT_OTHER): Payer: Medicare Other | Admitting: Certified Registered"

## 2023-04-13 ENCOUNTER — Ambulatory Visit (HOSPITAL_COMMUNITY)
Admission: RE | Admit: 2023-04-13 | Discharge: 2023-04-13 | Disposition: A | Discharge status: Home / Self Care | Payer: Medicare Other | Attending: Gastroenterology | Admitting: Gastroenterology

## 2023-04-13 ENCOUNTER — Encounter (HOSPITAL_COMMUNITY): Payer: Self-pay | Admitting: Gastroenterology

## 2023-04-13 ENCOUNTER — Encounter (INDEPENDENT_AMBULATORY_CARE_PROVIDER_SITE_OTHER): Payer: Self-pay | Admitting: *Deleted

## 2023-04-13 DIAGNOSIS — K31819 Angiodysplasia of stomach and duodenum without bleeding: Secondary | ICD-10-CM | POA: Insufficient documentation

## 2023-04-13 DIAGNOSIS — F32A Depression, unspecified: Secondary | ICD-10-CM | POA: Insufficient documentation

## 2023-04-13 DIAGNOSIS — D509 Iron deficiency anemia, unspecified: Secondary | ICD-10-CM | POA: Diagnosis not present

## 2023-04-13 DIAGNOSIS — Z950 Presence of cardiac pacemaker: Secondary | ICD-10-CM | POA: Insufficient documentation

## 2023-04-13 DIAGNOSIS — D631 Anemia in chronic kidney disease: Secondary | ICD-10-CM | POA: Diagnosis not present

## 2023-04-13 DIAGNOSIS — Z794 Long term (current) use of insulin: Secondary | ICD-10-CM | POA: Insufficient documentation

## 2023-04-13 DIAGNOSIS — D126 Benign neoplasm of colon, unspecified: Secondary | ICD-10-CM | POA: Diagnosis not present

## 2023-04-13 DIAGNOSIS — N1832 Chronic kidney disease, stage 3b: Secondary | ICD-10-CM

## 2023-04-13 DIAGNOSIS — K219 Gastro-esophageal reflux disease without esophagitis: Secondary | ICD-10-CM | POA: Diagnosis not present

## 2023-04-13 DIAGNOSIS — I130 Hypertensive heart and chronic kidney disease with heart failure and stage 1 through stage 4 chronic kidney disease, or unspecified chronic kidney disease: Secondary | ICD-10-CM | POA: Diagnosis not present

## 2023-04-13 DIAGNOSIS — F419 Anxiety disorder, unspecified: Secondary | ICD-10-CM | POA: Insufficient documentation

## 2023-04-13 DIAGNOSIS — Z8673 Personal history of transient ischemic attack (TIA), and cerebral infarction without residual deficits: Secondary | ICD-10-CM | POA: Diagnosis not present

## 2023-04-13 DIAGNOSIS — D123 Benign neoplasm of transverse colon: Secondary | ICD-10-CM | POA: Diagnosis not present

## 2023-04-13 DIAGNOSIS — Z7985 Long-term (current) use of injectable non-insulin antidiabetic drugs: Secondary | ICD-10-CM | POA: Diagnosis not present

## 2023-04-13 DIAGNOSIS — E785 Hyperlipidemia, unspecified: Secondary | ICD-10-CM | POA: Insufficient documentation

## 2023-04-13 DIAGNOSIS — I5040 Unspecified combined systolic (congestive) and diastolic (congestive) heart failure: Secondary | ICD-10-CM | POA: Insufficient documentation

## 2023-04-13 DIAGNOSIS — E1122 Type 2 diabetes mellitus with diabetic chronic kidney disease: Secondary | ICD-10-CM | POA: Diagnosis not present

## 2023-04-13 DIAGNOSIS — I5042 Chronic combined systolic (congestive) and diastolic (congestive) heart failure: Secondary | ICD-10-CM

## 2023-04-13 DIAGNOSIS — K3189 Other diseases of stomach and duodenum: Secondary | ICD-10-CM | POA: Insufficient documentation

## 2023-04-13 DIAGNOSIS — D175 Benign lipomatous neoplasm of intra-abdominal organs: Secondary | ICD-10-CM | POA: Diagnosis not present

## 2023-04-13 DIAGNOSIS — K644 Residual hemorrhoidal skin tags: Secondary | ICD-10-CM | POA: Diagnosis not present

## 2023-04-13 DIAGNOSIS — G709 Myoneural disorder, unspecified: Secondary | ICD-10-CM | POA: Insufficient documentation

## 2023-04-13 DIAGNOSIS — N189 Chronic kidney disease, unspecified: Secondary | ICD-10-CM | POA: Insufficient documentation

## 2023-04-13 DIAGNOSIS — E119 Type 2 diabetes mellitus without complications: Secondary | ICD-10-CM | POA: Diagnosis not present

## 2023-04-13 HISTORY — PX: POLYPECTOMY: SHX5525

## 2023-04-13 HISTORY — PX: COLONOSCOPY WITH PROPOFOL: SHX5780

## 2023-04-13 HISTORY — PX: ESOPHAGOGASTRODUODENOSCOPY (EGD) WITH PROPOFOL: SHX5813

## 2023-04-13 HISTORY — PX: BIOPSY: SHX5522

## 2023-04-13 LAB — GLUCOSE, CAPILLARY: Glucose-Capillary: 132 mg/dL — ABNORMAL HIGH (ref 70–99)

## 2023-04-13 LAB — HM COLONOSCOPY

## 2023-04-13 SURGERY — ESOPHAGOGASTRODUODENOSCOPY (EGD) WITH PROPOFOL
Anesthesia: General

## 2023-04-13 MED ORDER — LIDOCAINE HCL (PF) 2 % IJ SOLN
INTRAMUSCULAR | Status: AC
  Filled 2023-04-13: qty 10

## 2023-04-13 MED ORDER — LIDOCAINE HCL (CARDIAC) PF 100 MG/5ML IV SOSY
PREFILLED_SYRINGE | INTRAVENOUS | Status: DC | PRN
  Administered 2023-04-13: 100 mg via INTRAVENOUS

## 2023-04-13 MED ORDER — LACTATED RINGERS IV SOLN: INTRAVENOUS | Status: DC

## 2023-04-13 MED ORDER — PROPOFOL 500 MG/50ML IV EMUL
INTRAVENOUS | Status: AC
  Filled 2023-04-13: qty 100

## 2023-04-13 MED ORDER — PHENYLEPHRINE 80 MCG/ML (10ML) SYRINGE FOR IV PUSH (FOR BLOOD PRESSURE SUPPORT)
PREFILLED_SYRINGE | INTRAVENOUS | Status: AC
  Filled 2023-04-13: qty 10

## 2023-04-13 MED ORDER — PROPOFOL 10 MG/ML IV BOLUS
INTRAVENOUS | Status: DC | PRN
  Administered 2023-04-13: 100 mg via INTRAVENOUS
  Administered 2023-04-13: 50 mg via INTRAVENOUS

## 2023-04-13 MED ORDER — LACTATED RINGERS IV SOLN: INTRAVENOUS | Status: DC | PRN

## 2023-04-13 MED ORDER — PROPOFOL 500 MG/50ML IV EMUL
INTRAVENOUS | Status: DC | PRN
  Administered 2023-04-13: 150 ug/kg/min via INTRAVENOUS

## 2023-04-13 MED ORDER — PHENYLEPHRINE 80 MCG/ML (10ML) SYRINGE FOR IV PUSH (FOR BLOOD PRESSURE SUPPORT)
PREFILLED_SYRINGE | INTRAVENOUS | Status: DC | PRN
  Administered 2023-04-13 (×2): 80 ug via INTRAVENOUS

## 2023-04-13 NOTE — Op Note (Signed)
[facility] Patient Name: [name] Procedure Date: 04/13/2023 11:36 AM MRN: [id number] Date of Birth: [birth year] Attending MD: Katrinka Blazing , , [id number] CSN: [id number] Age: 72 Admit Type: Outpatient Procedure:                Upper GI endoscopy Indications:              Iron deficiency anemia Providers:                Katrinka Blazing, Nena Polio, RN, Pandora Leiter,                            Technician Referring MD:              Medicines:                Monitored Anesthesia Care Complications:            No immediate complications. Estimated Blood Loss:     Estimated blood loss: none. Procedure:                Pre-Anesthesia Assessment:                           - Prior to the procedure, a History and Physical                            was performed, and patient medications, allergies                            and sensitivities were reviewed. The patient's                            tolerance of previous anesthesia was reviewed.                           - The risks and benefits of the procedure and the                            sedation options and risks were discussed with the                            patient. All questions were answered and informed                            consent was obtained.                           - ASA Grade Assessment: III - A patient with severe                            systemic disease.                           After obtaining informed consent, the endoscope was                            passed under direct vision. Throughout the  procedure, the patient's blood pressure, pulse, and                            oxygen saturations were monitored continuously. The                            [id number]-[id number] ([id number]) scope was introduced through the                            mouth, and advanced to the second part of duodenum.                            The upper GI endoscopy was accomplished without                             difficulty. The patient tolerated the procedure                            well. Scope In: 11:48:44 AM Scope Out: 12:00:10 PM Total Procedure Duration: 0 hours 11 minutes 26 seconds  Findings:      The esophagus was normal.      A single small angiodysplastic lesion with no bleeding was found in the       gastric fundus. Coagulation for bleeding prevention using argon plasma       at 0.3 liters/minute and 20 watts was successful.      A small healed ulcer was found in the gastric antrum. The scar tissue       was healthy in appearance. Biopsies were taken with a cold forceps for       Helicobacter pylori testing.      There was a large lipoma, 20 to 30 mm in diameter, in the second portion       of the duodenum. Impression:               - Normal esophagus.                           - A single non-bleeding angiodysplastic lesion in                            the stomach. Treated with argon plasma coagulation                            (APC).                           - Scar in the gastric antrum. Biopsied.                           - Duodenal lipoma. Moderate Sedation:      Per Anesthesia Care Recommendation:           - Discharge patient to home (ambulatory).                           - Resume previous diet.                           -  Await pathology results.                           - Increase Prilosec (omeprazole) 40 mg PO daily for                            4 weeks, then go back to 20 mg daily. Procedure Code(s):        --- Professional ---                           [phone], 59, Esophagogastroduodenoscopy, flexible,                            transoral; with control of bleeding, any method                           43239, Esophagogastroduodenoscopy, flexible,                            transoral; with biopsy, single or multiple Diagnosis Code(s):        --- Professional ---                           I43.329, Angiodysplasia of stomach and duodenum                             without bleeding                           K31.89, Other diseases of stomach and duodenum                           D17.5, Benign lipomatous neoplasm of                            intra-abdominal organs                           D50.9, Iron deficiency anemia, unspecified CPT copyright 2022 American [employer] Association. All rights reserved. The codes documented in this report are preliminary and upon coder review may  be revised to meet current compliance requirements. Katrinka Blazing, MD Katrinka Blazing,  04/13/2023 12:05:02 PM This report has been signed electronically. Number of Addenda: 0

## 2023-04-13 NOTE — Discharge Instructions (Addendum)
You are being discharged to home.  Resume your previous diet.  We are waiting for your pathology results.  Take Prilosec (omeprazole) 40 mg by mouth once a day for four weeks, , then go back to 20 mg daily.  Your physician has recommended a repeat colonoscopy for surveillance based on pathology results.

## 2023-04-13 NOTE — Op Note (Signed)
[facility] Patient Name: [name] Procedure Date: 04/13/2023 11:35 AM MRN: [id number] Date of Birth: November 28, 1950 Attending MD: Katrinka Blazing , , [id number] CSN: [id number] Age: 72 Admit Type: Outpatient Procedure:                Colonoscopy Indications:              Iron deficiency anemia Providers:                Katrinka Blazing, Nena Polio, RN, Pandora Leiter,                            Technician Referring MD:              Medicines:                Monitored Anesthesia Care Complications:            No immediate complications. Estimated Blood Loss:     Estimated blood loss: none. Procedure:                Pre-Anesthesia Assessment:                           - ASA Grade Assessment: II - A patient with mild                            systemic disease.                           After obtaining informed consent, the colonoscope                            was passed under direct vision. Throughout the                            procedure, the patient's blood pressure, pulse, and                            oxygen saturations were monitored continuously. The                            [id number]-HQ190L ([id number]) scope was introduced through                            the anus and advanced to the the cecum, identified                            by appendiceal orifice and ileocecal valve. The                            colonoscopy was performed without difficulty. The                            patient tolerated the procedure well. The quality                            of the bowel preparation was adequate to identify  polyps greater than 5 mm in size. Scope In: 12:06:00 PM Scope Out: 12:28:00 PM Scope Withdrawal Time: 0 hours 13 minutes 59 seconds  Total Procedure Duration: 0 hours 22 minutes 0 seconds  Findings:      Hemorrhoids were found on perianal exam.      Four sessile polyps were found in the transverse colon. The polyps were       3 to 6 mm  in size. These polyps were removed with a cold snare.       Resection and retrieval were complete.      Non-bleeding external hemorrhoids were found during retroflexion. The       hemorrhoids were medium-sized. Impression:               - Hemorrhoids found on perianal exam.                           - Four 3 to 6 mm polyps in the transverse colon,                            removed with a cold snare. Resected and retrieved.                           - Non-bleeding external hemorrhoids. Moderate Sedation:      Per Anesthesia Care Recommendation:           - Discharge patient to home (ambulatory).                           - Resume previous diet.                           - Await pathology results.                           - Repeat colonoscopy for surveillance based on                            pathology results. Procedure Code(s):        --- Professional ---                           [phone], Colonoscopy, flexible; with removal of                            tumor(s), polyp(s), or other lesion(s) by snare                            technique Diagnosis Code(s):        --- Professional ---                           D12.3, Benign neoplasm of transverse colon (hepatic                            flexure or splenic flexure)                           K64.4, Residual  hemorrhoidal skin tags                           D50.9, Iron deficiency anemia, unspecified CPT copyright 2022 American Medical Association. All rights reserved. The codes documented in this report are preliminary and upon coder review may  be revised to meet current compliance requirements. Katrinka Blazing, MD Katrinka Blazing,  04/13/2023 12:35:11 PM This report has been signed electronically. Number of Addenda: 0

## 2023-04-13 NOTE — Transfer of Care (Addendum)
Immediate Anesthesia Transfer of Care Note  Patient: [name]  Procedure(s) Performed: ESOPHAGOGASTRODUODENOSCOPY (EGD) WITH PROPOFOL COLONOSCOPY WITH PROPOFOL BIOPSY POLYPECTOMY  Patient Location: Short Stay  Anesthesia Type:General  Level of Consciousness: awake and patient cooperative  Airway & Oxygen Therapy: Patient Spontanous Breathing and Patient connected to face mask oxygen  Post-op Assessment: Report given to RN and Post -op Vital signs reviewed and stable  Post vital signs: Reviewed and stable  Last Vitals:  Vitals Value Taken Time  BP 110/52 04/13/23   1239  Temp 36.6 04/13/23   1244  Pulse 83 04/13/23   1238  Resp 18 04/13/23   1238  SpO2 100% 04/13/23   1238    Last Pain:  Vitals:   04/13/23 0957  PainSc: 0-No pain         Complications: No notable events documented.

## 2023-04-13 NOTE — Anesthesia Preprocedure Evaluation (Signed)
Anesthesia Evaluation  Patient identified by MRN, date of birth, ID band Patient awake    Reviewed: Allergy & Precautions, H&P , NPO status , Patient's Chart, lab work & pertinent test results, reviewed documented beta blocker date and time   Airway Mallampati: II  TM Distance: >3 FB Neck ROM: full    Dental no notable dental hx.    Pulmonary neg pulmonary ROS   Pulmonary exam normal breath sounds clear to auscultation       Cardiovascular Exercise Tolerance: Good hypertension, +CHF  negative cardio ROS + pacemaker + Valvular Problems/Murmurs  Rhythm:regular Rate:Normal     Neuro/Psych  PSYCHIATRIC DISORDERS Anxiety Depression     Neuromuscular disease CVA negative neurological ROS  negative psych ROS   GI/Hepatic negative GI ROS, Neg liver ROS,GERD  ,,  Endo/Other  negative endocrine ROSdiabetes    Renal/GU Renal diseasenegative Renal ROS  negative genitourinary   Musculoskeletal   Abdominal   Peds  Hematology negative hematology ROS (+) Blood dyscrasia, anemia   Anesthesia Other Findings  1. Left ventricular ejection fraction, by estimation, is 55 to 60%. The  left ventricle has normal function. The left ventricle has no regional  wall motion abnormalities. There is mild concentric left ventricular  hypertrophy. Left ventricular diastolic  parameters are consistent with Grade I diastolic dysfunction (impaired  relaxation).   2. Right ventricular systolic function is normal. The right ventricular  size is normal. There is normal pulmonary artery systolic pressure. The  estimated right ventricular systolic pressure is 34.2 mmHg.   3. The mitral valve is normal in structure. Trivial mitral valve  regurgitation.   4. Tricuspid valve regurgitation is mild to moderate.   5. The aortic valve is tricuspid. Aortic valve regurgitation is not  visualized. Aortic valve sclerosis is present, with no evidence of aortic   valve stenosis.   6. The inferior vena cava is normal in size with <50% respiratory  variability, suggesting right atrial pressure of 8 mmHg.   Comparison(s): Compared to prior TTE in 01/2020, there is no significant  change.    Reproductive/Obstetrics negative OB ROS                             Anesthesia Physical Anesthesia Plan  ASA: 3  Anesthesia Plan: General   Post-op Pain Management:    Induction:   PONV Risk Score and Plan: Propofol infusion  Airway Management Planned:   Additional Equipment:   Intra-op Plan:   Post-operative Plan:   Informed Consent: I have reviewed the patients History and Physical, chart, labs and discussed the procedure including the risks, benefits and alternatives for the proposed anesthesia with the patient or authorized representative who has indicated his/her understanding and acceptance.     Dental Advisory Given  Plan Discussed with: CRNA  Anesthesia Plan Comments:        Anesthesia Quick Evaluation

## 2023-04-13 NOTE — H&P (Signed)
[name] is an 72 y.o. female.   Chief Complaint: iron deficiency anemia HPI: [name] is a 72 y.o. female with past medical history of anxiety, diastolic heart failure s/p AICD placement, chronic alcohol abuse, GERD depression, hypertension, hyperlipidemia, type 2 diabetes, chronic kidney disease, who presents for evaluation of iron deficiency anemia.  The patient denies having any nausea, vomiting, fever, chills, hematochezia, melena, hematemesis, abdominal distention, abdominal pain, diarrhea, jaundice, pruritus or weight loss.   Past Medical History:  Diagnosis Date   Anxiety    Arthritis    Cardiomyopathy    Cervical high risk HPV (human papillomavirus) test positive 01/30/2015   CHF (congestive heart failure) (HCC)    Chronic alcohol abuse 06/09/2020   Chronic constipation    Chronic kidney disease    Chronic lower back pain    Constipation 01/30/2015   Depression    GERD (gastroesophageal reflux disease)    Heart failure    Chronic combined   Heart murmur    HTN (hypertension)    Hypercholesteremia    Neurogenic bladder    2008 nerve damage s/p back ssurgery   Presence of permanent cardiac pacemaker    Type II diabetes mellitus [facility])     Past Surgical History:  Procedure Laterality Date   ACHILLES TENDON SURGERY Right 06/14/2012   BACK SURGERY  09/15/2003   BI-VENTRICULAR IMPLANTABLE CARDIOVERTER DEFIBRILLATOR  (CRT-D)  12/18/2013   upgrade of previously implanted dual chamber pacemaker to MDT CRTD with RV lead revision by Dr Jake Bathe PACEMAKER UPGRADE  12/18/2013   w/defibrillator   BIV PACEMAKER GENERATOR CHANGE OUT N/A 12/18/2013   Procedure: BIV PACEMAKER GENERATOR CHANGE OUT;  Surgeon: Marinus Maw, MD;  Location: [facility] CATH LAB;  Service: Cardiovascular;  Laterality: N/A;   BIV PACEMAKER INSERTION CRT-P N/A 02/22/2020   Procedure: DOWNGRADE BIV PACEMAKER INSERTION CRT-P;  Surgeon: Marinus Maw, MD;  Location: [facility] INVASIVE CV LAB;   Service: Cardiovascular;  Laterality: N/A;   BREAST BIOPSY Left 09/15/1971   benign   BREAST LUMPECTOMY Left 09/15/1971   CATARACT EXTRACTION Bilateral    CESAREAN SECTION  09/15/1979   CHOLECYSTECTOMY     GASTROCNEMIUS RECESSION  06/24/2012   Procedure: GASTROCNEMIUS SLIDE;  Surgeon: Sherri Rad, MD;  Location: [facility] ORS;  Service: Orthopedics;  Laterality: Right;   HERNIA REPAIR     LACERATION REPAIR Left 02/17/2022   Procedure: REPAIR LEFT CHEEK COMPLEX LACERATION WITH MYRIAD PLACEMENT;  Surgeon: Peggye Form, DO;  Location: [facility] OR;  Service: Plastics;  Laterality: Left;   LEAD REVISION N/A 12/18/2013   Procedure: LEAD REVISION;  Surgeon: Marinus Maw, MD;  Location: [facility] CATH LAB;  Service: Cardiovascular;  Laterality: N/A;   PACEMAKER INSERTION  09/14/2004   Dual chamber MDT pacemaker implanted - subsequent upgrade to CRTD 12-2013   POSTERIOR LUMBAR FUSION  2006; 2008   SIGMOIDOSCOPY  MAY 2011 w/ PROP   iTCS-->FSIG-poor bowel prep   SKIN FULL THICKNESS GRAFT Left 04/06/2022   Procedure: SKIN GRAFT FULL THICKNESS;  Surgeon: Peggye Form, DO;  Location: [facility];  Service: Plastics;  Laterality: Left;   TOTAL HIP ARTHROPLASTY Left 06/18/2022   Procedure: LEFT TOTAL HIP ARTHROPLASTY ANTERIOR APPROACH;  Surgeon: Kathryne Hitch, MD;  Location: [facility] ORS;  Service: Orthopedics;  Laterality: Left;   TUBAL LIGATION  ~ 1984   UPPER GASTROINTESTINAL ENDOSCOPY  MAY 2011 w/ PROP   MILD GASTRITIS 2o ETOH   WISDOM TOOTH EXTRACTION  Family History  Problem Relation Age of Onset   Alzheimer's disease Mother    Diabetes Mother    Cancer Father    Cancer Sister        breast   Diabetes Sister    Diabetes Brother    Brain cancer Brother    Diabetes Brother    Colon cancer Neg Hx    Colon polyps Neg Hx    Social History:  reports that she has never smoked. She has never used smokeless tobacco. She reports current alcohol use of about 6.0 standard  drinks of alcohol per week. She reports that she does not use drugs.  Allergies: No Known Allergies  Medications Prior to Admission  Medication Sig Dispense Refill   aspirin 81 MG chewable tablet Chew 1 tablet (81 mg total) by mouth 2 (two) times daily. (Patient taking differently: Chew 81 mg by mouth daily.) 30 tablet 0   buPROPion (WELLBUTRIN XL) 300 MG 24 hr tablet Take 300 mg by mouth daily.     carvedilol (COREG) 12.5 MG tablet Take 12.5 mg by mouth 2 (two) times daily.     clonazePAM (KLONOPIN) 1 MG tablet Take 1 mg by mouth in the morning, at noon, and at bedtime.     furosemide (LASIX) 40 MG tablet Take 40 mg by mouth every other day.     gabapentin (NEURONTIN) 600 MG tablet Take 600 mg by mouth at bedtime.     HYDROcodone-acetaminophen (NORCO) 10-325 MG tablet Take 1 tablet by mouth in the morning, at noon, and at bedtime.     insulin lispro (HUMALOG KWIKPEN) 100 UNIT/ML KwikPen Inject 5 Units into the skin 3 (three) times daily with meals. Per sliding scale     losartan (COZAAR) 25 MG tablet Take 25 mg by mouth daily.     Multiple Vitamins-Minerals (PRESERVISION AREDS PO) Take 1 capsule by mouth in the morning and at bedtime.     omeprazole (PRILOSEC) 20 MG capsule Take 20 mg by mouth daily.     OZEMPIC, 0.25 OR 0.5 MG/DOSE, 2 MG/3ML SOPN Inject 0.25 mg into the skin every Wednesday.     potassium chloride SA (KLOR-CON M) 20 MEQ tablet Take 20 mEq by mouth daily.     Propylene Glycol (SYSTANE BALANCE) 0.6 % SOLN Place 1 drop into both eyes in the morning and at bedtime.     rosuvastatin (CRESTOR) 40 MG tablet TAKE ONE (1) TABLET BY MOUTH EVERY DAY (STOP ATORVASTATIN) (Patient taking differently: Take 40 mg by mouth daily.) 90 tablet 0   sertraline (ZOLOFT) 100 MG tablet Take 200 mg by mouth daily.     sodium bicarbonate 650 MG tablet Take 650 mg by mouth 2 (two) times daily.     tiZANidine (ZANAFLEX) 4 MG capsule Take 4 mg by mouth at bedtime.     zolpidem (AMBIEN) 10 MG tablet Take  15 mg by mouth at bedtime.     insulin glargine (LANTUS) 100 UNIT/ML injection Inject 7 Units into the skin every morning.      Results for orders placed or performed during the hospital encounter of 04/13/23 (from the past 48 hour(s))  Glucose, capillary     Status: Abnormal   Collection Time: 04/13/23 10:00 AM  Result Value Ref Range   Glucose-Capillary 132 (H) 70 - 99 mg/dL    Comment: Glucose reference range applies only to samples taken after fasting for at least 8 hours.   No results found.  Review of Systems  All  other systems reviewed and are negative.   Blood pressure (!) 173/80, pulse 86, temperature 97.6 F (36.4 C), resp. rate 20, height 5\' 1"  (1.549 m), weight 65.5 kg, SpO2 100%. Physical Exam  GENERAL: The patient is AO x3, in no acute distress. HEENT: Head is normocephalic and atraumatic. EOMI are intact. Mouth is well hydrated and without lesions. NECK: Supple. No masses LUNGS: Clear to auscultation. No presence of rhonchi/wheezing/rales. Adequate chest expansion HEART: RRR, normal s1 and s2. ABDOMEN: Soft, nontender, no guarding, no peritoneal signs, and nondistended. BS +. No masses. EXTREMITIES: Without any cyanosis, clubbing, rash, lesions or edema. NEUROLOGIC: AOx3, no focal motor deficit. SKIN: no jaundice, no rashes  Assessment/Plan ALISHBA LITZENBERG is a 72 y.o. female with past medical history of anxiety, diastolic heart failure s/p AICD placement, chronic alcohol abuse, GERD depression, hypertension, hyperlipidemia, type 2 diabetes, chronic kidney disease, who presents for evaluation of iron deficiency anemia. We will proceed with colonoscopy.  Dolores Frame, MD 04/13/2023, 10:52 AM

## 2023-04-15 ENCOUNTER — Encounter (INDEPENDENT_AMBULATORY_CARE_PROVIDER_SITE_OTHER): Payer: Self-pay | Admitting: *Deleted

## 2023-04-15 NOTE — Progress Notes (Signed)
Agree with the path finding of reactive gastropathy

## 2023-04-16 ENCOUNTER — Encounter (HOSPITAL_COMMUNITY): Payer: Self-pay | Admitting: Gastroenterology

## 2023-04-16 NOTE — Anesthesia Postprocedure Evaluation (Signed)
Anesthesia Post Note  Patient: [name]  Procedure(s) Performed: ESOPHAGOGASTRODUODENOSCOPY (EGD) WITH PROPOFOL COLONOSCOPY WITH PROPOFOL BIOPSY POLYPECTOMY  Patient location during evaluation: Phase II Anesthesia Type: General Level of consciousness: awake Pain management: pain level controlled Vital Signs Assessment: post-procedure vital signs reviewed and stable Respiratory status: spontaneous breathing and respiratory function stable Cardiovascular status: blood pressure returned to baseline and stable Postop Assessment: no headache and no apparent nausea or vomiting Anesthetic complications: no Comments: Late entry   No notable events documented.   Last Vitals:  Vitals:   04/13/23 1239 04/13/23 1244  BP: (!) 110/52   Pulse:    Resp:    Temp:  36.6 C  SpO2:      Last Pain:  Vitals:   04/13/23 1238  TempSrc: Oral  PainSc: 0-No pain                 Windell Norfolk

## 2023-05-07 DIAGNOSIS — E119 Type 2 diabetes mellitus without complications: Secondary | ICD-10-CM | POA: Diagnosis not present

## 2023-05-07 DIAGNOSIS — H35033 Hypertensive retinopathy, bilateral: Secondary | ICD-10-CM | POA: Diagnosis not present

## 2023-05-07 DIAGNOSIS — H353132 Nonexudative age-related macular degeneration, bilateral, intermediate dry stage: Secondary | ICD-10-CM | POA: Diagnosis not present

## 2023-05-07 DIAGNOSIS — Z961 Presence of intraocular lens: Secondary | ICD-10-CM | POA: Diagnosis not present

## 2023-05-07 DIAGNOSIS — H43813 Vitreous degeneration, bilateral: Secondary | ICD-10-CM | POA: Diagnosis not present

## 2023-05-21 ENCOUNTER — Encounter (HOSPITAL_COMMUNITY): Payer: Medicare Other

## 2023-05-21 DIAGNOSIS — I255 Ischemic cardiomyopathy: Secondary | ICD-10-CM | POA: Diagnosis not present

## 2023-05-21 LAB — CUP PACEART REMOTE DEVICE CHECK
Battery Remaining Longevity: 68 mo
Battery Voltage: 2.97 V
Brady Statistic AP VP Percent: 0.04 %
Brady Statistic AP VS Percent: 0.02 %
Brady Statistic AS VP Percent: 98.38 %
Brady Statistic AS VS Percent: 1.56 %
Brady Statistic RA Percent Paced: 0.07 %
Brady Statistic RV Percent Paced: 11.48 %
Date Time Interrogation Session: 20240905212117
Implantable Lead Connection Status: 753985
Implantable Lead Connection Status: 753985
Implantable Lead Connection Status: 753985
Implantable Lead Implant Date: 20060214
Implantable Lead Implant Date: 20150406
Implantable Lead Implant Date: 20150406
Implantable Lead Location: 753858
Implantable Lead Location: 753859
Implantable Lead Location: 753860
Implantable Lead Model: 4396
Implantable Lead Model: 5076
Implantable Pulse Generator Implant Date: 20210610
Lead Channel Impedance Value: 1311 Ohm
Lead Channel Impedance Value: 1425 Ohm
Lead Channel Impedance Value: 342 Ohm
Lead Channel Impedance Value: 456 Ohm
Lead Channel Impedance Value: 513 Ohm
Lead Channel Impedance Value: 589 Ohm
Lead Channel Impedance Value: 646 Ohm
Lead Channel Impedance Value: 874 Ohm
Lead Channel Impedance Value: 931 Ohm
Lead Channel Pacing Threshold Amplitude: 1.25 V
Lead Channel Pacing Threshold Amplitude: 1.75 V
Lead Channel Pacing Threshold Amplitude: 2 V
Lead Channel Pacing Threshold Pulse Width: 0.4 ms
Lead Channel Pacing Threshold Pulse Width: 0.4 ms
Lead Channel Pacing Threshold Pulse Width: 0.8 ms
Lead Channel Sensing Intrinsic Amplitude: 16.5 mV
Lead Channel Sensing Intrinsic Amplitude: 16.5 mV
Lead Channel Sensing Intrinsic Amplitude: 2.375 mV
Lead Channel Sensing Intrinsic Amplitude: 2.375 mV
Lead Channel Setting Pacing Amplitude: 2.5 V
Lead Channel Setting Pacing Amplitude: 2.5 V
Lead Channel Setting Pacing Amplitude: 3.5 V
Lead Channel Setting Pacing Pulse Width: 0.4 ms
Lead Channel Setting Pacing Pulse Width: 0.8 ms
Lead Channel Setting Sensing Sensitivity: 0.9 mV
Zone Setting Status: 755011

## 2023-05-24 DIAGNOSIS — D649 Anemia, unspecified: Secondary | ICD-10-CM | POA: Diagnosis not present

## 2023-05-24 DIAGNOSIS — R809 Proteinuria, unspecified: Secondary | ICD-10-CM | POA: Diagnosis not present

## 2023-05-24 DIAGNOSIS — N189 Chronic kidney disease, unspecified: Secondary | ICD-10-CM | POA: Diagnosis not present

## 2023-05-24 DIAGNOSIS — N2581 Secondary hyperparathyroidism of renal origin: Secondary | ICD-10-CM | POA: Diagnosis not present

## 2023-05-24 DIAGNOSIS — E1129 Type 2 diabetes mellitus with other diabetic kidney complication: Secondary | ICD-10-CM | POA: Diagnosis not present

## 2023-05-24 DIAGNOSIS — D509 Iron deficiency anemia, unspecified: Secondary | ICD-10-CM | POA: Diagnosis not present

## 2023-05-24 DIAGNOSIS — E1122 Type 2 diabetes mellitus with diabetic chronic kidney disease: Secondary | ICD-10-CM | POA: Diagnosis not present

## 2023-05-24 DIAGNOSIS — I5042 Chronic combined systolic (congestive) and diastolic (congestive) heart failure: Secondary | ICD-10-CM | POA: Diagnosis not present

## 2023-05-25 NOTE — Progress Notes (Signed)
Remote pacemaker transmission.   

## 2023-05-28 ENCOUNTER — Ambulatory Visit: Payer: Medicare Other | Admitting: Urology

## 2023-05-31 DIAGNOSIS — L851 Acquired keratosis [keratoderma] palmaris et plantaris: Secondary | ICD-10-CM | POA: Diagnosis not present

## 2023-05-31 DIAGNOSIS — M2041 Other hammer toe(s) (acquired), right foot: Secondary | ICD-10-CM | POA: Diagnosis not present

## 2023-05-31 DIAGNOSIS — M79674 Pain in right toe(s): Secondary | ICD-10-CM | POA: Diagnosis not present

## 2023-06-02 DIAGNOSIS — R809 Proteinuria, unspecified: Secondary | ICD-10-CM | POA: Diagnosis not present

## 2023-06-02 DIAGNOSIS — E1122 Type 2 diabetes mellitus with diabetic chronic kidney disease: Secondary | ICD-10-CM | POA: Diagnosis not present

## 2023-06-02 DIAGNOSIS — N2581 Secondary hyperparathyroidism of renal origin: Secondary | ICD-10-CM | POA: Diagnosis not present

## 2023-06-02 DIAGNOSIS — E1129 Type 2 diabetes mellitus with other diabetic kidney complication: Secondary | ICD-10-CM | POA: Diagnosis not present

## 2023-06-08 DIAGNOSIS — N390 Urinary tract infection, site not specified: Secondary | ICD-10-CM | POA: Diagnosis not present

## 2023-06-08 DIAGNOSIS — R82998 Other abnormal findings in urine: Secondary | ICD-10-CM | POA: Diagnosis not present

## 2023-06-08 DIAGNOSIS — M545 Low back pain, unspecified: Secondary | ICD-10-CM | POA: Diagnosis not present

## 2023-06-08 DIAGNOSIS — Z79891 Long term (current) use of opiate analgesic: Secondary | ICD-10-CM | POA: Diagnosis not present

## 2023-06-08 DIAGNOSIS — G8929 Other chronic pain: Secondary | ICD-10-CM | POA: Diagnosis not present

## 2023-06-08 DIAGNOSIS — R309 Painful micturition, unspecified: Secondary | ICD-10-CM | POA: Diagnosis not present

## 2023-06-08 DIAGNOSIS — E139 Other specified diabetes mellitus without complications: Secondary | ICD-10-CM | POA: Diagnosis not present

## 2023-06-08 DIAGNOSIS — I10 Essential (primary) hypertension: Secondary | ICD-10-CM | POA: Diagnosis not present

## 2023-06-08 DIAGNOSIS — E785 Hyperlipidemia, unspecified: Secondary | ICD-10-CM | POA: Diagnosis not present

## 2023-06-14 DIAGNOSIS — Z23 Encounter for immunization: Secondary | ICD-10-CM | POA: Diagnosis not present

## 2023-08-10 DIAGNOSIS — N189 Chronic kidney disease, unspecified: Secondary | ICD-10-CM | POA: Diagnosis not present

## 2023-08-10 DIAGNOSIS — R809 Proteinuria, unspecified: Secondary | ICD-10-CM | POA: Diagnosis not present

## 2023-08-10 DIAGNOSIS — I5042 Chronic combined systolic (congestive) and diastolic (congestive) heart failure: Secondary | ICD-10-CM | POA: Diagnosis not present

## 2023-08-10 DIAGNOSIS — N2581 Secondary hyperparathyroidism of renal origin: Secondary | ICD-10-CM | POA: Diagnosis not present

## 2023-08-10 DIAGNOSIS — E1122 Type 2 diabetes mellitus with diabetic chronic kidney disease: Secondary | ICD-10-CM | POA: Diagnosis not present

## 2023-08-10 DIAGNOSIS — E1129 Type 2 diabetes mellitus with other diabetic kidney complication: Secondary | ICD-10-CM | POA: Diagnosis not present

## 2023-08-11 ENCOUNTER — Ambulatory Visit: Payer: Medicare Other | Attending: Internal Medicine | Admitting: Internal Medicine

## 2023-08-11 ENCOUNTER — Encounter: Payer: Self-pay | Admitting: Internal Medicine

## 2023-08-11 VITALS — BP 126/72 | HR 75 | Ht 62.0 in | Wt 138.0 lb

## 2023-08-11 DIAGNOSIS — I5022 Chronic systolic (congestive) heart failure: Secondary | ICD-10-CM | POA: Insufficient documentation

## 2023-08-11 DIAGNOSIS — Z01810 Encounter for preprocedural cardiovascular examination: Secondary | ICD-10-CM | POA: Insufficient documentation

## 2023-08-11 LAB — CUP PACEART INCLINIC DEVICE CHECK
Battery Remaining Longevity: 52 mo
Battery Voltage: 2.96 V
Brady Statistic AP VP Percent: 0.03 %
Brady Statistic AP VS Percent: 0.02 %
Brady Statistic AS VP Percent: 98.45 %
Brady Statistic AS VS Percent: 1.5 %
Brady Statistic RA Percent Paced: 0.05 %
Brady Statistic RV Percent Paced: 11.19 %
Date Time Interrogation Session: 20241127152405
Implantable Lead Connection Status: 753985
Implantable Lead Connection Status: 753985
Implantable Lead Connection Status: 753985
Implantable Lead Implant Date: 20060214
Implantable Lead Implant Date: 20150406
Implantable Lead Implant Date: 20150406
Implantable Lead Location: 753858
Implantable Lead Location: 753859
Implantable Lead Location: 753860
Implantable Lead Model: 4396
Implantable Lead Model: 5076
Implantable Pulse Generator Implant Date: 20210610
Lead Channel Impedance Value: 1330 Ohm
Lead Channel Impedance Value: 1444 Ohm
Lead Channel Impedance Value: 361 Ohm
Lead Channel Impedance Value: 475 Ohm
Lead Channel Impedance Value: 532 Ohm
Lead Channel Impedance Value: 646 Ohm
Lead Channel Impedance Value: 665 Ohm
Lead Channel Impedance Value: 931 Ohm
Lead Channel Impedance Value: 950 Ohm
Lead Channel Pacing Threshold Amplitude: 1.25 V
Lead Channel Pacing Threshold Amplitude: 1.5 V
Lead Channel Pacing Threshold Amplitude: 2.5 V
Lead Channel Pacing Threshold Pulse Width: 0.4 ms
Lead Channel Pacing Threshold Pulse Width: 0.4 ms
Lead Channel Pacing Threshold Pulse Width: 0.8 ms
Lead Channel Sensing Intrinsic Amplitude: 2.5 mV
Lead Channel Sensing Intrinsic Amplitude: 2.75 mV
Lead Channel Sensing Intrinsic Amplitude: 5.875 mV
Lead Channel Sensing Intrinsic Amplitude: 7 mV
Lead Channel Setting Pacing Amplitude: 2.5 V
Lead Channel Setting Pacing Amplitude: 3 V
Lead Channel Setting Pacing Amplitude: 3.25 V
Lead Channel Setting Pacing Pulse Width: 0.4 ms
Lead Channel Setting Pacing Pulse Width: 0.8 ms
Lead Channel Setting Sensing Sensitivity: 0.9 mV
Zone Setting Status: 755011

## 2023-08-11 NOTE — Patient Instructions (Signed)
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
## 2023-08-16 ENCOUNTER — Other Ambulatory Visit: Payer: Self-pay | Admitting: Podiatry

## 2023-08-17 DIAGNOSIS — Z01812 Encounter for preprocedural laboratory examination: Secondary | ICD-10-CM | POA: Diagnosis not present

## 2023-08-17 DIAGNOSIS — Z7984 Long term (current) use of oral hypoglycemic drugs: Secondary | ICD-10-CM | POA: Diagnosis not present

## 2023-08-17 DIAGNOSIS — E1165 Type 2 diabetes mellitus with hyperglycemia: Secondary | ICD-10-CM | POA: Diagnosis not present

## 2023-08-17 DIAGNOSIS — Z7985 Long-term (current) use of injectable non-insulin antidiabetic drugs: Secondary | ICD-10-CM | POA: Diagnosis not present

## 2023-08-17 DIAGNOSIS — Z01818 Encounter for other preprocedural examination: Secondary | ICD-10-CM | POA: Diagnosis not present

## 2023-08-20 ENCOUNTER — Ambulatory Visit: Payer: Medicare Other

## 2023-08-20 DIAGNOSIS — I255 Ischemic cardiomyopathy: Secondary | ICD-10-CM | POA: Diagnosis not present

## 2023-08-20 LAB — CUP PACEART REMOTE DEVICE CHECK
Battery Remaining Longevity: 65 mo
Battery Voltage: 2.96 V
Brady Statistic AP VP Percent: 0.03 %
Brady Statistic AP VS Percent: 0.02 %
Brady Statistic AS VP Percent: 98.2 %
Brady Statistic AS VS Percent: 1.75 %
Brady Statistic RA Percent Paced: 0.06 %
Brady Statistic RV Percent Paced: 5.12 %
Date Time Interrogation Session: 20241206070749
Implantable Lead Connection Status: 753985
Implantable Lead Connection Status: 753985
Implantable Lead Connection Status: 753985
Implantable Lead Implant Date: 20060214
Implantable Lead Implant Date: 20150406
Implantable Lead Implant Date: 20150406
Implantable Lead Location: 753858
Implantable Lead Location: 753859
Implantable Lead Location: 753860
Implantable Lead Model: 4396
Implantable Lead Model: 5076
Implantable Pulse Generator Implant Date: 20210610
Lead Channel Impedance Value: 1026 Ohm
Lead Channel Impedance Value: 1311 Ohm
Lead Channel Impedance Value: 1444 Ohm
Lead Channel Impedance Value: 342 Ohm
Lead Channel Impedance Value: 532 Ohm
Lead Channel Impedance Value: 589 Ohm
Lead Channel Impedance Value: 665 Ohm
Lead Channel Impedance Value: 703 Ohm
Lead Channel Impedance Value: 931 Ohm
Lead Channel Pacing Threshold Amplitude: 1.25 V
Lead Channel Pacing Threshold Amplitude: 1.25 V
Lead Channel Pacing Threshold Amplitude: 2 V
Lead Channel Pacing Threshold Pulse Width: 0.4 ms
Lead Channel Pacing Threshold Pulse Width: 0.4 ms
Lead Channel Pacing Threshold Pulse Width: 0.8 ms
Lead Channel Sensing Intrinsic Amplitude: 2.625 mV
Lead Channel Sensing Intrinsic Amplitude: 2.625 mV
Lead Channel Sensing Intrinsic Amplitude: 4.875 mV
Lead Channel Sensing Intrinsic Amplitude: 4.875 mV
Lead Channel Setting Pacing Amplitude: 2.5 V
Lead Channel Setting Pacing Amplitude: 2.5 V
Lead Channel Setting Pacing Amplitude: 3 V
Lead Channel Setting Pacing Pulse Width: 0.4 ms
Lead Channel Setting Pacing Pulse Width: 0.8 ms
Lead Channel Setting Sensing Sensitivity: 0.9 mV
Zone Setting Status: 755011

## 2023-08-20 NOTE — Patient Instructions (Addendum)
[name]  08/20/2023     @PREFPERIOPPHARMACY @   Your procedure is scheduled on 08/25/2023.  Report to [name] at 6:30 A.M.  Call this number if you have problems the morning of surgery:  [phone]  If you experience any cold or flu symptoms such as cough, fever, chills, shortness of breath, etc. between now and your scheduled surgery, please notify us at the above number.   Remember:   Do not eat or drink after midnight.   You may drink clear liquids until 4:30 .    Clear liquids allowed are:                    Water, Juice (No red color; non-citric and without pulp; diabetics please choose diet or no sugar options), Carbonated beverages (diabetics please choose diet or no sugar options), Clear Tea (No creamer, milk, or cream, including half & half and powdered creamer), and Clear Sports drink (No red color; diabetics please choose diet or no sugar options)    Take these medicines the morning of surgery with A SIP OF WATER : Wellbutrin Carvedilol Clonazepam Neurontin Prilosec Zoloft and Zanaflex    Do not wear jewelry, make-up or nail polish, including gel polish,  artificial nails, or any other type of covering on natural nails (fingers and  toes).  Do not wear lotions, powders, or perfumes, or deodorant.  Do not shave 48 hours prior to surgery.  Men may shave face and neck.  Do not bring valuables to the hospital.  [facility] is not responsible for any belongings or valuables.  Contacts, dentures or bridgework may not be worn into surgery.  Leave your suitcase in the car.  After surgery it may be brought to your room.  For patients admitted to the hospital, discharge time will be determined by your treatment team.  Patients discharged the day of surgery will not be allowed to drive home.   Name and phone number of your driver:   Family  Special instructions:  N/A  Please read over the following fact sheets that you were given. Care and Recovery After  Surgery   General Anesthesia, Adult General anesthesia is the use of medicine to make you fall asleep (unconscious) for a medical procedure. General anesthesia must be used for certain procedures. It is often recommended for surgery or procedures that: Last a long time. Require you to be still or in an unusual position. Are major and can cause blood loss. Affect your breathing. The medicines used for general anesthesia are called general anesthetics. During general anesthesia, these medicines are given along with medicines that: Prevent pain. Control your blood pressure. Relax your muscles. Prevent nausea and vomiting after the procedure. Tell a health care provider about: Any allergies you have. All medicines you are taking, including vitamins, herbs, eye drops, creams, and over-the-counter medicines. Your history of any: Medical conditions you have, including: High blood pressure. Bleeding problems. Diabetes. Heart or lung conditions, such as: Heart failure. Sleep apnea. Asthma. Chronic obstructive pulmonary disease (COPD). Current or recent illnesses, such as: Upper respiratory, chest, or ear infections. Cough or fever. Tobacco or drug use, including marijuana or alcohol use. Depression or anxiety. Surgeries and types of anesthetics you have had. Problems you or family members have had with anesthetic medicines. Whether you are pregnant or may be pregnant. Whether you have any chipped or loose teeth, dentures, caps, bridgework, or issues with your mouth, swallowing, or choking. What are the risks?  Your health care provider will talk with you about risks. These may include: Allergic reaction to the medicines. Lung and heart problems. Inhaling food or liquid from the stomach into the lungs (aspiration). Nerve injury. Injury to the lips, mouth, teeth, or gums. Stroke. Waking up during your procedure and being unable to move. This is rare. These problems are more likely to  develop if you are having a major surgery or if you have an advanced or serious medical condition. You can prevent some of these complications by answering all of your health care provider's questions thoroughly and by following all instructions before your procedure. General anesthesia can cause side effects, including: Nausea or vomiting. A sore throat or hoarseness from the breathing tube. Wheezing or coughing. Shaking chills or feeling cold. Body aches. Sleepiness. Confusion, agitation (delirium), or anxiety. What happens before the procedure? When to stop eating and drinking Follow instructions from your health care provider about what you may eat and drink before your procedure. If you do not follow your health care provider's instructions, your procedure may be delayed or canceled. Medicines Ask your health care provider about: Changing or stopping your regular medicines. These include any diabetes medicines or blood thinners you take. Taking medicines such as aspirin and ibuprofen. These medicines can thin your blood. Do not take them unless your health care provider tells you to. Taking over-the-counter medicines, vitamins, herbs, and supplements. General instructions Do not use any products that contain nicotine or tobacco for at least 4 weeks before the procedure. These products include cigarettes, chewing tobacco, and vaping devices, such as e-cigarettes. If you need help quitting, ask your health care provider. If you brush your teeth on the morning of the procedure, make sure to spit out all of the water and toothpaste. If told by your health care provider, bring your sleep apnea device with you to surgery (if applicable). If you will be going home right after the procedure, plan to have a responsible adult: Take you home from the hospital or clinic. You will not be allowed to drive. Care for you for the time you are told. What happens during the procedure?  An IV will be  inserted into one of your veins. You will be given one or more of the following through a face mask or IV: A sedative. This helps you relax. Anesthesia. This will: Numb certain areas of your body. Make you fall asleep for surgery. After you are unconscious, a breathing tube may be inserted down your throat to help you breathe. This will be removed before you wake up. An anesthesia provider, such as an anesthesiologist, will stay with you throughout your procedure. The anesthesia provider will: Keep you comfortable and safe by continuing to give you medicines and adjusting the amount of medicine that you get. Monitor your blood pressure, heart rate, and oxygen levels to make sure that the anesthetics do not cause any problems. The procedure may vary among health care providers and hospitals. What happens after the procedure? Your blood pressure, temperature, heart rate, breathing rate, and blood oxygen level will be monitored until you leave the hospital or clinic. You will wake up in a recovery area. You may wake up slowly. You may be given medicine to help you with pain, nausea, or any other side effects from the anesthesia. Summary General anesthesia is the use of medicine to make you fall asleep (unconscious) for a medical procedure. Follow your health care provider's instructions about when to stop eating, drinking,  or taking certain medicines before your procedure. Plan to have a responsible adult take you home from the hospital or clinic. This information is not intended to replace advice given to you by your health care provider. Make sure you discuss any questions you have with your health care provider. Document Revised: 11/27/2021 Document Reviewed: 11/27/2021 Elsevier Patient Education  2024 [employer]  How to Use Chlorhexidine Before Surgery Chlorhexidine gluconate (CHG) is a germ-killing (antiseptic) solution that is used to clean the skin. It can get rid of the bacteria that  normally live on the skin and can keep them away for about 24 hours. To clean your skin with CHG, you may be given: A CHG solution to use in the shower or as part of a sponge bath. A prepackaged cloth that contains CHG. Cleaning your skin with CHG may help lower the risk for infection: While you are staying in the intensive care unit of the hospital. If you have a vascular access, such as a central line, to provide short-term or long-term access to your veins. If you have a catheter to drain urine from your bladder. If you are on a ventilator. A ventilator is a machine that helps you breathe by moving air in and out of your lungs. After surgery. What are the risks? Risks of using CHG include: A skin reaction. Hearing loss, if CHG gets in your ears and you have a perforated eardrum. Eye injury, if CHG gets in your eyes and is not rinsed out. The CHG product catching fire. Make sure that you avoid smoking and flames after applying CHG to your skin. Do not use CHG: If you have a chlorhexidine allergy or have previously reacted to chlorhexidine. On babies younger than 38 months of age. How to use CHG solution Use CHG only as told by your health care provider, and follow the instructions on the label. Use the full amount of CHG as directed. Usually, this is one bottle. During a shower Follow these steps when using CHG solution during a shower (unless your health care provider gives you different instructions): Start the shower. Use your normal soap and shampoo to wash your face and hair. Turn off the shower or move out of the shower stream. Pour the CHG onto a clean washcloth. Do not use any type of brush or rough-edged sponge. Starting at your neck, lather your body down to your toes. Make sure you follow these instructions: If you will be having surgery, pay special attention to the part of your body where you will be having surgery. Scrub this area for at least 1 minute. Do not use CHG on  your head or face. If the solution gets into your ears or eyes, rinse them well with water. Avoid your genital area. Avoid any areas of skin that have broken skin, cuts, or scrapes. Scrub your back and under your arms. Make sure to wash skin folds. Let the lather sit on your skin for 1-2 minutes or as long as told by your health care provider. Thoroughly rinse your entire body in the shower. Make sure that all body creases and crevices are rinsed well. Dry off with a clean towel. Do not put any substances on your body afterward--such as powder, lotion, or perfume--unless you are told to do so by your health care provider. Only use lotions that are recommended by the manufacturer. Put on clean clothes or pajamas. If it is the night before your surgery, sleep in clean sheets.  During  a sponge bath Follow these steps when using CHG solution during a sponge bath (unless your health care provider gives you different instructions): Use your normal soap and shampoo to wash your face and hair. Pour the CHG onto a clean washcloth. Starting at your neck, lather your body down to your toes. Make sure you follow these instructions: If you will be having surgery, pay special attention to the part of your body where you will be having surgery. Scrub this area for at least 1 minute. Do not use CHG on your head or face. If the solution gets into your ears or eyes, rinse them well with water. Avoid your genital area. Avoid any areas of skin that have broken skin, cuts, or scrapes. Scrub your back and under your arms. Make sure to wash skin folds. Let the lather sit on your skin for 1-2 minutes or as long as told by your health care provider. Using a different clean, wet washcloth, thoroughly rinse your entire body. Make sure that all body creases and crevices are rinsed well. Dry off with a clean towel. Do not put any substances on your body afterward--such as powder, lotion, or perfume--unless you are told to  do so by your health care provider. Only use lotions that are recommended by the manufacturer. Put on clean clothes or pajamas. If it is the night before your surgery, sleep in clean sheets. How to use CHG prepackaged cloths Only use CHG cloths as told by your health care provider, and follow the instructions on the label. Use the CHG cloth on clean, dry skin. Do not use the CHG cloth on your head or face unless your health care provider tells you to. When washing with the CHG cloth: Avoid your genital area. Avoid any areas of skin that have broken skin, cuts, or scrapes. Before surgery Follow these steps when using a CHG cloth to clean before surgery (unless your health care provider gives you different instructions): Using the CHG cloth, vigorously scrub the part of your body where you will be having surgery. Scrub using a back-and-forth motion for 3 minutes. The area on your body should be completely wet with CHG when you are done scrubbing. Do not rinse. Discard the cloth and let the area air-dry. Do not put any substances on the area afterward, such as powder, lotion, or perfume. Put on clean clothes or pajamas. If it is the night before your surgery, sleep in clean sheets.  For general bathing Follow these steps when using CHG cloths for general bathing (unless your health care provider gives you different instructions). Use a separate CHG cloth for each area of your body. Make sure you wash between any folds of skin and between your fingers and toes. Wash your body in the following order, switching to a new cloth after each step: The front of your neck, shoulders, and chest. Both of your arms, under your arms, and your hands. Your stomach and groin area, avoiding the genitals. Your right leg and foot. Your left leg and foot. The back of your neck, your back, and your buttocks. Do not rinse. Discard the cloth and let the area air-dry. Do not put any substances on your body  afterward--such as powder, lotion, or perfume--unless you are told to do so by your health care provider. Only use lotions that are recommended by the manufacturer. Put on clean clothes or pajamas. Contact a health care provider if: Your skin gets irritated after scrubbing. You have questions about  using your solution or cloth. You swallow any chlorhexidine. Call your local poison control center ([phone] in the [place] Get help right away if: Your eyes itch badly, or they become very red or swollen. Your skin itches badly and is red or swollen. Your hearing changes. You have trouble seeing. You have swelling or tingling in your mouth or throat. You have trouble breathing. These symptoms may represent a serious problem that is an emergency. Do not wait to see if the symptoms will go away. Get medical help right away. Call your local emergency services (911 in the [place] Do not drive yourself to the hospital. Summary Chlorhexidine gluconate (CHG) is a germ-killing (antiseptic) solution that is used to clean the skin. Cleaning your skin with CHG may help to lower your risk for infection. You may be given CHG to use for bathing. It may be in a bottle or in a prepackaged cloth to use on your skin. Carefully follow your health care provider's instructions and the instructions on the product label. Do not use CHG if you have a chlorhexidine allergy. Contact your health care provider if your skin gets irritated after scrubbing. This information is not intended to replace advice given to you by your health care provider. Make sure you discuss any questions you have with your health care provider. Document Revised: 12/29/2021 Document Reviewed: 11/11/2020 Elsevier Patient Education  2023 [employer].

## 2023-08-21 DIAGNOSIS — Z6825 Body mass index (BMI) 25.0-25.9, adult: Secondary | ICD-10-CM | POA: Diagnosis not present

## 2023-08-21 DIAGNOSIS — E663 Overweight: Secondary | ICD-10-CM | POA: Diagnosis not present

## 2023-08-21 DIAGNOSIS — J069 Acute upper respiratory infection, unspecified: Secondary | ICD-10-CM | POA: Diagnosis not present

## 2023-08-21 DIAGNOSIS — R030 Elevated blood-pressure reading, without diagnosis of hypertension: Secondary | ICD-10-CM | POA: Diagnosis not present

## 2023-08-21 DIAGNOSIS — R059 Cough, unspecified: Secondary | ICD-10-CM | POA: Diagnosis not present

## 2023-08-21 DIAGNOSIS — J101 Influenza due to other identified influenza virus with other respiratory manifestations: Secondary | ICD-10-CM | POA: Diagnosis not present

## 2023-08-21 DIAGNOSIS — U071 COVID-19: Secondary | ICD-10-CM | POA: Diagnosis not present

## 2023-08-23 ENCOUNTER — Ambulatory Visit (HOSPITAL_COMMUNITY)
Admission: RE | Admit: 2023-08-23 | Discharge: 2023-08-23 | Disposition: A | Discharge status: Home / Self Care | Payer: Medicare Other | Source: Ambulatory Visit | Attending: Podiatry | Admitting: Podiatry

## 2023-08-23 ENCOUNTER — Encounter (HOSPITAL_COMMUNITY)
Admission: RE | Admit: 2023-08-23 | Discharge: 2023-08-23 | Disposition: A | Discharge status: Home / Self Care | Payer: Medicare Other | Source: Ambulatory Visit | Attending: Podiatry | Admitting: Podiatry

## 2023-08-23 ENCOUNTER — Other Ambulatory Visit (HOSPITAL_COMMUNITY): Payer: Self-pay | Admitting: Podiatry

## 2023-08-23 ENCOUNTER — Encounter (HOSPITAL_COMMUNITY): Payer: Self-pay

## 2023-08-23 VITALS — BP 126/72 | HR 75 | Temp 97.8°F | Resp 18 | Ht 62.0 in | Wt 138.0 lb

## 2023-08-23 DIAGNOSIS — M2041 Other hammer toe(s) (acquired), right foot: Secondary | ICD-10-CM

## 2023-08-23 DIAGNOSIS — E1100 Type 2 diabetes mellitus with hyperosmolarity without nonketotic hyperglycemic-hyperosmolar coma (NKHHC): Secondary | ICD-10-CM | POA: Diagnosis not present

## 2023-08-23 DIAGNOSIS — Z01818 Encounter for other preprocedural examination: Secondary | ICD-10-CM | POA: Diagnosis not present

## 2023-08-23 DIAGNOSIS — M79674 Pain in right toe(s): Secondary | ICD-10-CM | POA: Diagnosis not present

## 2023-08-23 DIAGNOSIS — L851 Acquired keratosis [keratoderma] palmaris et plantaris: Secondary | ICD-10-CM | POA: Diagnosis not present

## 2023-08-23 DIAGNOSIS — M7731 Calcaneal spur, right foot: Secondary | ICD-10-CM | POA: Diagnosis not present

## 2023-08-23 LAB — BASIC METABOLIC PANEL
Anion gap: 8 (ref 5–15)
BUN: 36 mg/dL — ABNORMAL HIGH (ref 8–23)
CO2: 22 mmol/L (ref 22–32)
Calcium: 9.3 mg/dL (ref 8.9–10.3)
Chloride: 108 mmol/L (ref 98–111)
Creatinine, Ser: 1.75 mg/dL — ABNORMAL HIGH (ref 0.44–1.00)
GFR, Estimated: 31 mL/min — ABNORMAL LOW (ref >60)
Glucose, Bld: 234 mg/dL — ABNORMAL HIGH (ref 70–99)
Potassium: 4.4 mmol/L (ref 3.5–5.1)
Sodium: 138 mmol/L (ref 135–145)

## 2023-08-23 LAB — HEMOGLOBIN A1C
Hgb A1c MFr Bld: 6.5 % — ABNORMAL HIGH (ref 4.8–5.6)
Mean Plasma Glucose: 139.85 mg/dL

## 2023-08-25 ENCOUNTER — Ambulatory Visit (HOSPITAL_COMMUNITY): Payer: Medicare Other | Admitting: Certified Registered"

## 2023-08-25 ENCOUNTER — Encounter (HOSPITAL_COMMUNITY)
Admission: RE | Disposition: A | Discharge status: Home / Self Care | Payer: Self-pay | Source: Home / Self Care | Attending: Podiatry

## 2023-08-25 ENCOUNTER — Encounter (HOSPITAL_COMMUNITY): Payer: Self-pay

## 2023-08-25 ENCOUNTER — Ambulatory Visit (HOSPITAL_COMMUNITY): Payer: Medicare Other

## 2023-08-25 ENCOUNTER — Ambulatory Visit (HOSPITAL_BASED_OUTPATIENT_CLINIC_OR_DEPARTMENT_OTHER): Payer: Medicare Other | Admitting: Certified Registered"

## 2023-08-25 ENCOUNTER — Ambulatory Visit (HOSPITAL_COMMUNITY)
Admission: RE | Admit: 2023-08-25 | Discharge: 2023-08-25 | Disposition: A | Discharge status: Home / Self Care | Payer: Medicare Other | Attending: Podiatry | Admitting: Podiatry

## 2023-08-25 DIAGNOSIS — M2041 Other hammer toe(s) (acquired), right foot: Secondary | ICD-10-CM | POA: Insufficient documentation

## 2023-08-25 DIAGNOSIS — F419 Anxiety disorder, unspecified: Secondary | ICD-10-CM | POA: Insufficient documentation

## 2023-08-25 DIAGNOSIS — Z950 Presence of cardiac pacemaker: Secondary | ICD-10-CM | POA: Insufficient documentation

## 2023-08-25 DIAGNOSIS — M19071 Primary osteoarthritis, right ankle and foot: Secondary | ICD-10-CM | POA: Diagnosis not present

## 2023-08-25 DIAGNOSIS — S92511A Displaced fracture of proximal phalanx of right lesser toe(s), initial encounter for closed fracture: Secondary | ICD-10-CM | POA: Diagnosis not present

## 2023-08-25 DIAGNOSIS — F32A Depression, unspecified: Secondary | ICD-10-CM | POA: Insufficient documentation

## 2023-08-25 DIAGNOSIS — K219 Gastro-esophageal reflux disease without esophagitis: Secondary | ICD-10-CM | POA: Diagnosis not present

## 2023-08-25 DIAGNOSIS — D649 Anemia, unspecified: Secondary | ICD-10-CM | POA: Insufficient documentation

## 2023-08-25 DIAGNOSIS — L851 Acquired keratosis [keratoderma] palmaris et plantaris: Secondary | ICD-10-CM | POA: Diagnosis not present

## 2023-08-25 DIAGNOSIS — I110 Hypertensive heart disease with heart failure: Secondary | ICD-10-CM | POA: Insufficient documentation

## 2023-08-25 DIAGNOSIS — I509 Heart failure, unspecified: Secondary | ICD-10-CM | POA: Diagnosis not present

## 2023-08-25 DIAGNOSIS — M205X1 Other deformities of toe(s) (acquired), right foot: Secondary | ICD-10-CM | POA: Diagnosis not present

## 2023-08-25 DIAGNOSIS — M79674 Pain in right toe(s): Secondary | ICD-10-CM | POA: Diagnosis not present

## 2023-08-25 HISTORY — PX: FLEXOR TENOTOMY: SHX6342

## 2023-08-25 HISTORY — PX: TOE ARTHROPLASTY: SHX6504

## 2023-08-25 LAB — GLUCOSE, CAPILLARY
Glucose-Capillary: 165 mg/dL — ABNORMAL HIGH (ref 70–99)
Glucose-Capillary: 166 mg/dL — ABNORMAL HIGH (ref 70–99)

## 2023-08-25 SURGERY — ARTHROPLASTY, TOE
Anesthesia: General | Site: Toe | Laterality: Right

## 2023-08-25 MED ORDER — EPHEDRINE 5 MG/ML INJ
INTRAVENOUS | Status: AC
Start: 2023-08-25 — End: ?
  Filled 2023-08-25: qty 5

## 2023-08-25 MED ORDER — DEXMEDETOMIDINE HCL IN NACL 80 MCG/20ML IV SOLN
INTRAVENOUS | Status: AC
Start: 2023-08-25 — End: ?
  Filled 2023-08-25: qty 20

## 2023-08-25 MED ORDER — FENTANYL CITRATE (PF) 100 MCG/2ML IJ SOLN
INTRAMUSCULAR | Status: DC | PRN
  Administered 2023-08-25: 50 ug via INTRAVENOUS

## 2023-08-25 MED ORDER — LIDOCAINE HCL 1 % IJ SOLN
INTRAMUSCULAR | Status: DC | PRN
  Administered 2023-08-25: 10 mL via INTRADERMAL

## 2023-08-25 MED ORDER — OXYCODONE HCL 5 MG PO TABS: 5.0000 mg | ORAL_TABLET | Freq: Once | ORAL | Status: DC | PRN

## 2023-08-25 MED ORDER — FENTANYL CITRATE PF 50 MCG/ML IJ SOSY: 25.0000 ug | PREFILLED_SYRINGE | INTRAMUSCULAR | Status: DC | PRN

## 2023-08-25 MED ORDER — PHENYLEPHRINE 80 MCG/ML (10ML) SYRINGE FOR IV PUSH (FOR BLOOD PRESSURE SUPPORT)
PREFILLED_SYRINGE | INTRAVENOUS | Status: DC | PRN
  Administered 2023-08-25: 80 ug via INTRAVENOUS
  Administered 2023-08-25: 160 ug via INTRAVENOUS
  Administered 2023-08-25: 80 ug via INTRAVENOUS
  Administered 2023-08-25: 160 ug via INTRAVENOUS

## 2023-08-25 MED ORDER — MIDAZOLAM HCL 2 MG/2ML IJ SOLN
INTRAMUSCULAR | Status: AC
  Filled 2023-08-25: qty 2

## 2023-08-25 MED ORDER — KETOROLAC TROMETHAMINE 30 MG/ML IJ SOLN
INTRAMUSCULAR | Status: DC | PRN
  Administered 2023-08-25: 30 mg via INTRAVENOUS

## 2023-08-25 MED ORDER — PROPOFOL 10 MG/ML IV BOLUS
INTRAVENOUS | Status: AC
  Filled 2023-08-25: qty 20

## 2023-08-25 MED ORDER — ONDANSETRON HCL 4 MG/2ML IJ SOLN
INTRAMUSCULAR | Status: DC | PRN
  Administered 2023-08-25: 4 mg via INTRAVENOUS

## 2023-08-25 MED ORDER — EPHEDRINE SULFATE-NACL 50-0.9 MG/10ML-% IV SOSY
PREFILLED_SYRINGE | INTRAVENOUS | Status: DC | PRN
  Administered 2023-08-25: 5 mg via INTRAVENOUS

## 2023-08-25 MED ORDER — PROPOFOL 500 MG/50ML IV EMUL
INTRAVENOUS | Status: AC
  Filled 2023-08-25: qty 50

## 2023-08-25 MED ORDER — KETOROLAC TROMETHAMINE 30 MG/ML IJ SOLN
INTRAMUSCULAR | Status: AC
  Filled 2023-08-25: qty 1

## 2023-08-25 MED ORDER — 0.9 % SODIUM CHLORIDE (POUR BTL) OPTIME
TOPICAL | Status: DC | PRN
  Administered 2023-08-25: 1000 mL

## 2023-08-25 MED ORDER — CHLORHEXIDINE GLUCONATE CLOTH 2 % EX PADS: 6.0000 | MEDICATED_PAD | Freq: Once | CUTANEOUS | Status: DC

## 2023-08-25 MED ORDER — CEFAZOLIN SODIUM-DEXTROSE 2-4 GM/100ML-% IV SOLN
INTRAVENOUS | Status: AC
  Filled 2023-08-25: qty 100

## 2023-08-25 MED ORDER — PHENYLEPHRINE HCL-NACL 20-0.9 MG/250ML-% IV SOLN
INTRAVENOUS | Status: DC | PRN
  Administered 2023-08-25: 40 ug/min via INTRAVENOUS

## 2023-08-25 MED ORDER — PHENYLEPHRINE 80 MCG/ML (10ML) SYRINGE FOR IV PUSH (FOR BLOOD PRESSURE SUPPORT)
PREFILLED_SYRINGE | INTRAVENOUS | Status: AC
  Filled 2023-08-25: qty 10

## 2023-08-25 MED ORDER — CHLORHEXIDINE GLUCONATE 0.12 % MT SOLN
OROMUCOSAL | Status: AC
  Filled 2023-08-25: qty 15

## 2023-08-25 MED ORDER — ONDANSETRON HCL 4 MG/2ML IJ SOLN: 4.0000 mg | Freq: Once | INTRAMUSCULAR | Status: DC | PRN

## 2023-08-25 MED ORDER — BUPIVACAINE HCL (PF) 0.5 % IJ SOLN
INTRAMUSCULAR | Status: AC
  Filled 2023-08-25: qty 30

## 2023-08-25 MED ORDER — BUPIVACAINE HCL (PF) 0.5 % IJ SOLN
INTRAMUSCULAR | Status: DC | PRN
  Administered 2023-08-25: 7 mL

## 2023-08-25 MED ORDER — CEFAZOLIN SODIUM-DEXTROSE 2-4 GM/100ML-% IV SOLN
2.0000 g | Freq: Once | INTRAVENOUS | Status: AC
  Administered 2023-08-25: 2 g via INTRAVENOUS

## 2023-08-25 MED ORDER — OXYCODONE HCL 5 MG/5ML PO SOLN: 5.0000 mg | Freq: Once | ORAL | Status: DC | PRN

## 2023-08-25 MED ORDER — ONDANSETRON HCL 4 MG/2ML IJ SOLN
INTRAMUSCULAR | Status: AC
  Filled 2023-08-25: qty 2

## 2023-08-25 MED ORDER — CEFAZOLIN SODIUM-DEXTROSE 2-4 GM/100ML-% IV SOLN: 2.0000 g | INTRAVENOUS | Status: DC

## 2023-08-25 MED ORDER — PHENYLEPHRINE HCL-NACL 20-0.9 MG/250ML-% IV SOLN
INTRAVENOUS | Status: AC
  Filled 2023-08-25: qty 250

## 2023-08-25 MED ORDER — LIDOCAINE HCL (PF) 2 % IJ SOLN
INTRAMUSCULAR | Status: AC
  Filled 2023-08-25: qty 5

## 2023-08-25 MED ORDER — MIDAZOLAM HCL 2 MG/2ML IJ SOLN
INTRAMUSCULAR | Status: DC | PRN
  Administered 2023-08-25: 2 mg via INTRAVENOUS

## 2023-08-25 MED ORDER — PROPOFOL 500 MG/50ML IV EMUL
INTRAVENOUS | Status: DC | PRN
  Administered 2023-08-25: 75 ug/kg/min via INTRAVENOUS

## 2023-08-25 MED ORDER — LACTATED RINGERS IV SOLN: INTRAVENOUS | Status: DC | PRN

## 2023-08-25 MED ORDER — LIDOCAINE HCL (PF) 1 % IJ SOLN
INTRAMUSCULAR | Status: AC
  Filled 2023-08-25: qty 30

## 2023-08-25 MED ORDER — FENTANYL CITRATE (PF) 100 MCG/2ML IJ SOLN
INTRAMUSCULAR | Status: AC
  Filled 2023-08-25: qty 2

## 2023-08-25 MED ORDER — LIDOCAINE HCL (PF) 2 % IJ SOLN
INTRAMUSCULAR | Status: DC | PRN
  Administered 2023-08-25: 50 mg via INTRADERMAL

## 2023-08-25 MED ORDER — DEXMEDETOMIDINE HCL IN NACL 80 MCG/20ML IV SOLN
INTRAVENOUS | Status: DC | PRN
  Administered 2023-08-25: 8 ug via INTRAVENOUS

## 2023-08-25 MED ORDER — PROPOFOL 10 MG/ML IV BOLUS
INTRAVENOUS | Status: DC | PRN
  Administered 2023-08-25: 50 mg via INTRAVENOUS
  Administered 2023-08-25: 30 mg via INTRAVENOUS

## 2023-08-25 SURGICAL SUPPLY — 43 items
BANDAGE ESMARK 4X12 BL STRL LF (DISPOSABLE) ×2 IMPLANT
BENZOIN TINCTURE PRP APPL 2/3 (GAUZE/BANDAGES/DRESSINGS) ×2 IMPLANT
BLADE AVERAGE 25X9 (BLADE) ×2 IMPLANT
BLADE OSC/SAG 11.5X5.5X.38 (BLADE) ×2 IMPLANT
BLADE SURG 15 STRL LF DISP TIS (BLADE) ×4 IMPLANT
BNDG CONFORM 2 STRL LF (GAUZE/BANDAGES/DRESSINGS) ×2 IMPLANT
BNDG ELASTIC 4X5.8 VLCR NS LF (GAUZE/BANDAGES/DRESSINGS) ×2 IMPLANT
BNDG ESMARK 4X12 BLUE STRL LF (DISPOSABLE) ×1
BNDG GAUZE DERMACEA FLUFF 4 (GAUZE/BANDAGES/DRESSINGS) IMPLANT
BNDG GAUZE ELAST 4 BULKY (GAUZE/BANDAGES/DRESSINGS) ×2 IMPLANT
BUR SRGRND 54.5X2.4X8 (BURR) IMPLANT
BURR SRGRND 54.5X2.4X8 (BURR) ×1
CHLORAPREP W/TINT 26 (MISCELLANEOUS) ×2 IMPLANT
CLOTH BEACON ORANGE TIMEOUT ST (SAFETY) ×2 IMPLANT
COVER LIGHT HANDLE STERIS (MISCELLANEOUS) ×4 IMPLANT
CUFF TOURN SGL QUICK 18X4 (TOURNIQUET CUFF) ×2 IMPLANT
DECANTER SPIKE VIAL GLASS SM (MISCELLANEOUS) ×4 IMPLANT
DRSG ADAPTIC 3X8 NADH LF (GAUZE/BANDAGES/DRESSINGS) ×2 IMPLANT
ELECT REM PT RETURN 9FT ADLT (ELECTROSURGICAL) ×1
ELECTRODE REM PT RTRN 9FT ADLT (ELECTROSURGICAL) ×2 IMPLANT
GAUZE 4X4 16PLY ~~LOC~~+RFID DBL (SPONGE) ×2 IMPLANT
GAUZE SPONGE 4X4 12PLY STRL (GAUZE/BANDAGES/DRESSINGS) ×2 IMPLANT
GLOVE BIO SURGEON STRL SZ7 (GLOVE) IMPLANT
GLOVE BIOGEL PI IND STRL 7.0 (GLOVE) ×4 IMPLANT
GLOVE BIOGEL PI IND STRL 7.5 (GLOVE) ×2 IMPLANT
GLOVE ECLIPSE 7.0 STRL STRAW (GLOVE) ×2 IMPLANT
GOWN STRL REUS W/ TWL LRG LVL3 (GOWN DISPOSABLE) ×2 IMPLANT
GOWN STRL REUS W/TWL LRG LVL3 (GOWN DISPOSABLE) ×4 IMPLANT
KIT TURNOVER KIT A (KITS) ×2 IMPLANT
MANIFOLD NEPTUNE II (INSTRUMENTS) ×2 IMPLANT
NDL HYPO 25X1 1.5 SAFETY (NEEDLE) ×4 IMPLANT
NEEDLE HYPO 25X1 1.5 SAFETY (NEEDLE) ×2
NS IRRIG 1000ML POUR BTL (IV SOLUTION) ×2 IMPLANT
PACK BASIC LIMB (CUSTOM PROCEDURE TRAY) ×2 IMPLANT
PAD ARMBOARD 7.5X6 YLW CONV (MISCELLANEOUS) ×2 IMPLANT
POSITIONER HEAD 8X9X4 ADT (SOFTGOODS) ×2 IMPLANT
SET BASIN LINEN APH (SET/KITS/TRAYS/PACK) ×2 IMPLANT
SPONGE T-LAP 18X18 ~~LOC~~+RFID (SPONGE) ×2 IMPLANT
STRIP CLOSURE SKIN 1/2X4 (GAUZE/BANDAGES/DRESSINGS) ×4 IMPLANT
SUT ETHILON 4 0 PS 2 18 (SUTURE) IMPLANT
SUT PROLENE 4 0 PS 2 18 (SUTURE) IMPLANT
SUT VIC AB 2-0 CT2 27 (SUTURE) IMPLANT
SYR CONTROL 10ML LL (SYRINGE) ×4 IMPLANT

## 2023-08-25 NOTE — H&P (Signed)
.  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  The various methods of treatment have been discussed with the patient.  No guarantees were given.  After consideration of risks, benefits and other options for treatment, the patient has consented to surgery.  I have reviewed the patients' chart and labs.    Patient Vitals for the past 24 hrs:  BP Temp Temp src Pulse Resp Height Weight  08/25/23 0700 (!) 145/79 97.8 F (36.6 C) Oral 76 16 5\' 2"  (1.575 m) 62.5 kg    A history and physical examination was performed in my office.  The patient was reexamined.  There have been no changes to this history and physical examination.  Erskine Emery, DPM

## 2023-08-25 NOTE — Anesthesia Procedure Notes (Signed)
Date/Time: 08/25/2023 8:24 AM  Performed by: Julian Reil, CRNAPre-anesthesia Checklist: Emergency Drugs available, Suction available, Patient being monitored and Patient identified Patient Re-evaluated:Patient Re-evaluated prior to induction Oxygen Delivery Method: Simple face mask Induction Type: IV induction Placement Confirmation: positive ETCO2

## 2023-08-25 NOTE — Discharge Instructions (Signed)
These instructions will give you an idea of what to expect after surgery and how to manage issues that may arise before your first post op office visit.  Pain Management Pain is best managed by "staying ahead" of it. If pain gets out of control, it is difficult to get it back under control. Local anesthesia that lasts 6-8 hours is used to numb the foot and decrease pain.  For the best pain control, take the pain medication every 4 hours for the first 2 days post op. On the third day pain medication can be taken as needed.   Post Op Nausea Nausea is common after surgery, so it is managed proactively.  If prescribed, use the prescribed nausea medication regularly for the first 2 days post op.  Bandages Do not worry if there is blood on the bandage. What looks like a lot of blood on the bandage is actually a small amount. Blood on the dressing spreads out as it is absorbed by the gauze, the same way a drop of water spreads out on a paper towel.  If the bandages feel wet or dry, stiff and uncomfortable, call the office during office hours and we will schedule a time for you to have the bandage changed.  Unless you are specifically told otherwise, we will do the first bandage change in the office.  Keep your bandage dry. If the bandage becomes wet or soiled, notify the office and we will schedule a time to change the bandage.  Activity It is best to spend most of the first 2 days after surgery lying down with the foot elevated above the level of your heart. You may put weight on your heel while wearing the surgical shoe.   You may only get up to go to the restroom.  Driving Do not drive until you are able to respond in an emergency (i.e. slam on the brakes). This usually occurs after the bone has healed - 6 to 8 weeks.  Call the Office If you have a fever over 101F.  If you have increasing pain after the initial post op pain has settled down.  If you have increasing redness, swelling, or  drainage.  If you have any questions or concerns.   

## 2023-08-25 NOTE — Transfer of Care (Signed)
Immediate Anesthesia Transfer of Care Note  Patient: TASHENNA WELLBAUM  Procedure(s) Performed: TOE ARTHROPLASTY OF RIGHT 4TH TOE AND DEROTATION ARTHROPLASTY OF RIGHT 5TH TOE (Right: Toe) FLEXOR TENOTOMY OF THE RIGHT 4TH TOE (Right: Toe)  Patient Location: PACU  Anesthesia Type:General  Level of Consciousness: awake and alert   Airway & Oxygen Therapy: Patient Spontanous Breathing and Patient connected to face mask oxygen  Post-op Assessment: Report given to RN and Post -op Vital signs reviewed and stable  Post vital signs: Reviewed and stable  Last Vitals:  Vitals Value Taken Time  BP 110/55   Temp 97.5 F   Pulse 68 08/25/23 0943  Resp 14 08/25/23 0943  SpO2 100 % 08/25/23 0943  Vitals shown include unfiled device data.  Last Pain:  Vitals:   08/25/23 0700  TempSrc: Oral  PainSc: 0-No pain         Complications: No notable events documented.

## 2023-08-25 NOTE — Anesthesia Preprocedure Evaluation (Signed)
Anesthesia Evaluation  Patient identified by MRN, date of birth, ID band Patient awake    Reviewed: Allergy & Precautions, H&P , NPO status , Patient's Chart, lab work & pertinent test results, reviewed documented beta blocker date and time   Airway Mallampati: II  TM Distance: >3 FB Neck ROM: full    Dental no notable dental hx.    Pulmonary neg pulmonary ROS   Pulmonary exam normal breath sounds clear to auscultation       Cardiovascular Exercise Tolerance: Good hypertension, +CHF  negative cardio ROS + pacemaker + Valvular Problems/Murmurs  Rhythm:regular Rate:Normal     Neuro/Psych  PSYCHIATRIC DISORDERS Anxiety Depression     Neuromuscular disease CVA negative neurological ROS  negative psych ROS   GI/Hepatic negative GI ROS, Neg liver ROS,GERD  ,,  Endo/Other  negative endocrine ROSdiabetes    Renal/GU Renal diseasenegative Renal ROS  negative genitourinary   Musculoskeletal   Abdominal   Peds  Hematology negative hematology ROS (+) Blood dyscrasia, anemia   Anesthesia Other Findings   Reproductive/Obstetrics negative OB ROS                             Anesthesia Physical Anesthesia Plan  ASA: 3  Anesthesia Plan: General and General ETT   Post-op Pain Management:    Induction:   PONV Risk Score and Plan: Ondansetron  Airway Management Planned:   Additional Equipment:   Intra-op Plan:   Post-operative Plan:   Informed Consent: I have reviewed the patients History and Physical, chart, labs and discussed the procedure including the risks, benefits and alternatives for the proposed anesthesia with the patient or authorized representative who has indicated his/her understanding and acceptance.     Dental Advisory Given  Plan Discussed with: CRNA  Anesthesia Plan Comments:        Anesthesia Quick Evaluation

## 2023-08-25 NOTE — Brief Op Note (Signed)
08/25/2023  9:43 AM  PATIENT:  [name]  72 y.o. female  PRE-OPERATIVE DIAGNOSIS:  HAMMER TOE RIGHT 4TH AND 5TH TOES  POST-OPERATIVE DIAGNOSIS:  HAMMER TOE RIGHT 4TH AND 5TH TOES  PROCEDURE:  Procedure(s): TOE ARTHROPLASTY OF RIGHT 4TH TOE AND DEROTATION ARTHROPLASTY OF RIGHT 5TH TOE (Right) FLEXOR TENOTOMY OF THE RIGHT 4TH TOE (Right)  SURGEON:  Surgeons and Role:    * Erskine Emery, DPM - Primary  PHYSICIAN ASSISTANT:   ASSISTANTS: none   ANESTHESIA:   local, IV sedation, and MAC  EBL:  None.    BLOOD ADMINISTERED:none  DRAINS: none   LOCAL MEDICATIONS USED:  MARCAINE   , LIDOCAINE , and Amount: 10 ml pre and 7 post op marcaine plain  SPECIMEN:  No Specimen  DISPOSITION OF SPECIMEN:  N/A  COUNTS:  YES  TOURNIQUET:   Total Tourniquet Time Documented: Ankle (Right) - 51 minutes Total: Ankle (Right) - 51 minutes   DICTATION: .Reubin Milan Dictation  PLAN OF CARE: Discharge to home after PACU  PATIENT DISPOSITION:  PACU - hemodynamically stable.   Delay start of Pharmacological VTE agent (>24hrs) due to surgical blood loss or risk of bleeding: not applicable

## 2023-08-25 NOTE — Op Note (Signed)
08/25/2023  9:43 AM  PATIENT:  [name]  72 y.o. female  PRE-OPERATIVE DIAGNOSIS:  HAMMER TOE RIGHT 4TH AND 5TH TOES  POST-OPERATIVE DIAGNOSIS:  HAMMER TOE RIGHT 4TH AND 5TH TOES  PROCEDURE:  Procedure(s): TOE ARTHROPLASTY OF RIGHT 4TH TOE AND DEROTATION ARTHROPLASTY OF RIGHT 5TH TOE (Right) FLEXOR TENOTOMY OF THE RIGHT 4TH TOE (Right)  SURGEON:  Surgeons and Role:    * Erskine Emery, DPM - Primary  PHYSICIAN ASSISTANT:   ASSISTANTS: none   ANESTHESIA:   local, IV sedation, and MAC  EBL:  None.    BLOOD ADMINISTERED:none  DRAINS: none   LOCAL MEDICATIONS USED:  MARCAINE   , LIDOCAINE , and Amount: 10 ml pre and 7 post op marcaine plain  MATERIALS: 2-0 Vicryl, 3-0 Prolene, 3-0 Nylon.   TOURNIQUET:   Total Tourniquet Time Documented: Ankle (Right) - 51 minutes Total: Ankle (Right) - 51 minutes  Patient was brought into the operating room laid supine on the operating table. Ankle tourniquet was applied to the right ankle. Following IV sedation, a local block was achieved using 10 cc of mixture of 1% plain lidocaine with 0.5% marcaine. The right foot was the prepped, scrubbed and draped in aseptic manner. Using an esmarch band the tourniquet on the surgical site was inflatted at .    Attention was directed over the right fifth proximal interphalangeal joint. Varus deformity of the right fifth toe noted. HPK noted on the medial aspect of the DIPJ. The skin lesion was debrided. Semi elliptical incision was marked over the PIPJ to correct the varus deformity. Using skin blade incision was made and skin wedge was removed. Care was made to retract all neurovascular bundle. At this time using deep blade, transverse tenotomy of the extenson tendon was performed. The head of the proximal phalanx was freed and using a saw, the head of proximal phalanx was removed. Rasp was used to rasp down any sharp edges. The wound was irrigated and tendon was repaired using 2-0  Vicryl. The skin edges were reapproximated while holding the toe straight using 3-0 Prolene. The toe was sitting in better position than intra-op position.    Attention was then directed towards the right fourth toe. There is HPK lesion noted on the lateral aspect of the right fourth PIPJ. A semielliptical incision was planned over the right fourth PIPJ. Using skin blade incision was made and skin wedge was removed. Care was made to retract all neurovascular bundle. At this time using deep blade, transverse tenotomy of the extenson tendon was performed. The head of the proximal phalanx was freed and using a saw, the head of proximal phalanx was removed. Rasp was used to rasp down any sharp edges. The wound was irrigated and tendon was repaired using 2-0 Vicryl. The skin edges were reapproximated while holding the toe straight using 3-0 Prolene. At this time the toe was still contracted at the DIIPJ when foot was loaded. Decision was made to do percutaneous tenotomy of the right fourth toe. A separate stab incision was made on the plantar aspect of the right fourth toe. The flexor tendon was cut and it was noted the toe was sitting in better position. The skin was closed using 3-0 nylon on the plantar aspect. Dry sterile dressing applied. The tourniquet was deflated. Capillary refill time was brisk. Patient was placed in surgical shoe and transferred to PACU with VSS.   Patient will be partial weightbearing to heel with surgical shoe.

## 2023-08-26 NOTE — Anesthesia Postprocedure Evaluation (Signed)
Anesthesia Post Note  Patient: BAYLOR RANUM  Procedure(s) Performed: TOE ARTHROPLASTY OF RIGHT 4TH TOE AND DEROTATION ARTHROPLASTY OF RIGHT 5TH TOE (Right: Toe) FLEXOR TENOTOMY OF THE RIGHT 4TH TOE (Right: Toe)  Patient location during evaluation: Phase II Anesthesia Type: General Level of consciousness: awake Pain management: pain level controlled Vital Signs Assessment: post-procedure vital signs reviewed and stable Respiratory status: spontaneous breathing and respiratory function stable Cardiovascular status: blood pressure returned to baseline and stable Postop Assessment: no headache and no apparent nausea or vomiting Anesthetic complications: no Comments: Late entry   No notable events documented.   Last Vitals:  Vitals:   08/25/23 1015 08/25/23 1038  BP: 138/65 118/61  Pulse: 71 70  Resp: 13 18  Temp:  36.9 C  SpO2: 99% 98%    Last Pain:  Vitals:   08/26/23 1418  TempSrc:   PainSc: 10-Worst pain ever                 Windell Norfolk

## 2023-08-27 ENCOUNTER — Encounter (HOSPITAL_COMMUNITY): Payer: Self-pay | Admitting: Podiatry

## 2023-08-30 DIAGNOSIS — Z4889 Encounter for other specified surgical aftercare: Secondary | ICD-10-CM | POA: Diagnosis not present

## 2023-09-03 IMAGING — DX DG WRIST COMPLETE 3+V*R*
4 series · 4 of 4 positions shown · non-contrast
Comparison: None

CLINICAL DATA: Fell yesterday landing on RIGHT shoulder, arm, and
wrist, having RIGHT wrist pain

EXAM:
RIGHT WRIST - COMPLETE 3+ VIEW

[wrist pa]
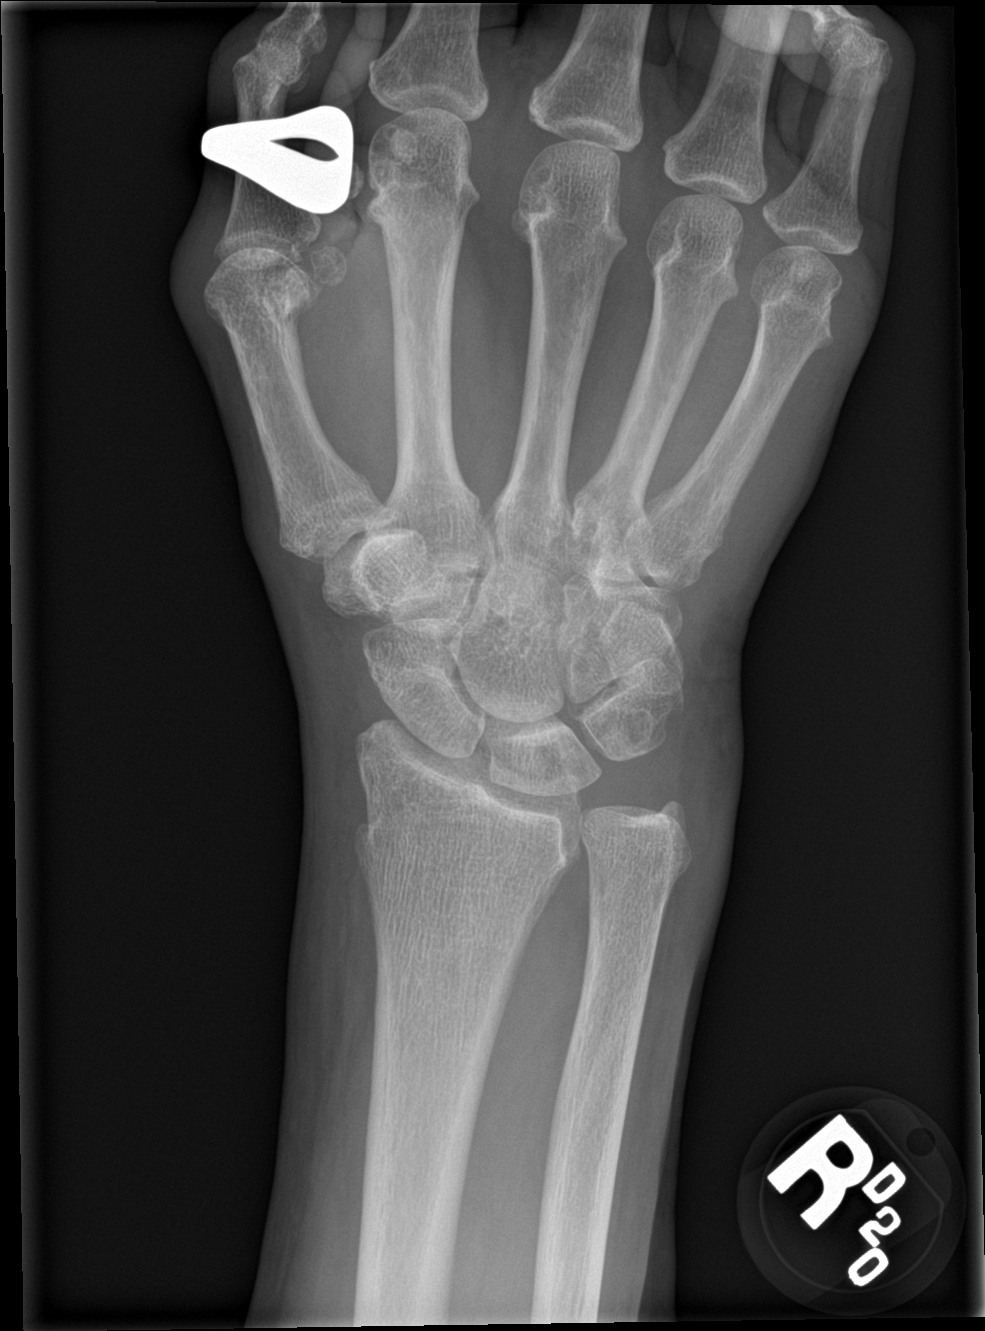

[wrist obl]
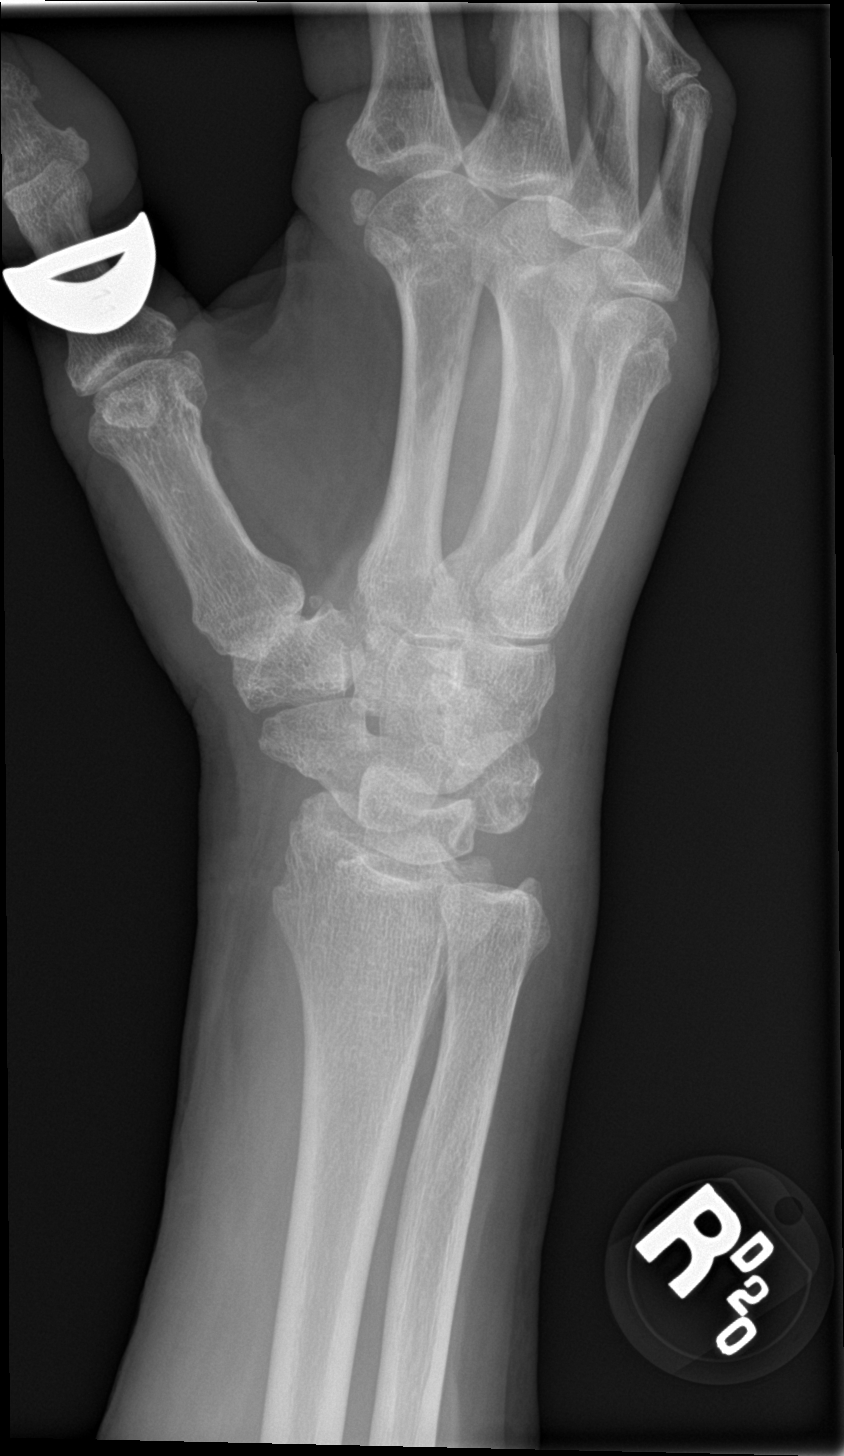

[wrist lat]
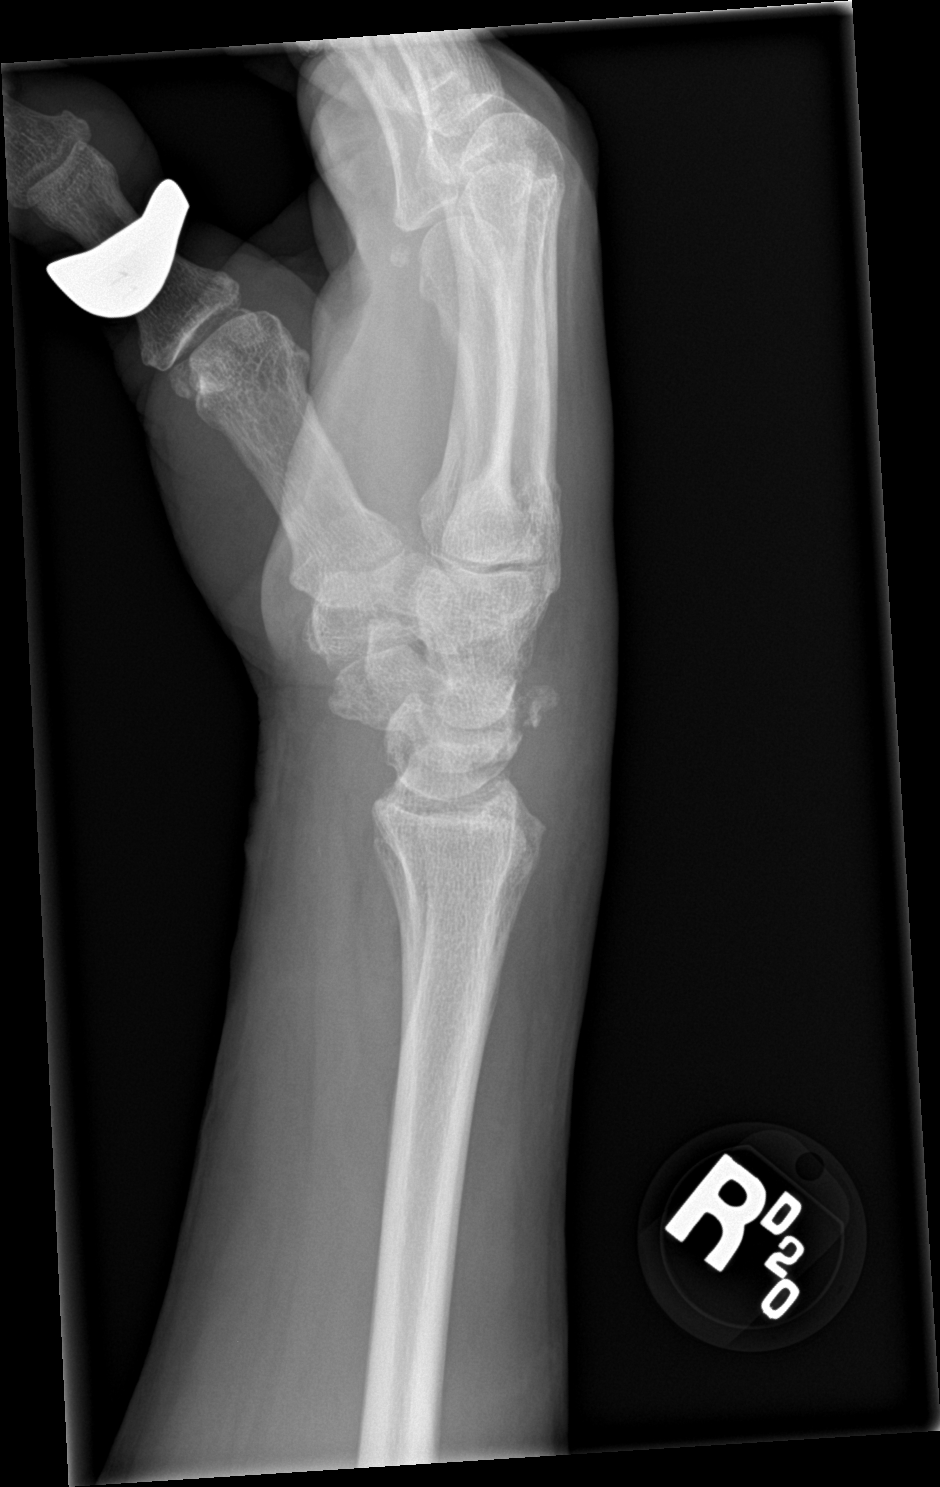

[wrist navicular]
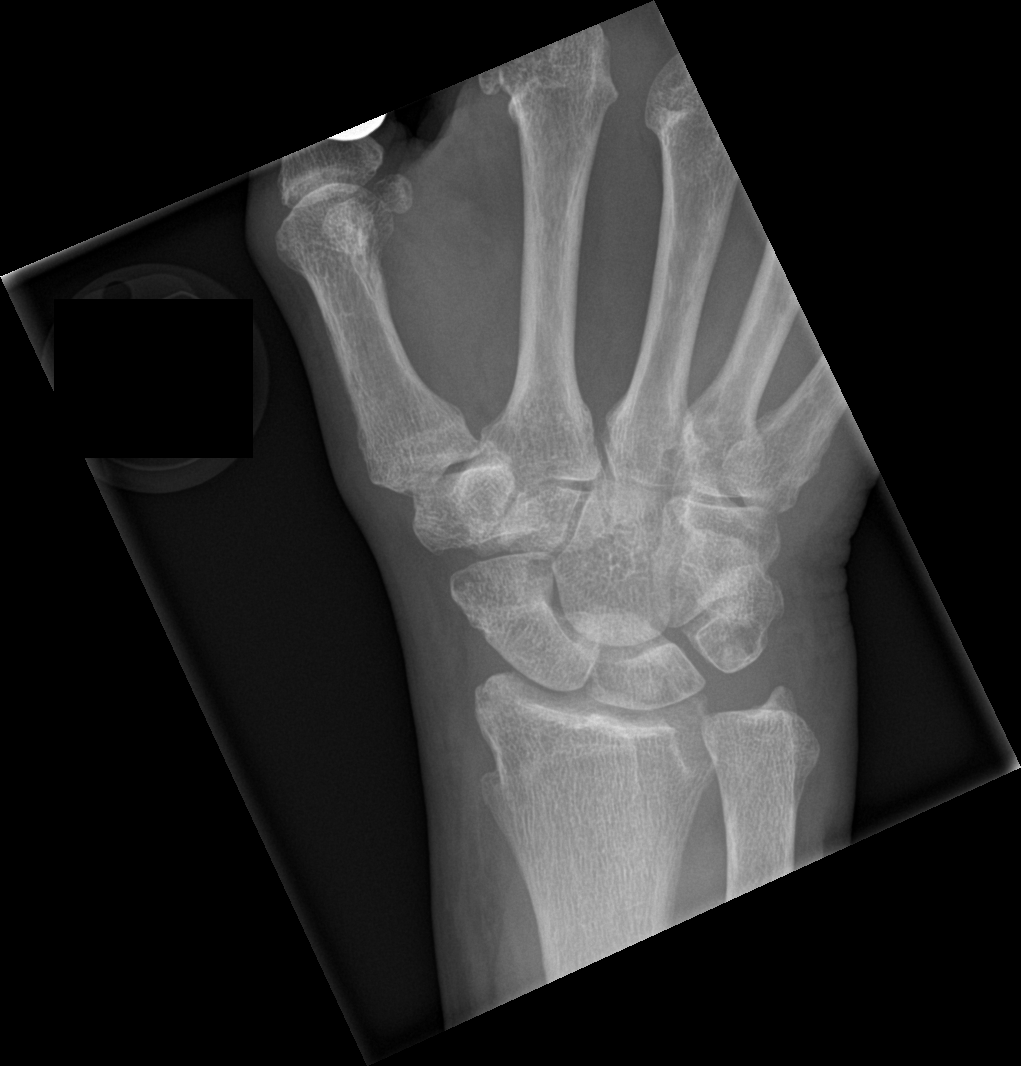

[4 of 4 positions shown; findings below may reference images not displayed]

FINDINGS: Osseous mineralization normal.

Jewelry artifact at thumb.

Joint spaces preserved.

Subchondral cystic change at base of proximal phalanx index finger.

Calcific density identified at the dorsal margin of the carpus,
predominantly corticated, though a portion of the margin adjacent to
the carpus is less well-defined.

This could represent degenerative changes, soft tissue
calcification, or an avulsion fracture arising from the dorsal
margin of the carpus potentially triquetrum.

Overlying soft tissue swelling at dorsum of carpus.

No additional fracture, dislocation, or bone destruction.
IMPRESSION: Questionable fracture fragment versus soft tissue calcification or
calcified body dorsal to the carpus on lateral view; recommend
correlation with tenderness at this site.

## 2023-09-03 IMAGING — DX DG SHOULDER 2+V*R*
3 series · 3 of 3 positions shown · non-contrast
Comparison: None.

CLINICAL DATA: Status post fall, right shoulder pain.

EXAM:
RIGHT SHOULDER - 2+ VIEW

[shoulder grashey]
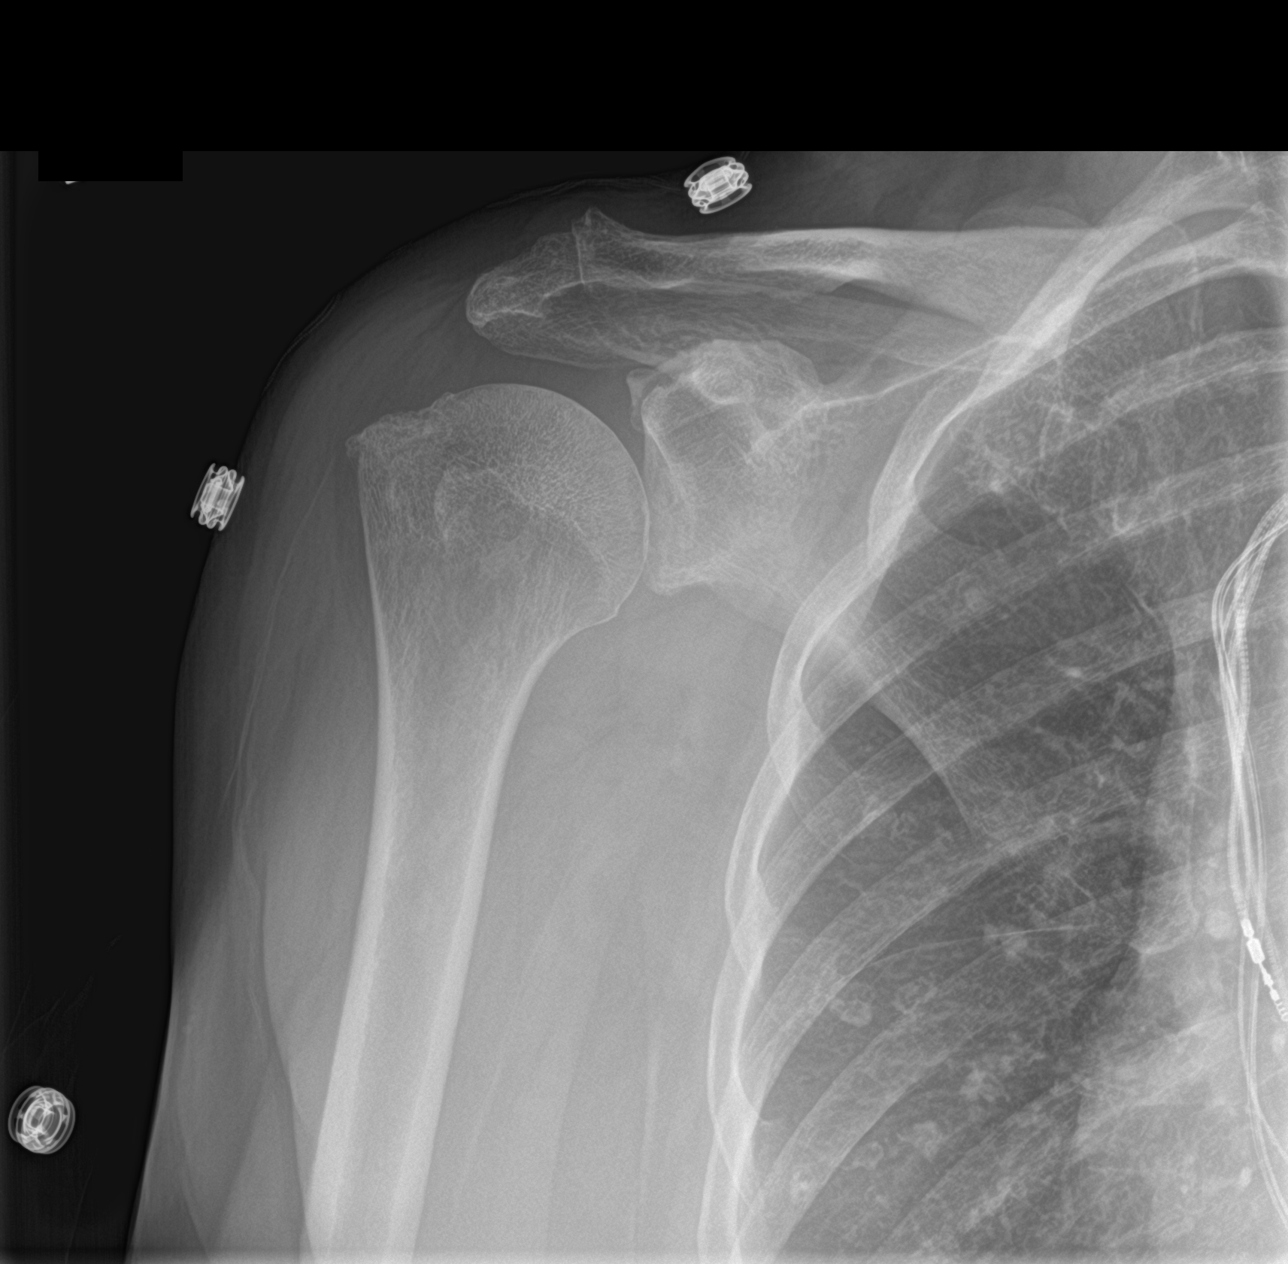

[shoulder y view]
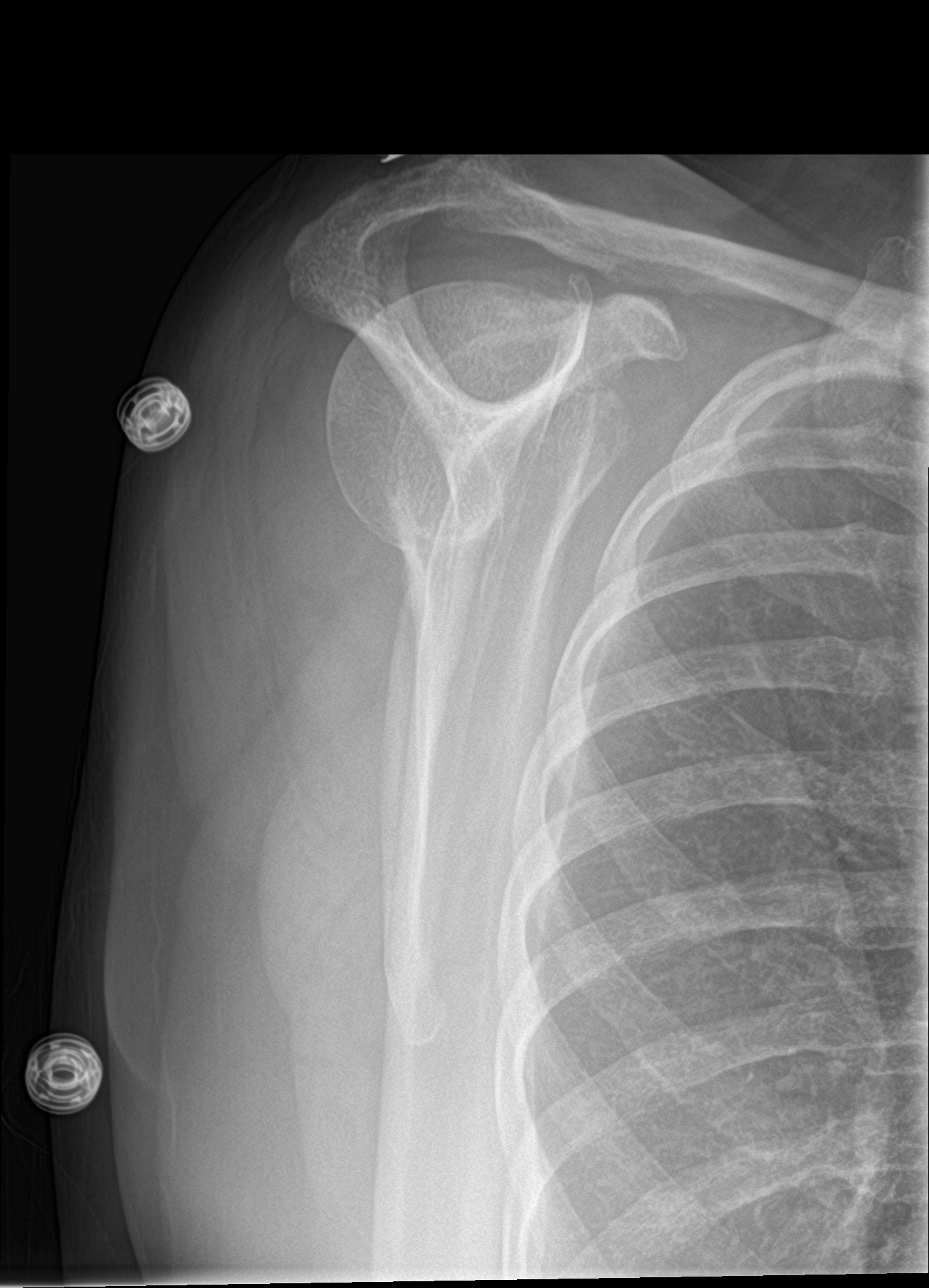

[shoulder axillary]
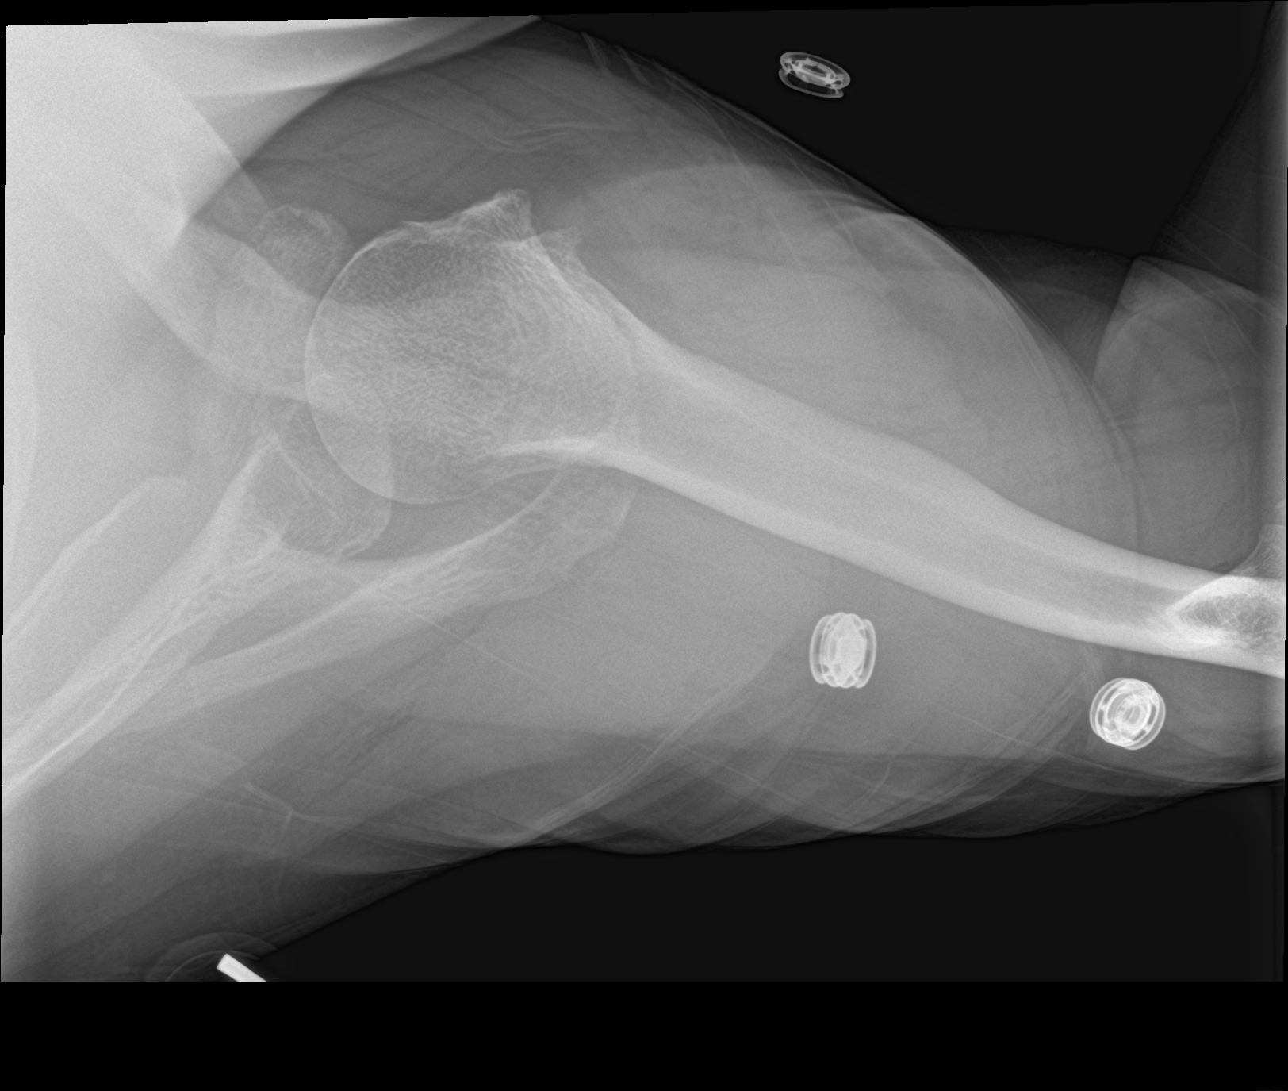

[3 of 3 positions shown; findings below may reference images not displayed]

FINDINGS: No fracture or dislocation. Mild degenerative changes of the
acromioclavicular joint. Soft tissues are normal.
IMPRESSION: 1. No acute osseous injury of the right shoulder.

## 2023-09-03 IMAGING — DX DG RIBS W/ CHEST 3+V*R*
3 series · 3 of 3 positions shown · non-contrast
Comparison: 06/05/2020

CLINICAL DATA: Fall

EXAM:
RIGHT RIBS AND CHEST - 3+ VIEW

[chest pa]
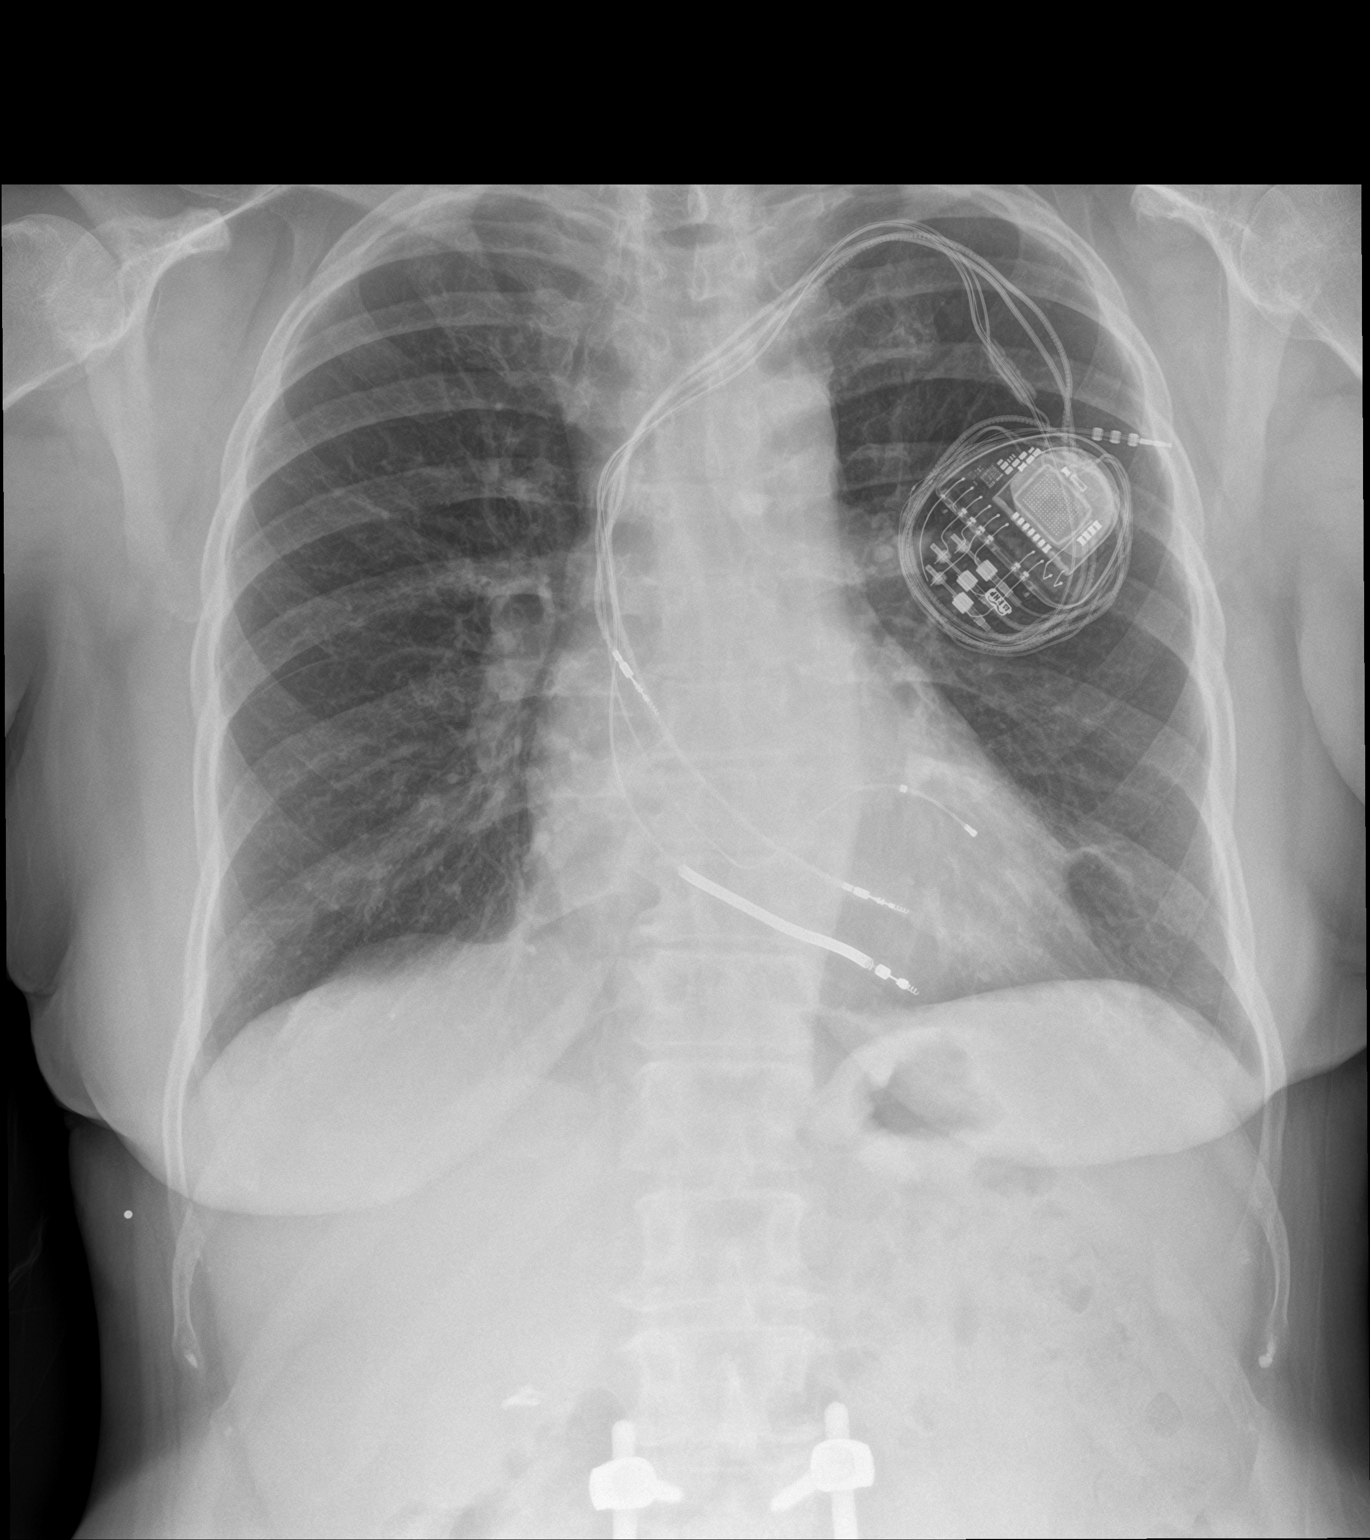

[rib pa (1 of 2)]
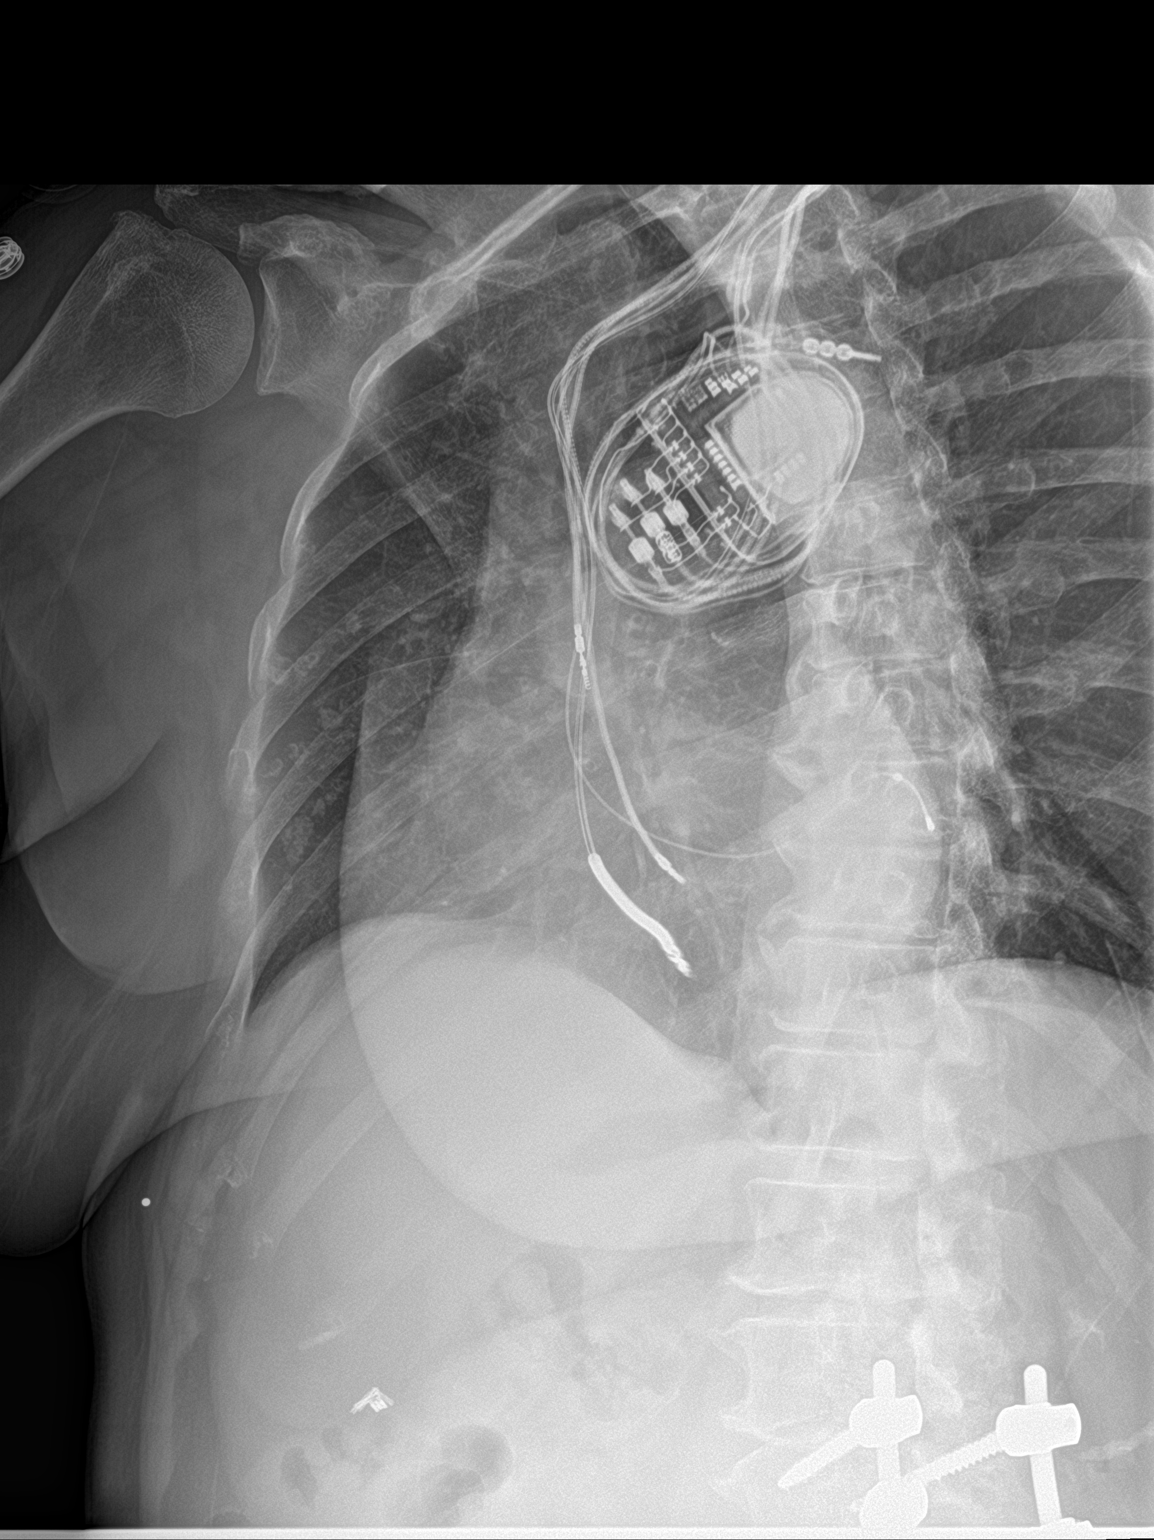

[rib pa (2 of 2)]
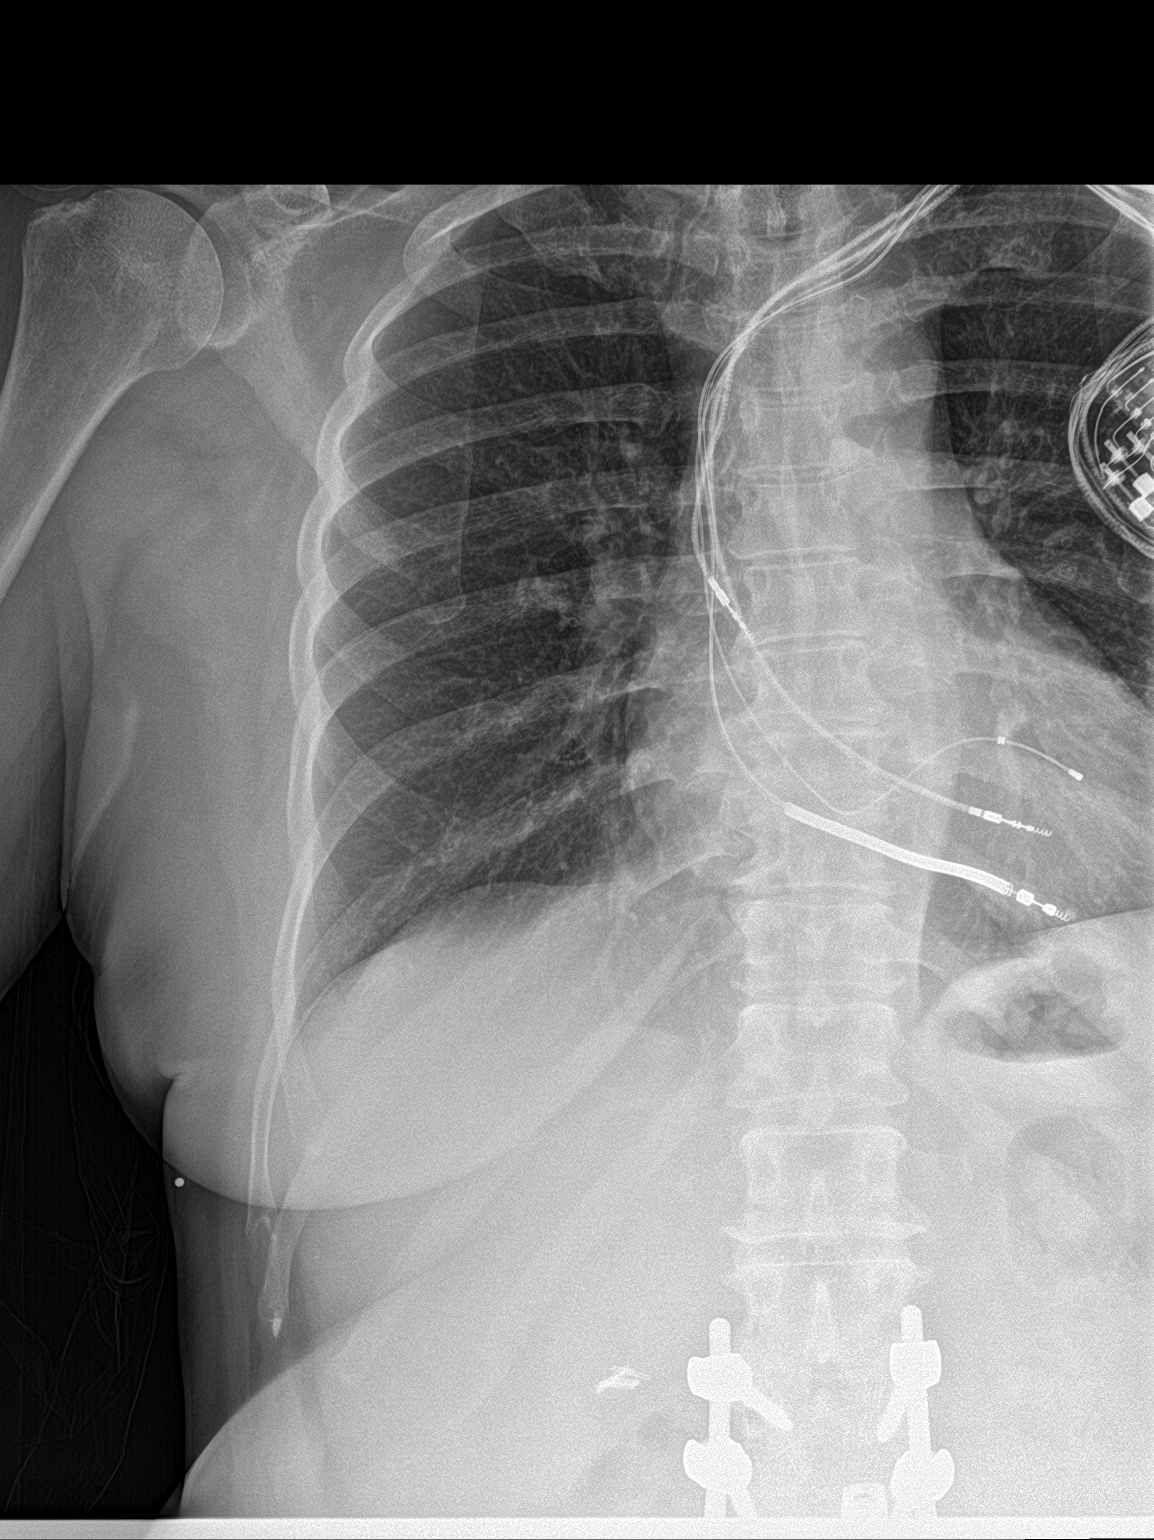

[3 of 3 positions shown; findings below may reference images not displayed]

FINDINGS: No fracture or other bone lesions are seen involving the ribs. There
is no evidence of pneumothorax or pleural effusion. Both lungs are
clear. Heart size and mediastinal contours are within normal limits.
AICD unchanged from the prior study.
IMPRESSION: No active cardiopulmonary disease.  Negative for right rib fracture.

## 2023-09-13 DIAGNOSIS — Z4889 Encounter for other specified surgical aftercare: Secondary | ICD-10-CM | POA: Diagnosis not present

## 2023-09-28 ENCOUNTER — Other Ambulatory Visit (HOSPITAL_COMMUNITY): Payer: Self-pay | Admitting: Internal Medicine

## 2023-09-28 DIAGNOSIS — Z1231 Encounter for screening mammogram for malignant neoplasm of breast: Secondary | ICD-10-CM

## 2023-10-04 DIAGNOSIS — Z4889 Encounter for other specified surgical aftercare: Secondary | ICD-10-CM | POA: Diagnosis not present

## 2023-10-07 DIAGNOSIS — N2581 Secondary hyperparathyroidism of renal origin: Secondary | ICD-10-CM | POA: Diagnosis not present

## 2023-10-07 DIAGNOSIS — E1129 Type 2 diabetes mellitus with other diabetic kidney complication: Secondary | ICD-10-CM | POA: Diagnosis not present

## 2023-10-07 DIAGNOSIS — E139 Other specified diabetes mellitus without complications: Secondary | ICD-10-CM | POA: Diagnosis not present

## 2023-10-07 DIAGNOSIS — R809 Proteinuria, unspecified: Secondary | ICD-10-CM | POA: Diagnosis not present

## 2023-10-07 DIAGNOSIS — E1122 Type 2 diabetes mellitus with diabetic chronic kidney disease: Secondary | ICD-10-CM | POA: Diagnosis not present

## 2023-10-11 DIAGNOSIS — I10 Essential (primary) hypertension: Secondary | ICD-10-CM | POA: Diagnosis not present

## 2023-10-11 DIAGNOSIS — E1165 Type 2 diabetes mellitus with hyperglycemia: Secondary | ICD-10-CM | POA: Diagnosis not present

## 2023-10-14 DIAGNOSIS — E785 Hyperlipidemia, unspecified: Secondary | ICD-10-CM | POA: Diagnosis not present

## 2023-10-14 DIAGNOSIS — E139 Other specified diabetes mellitus without complications: Secondary | ICD-10-CM | POA: Diagnosis not present

## 2023-10-14 DIAGNOSIS — I10 Essential (primary) hypertension: Secondary | ICD-10-CM | POA: Diagnosis not present

## 2023-10-18 DIAGNOSIS — S9031XA Contusion of right foot, initial encounter: Secondary | ICD-10-CM | POA: Diagnosis not present

## 2023-10-18 DIAGNOSIS — Z4889 Encounter for other specified surgical aftercare: Secondary | ICD-10-CM | POA: Diagnosis not present

## 2023-10-20 DIAGNOSIS — I110 Hypertensive heart disease with heart failure: Secondary | ICD-10-CM | POA: Diagnosis not present

## 2023-10-20 DIAGNOSIS — M545 Low back pain, unspecified: Secondary | ICD-10-CM | POA: Diagnosis not present

## 2023-10-20 DIAGNOSIS — N390 Urinary tract infection, site not specified: Secondary | ICD-10-CM | POA: Diagnosis not present

## 2023-10-20 DIAGNOSIS — K219 Gastro-esophageal reflux disease without esophagitis: Secondary | ICD-10-CM | POA: Diagnosis not present

## 2023-10-20 DIAGNOSIS — E782 Mixed hyperlipidemia: Secondary | ICD-10-CM | POA: Diagnosis not present

## 2023-10-20 DIAGNOSIS — D649 Anemia, unspecified: Secondary | ICD-10-CM | POA: Diagnosis not present

## 2023-10-20 DIAGNOSIS — I4891 Unspecified atrial fibrillation: Secondary | ICD-10-CM | POA: Diagnosis not present

## 2023-10-20 DIAGNOSIS — N189 Chronic kidney disease, unspecified: Secondary | ICD-10-CM | POA: Diagnosis not present

## 2023-10-20 DIAGNOSIS — E1122 Type 2 diabetes mellitus with diabetic chronic kidney disease: Secondary | ICD-10-CM | POA: Diagnosis not present

## 2023-10-20 DIAGNOSIS — F411 Generalized anxiety disorder: Secondary | ICD-10-CM | POA: Diagnosis not present

## 2023-10-20 DIAGNOSIS — I5032 Chronic diastolic (congestive) heart failure: Secondary | ICD-10-CM | POA: Diagnosis not present

## 2023-10-20 DIAGNOSIS — I10 Essential (primary) hypertension: Secondary | ICD-10-CM | POA: Diagnosis not present

## 2023-11-01 ENCOUNTER — Ambulatory Visit (HOSPITAL_COMMUNITY)
Admission: RE | Admit: 2023-11-01 | Discharge: 2023-11-01 | Disposition: A | Discharge status: Home / Self Care | Payer: Medicare Other | Source: Ambulatory Visit | Attending: Internal Medicine | Admitting: Internal Medicine

## 2023-11-01 ENCOUNTER — Encounter (HOSPITAL_COMMUNITY): Payer: Self-pay

## 2023-11-01 DIAGNOSIS — Z1231 Encounter for screening mammogram for malignant neoplasm of breast: Secondary | ICD-10-CM | POA: Diagnosis not present

## 2023-11-15 DIAGNOSIS — Z4889 Encounter for other specified surgical aftercare: Secondary | ICD-10-CM | POA: Diagnosis not present

## 2023-11-19 ENCOUNTER — Encounter (INDEPENDENT_AMBULATORY_CARE_PROVIDER_SITE_OTHER): Payer: Medicare Other

## 2023-11-19 DIAGNOSIS — I255 Ischemic cardiomyopathy: Secondary | ICD-10-CM

## 2023-11-26 DIAGNOSIS — Z961 Presence of intraocular lens: Secondary | ICD-10-CM | POA: Diagnosis not present

## 2023-11-26 DIAGNOSIS — E119 Type 2 diabetes mellitus without complications: Secondary | ICD-10-CM | POA: Diagnosis not present

## 2023-11-26 DIAGNOSIS — H35033 Hypertensive retinopathy, bilateral: Secondary | ICD-10-CM | POA: Diagnosis not present

## 2023-11-26 DIAGNOSIS — Z6824 Body mass index (BMI) 24.0-24.9, adult: Secondary | ICD-10-CM | POA: Diagnosis not present

## 2023-11-26 DIAGNOSIS — H43813 Vitreous degeneration, bilateral: Secondary | ICD-10-CM | POA: Diagnosis not present

## 2023-11-26 DIAGNOSIS — L0390 Cellulitis, unspecified: Secondary | ICD-10-CM | POA: Diagnosis not present

## 2023-11-26 DIAGNOSIS — H353132 Nonexudative age-related macular degeneration, bilateral, intermediate dry stage: Secondary | ICD-10-CM | POA: Diagnosis not present

## 2023-11-26 DIAGNOSIS — T148XXA Other injury of unspecified body region, initial encounter: Secondary | ICD-10-CM | POA: Diagnosis not present

## 2023-12-21 NOTE — Progress Notes (Signed)
 Remote pacemaker transmission.

## 2024-01-04 DIAGNOSIS — N189 Chronic kidney disease, unspecified: Secondary | ICD-10-CM | POA: Diagnosis not present

## 2024-01-04 DIAGNOSIS — E211 Secondary hyperparathyroidism, not elsewhere classified: Secondary | ICD-10-CM | POA: Diagnosis not present

## 2024-01-04 DIAGNOSIS — D649 Anemia, unspecified: Secondary | ICD-10-CM | POA: Diagnosis not present

## 2024-01-04 DIAGNOSIS — D631 Anemia in chronic kidney disease: Secondary | ICD-10-CM | POA: Diagnosis not present

## 2024-01-04 DIAGNOSIS — R809 Proteinuria, unspecified: Secondary | ICD-10-CM | POA: Diagnosis not present

## 2024-01-12 DIAGNOSIS — N312 Flaccid neuropathic bladder, not elsewhere classified: Secondary | ICD-10-CM | POA: Diagnosis not present

## 2024-01-12 DIAGNOSIS — E119 Type 2 diabetes mellitus without complications: Secondary | ICD-10-CM | POA: Diagnosis not present

## 2024-01-12 DIAGNOSIS — R32 Unspecified urinary incontinence: Secondary | ICD-10-CM | POA: Diagnosis not present

## 2024-01-12 DIAGNOSIS — R339 Retention of urine, unspecified: Secondary | ICD-10-CM | POA: Diagnosis not present

## 2024-01-13 DIAGNOSIS — N1832 Chronic kidney disease, stage 3b: Secondary | ICD-10-CM | POA: Diagnosis not present

## 2024-01-13 DIAGNOSIS — I5042 Chronic combined systolic (congestive) and diastolic (congestive) heart failure: Secondary | ICD-10-CM | POA: Diagnosis not present

## 2024-01-13 DIAGNOSIS — D509 Iron deficiency anemia, unspecified: Secondary | ICD-10-CM | POA: Diagnosis not present

## 2024-01-13 DIAGNOSIS — E1129 Type 2 diabetes mellitus with other diabetic kidney complication: Secondary | ICD-10-CM | POA: Diagnosis not present

## 2024-01-17 DIAGNOSIS — N390 Urinary tract infection, site not specified: Secondary | ICD-10-CM | POA: Diagnosis not present

## 2024-01-17 DIAGNOSIS — F411 Generalized anxiety disorder: Secondary | ICD-10-CM | POA: Diagnosis not present

## 2024-01-17 DIAGNOSIS — F5101 Primary insomnia: Secondary | ICD-10-CM | POA: Diagnosis not present

## 2024-01-17 DIAGNOSIS — M545 Low back pain, unspecified: Secondary | ICD-10-CM | POA: Diagnosis not present

## 2024-01-17 DIAGNOSIS — M79673 Pain in unspecified foot: Secondary | ICD-10-CM | POA: Diagnosis not present

## 2024-01-17 DIAGNOSIS — T300 Burn of unspecified body region, unspecified degree: Secondary | ICD-10-CM | POA: Diagnosis not present

## 2024-01-17 DIAGNOSIS — G8929 Other chronic pain: Secondary | ICD-10-CM | POA: Diagnosis not present

## 2024-02-18 ENCOUNTER — Encounter (INDEPENDENT_AMBULATORY_CARE_PROVIDER_SITE_OTHER): Payer: Medicare Other

## 2024-02-18 DIAGNOSIS — I255 Ischemic cardiomyopathy: Secondary | ICD-10-CM | POA: Diagnosis not present

## 2024-02-19 LAB — CUP PACEART REMOTE DEVICE CHECK
Battery Remaining Longevity: 47 mo
Battery Voltage: 2.95 V
Brady Statistic AP VP Percent: 0.04 %
Brady Statistic AP VS Percent: 0.02 %
Brady Statistic AS VP Percent: 98.45 %
Brady Statistic AS VS Percent: 1.49 %
Brady Statistic RA Percent Paced: 0.07 %
Brady Statistic RV Percent Paced: 7.17 %
Date Time Interrogation Session: 20250605233754
Implantable Lead Connection Status: 753985
Implantable Lead Connection Status: 753985
Implantable Lead Connection Status: 753985
Implantable Lead Implant Date: 20060214
Implantable Lead Implant Date: 20150406
Implantable Lead Implant Date: 20150406
Implantable Lead Location: 753858
Implantable Lead Location: 753859
Implantable Lead Location: 753860
Implantable Lead Model: 4396
Implantable Lead Model: 5076
Implantable Pulse Generator Implant Date: 20210610
Lead Channel Impedance Value: 1330 Ohm
Lead Channel Impedance Value: 1444 Ohm
Lead Channel Impedance Value: 342 Ohm
Lead Channel Impedance Value: 475 Ohm
Lead Channel Impedance Value: 513 Ohm
Lead Channel Impedance Value: 589 Ohm
Lead Channel Impedance Value: 646 Ohm
Lead Channel Impedance Value: 893 Ohm
Lead Channel Impedance Value: 931 Ohm
Lead Channel Pacing Threshold Amplitude: 1.25 V
Lead Channel Pacing Threshold Amplitude: 1.375 V
Lead Channel Pacing Threshold Amplitude: 2.375 V
Lead Channel Pacing Threshold Pulse Width: 0.4 ms
Lead Channel Pacing Threshold Pulse Width: 0.4 ms
Lead Channel Pacing Threshold Pulse Width: 0.8 ms
Lead Channel Sensing Intrinsic Amplitude: 2.125 mV
Lead Channel Sensing Intrinsic Amplitude: 2.125 mV
Lead Channel Sensing Intrinsic Amplitude: 5.25 mV
Lead Channel Sensing Intrinsic Amplitude: 5.25 mV
Lead Channel Setting Pacing Amplitude: 2.5 V
Lead Channel Setting Pacing Amplitude: 3 V
Lead Channel Setting Pacing Amplitude: 3.25 V
Lead Channel Setting Pacing Pulse Width: 0.4 ms
Lead Channel Setting Pacing Pulse Width: 0.8 ms
Lead Channel Setting Sensing Sensitivity: 0.9 mV
Zone Setting Status: 755011

## 2024-02-20 ENCOUNTER — Ambulatory Visit: Payer: Self-pay | Admitting: Internal Medicine

## 2024-04-04 NOTE — Progress Notes (Signed)
 Remote pacemaker transmission.

## 2024-04-04 NOTE — Addendum Note (Signed)
 Addended by: VICCI SELLER A on: 04/04/2024 11:07 AM   Modules accepted: Orders

## 2024-04-25 ENCOUNTER — Other Ambulatory Visit: Payer: Self-pay | Admitting: Podiatry

## 2024-05-02 ENCOUNTER — Ambulatory Visit
Admission: EM | Admit: 2024-05-02 | Discharge: 2024-05-02 | Disposition: A | Discharge status: Home / Self Care | Attending: Nurse Practitioner | Admitting: Nurse Practitioner

## 2024-05-02 DIAGNOSIS — K0889 Other specified disorders of teeth and supporting structures: Secondary | ICD-10-CM

## 2024-05-02 MED ORDER — AMOXICILLIN-POT CLAVULANATE 875-125 MG PO TABS: 1.0000 | ORAL_TABLET | Freq: Two times a day (BID) | ORAL | 0 refills | Status: DC

## 2024-05-02 NOTE — ED Provider Notes (Signed)
 [facility]    CSN: [id number] Arrival date & time: 05/02/24  1503      History   Chief Complaint No chief complaint on file.   HPI NANDIKA STETZER is a 73 y.o. female.   The history is provided by the patient.   Patient presents with a 2-day history of left upper dental pain.  Patient states that she did, talk to her dentist, but they told her she needed to come to urgent care because they did not have equipment to clean their tolls.  Patient states that the pain is sharp and shooting.  She denies injury, trauma, facial swelling, or gingival swelling.  So far, she states she has been taking Vicodin 10/325 mg with no relief of her symptoms.  States that she does have an appointment for next month with her dentist.  Past Medical History:  Diagnosis Date   Anxiety    Arthritis    Cardiomyopathy    Cervical high risk HPV (human papillomavirus) test positive 01/30/2015   CHF (congestive heart failure) (HCC)    Chronic alcohol abuse 06/09/2020   Chronic constipation    Chronic kidney disease    Chronic lower back pain    Constipation 01/30/2015   Depression    GERD (gastroesophageal reflux disease)    Heart failure    Chronic combined   Heart murmur    HTN (hypertension)    Hypercholesteremia    Neurogenic bladder    2008 nerve damage s/p back ssurgery   Presence of permanent cardiac pacemaker    Type II diabetes mellitus (HCC)     Patient Active Problem List   Diagnosis Date Noted   Iron  deficiency anemia 11/19/2022   Hypotension 06/25/2022   Dehydration 06/25/2022   Transaminitis 06/25/2022   Hypoalbuminemia due to protein-calorie malnutrition (HCC) 06/25/2022   Type 2 diabetes mellitus (HCC) 06/25/2022   Status post left hip replacement 06/20/2022   Unilateral primary osteoarthritis, left hip 06/18/2022   Status post total replacement of left hip 06/18/2022   Visual hallucination 02/25/2022   Delirium 02/25/2022   Metabolic acidosis 02/24/2022    Hypophosphatemia 02/22/2022   Hypomagnesemia 02/22/2022   Acute blood loss anemia superimposed on anemia of chronic disease 02/20/2022   Acute toxic and metabolic encephalopathy 02/20/2022   Thrombocytopenia (HCC) 02/20/2022   Hypokalemia 02/20/2022   Rhabdomyolysis 02/20/2022   Lactic acidosis 02/20/2022   UTI (urinary tract infection) 02/20/2022   Clostridioides difficile infection 02/18/2022   Facial trauma due to fall at home 02/17/2022   Hypotension 02/17/2022   Acute kidney injury superimposed on chronic kidney disease (HCC) 02/17/2022   Hypovolemic shock (HCC)    Cardiomyopathy (HCC) 11/14/2020   Diabetic nephropathy (HCC) 11/14/2020   Impaired mobility and activities of daily living 06/10/2020   Alcohol withdrawal syndrome (HCC) 06/09/2020   Facet arthropathy, lumbar 06/09/2020   Sepsis (HCC) 06/09/2020   Sinus tachycardia by electrocardiogram 06/09/2020   Spondylolisthesis of lumbosacral region 06/09/2020   Altered mental status 03/12/2020   Stage 3b chronic kidney disease (HCC) 10/24/2019   Urine leukocytes 09/24/2019   Other microscopic hematuria 09/24/2019   Postmenopause 02/08/2018   Screening for colorectal cancer 02/08/2018   Well woman exam with routine gynecological exam 02/08/2018   Anemia in chronic kidney disease 12/15/2016   Uncontrolled hypertension 09/12/2015   Constipation 01/30/2015   Cervical high risk HPV (human papillomavirus) test positive 01/30/2015   Biventricular ICD (implantable cardioverter-defibrillator) in place 04/19/2014   Chronic systolic heart failure (HCC) 12/18/2013  Preop cardiovascular exam 11/17/2011   Colon cancer screening 02/04/2011   Uncontrolled IDDM-2 with hyperglycemia and CKD-3B 12/31/2009   Mixed hyperlipidemia 12/31/2009   ANXIETY DEPRESSION 12/31/2009   GASTROESOPHAGEAL REFLUX DISEASE, CHRONIC 12/31/2009   Chronic lower back pain 12/31/2009   Cerebral vascular accident (HCC) 05/22/2009   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   HYPERTENSION, HEART CONTROLLED W/O ASSOC CHF 11/27/2008   Chronic diastolic CHF (congestive heart failure) (HCC) 11/27/2008   PACEMAKER, PERMANENT 11/27/2008    Past Surgical History:  Procedure Laterality Date   ACHILLES TENDON SURGERY Right 06/14/2012   BACK SURGERY  09/15/2003   BI-VENTRICULAR IMPLANTABLE CARDIOVERTER DEFIBRILLATOR  (CRT-D)  12/18/2013   upgrade of previously implanted dual chamber pacemaker to MDT CRTD with RV lead revision by Dr Waddell DUSTMAN PACEMAKER UPGRADE  12/18/2013   w/defibrillator   BIOPSY  04/13/2023   Procedure: BIOPSY;  Surgeon: Eartha Angelia Sieving, MD;  Location: [facility];  Service: Gastroenterology;;   BIV PACEMAKER GENERATOR CHANGE OUT N/A 12/18/2013   Procedure: BIV PACEMAKER GENERATOR CHANGE OUT;  Surgeon: Danelle LELON Waddell, MD;  Location: [facility] CATH LAB;  Service: Cardiovascular;  Laterality: N/A;   BIV PACEMAKER INSERTION CRT-P N/A 02/22/2020   Procedure: DOWNGRADE BIV PACEMAKER INSERTION CRT-P;  Surgeon: Waddell Danelle LELON, MD;  Location: [facility] INVASIVE CV LAB;  Service: Cardiovascular;  Laterality: N/A;   BREAST BIOPSY Left 09/15/1971   benign   BREAST EXCISIONAL BIOPSY     age 78 benign   CATARACT EXTRACTION Bilateral    CESAREAN SECTION  09/15/1979   CHOLECYSTECTOMY     COLONOSCOPY WITH PROPOFOL  N/A 04/13/2023   Procedure: COLONOSCOPY WITH PROPOFOL ;  Surgeon: Eartha Angelia Sieving, MD;  Location: [facility];  Service: Gastroenterology;  Laterality: N/A;  1:30PM;ASA3   ESOPHAGOGASTRODUODENOSCOPY (EGD) WITH PROPOFOL  N/A 04/13/2023   Procedure: ESOPHAGOGASTRODUODENOSCOPY (EGD) WITH PROPOFOL ;  Surgeon: Eartha Angelia Sieving, MD;  Location: [facility];  Service: Gastroenterology;  Laterality: N/A;  1:30PM;ASA3   FLEXOR TENOTOMY  Right 08/25/2023   Procedure: FLEXOR TENOTOMY OF THE RIGHT 4TH TOE;  Surgeon: Tobie Buckles, DPM;  Location: [facility];  Service: Podiatry;  Laterality:  Right;   GASTROCNEMIUS RECESSION  06/24/2012   Procedure: GASTROCNEMIUS SLIDE;  Surgeon: Deward DELENA Schwartz, MD;  Location: [facility];  Service: Orthopedics;  Laterality: Right;   HERNIA REPAIR     LACERATION REPAIR Left 02/17/2022   Procedure: REPAIR LEFT CHEEK COMPLEX LACERATION WITH MYRIAD PLACEMENT;  Surgeon: Lowery Estefana RAMAN, DO;  Location: [facility];  Service: Plastics;  Laterality: Left;   LEAD REVISION N/A 12/18/2013   Procedure: LEAD REVISION;  Surgeon: Danelle LELON Waddell, MD;  Location: [facility] CATH LAB;  Service: Cardiovascular;  Laterality: N/A;   PACEMAKER INSERTION  09/14/2004   Dual chamber MDT pacemaker implanted - subsequent upgrade to CRTD 12-2013   POLYPECTOMY  04/13/2023   Procedure: POLYPECTOMY;  Surgeon: Eartha Angelia Sieving, MD;  Location: [facility];  Service: Gastroenterology;;   POSTERIOR LUMBAR FUSION  2006; 2008   SIGMOIDOSCOPY  MAY 2011 w/ PROP   iTCS-->FSIG-poor bowel prep   SKIN FULL THICKNESS GRAFT Left 04/06/2022   Procedure: SKIN GRAFT FULL THICKNESS;  Surgeon: Lowery Estefana RAMAN, DO;  Location: [facility];  Service: Plastics;  Laterality: Left;   TOE ARTHROPLASTY Right 08/25/2023   Procedure: TOE ARTHROPLASTY OF RIGHT 4TH TOE AND DEROTATION ARTHROPLASTY OF RIGHT 5TH TOE;  Surgeon: Tobie Buckles, DPM;  Location: [facility];  Service: Podiatry;  Laterality:  Right;   TOTAL HIP ARTHROPLASTY Left 06/18/2022   Procedure: LEFT TOTAL HIP ARTHROPLASTY ANTERIOR APPROACH;  Surgeon: Vernetta Lonni GRADE, MD;  Location: [facility];  Service: Orthopedics;  Laterality: Left;   TUBAL LIGATION  ~ 1984   UPPER GASTROINTESTINAL ENDOSCOPY  MAY 2011 w/ PROP   MILD GASTRITIS 2o ETOH   WISDOM TOOTH EXTRACTION      OB History     Gravida  1   Para  1   Term      Preterm      AB      Living         SAB      IAB      Ectopic      Multiple      Live Births  1            Home Medications    Prior to Admission medications   Medication Sig  Start Date End Date Taking? Authorizing Provider  aspirin  81 MG chewable tablet Chew 1 tablet (81 mg total) by mouth 2 (two) times daily. Patient taking differently: Chew 81 mg by mouth daily. 06/20/22   Vernetta Lonni GRADE, MD  buPROPion  (WELLBUTRIN  XL) 300 MG 24 hr tablet Take 300 mg by mouth daily. 06/19/20   [provider]  calcitRIOL (ROCALTROL) 0.25 MCG capsule Take 0.25 mcg by mouth See admin instructions. Sunday, Wednesday and Friday    [provider]  carvedilol  (COREG ) 12.5 MG tablet Take 12.5 mg by mouth 2 (two) times daily. 11/16/22   [provider]  clonazePAM  (KLONOPIN ) 1 MG tablet Take 1 mg by mouth daily as needed for anxiety.    [provider]  furosemide  (LASIX ) 40 MG tablet Take 40 mg by mouth every other day.    [provider]  gabapentin  (NEURONTIN ) 600 MG tablet Take 600 mg by mouth at bedtime.    [provider]  HYDROcodone -acetaminophen  (NORCO) 10-325 MG tablet Take 1 tablet by mouth in the morning, at noon, and at bedtime.    [provider]  insulin  glargine (LANTUS ) 100 UNIT/ML injection Inject 7 Units into the skin every morning.    [provider]  insulin  lispro (HUMALOG  KWIKPEN) 100 UNIT/ML KwikPen Inject 5 Units into the skin 3 (three) times daily with meals. Per sliding scale    [provider]  losartan  (COZAAR ) 25 MG tablet Take 25 mg by mouth daily.    [provider]  Multiple Vitamins-Minerals (PRESERVISION AREDS PO) Take 1 capsule by mouth in the morning and at bedtime.    [provider]  omeprazole (PRILOSEC) 20 MG capsule Take 20 mg by mouth daily.    [provider]  OZEMPIC, 0.25 OR 0.5 MG/DOSE, 2 MG/3ML SOPN Inject 0.5 mg into the skin every Wednesday. 02/03/23   [provider]  potassium chloride  SA (KLOR-CON  M) 20 MEQ tablet Take 20 mEq by mouth daily. 06/19/20   [provider]  Propylene Glycol (SYSTANE BALANCE) 0.6 % SOLN  Place 1 drop into both eyes in the morning and at bedtime.    [provider]  rosuvastatin  (CRESTOR ) 40 MG tablet TAKE ONE (1) TABLET BY MOUTH EVERY DAY (STOP ATORVASTATIN ) Patient taking differently: Take 40 mg by mouth daily. 01/29/20   Corum, Olam CROME, MD  sertraline  (ZOLOFT ) 100 MG tablet Take 200 mg by mouth daily.    [provider]  tiZANidine  (ZANAFLEX ) 4 MG capsule Take 4 mg by mouth at bedtime.  [provider]  zolpidem  (AMBIEN ) 10 MG tablet Take 15 mg by mouth at bedtime. 12/04/21   [provider]    Family History Family History  Problem Relation Age of Onset   Alzheimer's disease Mother    Diabetes Mother    Cancer Father    Cancer Sister        breast   Diabetes Sister    Diabetes Brother    Brain cancer Brother    Diabetes Brother    Colon cancer Neg Hx    Colon polyps Neg Hx     Social History Social History   Tobacco Use   Smoking status: Never   Smokeless tobacco: Never  Vaping Use   Vaping status: Never Used  Substance Use Topics   Alcohol use: Yes    Alcohol/week: 6.0 standard drinks of alcohol    Types: 6 Cans of beer per week    Comment: occasionally   Drug use: No     Allergies   Patient has no known allergies.   Review of Systems Review of Systems Per HPI  Physical Exam Triage Vital Signs ED Triage Vitals  Encounter Vitals Group     BP 05/02/24 1545 129/81     Girls Systolic BP Percentile --      Girls Diastolic BP Percentile --      Boys Systolic BP Percentile --      Boys Diastolic BP Percentile --      Pulse Rate 05/02/24 1545 76     Resp 05/02/24 1545 16     Temp 05/02/24 1545 98.2 F (36.8 C)     Temp Source 05/02/24 1545 Oral     SpO2 05/02/24 1545 98 %     Weight --      Height --      Head Circumference --      Peak Flow --      Pain Score 05/02/24 1543 6     Pain Loc --      Pain Education --      Exclude from Growth Chart --    No data found.  Updated Vital Signs BP  129/81 (BP Location: Right Arm)   Pulse 76   Temp 98.2 F (36.8 C) (Oral)   Resp 16   SpO2 98%   Visual Acuity Right Eye Distance:   Left Eye Distance:   Bilateral Distance:    Right Eye Near:   Left Eye Near:    Bilateral Near:     Physical Exam Vitals and nursing note reviewed.  Constitutional:      General: She is not in acute distress.    Appearance: Normal appearance.  HENT:     Head: Normocephalic.     Right Ear: Tympanic membrane, ear canal and external ear normal.     Left Ear: Tympanic membrane, ear canal and external ear normal.     Nose: Nose normal.     Mouth/Throat:     Lips: Pink.     Mouth: Mucous membranes are moist.     Dentition: Abnormal dentition. Dental tenderness and dental caries present. No gingival swelling or dental abscesses.     Tongue: No lesions.     Pharynx: Oropharynx is clear. Uvula midline.   Eyes:     Extraocular Movements: Extraocular movements intact.     Pupils: Pupils are equal, round, and reactive to light.  Cardiovascular:     Rate and Rhythm: Normal rate and regular rhythm.  Pulses: Normal pulses.     Heart sounds: Normal heart sounds.  Pulmonary:     Effort: Pulmonary effort is normal.     Breath sounds: Normal breath sounds.  Musculoskeletal:     Cervical back: Normal range of motion.  Skin:    General: Skin is warm and dry.  Neurological:     General: No focal deficit present.     Mental Status: She is alert and oriented to person, place, and time.  Psychiatric:        Mood and Affect: Mood normal.        Behavior: Behavior normal.      UC Treatments / Results  Labs (all labs ordered are listed, but only abnormal results are displayed) Labs Reviewed - No data to display  EKG   Radiology No results found.  Procedures Procedures (including critical care time)  Medications Ordered in UC Medications - No data to display  Initial Impression / Assessment and Plan / UC Course  I have reviewed the  triage vital signs and the nursing notes.  Pertinent labs & imaging results that were available during my care of the patient were reviewed by me and considered in my medical decision making (see chart for details).  Will treat for dentalgia with Augmentin  875/125 mg tablets twice daily for the next 7 days.  Patient has prescription for Vicodin that was issued last month, will have her continue for severe pain.  Supportive care recommendations were provided and discussed with the patient to include a soft diet, warm salt water  gargles, over-the-counter Orajel, and warm or cool compresses as needed for pain or swelling.  Patient was advised to follow-up with her dentist as scheduled.  Patient was in agreement with this plan of care and verbalizes understanding.  All questions were answered.  Patient stable for discharge.   Final Clinical Impressions(s) / UC Diagnoses   Final diagnoses:  None   Discharge Instructions   None    ED Prescriptions   None    PDMP not reviewed this encounter.   Gilmer Etta PARAS, NP 05/02/24 1621

## 2024-05-02 NOTE — ED Triage Notes (Signed)
 Pt reports she has upper left side teeth pain x 2 days

## 2024-05-02 NOTE — Discharge Instructions (Signed)
 Take medication as prescribed. Continue your current pain management for symptoms. You may purchase over-the-counter Orajel to help with pain or discomfort. Recommend warm salt water  rinses 3-4 times daily as needed for pain or discomfort. Apply warm or cool compresses as needed.  Apply warm compresses for pain or discomfort, cool compresses for pain or swelling. If symptoms worsen, recommend follow-up in the emergency department or with your dentist for further evaluation. Keep your scheduled appointment with your dentist. Follow-up as needed.

## 2024-05-05 ENCOUNTER — Telehealth: Payer: Self-pay | Admitting: Adult Health

## 2024-05-05 ENCOUNTER — Telehealth: Payer: Self-pay | Admitting: Internal Medicine

## 2024-05-05 ENCOUNTER — Telehealth: Payer: Self-pay

## 2024-05-05 NOTE — Patient Instructions (Signed)
 [name]  05/05/2024     @PREFPERIOPPHARMACY @   Your procedure is scheduled on Wednesday, 8/27.  [facility] to [facility] at 0630 A.M.  Call this number if you have problems the morning of surgery:  [phone]  If you experience any cold or flu symptoms such as cough, fever, chills, shortness of breath, etc. between now and your scheduled surgery, please notify us  at the above number.   Remember:  Do not eat or drink after midnight.  You may drink clear liquids until 0430 .  Clear liquids allowed are:                    Water , Carbonated beverages (diabetics please choose diet or no sugar options), Clear Tea (No creamer, milk, or cream, including half & half and powdered creamer), Black Coffee Only (No creamer, milk or cream, including half & half and powdered creamer), and Clear Sports drink (No red color; diabetics please choose diet or no sugar options)    Take these medicines the morning of surgery with A SIP OF WATER  wellbutrin , carvedilol , gabapentin , prilosec, zoloft  and if needed: klonipin, norco, zanaflex     Do not wear jewelry, make-up or nail polish, including gel polish,  artificial nails, or any other type of covering on natural nails (fingers and  toes).  Do not wear lotions, powders, or perfumes, or deodorant.  Do not shave 48 hours prior to surgery.  Men may shave face and neck.  Do not bring valuables to the hospital.  [facility] is not responsible for any belongings or valuables.  Contacts, dentures or bridgework may not be worn into surgery.  Leave your suitcase in the car.  After surgery it may be brought to your room.  For patients admitted to the hospital, discharge time will be determined by your treatment team.  Patients discharged the day of surgery will not be allowed to drive home.   Name and phone number of your driver:   family Special instructions:  Do not take any insulin  the morning of your surgery.  Please read over the following fact  sheets that you were given. Pain Booklet, Surgical Site Infection Prevention, Anesthesia Post-op Instructions, and Care and Recovery After Surgery      How to Use Chlorhexidine  at Home in the Shower Chlorhexidine  gluconate (CHG) is a germ-killing (antiseptic) wash that's used to clean the skin. It can get rid of the germs that normally live on the skin and can keep them away for about 24 hours. If you're having surgery, you may be told to shower with CHG at home the night before surgery. This can help lower your risk for infection. To use CHG wash in the shower, follow the steps below. Supplies needed: CHG body wash. Clean washcloth. Clean towel. How to use CHG in the shower Follow these steps unless you're told to use CHG in a different way: Start the shower. Use your normal soap and shampoo to wash your face and hair. Turn off the shower or move out of the shower stream. Pour CHG onto a clean washcloth. Do not use any type of brush or rough sponge. Start at your neck, washing your body down to your toes. Make sure you: Wash the part of your body where the surgery will be done for at least 1 minute. Do not scrub. Do not use CHG on your head or face unless your health care provider tells you to. If it gets into your ears  or eyes, rinse them well with water . Do not wash your genitals with CHG. Wash your back and under your arms. Make sure to wash skin folds. Let the CHG sit on your skin for 1-2 minutes or as long as told. Rinse your entire body in the shower, including all body creases and folds. Turn off the shower. Dry off with a clean towel. Do not put anything on your skin afterward, such as powder, lotion, or perfume. Put on clean clothes or pajamas. If it's the night before surgery, sleep in clean sheets. General tips Use CHG only as told, and follow the instructions on the label. Use the full amount of CHG as told. This is often one bottle. Do not smoke and stay away from  flames after using CHG. Your skin may feel sticky after using CHG. This is normal. The sticky feeling will go away as the CHG dries. Do not use CHG: If you have a chlorhexidine  allergy or have reacted to chlorhexidine  in the past. On open wounds or areas of skin that have broken skin, cuts, or scrapes. On babies younger than 58 months of age. Contact a health care provider if: You have questions about using CHG. Your skin gets irritated or itchy. You have a rash after using CHG. You swallow any CHG. Call your local poison control center [phone] in the [place] Your eyes itch badly, or they become very red or swollen. Your hearing changes. You have trouble seeing. If you can't reach your provider, go to an urgent care or emergency room. Do not drive yourself. Get help right away if: You have swelling or tingling in your mouth or throat. You make high-pitched whistling sounds when you breathe, most often when you breathe out (wheeze). You have trouble breathing. These symptoms may be an emergency. Call 911 right away. Do not wait to see if the symptoms will go away. Do not drive yourself to the hospital. This information is not intended to replace advice given to you by your health care provider. Make sure you discuss any questions you have with your health care provider. Document Revised: 03/16/2023 Document Reviewed: 03/12/2022 Elsevier Patient Education  2024 Else[employer] Inc.General Anesthesia, Adult, Care After The following information offers guidance on how to care for yourself after your procedure. Your health care provider may also give you more specific instructions. If you have problems or questions, contact your health care provider. What can I expect after the procedure? After the procedure, it is common for people to: Have pain or discomfort at the IV site. Have nausea or vomiting. Have a sore throat or hoarseness. Have trouble concentrating. Feel cold or chills. Feel  weak, sleepy, or tired (fatigue). Have soreness and body aches. These can affect parts of the body that were not involved in surgery. Follow these instructions at home: For the time period you were told by your health care provider:  Rest. Do not participate in activities where you could fall or become injured. Do not drive or use machinery. Do not drink alcohol. Do not take sleeping pills or medicines that cause drowsiness. Do not make important decisions or sign legal documents. Do not take care of children on your own. General instructions Drink enough fluid to keep your urine pale yellow. If you have sleep apnea, surgery and certain medicines can increase your risk for breathing problems. Follow instructions from your health care provider about wearing your sleep device: Anytime you are sleeping, including during daytime naps. While taking prescription pain  medicines, sleeping medicines, or medicines that make you drowsy. Return to your normal activities as told by your health care provider. Ask your health care provider what activities are safe for you. Take over-the-counter and prescription medicines only as told by your health care provider. Do not use any products that contain nicotine or tobacco. These products include cigarettes, chewing tobacco, and vaping devices, such as e-cigarettes. These can delay incision healing after surgery. If you need help quitting, ask your health care provider. Contact a health care provider if: You have nausea or vomiting that does not get better with medicine. You vomit every time you eat or drink. You have pain that does not get better with medicine. You cannot urinate or have bloody urine. You develop a skin rash. You have a fever. Get help right away if: You have trouble breathing. You have chest pain. You vomit blood. These symptoms may be an emergency. Get help right away. Call 911. Do not wait to see if the symptoms will go away. Do  not drive yourself to the hospital. Summary After the procedure, it is common to have a sore throat, hoarseness, nausea, vomiting, or to feel weak, sleepy, or fatigue. For the time period you were told by your health care provider, do not drive or use machinery. Get help right away if you have difficulty breathing, have chest pain, or vomit blood. These symptoms may be an emergency. This information is not intended to replace advice given to you by your health care provider. Make sure you discuss any questions you have with your health care provider. Document Revised: 11/28/2021 Document Reviewed: 11/28/2021 Elsevier Patient Education  2024 Elsevier Inc.Bunion Surgery: What to Expect  Bunion surgery is done to get rid of a bunion. A bunion is a bump that forms slowly on the inner side of your big toe joint. You may need bunion surgery if your bunion: Is very big. Hurts a lot. Makes it hard for you to walk. Tell a health care provider about: Any allergies you have. All medicines you take. These include vitamins, herbs, eye drops, and creams. Any problems you or family members have had with anesthesia. Any bleeding problems you have. Any surgeries you've had. Any medical conditions you have. Whether you're pregnant or may be pregnant. What are the risks? Your health care provider will talk with you about risks. These may include: Infection. Bleeding or blood clots. Allergic reactions to medicines. Nerve damage. Pain, numbness, stiffness, or arthritis in your toe. The bunion coming back. The bone not healing fully. Surgery not helping with symptoms. What happens before the surgery? When to stop eating and drinking Eat and drink only as you've been told. You may be told this: 8 hours before your surgery Stop eating most foods. Do not eat meat, fried foods, or fatty foods. Eat only light foods, such as toast or crackers. All liquids are OK except energy drinks and alcohol. 6 hours  before your surgery Stop eating. Drink only clear liquids, such as water , clear fruit juice, black coffee, plain tea, and sports drinks. Do not drink energy drinks or alcohol. 2 hours before your surgery Stop drinking all liquids. You may be allowed to take medicines with small sips of water . If you do not eat and drink as told, your surgery may be delayed or canceled. Medicines Ask about changing or stopping: Any medicines you take. Any vitamins, herbs, or supplements you take. Do not take aspirin  or ibuprofen unless you're told to. Surgery safety  For your safety, you may: Need to wash your skin with a soap that kills germs. Get antibiotics. Have your surgery site marked. General instructions Do not smoke, vape, or use nicotine or tobacco for at least 4 weeks before the surgery. Ask if you'll be staying overnight in the hospital. If you'll be going home right after the surgery, plan to have a responsible adult: Drive you home from the hospital or clinic. You won't be allowed to drive. Stay with you for the time you're told. What happens during the surgery? An IV will be put into a vein in your hand or arm. You may be given: A sedative to help you relax. Anesthesia to keep you from feeling pain. A cut will be made over the bump at the big toe joint. One or more of these procedures may be done: Exostectomy. This is to get rid of the bunion. Osteotomy. This is when the joint in your big toe is cut and put back where it should be. Arthrodesis. This is when screws are used to keep your foot in the right position. A procedure to tighten or loosen the tissues around your big toe. This may be done to move your toe back into the right place. The cut will be closed with stitches or skin glue. It'll be covered with tape strips or a bandage. A boot may be put on your foot to help support it. These steps may vary. Ask what you can expect. What happens after the surgery? You'll be watched  closely until you leave. This includes checking your pain level, blood pressure, heart rate, and breathing rate. If you were given a sedative, do not drive or use machines until you're told it's safe. A sedative can make you sleepy. Ask when it's safe to drive if you have a boot on your foot. Do not stand or walk on your injured foot until you're told it's OK. Use crutches or a walker as told. This information is not intended to replace advice given to you by your health care provider. Make sure you discuss any questions you have with your health care provider. Document Revised: 03/26/2023 Document Reviewed: 03/26/2023 Elsevier Patient Education  2024 [employer].

## 2024-05-05 NOTE — Telephone Encounter (Signed)
   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 Tobie Socks Group or Practice Name:  [facility]  Phone number:  [phone]  Fax number:  [phone]    Type of Clearance Requested:   - Medical    Type of Anesthesia:  MAC   Additional requests/questions:    [name]   05/05/2024, 2:33 PM

## 2024-05-05 NOTE — Telephone Encounter (Signed)
 Left message for pt to call back to schedule a tele preop appt that she will need to be added on today due to procedure date 05/10/24 and med hold ASA.

## 2024-05-05 NOTE — Telephone Encounter (Signed)
  Patient Consent for Virtual Visit        [name] has provided verbal consent on 05/05/2024 for a virtual visit (video or telephone).   CONSENT FOR VIRTUAL VISIT FOR:  [name]  By participating in this virtual visit I agree to the following:  I hereby voluntarily request, consent and authorize [facility] and its employed or contracted physicians, physician assistants, nurse practitioners or other licensed health care professionals (the Practitioner), to provide me with telemedicine health care services (the "Services) as deemed necessary by the treating Practitioner. I acknowledge and consent to receive the Services by the Practitioner via telemedicine. I understand that the telemedicine visit will involve communicating with the Practitioner through live audiovisual communication technology and the disclosure of certain medical information by electronic transmission. I acknowledge that I have been given the opportunity to request an in-person assessment or other available alternative prior to the telemedicine visit and am voluntarily participating in the telemedicine visit.  I understand that I have the right to withhold or withdraw my consent to the use of telemedicine in the course of my care at any time, without affecting my right to future care or treatment, and that the Practitioner or I may terminate the telemedicine visit at any time. I understand that I have the right to inspect all information obtained and/or recorded in the course of the telemedicine visit and may receive copies of available information for a reasonable fee.  I understand that some of the potential risks of receiving the Services via telemedicine include:  Delay or interruption in medical evaluation due to technological equipment failure or disruption; Information transmitted may not be sufficient (e.g. poor resolution of images) to allow for appropriate medical decision making by the  Practitioner; and/or  In rare instances, security protocols could fail, causing a breach of personal health information.  Furthermore, I acknowledge that it is my responsibility to provide information about my medical history, conditions and care that is complete and accurate to the best of my ability. I acknowledge that Practitioner's advice, recommendations, and/or decision may be based on factors not within their control, such as incomplete or inaccurate data provided by me or distortions of diagnostic images or specimens that may result from electronic transmissions. I understand that the practice of medicine is not an exact science and that Practitioner makes no warranties or guarantees regarding treatment outcomes. I acknowledge that a copy of this consent can be made available to me via my patient portal [facility] MyChart), or I can request a printed copy by calling the office of [facility].    I understand that my insurance will be billed for this visit.   I have read or had this consent read to me. I understand the contents of this consent, which adequately explains the benefits and risks of the Services being provided via telemedicine.  I have been provided ample opportunity to ask questions regarding this consent and the Services and have had my questions answered to my satisfaction. I give my informed consent for the services to be provided through the use of telemedicine in my medical care

## 2024-05-05 NOTE — Telephone Encounter (Signed)
   Name: [name]  DOB: 1950-12-15  MRN: [id number]  Primary Cardiologist: Danelle Birmingham, MD   Preoperative team, please contact this patient and set up a phone call appointment for further preoperative risk assessment. Please obtain consent and complete medication review. Thank you for your help. Surgery is scheduled for 05/10/2024, needs ASAP appt.   I confirm that guidance regarding antiplatelet and oral anticoagulation therapy has been completed and, if necessary, noted below.  Per office protocol, if patient is without any new symptoms or concerns at the time of their virtual visit, he/she may hold Aspirin  for 7 days prior to procedure. Please resume Aspirin  as soon as possible postprocedure, at the discretion of the surgeon.    I also confirmed the patient resides in the state of [place] . As per [facility] Medical Board telemedicine laws, the patient must reside in the state in which the provider is licensed.   Lamarr Satterfield, NP 05/05/2024, 2:57 PM [facility]

## 2024-05-08 ENCOUNTER — Telehealth: Payer: Self-pay

## 2024-05-08 ENCOUNTER — Encounter: Payer: Self-pay | Admitting: Internal Medicine

## 2024-05-08 ENCOUNTER — Encounter (HOSPITAL_COMMUNITY)
Admission: RE | Admit: 2024-05-08 | Discharge: 2024-05-08 | Disposition: A | Discharge status: Home / Self Care | Source: Ambulatory Visit | Attending: Podiatry | Admitting: Podiatry

## 2024-05-08 ENCOUNTER — Encounter (HOSPITAL_COMMUNITY): Attending: Cardiology | Admitting: Nurse Practitioner

## 2024-05-08 ENCOUNTER — Encounter (HOSPITAL_COMMUNITY): Payer: Self-pay

## 2024-05-08 ENCOUNTER — Other Ambulatory Visit: Payer: Self-pay

## 2024-05-08 VITALS — Ht 61.0 in | Wt 123.0 lb

## 2024-05-08 DIAGNOSIS — D631 Anemia in chronic kidney disease: Secondary | ICD-10-CM

## 2024-05-08 DIAGNOSIS — D509 Iron deficiency anemia, unspecified: Secondary | ICD-10-CM

## 2024-05-08 DIAGNOSIS — Z01810 Encounter for preprocedural cardiovascular examination: Secondary | ICD-10-CM | POA: Insufficient documentation

## 2024-05-08 DIAGNOSIS — N184 Chronic kidney disease, stage 4 (severe): Secondary | ICD-10-CM

## 2024-05-08 NOTE — Telephone Encounter (Signed)
 Patient returning Pre-op call

## 2024-05-08 NOTE — Telephone Encounter (Signed)
 Error

## 2024-05-08 NOTE — Telephone Encounter (Signed)
 Patient returned Pre-op call.

## 2024-05-08 NOTE — Telephone Encounter (Signed)
  Patient Consent for Virtual Visit        [name] has provided verbal consent on 05/08/2024 for a virtual visit (video or telephone).  Appointment scheduled for today, May 08, 2024 @ 3:20. Med req and consent are complete. Call patient at [phone]    CONSENT FOR VIRTUAL VISIT FOR:  [name]  By participating in this virtual visit I agree to the following:  I hereby voluntarily request, consent and authorize [facility] and its employed or contracted physicians, physician assistants, nurse practitioners or other licensed health care professionals (the Practitioner), to provide me with telemedicine health care services (the "Services) as deemed necessary by the treating Practitioner. I acknowledge and consent to receive the Services by the Practitioner via telemedicine. I understand that the telemedicine visit will involve communicating with the Practitioner through live audiovisual communication technology and the disclosure of certain medical information by electronic transmission. I acknowledge that I have been given the opportunity to request an in-person assessment or other available alternative prior to the telemedicine visit and am voluntarily participating in the telemedicine visit.  I understand that I have the right to withhold or withdraw my consent to the use of telemedicine in the course of my care at any time, without affecting my right to future care or treatment, and that the Practitioner or I may terminate the telemedicine visit at any time. I understand that I have the right to inspect all information obtained and/or recorded in the course of the telemedicine visit and may receive copies of available information for a reasonable fee.  I understand that some of the potential risks of receiving the Services via telemedicine include:  Delay or interruption in medical evaluation due to technological equipment failure or disruption; Information  transmitted may not be sufficient (e.g. poor resolution of images) to allow for appropriate medical decision making by the Practitioner; and/or  In rare instances, security protocols could fail, causing a breach of personal health information.  Furthermore, I acknowledge that it is my responsibility to provide information about my medical history, conditions and care that is complete and accurate to the best of my ability. I acknowledge that Practitioner's advice, recommendations, and/or decision may be based on factors not within their control, such as incomplete or inaccurate data provided by me or distortions of diagnostic images or specimens that may result from electronic transmissions. I understand that the practice of medicine is not an exact science and that Practitioner makes no warranties or guarantees regarding treatment outcomes. I acknowledge that a copy of this consent can be made available to me via my patient portal [facility] MyChart), or I can request a printed copy by calling the office of [facility].    I understand that my insurance will be billed for this visit.   I have read or had this consent read to me. I understand the contents of this consent, which adequately explains the benefits and risks of the Services being provided via telemedicine.  I have been provided ample opportunity to ask questions regarding this consent and the Services and have had my questions answered to my satisfaction. I give my informed consent for the services to be provided through the use of telemedicine in my medical care

## 2024-05-08 NOTE — Progress Notes (Unsigned)
 Virtual Visit via Telephone Note   Because of WEDNESDAY ERICSSON co-morbid illnesses, she is at least at moderate risk for complications without adequate follow up.  This format is felt to be most appropriate for this patient at this time.  Due to technical limitations with video connection (technology), today's appointment will be conducted as an audio only telehealth visit, and HANAKO TIPPING verbally agreed to proceed in this manner.   All issues noted in this document were discussed and addressed.  No physical exam could be performed with this format.  Evaluation Performed:  Preoperative cardiovascular risk assessment _____________   Date:  05/08/2024   Patient ID:  [name], DOB 18-Dec-1950, MRN [id number] Patient Location:  Home Provider location:   Office  Primary Care Provider:  Shona Norleen PEDLAR, MD Primary Cardiologist:  Danelle Birmingham, MD  Chief Complaint / Patient Profile   73 y.o. y/o female with a h/o NICM/chronic HFrEF s/p BiV ICD implant downgraded to BiV PPM, LBBB, HTN, HLD, type 2 diabetes who is pending excision, exostosis right 5th toe, correction of hammer toe with Dr. Tobie on 05/10/24 and presents today for telephonic preoperative cardiovascular risk assessment.  History of Present Illness    [name] is a 73 y.o. female who presents via audio/video conferencing for a telehealth visit today.  Pt was last seen in cardiology clinic on 08/11/2023 by Dr. Birmingham.  At that time [name] was doing well.  The patient is now pending procedure as outlined above. Since her last visit, she *** denies chest pain, shortness of breath, lower extremity edema, fatigue, palpitations, melena, hematuria, hemoptysis, diaphoresis, weakness, presyncope, syncope, orthopnea, and PND.   Past Medical History    Past Medical History:  Diagnosis Date   Anxiety    Arthritis    Cardiomyopathy    Cervical high risk HPV (human papillomavirus) test positive 01/30/2015   CHF  (congestive heart failure) (HCC)    Chronic alcohol abuse 06/09/2020   Chronic constipation    Chronic kidney disease    Chronic lower back pain    Constipation 01/30/2015   Depression    GERD (gastroesophageal reflux disease)    Heart failure    Chronic combined   Heart murmur    HTN (hypertension)    Hypercholesteremia    Neurogenic bladder    2008 nerve damage s/p back ssurgery   Presence of permanent cardiac pacemaker    Type II diabetes mellitus [facility])    Past Surgical History:  Procedure Laterality Date   ACHILLES TENDON SURGERY Right 06/14/2012   BACK SURGERY  09/15/2003   BI-VENTRICULAR IMPLANTABLE CARDIOVERTER DEFIBRILLATOR  (CRT-D)  12/18/2013   upgrade of previously implanted dual chamber pacemaker to MDT CRTD with RV lead revision by Dr Birmingham DUSTMAN PACEMAKER UPGRADE  12/18/2013   w/defibrillator   BIOPSY  04/13/2023   Procedure: BIOPSY;  Surgeon: Eartha Angelia Sieving, MD;  [facility];  Service: Gastroenterology;;   BIV PACEMAKER GENERATOR CHANGE OUT N/A 12/18/2013   Procedure: BIV PACEMAKER GENERATOR CHANGE OUT;  Surgeon: Danelle LELON Birmingham, MD;  Location: [facility] CATH LAB;  Service: Cardiovascular;  Laterality: N/A;   BIV PACEMAKER INSERTION CRT-P N/A 02/22/2020   Procedure: DOWNGRADE BIV PACEMAKER INSERTION CRT-P;  Surgeon: Birmingham Danelle LELON, MD;  Location: [facility] INVASIVE CV LAB;  Service: Cardiovascular;  Laterality: N/A;   BREAST BIOPSY Left 09/15/1971   benign   BREAST EXCISIONAL BIOPSY     age 64 benign  CATARACT EXTRACTION Bilateral    CESAREAN SECTION  09/15/1979   CHOLECYSTECTOMY     COLONOSCOPY WITH PROPOFOL  N/A 04/13/2023   Procedure: COLONOSCOPY WITH PROPOFOL ;  Surgeon: Eartha Angelia Sieving, MD;  [facility]: [facility];  Service: Gastroenterology;  Laterality: N/A;  1:30PM;ASA3   ESOPHAGOGASTRODUODENOSCOPY (EGD) WITH PROPOFOL  N/A 04/13/2023   Procedure: ESOPHAGOGASTRODUODENOSCOPY (EGD) WITH PROPOFOL ;  Surgeon: Eartha Angelia Sieving, MD;  [facility]: [facility];  Service: Gastroenterology;  Laterality: N/A;  1:30PM;ASA3   FLEXOR TENOTOMY  Right 08/25/2023   Procedure: FLEXOR TENOTOMY OF THE RIGHT 4TH TOE;  Surgeon: Tobie Buckles, DPM;  Location: [facility];  Service: Podiatry;  Laterality: Right;   GASTROCNEMIUS RECESSION  06/24/2012   Procedure: GASTROCNEMIUS SLIDE;  Surgeon: Deward DELENA Schwartz, MD;  Location: [facility] OR[facility];  Service: Orthopedics;  Laterality: Right;   HERNIA REPAIR     LACERATION REPAIR Left 02/17/2022   Procedure: REPAIR LEFT CHEEK COMPLEX LACERATION WITH MYRIAD PLACEMENT;  Surgeon: Lowery Estefana RAMAN, DO;  Location: [facility] OR;  Service: Plastics;  Laterality: Left;   LEAD REVISION N/A 12/18/2013   Procedure: LEAD REVISION;  Surgeon: Danelle LELON Birmingham, MD;  Location: [facility] CATH LAB;  Service: Cardiovascular;  Laterality: N/A;   PACEMAKER INSERTION  09/14/2004   Dual chamber MDT pacemaker implanted - subsequent upgrade to CRTD 12-2013   POLYPECTOMY  04/13/2023   Procedure: POLYPECTOMY;  Surgeon: Eartha Angelia Sieving, MD;  [facility];  Service: Gastroenterology;;   POSTERIOR LUMBAR FUSION  2006; 2008   SIGMOIDOSCOPY  MAY 2011 w/ PROP   iTCS-->FSIG-poor bowel prep   SKIN FULL THICKNESS GRAFT Left 04/06/2022   Procedure: SKIN GRAFT FULL THICKNESS;  Surgeon: Lowery Estefana RAMAN, DO;  Location: [facility];  Service: Plastics;  Laterality: Left;   TOE ARTHROPLASTY Right 08/25/2023   Procedure: TOE ARTHROPLASTY OF RIGHT 4TH TOE AND DEROTATION ARTHROPLASTY OF RIGHT 5TH TOE;  Surgeon: Tobie Buckles, DPM;  Location: [facility];  Service: Podiatry;  Laterality: Right;   TOTAL HIP ARTHROPLASTY Left 06/18/2022   Procedure: LEFT TOTAL HIP ARTHROPLASTY ANTERIOR APPROACH;  Surgeon: Vernetta Lonni GRADE, MD;  Location: [facility];  Service: Orthopedics;  Laterality: Left;   TUBAL LIGATION  ~ 1984   UPPER GASTROINTESTINAL ENDOSCOPY  MAY 2011 w/ PROP   MILD GASTRITIS 2o ETOH   WISDOM TOOTH  EXTRACTION      Allergies  No Known Allergies  Home Medications    Prior to Admission medications   Medication Sig Start Date End Date Taking? Authorizing Provider  amoxicillin -clavulanate (AUGMENTIN ) 875-125 MG tablet Take 1 tablet by mouth every 12 (twelve) hours. 05/02/24   Leath-Warren, Etta PARAS, NP  aspirin  EC 81 MG tablet Take 81 mg by mouth in the morning. Swallow whole.    [provider]  buPROPion  (WELLBUTRIN  XL) 300 MG 24 hr tablet Take 300 mg by mouth daily. 06/19/20   [provider]  calcitRIOL (ROCALTROL) 0.25 MCG capsule Take 0.25 mcg by mouth See admin instructions. Sunday, Wednesday and Friday    [provider]  carvedilol  (COREG ) 12.5 MG tablet Take 12.5 mg by mouth 2 (two) times daily. 11/16/22   [provider]  clonazePAM  (KLONOPIN ) 1 MG tablet Take 1 mg by mouth 3 (three) times daily as needed for anxiety.    [provider]  furosemide  (LASIX ) 40 MG tablet Take 40 mg by mouth every Monday, Wednesday, and Friday.    [provider]  gabapentin  (NEURONTIN ) 600 MG tablet Take 600 mg by  mouth at bedtime.    [provider]  HYDROcodone -acetaminophen  (NORCO) 10-325 MG tablet Take 1 tablet by mouth in the morning and at bedtime.    [provider]  insulin  glargine (LANTUS ) 100 UNIT/ML injection Inject 3 Units into the skin in the morning.    [provider]  insulin  lispro (HUMALOG  KWIKPEN) 100 UNIT/ML KwikPen Inject 5-7 Units into the skin in the morning. Per sliding scale    [provider]  losartan  (COZAAR ) 25 MG tablet Take 25 mg by mouth daily.    [provider]  Multiple Vitamins-Minerals (PRESERVISION AREDS PO) Take 1 capsule by mouth in the morning and at bedtime.    [provider]  nitrofurantoin  (MACRODANTIN ) 50 MG capsule Take 50 mg by mouth in the morning. 04/15/24   [provider]  omeprazole (PRILOSEC) 20 MG capsule Take 20 mg by mouth in the  morning.    [provider]  OZEMPIC, 0.25 OR 0.5 MG/DOSE, 2 MG/3ML SOPN Inject 0.5 mg into the skin every Saturday. 02/03/23   [provider]  Perfluorohexyloctane (MIEBO) 1.338 GM/ML SOLN Place 1 drop into both eyes in the morning, at noon, and at bedtime.    [provider]  potassium chloride  SA (KLOR-CON  M) 20 MEQ tablet Take 20 mEq by mouth in the morning. 06/19/20   [provider]  Propylene Glycol (SYSTANE BALANCE) 0.6 % SOLN Place 1 drop into both eyes in the morning and at bedtime.    [provider]  rosuvastatin  (CRESTOR ) 40 MG tablet TAKE ONE (1) TABLET BY MOUTH EVERY DAY (STOP ATORVASTATIN ) 01/29/20   Corum, Olam CROME, MD  sertraline  (ZOLOFT ) 100 MG tablet Take 200 mg by mouth in the morning.    [provider]  sodium bicarbonate  650 MG tablet Take 650 mg by mouth in the morning and at bedtime.    [provider]  tiZANidine  (ZANAFLEX ) 4 MG tablet Take 4 mg by mouth at bedtime.    [provider]  zolpidem  (AMBIEN ) 10 MG tablet Take 10 mg by mouth at bedtime. 12/04/21   [provider]    Physical Exam    Vital Signs:  Clarita CROME Sitter does not have vital signs available for review today.  Given telephonic nature of communication, physical exam is limited. AAOx3. NAD. Normal affect.  Speech and respirations are unlabored.  Accessory Clinical Findings    None  Assessment & Plan    1.  Preoperative Cardiovascular Risk Assessment: According to the Revised Cardiac Risk Index (RCRI), her   Her   according to the Duke Activity Status Index (DASI).   The patient was advised that if she develops new symptoms prior to surgery to contact our office to arrange for a follow-up visit, and she verbalized understanding.  Per office protocol, she may hold aspirin  for 7 days prior to procedure and should resume as soon as hemodynamically stable postoperatively.   A copy of this note will be routed to  requesting surgeon.  Time:   Today, I have spent *** minutes with the patient with telehealth technology discussing medical history, symptoms, and management plan.     Rosaline EMERSON Bane, NP-C  05/08/2024, 11:31 AM [address], [address] [place], [place] [address] Office [phone] Fax [phone]

## 2024-05-08 NOTE — Telephone Encounter (Signed)
  Rhonda from [facility] called to help the pt schedule her televisit. She said to call her back to schedule to make sure the patient get scheduled

## 2024-05-08 NOTE — Telephone Encounter (Signed)
 Have attempted several times to reach patient by telephone and call goes straight to voice mail. Left detailed message that she had a virtual appointment scheduled today and gave her the return number and to ask for the preop team.  Rosaline EMERSON Bane, NP-C  05/08/2024, 4:38 PM [address], [address] [place], [place] [address] Office [phone] Fax [phone]

## 2024-05-08 NOTE — Telephone Encounter (Signed)
 Patient stated she is returning Pre-op call.

## 2024-05-08 NOTE — Telephone Encounter (Signed)
 Appointment scheduled for today, May 08, 2024 @ 3:20. Med req and consent are complete. Call patient at [phone]

## 2024-05-09 ENCOUNTER — Ambulatory Visit (HOSPITAL_COMMUNITY)
Admission: RE | Admit: 2024-05-09 | Discharge: 2024-05-09 | Disposition: A | Discharge status: Home / Self Care | Source: Ambulatory Visit | Attending: Podiatry | Admitting: Podiatry

## 2024-05-09 ENCOUNTER — Other Ambulatory Visit (HOSPITAL_COMMUNITY): Payer: Self-pay | Admitting: Podiatry

## 2024-05-09 ENCOUNTER — Other Ambulatory Visit (HOSPITAL_COMMUNITY)
Admission: RE | Admit: 2024-05-09 | Discharge: 2024-05-09 | Disposition: A | Discharge status: Home / Self Care | Source: Ambulatory Visit | Attending: Podiatry | Admitting: Podiatry

## 2024-05-09 DIAGNOSIS — E1122 Type 2 diabetes mellitus with diabetic chronic kidney disease: Secondary | ICD-10-CM | POA: Diagnosis present

## 2024-05-09 DIAGNOSIS — Q669 Congenital deformity of feet, unspecified, unspecified foot: Secondary | ICD-10-CM

## 2024-05-09 LAB — CBC WITH DIFFERENTIAL/PLATELET
Abs Immature Granulocytes: 0.01 K/uL (ref 0.00–0.07)
Basophils Absolute: 0 K/uL (ref 0.0–0.1)
Basophils Relative: 1 %
Eosinophils Absolute: 0.1 K/uL (ref 0.0–0.5)
Eosinophils Relative: 3 %
HCT: 36.1 % (ref 36.0–46.0)
Hemoglobin: 11.6 g/dL — ABNORMAL LOW (ref 12.0–15.0)
Immature Granulocytes: 0 %
Lymphocytes Relative: 28 %
Lymphs Abs: 1.3 K/uL (ref 0.7–4.0)
MCH: 31.7 pg (ref 26.0–34.0)
MCHC: 32.1 g/dL (ref 30.0–36.0)
MCV: 98.6 fL (ref 80.0–100.0)
Monocytes Absolute: 0.5 K/uL (ref 0.1–1.0)
Monocytes Relative: 10 %
Neutro Abs: 2.8 K/uL (ref 1.7–7.7)
Neutrophils Relative %: 58 %
Platelets: 170 K/uL (ref 150–400)
RBC: 3.66 MIL/uL — ABNORMAL LOW (ref 3.87–5.11)
RDW: 12.1 % (ref 11.5–15.5)
WBC: 4.7 K/uL (ref 4.0–10.5)
nRBC: 0 % (ref 0.0–0.2)

## 2024-05-09 LAB — HEMOGLOBIN A1C
Hgb A1c MFr Bld: 6.8 % — ABNORMAL HIGH (ref 4.8–5.6)
Mean Plasma Glucose: 148.46 mg/dL

## 2024-05-09 LAB — BASIC METABOLIC PANEL WITH GFR
Anion gap: 13 (ref 5–15)
BUN: 47 mg/dL — ABNORMAL HIGH (ref 8–23)
CO2: 20 mmol/L — ABNORMAL LOW (ref 22–32)
Calcium: 8.8 mg/dL — ABNORMAL LOW (ref 8.9–10.3)
Chloride: 108 mmol/L (ref 98–111)
Creatinine, Ser: 3.09 mg/dL — ABNORMAL HIGH (ref 0.44–1.00)
GFR, Estimated: 16 mL/min — ABNORMAL LOW (ref >60)
Glucose, Bld: 160 mg/dL — ABNORMAL HIGH (ref 70–99)
Potassium: 4.4 mmol/L (ref 3.5–5.1)
Sodium: 141 mmol/L (ref 135–145)

## 2024-05-10 ENCOUNTER — Encounter (HOSPITAL_COMMUNITY)
Admission: RE | Disposition: A | Discharge status: Home / Self Care | Payer: Self-pay | Source: Home / Self Care | Attending: Podiatry

## 2024-05-10 ENCOUNTER — Ambulatory Visit (HOSPITAL_COMMUNITY)

## 2024-05-10 ENCOUNTER — Encounter (HOSPITAL_COMMUNITY): Payer: Self-pay | Admitting: Podiatry

## 2024-05-10 ENCOUNTER — Ambulatory Visit (HOSPITAL_COMMUNITY): Admitting: Anesthesiology

## 2024-05-10 ENCOUNTER — Ambulatory Visit (HOSPITAL_COMMUNITY)
Admission: RE | Admit: 2024-05-10 | Discharge: 2024-05-10 | Disposition: A | Discharge status: Home / Self Care | Attending: Podiatry | Admitting: Podiatry

## 2024-05-10 ENCOUNTER — Ambulatory Visit (HOSPITAL_BASED_OUTPATIENT_CLINIC_OR_DEPARTMENT_OTHER): Admitting: Anesthesiology

## 2024-05-10 DIAGNOSIS — F419 Anxiety disorder, unspecified: Secondary | ICD-10-CM | POA: Diagnosis not present

## 2024-05-10 DIAGNOSIS — M2031 Hallux varus (acquired), right foot: Secondary | ICD-10-CM

## 2024-05-10 DIAGNOSIS — E119 Type 2 diabetes mellitus without complications: Secondary | ICD-10-CM | POA: Diagnosis not present

## 2024-05-10 DIAGNOSIS — F32A Depression, unspecified: Secondary | ICD-10-CM | POA: Insufficient documentation

## 2024-05-10 DIAGNOSIS — M205X1 Other deformities of toe(s) (acquired), right foot: Secondary | ICD-10-CM | POA: Insufficient documentation

## 2024-05-10 DIAGNOSIS — Z79899 Other long term (current) drug therapy: Secondary | ICD-10-CM | POA: Diagnosis not present

## 2024-05-10 DIAGNOSIS — D631 Anemia in chronic kidney disease: Secondary | ICD-10-CM

## 2024-05-10 DIAGNOSIS — I509 Heart failure, unspecified: Secondary | ICD-10-CM | POA: Insufficient documentation

## 2024-05-10 DIAGNOSIS — I110 Hypertensive heart disease with heart failure: Secondary | ICD-10-CM | POA: Insufficient documentation

## 2024-05-10 DIAGNOSIS — M2041 Other hammer toe(s) (acquired), right foot: Secondary | ICD-10-CM | POA: Diagnosis not present

## 2024-05-10 DIAGNOSIS — D649 Anemia, unspecified: Secondary | ICD-10-CM | POA: Diagnosis not present

## 2024-05-10 DIAGNOSIS — Z7982 Long term (current) use of aspirin: Secondary | ICD-10-CM | POA: Insufficient documentation

## 2024-05-10 DIAGNOSIS — K219 Gastro-esophageal reflux disease without esophagitis: Secondary | ICD-10-CM | POA: Insufficient documentation

## 2024-05-10 DIAGNOSIS — Z794 Long term (current) use of insulin: Secondary | ICD-10-CM | POA: Diagnosis not present

## 2024-05-10 DIAGNOSIS — Z950 Presence of cardiac pacemaker: Secondary | ICD-10-CM | POA: Insufficient documentation

## 2024-05-10 DIAGNOSIS — I5032 Chronic diastolic (congestive) heart failure: Secondary | ICD-10-CM | POA: Diagnosis not present

## 2024-05-10 DIAGNOSIS — M2030 Hallux varus (acquired), unspecified foot: Secondary | ICD-10-CM | POA: Diagnosis present

## 2024-05-10 DIAGNOSIS — E1122 Type 2 diabetes mellitus with diabetic chronic kidney disease: Secondary | ICD-10-CM

## 2024-05-10 HISTORY — PX: EXOSTECTECTOMY TOE: SHX6618

## 2024-05-10 HISTORY — PX: HAMMER TOE SURGERY: SHX385

## 2024-05-10 LAB — GLUCOSE, CAPILLARY
Glucose-Capillary: 157 mg/dL — ABNORMAL HIGH (ref 70–99)
Glucose-Capillary: 183 mg/dL — ABNORMAL HIGH (ref 70–99)

## 2024-05-10 SURGERY — EXCISION, EXOSTOSIS, TOE
Anesthesia: General | Site: Toe | Laterality: Right

## 2024-05-10 MED ORDER — CEFAZOLIN SODIUM-DEXTROSE 2-4 GM/100ML-% IV SOLN
2.0000 g | Freq: Once | INTRAVENOUS | Status: AC
  Administered 2024-05-10: 2 g via INTRAVENOUS
  Filled 2024-05-10: qty 100

## 2024-05-10 MED ORDER — FENTANYL CITRATE PF 50 MCG/ML IJ SOSY: 25.0000 ug | PREFILLED_SYRINGE | INTRAMUSCULAR | Status: DC | PRN

## 2024-05-10 MED ORDER — 0.9 % SODIUM CHLORIDE (POUR BTL) OPTIME
TOPICAL | Status: DC | PRN
  Administered 2024-05-10: 1000 mL

## 2024-05-10 MED ORDER — LACTATED RINGERS IV SOLN: INTRAVENOUS | Status: DC | PRN

## 2024-05-10 MED ORDER — FENTANYL CITRATE (PF) 100 MCG/2ML IJ SOLN
INTRAMUSCULAR | Status: AC
Start: 2024-05-10 — End: 2024-05-10
  Filled 2024-05-10: qty 2

## 2024-05-10 MED ORDER — OXYCODONE HCL 5 MG PO TABS: 5.0000 mg | ORAL_TABLET | Freq: Once | ORAL | Status: DC | PRN

## 2024-05-10 MED ORDER — MIDAZOLAM HCL 2 MG/2ML IJ SOLN
INTRAMUSCULAR | Status: DC | PRN
Start: 2024-05-10 — End: 2024-05-10
  Administered 2024-05-10: 2 mg via INTRAVENOUS

## 2024-05-10 MED ORDER — ONDANSETRON HCL 4 MG/2ML IJ SOLN
INTRAMUSCULAR | Status: DC | PRN
  Administered 2024-05-10: 4 mg via INTRAVENOUS

## 2024-05-10 MED ORDER — PROPOFOL 10 MG/ML IV BOLUS
INTRAVENOUS | Status: AC
  Filled 2024-05-10: qty 20

## 2024-05-10 MED ORDER — LIDOCAINE HCL (PF) 1 % IJ SOLN
INTRAMUSCULAR | Status: AC
  Filled 2024-05-10: qty 30

## 2024-05-10 MED ORDER — BUPIVACAINE HCL (PF) 0.5 % IJ SOLN
INTRAMUSCULAR | Status: DC | PRN
  Administered 2024-05-10: 8 mL
  Administered 2024-05-10: 5 mL

## 2024-05-10 MED ORDER — BUPIVACAINE HCL (PF) 0.5 % IJ SOLN
INTRAMUSCULAR | Status: AC
  Filled 2024-05-10: qty 30

## 2024-05-10 MED ORDER — OXYCODONE HCL 5 MG/5ML PO SOLN: 5.0000 mg | Freq: Once | ORAL | Status: DC | PRN

## 2024-05-10 MED ORDER — MIDAZOLAM HCL 2 MG/2ML IJ SOLN
INTRAMUSCULAR | Status: AC
  Filled 2024-05-10: qty 2

## 2024-05-10 MED ORDER — ONDANSETRON HCL 4 MG/2ML IJ SOLN: 4.0000 mg | Freq: Once | INTRAMUSCULAR | Status: DC | PRN

## 2024-05-10 MED ORDER — PROPOFOL 500 MG/50ML IV EMUL
INTRAVENOUS | Status: DC | PRN
  Administered 2024-05-10: 100 ug/kg/min via INTRAVENOUS

## 2024-05-10 MED ORDER — FENTANYL CITRATE (PF) 100 MCG/2ML IJ SOLN
INTRAMUSCULAR | Status: DC | PRN
  Administered 2024-05-10 (×2): 50 ug via INTRAVENOUS

## 2024-05-10 MED ORDER — LIDOCAINE HCL (PF) 1 % IJ SOLN
INTRAMUSCULAR | Status: DC | PRN
  Administered 2024-05-10: 5 mL

## 2024-05-10 SURGICAL SUPPLY — 43 items
BANDAGE ESMARK 4X12 BL STRL LF (DISPOSABLE) ×3 IMPLANT
BENZOIN TINCTURE PRP APPL 2/3 (GAUZE/BANDAGES/DRESSINGS) IMPLANT
BLADE OSC/SAG 11.5X5.5X.38 (BLADE) IMPLANT
BLADE SURG 15 STRL LF DISP TIS (BLADE) ×3 IMPLANT
BNDG CONFORM 2 STRL LF (GAUZE/BANDAGES/DRESSINGS) ×3 IMPLANT
BNDG ELASTIC 4X5.8 VLCR NS LF (GAUZE/BANDAGES/DRESSINGS) ×3 IMPLANT
BNDG GAUZE ELAST 4 BULKY (GAUZE/BANDAGES/DRESSINGS) ×3 IMPLANT
BUR SRGRND 54.5X2.4X8 (BURR) IMPLANT
CAP PIN PROTECTOR ORTHO WHT (CAP) IMPLANT
CHLORAPREP W/TINT 26 (MISCELLANEOUS) ×3 IMPLANT
CLOTH BEACON ORANGE TIMEOUT ST (SAFETY) ×3 IMPLANT
COVER LIGHT HANDLE (MISCELLANEOUS) IMPLANT
DECANTER SPIKE VIAL GLASS SM (MISCELLANEOUS) ×6 IMPLANT
DRAPE OEC MINIVIEW 54X84 (DRAPES) ×3 IMPLANT
DRSG ADAPTIC 3X8 NADH LF (GAUZE/BANDAGES/DRESSINGS) ×3 IMPLANT
ELECTRODE REM PT RTRN 9FT ADLT (ELECTROSURGICAL) ×3 IMPLANT
GAUZE 4X4 16PLY ~~LOC~~+RFID DBL (SPONGE) ×3 IMPLANT
GAUZE SPONGE 4X4 12PLY STRL (GAUZE/BANDAGES/DRESSINGS) ×3 IMPLANT
GAUZE SPONGE 4X4 12PLY STRL LF (GAUZE/BANDAGES/DRESSINGS) IMPLANT
GLOVE BIO SURGEON STRL SZ7 (GLOVE) ×3 IMPLANT
GLOVE BIO SURGEON STRL SZ7.5 (GLOVE) ×3 IMPLANT
GLOVE BIOGEL PI IND STRL 7.0 (GLOVE) ×6 IMPLANT
GLOVE BIOGEL PI IND STRL 7.5 (GLOVE) ×6 IMPLANT
GLOVE ECLIPSE 7.0 STRL STRAW (GLOVE) ×3 IMPLANT
GOWN STRL REUS W/ TWL LRG LVL3 (GOWN DISPOSABLE) ×3 IMPLANT
GOWN STRL REUS W/TWL LRG LVL3 (GOWN DISPOSABLE) ×6 IMPLANT
KIT TURNOVER KIT A (KITS) ×3 IMPLANT
MANIFOLD NEPTUNE II (INSTRUMENTS) ×3 IMPLANT
NDL HYPO 25X1 1.5 SAFETY (NEEDLE) ×6 IMPLANT
NEEDLE HYPO 25X1 1.5 SAFETY (NEEDLE) ×4 IMPLANT
NS IRRIG 1000ML POUR BTL (IV SOLUTION) ×3 IMPLANT
PACK BASIC LIMB (CUSTOM PROCEDURE TRAY) ×3 IMPLANT
PAD ARMBOARD POSITIONER FOAM (MISCELLANEOUS) ×3 IMPLANT
POSITIONER HEAD 8X9X4 ADT (SOFTGOODS) ×3 IMPLANT
RASP SM TEAR CROSS CUT (RASP) IMPLANT
SET BASIN LINEN APH (SET/KITS/TRAYS/PACK) ×3 IMPLANT
SPONGE T-LAP 18X18 ~~LOC~~+RFID (SPONGE) ×3 IMPLANT
STRIP CLOSURE SKIN 1/2X4 (GAUZE/BANDAGES/DRESSINGS) ×3 IMPLANT
SUT ETHILON 4 0 P 3 18 (SUTURE) ×3 IMPLANT
SUT PROLENE 4 0 PS 2 18 (SUTURE) IMPLANT
SUT VIC AB 4-0 PS2 27 (SUTURE) ×3 IMPLANT
SUT VICRYL AB 3-0 FS1 BRD 27IN (SUTURE) IMPLANT
SYR CONTROL 10ML LL (SYRINGE) ×6 IMPLANT

## 2024-05-10 NOTE — Transfer of Care (Signed)
 Immediate Anesthesia Transfer of Care Note  Patient: [name]  Procedure(s) Performed: EXCISION, EXOSTOSIS, TOE (Right) CORRECTION, HAMMER TOE (Right: Toe)  Patient Location: PACU  Anesthesia Type:MAC  Level of Consciousness: awake, alert , oriented, and patient cooperative  Airway & Oxygen Therapy: Patient Spontanous Breathing  Post-op Assessment: Report given to RN, Post -op Vital signs reviewed and stable, and Patient moving all extremities X 4  Post vital signs: Reviewed and stable  Last Vitals:  Vitals Value Taken Time  BP 146/77 05/10/24 11:08  Temp 36.5 C 05/10/24 11:08  Pulse 66 05/10/24 11:08  Resp 13 05/10/24 11:08  SpO2 100 % 05/10/24 11:08    Last Pain:  Vitals:   05/10/24 1108  TempSrc: Oral  PainSc: 2       Patients Stated Pain Goal: 6 (05/10/24 1108)  Complications: No notable events documented.

## 2024-05-10 NOTE — Anesthesia Postprocedure Evaluation (Signed)
 Anesthesia Post Note  Patient: [name]  Procedure(s) Performed: EXCISION, EXOSTOSIS, TOE (Right) CORRECTION, HAMMER TOE (Right: Toe)  Patient location during evaluation: Phase II Anesthesia Type: General Level of consciousness: awake Pain management: pain level controlled Vital Signs Assessment: post-procedure vital signs reviewed and stable Respiratory status: spontaneous breathing and respiratory function stable Cardiovascular status: blood pressure returned to baseline and stable Postop Assessment: no headache and no apparent nausea or vomiting Anesthetic complications: no Comments: Late entry   No notable events documented.   Last Vitals:  Vitals:   05/10/24 1058 05/10/24 1108  BP: (!) 143/77 (!) 146/77  Pulse: 66 66  Resp: 16 13  Temp:  36.5 C  SpO2: 100% 100%    Last Pain:  Vitals:   05/10/24 1108  TempSrc: Oral  PainSc: 2                  Yvonna JINNY Bosworth

## 2024-05-10 NOTE — Brief Op Note (Signed)
 05/10/2024  10:14 AM  PATIENT:  Clarita LITTIE Sitter  73 y.o. 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  PROCEDURE:  Procedure(s) with comments: EXCISION, EXOSTOSIS, TOE (Right) - RIGHT 5TH TOE CORRECTION, HAMMER TOE (Right) - PIPJ FUSION OF RIGHT 4TH TOE  SURGEON:  Surgeons and Role:    * Tobie Buckles, DPM - Primary  PHYSICIAN ASSISTANT:   ASSISTANTS: none   ANESTHESIA:   local, IV sedation, and MAC  EBL:  None   BLOOD ADMINISTERED:none  DRAINS: none   LOCAL MEDICATIONS USED:  MARCAINE    , LIDOCAINE  , and Amount: 10 ml  SPECIMEN:  No Specimen  DISPOSITION OF SPECIMEN:  N/A  COUNTS:  YES  TOURNIQUET:   Total Tourniquet Time Documented: Ankle (Right) - 73 minutes Total: Ankle (Right) - 73 minutes   DICTATION: .Nechama Dictation  PLAN OF CARE: Discharge to home after PACU  PATIENT DISPOSITION:  PACU - hemodynamically stable.   Delay start of Pharmacological VTE agent (>24hrs) due to surgical blood loss or risk of bleeding: not applicable

## 2024-05-10 NOTE — Discharge Instructions (Signed)
 These instructions will give you an idea of what to expect after surgery and how to manage issues that may arise before your first post op office visit.  Pain Management Pain is best managed by "staying ahead" of it. If pain gets out of control, it is difficult to get it back under control. Local anesthesia that lasts 6-8 hours is used to numb the foot and decrease pain.  For the best pain control, take the pain medication every 4 hours for the first 2 days post op. On the third day pain medication can be taken as needed.   Post Op Nausea Nausea is common after surgery, so it is managed proactively.  If prescribed, use the prescribed nausea medication regularly for the first 2 days post op.  Bandages Do not worry if there is blood on the bandage. What looks like a lot of blood on the bandage is actually a small amount. Blood on the dressing spreads out as it is absorbed by the gauze, the same way a drop of water spreads out on a paper towel.  If the bandages feel wet or dry, stiff and uncomfortable, call the office during office hours and we will schedule a time for you to have the bandage changed.  Unless you are specifically told otherwise, we will do the first bandage change in the office.  Keep your bandage dry. If the bandage becomes wet or soiled, notify the office and we will schedule a time to change the bandage.  Activity It is best to spend most of the first 2 days after surgery lying down with the foot elevated above the level of your heart. You may put weight on your heel while wearing the surgical shoe.   You may only get up to go to the restroom.  Driving Do not drive until you are able to respond in an emergency (i.e. slam on the brakes). This usually occurs after the bone has healed - 6 to 8 weeks.  Call the Office If you have a fever over 101F.  If you have increasing pain after the initial post op pain has settled down.  If you have increasing redness, swelling, or  drainage.  If you have any questions or concerns.

## 2024-05-10 NOTE — Anesthesia Preprocedure Evaluation (Signed)
 Anesthesia Evaluation  Patient identified by MRN, date of birth, ID band Patient awake    Reviewed: Allergy & Precautions, H&P , NPO status , Patient's Chart, lab work & pertinent test results, reviewed documented beta blocker date and time   Airway Mallampati: II  TM Distance: >3 FB Neck ROM: full    Dental no notable dental hx.    Pulmonary neg pulmonary ROS   Pulmonary exam normal breath sounds clear to auscultation       Cardiovascular Exercise Tolerance: Good hypertension, +CHF  + pacemaker + Valvular Problems/Murmurs  Rhythm:regular Rate:Normal     Neuro/Psych  PSYCHIATRIC DISORDERS Anxiety Depression     Neuromuscular disease CVA    GI/Hepatic Neg liver ROS,GERD  ,,  Endo/Other  diabetes    Renal/GU Renal disease  negative genitourinary   Musculoskeletal   Abdominal   Peds  Hematology  (+) Blood dyscrasia, anemia   Anesthesia Other Findings   Reproductive/Obstetrics negative OB ROS                              Anesthesia Physical Anesthesia Plan  ASA: 3  Anesthesia Plan: General   Post-op Pain Management:    Induction:   PONV Risk Score and Plan: Propofol  infusion  Airway Management Planned:   Additional Equipment:   Intra-op Plan:   Post-operative Plan:   Informed Consent: I have reviewed the patients History and Physical, chart, labs and discussed the procedure including the risks, benefits and alternatives for the proposed anesthesia with the patient or authorized representative who has indicated his/her understanding and acceptance.     Dental Advisory Given  Plan Discussed with: CRNA  Anesthesia Plan Comments:         Anesthesia Quick Evaluation

## 2024-05-10 NOTE — H&P (Signed)
.  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  The various methods of treatment have been discussed with the patient.  No guarantees were given.  After consideration of risks, benefits and other options for treatment, the patient has consented to surgery.  I have reviewed the patients' chart and labs.    Patient Vitals for the past 24 hrs:  BP Temp Temp src Pulse Resp SpO2 Height Weight  05/10/24 0737 131/73 97.9 F (36.6 C) Oral 75 16 100 % 5' 1 (1.549 m) 55.8 kg    A history and physical examination was performed in my office.  The patient was reexamined.  There have been no changes to this history and physical examination.  Michal Blanch, DPM

## 2024-05-10 NOTE — Op Note (Signed)
 05/10/2024  10:14 AM  PATIENT:  [name]  73 y.o. 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  PROCEDURE:  Procedure(s) with comments: EXCISION, EXOSTOSIS, TOE (Right) - RIGHT 5TH TOE CORRECTION, HAMMER TOE (Right) - PIPJ FUSION OF RIGHT 4TH TOE  SURGEON:  Surgeons and Role:    * Tobie Buckles, DPM - Primary  PHYSICIAN ASSISTANT:   ASSISTANTS: none   ANESTHESIA:   local, IV sedation, and MAC  EBL:  None   BLOOD ADMINISTERED:none  DRAINS: none   LOCAL MEDICATIONS USED:  MARCAINE    , LIDOCAINE  , and Amount: 10 ml  SPECIMEN:  No Specimen  MATERIALS: 4'5 K wire, 3-0 Vicryl, 4-0 Prolene  TOURNIQUET:   Total Tourniquet Time Documented: Ankle (Right) - 73 minutes Total: Ankle (Right) - 73 minutes   DICTATION: .Nechama Dictation  PLAN OF CARE: Discharge to home after PACU  PATIENT DISPOSITION:  PACU - hemodynamically stable.    Patient was brought into the operating room laid supine on the operating table. Ankle tourniquet was applied to the surgical extremity. Following IV sedation, a local block was achieved using 10 cc of mixture of 1% plain lidocaine  with 0.5% marcaine . The right foot was the prepped, scrubbed and draped in aseptic manner. Using an esmarch band the tourniquet on the surgical site was inflatted at .    Attention was directed towards the right fourth toe. Old cicatrix noted across the PIPJ. Using the same old transverse incision, incision was made using #15 blade down to subcutenous tissue. The extensor tendon was isolated and cut transversely cut at the PIPJ. Upon refecting the tendon, previous arthroplasty of the head of the proximal phalanx noted with some exostosis. The arthroplasty site was resected and rasped for better edges. The base of the middle phalanx was resected using sagittal saw. Rasp was used to rasp down  smooth edges from both ends. At this 4'5 double ended k wire was used for fusion of the PIPJ. A guide hole was created in the proximal phalanx. At this time K wire was inserted in the middle of distal phalanx and out to the tip of the toe. The K wire was then retrograded back into the proximal phalanx. Fluroscopy was used to guide the K wire and position. It was noted to be in good length. The K wire was bent at the tip and cut using wire cutter. The incision site was irrigated. The extensor tendon was repaired using 3-0 Vicryl. The skin was closed using 4-0 Prolene.   Attention was directed towards the right fifth toe. Previous incision site noted. Using #15 blade, skin incision was made over the previous incision site down to subcutaneous tissue. The tendon was cut transversly to expose the bone at the PIPJ. The head of proximal phalanx was noted to be resected from previous surgery. There was exotosis noted laterally and medially. Using bone rongeur and dental bur the exostosis was removed and rasped. Incision site was inspected more any more boney exostosis and none found. At this time the surgical site was irrigated with saline. Tendon was repaired using 3-0 Vicryl. Skin was closed using 4-0 Prolene.   Dry sterile dressing applied. The tourniquet was deflated. Capillary refill time was brisk to lesser toes. Patient was transferred to PACU with Vital signs stable.   Will be placed in surgical shoe with partial weightbearing to right heel only.

## 2024-05-11 ENCOUNTER — Other Ambulatory Visit: Payer: Self-pay

## 2024-05-11 ENCOUNTER — Emergency Department (HOSPITAL_COMMUNITY)

## 2024-05-11 ENCOUNTER — Emergency Department (HOSPITAL_COMMUNITY)
Admission: EM | Admit: 2024-05-11 | Discharge: 2024-05-11 | Disposition: A | Discharge status: Home / Self Care | Attending: Emergency Medicine | Admitting: Emergency Medicine

## 2024-05-11 DIAGNOSIS — T50905A Adverse effect of unspecified drugs, medicaments and biological substances, initial encounter: Secondary | ICD-10-CM | POA: Insufficient documentation

## 2024-05-11 DIAGNOSIS — T887XXA Unspecified adverse effect of drug or medicament, initial encounter: Secondary | ICD-10-CM | POA: Insufficient documentation

## 2024-05-11 DIAGNOSIS — E119 Type 2 diabetes mellitus without complications: Secondary | ICD-10-CM | POA: Diagnosis not present

## 2024-05-11 DIAGNOSIS — R4182 Altered mental status, unspecified: Secondary | ICD-10-CM | POA: Diagnosis present

## 2024-05-11 DIAGNOSIS — Z794 Long term (current) use of insulin: Secondary | ICD-10-CM | POA: Diagnosis not present

## 2024-05-11 DIAGNOSIS — F10920 Alcohol use, unspecified with intoxication, uncomplicated: Secondary | ICD-10-CM | POA: Diagnosis not present

## 2024-05-11 DIAGNOSIS — Z79899 Other long term (current) drug therapy: Secondary | ICD-10-CM

## 2024-05-11 DIAGNOSIS — Y905 Blood alcohol level of 100-119 mg/100 ml: Secondary | ICD-10-CM | POA: Insufficient documentation

## 2024-05-11 DIAGNOSIS — N189 Chronic kidney disease, unspecified: Secondary | ICD-10-CM | POA: Diagnosis not present

## 2024-05-11 DIAGNOSIS — Z7982 Long term (current) use of aspirin: Secondary | ICD-10-CM | POA: Diagnosis not present

## 2024-05-11 DIAGNOSIS — F10929 Alcohol use, unspecified with intoxication, unspecified: Secondary | ICD-10-CM

## 2024-05-11 LAB — URINALYSIS, W/ REFLEX TO CULTURE (INFECTION SUSPECTED)
Bilirubin Urine: NEGATIVE
Glucose, UA: 150 mg/dL — AB
Hgb urine dipstick: NEGATIVE
Ketones, ur: NEGATIVE mg/dL
Nitrite: NEGATIVE
Protein, ur: 30 mg/dL — AB
Specific Gravity, Urine: 1.008 (ref 1.005–1.030)
pH: 6 (ref 5.0–8.0)

## 2024-05-11 LAB — COMPREHENSIVE METABOLIC PANEL WITH GFR
ALT: 51 U/L — ABNORMAL HIGH (ref 0–44)
AST: 51 U/L — ABNORMAL HIGH (ref 15–41)
Albumin: 3.2 g/dL — ABNORMAL LOW (ref 3.5–5.0)
Alkaline Phosphatase: 84 U/L (ref 38–126)
Anion gap: 10 (ref 5–15)
BUN: 32 mg/dL — ABNORMAL HIGH (ref 8–23)
CO2: 20 mmol/L — ABNORMAL LOW (ref 22–32)
Calcium: 8.1 mg/dL — ABNORMAL LOW (ref 8.9–10.3)
Chloride: 111 mmol/L (ref 98–111)
Creatinine, Ser: 2.39 mg/dL — ABNORMAL HIGH (ref 0.44–1.00)
GFR, Estimated: 21 mL/min — ABNORMAL LOW (ref >60)
Glucose, Bld: 228 mg/dL — ABNORMAL HIGH (ref 70–99)
Potassium: 3.5 mmol/L (ref 3.5–5.1)
Sodium: 141 mmol/L (ref 135–145)
Total Bilirubin: 0.5 mg/dL (ref 0.0–1.2)
Total Protein: 5.7 g/dL — ABNORMAL LOW (ref 6.5–8.1)

## 2024-05-11 LAB — CBC WITH DIFFERENTIAL/PLATELET
Abs Immature Granulocytes: 0.01 K/uL (ref 0.00–0.07)
Basophils Absolute: 0 K/uL (ref 0.0–0.1)
Basophils Relative: 0 %
Eosinophils Absolute: 0.2 K/uL (ref 0.0–0.5)
Eosinophils Relative: 3 %
HCT: 29.7 % — ABNORMAL LOW (ref 36.0–46.0)
Hemoglobin: 9.5 g/dL — ABNORMAL LOW (ref 12.0–15.0)
Immature Granulocytes: 0 %
Lymphocytes Relative: 43 %
Lymphs Abs: 2.5 K/uL (ref 0.7–4.0)
MCH: 32 pg (ref 26.0–34.0)
MCHC: 32 g/dL (ref 30.0–36.0)
MCV: 100 fL (ref 80.0–100.0)
Monocytes Absolute: 0.5 K/uL (ref 0.1–1.0)
Monocytes Relative: 9 %
Neutro Abs: 2.7 K/uL (ref 1.7–7.7)
Neutrophils Relative %: 45 %
Platelets: 128 K/uL — ABNORMAL LOW (ref 150–400)
RBC: 2.97 MIL/uL — ABNORMAL LOW (ref 3.87–5.11)
RDW: 12.1 % (ref 11.5–15.5)
WBC: 5.8 K/uL (ref 4.0–10.5)
nRBC: 0 % (ref 0.0–0.2)

## 2024-05-11 LAB — RAPID URINE DRUG SCREEN, HOSP PERFORMED
Amphetamines: NOT DETECTED
Barbiturates: NOT DETECTED
Benzodiazepines: POSITIVE — AB
Cocaine: NOT DETECTED
Opiates: POSITIVE — AB
Tetrahydrocannabinol: NOT DETECTED

## 2024-05-11 LAB — TROPONIN I (HIGH SENSITIVITY)
Troponin I (High Sensitivity): 4 ng/L (ref <18)
Troponin I (High Sensitivity): 5 ng/L (ref <18)

## 2024-05-11 LAB — GLUCOSE, CAPILLARY: Glucose-Capillary: 219 mg/dL — ABNORMAL HIGH (ref 70–99)

## 2024-05-11 LAB — ETHANOL: Alcohol, Ethyl (B): 109 mg/dL — ABNORMAL HIGH (ref <15)

## 2024-05-11 MED ORDER — NALOXONE HCL 0.4 MG/ML IJ SOLN
INTRAMUSCULAR | Status: AC
  Administered 2024-05-11: 0.4 mg via INTRAVENOUS
  Filled 2024-05-11: qty 1

## 2024-05-11 MED ORDER — LACTATED RINGERS IV BOLUS
1000.0000 mL | Freq: Once | INTRAVENOUS | Status: AC
  Administered 2024-05-11: 1000 mL via INTRAVENOUS

## 2024-05-11 MED ORDER — NALOXONE HCL 4 MG/0.1ML NA LIQD: 1.0000 | Freq: Once | NASAL | 0 refills | Status: AC | PRN

## 2024-05-11 MED ORDER — NALOXONE HCL 0.4 MG/ML IJ SOLN: 0.4000 mg | Freq: Once | INTRAMUSCULAR | Status: AC

## 2024-05-11 MED ORDER — CIPROFLOXACIN HCL 750 MG PO TABS
750.0000 mg | ORAL_TABLET | Freq: Every day | ORAL | 0 refills | Status: AC
Start: 2024-05-11 — End: 2024-05-16

## 2024-05-11 NOTE — ED Notes (Signed)
 Ride coming. Snack and drink given. Nad.

## 2024-05-11 NOTE — ED Notes (Signed)
 Pt talking coherently after narcan 

## 2024-05-11 NOTE — ED Notes (Signed)
 Post op shoe placed and sock for other foot. Pt ambulated. Was slightly unsteady at first, then steady with no help ambulating around room. A/o. States has a walker at home. Encouraged to use it. Pt stated she didn't mean to take so much medicine,she was just hurting so bad.

## 2024-05-11 NOTE — ED Notes (Signed)
 Received report from Melanie M. Pt resting/sleeping. Easily awakes. Stated I'm ready to go home. VSS.

## 2024-05-11 NOTE — Discharge Instructions (Addendum)
 We evaluated you for your episode of loss of consciousness.  We suspect that your episode was due to taking too much oxycodone  and mixing with alcohol.  Alcohol and opiate medication like oxycodone  should never be combined.  Please only take 5 mg of oxycodone  if absolutely necessary for pain not relieved by Tylenol .  We have prescribed you medication called Narcan  which your family member can spray in your nose if this were to happen again.  Your urine test did show signs of a possible UTI.  Since you are having some urinary symptoms we have prescribed you an antibiotic called Cipro .  Please take this as prescribed.  Please follow-up closely with your primary doctor and return if any new or worsening symptoms develop.

## 2024-05-11 NOTE — ED Provider Notes (Signed)
   ED Course / MDM   Clinical Course as of 05/11/24 0803  Thu May 11, 2024  0144 Patient still somnolent, pupils are small but not pinpoint. Will give a dose of narcan  to see if that helps. She recently had Rx for oxycodone  filled.  [CS]  0154 Patient more alert after narcan , does not remember what happened.  [CS]  0204 I personally viewed the images from radiology studies and agree with radiologist interpretation: CXR and head CT are neg for acute process.  [CS]  0204 CBC with mild anemia, similar to previous. Patient now more alert states she drank some tequila earlier tonight.  [CS]  0245 EtOH is elevated, consistent with history.  [CS]  0300 CMP with CKD at baseline, mildly elevated LFTs, no acidosis. Trop is neg.  [CS]  0348 UDS positive for opiates and benzos.  [CS]  0357 UA without infection.  [CS]  0414 Delta trop remains normal.  [CS]  0455 Patient sleeping comfortably. BP is soft while asleep, but improved when awake. Will monitor for sobering.  [CS]  [phone] Care of the patient will be signed out pending reassessment and readiness for discharge.  [CS]  0707 Received sign out from Dr. Roselyn pending re-assessment, found down after opioids and etoh [WS]  0755 Reassessed patient, she is is awake and requesting discharge.  She reports that she took 10 mg of oxycodone  instead of 5 and a shot of tequila.  She does not normally take oxycodone .  Recommended that she only take the minimum dosing of oxycodone  if at all and avoid taking alcohol when taking oxycodone .  Will prescribe Narcan .  Labs notable for sign of possible UTI, patient does report some urinary symptoms such as pain with urination and self caths so we will prescription for antibiotic. Urine last grew ecoli and morganella so will prescribe cipro . No flank pain or fevers. Will ambulate prior to discharge. Discussed return precautions with patient.  [WS]    Clinical Course User Index [CS] Roselyn Carlin NOVAK, MD [WS] Francesca Elsie CROME, MD   Medical Decision Making Amount and/or Complexity of Data Reviewed Labs: ordered. Radiology: ordered.  Risk Prescription drug management.      Francesca Elsie CROME, MD 05/11/24 [phone]

## 2024-05-11 NOTE — ED Provider Notes (Signed)
 [place] EMERGENCY DEPARTMENT AT [facility]  Provider Note  CSN: [id number] Arrival date & time: 05/11/24 0102  History Chief Complaint  Patient presents with   Loss of Consciousness    [name] is a 73 y.o. female with history of DM, had R hammer toe surgery on 8/27, brought by EMS for AMS and possible fall. She was apparently found unresponsive in her kitchen by husband. EMS reports she was hypotensive. Glucose mildly elevated. BP improved with IVF.    Home Medications Prior to Admission medications   Medication Sig Start Date End Date Taking? Authorizing Provider  amoxicillin -clavulanate (AUGMENTIN ) 875-125 MG tablet Take 1 tablet by mouth every 12 (twelve) hours. 05/02/24   Leath-Warren, Etta PARAS, NP  aspirin  EC 81 MG tablet Take 81 mg by mouth in the morning. Swallow whole.    [provider]  buPROPion  (WELLBUTRIN  XL) 300 MG 24 hr tablet Take 300 mg by mouth daily. 06/19/20   [provider]  calcitRIOL (ROCALTROL) 0.25 MCG capsule Take 0.25 mcg by mouth See admin instructions. Sunday, Wednesday and Friday    [provider]  carvedilol  (COREG ) 12.5 MG tablet Take 12.5 mg by mouth 2 (two) times daily. 11/16/22   [provider]  clonazePAM  (KLONOPIN ) 1 MG tablet Take 1 mg by mouth 3 (three) times daily as needed for anxiety.    [provider]  furosemide  (LASIX ) 40 MG tablet Take 40 mg by mouth every Monday, Wednesday, and Friday.    [provider]  gabapentin  (NEURONTIN ) 600 MG tablet Take 600 mg by mouth at bedtime.    [provider]  HYDROcodone -acetaminophen  (NORCO) 10-325 MG tablet Take 1 tablet by mouth in the morning and at bedtime.    [provider]  insulin  glargine (LANTUS ) 100 UNIT/ML injection Inject 3 Units into the skin in the morning.    [provider]  insulin  lispro (HUMALOG  KWIKPEN) 100 UNIT/ML KwikPen Inject 5-7 Units into the skin in the morning. Per sliding  scale    [provider]  losartan  (COZAAR ) 25 MG tablet Take 25 mg by mouth daily.    [provider]  Multiple Vitamins-Minerals (PRESERVISION AREDS PO) Take 1 capsule by mouth in the morning and at bedtime.    [provider]  nitrofurantoin  (MACRODANTIN ) 50 MG capsule Take 50 mg by mouth in the morning. 04/15/24   [provider]  omeprazole (PRILOSEC) 20 MG capsule Take 20 mg by mouth in the morning.    [provider]  OZEMPIC, 0.25 OR 0.5 MG/DOSE, 2 MG/3ML SOPN Inject 0.5 mg into the skin every Saturday. 02/03/23   [provider]  Perfluorohexyloctane (MIEBO) 1.338 GM/ML SOLN Place 1 drop into both eyes in the morning, at noon, and at bedtime.    [provider]  potassium chloride  SA (KLOR-CON  M) 20 MEQ tablet Take 20 mEq by mouth in the morning. 06/19/20   [provider]  Propylene Glycol (SYSTANE BALANCE) 0.6 % SOLN Place 1 drop into both eyes in the morning and at bedtime.    [provider]  rosuvastatin  (CRESTOR ) 40 MG tablet TAKE ONE (1) TABLET BY MOUTH EVERY DAY (STOP ATORVASTATIN ) 01/29/20   Corum, Olam LITTIE, MD  sertraline  (ZOLOFT ) 100 MG tablet Take 200 mg by mouth in the morning.    [provider]  sodium bicarbonate  650 MG tablet Take 650 mg by mouth in the morning and at bedtime.    [provider]  tiZANidine  (ZANAFLEX ) 4 MG tablet Take 4 mg by mouth at bedtime.    [provider]  zolpidem  (AMBIEN ) 10 MG tablet Take 10 mg by mouth at bedtime. 12/04/21   [provider]     Allergies    Patient has no known allergies.   Review of Systems   Review of Systems Please see HPI for pertinent positives and negatives  Physical Exam BP (!) 91/44   Pulse 77   Temp 98 F (36.7 C) (Oral)   Resp 19   SpO2 97%   Physical Exam Vitals and nursing note reviewed.  Constitutional:      Comments: somnolent  HENT:     Head: Normocephalic and atraumatic.     Nose: Nose  normal.     Mouth/Throat:     Mouth: Mucous membranes are moist.  Eyes:     Extraocular Movements: Extraocular movements intact.     Conjunctiva/sclera: Conjunctivae normal.  Cardiovascular:     Rate and Rhythm: Normal rate.  Pulmonary:     Effort: Pulmonary effort is normal.     Breath sounds: Normal breath sounds.  Abdominal:     General: Abdomen is flat.     Palpations: Abdomen is soft.     Tenderness: There is no abdominal tenderness.  Musculoskeletal:        General: No swelling. Normal range of motion.     Cervical back: Neck supple.     Comments: R toe surgery without complication  Skin:    General: Skin is warm and dry.  Neurological:     General: No focal deficit present.     Comments: Somnolent, difficult to fully assess, no obvious focal deficits.      ED Results / Procedures / Treatments   EKG None  Procedures Procedures  Medications Ordered in the ED Medications  lactated ringers  bolus 1,000 mL (0 mLs Intravenous Stopped 05/11/24 0549)  naloxone  (NARCAN ) injection 0.4 mg (0.4 mg Intravenous Given 05/11/24 0149)  lactated ringers  bolus 1,000 mL (1,000 mLs Intravenous New Bag/Given 05/11/24 0616)    Initial Impression and Plan  Patient here with AMS, ?fall, recent surgery. Glucose mildly elevated. Concern for DKA, ICH, sepsis, medication overdose. Will check labs, head CT and give IVF.   ED Course   Clinical Course as of 05/11/24 0701  Thu May 11, 2024  0144 Patient still somnolent, pupils are small but not pinpoint. Will give a dose of narcan  to see if that helps. She recently had Rx for oxycodone  filled.  [CS]  0154 Patient more alert after narcan , does not remember what happened.  [CS]  0204 I personally viewed the images from radiology studies and agree with radiologist interpretation: CXR and head CT are neg for acute process.  [CS]  0204 CBC with mild anemia, similar to previous. Patient now more alert states she drank some tequila earlier tonight.   [CS]  0245 EtOH is elevated, consistent with history.  [CS]  0300 CMP with CKD at baseline, mildly elevated LFTs, no acidosis. Trop is neg.  [CS]  0348 UDS positive for opiates and benzos.  [CS]  0357 UA without infection.  [CS]  0414 Delta trop remains normal.  [CS]  0455 Patient sleeping comfortably. BP is soft while asleep, but improved when awake. Will monitor for sobering.  [CS]  [phone] Care of the patient will be signed out pending reassessment and readiness for discharge.  [CS]    Clinical Course User Index [CS] Roselyn Carlin NOVAK, MD  MDM Rules/Calculators/A&P Medical Decision Making Problems Addressed: Alcoholic intoxication with complication [facility]): acute illness or injury Chronic kidney disease, unspecified CKD stage: chronic illness or injury Polypharmacy: undiagnosed new problem with uncertain prognosis  Amount and/or Complexity of Data Reviewed Labs: ordered. Decision-making details documented in ED Course. Radiology: ordered and independent interpretation performed. Decision-making details documented in ED Course. ECG/medicine tests: ordered and independent interpretation performed. Decision-making details documented in ED Course.  Risk Prescription drug management. Decision regarding hospitalization.     Final Clinical Impression(s) / ED Diagnoses Final diagnoses:  Polypharmacy  Alcoholic intoxication with complication (HCC)  Chronic kidney disease, unspecified CKD stage    Rx / DC Orders ED Discharge Orders     None        Roselyn Carlin NOVAK, MD 05/11/24 [phone]

## 2024-05-11 NOTE — ED Triage Notes (Addendum)
 Pt arrives via [facility], EMS states they were called out for LOC, upon their arrival pt was alert but found to be 70% on RA, BP 60/40. Pt placed on NRB. EMS gave 750 NS PTA. Pt had surgery yesterday.   CBG 219 83/55

## 2024-05-12 LAB — URINE CULTURE: Culture: NO GROWTH

## 2024-05-19 ENCOUNTER — Ambulatory Visit: Payer: Medicare Other

## 2024-05-19 DIAGNOSIS — I255 Ischemic cardiomyopathy: Secondary | ICD-10-CM

## 2024-05-20 LAB — CUP PACEART REMOTE DEVICE CHECK
Battery Remaining Longevity: 38 mo
Battery Voltage: 2.94 V
Brady Statistic AP VP Percent: 0.07 %
Brady Statistic AP VS Percent: 0.02 %
Brady Statistic AS VP Percent: 98.47 %
Brady Statistic AS VS Percent: 1.45 %
Brady Statistic RA Percent Paced: 0.1 %
Brady Statistic RV Percent Paced: 5.83 %
Date Time Interrogation Session: 20250905031437
Implantable Lead Connection Status: 753985
Implantable Lead Connection Status: 753985
Implantable Lead Connection Status: 753985
Implantable Lead Implant Date: 20060214
Implantable Lead Implant Date: 20150406
Implantable Lead Implant Date: 20150406
Implantable Lead Location: 753858
Implantable Lead Location: 753859
Implantable Lead Location: 753860
Implantable Lead Model: 4396
Implantable Lead Model: 5076
Implantable Pulse Generator Implant Date: 20210610
Lead Channel Impedance Value: 1311 Ohm
Lead Channel Impedance Value: 1444 Ohm
Lead Channel Impedance Value: 323 Ohm
Lead Channel Impedance Value: 399 Ohm
Lead Channel Impedance Value: 475 Ohm
Lead Channel Impedance Value: 551 Ohm
Lead Channel Impedance Value: 608 Ohm
Lead Channel Impedance Value: 798 Ohm
Lead Channel Impedance Value: 931 Ohm
Lead Channel Pacing Threshold Amplitude: 1.25 V
Lead Channel Pacing Threshold Amplitude: 1.875 V
Lead Channel Pacing Threshold Amplitude: 2.375 V
Lead Channel Pacing Threshold Pulse Width: 0.4 ms
Lead Channel Pacing Threshold Pulse Width: 0.4 ms
Lead Channel Pacing Threshold Pulse Width: 0.8 ms
Lead Channel Sensing Intrinsic Amplitude: 1.875 mV
Lead Channel Sensing Intrinsic Amplitude: 1.875 mV
Lead Channel Sensing Intrinsic Amplitude: 6.125 mV
Lead Channel Sensing Intrinsic Amplitude: 6.125 mV
Lead Channel Setting Pacing Amplitude: 2.5 V
Lead Channel Setting Pacing Amplitude: 3 V
Lead Channel Setting Pacing Amplitude: 3.75 V
Lead Channel Setting Pacing Pulse Width: 0.4 ms
Lead Channel Setting Pacing Pulse Width: 0.8 ms
Lead Channel Setting Sensing Sensitivity: 0.9 mV
Zone Setting Status: 755011

## 2024-05-21 ENCOUNTER — Ambulatory Visit: Payer: Self-pay | Admitting: Internal Medicine

## 2024-06-01 NOTE — Progress Notes (Signed)
 Remote PPM Transmission

## 2024-06-28 ENCOUNTER — Encounter (INDEPENDENT_AMBULATORY_CARE_PROVIDER_SITE_OTHER): Payer: Self-pay | Admitting: Gastroenterology

## 2024-08-18 DIAGNOSIS — I255 Ischemic cardiomyopathy: Secondary | ICD-10-CM

## 2024-08-21 LAB — CUP PACEART REMOTE DEVICE CHECK
Battery Remaining Longevity: 32 mo
Battery Voltage: 2.93 V
Brady Statistic AP VP Percent: 0.05 %
Brady Statistic AP VS Percent: 0.02 %
Brady Statistic AS VP Percent: 98.42 %
Brady Statistic AS VS Percent: 1.51 %
Brady Statistic RA Percent Paced: 0.08 %
Brady Statistic RV Percent Paced: 5.87 %
Date Time Interrogation Session: 20251205021538
Implantable Lead Connection Status: 753985
Implantable Lead Connection Status: 753985
Implantable Lead Connection Status: 753985
Implantable Lead Implant Date: 20060214
Implantable Lead Implant Date: 20150406
Implantable Lead Implant Date: 20150406
Implantable Lead Location: 753858
Implantable Lead Location: 753859
Implantable Lead Location: 753860
Implantable Lead Model: 4396
Implantable Lead Model: 5076
Implantable Pulse Generator Implant Date: 20210610
Lead Channel Impedance Value: 1292 Ohm
Lead Channel Impedance Value: 1425 Ohm
Lead Channel Impedance Value: 342 Ohm
Lead Channel Impedance Value: 437 Ohm
Lead Channel Impedance Value: 475 Ohm
Lead Channel Impedance Value: 551 Ohm
Lead Channel Impedance Value: 608 Ohm
Lead Channel Impedance Value: 817 Ohm
Lead Channel Impedance Value: 950 Ohm
Lead Channel Pacing Threshold Amplitude: 1.25 V
Lead Channel Pacing Threshold Amplitude: 1.5 V
Lead Channel Pacing Threshold Amplitude: 2.25 V
Lead Channel Pacing Threshold Pulse Width: 0.4 ms
Lead Channel Pacing Threshold Pulse Width: 0.4 ms
Lead Channel Pacing Threshold Pulse Width: 0.8 ms
Lead Channel Sensing Intrinsic Amplitude: 2.625 mV
Lead Channel Sensing Intrinsic Amplitude: 2.625 mV
Lead Channel Sensing Intrinsic Amplitude: 5.25 mV
Lead Channel Sensing Intrinsic Amplitude: 5.25 mV
Lead Channel Setting Pacing Amplitude: 2.5 V
Lead Channel Setting Pacing Amplitude: 3.25 V
Lead Channel Setting Pacing Amplitude: 3.25 V
Lead Channel Setting Pacing Pulse Width: 0.4 ms
Lead Channel Setting Pacing Pulse Width: 0.8 ms
Lead Channel Setting Sensing Sensitivity: 0.9 mV
Zone Setting Status: 755011

## 2024-08-22 NOTE — Progress Notes (Signed)
 Remote PPM Transmission

## 2024-08-23 ENCOUNTER — Ambulatory Visit: Payer: Self-pay | Admitting: Internal Medicine

## 2024-09-26 ENCOUNTER — Encounter (INDEPENDENT_AMBULATORY_CARE_PROVIDER_SITE_OTHER): Payer: Self-pay | Admitting: *Deleted

## 2024-10-03 ENCOUNTER — Other Ambulatory Visit (HOSPITAL_COMMUNITY): Payer: Self-pay | Admitting: Internal Medicine

## 2024-10-03 DIAGNOSIS — Z1231 Encounter for screening mammogram for malignant neoplasm of breast: Secondary | ICD-10-CM

## 2024-10-13 ENCOUNTER — Ambulatory Visit: Admitting: Urology

## 2024-10-18 ENCOUNTER — Encounter (INDEPENDENT_AMBULATORY_CARE_PROVIDER_SITE_OTHER): Admitting: Gastroenterology

## 2024-10-19 ENCOUNTER — Encounter (INDEPENDENT_AMBULATORY_CARE_PROVIDER_SITE_OTHER): Payer: Self-pay | Admitting: *Deleted

## 2024-11-01 ENCOUNTER — Ambulatory Visit (HOSPITAL_COMMUNITY)

## 2024-11-08 ENCOUNTER — Ambulatory Visit: Admitting: Urology

## 2024-11-17 ENCOUNTER — Ambulatory Visit

## 2024-12-19 ENCOUNTER — Ambulatory Visit: Admitting: Cardiology

## 2025-02-16 ENCOUNTER — Ambulatory Visit

## 2025-05-18 ENCOUNTER — Ambulatory Visit

## 2025-08-17 ENCOUNTER — Ambulatory Visit

## 2025-11-16 ENCOUNTER — Ambulatory Visit
